# Patient Record
Sex: Female | Born: 1940 | Race: Black or African American | Hispanic: No
Health system: Midwestern US, Community
[De-identification: ages and names within clinical notes are randomized; demographics above are authoritative.]

## PROBLEM LIST (undated history)

## (undated) DIAGNOSIS — K429 Umbilical hernia without obstruction or gangrene: Secondary | ICD-10-CM

## (undated) DIAGNOSIS — I10 Essential (primary) hypertension: Secondary | ICD-10-CM

## (undated) DIAGNOSIS — E1165 Type 2 diabetes mellitus with hyperglycemia: Secondary | ICD-10-CM

## (undated) DIAGNOSIS — E119 Type 2 diabetes mellitus without complications: Secondary | ICD-10-CM

## (undated) DIAGNOSIS — G894 Chronic pain syndrome: Secondary | ICD-10-CM

## (undated) DIAGNOSIS — IMO0002 Reserved for concepts with insufficient information to code with codable children: Secondary | ICD-10-CM

## (undated) DIAGNOSIS — L98499 Non-pressure chronic ulcer of skin of other sites with unspecified severity: Secondary | ICD-10-CM

## (undated) DIAGNOSIS — R809 Proteinuria, unspecified: Secondary | ICD-10-CM

## (undated) DIAGNOSIS — E041 Nontoxic single thyroid nodule: Secondary | ICD-10-CM

## (undated) DIAGNOSIS — K219 Gastro-esophageal reflux disease without esophagitis: Secondary | ICD-10-CM

## (undated) DIAGNOSIS — I2699 Other pulmonary embolism without acute cor pulmonale: Secondary | ICD-10-CM

## (undated) DIAGNOSIS — R21 Rash and other nonspecific skin eruption: Secondary | ICD-10-CM

## (undated) DIAGNOSIS — Z1231 Encounter for screening mammogram for malignant neoplasm of breast: Secondary | ICD-10-CM

## (undated) DIAGNOSIS — I272 Pulmonary hypertension, unspecified: Secondary | ICD-10-CM

## (undated) DIAGNOSIS — Z1211 Encounter for screening for malignant neoplasm of colon: Secondary | ICD-10-CM

## (undated) DIAGNOSIS — M5430 Sciatica, unspecified side: Secondary | ICD-10-CM

## (undated) DIAGNOSIS — J449 Chronic obstructive pulmonary disease, unspecified: Secondary | ICD-10-CM

## (undated) DIAGNOSIS — R109 Unspecified abdominal pain: Secondary | ICD-10-CM

## (undated) DIAGNOSIS — M899 Disorder of bone, unspecified: Secondary | ICD-10-CM

## (undated) DIAGNOSIS — E042 Nontoxic multinodular goiter: Secondary | ICD-10-CM

## (undated) DIAGNOSIS — M810 Age-related osteoporosis without current pathological fracture: Secondary | ICD-10-CM

## (undated) DIAGNOSIS — M25551 Pain in right hip: Secondary | ICD-10-CM

## (undated) DIAGNOSIS — M949 Disorder of cartilage, unspecified: Secondary | ICD-10-CM

## (undated) DIAGNOSIS — R591 Generalized enlarged lymph nodes: Secondary | ICD-10-CM

## (undated) DIAGNOSIS — B372 Candidiasis of skin and nail: Secondary | ICD-10-CM

## (undated) DIAGNOSIS — R52 Pain, unspecified: Secondary | ICD-10-CM

## (undated) DIAGNOSIS — G8929 Other chronic pain: Secondary | ICD-10-CM

## (undated) DIAGNOSIS — R928 Other abnormal and inconclusive findings on diagnostic imaging of breast: Secondary | ICD-10-CM

## (undated) DIAGNOSIS — E11622 Type 2 diabetes mellitus with other skin ulcer: Secondary | ICD-10-CM

## (undated) DIAGNOSIS — R0600 Dyspnea, unspecified: Secondary | ICD-10-CM

## (undated) DIAGNOSIS — D649 Anemia, unspecified: Secondary | ICD-10-CM

## (undated) DIAGNOSIS — J189 Pneumonia, unspecified organism: Secondary | ICD-10-CM

## (undated) DIAGNOSIS — M1990 Unspecified osteoarthritis, unspecified site: Secondary | ICD-10-CM

## (undated) DIAGNOSIS — M419 Scoliosis, unspecified: Secondary | ICD-10-CM

## (undated) DIAGNOSIS — E785 Hyperlipidemia, unspecified: Secondary | ICD-10-CM

## (undated) DIAGNOSIS — Z8719 Personal history of other diseases of the digestive system: Secondary | ICD-10-CM

## (undated) DIAGNOSIS — Z8619 Personal history of other infectious and parasitic diseases: Secondary | ICD-10-CM

## (undated) DIAGNOSIS — J45909 Unspecified asthma, uncomplicated: Secondary | ICD-10-CM

## (undated) DIAGNOSIS — Z8669 Personal history of other diseases of the nervous system and sense organs: Secondary | ICD-10-CM

## (undated) DIAGNOSIS — G43909 Migraine, unspecified, not intractable, without status migrainosus: Secondary | ICD-10-CM

## (undated) DIAGNOSIS — M79671 Pain in right foot: Secondary | ICD-10-CM

## (undated) DIAGNOSIS — I890 Lymphedema, not elsewhere classified: Secondary | ICD-10-CM

## (undated) HISTORY — DX: Essential (primary) hypertension: I10

## (undated) HISTORY — DX: Migraine, unspecified, not intractable, without status migrainosus: G43.909

## (undated) HISTORY — DX: Personal history of other infectious and parasitic diseases: Z86.19

## (undated) HISTORY — PX: TONSILLECTOMY: SUR1361

## (undated) HISTORY — DX: Personal history of other diseases of the nervous system and sense organs: Z86.69

## (undated) HISTORY — DX: Hyperlipidemia, unspecified: E78.5

## (undated) HISTORY — PX: VESICOVAGINAL FISTULA CLOSURE W/ TAH: SUR271

## (undated) HISTORY — DX: Unspecified asthma, uncomplicated: J45.909

## (undated) HISTORY — DX: Scoliosis, unspecified: M41.9

## (undated) MED ORDER — DICLOFENAC 75 MG TAB, DELAYED RELEASE
75 mg | ORAL_TABLET | ORAL | Status: DC
Start: ? — End: 2012-07-25

## (undated) MED ORDER — LORATADINE 10 MG TAB
10 mg | ORAL_TABLET | Freq: Every day | ORAL | Status: DC
Start: ? — End: 2012-12-04

## (undated) MED ORDER — IPRATROPIUM-ALBUTEROL 20 MCG-100 MCG/ACTUATION AEROSOL INHALER
20-100 mcg/actuation | Freq: Four times a day (QID) | RESPIRATORY_TRACT | Status: DC
Start: ? — End: 2012-05-29

## (undated) MED ORDER — JANUMET 50 MG-1,000 MG TABLET
50-1000 mg | ORAL_TABLET | ORAL | Status: DC
Start: ? — End: 2013-04-02

## (undated) MED ORDER — DICLOFENAC 75 MG TAB, DELAYED RELEASE
75 mg | ORAL_TABLET | ORAL | Status: DC
Start: ? — End: 2012-10-11

## (undated) MED ORDER — JANUMET 50 MG-1,000 MG TABLET
50-1000 mg | ORAL_TABLET | ORAL | Status: DC
Start: ? — End: 2012-05-29

## (undated) MED ORDER — IPRATROPIUM-ALBUTEROL 20 MCG-100 MCG/ACTUATION AEROSOL INHALER
20-100 mcg/actuation | Freq: Four times a day (QID) | RESPIRATORY_TRACT | Status: DC
Start: ? — End: 2013-11-11

## (undated) MED ORDER — GABAPENTIN 100 MG CAP
100 mg | ORAL_CAPSULE | Freq: Three times a day (TID) | ORAL | Status: DC
Start: ? — End: 2012-10-28

## (undated) MED ORDER — DICLOFENAC 75 MG TAB, DELAYED RELEASE
75 mg | ORAL_TABLET | ORAL | Status: DC
Start: ? — End: 2013-04-29

---

## 1997-12-29 ENCOUNTER — Other Ambulatory Visit: Admission: RE | Admit: 1997-12-29 | Discharge: 1997-12-29 | Payer: Self-pay | Admitting: *Deleted

## 1998-09-09 ENCOUNTER — Encounter: Payer: Self-pay | Admitting: *Deleted

## 1998-09-10 ENCOUNTER — Inpatient Hospital Stay (HOSPITAL_COMMUNITY): Admission: EM | Admit: 1998-09-10 | Discharge: 1998-09-15 | Payer: Self-pay | Admitting: *Deleted

## 1998-09-14 ENCOUNTER — Encounter: Payer: Self-pay | Admitting: Cardiology

## 1998-12-13 ENCOUNTER — Other Ambulatory Visit: Admission: RE | Admit: 1998-12-13 | Discharge: 1998-12-13 | Payer: Self-pay | Admitting: *Deleted

## 2000-01-24 ENCOUNTER — Other Ambulatory Visit: Admission: RE | Admit: 2000-01-24 | Discharge: 2000-01-24 | Payer: Self-pay | Admitting: *Deleted

## 2000-05-01 ENCOUNTER — Ambulatory Visit (HOSPITAL_COMMUNITY): Admission: RE | Admit: 2000-05-01 | Discharge: 2000-05-01 | Payer: Self-pay | Admitting: *Deleted

## 2001-04-09 ENCOUNTER — Other Ambulatory Visit: Admission: RE | Admit: 2001-04-09 | Discharge: 2001-04-09 | Payer: Self-pay | Admitting: *Deleted

## 2002-04-15 ENCOUNTER — Other Ambulatory Visit: Admission: RE | Admit: 2002-04-15 | Discharge: 2002-04-15 | Payer: Self-pay | Admitting: *Deleted

## 2002-07-16 ENCOUNTER — Emergency Department (HOSPITAL_COMMUNITY): Admission: EM | Admit: 2002-07-16 | Discharge: 2002-07-16 | Payer: Self-pay | Admitting: Emergency Medicine

## 2002-07-16 ENCOUNTER — Encounter: Payer: Self-pay | Admitting: Emergency Medicine

## 2003-04-21 ENCOUNTER — Other Ambulatory Visit: Admission: RE | Admit: 2003-04-21 | Discharge: 2003-04-21 | Payer: Self-pay | Admitting: *Deleted

## 2004-06-11 ENCOUNTER — Emergency Department (HOSPITAL_COMMUNITY): Admission: EM | Admit: 2004-06-11 | Discharge: 2004-06-12 | Payer: Self-pay | Admitting: Emergency Medicine

## 2005-05-24 ENCOUNTER — Ambulatory Visit (HOSPITAL_COMMUNITY): Admission: RE | Admit: 2005-05-24 | Discharge: 2005-05-24 | Payer: Self-pay | Admitting: *Deleted

## 2006-01-04 ENCOUNTER — Other Ambulatory Visit: Admission: RE | Admit: 2006-01-04 | Discharge: 2006-01-04 | Payer: Self-pay | Admitting: *Deleted

## 2006-07-18 ENCOUNTER — Encounter: Admission: RE | Admit: 2006-07-18 | Discharge: 2006-07-18 | Payer: Self-pay | Admitting: Internal Medicine

## 2007-09-09 ENCOUNTER — Encounter: Admission: RE | Admit: 2007-09-09 | Discharge: 2007-09-09 | Payer: Self-pay | Admitting: Internal Medicine

## 2008-01-03 NOTE — Op Note (Signed)
Name: Allison Newman, ADAN  MR #: [id number] Surgeon: Cristine Polio,   MD  Account #: [id number] Surgery Date: 01/03/2008  DOB: [birth year]  Age: 67 Location:     OPERATIVE REPORT        PREOPERATIVE DIAGNOSIS: Polyp.    POSTOPERATIVE DIAGNOSES:  1. Transverse colon polyp.  2. Internal hemorrhoids.    PROCEDURES PERFORMED: Colonoscopy and excisional polypectomy.    ANESTHESIA RISK: 2.    AIRWAY CLASS: No airway problems anticipated. H and P completed prior to  procedure.    MEDICATIONS:  1. Fentanyl 100 mcg.  2. Versed 4 mg.    MONITORING: Conscious anesthesia was monitored including O2 saturation,  blood pressure, airway, and cardiac rhythm by the endoscopy nurse and  physician. There were no complications. Note was made of unifocal PVCs.    INDICATION: The patient desired to exclude significant colon pathology.    CONSENT: The patient was advised of the risks including perforation of the  colon requiring emergent surgery to correct, benefits, and alternatives.  The patient signed the informed consent and agreed to proceed.    DESCRIPTION OF PROCEDURE: The patient was placed in left lateral decubitus  position. Rectal examination was unremarkable. Under direct  visualization, the Olympus CFQ-160 video colonoscope was passed slowly,  advanced to the cecum, from the rectum to the cecum. In the cecum, a large  amount stool was present. The ileocecal valve was unremarkable.  Withdrawal of the scope revealed a tiny polyp in the transverse colon,  which was excisional biopsied. A large amount of stool throughout the  entire portion of the colon examined, making finding mucosal detail more  difficult. Retroflexion of the scope revealed internal hemorrhoids. No  other mucosal lesions, masses, or polyps were seen. The insufflated air  was removed. The scope was removed. Postprocedure abdominal examination  was benign. The patient tolerated the procedure well and was taken to  recovery area in satisfactory condition.     RECOMMENDATIONS: Repeat colonoscopy in 5 years unless clinically indicated  with a 2-day prep.              E-Signed By  Cristine Polio, MD 01/08/2008 09:06  Cristine Polio, MD    cc: Cristine Polio, MD        REH/FI4; D: 01/03/2008 11:35 A; T: 01/03/2008 7:50 P; Doc# [id number]; Job#  [id number]

## 2008-01-03 NOTE — Op Note (Signed)
Op Notes signed by  at 01/08/08 0906                  Author: Carol Ada, MD  Service: --  Author Type: Physician            Filed:   Date of Service: 01/03/08 1950  Status: Signed          <!--EPICS--> Name:      Allison Newman, Allison Newman<BR> MR #:      [id number]                    Surgeon:        Cristine Polio, <BR> MD<BR> Account #:  [id number]                 Surgery Date:   01/03/2008<BR> DOB:       1940/05/05<BR> Age:       67                           Location:<BR> <BR>                              OPERATIVE REPORT<BR> <BR> <BR> <BR> PREOPERATIVE DIAGNOSIS:  Polyp.<BR> <BR>  POSTOPERATIVE DIAGNOSES:<BR> 1.     Transverse colon polyp.<BR> 2.     Internal hemorrhoids.<BR> <BR> PROCEDURES PERFORMED:  Colonoscopy and excisional polypectomy.<BR> <BR> ANESTHESIA RISK:  2.<BR> <BR> AIRWAY CLASS:  No airway problems anticipated.   H and P completed prior to<BR> procedure.<BR> <BR> MEDICATIONS:<BR> 1.     Fentanyl 100 mcg.<BR> 2.     Versed 4 mg.<BR> <BR> MONITORING:  Conscious anesthesia was monitored including O2 saturation,<BR> blood pressure, airway, and cardiac rhythm by the  endoscopy nurse and<BR> physician.  There were no complications.  Note was made of unifocal PVCs.<BR> <BR> INDICATION:  The patient desired to exclude significant colon pathology.<BR> <BR> CONSENT:  The patient was advised of the risks including perforation  of the<BR> colon requiring emergent surgery to correct, benefits, and alternatives.<BR> The patient signed the informed consent and agreed to proceed.<BR> <BR> DESCRIPTION OF PROCEDURE:  The patient was placed in left lateral decubitus<BR> position.   Rectal examination was unremarkable.  Under direct<BR> visualization, the Olympus CFQ-160 video colonoscope was passed slowly,<BR> advanced to the cecum, from the rectum to the cecum.  In the cecum, a large<BR> amount stool was present.  The ileocecal  valve was unremarkable.<BR> Withdrawal of the scope revealed a tiny polyp  in the transverse colon,<BR> which was excisional biopsied.  A large amount of stool throughout the<BR> entire portion of the colon examined, making finding mucosal detail more<BR>  difficult.  Retroflexion of the scope revealed internal hemorrhoids.  No<BR> other mucosal lesions, masses, or polyps were seen.  The insufflated air<BR> was removed.  The scope was removed.  Postprocedure abdominal examination<BR> was benign.  The patient  tolerated the procedure well and was taken to<BR> recovery area in satisfactory condition.<BR> <BR> RECOMMENDATIONS:  Repeat colonoscopy in 5 years unless clinically indicated<BR> with a 2-day prep.<BR> <BR> <BR> <BR> <BR> <BR> <BR> E-Signed By<BR> Cristine Polio, MD 01/08/2008 09:06<BR> Patrecia Veiga E Mason Burleigh, MD<BR> <BR> cc:   Desmond Dike Pantera Winterrowd, MD<BR> <BR> <BR> <BR> REH/FI4; D: 01/03/2008 11:35 A; T: 01/03/2008  7:50 P; Doc# [id number]; Job#<BR> [id number]<NA> <!--EPICE-->

## 2008-01-07 LAB — GLUCOSE, POC: Glucose (POC): 143 mg/dL — ABNORMAL HIGH (ref 65–105)

## 2009-11-01 ENCOUNTER — Encounter: Admission: RE | Admit: 2009-11-01 | Discharge: 2009-11-01 | Payer: Self-pay | Admitting: Internal Medicine

## 2009-11-02 ENCOUNTER — Encounter: Admission: RE | Admit: 2009-11-02 | Discharge: 2009-11-02 | Payer: Self-pay | Admitting: Internal Medicine

## 2010-04-27 ENCOUNTER — Emergency Department (HOSPITAL_COMMUNITY): Payer: Medicare Other

## 2010-04-27 ENCOUNTER — Observation Stay (HOSPITAL_COMMUNITY)
Admission: EM | Admit: 2010-04-27 | Discharge: 2010-04-29 | Disposition: A | Discharge status: Home / Self Care | Payer: Medicare Other | Attending: Internal Medicine | Admitting: Internal Medicine

## 2010-04-27 DIAGNOSIS — Z7902 Long term (current) use of antithrombotics/antiplatelets: Secondary | ICD-10-CM | POA: Insufficient documentation

## 2010-04-27 DIAGNOSIS — Z7982 Long term (current) use of aspirin: Secondary | ICD-10-CM | POA: Insufficient documentation

## 2010-04-27 DIAGNOSIS — I10 Essential (primary) hypertension: Secondary | ICD-10-CM | POA: Insufficient documentation

## 2010-04-27 DIAGNOSIS — E119 Type 2 diabetes mellitus without complications: Secondary | ICD-10-CM | POA: Insufficient documentation

## 2010-04-27 DIAGNOSIS — G459 Transient cerebral ischemic attack, unspecified: Secondary | ICD-10-CM | POA: Insufficient documentation

## 2010-04-27 DIAGNOSIS — R209 Unspecified disturbances of skin sensation: Secondary | ICD-10-CM | POA: Insufficient documentation

## 2010-04-27 DIAGNOSIS — E7800 Pure hypercholesterolemia, unspecified: Secondary | ICD-10-CM | POA: Insufficient documentation

## 2010-04-27 DIAGNOSIS — R079 Chest pain, unspecified: Secondary | ICD-10-CM | POA: Insufficient documentation

## 2010-04-27 DIAGNOSIS — R42 Dizziness and giddiness: Principal | ICD-10-CM | POA: Insufficient documentation

## 2010-04-27 LAB — DIFFERENTIAL
Basophils Absolute: 0 10*3/uL (ref 0.0–0.1)
Basophils Relative: 0 % (ref 0–1)
Eosinophils Absolute: 0.1 10*3/uL (ref 0.0–0.7)
Eosinophils Relative: 1 % (ref 0–5)
Lymphocytes Relative: 23 % (ref 12–46)
Lymphs Abs: 2.1 10*3/uL (ref 0.7–4.0)
Monocytes Absolute: 0.5 10*3/uL (ref 0.1–1.0)
Monocytes Relative: 6 % (ref 3–12)
Neutro Abs: 6.5 10*3/uL (ref 1.7–7.7)
Neutrophils Relative %: 70 % (ref 43–77)

## 2010-04-27 LAB — COMPREHENSIVE METABOLIC PANEL
ALT: 12 U/L (ref 0–35)
AST: 22 U/L (ref 0–37)
Albumin: 3.6 g/dL (ref 3.5–5.2)
Alkaline Phosphatase: 64 U/L (ref 39–117)
BUN: 22 mg/dL (ref 6–23)
CO2: 34 mEq/L — ABNORMAL HIGH (ref 19–32)
Calcium: 10 mg/dL (ref 8.4–10.5)
Chloride: 99 mEq/L (ref 96–112)
Creatinine, Ser: 1.09 mg/dL (ref 0.4–1.2)
GFR calc Af Amer: >60 mL/min (ref >60)
GFR calc non Af Amer: 50 mL/min — ABNORMAL LOW (ref >60)
Glucose, Bld: 90 mg/dL (ref 70–99)
Potassium: 4.2 mEq/L (ref 3.5–5.1)
Sodium: 141 mEq/L (ref 135–145)
Total Bilirubin: 0.9 mg/dL (ref 0.3–1.2)
Total Protein: 7.6 g/dL (ref 6.0–8.3)

## 2010-04-27 LAB — BASIC METABOLIC PANEL
BUN: 21 mg/dL (ref 6–23)
CO2: 33 mEq/L — ABNORMAL HIGH (ref 19–32)
Calcium: 10.1 mg/dL (ref 8.4–10.5)
Chloride: 97 mEq/L (ref 96–112)
Creatinine, Ser: 1.12 mg/dL (ref 0.4–1.2)
GFR calc Af Amer: 59 mL/min — ABNORMAL LOW (ref >60)
GFR calc non Af Amer: 49 mL/min — ABNORMAL LOW (ref >60)
Glucose, Bld: 95 mg/dL (ref 70–99)
Potassium: 3.2 mEq/L — ABNORMAL LOW (ref 3.5–5.1)
Sodium: 141 mEq/L (ref 135–145)

## 2010-04-27 LAB — CBC
HCT: 39.9 % (ref 36.0–46.0)
Hemoglobin: 12.7 g/dL (ref 12.0–15.0)
MCH: 23 pg — ABNORMAL LOW (ref 26.0–34.0)
MCHC: 31.8 g/dL (ref 30.0–36.0)
MCV: 72.4 fL — ABNORMAL LOW (ref 78.0–100.0)
Platelets: 188 10*3/uL (ref 150–400)
RBC: 5.51 MIL/uL — ABNORMAL HIGH (ref 3.87–5.11)
RDW: 14 % (ref 11.5–15.5)
WBC: 9.2 10*3/uL (ref 4.0–10.5)

## 2010-04-27 LAB — PROTIME-INR
INR: 0.99 (ref 0.00–1.49)
Prothrombin Time: 13.3 seconds (ref 11.6–15.2)

## 2010-04-27 LAB — TROPONIN I: Troponin I: 0.02 ng/mL (ref 0.00–0.06)

## 2010-04-27 LAB — CK TOTAL AND CKMB (NOT AT ARMC)
CK, MB: 1.6 ng/mL (ref 0.3–4.0)
Relative Index: 1.5 (ref 0.0–2.5)
Total CK: 107 U/L (ref 7–177)

## 2010-04-27 LAB — APTT: aPTT: 26 seconds (ref 24–37)

## 2010-04-27 LAB — HEMOGLOBIN A1C
Hgb A1c MFr Bld: 6.3 % — ABNORMAL HIGH (ref <5.7)
Mean Plasma Glucose: 134 mg/dL — ABNORMAL HIGH (ref <117)

## 2010-04-28 ENCOUNTER — Observation Stay (HOSPITAL_COMMUNITY): Payer: Medicare Other

## 2010-04-28 DIAGNOSIS — G459 Transient cerebral ischemic attack, unspecified: Secondary | ICD-10-CM

## 2010-04-28 DIAGNOSIS — I369 Nonrheumatic tricuspid valve disorder, unspecified: Secondary | ICD-10-CM

## 2010-04-28 LAB — LIPID PANEL
Cholesterol: 124 mg/dL (ref 0–200)
HDL: 39 mg/dL — ABNORMAL LOW (ref >39)
LDL Cholesterol: 69 mg/dL (ref 0–99)
Total CHOL/HDL Ratio: 3.2 RATIO
Triglycerides: 81 mg/dL (ref <150)
VLDL: 16 mg/dL (ref 0–40)

## 2010-04-30 NOTE — H&P (Signed)
NAME:  Margaret Gordon, Margaret Gordon NO.:  [id number]  MEDICAL RECORD NO.:  [id number]           PATIENT TYPE:  E  LOCATION:  [facility]                         FACILITY:  [facility]  PHYSICIAN:  Vania Rea, M.D. DATE OF BIRTH:  21-May-1940  DATE OF ADMISSION:  04/27/2010 DATE OF DISCHARGE:                             HISTORY & PHYSICAL   PRIMARY CARE PHYSICIAN:  Deirdre Peer. Polite, MD  CHIEF COMPLAINT:  Numbness and tingling of the right arm and chest pain episodically for the past 2 weeks.  HISTORY OF PRESENT ILLNESS:  This is a 70 year old retired [place] American lady who is very active in various activities in her church and has been listening to television programs recently about [employer] and recognized some of the symptoms she is hearing about on television. Symptoms she has been having as possible heart disease.  The patient says she has been having episodic chest pains on and off, episodic shortness of breath and when she heard the mentioned yesterday that heart disease can sometimes be caused by tingling of the right arm, numbness and tingling of the right arm in women, she realized that she has been having tingling of the right arm on and off since yesterday and decided to come to the emergency room today.  In the emergency room today, the patient had an episode of again tingling of the right arm and also experienced something that she described as tingling of the right side of her face and felt as if her face was leaning toward the left. She also had an episode of tingling of the right shoulder.  She was seen and evaluated by the emergency room physician.  No acute abnormalities were found.  The hospitalist service was called to assist with management to rule out a possible TIA.  The patient's husband and other family members are at the bedside and they described her as being extremely stressed and overworking herself. When the patient was told that we will be  investigating the symptoms thoroughly, but it seems unlikely to be related to a heart attack or stroke, she became very nervous and very tearful and had to be reassured.  Family indicated they think the best thing for her would be to take away her cell phone and allow her to get some rest.  The patient denies any headache.  She denies any diaphoresis.  She denies any lower extremity edema.  She reports that she takes Lasix from her primary care physician because of an episode of an abnormal chest x- ray that was felt to be fluid on her chest and that recently her dose of Lasix has been adjusted downwards.  She denies any syncope.  PAST MEDICAL HISTORY:  Hypertension, hyperlipidemia.  MEDICATIONS:  At bedtime medications are for hypertension, she cannot remember the name. 1. Aspirin 81 mg daily. 2. Lasix 20 mg daily. 3. Lipitor unknown dose daily.  PAST SURGICAL HISTORY:  Hysterectomy and tonsillectomy.  ALLERGIES:  No known drug allergies.  SOCIAL HISTORY:  Denies tobacco, alcohol, or illicit drug use.  Lives with her husband, very active in her church and in the local [place]  woman's association.  She is a retired from [employer] as a Engineer, drilling.  FAMILY HISTORY:  Significant for a mother who had diabetes, congestive heart failure, breast cancer.  Brother with diabetes.  Sister was deceased had diabetes and stroke.  REVIEW OF SYSTEMS:  Other than noted above unremarkable.  PHYSICAL EXAMINATION:  GENERAL:  Anxious but very pleasant middle-aged [place] American lady, looks younger than her stated age. VITAL SIGNS:  Temperature is 97.9.  Her pulse is 73, respiration 20, blood pressure 102/63, saturating 100% on room air. HEENT:  Her pupils are round, equal, and reactive.  Mucous membranes pink.  Anicteric. NECK:  No cervical lymphadenopathy or thyromegaly.  No jugular venous distention.  No carotid bruit. CHEST:  Fine. ABDOMEN:  Obese, soft,  nontender. EXTREMITIES:  Arthritic deformities of the knees, but no edema. Dorsalis pedis pulses 2+ bilaterally. CENTRAL NERVOUS SYSTEM:  Cranial nerves II through XII are grossly intact.  She has no focal lateralizing signs.  LABORATORY DATA:  White count is 9.2, hemoglobin 12.7, MCV is 72.4, platelets 188.  She has a normal differential.  Her sodium is 141.  Her potassium is low at 3.2, chloride 97, CO2 of 33, glucose 95, BUN 21, creatinine 1.1, calcium 10.1.  CT scan of her head without contrast showed no acute abnormality within normal limits for age.  EKG shows normal sinus rhythm without probable left atrial abnormality.  ASSESSMENT: 1. Anxiety disorder. 2. Possible transient ischemic attack. 3. Possible coronary artery disease. 4. Hypertension. 5. Hyperlipidemia. 6. Questionable congestive heart failure.  PLAN: 1. We will bring this lady on observation on a TIA workup protocol.     We will get an MRA to be completely sure she is not having some     episodic neurological abnormality. 2. We will cycle her cardiac enzymes and have her on a cardiac     monitor. 3. She will get a 2-D echo as part of the TIA workup and this will     help to establish whether she does in fact have diastolic or     systolic heart failure. 4. We will continue her home medications, but will need the pharmacist     to help [place] to get her exact doses and her antihypertensive     medications. 5. I have made some recommendations to this lady as to how she can     deal with stress, particularly she has been recommended to     transcendental meditation which she sounds very interested in. 6. This is an observation admission.     Vania Rea, M.D.     LC/MEDQ  D:  04/27/2010  T:  04/27/2010  Job:  [id number]  cc:   Deirdre Peer. Polite, M.D.  Electronically Signed by Vania Rea M.D. on 04/30/2010 05:00:34 AM

## 2010-05-11 NOTE — Discharge Summary (Addendum)
NAME:  Margaret Gordon, Margaret Gordon NO.:  [id number]  MEDICAL RECORD NO.:  [id number]           PATIENT TYPE:  I  LOCATION:  3032                         FACILITY:  [facility]  PHYSICIAN:  Deirdre Peer. Polite, M.D. DATE OF BIRTH:  1940/05/21  DATE OF ADMISSION:  04/27/2010 DATE OF DISCHARGE:  04/29/2010                              DISCHARGE SUMMARY   DISCHARGE DIAGNOSES: 1. Dizziness rule out transient ischemic attack versus cerebrovascular     accident.  CT of the head within normal limits.  MRI, MRA of the     brain, no acute or reversible findings.  MRA, normal intracranial.     MR angiography of the large and medium size vessels.  Carotid     Doppler, no significant extracranial carotid artery stenosis     demonstrated.  Vertebrals are patent, antegrade flow.  A 2-D echo,     preserved left ventricular function.  The patient discharged in     stable condition. 2. Hypertension well controlled without meds, discharged home without     meds, will have further outpatient followup. 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 4. Hypercholesterolemia, well controlled. 5. Overweight. 6. Probable anxiety disorder.  DISCHARGE MEDICATIONS:  Aspirin 81 mg daily, Lipitor 10 mg daily.  DISPOSITION:  Discharged to home in stable condition, asked to follow up with primary MD in 1 week.  HISTORY OF PRESENT ILLNESS:  A 70 year old female presented to the hospital with numbness and tingling of the right arm and episodic chest pain 2 weeks.  In the ED, the patient was evaluated, felt to be having significant underlying stress, however, because of her age and risk factors admission was deemed necessary for further evaluation to rule out CVA versus acute coronary syndrome.  Please see dictated H&P for further details.  PAST MEDICAL HISTORY/MEDICATIONS/SOCIAL HISTORY/PAST  SURGICAL HISTORY/ALLERGIES/FAMILY HISTORY:  Per admission H&P.  HOSPITAL COURSE:  The patient was admitted to telemetry floor bed for evaluation and treatment.  The patient underwent neuro evaluation as well as a cardiac workup which included serial cardiac enzymes which were negative for ischemia.  A 2-D echo which showed preserved EF, no wall motion abnormality.  In reference to her CVA workup, studies as stated above.  The patient had a normal CT, normal MRI, MRA, normal carotid Doppler, and again a normal 2-D echo.  Exam was normal throughout her hospitalization.  It was felt that the patient was having some underlying anxiety leading to her symptoms.  The patient's labs did reveal an elevated glucose and a A1c of 6.3.  She will be asked to start aggressive lifestyle modifications, we will consider metformin on a outpatient basis. The patient has other medical problems of hyperlipidemia which is well controlled and hypertension which was well controlled in the hospital despite medication.  Therefore, she was discharged home without her meds.  The patient was discharged home in stable condition and asked to follow up with primary MD.     Windy Fast  D. Polite, M.D.     RDP/MEDQ  D:  05/02/2010  T:  05/03/2010  Job:  [id number]  Electronically Signed by Windy Fast POLITE M.D. on 05/10/2010 05:22:43 PM Electronically Signed by Windy Fast POLITE M.D. on 05/10/2010 05:24:33 PM Electronically Signed by Windy Fast POLITE M.D. on 05/10/2010 05:24:33 PM Electronically Signed by Windy Fast POLITE M.D. on 05/10/2010 05:27:52 PM Electronically Signed by Windy Fast POLITE M.D. on 05/10/2010 05:27:52 PM Electronically Signed by Windy Fast POLITE M.D. on 05/10/2010 06:09:52 PM Electronically Signed by Windy Fast POLITE M.D. on 05/10/2010 06:18:51 PM Electronically Signed by Windy Fast POLITE M.D. on 05/10/2010 07:36:58 PM

## 2010-07-22 NOTE — Procedures (Signed)
Margaret Gordon. [facility]  Patient:    Margaret Gordon, Margaret Gordon                     MRN: [id number] Adm. Date:  [id number] Attending:  Sabino Gasser                           Procedure Report  PROCEDURE:  Colonoscopy.  INDICATIONS:  Rectal bleeding.  ANESTHESIA:  Demerol 75 mg, Versed 6 mg.  DESCRIPTION OF PROCEDURE:  With the patient mildly sedated in the left lateral decubitus position, the Olympus videoscopic variable-stiffness pediatric colonoscope was inserted in the rectum and passed under direct vision to the cecum, identified by ileocecal valve and appendiceal orifice, both of which were photographed.  From this point, the colonoscope was slowly withdrawn, taking circumferential views of the entire colonic mucosa, stopping only in the rectum, which appeared normal on direct view and showed internal hemorrhoids on retroflex view and pull through the anal canal.  Photographs were taken.  The endoscope was withdrawn.  The patients vital signs and pulse oximetry remained stable.  The patient tolerated the procedure well without apparent complications.  FINDINGS:  Internal hemorrhoids, otherwise unremarkable colonoscopic examination to the cecum.  PLAN:  Repeat examination in possibly five to 10 years. DD:  05/01/00 TD:  05/01/00 Job: [id number]

## 2010-07-22 NOTE — Op Note (Signed)
NAME:  Margaret Gordon, HUBERS NO.:  [id number]   MEDICAL RECORD NO.:  [id number]          PATIENT TYPE:  AMB   LOCATION:  ENDO                         FACILITY:  [facility]   PHYSICIAN:  Georgiana Spinner, M.D.    DATE OF BIRTH:  [birth year]   DATE OF PROCEDURE:  05/24/2005  DATE OF DISCHARGE:                                 OPERATIVE REPORT   PROCEDURE:  Colonoscopy.   INDICATIONS:  Rectal bleeding.   ANESTHESIA:  Fentanyl 100 mcg, Versed 4 milligrams.   PROCEDURE:  With the patient mildly sedated in the left lateral decubitus  position, the Olympus videoscopic colonoscope PCF 160 was inserted into the  rectum and passed under direct vision with pressure applied.  We reached the  cecum as identified by ileocecal valve and the base of the cecum both which  were photographed.  From this point colonoscope was slowly withdrawn taking  circumferential views of colonic mucosa stopping in the rectum which  appeared normal on direct, showed hemorrhoids on retroflexed view.  The  endoscope was straightened and withdrawn. The patient's vital signs, pulse  oximeter remained stable. The patient tolerated procedure well without  apparent complications.   FINDINGS:  Internal hemorrhoids, otherwise unremarkable colonoscopic  examination of the tortuous colon.   PLAN:  Consider repeat examination possibly in 5 years           ______________________________  Georgiana Spinner, M.D.     GMO/MEDQ  D:  05/24/2005  T:  05/25/2005  Job:  [id number]

## 2010-11-15 ENCOUNTER — Other Ambulatory Visit: Payer: Self-pay | Admitting: Internal Medicine

## 2010-11-15 DIAGNOSIS — Z1231 Encounter for screening mammogram for malignant neoplasm of breast: Secondary | ICD-10-CM

## 2010-12-01 ENCOUNTER — Ambulatory Visit
Admission: RE | Admit: 2010-12-01 | Discharge: 2010-12-01 | Disposition: A | Discharge status: Home / Self Care | Payer: Medicare Other | Source: Ambulatory Visit | Attending: Internal Medicine | Admitting: Internal Medicine

## 2010-12-01 DIAGNOSIS — Z1231 Encounter for screening mammogram for malignant neoplasm of breast: Secondary | ICD-10-CM

## 2010-12-27 ENCOUNTER — Inpatient Hospital Stay
Admit: 2010-12-27 | Discharge: 2010-12-28 | Disposition: A | Discharge status: Home / Self Care | Payer: MEDICARE | Attending: Internal Medicine

## 2010-12-27 DIAGNOSIS — R1011 Right upper quadrant pain: Secondary | ICD-10-CM

## 2010-12-27 LAB — URINALYSIS W/ REFLEX CULTURE
Bacteria: NEGATIVE /HPF
Bilirubin: NEGATIVE
Blood: NEGATIVE
Glucose: NEGATIVE MG/DL
Ketone: NEGATIVE MG/DL
Leukocyte Esterase: NEGATIVE
Nitrites: NEGATIVE
Protein: NEGATIVE MG/DL
Specific gravity: 1.027 (ref 1.003–1.030)
Urobilinogen: 0.2 EU/DL (ref 0.2–1.0)
pH (UA): 6 (ref 5.0–8.0)

## 2010-12-27 LAB — CBC WITH AUTOMATED DIFF
ABS. BASOPHILS: 0 10*3/uL (ref 0.0–0.1)
ABS. EOSINOPHILS: 0.2 10*3/uL (ref 0.0–0.4)
ABS. LYMPHOCYTES: 2.1 10*3/uL (ref 0.8–3.5)
ABS. MONOCYTES: 0.7 10*3/uL (ref 0.0–1.0)
ABS. NEUTROPHILS: 3.9 10*3/uL (ref 1.8–8.0)
BASOPHILS: 0 % (ref 0–1)
EOSINOPHILS: 2 % (ref 0–7)
HCT: 40.1 % (ref 35.0–47.0)
HGB: 13.1 g/dL (ref 11.5–16.0)
LYMPHOCYTES: 30 % (ref 12–49)
MCH: 28.2 PG (ref 26.0–34.0)
MCHC: 32.7 g/dL (ref 30.0–36.5)
MCV: 86.4 FL (ref 80.0–99.0)
MONOCYTES: 11 % (ref 5–13)
NEUTROPHILS: 57 % (ref 32–75)
PLATELET: 157 10*3/uL (ref 150–400)
RBC: 4.64 M/uL (ref 3.80–5.20)
RDW: 14 % (ref 11.5–14.5)
WBC: 6.9 10*3/uL (ref 3.6–11.0)

## 2010-12-27 LAB — METABOLIC PANEL, COMPREHENSIVE
A-G Ratio: 0.9 — ABNORMAL LOW (ref 1.1–2.2)
ALT (SGPT): 35 U/L (ref 12–78)
AST (SGOT): 19 U/L (ref 15–37)
Albumin: 3.5 g/dL (ref 3.5–5.0)
Alk. phosphatase: 88 U/L (ref 50–136)
Anion gap: 10 mmol/L (ref 5–15)
BUN/Creatinine ratio: 26 — ABNORMAL HIGH (ref 12–20)
BUN: 26 MG/DL — ABNORMAL HIGH (ref 6–20)
Bilirubin, total: 0.5 MG/DL (ref 0.2–1.0)
CO2: 28 MMOL/L (ref 21–32)
Calcium: 8.8 MG/DL (ref 8.5–10.1)
Chloride: 107 MMOL/L (ref 97–108)
Creatinine: 1 MG/DL (ref 0.6–1.3)
GFR est AA: >60 mL/min/{1.73_m2} (ref >60)
GFR est non-AA: 59 mL/min/{1.73_m2} — ABNORMAL LOW (ref >60)
Globulin: 4 g/dL (ref 2.0–4.0)
Glucose: 154 MG/DL — ABNORMAL HIGH (ref 65–100)
Potassium: 4.2 MMOL/L (ref 3.5–5.1)
Protein, total: 7.5 g/dL (ref 6.4–8.2)
Sodium: 145 MMOL/L (ref 136–145)

## 2010-12-27 LAB — LIPASE: Lipase: 122 U/L (ref 73–393)

## 2010-12-27 NOTE — ED Notes (Signed)
Assumed patient care. Pt complaining of RUQ pain x 1 week. Pt said, "The pain in uncomfortable, but sometimes gets sharp." Pt denies nausea and vomiting and said her last bowel movement was normal today. Denies urinary pain pr frequency.

## 2010-12-27 NOTE — ED Notes (Signed)
Report given to Raiford Noble, Charity fundraiser. Pt taken to xray.

## 2010-12-27 NOTE — ED Notes (Signed)
Discussed with the patient and all questioned fully answered. She will call me if any problems arise.

## 2010-12-27 NOTE — ED Provider Notes (Signed)
Patient is a 70 y.o. female presenting with abdominal pain. [name].   Abdominal Pain   This is a recurrent problem. Episode onset: 1wk. Episode frequency: intermittently. The problem has been gradually worsening. The pain is associated with an unknown factor. The pain is located in the RUQ. The quality of the pain is sharp. The pain is at a severity of 5/10. Associated symptoms include belching and flatus. Pertinent negatives include no fever, no diarrhea, no nausea, no vomiting, no dysuria and no frequency. Nothing worsens the pain. The pain is relieved by nothing. Her past medical history is significant for GERD and DM. Past medical history comments: HTN. The patient's surgical history non-contributory.       Past Medical History   Diagnosis Date   ??? Hypertension    ??? Diabetes    ??? Arthritis    ??? Gastrointestinal disorder      GERD        Past Surgical History   Procedure Date   ??? Hx orthopaedic      knee replacement         No family history on file.     History     Social History   ??? Marital Status: Divorced     Spouse Name: N/A     Number of Children: N/A   ??? Years of Education: N/A     Occupational History   ??? Not on file.     Social History Main Topics   ??? Smoking status: Never Smoker    ??? Smokeless tobacco: Not on file   ??? Alcohol Use: No   ??? Drug Use: No   ??? Sexually Active:      Other Topics Concern   ??? Not on file     Social History Narrative   ??? No narrative on file                  ALLERGIES: Review of patient's allergies indicates no known allergies.      Review of Systems   Constitutional: Negative for fever.   Gastrointestinal: Positive for abdominal pain and flatus. Negative for nausea, vomiting and diarrhea.   Genitourinary: Negative for dysuria and frequency.   All other systems reviewed and are negative.        Filed Vitals:    12/27/10 1643   BP: 173/85   Pulse: 81   Temp: 98.2 ??F (36.8 ??C)   Resp: 18   Height: 5\' 3"  (1.6 m)   Weight: 149.687 kg (330 lb)    SpO2: 97%            Physical Exam   Vitals reviewed.  Constitutional: She is oriented to person, place, and time. She appears well-developed and well-nourished. No distress.   HENT:   Head: Normocephalic and atraumatic.   Eyes: Conjunctivae are normal.   Cardiovascular: Normal rate, regular rhythm and normal heart sounds.    Pulmonary/Chest: Effort normal and breath sounds normal. No respiratory distress. She has no wheezes. She has no rales.   Abdominal: Soft. Bowel sounds are normal. Distention: morbidly obese. There is tenderness in the right upper quadrant. There is no rigidity, no guarding, no tenderness at McBurney's point and negative Murphy's sign.   Neurological: She is alert and oriented to person, place, and time.   Skin: Skin is warm and dry.        MDM    Procedures

## 2010-12-28 NOTE — ED Provider Notes (Signed)
I have personally seen and evaluated patient. I find the patient's history and physical exam are consistent with the PA's NP documentation. I agree with the care provided, treatments rendered, disposition and follow up plan.

## 2011-01-11 NOTE — ED Notes (Signed)
Nurse discharging patient did not update pain level post discharge.

## 2011-02-09 ENCOUNTER — Encounter

## 2011-02-14 ENCOUNTER — Encounter

## 2011-08-28 LAB — AMB POC URINALYSIS DIP STICK AUTO W/O MICRO
Bilirubin (UA POC): NEGATIVE
Blood (UA POC): NEGATIVE
Ketones (UA POC): NEGATIVE
Nitrites (UA POC): NEGATIVE
Protein (UA POC): NEGATIVE mg/dL
Specific gravity (UA POC): 1.015 (ref 1.001–1.035)
pH (UA POC): 6 (ref 4.6–8.0)

## 2011-08-28 MED ORDER — GABAPENTIN 100 MG CAP
100 mg | ORAL_CAPSULE | Freq: Three times a day (TID) | ORAL | Status: DC
Start: 2011-08-28 — End: 2011-09-13

## 2011-08-28 MED ORDER — SITAGLIPTIN-METFORMIN 50 MG-1,000 MG TAB
50-1000 mg | ORAL_TABLET | Freq: Two times a day (BID) | ORAL | Status: DC
Start: 2011-08-28 — End: 2011-12-24

## 2011-08-28 NOTE — Progress Notes (Signed)
HISTORY OF PRESENT ILLNESS  Allison Newman is a 71 y.o. female.  HPI  DMtype II    Compliant w/ meds, +diabetic diet, and daily exercise, obtains home glucose monitoring averaging         200's  . No Rf needed for today. Denies any tingling sensation, polyurea and polydipsia,Feeling better since the last visit.     Current Outpatient Prescriptions   Medication Sig Dispense Refill   ??? aspirin 81 mg tablet Take 81 mg by mouth.       ??? sitaGLIPtin-metFORMIN (JANUMET) 50-1,000 mg per tablet Take 1 Tab by mouth two (2) times daily (with meals).  60 Tab  3   ??? gabapentin (NEURONTIN) 100 mg capsule Take 1 Cap by mouth three (3) times daily.  90 Cap  3   ??? amLODIPine (NORVASC) 10 mg tablet Take  by mouth daily.         ??? omeprazole (PRILOSEC) 20 mg capsule Take 20 mg by mouth daily.         ??? triamterene-hydrochlorothiazide (DYAZIDE) 37.5-25 mg per capsule Take  by mouth daily.         ??? simvastatin (ZOCOR) 40 mg tablet Take  by mouth nightly.         ??? diclofenac EC (VOLTAREN) 75 mg EC tablet Take  by mouth.         ??? metoprolol (LOPRESSOR) 25 mg tablet Take  by mouth two (2) times a day.           No Known Allergies  Past Medical History   Diagnosis Date   ??? Hypertension    ??? Diabetes    ??? Arthritis    ??? Gastrointestinal disorder      GERD   ??? 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, adult 08/28/2011   ??? 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     Past Surgical History   Procedure Date   ??? Hx orthopaedic      knee replacement     No family history on file.  History   Substance Use Topics   ??? Smoking status: Never Smoker    ??? Smokeless tobacco: Not on file   ??? Alcohol Use: No        Component Value Date/Time   Creatinine 1.0 12/27/2010  6:50 PM      No results found for this basename: CHOL, [id number], CHOLX, CHOLP, CHLST, CHOLV, HDL, [id number], HDLC, HDLP, LDL, DLDL, LDLC, DLDLP, TGL, TGLX, TRIGL, [id number], TRIGP, CHHD, CHHDX     Review of Systems   Constitutional: Negative for fever and chills.    HENT: Negative for ear pain and nosebleeds.    Eyes: Negative for blurred vision, pain and discharge.   Respiratory: Negative for shortness of breath.    Cardiovascular: Negative for chest pain and leg swelling.   Gastrointestinal: Negative for nausea, vomiting, diarrhea and constipation.   Genitourinary: Negative for frequency.   Musculoskeletal: Negative for joint pain.   Skin: Negative for itching and rash.   Neurological: Negative for headaches.   Psychiatric/Behavioral: Negative for depression. The patient is not nervous/anxious.        Physical Exam   Nursing note and vitals reviewed.  Constitutional: She is oriented to person, place, and time. She appears well-developed and well-nourished.   HENT:   Head: Normocephalic and atraumatic.   Eyes: Conjunctivae and EOM are normal.   Neck: Normal range of motion. Neck supple.   Cardiovascular: Normal rate, regular  rhythm and normal heart sounds.    No murmur heard.  Pulmonary/Chest: Effort normal and breath sounds normal.   Abdominal: Soft. Bowel sounds are normal. She exhibits no distension.   Musculoskeletal: Normal range of motion. She exhibits no edema.   Neurological: She is alert and oriented to person, place, and time.   Skin: No erythema.   Psychiatric: Her behavior is normal.       ASSESSMENT and PLAN  Inette was seen today for establish care and hypertension.    Diagnoses and associated orders for this visit:    Htn, goal below 130/80  - METABOLIC PANEL, COMPREHENSIVE  - CBC W/O DIFF  - LIPID PANEL  - TSH, 3RD GENERATION  - AMB POC URINALYSIS DIP STICK AUTO W/O MICRO  - HEMOGLOBIN A1C  - sitaGLIPtin-metFORMIN (JANUMET) 50-1,000 mg per tablet; Take 1 Tab by mouth two (2) times daily (with meals).    Diabetes mellitus type ii, uncontrolled  - METABOLIC PANEL, COMPREHENSIVE  - CBC W/O DIFF  - LIPID PANEL  - TSH, 3RD GENERATION  - AMB POC URINALYSIS DIP STICK AUTO W/O MICRO  - HEMOGLOBIN A1C  - sitaGLIPtin-metFORMIN (JANUMET) 50-1,000 mg per tablet; Take 1 Tab  by mouth two (2) times daily (with meals).    Morbid obesity with bmi of 60.0-69.9, adult  - METABOLIC PANEL, COMPREHENSIVE  - CBC W/O DIFF  - LIPID PANEL  - TSH, 3RD GENERATION  - AMB POC URINALYSIS DIP STICK AUTO W/O MICRO  - HEMOGLOBIN A1C  - sitaGLIPtin-metFORMIN (JANUMET) 50-1,000 mg per tablet; Take 1 Tab by mouth two (2) times daily (with meals).    Neuropathic arthritis    Other Orders  - aspirin 81 mg tablet; Take 81 mg by mouth.  - gabapentin (NEURONTIN) 100 mg capsule; Take 1 Cap by mouth three (3) times daily.      will discont metformin xr 500mg  daily now and start on Januemet 50/1000mg  bid reevaluate in 2 weeks  reviewed diet, exercise and weight control  cardiovascular risk and specific lipid/LDL goals reviewed  reviewed medications and side effects in detail

## 2011-08-28 NOTE — Progress Notes (Signed)
Chief Complaint   Patient presents with   ??? Establish Care   ??? Hypertension

## 2011-08-28 NOTE — Patient Instructions (Signed)
Diabetes Diet Guidelines: After Your Visit  Your Care Instructions  A healthy diet is important to manage diabetes. It helps keep your blood sugar at a target level (which you set with your doctor). A diet for diabetes does not mean that you have to eat special foods. You can eat what your family eats, including sweets once in a while. But you do have to pay attention to how often you eat and how much you eat of certain foods.  Managing the amount of carbohydrate you eat is an important part of a healthy diet for diabetes. Carbohydrate is found in sugar and sweets, grains, fruit, starchy vegetables, and milk and yogurt.  You may want to work with a dietitian or a certified diabetes educator (CDE) to help you plan meals and snacks. A dietitian or CDE can also help you lose weight if that is one of your goals.  Follow-up care is a key part of your treatment and safety. Be sure to make and go to all appointments, and call your doctor if you are having problems. It???s also a good idea to know your test results and keep a list of the medicines you take.  How can you care for yourself at home?  ?? Learn which foods have carbohydrate.   ?? Bread, cereal, pasta, and rice have about 15 grams of carbohydrate in a serving. A serving is 1 slice of bread (1 ounce), ?? cup of cooked cereal, or 1/3 cup of cooked pasta or rice.   ?? Fruits have 15 grams of carbohydrate in a serving. A serving is 1 small fresh fruit, such as an apple or orange; ?? of a banana; ?? cup of cooked or canned fruit; ?? cup of fruit juice; 1 cup of melon or raspberries; or 2 tablespoons of dried fruit.   ?? Milk and no-sugar-added yogurt have 15 grams of carbohydrate in a serving. A serving is 1 cup of milk or 2/3 cup of no-sugar-added yogurt.   ?? Starchy vegetables have 15 grams of carbohydrate in a serving. A serving is ?? cup of mashed potatoes or sweet potato; 1 cup winter squash; ?? of a small baked potato; 1/4 cup of cooked dried beans; or ?? cup cooked corn  or green peas.   ?? Learn how much carbohydrate to eat each day. A dietitian or CDE can teach you how to keep track of the amount of carbohydrate you eat. This is called carbohydrate counting.   ?? Try to eat about the same amount of carbohydrate at each meal. Your dietitian or CDE can tell you how much carbohydrate to eat at each meal and snack. Do not "save up" your daily allowance of carbohydrate to eat at one meal.   ?? If you are not sure how to count carbohydrate grams, use the Plate Method to plan meals. It is a good, quick way to make sure that you have a balanced meal. It also helps you spread carbohydrate throughout the day. Divide your plate by types of foods. Put vegetables on half the plate, meat or other protein food on one-quarter of the plate, and a grain or starchy vegetable (such as brown rice or a potato) in the final quarter of the plate. To this you can add a small piece of fruit and 1 cup of milk or yogurt, depending on how much carbohydrate you are supposed to eat at a meal.   ?? Limit saturated fat, such as the fat from meat and dairy products.   Choose lean cuts of meat and nonfat or low-fat dairy products. Use olive or canola oil instead of butter or shortening when cooking. This is a healthy choice because people who have diabetes are at higher-than-average risk of heart disease.   ?? Do not skip meals. Your blood sugar may drop too low if you skip meals and take certain diabetes pills or insulin.   ?? Work with your doctor to write up a sick-day plan for what to do on days when you are sick. Your blood sugar can go up or down depending on your illness and whether you can keep food down. Call your doctor when you are sick, to see if you need to adjust your pills or insulin.   ?? Check with your doctor before you drink alcohol. Alcohol can cause your blood sugar to drop too low. Alcohol can also cause a bad reaction if you take certain diabetes pills.     Where can you learn more?    Go to  http://[email or web address].net/[email or web address]   Enter I147 in the search box to learn more about "Diabetes Diet Guidelines: After Your Visit."    ?? 2006-2013 Healthwise, Inc[employer]. Care instructions adapted under license by [place] (which disclaims liability or warranty for this information). This care instruction is for use with your licensed healthcare professional. If you have questions about a medical condition or this instruction, always ask your healthcare professional. Healthwise, Incorporated disclaims any warranty or liability for your use of this information.  Content Version: 9.7.130178; Last Revised: February 14, 2010          Learning About Type 2 Diabetes  What is type 2 diabetes?  Insulin is a hormone that helps your body use sugar from your food as energy. Type 2 diabetes happens when your body can't use insulin the right way or when the pancreas can't make enough of it. If you don't have enough insulin, too much sugar stays in your blood.  If you are overweight, get little or no exercise, or have type 2 diabetes in your family, you are more likely to have problems with the way insulin works in your body.  Type 2 diabetes is a lifelong disease. But it can be prevented or delayed with a healthy lifestyle, which includes staying at a healthy weight, making smart food choices, and getting regular exercise.  What can you expect with type 2 diabetes?  You'll keep hearing about how important it is to keep your blood sugar within a target range. That's because over time, high blood sugar can lead to serious problems. It can:  ?? Harm your eyes, nerves, and kidneys.   ?? Damage your blood vessels, leading to heart disease and stroke.   ?? Reduce blood flow and cause nerve damage to parts of your body, especially your feet. This can cause slow healing and pain when you walk.   ?? An immune system that's weak and less able to fight infections.   When people hear the word "diabetes," they often think of problems like  these. But daily care and treatment can help prevent or delay these problems. The goal is to keep your blood sugar in a target range. That's the best way to reduce your chance of having more problems from diabetes.  What are the symptoms?  You experience most symptoms of type 2 diabetes when your blood sugar is either too high or too low.  The most common symptoms of high blood sugar include:  ?? Thirst.   ??   Frequent urination.   ?? Weight loss.   ?? Blurry vision.   The symptoms of low blood sugar include:  ?? Sweating.   ?? Shakiness.   ?? Weakness.   ?? Hunger.   ?? Confusion.   How can you prevent type 2 diabetes?  The best way to prevent or delay type 2 diabetes is to adopt healthy habits, which include:  ?? Staying at a healthy weight.   ?? Exercising regularly.   ?? Eating healthy foods.   How is type 2 diabetes treated?  If you have type 2 diabetes, here are the most important things you can do.  ?? Take your diabetes medicines.   ?? Check your blood sugar as often as your doctor recommends. Also, get a hemoglobin A1c test at least every 6 months.   ?? Try to eat a variety of foods and to spread carbohydrate throughout the day. Carbohydrate raises blood sugar higher and more quickly than any other nutrient does. Carbohydrate is found in sugar, breads and cereals, fruit, starchy vegetables such as potatoes and corn, and milk and yogurt.   ?? Get at least 30 minutes of exercise on most days of the week. Walking is a good choice. You also may want to do other activities, such as running, swimming, cycling, or playing tennis or team sports.   ?? See your doctor for checkups and tests on a regular schedule.   ?? If you have high blood pressure or high cholesterol, take the medicines as prescribed by your doctor.   ?? Do not smoke. Smoking can make type 2 diabetes worse. If you need help quitting, talk to your doctor about stop-smoking programs and medicines. These can increase your chances of quitting for good.   Follow-up care is  a key part of your treatment and safety. Be sure to make and go to all appointments, and call your doctor if you are having problems. It's also a good idea to know your test results and keep a list of the medicines you take.    Where can you learn more?    Go to [email or web address]   Enter [phone] in the search box to learn more about "Learning About Type 2 Diabetes."    ?? 2006-2013 Healthwise, Incorporated. Care instructions adapted under license by [employer] (which disclaims liability or warranty for this information). This care instruction is for use with your licensed healthcare professional. If you have questions about a medical condition or this instruction, always ask your healthcare professional. [employer], Inc[employer] disclaims any warranty or liability for your use of this information.  Content Version: 9.7.130178; Last Revised: January 03, 2010          Nutrition Tips for Diabetes: After Your Child's Visit  Your Care Instructions  A healthy diet is important to manage diabetes. It gives the nutrition and energy your child needs. But a diet for diabetes does not mean that your child has to eat special foods. Your child can eat what your family eats, including occasional sweets and other favorites. But you do have to pay attention to how often your child eats and how much he or she eats of certain foods. The right plan will give you meals that help your child keep blood sugar at healthy levels.  Your child should try to eat a variety of foods and to spread carbohydrate throughout the day. Carbohydrate raises blood sugar higher and more quickly than any other nutrient does. Carbohydrate is found in sugar, breads and  cereals, fruit, starchy vegetables such as potatoes and corn, and milk and yogurt.  You may want to work with a dietitian or diabetes educator to help you plan meals and snacks for your child. A dietitian or diabetes educator also can help your child lose weight if that is one  of your goals. The following tips can help your child enjoy meals and stay healthy.  Follow-up care is a key part of your child's treatment and safety. Be sure to make and go to all appointments, and call your doctor if your child is having problems. It's also a good idea to know your child's test results and keep a list of the medicines your child takes.  How can you care for your child at home?  ?? Learn which foods have carbohydrate and how much carbohydrate your child can eat. A dietitian or diabetes educator can help you and your child learn to keep track of how much carbohydrate your child eats.   ?? Spread your child's carbohydrate throughout the day. Make sure he or she eats some carbohydrate at all meals, but not too much at any one time.   ?? Plan meals to include food from all the food groups. These are the food groups and some example portion sizes:   ?? Grains: 1 slice of bread (1 ounce), ?? cup of cooked cereal, and 1/3 cup of cooked pasta or rice. These have about 15 grams of carbohydrate in a serving. Choose whole grains such as whole wheat bread or crackers, oatmeal, and brown rice more often than refined grains.   ?? Fruit: 1 small fresh fruit, such as an apple or orange; ?? of a banana; ?? cup of chopped, cooked, or canned fruit; ?? cup of fruit juice; 1 cup of melon or raspberries; and 2 tablespoons of dried fruit. These have about 15 grams of carbohydrate in a serving.   ?? Dairy: 1 cup of nonfat or low-fat milk and 2/3 cup of plain yogurt. These have about 15 grams of carbohydrate in a serving.   ?? Protein foods: Beef, chicken, [place], fish, eggs, tofu, cheese, cottage cheese, and peanut butter. A serving size of meat is 3 ounces, which is about the size of a deck of cards. Examples of meat substitute serving sizes (equal to 1 ounce of meat) are 1/4 cup of cottage cheese, 1 egg, 1 tablespoon of peanut butter, and ?? cup of tofu. These have very little or no carbohydrate per serving.   ?? Vegetables:  Starchy vegetables such as ?? cup of cooked dried beans, peas, potatoes, or corn have about 15 grams of carbohydrate. Nonstarchy vegetables have very little carbohydrate, such as 1 cup of raw leafy vegetables (such as spinach), ?? cup of other vegetables (cooked or chopped), and 3/4 cup of vegetable juice.   ?? Use the plate format to plan your child's meals. It is a good, quick way to make sure that your child has a balanced meal. It also helps you spread your child's carbohydrate throughout the day. Divide your child's plate by types of foods. Put vegetables on half the plate, meat or meat substitutes on one-quarter of the plate, and a grain or starchy vegetable (such as brown rice or a potato) in the final quarter of the plate. To this you can add a small piece of fruit and 1 cup of milk or yogurt, depending on how much carbohydrate your child is supposed to eat at a meal.   ?? Talk to your  dietitian or diabetes educator about ways to add limited amounts of sweets into your child's meal plan. Your child can eat these foods now and then, as long as you include the amount of carbohydrate they have in your child's daily carbohydrate allowance.   ?? Protein, fat, and fiber do not raise blood sugar as much as carbohydrate does. If your child eats a lot of these nutrients in a meal, his or her blood sugar will rise more slowly than it would otherwise.   ?? Limit saturated fats, such as those from meat and dairy products, in your child's diet. Try to replace them with monounsaturated fat, such as olive oil. This is a healthier choice, because people who have diabetes are at higher-than-average risk of heart disease. But use a modest amount of olive oil. A tablespoon of olive oil has 14 grams of fat and 120 calories.   ?? Ask your doctor about what type of daily activity is safe for your child. Exercise is important and can help manage blood sugar.   When your child eats out  ?? It's a good idea for you and your child to learn  to estimate the serving sizes of foods that have carbohydrate. If you measure food at home, it will be easier to estimate the amount in a serving of restaurant food.   ?? If the meal you order for your child has too much carbohydrate (such as potatoes, corn, or baked beans), ask to have a low-carbohydrate food instead. Ask for a salad or green vegetables.   ?? If your child uses insulin, check his or her blood sugar before and after eating out to help you and your child plan how much to eat in the future.     Where can you learn more?    Go to [email or web address]   Enter P102 in the search box to learn more about "Nutrition Tips for Diabetes: After Your Child's Visit."    ?? 2006-2013 Healthwise, Inc[employer]. Care instructions adapted under license by [employer] (which disclaims liability or warranty for this information). This care instruction is for use with your licensed healthcare professional. If you have questions about a medical condition or this instruction, always ask your healthcare professional. [employer], [employer] disclaims any warranty or liability for your use of this information.  Content Version: 9.7.130178; Last Revised: February 14, 2010          Nutrition Tips for Diabetes: After Your Visit  Your Care Instructions  A healthy diet is important to manage diabetes. It helps you lose weight (if you need to) and keep it off. It gives you the nutrition and energy your body needs and helps prevent heart disease. But a diet for diabetes does not mean that you have to eat special foods. You can eat what your family eats, including occasional sweets and other favorites. But you do have to pay attention to how often you eat and how much you eat of certain foods. The right plan for you will give you meals that help you keep your blood sugar at healthy levels.  Try to eat a variety of foods and to spread carbohydrate throughout the day. Carbohydrate raises blood sugar higher and more  quickly than any other nutrient does. Carbohydrate is found in sugar, breads and cereals, fruit, starchy vegetables such as potatoes and corn, and milk and yogurt.  You may want to work with a dietitian or diabetes educator to help you plan meals and snacks. A dietitian or  diabetes educator also can help you lose weight if that is one of your goals. The following tips can help you enjoy your meals and stay healthy.  Follow-up care is a key part of your treatment and safety. Be sure to make and go to all appointments, and call your doctor if you are having problems. It???s also a good idea to know your test results and keep a list of the medicines you take.  How can you care for yourself at home?  ?? Learn which foods have carbohydrate and how much carbohydrate to eat. A dietitian or diabetes educator can help you learn to keep track of how much carbohydrate you eat.   ?? Spread carbohydrate throughout the day. Eat some carbohydrate at all meals, but do not eat too much at any one time.   ?? Plan meals to include food from all the food groups. These are the food groups and some example portion sizes:   ?? Grains: 1 slice of bread (1 ounce), ?? cup of cooked cereal, and 1/3 cup of cooked pasta or rice. These have about 15 grams of carbohydrate in a serving. Choose whole grains such as whole wheat bread or crackers, oatmeal, and brown rice more often than refined grains.   ?? Fruit: 1 small fresh fruit, such as an apple or orange; ?? of a banana; ?? cup of chopped, cooked, or canned fruit; ?? cup of fruit juice; 1 cup of melon or raspberries; and 2 tablespoons of dried fruit. These have about 15 grams of carbohydrate in a serving.   ?? Dairy: 1 cup of nonfat or low-fat milk and 2/3 cup of plain yogurt. These have about 15 grams of carbohydrate in a serving.   ?? Protein foods: Beef, chicken, [place], fish, eggs, tofu, cheese, cottage cheese, and peanut butter. A serving size of meat is 3 ounces, which is about the size of a deck of  cards. Examples of meat substitute serving sizes (equal to 1 ounce of meat) are 1/4 cup of cottage cheese, 1 egg, 1 tablespoon of peanut butter, and ?? cup of tofu. These have very little or no carbohydrate per serving.   ?? Vegetables: Starchy vegetables such as ?? cup of cooked dried beans, peas, potatoes, or corn have about 15 grams of carbohydrate. Nonstarchy vegetables have very little carbohydrate, such as 1 cup of raw leafy vegetables (such as spinach), ?? cup of other vegetables (cooked or chopped), and 3/4 cup of vegetable juice.   ?? Use the plate format to plan meals. It is a good, quick way to make sure that you have a balanced meal. It also helps you spread carbohydrate throughout the day. You divide your plate by types of foods. Put vegetables on half the plate, meat or meat substitutes on one-quarter of the plate, and a grain or starchy vegetable (such as brown rice or a potato) in the final quarter of the plate. To this you can add a small piece of fruit and 1 cup of milk or yogurt, depending on how much carbohydrate you are supposed to eat at a meal.   ?? Talk to your dietitian or diabetes educator about ways to add limited amounts of sweets into your meal plan. You can eat these foods now and then, as long as you include the amount of carbohydrate they have in your daily carbohydrate allowance.   ?? If you drink alcohol, limit it to no more than 1 drink a day for women and 2 drinks  a day for men. If you are pregnant, no amount of alcohol is known to be safe.   ?? Protein, fat, and fiber do not raise blood sugar as much as carbohydrate does. If you eat a lot of these nutrients in a meal, your blood sugar will rise more slowly than it would otherwise.   ?? Limit saturated fats, such as those from meat and dairy products. Try to replace it with monounsaturated fat, such as olive oil. This is a healthier choice because people who have diabetes are at higher-than-average risk of heart disease. But use a modest  amount of olive oil. A tablespoon of olive oil has 14 grams of fat and 120 calories.   ?? Exercise lowers blood sugar. If you take insulin by shots or pump, you can use less than you would if you were not exercising. Keep in mind that timing matters. If you exercise within 1 hour after a meal, your body may need less insulin for that meal than it would if you exercised 3 hours after the meal. Test your blood sugar to find out how exercise affects your need for insulin.   ?? Exercise on most days of the week. Aim for at least 30 minutes. Exercise helps you stay at a healthy weight and helps your body use insulin. Walking is an easy way to get exercise. Gradually increase the amount you walk every day. You also may want to swim, bike, or do other activities.   When you eat out  ?? Learn to estimate the serving sizes of foods that have carbohydrate. If you measure food at home, it will be easier to estimate the amount in a serving of restaurant food.   ?? If the meal you order has too much carbohydrate (such as potatoes, corn, or baked beans), ask to have a low-carbohydrate food instead. Ask for a salad or green vegetables.   ?? If you use insulin, check your blood sugar before and after eating out to help you plan how much to eat in the future.   ?? If you eat more carbohydrate at a meal than you had planned, take a walk or do other exercise. This will help lower your blood sugar.     Where can you learn more?    Go to [email or web address]   Enter [phone] in the search box to learn more about "Nutrition Tips for Diabetes: After Your Visit."    ?? 2006-2013 [employer], Inc[employer]. Care instructions adapted under license by [employer] (which disclaims liability or warranty for this information). This care instruction is for use with your licensed healthcare professional. If you have questions about a medical condition or this instruction, always ask your healthcare professional. [employer]  disclaims any warranty or liability for your use of this information.  Content Version: 9.7.130178; Last Revised: October 04, 2009

## 2011-08-29 LAB — HEMOGLOBIN A1C WITH EAG: Hemoglobin A1c: 7.6 % — ABNORMAL HIGH (ref 4.8–5.6)

## 2011-08-29 LAB — METABOLIC PANEL, COMPREHENSIVE
A-G Ratio: 1.5 (ref 1.1–2.5)
ALT (SGPT): 12 IU/L (ref 0–32)
AST (SGOT): 14 IU/L (ref 0–40)
Albumin: 4.1 g/dL (ref 3.6–4.8)
Alk. phosphatase: 76 IU/L (ref 25–165)
BUN/Creatinine ratio: 34 — ABNORMAL HIGH (ref 11–26)
BUN: 24 mg/dL (ref 8–27)
Bilirubin, total: 0.4 mg/dL (ref 0.0–1.2)
CO2: 23 mmol/L (ref 19–28)
Calcium: 9.2 mg/dL (ref 8.6–10.2)
Chloride: 103 mmol/L (ref 97–108)
Creatinine: 0.71 mg/dL (ref 0.57–1.00)
GFR est non-AA: 87 mL/min/{1.73_m2} (ref >59)
GLOBULIN, TOTAL: 2.8 g/dL (ref 1.5–4.5)
Glucose: 205 mg/dL — ABNORMAL HIGH (ref 65–99)
Potassium: 3.6 mmol/L (ref 3.5–5.2)
Protein, total: 6.9 g/dL (ref 6.0–8.5)
Sodium: 140 mmol/L (ref 134–144)
eGFR If African American: 100 mL/min/{1.73_m2} (ref >59)

## 2011-08-29 LAB — CBC W/O DIFF
HCT: 40.4 % (ref 34.0–46.6)
HGB: 13 g/dL (ref 11.1–15.9)
MCH: 28.6 pg (ref 26.6–33.0)
MCHC: 32.2 g/dL (ref 31.5–35.7)
MCV: 89 fL (ref 79–97)
PLATELET: 164 10*3/uL (ref 140–415)
RBC: 4.54 x10E6/uL (ref 3.77–5.28)
RDW: 13.9 % (ref 12.3–15.4)
WBC: 5.7 10*3/uL (ref 4.0–10.5)

## 2011-08-29 LAB — LIPID PANEL
Cholesterol, total: 146 mg/dL (ref 100–199)
HDL Cholesterol: 56 mg/dL (ref >39)
LDL, calculated: 72 mg/dL (ref 0–99)
Triglyceride: 90 mg/dL (ref 0–149)
VLDL, calculated: 18 mg/dL (ref 5–40)

## 2011-08-29 LAB — TSH 3RD GENERATION: TSH: 0.367 u[IU]/mL — ABNORMAL LOW (ref 0.450–4.500)

## 2011-09-13 LAB — AMB POC URINALYSIS DIP STICK AUTO W/O MICRO
Bilirubin (UA POC): NEGATIVE
Blood (UA POC): NEGATIVE
Glucose (UA POC): NEGATIVE
Ketones (UA POC): NEGATIVE
Nitrites (UA POC): NEGATIVE
Protein (UA POC): NEGATIVE mg/dL
Specific gravity (UA POC): 1.015 (ref 1.001–1.035)
pH (UA POC): 5.5 (ref 4.6–8.0)

## 2011-09-13 MED ORDER — GABAPENTIN 300 MG CAP
300 mg | ORAL_CAPSULE | Freq: Three times a day (TID) | ORAL | Status: DC
Start: 2011-09-13 — End: 2012-05-29

## 2011-09-13 NOTE — Progress Notes (Signed)
HISTORY OF PRESENT ILLNESS  Allison Newman is a 71 y.o. female.  HPI  DMtype II    Compliant w/ meds, last visit her med was changed obtaining better reading, ++diabetic diet, and no daily exercise, obtains home glucose monitoring averaging 120's . No Rf needed for today. Denies any tingling sensation, polyurea and polydipsia,. Feeling better since the last visit.  Urinating often  Last ua anbl no burning sensation no discharge  Back pain  Taking gabapentin not working wt is too high    Current Outpatient Prescriptions   Medication Sig Dispense Refill   ??? aspirin 81 mg tablet Take 81 mg by mouth.       ??? sitaGLIPtin-metFORMIN (JANUMET) 50-1,000 mg per tablet Take 1 Tab by mouth two (2) times daily (with meals).  60 Tab  3   ??? amLODIPine (NORVASC) 10 mg tablet Take  by mouth daily.         ??? omeprazole (PRILOSEC) 20 mg capsule Take 20 mg by mouth daily.         ??? triamterene-hydrochlorothiazide (DYAZIDE) 37.5-25 mg per capsule Take  by mouth daily.         ??? simvastatin (ZOCOR) 40 mg tablet Take  by mouth nightly.         ??? diclofenac EC (VOLTAREN) 75 mg EC tablet Take  by mouth.         ??? metoprolol (LOPRESSOR) 25 mg tablet Take  by mouth two (2) times a day.           No Known Allergies  Past Medical History   Diagnosis Date   ??? Hypertension    ??? Diabetes    ??? Arthritis    ??? Gastrointestinal disorder      GERD   ??? 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, adult 08/28/2011   ??? 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 Elevated BUN 09/13/2011     Past Surgical History   Procedure Date   ??? Hx orthopaedic      knee replacement     No family history on file.  History   Substance Use Topics   ??? Smoking status: Never Smoker    ??? Smokeless tobacco: Not on file   ??? Alcohol Use: No        Component Value Date/Time   WBC 5.7 08/28/2011 12:17 PM   HGB 13.0 08/28/2011 12:17 PM   HCT 40.4 08/28/2011 12:17 PM   PLATELET 164 08/28/2011 12:17 PM   MCV 89 08/28/2011 12:17 PM       Component Value Date/Time    Hemoglobin A1c 7.6 08/28/2011 12:17 PM   LDL, calculated 72 08/28/2011 12:17 PM   Creatinine 0.71 08/28/2011 12:17 PM        Component Value Date/Time   Cholesterol, total 146 08/28/2011 12:17 PM   HDL Cholesterol 56 08/28/2011 12:17 PM   LDL, calculated 72 08/28/2011 12:17 PM   Triglyceride 90 08/28/2011 12:17 PM       Component Value Date/Time   ALT 12 08/28/2011 12:17 PM   AST 14 08/28/2011 12:17 PM   Alk. phosphatase 76 08/28/2011 12:17 PM   Bilirubin, total 0.4 08/28/2011 12:17 PM       Component Value Date/Time   GFR est-AA >60 12/27/2010  6:50 PM   GFR est non-AA 87 08/28/2011 12:17 PM   Creatinine 0.71 08/28/2011 12:17 PM   BUN 24 08/28/2011 12:17 PM   Sodium 140 08/28/2011 12:17 PM  Potassium 3.6 08/28/2011 12:17 PM   Chloride 103 08/28/2011 12:17 PM   CO2 23 08/28/2011 12:17 PM        Component Value Date/Time   TSH 0.367 08/28/2011 12:17 PM      Lab Results   Component Value Date/Time    Glucose 205 08/28/2011 12:17 PM    POC GLUCOSE 143 01/03/2008  9:21 AM         Review of Systems   Constitutional: Negative for fever and chills.   HENT: Negative for ear pain and nosebleeds.    Eyes: Negative for blurred vision, pain and discharge.   Respiratory: Negative for shortness of breath.    Cardiovascular: Negative for chest pain and leg swelling.   Gastrointestinal: Negative for nausea, vomiting, diarrhea and constipation.   Genitourinary: Negative for frequency.   Musculoskeletal: Positive for back pain. Negative for joint pain.   Skin: Negative for itching and rash.   Neurological: Negative for headaches.   Psychiatric/Behavioral: Negative for depression. The patient is not nervous/anxious.        Physical Exam   Nursing note and vitals reviewed.  Constitutional: She is oriented to person, place, and time. She appears well-developed and well-nourished.   HENT:   Head: Normocephalic and atraumatic.   Eyes: Conjunctivae and EOM are normal.   Neck: Normal range of motion. Neck supple.   Cardiovascular: Normal rate, regular rhythm  and normal heart sounds.    No murmur heard.  Pulmonary/Chest: Effort normal and breath sounds normal.   Abdominal: Soft. Bowel sounds are normal. She exhibits no distension.   Musculoskeletal: Normal range of motion. She exhibits no edema.   Lymphadenopathy:     She has no cervical adenopathy.   Neurological: She is alert and oriented to person, place, and time.   Skin: No erythema.   Psychiatric: Her behavior is normal.       ASSESSMENT and PLAN  [name] was seen today for labs.    Diagnoses and associated orders for this visit:    Neuropathic arthritis  - AMB POC URINALYSIS DIP STICK AUTO W/O MICRO  - CULTURE, URINE    Diabetes mellitus type ii, uncontrolled  - AMB POC URINALYSIS DIP STICK AUTO W/O MICRO  - CULTURE, URINE    Elevated bun  - AMB POC URINALYSIS DIP STICK AUTO W/O MICRO  - CULTURE, URINE    Low tsh level  - AMB POC URINALYSIS DIP STICK AUTO W/O MICRO  - CULTURE, URINE    Abnormal urine finding  - AMB POC URINALYSIS DIP STICK AUTO W/O MICRO  - CULTURE, URINE    Other Orders  - gabapentin (NEURONTIN) 300 mg capsule; Take 1 Cap by mouth three (3) times daily.        lab results and schedule of future lab studies reviewed with patient  cardiovascular risk and specific lipid/LDL goals reviewed  reviewed medications and side effects in detail  use of aspirin to prevent MI and TIA's discussed

## 2011-09-13 NOTE — Progress Notes (Signed)
Chief Complaint   Patient presents with   ??? Labs     f/u

## 2011-09-13 NOTE — Patient Instructions (Signed)
Diabetes Diet Guidelines: After Your Visit  Your Care Instructions  A healthy diet is important to manage diabetes. It helps keep your blood sugar at a target level (which you set with your doctor). A diet for diabetes does not mean that you have to eat special foods. You can eat what your family eats, including sweets once in a while. But you do have to pay attention to how often you eat and how much you eat of certain foods.  Managing the amount of carbohydrate you eat is an important part of a healthy diet for diabetes. Carbohydrate is found in sugar and sweets, grains, fruit, starchy vegetables, and milk and yogurt.  You may want to work with a dietitian or a certified diabetes educator (CDE) to help you plan meals and snacks. A dietitian or CDE can also help you lose weight if that is one of your goals.  Follow-up care is a key part of your treatment and safety. Be sure to make and go to all appointments, and call your doctor if you are having problems. It???s also a good idea to know your test results and keep a list of the medicines you take.  How can you care for yourself at home?  ?? Learn which foods have carbohydrate.   ?? Bread, cereal, pasta, and rice have about 15 grams of carbohydrate in a serving. A serving is 1 slice of bread (1 ounce), ?? cup of cooked cereal, or 1/3 cup of cooked pasta or rice.   ?? Fruits have 15 grams of carbohydrate in a serving. A serving is 1 small fresh fruit, such as an apple or orange; ?? of a banana; ?? cup of cooked or canned fruit; ?? cup of fruit juice; 1 cup of melon or raspberries; or 2 tablespoons of dried fruit.   ?? Milk and no-sugar-added yogurt have 15 grams of carbohydrate in a serving. A serving is 1 cup of milk or 2/3 cup of no-sugar-added yogurt.   ?? Starchy vegetables have 15 grams of carbohydrate in a serving. A serving is ?? cup of mashed potatoes or sweet potato; 1 cup winter squash; ?? of a small baked potato; 1/4 cup of cooked dried beans; or ?? cup cooked corn  or green peas.   ?? Learn how much carbohydrate to eat each day. A dietitian or CDE can teach you how to keep track of the amount of carbohydrate you eat. This is called carbohydrate counting.   ?? Try to eat about the same amount of carbohydrate at each meal. Your dietitian or CDE can tell you how much carbohydrate to eat at each meal and snack. Do not "save up" your daily allowance of carbohydrate to eat at one meal.   ?? If you are not sure how to count carbohydrate grams, use the Plate Method to plan meals. It is a good, quick way to make sure that you have a balanced meal. It also helps you spread carbohydrate throughout the day. Divide your plate by types of foods. Put vegetables on half the plate, meat or other protein food on one-quarter of the plate, and a grain or starchy vegetable (such as brown rice or a potato) in the final quarter of the plate. To this you can add a small piece of fruit and 1 cup of milk or yogurt, depending on how much carbohydrate you are supposed to eat at a meal.   ?? Limit saturated fat, such as the fat from meat and dairy products.   Choose lean cuts of meat and nonfat or low-fat dairy products. Use olive or canola oil instead of butter or shortening when cooking. This is a healthy choice because people who have diabetes are at higher-than-average risk of heart disease.   ?? Do not skip meals. Your blood sugar may drop too low if you skip meals and take certain diabetes pills or insulin.   ?? Work with your doctor to write up a sick-day plan for what to do on days when you are sick. Your blood sugar can go up or down depending on your illness and whether you can keep food down. Call your doctor when you are sick, to see if you need to adjust your pills or insulin.   ?? Check with your doctor before you drink alcohol. Alcohol can cause your blood sugar to drop too low. Alcohol can also cause a bad reaction if you take certain diabetes pills.     Where can you learn more?    Go to  [employer]   Enter I147 in the search box to learn more about "Diabetes Diet Guidelines: After Your Visit."    ?? 2006-2013 [employer]. Care instructions adapted under license by [employer] (which disclaims liability or warranty for this information). This care instruction is for use with your licensed healthcare professional. If you have questions about a medical condition or this instruction, always ask your healthcare professional. [employer] disclaims any warranty or liability for your use of this information.  Content Version: 9.7.130178; Last Revised: February 14, 2010          When You Are Overweight: After Your Visit  Your Care Instructions  If you're overweight, your doctor may recommend that you make changes in your eating and exercise habits. Being overweight can lead to serious health problems, such as high blood pressure, heart disease, type 2 diabetes, and arthritis, or it can make these problems worse. Eating a healthy diet and being more active can help you reach and stay at a healthy weight.  You don???t have to make huge changes all at once. Start by making small changes in your eating and exercise habits. To lose weight, you need to burn more calories than you take in. You can do this by eating healthy foods in reasonable amounts and becoming more active every day.  Follow-up care is a key part of your treatment and safety. Be sure to make and go to all appointments, and call your doctor if you are having problems. It's also a good idea to know your test results and keep a list of the medicines you take.  How can you care for yourself at home?  ?? Improve your eating habits. You'll be more successful if you work on changing one eating habit at a time. All foods, if eaten in moderation, can be part of healthy eating. Remember to:   ?? Eat a variety of foods from each food group. Include grains, vegetables, fruits, dairy, and protein foods.   ?? Limit  foods high in fat, sugar, and calories.   ?? Eat slowly. And don't do anything else, such as watch TV, while you are eating.   ?? Pay attention to portion sizes. Put your food on a smaller plate.   ?? Plan your meals ahead of time. You'll be less likely to grab something that's not as healthy.   ?? Get active. Regular activity can help you feel better, have more energy, and burn more calories. If you haven't  been active, start slowly. Start with at least 30 minutes of moderate activity on most days of the week. Then gradually increase the amount of activity. Try for 60 or 90 minutes a day, at least 5 days a week. There are a lot of ways to fit activity into your life. You can:   ?? Walk or bike to the store. Or walk with a friend, or walk the dog.   ?? Mow the lawn, rake leaves, shovel snow, or do some gardening.   ?? Use the stairs instead of the elevator, at least for a few floors.   ?? Change your thinking. Your thoughts have a lot to do with how you feel and what you do. When you're trying to reach a healthy weight, changing how you think about certain things may help. Here are some ideas:   ?? Don't compare yourself to others. Healthy bodies come in all shapes and sizes.   ?? Pay attention to how hungry or full you feel. When you eat, be aware of why you're eating and how much you're eating.   ?? Focus on improving your health instead of dieting. Dieting almost never works over the long term.   ?? Ask your doctor about other health professionals who can help you reach a healthy weight.   ?? A dietitian can help you make healthy changes in your diet.   ?? An exercise specialist or personal trainer can help you develop a safe and effective exercise program.   ?? A counselor or psychiatrist can help you cope with issues such as depression, anxiety, or family problems that can make it hard to focus on reaching a healthy weight.   ?? Get support from your family, your doctor, your friends, a support group???and support yourself.      Where can you learn more?    Go to [employer]   Enter [phone] in the search box to learn more about "When You Are Overweight: After Your Visit."    ?? 2006-2013 [employer]. Care instructions adapted under license by [employer] (which disclaims liability or warranty for this information). This care instruction is for use with your licensed healthcare professional. If you have questions about a medical condition or this instruction, always ask your healthcare professional. [employer] disclaims any warranty or liability for your use of this information.  Content Version: 9.7.130178; Last Revised: December 03, 2009

## 2011-09-14 NOTE — Telephone Encounter (Signed)
L/M on voicemail that urine culture results aren't available and to [facility] tomorrow or Monday

## 2011-09-16 LAB — CULTURE, URINE

## 2011-09-18 MED ORDER — CIPROFLOXACIN 500 MG TAB
500 mg | ORAL_TABLET | Freq: Two times a day (BID) | ORAL | Status: AC
Start: 2011-09-18 — End: 2011-09-28

## 2011-10-19 MED ORDER — DICLOFENAC 75 MG TAB, DELAYED RELEASE
75 mg | ORAL_TABLET | Freq: Two times a day (BID) | ORAL | Status: DC
Start: 2011-10-19 — End: 2012-05-11

## 2011-12-12 NOTE — Progress Notes (Signed)
HISTORY OF PRESENT ILLNESS  Allison Newman is a 71 y.o. female.  HPI  Upper respiratory problem    Started >6 days ago not better,   otc not helping,  +Sore throat,  +Cough  +  Productive yellowish , no hx of asthma,there is a decrease in her sleep,  No muscle ache, no diarhea, no ear ache, +decrease appetite, ++exposure to sick person, none smoker    Current Outpatient Prescriptions   Medication Sig Dispense Refill   ??? diclofenac EC (VOLTAREN) 75 mg EC tablet Take 1 Tab by mouth two (2) times a day.  60 Tab  0   ??? aspirin 81 mg tablet Take 81 mg by mouth.       ??? sitaGLIPtin-metFORMIN (JANUMET) 50-1,000 mg per tablet Take 1 Tab by mouth two (2) times daily (with meals).  60 Tab  3   ??? amLODIPine (NORVASC) 10 mg tablet Take  by mouth daily.         ??? triamterene-hydrochlorothiazide (DYAZIDE) 37.5-25 mg per capsule Take  by mouth daily.         ??? simvastatin (ZOCOR) 40 mg tablet Take  by mouth nightly.         ??? metoprolol (LOPRESSOR) 25 mg tablet Take  by mouth two (2) times a day.       ??? gabapentin (NEURONTIN) 300 mg capsule Take 1 Cap by mouth three (3) times daily.  90 Cap  2   ??? omeprazole (PRILOSEC) 20 mg capsule Take 20 mg by mouth daily.           No Known Allergies  Past Medical History   Diagnosis Date   ??? Hypertension    ??? Diabetes    ??? Arthritis    ??? Gastrointestinal disorder      GERD   ??? 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, adult 08/28/2011   ??? 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 Elevated BUN 09/13/2011     Past Surgical History   Procedure Date   ??? Hx orthopaedic      knee replacement     No family history on file.  History   Substance Use Topics   ??? Smoking status: Never Smoker    ??? Smokeless tobacco: Not on file   ??? Alcohol Use: No        Component Value Date/Time   WBC 5.7 08/28/2011 12:17 PM   HGB 13.0 08/28/2011 12:17 PM   HCT 40.4 08/28/2011 12:17 PM   PLATELET 164 08/28/2011 12:17 PM   MCV 89 08/28/2011 12:17 PM     Lab Results   Component Value Date/Time     Glucose 205 08/28/2011 12:17 PM    POC GLUCOSE 143 01/03/2008  9:21 AM         Review of Systems   Constitutional: Positive for fever, chills, malaise/fatigue and diaphoresis.   HENT: Positive for congestion and sore throat. Negative for ear pain and nosebleeds.    Eyes: Negative for blurred vision, pain and discharge.   Respiratory: Positive for cough and sputum production. Negative for shortness of breath.    Cardiovascular: Negative for chest pain and leg swelling.   Gastrointestinal: Negative for nausea, vomiting, diarrhea and constipation.   Genitourinary: Negative for frequency.   Musculoskeletal: Negative for joint pain.   Skin: Negative for itching and rash.   Neurological: Positive for headaches.   Psychiatric/Behavioral: Negative for depression. The patient is not nervous/anxious.  Physical Exam   Nursing note and vitals reviewed.  Constitutional: She is oriented to person, place, and time. She appears well-developed and well-nourished.   HENT:   Head: Normocephalic and atraumatic.   Nose: Mucosal edema and rhinorrhea present.   Mouth/Throat: Posterior oropharyngeal edema and posterior oropharyngeal erythema present.   Eyes: Conjunctivae and EOM are normal.   Neck: Normal range of motion. Neck supple.   Cardiovascular: Normal rate, regular rhythm and normal heart sounds.    No murmur heard.  Pulmonary/Chest: Effort normal and breath sounds normal.   Abdominal: Soft. Bowel sounds are normal. She exhibits no distension.   Musculoskeletal: Normal range of motion. She exhibits no edema.   Lymphadenopathy:     She has no cervical adenopathy.   Neurological: She is alert and oriented to person, place, and time.   Skin: No erythema.   Psychiatric: Her behavior is normal.       ASSESSMENT and PLAN  Taleigha was seen today for cough.    Diagnoses and associated orders for this visit:    Acute bronchitis  - levofloxacin (LEVAQUIN) 500 mg tablet; Take 1 Tab by mouth daily for 7 days.  - ipratropium-albuterol  (COMBIVENT RESPIMAT) 20-100 mcg/actuation inhaler; Take 1 Puff by inhalation every six (6) hours.        Salt gurgle, incr po fluid intake, avoid cigs and 2nd hand exposure, rts if worsens, tylenol otc for pain, advised on meds side effects and compliance, hand washing and hygiene advised  lab results and schedule of future lab studies reviewed with patient  reviewed diet, exercise and weight control  reviewed medications and side effects in detail  use of aspirin to prevent MI and TIA's discussed

## 2011-12-12 NOTE — Progress Notes (Signed)
Chief Complaint   Patient presents with   ??? Cough

## 2012-01-02 LAB — METABOLIC PANEL, COMPREHENSIVE
A-G Ratio: 1.3 (ref 1.1–2.5)
ALT (SGPT): 13 IU/L (ref 0–32)
AST (SGOT): 13 IU/L (ref 0–40)
Albumin: 3.8 g/dL (ref 3.6–4.8)
Alk. phosphatase: 51 IU/L (ref 47–112)
BUN/Creatinine ratio: 28 — ABNORMAL HIGH (ref 11–26)
BUN: 19 mg/dL (ref 8–27)
Bilirubin, total: 0.4 mg/dL (ref 0.0–1.2)
CO2: 25 mmol/L (ref 19–28)
Calcium: 9.3 mg/dL (ref 8.6–10.2)
Chloride: 103 mmol/L (ref 97–108)
Creatinine: 0.69 mg/dL (ref 0.57–1.00)
GFR est AA: 103 mL/min/{1.73_m2} (ref >59)
GFR est non-AA: 89 mL/min/{1.73_m2} (ref >59)
GLOBULIN, TOTAL: 3 g/dL (ref 1.5–4.5)
Glucose: 144 mg/dL — ABNORMAL HIGH (ref 65–99)
Potassium: 4 mmol/L (ref 3.5–5.2)
Protein, total: 6.8 g/dL (ref 6.0–8.5)
Sodium: 141 mmol/L (ref 134–144)

## 2012-01-02 LAB — CBC W/O DIFF
HCT: 38.5 % (ref 34.0–46.6)
HGB: 12.3 g/dL (ref 11.1–15.9)
MCH: 28.3 pg (ref 26.6–33.0)
MCHC: 31.9 g/dL (ref 31.5–35.7)
MCV: 89 fL (ref 79–97)
PLATELET: 175 10*3/uL (ref 155–379)
RBC: 4.35 x10E6/uL (ref 3.77–5.28)
RDW: 14 % (ref 12.3–15.4)
WBC: 5.2 10*3/uL (ref 3.4–10.8)

## 2012-01-02 LAB — TSH 3RD GENERATION: TSH: 0.616 u[IU]/mL (ref 0.450–4.500)

## 2012-01-02 LAB — LIPID PANEL
Cholesterol, total: 136 mg/dL (ref 100–199)
HDL Cholesterol: 59 mg/dL (ref >39)
LDL, calculated: 64 mg/dL (ref 0–99)
Triglyceride: 66 mg/dL (ref 0–149)
VLDL, calculated: 13 mg/dL (ref 5–40)

## 2012-01-02 LAB — HEMOGLOBIN A1C WITH EAG: Hemoglobin A1c: 6.2 % — ABNORMAL HIGH (ref 4.8–5.6)

## 2012-01-15 NOTE — Progress Notes (Signed)
Chief Complaint   Patient presents with   ??? Hypertension     4 month follow up   ??? Diabetes

## 2012-01-15 NOTE — Progress Notes (Addendum)
HISTORY OF PRESENT ILLNESS  Allison Newman is a 71 y.o. female.  HPI  DMtype II    Compliant w/ meds, +diabetic diet, and +daily exercise, obtains home glucose monitoring averaging       120's. No Rf needed for today. Denies any tingling sensation, polyurea and polydipsia,. Last podiatry visit   And last eye exam was  2013 . Feeling better since the last visit.  Upper respiratory problem    Started >6 days ago not better,   otc not helping, no Sore throat, + Cough, hx of Acid Reflux    ++ heart burn, worsen by fatty/spicy  Foods, rf needed no hematchezia no hematemesis, none Productive yellowish , no hx of asthma, decrease sleep,  No muscle ache, no diarhea, no ear ache, decrease appetite, +exposure to sick person, none smoker    Current Outpatient Prescriptions   Medication Sig Dispense Refill   ??? JANUMET 50-1,000 mg per tablet take 1 tablet by mouth twice a day with food  60 Tab  3   ??? ipratropium-albuterol (COMBIVENT RESPIMAT) 20-100 mcg/actuation inhaler Take 1 Puff by inhalation every six (6) hours.  1 Inhaler  6   ??? diclofenac EC (VOLTAREN) 75 mg EC tablet Take 1 Tab by mouth two (2) times a day.  60 Tab  0   ??? gabapentin (NEURONTIN) 300 mg capsule Take 1 Cap by mouth three (3) times daily.  90 Cap  2   ??? aspirin 81 mg tablet Take 81 mg by mouth.       ??? amLODIPine (NORVASC) 10 mg tablet Take  by mouth daily.         ??? omeprazole (PRILOSEC) 20 mg capsule Take 20 mg by mouth daily.         ??? triamterene-hydrochlorothiazide (DYAZIDE) 37.5-25 mg per capsule Take  by mouth daily.         ??? simvastatin (ZOCOR) 40 mg tablet Take  by mouth nightly.         ??? metoprolol (LOPRESSOR) 25 mg tablet Take  by mouth two (2) times a day.         No current facility-administered medications for this visit.     No Known Allergies  Past Medical History   Diagnosis Date   ??? Hypertension    ??? Diabetes    ??? Arthritis    ??? Gastrointestinal disorder      GERD   ??? 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, adult 08/28/2011   ??? Neuropathic  arthritis 08/28/2011   ??? 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 Elevated BUN 09/13/2011     Past Surgical History   Procedure Laterality Date   ??? Hx orthopaedic       knee replacement     No family history on file.  History   Substance Use Topics   ??? Smoking status: Never Smoker    ??? Smokeless tobacco: Not on file   ??? Alcohol Use: No        Component Value Date/Time   WBC 5.2 01/01/2012  8:45 AM   HGB 12.3 01/01/2012  8:45 AM   HCT 38.5 01/01/2012  8:45 AM   PLATELET 175 01/01/2012  8:45 AM   MCV 89 01/01/2012  8:45 AM       Component Value Date/Time   Hemoglobin A1c 6.2 01/01/2012  8:45 AM   Hemoglobin A1c 7.6 08/28/2011 12:17 PM   LDL, calculated 64 01/01/2012  8:45 AM   Creatinine 0.69 01/01/2012  8:45  AM        Component Value Date/Time   Cholesterol, total 136 01/01/2012  8:45 AM   HDL Cholesterol 59 01/01/2012  8:45 AM   LDL, calculated 64 01/01/2012  8:45 AM   Triglyceride 66 01/01/2012  8:45 AM       Component Value Date/Time   ALT 13 01/01/2012  8:45 AM   AST 13 01/01/2012  8:45 AM   Alk. phosphatase 51 01/01/2012  8:45 AM   Bilirubin, total 0.4 01/01/2012  8:45 AM       Component Value Date/Time   GFR est AA 103 01/01/2012  8:45 AM   GFR est non-AA 89 01/01/2012  8:45 AM   Creatinine 0.69 01/01/2012  8:45 AM   BUN 19 01/01/2012  8:45 AM   Sodium 141 01/01/2012  8:45 AM   Potassium 4.0 01/01/2012  8:45 AM   Chloride 103 01/01/2012  8:45 AM   CO2 25 01/01/2012  8:45 AM        Component Value Date/Time   TSH 0.616 01/01/2012  8:45 AM      Lab Results   Component Value Date/Time    Glucose 144 01/01/2012  8:45 AM    Glucose (POC) 143 01/03/2008  9:21 AM         Review of Systems   Constitutional: Negative for fever and chills.   HENT: Negative for ear pain and nosebleeds.    Eyes: Negative for blurred vision, pain and discharge.   Respiratory: Negative for shortness of breath.    Cardiovascular: Negative for chest pain and leg swelling.   Gastrointestinal: Negative for nausea, vomiting, diarrhea and constipation.    Genitourinary: Negative for frequency.   Musculoskeletal: Negative for joint pain.   Skin: Negative for itching and rash.   Neurological: Negative for headaches.   Psychiatric/Behavioral: Negative for depression. The patient is not nervous/anxious.        Physical Exam   Nursing note and vitals reviewed.  Constitutional: She is oriented to person, place, and time. She appears well-developed and well-nourished.   HENT:   Head: Normocephalic and atraumatic.   Eyes: Conjunctivae and EOM are normal.   Neck: Normal range of motion. Neck supple.   Cardiovascular: Normal rate, regular rhythm and normal heart sounds.    No murmur heard.  Pulmonary/Chest: Effort normal and breath sounds normal.   Abdominal: Soft. Bowel sounds are normal. She exhibits no distension.   Musculoskeletal: Normal range of motion. She exhibits no edema.   Lymphadenopathy:     She has no cervical adenopathy.   Neurological: She is alert and oriented to person, place, and time.   Skin: No erythema.   Psychiatric: Her behavior is normal.       ASSESSMENT and PLAN  \\  1. Diabetes mellitus type II, uncontrolled  sitaGLIPtin-metFORMIN (JANUMET) 50-1,000 mg per tablet   2. Acid reflux  Omeprazole delayed release (PRILOSEC D/R) 20 mg tablet, simethicone 125 mg chewable tablet     Will decrease Janumet one in am and half in pm repeat in 4 months, There are no diagnoses linked to this encounter.  lab results and schedule of future lab studies reviewed with patient  reviewed diet, exercise and weight control  cardiovascular risk and specific lipid/LDL goals reviewed  reviewed medications and side effects in detail  use of aspirin to prevent MI and TIA's discussed

## 2012-02-12 NOTE — Telephone Encounter (Signed)
Verbal order per MD

## 2012-05-10 NOTE — Telephone Encounter (Signed)
Pt needs Rx refilled, per Dr Ashrafi ok to do so.

## 2012-05-15 NOTE — Telephone Encounter (Signed)
Pt having congestion, possible bronchitus, what can she take over the counter? Pt p# [phone] or [phone] (pt here now for labs and would like to know)

## 2012-05-15 NOTE — Telephone Encounter (Signed)
Patient advised to take Mucinex and use her inhaler.  If symptoms intensify she will call if we have cancellations or got to [facility].

## 2012-05-16 LAB — METABOLIC PANEL, COMPREHENSIVE
A-G Ratio: 1.1 (ref 1.1–2.5)
ALT (SGPT): 11 IU/L (ref 0–32)
AST (SGOT): 13 IU/L (ref 0–40)
Albumin: 3.7 g/dL (ref 3.6–4.8)
Alk. phosphatase: 58 IU/L (ref 39–117)
BUN/Creatinine ratio: 37 — ABNORMAL HIGH (ref 11–26)
BUN: 26 mg/dL (ref 8–27)
Bilirubin, total: 0.3 mg/dL (ref 0.0–1.2)
CO2: 25 mmol/L (ref 19–28)
Calcium: 8.9 mg/dL (ref 8.6–10.2)
Chloride: 104 mmol/L (ref 97–108)
Creatinine: 0.71 mg/dL (ref 0.57–1.00)
GFR est AA: 100 mL/min/{1.73_m2} (ref >59)
GFR est non-AA: 87 mL/min/{1.73_m2} (ref >59)
GLOBULIN, TOTAL: 3.4 g/dL (ref 1.5–4.5)
Glucose: 140 mg/dL — ABNORMAL HIGH (ref 65–99)
Potassium: 3.9 mmol/L (ref 3.5–5.2)
Protein, total: 7.1 g/dL (ref 6.0–8.5)
Sodium: 143 mmol/L (ref 134–144)

## 2012-05-16 LAB — HEMOGLOBIN A1C WITH EAG: Hemoglobin A1c: 6.4 % — ABNORMAL HIGH (ref 4.8–5.6)

## 2012-05-16 LAB — TSH 3RD GENERATION: TSH: 0.738 u[IU]/mL (ref 0.450–4.500)

## 2012-05-16 LAB — CBC W/O DIFF
HCT: 40 % (ref 34.0–46.6)
HGB: 12.6 g/dL (ref 11.1–15.9)
MCH: 27.9 pg (ref 26.6–33.0)
MCHC: 31.5 g/dL (ref 31.5–35.7)
MCV: 89 fL (ref 79–97)
PLATELET: 186 10*3/uL (ref 155–379)
RBC: 4.51 x10E6/uL (ref 3.77–5.28)
RDW: 14 % (ref 12.3–15.4)
WBC: 5.2 10*3/uL (ref 3.4–10.8)

## 2012-05-16 LAB — LIPID PANEL
Cholesterol, total: 149 mg/dL (ref 100–199)
HDL Cholesterol: 57 mg/dL (ref >39)
LDL, calculated: 78 mg/dL (ref 0–99)
Triglyceride: 72 mg/dL (ref 0–149)
VLDL, calculated: 14 mg/dL (ref 5–40)

## 2012-05-29 NOTE — Progress Notes (Signed)
HISTORY OF PRESENT ILLNESS  Allison Newman is a 72 y.o. female.  HPI  DMtype II    Compliant w/ meds, diabetic diet, and daily exercise, obtains home glucose monitoring averaging  120's  . No Rf needed for today. Denies any tingling sensation, polyurea and polydipsia, last a1c was      Ago and was this     . Last podiatry visit   And last eye exam was  2013, Last urine microalbumin     Ago and was   . Feeling better since the last visit.  Hypercholestremia  Compliant w/ meds, low fat, low cholesterol diet not a fast fooder, patient states that there is no muscle nor abdominal pain, looking at patient's face there is no Xanthelasmas,   And patient fasting today, + daily aspirin  HTN  Compliant w/ meds,+ low salt diet, and daily walking, non smoker, no home bp monitoring .No swelling no lightheadedness as per patient, not stressed out,  no RF needed today, otherwise feeling better since the last visit    Current Outpatient Prescriptions   Medication Sig Dispense Refill   ??? sitaGLIPtin-metFORMIN (JANUMET) 50-1,000 mg per tablet take 1 tablet by mouth twice a day with food  60 Tab  3   ??? simvastatin (ZOCOR) 40 mg tablet Take 0.5 Tabs by mouth nightly.  30 Tab  6   ??? triamterene-hydrochlorothiazide (DYAZIDE) 37.5-25 mg per capsule Take 1 Cap by mouth daily.  30 Cap  3   ??? amLODIPine (NORVASC) 10 mg tablet Take 1 Tab by mouth daily.  30 Tab  7   ??? metoprolol (LOPRESSOR) 50 mg tablet Take 1 Tab by mouth two (2) times a day.  60 Tab  7   ??? diclofenac EC (VOLTAREN) 75 mg EC tablet take 1 tablet by mouth twice a day  60 Tab  0   ??? glucose blood VI test strips (FREESTYLE LITE STRIPS) strip Test twice daily.  1 Package  11   ??? Omeprazole delayed release (PRILOSEC D/R) 20 mg tablet Take 1 Tab by mouth two (2) times a day. 30 min before meal twice daily  60 Tab  6   ??? ipratropium-albuterol (COMBIVENT RESPIMAT) 20-100 mcg/actuation inhaler Take 1 Puff by inhalation every six (6) hours.  1 Inhaler  6   ??? aspirin 81 mg tablet Take  81 mg by mouth.       ??? simethicone 125 mg chewable tablet Take 125 mg by mouth every six (6) hours as needed for Flatulence.  60 Tab  5     No Known Allergies  Past Medical History   Diagnosis Date   ??? Hypertension    ??? Diabetes    ??? Arthritis    ??? Gastrointestinal disorder      GERD   ??? 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, adult 08/28/2011   ??? 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 Elevated BUN 09/13/2011     Past Surgical History   Procedure Laterality Date   ??? Hx orthopaedic       knee replacement     No family history on file.  History   Substance Use Topics   ??? Smoking status: Never Smoker    ??? Smokeless tobacco: Not on file   ??? Alcohol Use: No        Component Value Date/Time   WBC 5.2 05/15/2012  9:02 AM   HGB 12.6 05/15/2012  9:02 AM   HCT  40.0 05/15/2012  9:02 AM   PLATELET 186 05/15/2012  9:02 AM   MCV 89 05/15/2012  9:02 AM       Component Value Date/Time   Hemoglobin A1c 6.4 05/15/2012  9:02 AM   Hemoglobin A1c 6.2 01/01/2012  8:45 AM   Hemoglobin A1c 7.6 08/28/2011 12:17 PM   Glucose 140 05/15/2012  9:02 AM   Glucose (POC) 143 01/03/2008  9:21 AM   LDL, calculated 78 05/15/2012  9:02 AM   Creatinine 0.71 05/15/2012  9:02 AM        Component Value Date/Time   Cholesterol, total 149 05/15/2012  9:02 AM   HDL Cholesterol 57 05/15/2012  9:02 AM   LDL, calculated 78 05/15/2012  9:02 AM   Triglyceride 72 05/15/2012  9:02 AM       Component Value Date/Time   GFR est AA 100 05/15/2012  9:02 AM   GFR est non-AA 87 05/15/2012  9:02 AM   Creatinine 0.71 05/15/2012  9:02 AM   BUN 26 05/15/2012  9:02 AM   Sodium 143 05/15/2012  9:02 AM   Potassium 3.9 05/15/2012  9:02 AM   Chloride 104 05/15/2012  9:02 AM   CO2 25 05/15/2012  9:02 AM        Component Value Date/Time   TSH 0.738 05/15/2012  9:02 AM      Lab Results   Component Value Date/Time    Glucose 140 05/15/2012  9:02 AM    Glucose (POC) 143 01/03/2008  9:21 AM         Review of Systems   Constitutional: Positive for chills. Negative for fever.   HENT:  Positive for congestion. Negative for ear pain and nosebleeds.    Eyes: Negative for blurred vision, pain and discharge.   Respiratory: Positive for cough. Negative for shortness of breath.    Cardiovascular: Negative for chest pain and leg swelling.   Gastrointestinal: Negative for nausea, vomiting, diarrhea and constipation.   Genitourinary: Negative for frequency.   Musculoskeletal: Negative for joint pain.   Skin: Negative for itching and rash.   Neurological: Positive for headaches.   Psychiatric/Behavioral: Negative for depression. The patient is not nervous/anxious.        Physical Exam   Nursing note and vitals reviewed.  Constitutional: She is oriented to person, place, and time. She appears well-developed and well-nourished.   HENT:   Head: Normocephalic and atraumatic.   Nose: Mucosal edema and rhinorrhea present.   Mouth/Throat: Mucous membranes are dry. Posterior oropharyngeal edema and posterior oropharyngeal erythema present.   Eyes: Conjunctivae and EOM are normal.   Neck: Normal range of motion. Neck supple.   Cardiovascular: Normal rate, regular rhythm and normal heart sounds.    No murmur heard.  Pulmonary/Chest: Effort normal and breath sounds normal.   Abdominal: Soft. Bowel sounds are normal. She exhibits no distension.   Musculoskeletal: Normal range of motion. She exhibits no edema.   Lymphadenopathy:     She has cervical adenopathy.   Neurological: She is alert and oriented to person, place, and time.   Skin: No erythema.   Psychiatric: Her behavior is normal.       ASSESSMENT and PLAN  Allison Newman was seen today for hypertension, diabetes and results.    Diagnoses and associated orders for this visit:    Diabetes mellitus type II, uncontrolled  - REFERRAL TO OPHTHALMOLOGY    HTN, goal below 130/80    Hypercholesterolemia    Acute bronchitis  - ipratropium-albuterol (  COMBIVENT RESPIMAT) 20-100 mcg/actuation inhaler; Take 1 Puff by inhalation every six (6) hours.    Other Orders  -  sitaGLIPtin-metFORMIN (JANUMET) 50-1,000 mg per tablet; take 1 tablet by mouth twice a day with food  - simvastatin (ZOCOR) 40 mg tablet; Take 0.5 Tabs by mouth nightly.  - triamterene-hydrochlorothiazide (DYAZIDE) 37.5-25 mg per capsule; Take 1 Cap by mouth daily.  - amLODIPine (NORVASC) 10 mg tablet; Take 1 Tab by mouth daily.  - metoprolol (LOPRESSOR) 50 mg tablet; Take 1 Tab by mouth two (2) times a day.  - azithromycin (ZITHROMAX) 250 mg tablet; For 5 days as directed      cont severe life style mod, it was emphasized and the patient was explained to do daily brisk walking 30 minutes most days of the week,  Avoid fatty  fast foods, have and continue with low-fat low-cholesterol diet, fatty fish to the diet 3-4 times per week and finally have a low-salt and K rich food intake,  will repeat A1c and the lipid panel in 3-6 months.    lab results and schedule of future lab studies reviewed with patient  reviewed diet, exercise and weight control  cardiovascular risk and specific lipid/LDL goals reviewed  reviewed medications and side effects in detail  use of aspirin to prevent MI and TIA's discussed

## 2012-05-29 NOTE — Progress Notes (Signed)
Chief Complaint   Patient presents with   ??? Hypertension     4 month follow up   ??? Diabetes   ??? Results

## 2012-06-06 ENCOUNTER — Encounter

## 2012-06-18 LAB — AMB POC URINALYSIS DIP STICK AUTO W/O MICRO
Bilirubin (UA POC): NEGATIVE
Blood (UA POC): NEGATIVE
Glucose (UA POC): NEGATIVE
Nitrites (UA POC): NEGATIVE
Protein (UA POC): NEGATIVE mg/dL
Specific gravity (UA POC): 1.015 (ref 1.001–1.035)
pH (UA POC): 5.5 (ref 4.6–8.0)

## 2012-06-18 NOTE — Patient Instructions (Signed)
It was good to see you today, Allison Newman.    Please let me know if there is any way our team can help you take good care of you and your family.      Maudie Mercury, DNP    Did you know that a good patient-provider relationship is directly linked to improved health outcomes?    Several high quality medical studies have demonstrated improved health outcomes when the patient feels that they have a strong relationship with their primary care provider. In fact, the health benefits of a good relationship with your provider can be as effective as several common cardiovascular prevention medications! Unlike medications, there are no negative side effects to a good patient-provider relationship!    [facility], M. (2014, April 11). "A good patient-clinician relationship can improve health outcomes. "Medical News Today. Retrieved from http://www.medicalnewstoday.com/releases/275321    Do something healthy this spring...and harvest the benefits for life!  Plant a garden!  Even if your garden is a single tomato plant in a plastic pot, it will yield the best tasting tomatoes you've ever had!          TEST RESULTS:     If laboratory tests or diagnostic imaging are ordered your test results will be available through "My Chart" shortly after the tests are completed. Please access "My Chart" through the following website:    [email or web address]    If any tests come back with particularly urgent results, we will attempt to call you; please make sure the front office is provided with accurate phone numbers.     If you do not have a computer or use "My Chart" a letter will be sent to you with test results. The letter will usually contain recommendations to improve your health. Please take the time to read these recommendations and note that sometimes a new medication will be required.     Please note that we will sometimes require another office visit to review laboratory or diagnostic testing results. We understand  that your time and co-payments are valuable. However, your good health is priceless and sometimes a face-to-face meeting is the most appropriate venue to discuss your individual results and plan care especially for you.     If you are ever concerned about your test results you are encouraged to make an appointment to discuss your results in detail and have all of your questions answered.     COMMUNICATION WITH YOUR MEDICAL HOME:     You can communicate directly with your healthcare provider electronically through "My Chart" by going to the following secure website:    [email or web address]    If you prefer to use the telephone, you can call our office at [phone] and ask to leave a message for your nurse.  Your nurse will be able speak directly with your healthcare provider to help get you the care you need.       If you have complex health issues you may also contact one of our Nurse Navigators.  Nurse Navigators are specially trained registered nurses who help patients complex diseases live as healthy as possible.  A Nurse Navigator can be reached directly at [phone] or [phone].        MEDICATIONS AND REFILL REQUESTS:     PLEASE bring ALL of your medications to every visit (THIS INCLUDES PRESCRIPTIONS, NON-PRESCRIPTIONS, VITAMINS, NUTRITION SUPPLEMENTS, HERBAL & OTHER NATURAL REMEDIES).  This allows [place] to review each drug and discuss if it is helping you the way that it  should.     ?? Please let your nurse know at the beginning of your visit if any medication refills are needed.   ?? If you need a refill and you are not coming into the office for an appointment soon, please call your pharmacist and ask them for a refill (your pharmacy can communicate with [place] electronically to make the healthcare system safer and more efficient).    ?? Refills can only be authorized if you have been seen in the last year; if it has been over one year since your last office visit please schedule  an office visit.   ?? Refill requests may take up to 5 days to process; please do not jeopardize your health and safety by waiting until the last minute.  ?? If a controlled substance is being prescribed, you need to allow 5 days to process and controlled substances cannot be filled after 4:30pm, on holidays, or weekends. On call providers do not have the ability to prescribe these medications (examples include mediations for pain, anxiety, ADHD, weight loss).    If you have any questions, let's discuss them at your next visit!        CONFIDENTIAL MENTAL HEALTH OR SUBSTANCE ABUSE HELP:     Need confidential help with mental health or substance abuse issues?   Call 1-800-662-HELP (939)712-2283) for the Substance Abuse and Mental Health Services Administration 24-hours-a-day, 365-days-a-year; or, visit their website at http://findtreatment.http://gonzalez-rivas.net/.     IN CASE OF EMERGENCY:     In the case of a medical/psychological emergency including:  ?? Chest pain/pressure  ?? Shortness of breath  ?? Uncontrolled bleeding  ?? Change in level of consciousness  ?? Sudden difficulty speaking  ?? Unilateral numbness or weakness of the face, arm or leg  ?? Domestic violence  ?? Thoughts or plans to hurt yourself or someone else    CALL 911 and GO TO THE NEAREST EMERGENCY DEPARTMENT IMMEDIATELY.    Be well...body, mind and spirit!     Maudie Mercury, DNP  [facility]  Provider ID: [id number]  [phone]    WHAT DOES YOUR HEMOGLOBIN A1C RESULT MEAN?    Your HEMOGLOBIN A1C (A1C) tells [place] how well your blood sugars have been controlled over the last 2-3 months.  The test measures how much sugar has collected on the outside of your red blood cells.   A simple blood sugar result tells [place] what your blood sugar is at one moment in time, but the A1C provides [place] with an ESTIMATED AVERAGE GLUCOSE (eAG) over the last 60-90 days (please see the conversion chart on the clipboard below).      Your most recent A1C:   Lab Results   Component Value  Date/Time    Hemoglobin A1c 6.4 05/15/2012  9:02 AM        We use the following formula to calculate your estimated average glucose:  eAG = (28.7 x A1c) - 46.7    Your eAG: 130 mg/dL     If you do NOT have Diabetes:      If your A1C is between 4.8-5.6 your blood sugars are very well controlled.     If your A1C is between 5.7-6.4 you are at increased risk for having diabetes.  We'll need to work on [employer] and increasing the amount of exercise you get to prevent you from developing diabetes.    If your A1C is 6.4 or greater, unfortunately, you now officially can be diagnosed with  diabetes.  We'll need to work on [employer], increasing the amount of exercise you get, and likely start some new medications.  Please schedule an appointment in the next two weeks so that we talk about our next steps.    If you DO have Diabetes:      If your A1C is below 7, you are doing a good job controlling your diabetes. Keep up the good work!    If your A1C is over 7, we have some work to do! You'll need to focus some more time and energy on your carbohydrate intake, your exercise schedule, and making sure you are taking your medicine correctly.

## 2012-06-18 NOTE — Progress Notes (Signed)
PATIENT-CENTERED MEDICAL HOME VISIT   New Patient, Acute Care and Chronic Disease Management     Name: Allison Newman MRN: [id number] SSN: [id number]    DOB: [birth year]  Age: 72 y.o.  Gender: female      CHIEF CONCERN(S):  (please see nursing notes for additional details)  1. Cough  2. Chest pain    IMPRESSION(S) & PLAN(S):     1. Chest discomfort  Status/Response to Current POC: New finding needing further evaluation  CDSM: Not established  CDSM Feedback: Not established  Goals-of-Care (GOC) via Shared Decision Making (SDM): Not established  Management Options: Multiple  Plan-of-Care (POC): Diagnostic testing, symptom log, and/or specialty referral (see orders)  Disposition/Follow-Up: Home; RTC for planned follow-up care & determination of GOC; ED for acute deterioration  - XR CHEST PA LAT; Future  - AMB POC EKG ROUTINE W/ 12 LEADS, INTER & REP- NO ACUTE ISCHEMIC CHANGES  - REFERRAL TO CARDIOLOGY    2. Cough  Status/Response to Current POC: New finding needing further evaluation  CDSM: Not established  CDSM Feedback: Not established  Goals-of-Care (GOC) via Shared Decision Making (SDM): Not established  Management Options: Multiple  Plan-of-Care (POC): Diagnostic testing, symptom log, and/or specialty referral (see orders)  Disposition/Follow-Up: Home; RTC for planned follow-up care & determination of GOC; ED for acute deterioration  - XR CHEST PA LAT; Future  - fluticasone (FLONASE) 50 mcg/actuation nasal spray; 2 sprays each nostril BID x 5 days; then once daily PRN  Dispense: 1 Bottle; Refill: 4    3. Cardiomegaly  - AS ABOVE for Chest Discomfort  - AMB POC EKG ROUTINE W/ 12 LEADS, INTER & REP  - REFERRAL TO CARDIOLOGY    4. Diabetes mellitus type II, uncontrolled  Lab Results   Component Value Date/Time    Hemoglobin A1c 6.4 05/15/2012  9:02 AM    Hemoglobin A1c 6.2 01/01/2012  8:45 AM    Hemoglobin A1c 7.6 08/28/2011 12:17 PM    Glucose 140 05/15/2012  9:02 AM    Glucose (POC) 143 01/03/2008  9:21 AM    LDL,  calculated 78 05/15/2012  9:02 AM    Creatinine 0.71 05/15/2012  9:02 AM   - MICROALBUMIN:CREATININE RATIO, RANDOM URINE (never done)  - AMB POC URINALYSIS DIP STICK AUTO W/O MICRO  - CONSIDER ACE INHIBITOR WITH DM, HYPERLIPIDEMIA, & HTN, will await microalbumin results    5. 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, adult  Status/Response to Current POC: Stable controlled disease/condition  CDSM: Effective chronic disease self-management  CDSM Feedback: Commended on stability/improved health  GOC via SDM: Unchanged goals-of-care  Management Options: Limited/Multiple  Revisions to POC: Largely unchanged plan-of-care  Disposition/Follow-Up: Home; RTC for regular planned health promotion visits & PRN for exacerbation management; ED for acute deterioration  Anthropometric:   Weight Loss Metrics 05/29/2012 01/15/2012 12/12/2011 09/13/2011 08/28/2011   Today's Wt 353 lb 343 lb 343 lb 342 lb 341 lb   BMI 62.55 kg/m2 60.78 kg/m2 60.78 kg/m2 60.6 kg/m2 60.42 kg/m2     6. HTN, goal below 130/80  BP Readings from Last 3 Encounters:   06/18/12 137/70   05/29/12 150/85   01/15/12 130/92   Status/Response to Current POC: Stable controlled disease/condition  CDSM: Effective chronic disease self-management  CDSM Feedback: Commended on stability/improved health  GOC via SDM: Unchanged goals-of-care  Management Options: Limited/Multiple  Revisions to POC: Largely unchanged plan-of-care  Disposition/Follow-Up: Home; RTC for regular planned health promotion visits & PRN for exacerbation management; ED for acute  deterioration  - MICROALBUMIN:CREATININE RATIO, RANDOM URINE  - AMB POC EKG ROUTINE W/ 12 LEADS, INTER & REP  - AMB POC URINALYSIS DIP STICK AUTO W/O MICRO    7. Chronic allergic rhinitis  Status/Response to Current POC: New finding needing further evaluation  CDSM: Not established  CDSM Feedback: Not established  Goals-of-Care (GOC) via Shared Decision Making (SDM): Not established  Management Options: Multiple  Plan-of-Care (POC):  Diagnostic testing, symptom log, and/or specialty referral (see orders)  Disposition/Follow-Up: Home; RTC for planned follow-up care & determination of GOC; ED for acute deterioration  - fluticasone (FLONASE) 50 mcg/actuation nasal spray; 2 sprays each nostril BID x 5 days; then once daily PRN  Dispense: 1 Bottle; Refill: 4    8. PND (post-nasal drip)  - AS ABOVE  - fluticasone (FLONASE) 50 mcg/actuation nasal spray; 2 sprays each nostril BID x 5 days; then once daily PRN  Dispense: 1 Bottle; Refill: 4    9. Acid reflux  Status/Response to Current POC: Stable controlled disease/condition  CDSM: Effective chronic disease self-management  CDSM Feedback: Commended on stability/improved health  GOC via SDM: Unchanged goals-of-care  Management Options: Limited/Multiple  Revisions to POC: Largely unchanged plan-of-care  Disposition/Follow-Up: Home; RTC for regular planned health promotion visits & PRN for exacerbation management; ED for acute deterioration  - simethicone 125 mg chewable tablet; Take 125 mg by mouth every six (6) hours as needed (gas).  Dispense: 60 Tab; Refill: 5    10. Adenopathy, hilar  Status/Response to Current POC: New finding needing further evaluation  CDSM: Not established  CDSM Feedback: Not established  Goals-of-Care (GOC) via Shared Decision Making (SDM): Not established  Management Options: Multiple  Plan-of-Care (POC): Diagnostic testing, symptom log, and/or specialty referral (see orders)  Disposition/Follow-Up: Home; RTC for planned follow-up care & determination of GOC; ED for acute deterioration  - XR CHEST PA LAT   - NO FAMILY HX OF SARCOIDOSIS  - NONSMOKER  - STRONG FAMILY HX OF BREAST CA x 2 SISTERS  - FATHER LUNG CA S/T TOBACCO USE  - CT CHEST W CONT; Future    FOLLOW UP:  Acute Care: As per diagnosis specific disposition above & PRN for injury/illness  Chronic Disease Management: As per diagnosis specific disposition/follow-up above   Health Promotion: Annual wellness visit    BEST  WAY TO CONTACT PATIENT:   Allison Newman prefers the following communication as a secure means to communicate protected health information including diagnostic test results, medication management, recommended/prescribed treatment, self-care, referrals, and mental health information.    Patient prefers telephone call at  [phone] (home)    SUBJECTIVE DATA     HISTORY OF PRESENT ILLNESS:   Allison Newman is a 72 y.o., SINGLE, NON-HISPANIC female who presents today for the chief complaint(s) identified above. History is provided primarily by the patient.    Allison Newman has multiple chronic conditions that affect or potentially affect current health. She describes her historic adherence to the prescribed plan-of-care (POC) as compliant most of the time. The patient and/or caregiver perceive(s) current overall health status as good.     Allison Newman is not known to me from previous encounters. Allison Newman's primary care provider is Purnell Shoemaker, MD.     Pertinent, new, changing, ongoing, or recently resolved patient/family health concerns include:    Somatic Concerns  Chest Pain with cough: Patient complains of chest pain/soreness. Onset was 1 week ago, with improving course since that time.  The patient admits to  chest discomfort that is intermittent, with radiation to left shoulder, rated as a scale of 3/10 in intensity that is pressure, "gas-like" in nature.  Denies "heartburn." Associated symptoms are none. Aggravating factors are large meals or housework.  Alleviating factors are: nothing.   ?? Patient does not not have a cardiologist that follows.   ?? Patient's cardiac risk factors are dyslipidemia, diabetes mellitus, obesity, sedentary life style, hypertension, post-menopausal.    ?? Patient's risk factors for DVT/PE: None.   ?? Previous cardiac testing includes: Echocardiogram, Exercise Stress Test at [facility] > 50yrs ago; no records available.    Behavioral-Psychological-Social Concerns  No identified  concerns today.    Overt Safety Concerns  No acute safety concerns are identified today.     REVIEW OF SYSTEMS:   A comprehensive ROS was conducted; pertinent positives are identified below and was otherwise negative except as outlined in the HPI.   Constitutional: ??  Neurologic: ??  Psychiatric: denies anxiety and depression  Heme/Lymph: ??  Endocrine: ??  Eyes: ??  Ears, Nose, Oropharynx: hoarseness and "clear my throat all the time"   Breast: ??  Respiratory: cough largely dry, very little sputum  Cardiovascular: Patient denies any exertional chest pain, dyspnea, palpitations, syncope, orthopnea, edema or paroxysmal nocturnal dyspnea.  Gastrointestinal: dyspepsia and "Gas"   Urological: ??  Reproductive: ??  Musculoskeletal: ??   Dermatological: ??    IMMUNIZATIONS:  Up to date, no vaccines indicated today    There is no immunization history on file for this patient.    ALLERGIES:   No Known Allergies    CURRENT MEDICALLY RELEVANT CONDITIONS:  Patient Active Problem List   Diagnosis Code   ??? HTN, goal below 130/80 401.9   ??? Diabetes mellitus type II, uncontrolled 250.02   ??? 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, adult 278.01, V85.44   ??? Neuropathic arthritis 094.0, 713.5   ??? Elevated BUN 790.6   ??? Low TSH level 794.5   ??? Abnormal urine finding 791.9   ??? Acute bronchitis 466.0   ??? Acid reflux 530.81   ??? Hypercholesterolemia 272.0       CURRENT MEDICATIONS:   Current Outpatient Prescriptions   Medication Sig   ??? sitaGLIPtin-metFORMIN (JANUMET) 50-1,000 mg per tablet take 1 tablet by mouth twice a day with food   ??? simvastatin (ZOCOR) 40 mg tablet Take 0.5 Tabs by mouth nightly.   ??? triamterene-hydrochlorothiazide (DYAZIDE) 37.5-25 mg per capsule Take 1 Cap by mouth daily.   ??? amLODIPine (NORVASC) 10 mg tablet Take 1 Tab by mouth daily.   ??? metoprolol (LOPRESSOR) 50 mg tablet Take 1 Tab by mouth two (2) times a day.   ??? ipratropium-albuterol (COMBIVENT RESPIMAT) 20-100 mcg/actuation inhaler Take 1 Puff by inhalation every six  (6) hours.   ??? diclofenac EC (VOLTAREN) 75 mg EC tablet take 1 tablet by mouth twice a day   ??? glucose blood VI test strips (FREESTYLE LITE STRIPS) strip Test twice daily.   ??? Omeprazole delayed release (PRILOSEC D/R) 20 mg tablet Take 1 Tab by mouth two (2) times a day. 30 min before meal twice daily   ??? simethicone 125 mg chewable tablet Take 125 mg by mouth every six (6) hours as needed for Flatulence.   ??? aspirin 81 mg tablet Take 81 mg by mouth.     No current facility-administered medications for this visit.       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 She does not have any smokeless tobacco history on file.  Alcohol:  Allison Newman reports that she does not drink alcohol.  Other Substance Abuse:   Allison Newman reports that she does not use illicit drugs.    OBJECTIVE DATA   I have reviewed the patient's health history in detail and updated the electronic health record as indicated including the problem list, allergy profile, current medications, immunization record, social history, surgical/procedural history, medical history (including mental health), and pertinent family family history is not on file.Marland Kitchen    PHYSICAL EXAM:  Vital Signs: BP 137/70   Pulse 73   Temp(Src) 98.5 ??F (36.9 ??C) (Oral)   Resp 16   Ht 5\' 3"  (1.6 m)   SpO2 96%  Anthropometric:   Weight Loss Metrics 05/29/2012 01/15/2012 12/12/2011  09/13/2011 08/28/2011   Today's Wt 353 lb 343 lb 343 lb 342 lb 341 lb   BMI 62.55 kg/m2 60.78 kg/m2 60.78 kg/m2 60.6 kg/m2 60.42 kg/m2   10# weight gain since November  General: appropriately dressed; well groomed with evidence of good hygiene  Constitutional: non-toxic appearing; no acute distress; well developed/well nourished  Neurologic: developmentally appropriate; alert and oriented to person, place, time, and events; gait adequately balanced without aid/assistive device; alert, oriented, normal speech, no focal findings or movement disorder noted  Psychiatric: mood appropriate without overt depression and without overt anxiety; affect is mood-congruent; reasonable historian via provision of elicited data; reasonable judgement; reasonable insight  Head: normocephalic, atraumatic; allergic facies is not present  Eyes: anicteric sclerae; lids without erythema, inflammation or ptosis; palpebral conjunctiva with injection and edema; Dennie-Morgan lines are present; allergic shiners are apparent (consistent with nasal symptoms)    Ears: external ear (pinna, tragus, lobule) without anomaly bilaterally, mild retraction to bilateral TM with thin serous exudates bilaterally  Nose: external nose (bridge, tip, ala) without gross anomaly, there is no nasal crease; nares patent, bilateral turbinates are hypertrophied with some pallor and clear rhinorrhea; nasal septum intact with deviation   Oropharynx: pink, moist, no cheilitis; there is cobblestoning (lymphoid deposition) of the posterior pharynx; tonsils without hypertrophy or exudates; soft palate with petechiae consistent with self-induced negative oral pressure via caudal movement of posterior tongue in order to ease palatal pruritis associated with allergies   Neck: atraumatic; full ROM; no gross thyromegaly; no cervical lymphadenopathy   Chest: no supraclavicular lymphadenopathy  Breast: deferred exam  Respiratory: eupneic, non-labored respirations; no stridor; no  audible wheeze; bilateral anterior, lateral, and posterior fields auscultated, breath sounds are clear  Cardiovascular: evidence of adequate distal perfusion; eucardia with regular rhythm; distant S1, S2 auscultated, no murmur, rub, or gallop; no carotid bruits are heard  Abdomen:  soft, non-tender, non-distended  Genitourinary: no CVAT, no suprapubic tenderness; genitalia not examined   Musculoskeletal: musculature grossly symmetric without atrophy or skeletal anomaly   Dermatologic:   Skin- color, temperature, texture, & turgor are physiologic for age, gender, and ethnicity, no obvious suspicious lesions, no evidence of bleeding, bruising, or rash  Hair-  physiologic distribution, texture, & color are physiologic for age, gender, and ethnicity  Finger Nails- no clubbing, onycholysis, or trauma  Toe Nails- not examined  Feet- warm, good capillary refill, normal DP and PT pulses    LABORATORY DATA:   Historic laboratory data reviewed as available and medically indicated; please see orders/results for additional tests/results from today's visit.  - LABORATORY TESTING ORDERED, RESULTS PENDING    IMAGING DATA:  Historic imaging data reviewed as available and medically indicated; please see orders/results for additional tests/results from today's visit.  - I HAVE PERSONALLY REVIEWED IMAGES & DISCUSSED DIAGNOSTIC TESTING RESULTS WITH PATIENT/FAMILY IN DETAIL IN ORDER TO AID IN THEIR UNDERSTANDING & KNOWLEDGE OF HEALTH ASSETS & LIABILITIES. THIS SHOULD ENHANCE THE QUALITY OF SHARED DECISION MAKING & IMPROVE ADHERENCE TO MUTUALLY AGREED UPON PLAN-OF-CARE    PROCEDURES:    Counseling, Shared Decision Making, and Coordination of Care   41 minutes Total visit time spent in direct patient care, counseling, & coordination of care   x  >50% of visit time was spent counseling & coordinating care   New   Patient  ? 60 min  [id number]  45-59 min  [id number]  30-44 min  [id number]  20-30 min  [id number]     Transitional Care Management  -Patient  communication within 2  business days of discharge  -MDM >/= Moderate complexity  -Face-to-Face within 14 calendar days of discharge [phone]  -Communication with patient within 2 business  days of discharge  -MDM >/= Moderate complexity  -Face-to-Face within 7 calendar days of   discharge [phone]   Preventative Counseling  ? 60 min  57846  45-59 min  [id number]  30-44 min  28413  15-29 min  24401   Established   Patient x ? 40 min  02725  25-39 min  36644  15-24 min  03474  10-14 min  25956    Prolonged services, first 60 min beyond usual service in the outpatient clinic  [id number]    Prolonged services, each add. 30 min beyond usual service in the outpatient clinic [id number]   x Discussed diagnosis, prognosis, and available treatments/diagnostics including option to elect no treatment/no further work-up   x Conducted a disease/condition specific benefit-burden-analysis (BBA) including frank discussion of humanistic and economic costs   x Determined goals-of-care (GOC) through shared decision making (SDM) with respect to clinical evidence, individual patient/family needs, abilities, preferences, and values   x Determined plan-of-care (POC) based on previously determined GOC with continued respect to individual patient/family needs, abilities, preferences, and values   x Individualized patient and/or family instruction provided through discussion, demonstration, "After Visit Summary," supplemental literature, and/or electronic media to include:  ?? Signs/symptoms of disease progression/regression  ?? Self management of disease process and/or symptom palliation   ?? Need for ongoing supportive medical care  ?? Indicators that warrant a change to the POC  ?? Indicators that warrant emergency care   x Stressed the importance of making/maintaining therapeutic lifestyle changes (TLC) to reduce risk of disease progression or development of co-morbid disease processes   x Empowered patient/family through: (1) positively reinforced health  promoting choices including presenting for today's visit; (2) verbalized an optimistic belief in the patient's ability to make and sustain lifestyle changes to achieve improved health; and (3), reviewed and reinforced health wellness team concept with members (specifically, the patient and the provider) sharing accountability for health outcomes   x Coordination of care to include discussing, planning, or scheduling additional and/or supporting services with  another provider, a complex care nurse navigator, counseling, therapy, and/or other social agencies:  Referral to cardiology  Referral for CT chest with hilar lymphadenopathy     COMMUNICATION WITH MEDICAL HOME     For appointments, referrals, or billing issues please call us at [phone] to speak with administrative staff.    For questions or concerns about medications, the patient was encouraged to speak with their personal pharmacist or contact our office.     For clinical needs, Allison Newman was informed that they should contact us through "My Chart" at [email or web address] or call our office at [phone] and leave a message for a clinical team member.     For assistance with complex care, health education needs, shared decision making, or coordination of care assistance patients should leave a message for one of our Nurse Navigators directly at [phone] or [phone].    If the matter is of an urgent nature, the patient should call and ask to speak directly with their nurse (Ms. Loura Back. Joselyn Glassman is the nurse that regularly works with me).    During business hours, the patient has been empowered to ask to speak directly with their provider if they believe their health or safety is imminently jeopardized without such a conversation.  For urgent healthcare needs after hours there is a healthcare provider on call 24 hours a day, please call our office number at [phone] and follow instructions.           The  patient has been instructed that if she experiences chest pain/pressure, shortness of breath, uncontrolled bleeding, loss or change in level of consciousness, sudden difficulty speaking, unilateral numbness or weakness of the face, arm or leg, or is having thoughts of hurting themselves or others please call 911 immediately and go to the nearest emergency room.       Roxy Manns, DNP  [facility]  At [facility]  [id number], [place] [phone]  Provider ID: [id number]

## 2012-06-18 NOTE — Progress Notes (Signed)
Patient in today with complaints of a cough and chest pain x 1 month.

## 2012-06-19 LAB — MICROALBUMIN:CREATININE RATIO, RANDOM URINE
Creatinine, urine random: 172.3 mg/dL (ref 15.0–278.0)
Microalb/Creat ratio (ug/mg creat.): 6.7 mg/g creat (ref 0.0–30.0)
Microalbumin, urine: 11.6 ug/mL (ref 0.0–17.0)

## 2012-06-19 NOTE — Progress Notes (Signed)
Quick Note:    I have received and reviewed results and have analyzed them in a patient-specific historical context as possible and/or medically indicated. Patient notified of results via TC; CT ordered. cpb  ______

## 2012-06-25 ENCOUNTER — Telehealth

## 2012-06-25 NOTE — Telephone Encounter (Signed)
Telephone call to patient/caregiver to discuss diagnostic test results. Offered suggestions for prevention and/or treatment of chronic disease. Allowed time for, and answered, all questions.    Discussed:   Findings of CT scan chest reveal small nodule URL 3.40mm; no need for FU.  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 Hypercholesterolemia 272.0 LIPID PANEL     METABOLIC PANEL, COMPREHENSIVE   3. HTN, goal below 130/80 401.9 METABOLIC PANEL, COMPREHENSIVE   4. Acid reflux 530.81        Changes to Plan-of-Care via Shared Decision-Making:  Discussed results and options, agreed to move forward with thyroid testing.     Follow-Up:  Please return to center for next scheduled health maintenance visit or sooner as needed for injury or illness.    She will come by on Thursday morning around 9:00am for labwork.   Labs ordered, see note above.         RESULTS:    Examination: CT THORAX WO CON - [id number] - Jun 25 2012 11:05AM   Accession No: [id number]   Reason: suspected hilar lymphadenopathy   REPORT:   ADDITIONAL INDICATION: suspected hilar lymphadenopathy.   EXAM: CT THORAX WITHOUT CONTRAST.   COMPARISON: Chest radiograph 06/18/2012   TECHNIQUE: Intravenous access could not be obtained. Unenhanced multislice   helical CT was performed from the thoracic inlet to the adrenal glands   without intravenous contrast administration. Contiguous 5 mm axial images   were reconstructed and lung and soft tissue windows were generated. Coronal   reformations were generated.   FINDINGS: A 3.5 mm nodule is suggested in the right lung apex although there   is breathing motion artifact. There is minimal atelectasis or scar in the   lingula.   The lungs are otherwise clear of mass, nodule, airspace disease or edema.   The absence of intravenous contrast reduces the  sensitivity for evaluation   of the mediastinum and upper abdominal organs. No mediastinal or hilar   lymphadenopathy is grossly suspected. The thyroid gland is enlarged, right   greater than left, with extension into the mediastinum. There is no   compression of the airway.   The aorta has a normal contour with no evidence of aneurysm.   No mediastinal or hilar or axillary adenopathy is appreciated without   contrast.   The visualized portions of the upper abdominal organs are normal.   IMPRESSION:   1. No mediastinal, hilar or axillary lymphadenopathy without contrast.   2. 3.5 mm nodule in the right upper lobe. Guidelines by the Fleischner   society (Radiology 2005: [id number]) suggest that patients with a low risk   for lung cancer who have nodules less than or equal to 4 mm in diameter   require no follow-up. In patients with a higher risk, such as smokers,   follow-up is recommended in one year. Patients with a known malignancy are   at increased risk for metastasis and should receive three month follow-up.   3. Intrathoracic goiter.   Signing/Reading Doctor: Creed Copper [phone])

## 2012-06-26 NOTE — Telephone Encounter (Signed)
Patient Allison Newman left a message on the referral declining her cardiology appt scheduled with Dr. Marval Regal for Friday, June 28, 2012.  Patient stated she informed the doctor that she did not want to see a Cardiologist.   This appt was cancelled @ the patient's request.   Raynelle Chary

## 2012-06-27 NOTE — Addendum Note (Signed)
Addended by: Reather Laurence on: 06/27/2012 09:29 AM     Modules accepted: Orders

## 2012-06-27 NOTE — Addendum Note (Signed)
Addended by: Nehemiah Massed on: 06/27/2012 01:10 PM     Modules accepted: Level of Service

## 2012-06-27 NOTE — Progress Notes (Signed)
Patient in today for labs only.

## 2012-06-28 LAB — CBC WITH AUTOMATED DIFF
ABS. BASOPHILS: 0 10*3/uL (ref 0.0–0.2)
ABS. EOSINOPHILS: 0.3 10*3/uL (ref 0.0–0.4)
ABS. IMM. GRANS.: 0 10*3/uL (ref 0.0–0.1)
ABS. MONOCYTES: 0.6 10*3/uL (ref 0.1–0.9)
ABS. NEUTROPHILS: 4.1 10*3/uL (ref 1.4–7.0)
Abs Lymphocytes: 1.3 10*3/uL (ref 0.7–3.1)
BASOPHILS: 1 % (ref 0–3)
EOSINOPHILS: 4 % (ref 0–5)
HCT: 39.8 % (ref 34.0–46.6)
HGB: 12.7 g/dL (ref 11.1–15.9)
IMMATURE GRANULOCYTES: 0 % (ref 0–2)
Lymphocytes: 21 % (ref 14–46)
MCH: 28.3 pg (ref 26.6–33.0)
MCHC: 31.9 g/dL (ref 31.5–35.7)
MCV: 89 fL (ref 79–97)
MONOCYTES: 9 % (ref 4–12)
NEUTROPHILS: 65 % (ref 40–74)
PLATELET: 173 10*3/uL (ref 155–379)
RBC: 4.48 x10E6/uL (ref 3.77–5.28)
RDW: 14.4 % (ref 12.3–15.4)
WBC: 6.4 10*3/uL (ref 3.4–10.8)

## 2012-06-28 LAB — T4 AND TSH
T4, Total: 8.9 ug/dL (ref 4.5–12.0)
TSH: 0.592 u[IU]/mL (ref 0.450–4.500)

## 2012-06-28 LAB — METABOLIC PANEL, COMPREHENSIVE
A-G Ratio: 1.1 (ref 1.1–2.5)
ALT (SGPT): 11 IU/L (ref 0–32)
AST (SGOT): 14 IU/L (ref 0–40)
Albumin: 3.7 g/dL (ref 3.6–4.8)
Alk. phosphatase: 58 IU/L (ref 39–117)
BUN/Creatinine ratio: 35 — ABNORMAL HIGH (ref 11–26)
BUN: 25 mg/dL (ref 8–27)
Bilirubin, total: 0.4 mg/dL (ref 0.0–1.2)
CO2: 22 mmol/L (ref 19–28)
Calcium: 9.6 mg/dL (ref 8.6–10.2)
Chloride: 104 mmol/L (ref 97–108)
Creatinine: 0.72 mg/dL (ref 0.57–1.00)
GFR est AA: 99 mL/min/{1.73_m2} (ref >59)
GFR est non-AA: 86 mL/min/{1.73_m2} (ref >59)
GLOBULIN, TOTAL: 3.3 g/dL (ref 1.5–4.5)
Glucose: 140 mg/dL — ABNORMAL HIGH (ref 65–99)
Potassium: 4 mmol/L (ref 3.5–5.2)
Protein, total: 7 g/dL (ref 6.0–8.5)
Sodium: 141 mmol/L (ref 134–144)

## 2012-06-28 LAB — T3 TOTAL: T3, total: 100 ng/dL (ref 71–180)

## 2012-06-28 LAB — CALCITONIN: Calcitonin, Serum: <2 pg/mL (ref 0.0–5.0)

## 2012-06-28 LAB — LIPID PANEL
Cholesterol, total: 176 mg/dL (ref 100–199)
HDL Cholesterol: 58 mg/dL (ref >39)
LDL, calculated: 102 mg/dL — ABNORMAL HIGH (ref 0–99)
Triglyceride: 79 mg/dL (ref 0–149)
VLDL, calculated: 16 mg/dL (ref 5–40)

## 2012-06-28 NOTE — Progress Notes (Signed)
Allison Newman is a 72 y.o. female who presents today for diagnostic testing only. Will review results and adjust plan-of-care as clinically indicated.   cpb

## 2012-07-11 NOTE — Telephone Encounter (Signed)
Patient is requesting a refill on her diclofenac EC (VOLTAREN) 75 mg EC tablet.    [facility]   [phone]    Best # to contact patient: [phone]

## 2012-07-12 NOTE — Telephone Encounter (Signed)
Per Dr. Demetrios Isaacs,     He refilled this medication and not to exceed 20 per month.

## 2012-08-03 NOTE — Progress Notes (Signed)
Quick Note:    Conveyed to patient  ______

## 2012-08-09 NOTE — Telephone Encounter (Signed)
Patient wants a return call regarding lab results.   Please give her a call @ [phone]  Cell [phone]

## 2012-08-12 NOTE — Telephone Encounter (Signed)
Patient wants a return call regarding her medication diclofenac EC (VOLTAREN) 75 mg EC.  Please give her a call @ [phone]

## 2012-08-12 NOTE — Telephone Encounter (Signed)
Spoke with pt regarding refills - Per Dr. Demetrios Isaacs pt is to get no more than 20 pills per month.

## 2012-08-12 NOTE — Telephone Encounter (Signed)
Per Dr. Demetrios Isaacs - ok to do refill as long as number of pills doesn't exceed 20.

## 2012-08-28 LAB — AMB POC URINALYSIS DIP STICK AUTO W/O MICRO
Bilirubin (UA POC): NEGATIVE
Glucose (UA POC): NEGATIVE
Ketones (UA POC): NEGATIVE
Nitrites (UA POC): NEGATIVE
Protein (UA POC): NEGATIVE mg/dL
Specific gravity (UA POC): 1.03 (ref 1.001–1.035)
pH (UA POC): 5.5 (ref 4.6–8.0)

## 2012-08-28 NOTE — Progress Notes (Signed)
HISTORY OF PRESENT ILLNESS  Allison Newman is a 72 y.o. female.  HPI  DMtype II    Compliant w/ meds, last visit the diabetic meds were changed,having the diabetic diet, and daily exercise, obtains home glucose monitoring averaging 116's . No Rf needed for today. Denies any tingling sensation, polyurea and polydipsia, last a1c was 6.4%%. Last podiatry visit   And last eye exam was 2013  . Feeling better since the last visit.    HTN  Compliant w/ meds, having a low salt diet, and daily walking, non smoker, no home bp monitoring.No swelling no lightheadedness as per patient, not stressed out, no RF needed today, otherwise feeling better since the last visit  Back,  the left knee pain  and with hx of rt knee replacement  [name]. This is a chronic problem. Was taking voltaren 75 mg bid, recently changed from bid daily to 20-30 tabs prn  Episode onset: 3 yrs ago, +morbid obese, the pain is worsens by going up and down the steps . The problem occurs constantly. The problem has changed and worsening since onset. The pain is present in the lower back. The quality of the pain is described as dull. The pain is at a severity of 8/10 w/out the pain meds. Associated symptoms include limited range of motion. Pertinent negatives include no numbness, + stiffness, no  tingling b/l, no itching, + back pain and no neck pain.no incontinence of urine nor of stool    Current Outpatient Prescriptions   Medication Sig Dispense Refill   ??? diclofenac EC (VOLTAREN) 75 mg EC tablet take 1 tablet by mouth twice a day  20 Tab  3   ??? simethicone 125 mg chewable tablet Take 125 mg by mouth every six (6) hours as needed (gas).  60 Tab  5   ??? loratadine (CLARITIN) 10 mg tablet Take 1 Tab by mouth daily.  30 Tab  5   ??? sitaGLIPtin-metFORMIN (JANUMET) 50-1,000 mg per tablet take 1 tablet by mouth twice a day with food  60 Tab  3   ??? simvastatin (ZOCOR) 40 mg tablet Take 0.5 Tabs by mouth nightly.  30 Tab  6   ???  triamterene-hydrochlorothiazide (DYAZIDE) 37.5-25 mg per capsule Take 1 Cap by mouth daily.  30 Cap  3   ??? amLODIPine (NORVASC) 10 mg tablet Take 1 Tab by mouth daily.  30 Tab  7   ??? ipratropium-albuterol (COMBIVENT RESPIMAT) 20-100 mcg/actuation inhaler Take 1 Puff by inhalation every six (6) hours.  1 Inhaler  6   ??? glucose blood VI test strips (FREESTYLE LITE STRIPS) strip Test twice daily.  1 Package  11   ??? Omeprazole delayed release (PRILOSEC D/R) 20 mg tablet Take 1 Tab by mouth two (2) times a day. 30 min before meal twice daily  60 Tab  6   ??? aspirin 81 mg tablet Take 81 mg by mouth.       ??? fluticasone (FLONASE) 50 mcg/actuation nasal spray 2 sprays each nostril BID x 5 days; then once daily PRN  1 Bottle  4   ??? metoprolol (LOPRESSOR) 50 mg tablet Take 1 Tab by mouth two (2) times a day.  60 Tab  7     No Known Allergies  Past Medical History   Diagnosis Date   ??? Hypertension    ??? Diabetes    ??? Arthritis    ??? Gastrointestinal disorder  GERD   ??? 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, adult 08/28/2011   ??? 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 Elevated BUN 09/13/2011     Past Surgical History   Procedure Laterality Date   ??? Hx orthopaedic       knee replacement     No family history on file.  History   Substance Use Topics   ??? Smoking status: Never Smoker    ??? Smokeless tobacco: Not on file   ??? Alcohol Use: No        Component Value Date/Time   WBC 6.4 06/27/2012  9:33 AM   HGB 12.7 06/27/2012  9:33 AM   HCT 39.8 06/27/2012  9:33 AM   PLATELET 173 06/27/2012  9:33 AM   MCV 89 06/27/2012  9:33 AM       Component Value Date/Time   Hemoglobin A1c 6.4 05/15/2012  9:02 AM   Hemoglobin A1c 6.2 01/01/2012  8:45 AM   Hemoglobin A1c 7.6 08/28/2011 12:17 PM   Glucose 140 06/27/2012  9:33 AM   Glucose (POC) 143 01/03/2008  9:21 AM   Microalb/Creat ratio (ug/mg creat.) 6.7 06/18/2012  4:20 PM   LDL, calculated 102 06/27/2012  9:33 AM   Creatinine 0.72 06/27/2012  9:33 AM        Component Value  Date/Time   Cholesterol, total 176 06/27/2012  9:33 AM   HDL Cholesterol 58 06/27/2012  9:33 AM   LDL, calculated 102 06/27/2012  9:33 AM   Triglyceride 79 06/27/2012  9:33 AM       Component Value Date/Time   ALT 11 06/27/2012  9:33 AM   AST 14 06/27/2012  9:33 AM   Alk. phosphatase 58 06/27/2012  9:33 AM   Bilirubin, total 0.4 06/27/2012  9:33 AM       Component Value Date/Time   GFR est AA 99 06/27/2012  9:33 AM   GFR est non-AA 86 06/27/2012  9:33 AM   Creatinine 0.72 06/27/2012  9:33 AM   BUN 25 06/27/2012  9:33 AM   Sodium 141 06/27/2012  9:33 AM   Potassium 4.0 06/27/2012  9:33 AM   Chloride 104 06/27/2012  9:33 AM   CO2 22 06/27/2012  9:33 AM        Component Value Date/Time   TSH 0.592 06/27/2012  9:33 AM   T4 8.9 06/27/2012  9:33 AM         Review of Systems   Constitutional: Negative for fever and chills.   HENT: Negative for ear pain and nosebleeds.    Eyes: Negative for blurred vision, pain and discharge.   Respiratory: Negative for shortness of breath.    Cardiovascular: Negative for chest pain and leg swelling.   Gastrointestinal: Negative for nausea, vomiting, diarrhea and constipation.   Genitourinary: Negative for frequency.   Musculoskeletal: Positive for myalgias, back pain and joint pain.   Skin: Negative for itching and rash.   Neurological: Negative for headaches.   Psychiatric/Behavioral: Negative for depression. The patient has insomnia. The patient is not nervous/anxious.        Physical Exam   Nursing note and vitals reviewed.  Constitutional: She is oriented to person, place, and time. She appears well-developed and well-nourished.   HENT:   Head: Normocephalic and atraumatic.   Eyes: Conjunctivae and EOM are normal. Pupils are equal, round, and reactive to light.   Neck: No JVD present. No thyromegaly present.   Cardiovascular: Normal rate, regular  rhythm, normal heart sounds and intact distal pulses.  Exam reveals no gallop and no friction rub.    No murmur heard.  Pulmonary/Chest: Effort normal and  breath sounds normal. No stridor. No respiratory distress. She has no wheezes. She has no rales.   Abdominal: Soft. Bowel sounds are normal. She exhibits no distension and no mass. There is no tenderness.   Musculoskeletal: Normal range of motion. She exhibits tenderness. She exhibits no edema.   Lymphadenopathy:     She has no cervical adenopathy.   Neurological: She is alert and oriented to person, place, and time. She has normal reflexes. No cranial nerve deficit.   Skin: No rash noted. No erythema.   Psychiatric: She has a normal mood and affect. Her behavior is normal.       ASSESSMENT and PLAN  Allison Newman was seen today for follow-up.    Diagnoses and associated orders for this visit:    HTN, goal below 130/80, stable controlled on repeat  - CBC W/O DIFF  - METABOLIC PANEL, COMPREHENSIVE  - TSH, 3RD GENERATION  - LIPID PANEL  - HEMOGLOBIN A1C  - MICROALBUMIN:CREATININE RATIO, RANDOM URINE  - AMB POC URINALYSIS DIP STICK AUTO W/O MICRO  Cont with low salt k rich diet and increase PA  Diabetes mellitus type II, uncontrolled  - CBC W/O DIFF  - METABOLIC PANEL, COMPREHENSIVE  - TSH, 3RD GENERATION  - LIPID PANEL  - HEMOGLOBIN A1C  - MICROALBUMIN:CREATININE RATIO, RANDOM URINE  - AMB POC URINALYSIS DIP STICK AUTO W/O MICRO  - gabapentin (NEURONTIN) 100 mg capsule; Take 1 Cap by mouth three (3) times daily. Low cho diet await the A1c for further care    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, adult  - CBC W/O DIFF  - METABOLIC PANEL, COMPREHENSIVE  - TSH, 3RD GENERATION  - LIPID PANEL  - HEMOGLOBIN A1C  - MICROALBUMIN:CREATININE RATIO, RANDOM URINE  - AMB POC URINALYSIS DIP STICK AUTO W/O MICRO    Neuropathic arthritis  - CBC W/O DIFF  - METABOLIC PANEL, COMPREHENSIVE  - TSH, 3RD GENERATION  - LIPID PANEL  - HEMOGLOBIN A1C  - MICROALBUMIN:CREATININE RATIO, RANDOM URINE  - AMB POC URINALYSIS DIP STICK AUTO W/O MICRO  - gabapentin (NEURONTIN) 100 mg capsule; Take 1 Cap by mouth three (3) times daily.    Hypercholesterolemia   - CBC W/O DIFF  - METABOLIC PANEL, COMPREHENSIVE  - TSH, 3RD GENERATION  - LIPID PANEL  - HEMOGLOBIN A1C  - MICROALBUMIN:CREATININE RATIO, RANDOM URINE  - AMB POC URINALYSIS DIP STICK AUTO W/O MICRO    Pyuria  - CULTURE, URINE    lab results and schedule of future lab studies reviewed with patient  reviewed diet, exercise and weight control  very strongly urged to quit smoking to reduce cardiovascular risk  reviewed medications and side effects in detail  use of aspirin to prevent MI and TIA's discussed

## 2012-08-28 NOTE — Progress Notes (Signed)
Patient visit 3 month follow up

## 2012-08-29 LAB — CBC W/O DIFF
HCT: 39.6 % (ref 34.0–46.6)
HGB: 12.7 g/dL (ref 11.1–15.9)
MCH: 28.5 pg (ref 26.6–33.0)
MCHC: 32.1 g/dL (ref 31.5–35.7)
MCV: 89 fL (ref 79–97)
PLATELET: 182 10*3/uL (ref 150–379)
RBC: 4.45 x10E6/uL (ref 3.77–5.28)
RDW: 14.2 % (ref 12.3–15.4)
WBC: 5.4 10*3/uL (ref 3.4–10.8)

## 2012-08-29 LAB — LIPID PANEL
Cholesterol, total: 171 mg/dL (ref 100–199)
HDL Cholesterol: 62 mg/dL (ref >39)
LDL, calculated: 91 mg/dL (ref 0–99)
Triglyceride: 90 mg/dL (ref 0–149)
VLDL, calculated: 18 mg/dL (ref 5–40)

## 2012-08-29 LAB — METABOLIC PANEL, COMPREHENSIVE
A-G Ratio: 1.3 (ref 1.1–2.5)
ALT (SGPT): 16 IU/L (ref 0–32)
AST (SGOT): 15 IU/L (ref 0–40)
Albumin: 3.9 g/dL (ref 3.5–4.8)
Alk. phosphatase: 60 IU/L (ref 39–117)
BUN/Creatinine ratio: 25 (ref 11–26)
BUN: 22 mg/dL (ref 8–27)
Bilirubin, total: 0.3 mg/dL (ref 0.0–1.2)
CO2: 23 mmol/L (ref 18–29)
Calcium: 9.3 mg/dL (ref 8.6–10.2)
Chloride: 106 mmol/L (ref 97–108)
Creatinine: 0.88 mg/dL (ref 0.57–1.00)
GFR est AA: 77 mL/min/{1.73_m2} (ref >59)
GFR est non-AA: 67 mL/min/{1.73_m2} (ref >59)
GLOBULIN, TOTAL: 3 g/dL (ref 1.5–4.5)
Glucose: 135 mg/dL — ABNORMAL HIGH (ref 65–99)
Potassium: 4.4 mmol/L (ref 3.5–5.2)
Protein, total: 6.9 g/dL (ref 6.0–8.5)
Sodium: 143 mmol/L (ref 134–144)

## 2012-08-29 LAB — MICROALBUMIN:CREATININE RATIO, RANDOM URINE
Creatinine, urine random: 178.5 mg/dL (ref 15.0–328.0)
Microalb/Creat ratio (ug/mg creat.): 3.4 mg/g creat (ref 0.0–30.0)
Microalbumin, urine: 6 ug/mL (ref 0.0–17.0)

## 2012-08-29 LAB — TSH 3RD GENERATION: TSH: 0.397 u[IU]/mL — ABNORMAL LOW (ref 0.450–4.500)

## 2012-08-29 LAB — HEMOGLOBIN A1C WITH EAG: Hemoglobin A1c: 6.5 % — ABNORMAL HIGH (ref 4.8–5.6)

## 2012-08-30 LAB — CULTURE, URINE

## 2012-09-25 NOTE — Progress Notes (Signed)
Patient visit follow up DM.

## 2012-09-25 NOTE — Progress Notes (Signed)
HISTORY OF PRESENT ILLNESS  Allison Newman is a 72 y.o. female.  HPI    DMtype II    Compliant w/ meds, having the diabetic diet, and daily exercise, obtains home glucose monitoring averaging 110-120, checks it once daily . No Rf needed for today. Denies any tingling sensation, polyurea and polydipsia, last a1c was 6.5%, Last podiatry visit   And last eye exam was  2014    Last urine microalbumin 2014 . Feeling better since the last visit.    Current Outpatient Prescriptions   Medication Sig Dispense Refill   ??? triamterene-hydrochlorothiazide (DYAZIDE) 37.5-25 mg per capsule take 1 capsule by mouth once daily  90 Cap  3   ??? gabapentin (NEURONTIN) 100 mg capsule Take 1 Cap by mouth three (3) times daily.  90 Cap  1   ??? diclofenac EC (VOLTAREN) 75 mg EC tablet take 1 tablet by mouth twice a day  20 Tab  3   ??? fluticasone (FLONASE) 50 mcg/actuation nasal spray 2 sprays each nostril BID x 5 days; then once daily PRN  1 Bottle  4   ??? simethicone 125 mg chewable tablet Take 125 mg by mouth every six (6) hours as needed (gas).  60 Tab  5   ??? loratadine (CLARITIN) 10 mg tablet Take 1 Tab by mouth daily.  30 Tab  5   ??? sitaGLIPtin-metFORMIN (JANUMET) 50-1,000 mg per tablet take 1 tablet by mouth twice a day with food  60 Tab  3   ??? simvastatin (ZOCOR) 40 mg tablet Take 0.5 Tabs by mouth nightly.  30 Tab  6   ??? triamterene-hydrochlorothiazide (DYAZIDE) 37.5-25 mg per capsule Take 1 Cap by mouth daily.  30 Cap  3   ??? amLODIPine (NORVASC) 10 mg tablet Take 1 Tab by mouth daily.  30 Tab  7   ??? metoprolol (LOPRESSOR) 50 mg tablet Take 1 Tab by mouth two (2) times a day.  60 Tab  7   ??? ipratropium-albuterol (COMBIVENT RESPIMAT) 20-100 mcg/actuation inhaler Take 1 Puff by inhalation every six (6) hours.  1 Inhaler  6   ??? glucose blood VI test strips (FREESTYLE LITE STRIPS) strip Test twice daily.  1 Package  11   ??? Omeprazole delayed release (PRILOSEC D/R) 20 mg tablet Take 1 Tab by mouth two (2) times a day. 30 min before meal  twice daily  60 Tab  6   ??? aspirin 81 mg tablet Take 81 mg by mouth.         No Known Allergies  Past Medical History   Diagnosis Date   ??? Hypertension    ??? Diabetes    ??? Arthritis    ??? Gastrointestinal disorder      GERD   ??? 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, adult 08/28/2011   ??? 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 Elevated BUN 09/13/2011     Past Surgical History   Procedure Laterality Date   ??? Hx orthopaedic       knee replacement     No family history on file.  History   Substance Use Topics   ??? Smoking status: Never Smoker    ??? Smokeless tobacco: Not on file   ??? Alcohol Use: No        Component Value Date/Time   WBC 5.4 08/28/2012 10:56 AM   HGB 12.7 08/28/2012 10:56 AM   HCT 39.6 08/28/2012 10:56 AM   PLATELET 182 08/28/2012  10:56 AM   MCV 89 08/28/2012 10:56 AM       Component Value Date/Time   Hemoglobin A1c 6.5 08/28/2012 10:56 AM   Hemoglobin A1c 6.4 05/15/2012  9:02 AM   Hemoglobin A1c 6.2 01/01/2012  8:45 AM   Glucose 135 08/28/2012 10:56 AM   Glucose (POC) 143 01/03/2008  9:21 AM   Microalb/Creat ratio (ug/mg creat.) 3.4 08/28/2012 10:57 AM   LDL, calculated 91 08/28/2012 10:56 AM   Creatinine 0.88 08/28/2012 10:56 AM        Component Value Date/Time   Cholesterol, total 171 08/28/2012 10:56 AM   HDL Cholesterol 62 08/28/2012 10:56 AM   LDL, calculated 91 08/28/2012 10:56 AM   Triglyceride 90 08/28/2012 10:56 AM       Component Value Date/Time   ALT 16 08/28/2012 10:56 AM   AST 15 08/28/2012 10:56 AM   Alk. phosphatase 60 08/28/2012 10:56 AM   Bilirubin, total 0.3 08/28/2012 10:56 AM       Component Value Date/Time   GFR est AA 77 08/28/2012 10:56 AM   GFR est non-AA 67 08/28/2012 10:56 AM   Creatinine 0.88 08/28/2012 10:56 AM   BUN 22 08/28/2012 10:56 AM   Sodium 143 08/28/2012 10:56 AM   Potassium 4.4 08/28/2012 10:56 AM   Chloride 106 08/28/2012 10:56 AM   CO2 23 08/28/2012 10:56 AM        Component Value Date/Time   TSH 0.397 08/28/2012 10:56 AM   T4 8.9 06/27/2012  9:33 AM         Review of  Systems   Constitutional: Negative for fever and chills.   HENT: Negative for ear pain and nosebleeds.    Eyes: Negative for blurred vision, pain and discharge.   Respiratory: Negative for shortness of breath.    Cardiovascular: Negative for chest pain and leg swelling.   Gastrointestinal: Negative for nausea, vomiting, diarrhea and constipation.   Genitourinary: Negative for frequency.   Musculoskeletal: Negative for joint pain.   Skin: Negative for itching and rash.   Neurological: Negative for headaches.   Psychiatric/Behavioral: Negative for depression. The patient is not nervous/anxious.        Physical Exam   Nursing note and vitals reviewed.  Constitutional: She is oriented to person, place, and time. She appears well-developed and well-nourished.   HENT:   Head: Normocephalic and atraumatic.   Eyes: Conjunctivae and EOM are normal.   Neck: Normal range of motion. Neck supple.   Cardiovascular: Normal rate, regular rhythm and normal heart sounds.    No murmur heard.  Pulmonary/Chest: Effort normal and breath sounds normal.   Abdominal: Soft. Bowel sounds are normal. She exhibits no distension.   Musculoskeletal: Normal range of motion. She exhibits no edema.   Lymphadenopathy:     She has no cervical adenopathy.   Neurological: She is alert and oriented to person, place, and time.   Skin: No erythema.   Psychiatric: Her behavior is normal.       ASSESSMENT and PLAN  Allison Newman was seen today for follow-up.    Diagnoses and associated orders for this visit:    Diabetes mellitus type II, now is controlled  I have recommended the following steps for improving diabetic care and outcome to the patient: referral to Diabetic Education department, diabetic diet , low cholesterol diet, weight control and daily exercise discussed, home glucose monitoring emphasized, all medications, side effects and compliance discussed carefully, foot care discussed and  annual eye examinations at Ophthalmology discussed,  glycohemoglobin  and other lab monitoring discussed, long term diabetic complications discussed Cont w/ home monitoring before breakfast and sleep time,  will cont with a1c check q100m, flp in q24m, ua q visit, urine microablbumin q23m.  Rash, skin  - triamcinolone acetonide (KENALOG) 0.1 % topical cream; Apply  to affected area two (2) times a day. use thin layer      Lung nodule seen on imaging study  Need to repeat CT of the chest for 3.73mm of lung nodule  Intrathoracic goiter  - REFERRAL TO ENDOCRINOLOGY    Low TSH level  - REFERRAL TO ENDOCRINOLOGY  lab results and schedule of future lab studies reviewed with patient  reviewed diet, exercise and weight control  very strongly urged to quit smoking to reduce cardiovascular risk  cardiovascular risk and specific lipid/LDL goals reviewed  reviewed medications and side effects in detail

## 2012-09-25 NOTE — Patient Instructions (Signed)
Learning About Diabetes Food Guidelines  Your Care Instructions  Meal planning is important to manage diabetes. It helps keep your blood sugar at a target level (which you set with your doctor). You don't have to eat special foods. You can eat what your family eats, including sweets once in a while. But you do have to pay attention to how often you eat and how much you eat of certain foods.  You may want to work with a dietitian or a certified diabetes educator (CDE) to help you plan meals and snacks. A dietitian or CDE can also help you lose weight if that is one of your goals.  What should you know about eating carbs?  Managing the amount of carbohydrate (carbs) you eat is an important part of healthy meals when you have diabetes. Carbohydrate is found in many foods.  ?? Learn which foods have carbs. And learn the amounts of carbs in different foods.  ?? Bread, cereal, pasta, and rice have about 15 grams of carbs in a serving. A serving is 1 slice of bread (1 ounce), ?? cup of cooked cereal, or 1/3 cup of cooked pasta or rice.  ?? Fruits have 15 grams of carbs in a serving. A serving is 1 small fresh fruit, such as an apple or orange; ?? of a banana; ?? cup of cooked or canned fruit; ?? cup of fruit juice; 1 cup of melon or raspberries; or 2 tablespoons of dried fruit.  ?? Milk and no-sugar-added yogurt have 15 grams of carbs in a serving. A serving is 1 cup of milk or 2/3 cup of no-sugar-added yogurt.  ?? Starchy vegetables have 15 grams of carbs in a serving. A serving is ?? cup of mashed potatoes or sweet potato; 1 cup winter squash; ?? of a small baked potato; 1/4 cup of cooked dried beans; or ?? cup cooked corn or green peas.  ?? Learn how much carbs to eat each day and at each meal. A dietitian or CDE can teach you how to keep track of the amount of carbs you eat. This is called carbohydrate counting.  ?? If you are not sure how to count carbohydrate grams, use the Plate Method to plan meals. It is a good, quick way to  make sure that you have a balanced meal. It also helps you spread carbs throughout the day.  ?? Divide your plate by types of foods. Put non-starchy vegetables on half the plate, meat or other protein food on one-quarter of the plate, and a grain or starchy vegetable in the final quarter of the plate. To this you can add a small piece of fruit and 1 cup of milk or yogurt, depending on how many carbs you are supposed to eat at a meal.  ?? Try to eat about the same amount of carbs at each meal. Do not "save up" your daily allowance of carbs to eat at one meal.  ?? Proteins have very little or no carbs per serving. Examples of proteins are beef, chicken, turkey, fish, eggs, tofu, cheese, cottage cheese, and peanut butter. A serving size of meat is 3 ounces, which is about the size of a deck of cards. Examples of meat substitute serving sizes (equal to 1 ounce of meat) are 1/4 cup of cottage cheese, 1 egg, 1 tablespoon of peanut butter, and ?? cup of tofu.  How can you eat out and still eat healthy?  ?? Learn to estimate the serving sizes of foods that   have carbohydrate. If you measure food at home, it will be easier to estimate the amount in a serving of restaurant food.  ?? If the meal you order has too much carbohydrate (such as potatoes, corn, or baked beans), ask to have a low-carbohydrate food instead. Ask for a salad or green vegetables.  ?? If you use insulin, check your blood sugar before and after eating out to help you plan how much to eat in the future.  ?? If you eat more carbohydrate at a meal than you had planned, take a walk or do other exercise. This will help lower your blood sugar.  What else should you know?  ?? Limit saturated fat, such as the fat from meat and dairy products. This is a healthy choice because people who have diabetes are at higher risk of heart disease. So choose lean cuts of meat and nonfat or low-fat dairy products. Use olive or canola oil instead of butter or shortening when cooking.  ??  Don't skip meals. Your blood sugar may drop too low if you skip meals and take certain diabetes pills or insulin.  ?? Check with your doctor before you drink alcohol. Alcohol can cause your blood sugar to drop too low. Alcohol can also cause a bad reaction if you take certain diabetes pills.  Follow-up care is a key part of your treatment and safety. Be sure to make and go to all appointments, and call your doctor if you are having problems. It's also a good idea to know your test results and keep a list of the medicines you take.   Where can you learn more?   Go to http://www.healthwise.net/[facility]  Enter I147 in the search box to learn more about "Learning About Diabetes Food Guidelines."   ?? 2006-2014 Healthwise, Incorporated. Care instructions adapted under license by [place] (which disclaims liability or warranty for this information). This care instruction is for use with your licensed healthcare professional. If you have questions about a medical condition or this instruction, always ask your healthcare professional. Healthwise, Incorporated disclaims any warranty or liability for your use of this information.  Content Version: 10.1.311062; Current as of: March 08, 2012              Learning About Diabetes and Heart Disease  How are diabetes and heart disease connected?  Many people think diabetes and heart disease go hand in hand. But having diabetes doesn't have to mean that you are going to have a heart attack someday. Healthy living can help prevent many of the problems that come with both diabetes and heart disease.  For some people, diabetes can cause problems in your body that may lead to heart disease. Diabetes can make the problems of heart disease worse.  But here's the good news: The good things you're doing to stay healthy with diabetes???eating healthy foods, quitting smoking, getting exercise and more???are also helping your heart.  How can diabetes lead to heart disease?  The same things  that make diabetes a serious condition can also lead to heart disease or make it worse.  ?? High cholesterol causes the buildup of a kind of fat inside the blood vessel walls, making them too narrow. This reduces the flow of blood and can cause a heart attack.  ?? High blood pressure pushes blood through the arteries with too much force. Over time, this damages the walls of the arteries.  ?? High blood sugar can damage the lining of blood vessels. This   can lead to the hardening and narrowing of the arteries, resulting in less blood flow to the heart.  How can you manage heart disease with your diabetes?  Managing your diabetes and keeping your heart healthy are two sides of the same coin. Here are some things you can do.  ?? Test your blood sugar levels and get your diabetes tests on schedule. Try to keep your numbers within your target range.  ?? Keep track of your blood pressure. The target for most people with diabetes is below 140/80. Your doctor will give you a goal that's right for you. If your blood pressure is high, your treatment may also include medicine. Changes in your lifestyle, such as staying at a healthy weight, may also help you lower your blood pressure.  ?? If you have high cholesterol, try controlling it by eating more fruits and vegetables and less animal fat and trans fats, like those found in packaged snack foods. Getting regular exercise can really help. If changes in diet and exercise don't improve your cholesterol levels, talk to your doctor about using medicine.  ?? Ask your doctor if you should take low-dose aspirin. A low-dose aspirin every day can prevent blood clots from forming in your arteries. A blood clot in an artery in your heart can cause a heart attack.  ?? If your doctor recommends it, get more exercise. Walking is a good choice. Bit by bit, increase the amount you walk every day. Try for at least 30 minutes on most days of the week.  ?? Do not smoke. Smoking can make diabetes and  heart disease worse. If you need help quitting, talk to your doctor about stop-smoking programs and medicines. These can increase your chances of quitting for good.   Where can you learn more?   Go to http://www.healthwise.net/[facility]  Enter K649 in the search box to learn more about "Learning About Diabetes and Heart Disease."   ?? 2006-2014 Healthwise, Incorporated. Care instructions adapted under license by [place] (which disclaims liability or warranty for this information). This care instruction is for use with your licensed healthcare professional. If you have questions about a medical condition or this instruction, always ask your healthcare professional. Healthwise, Incorporated disclaims any warranty or liability for your use of this information.  Content Version: 10.1.311062; Current as of: October 02, 2011

## 2012-09-30 NOTE — Telephone Encounter (Signed)
Patient has not heard from       Best number to call

## 2012-10-22 NOTE — Progress Notes (Signed)
Quick Note:    I have received and reviewed results and have analyzed them in a patient-specific historical context as possible and/or medically indicated. Patient notified of results.  ______

## 2012-10-28 ENCOUNTER — Encounter

## 2012-10-31 ENCOUNTER — Other Ambulatory Visit: Payer: Self-pay | Admitting: Internal Medicine

## 2012-10-31 ENCOUNTER — Ambulatory Visit
Admission: RE | Admit: 2012-10-31 | Discharge: 2012-10-31 | Disposition: A | Discharge status: Home / Self Care | Payer: Medicare Other | Source: Ambulatory Visit | Attending: Internal Medicine | Admitting: Internal Medicine

## 2012-10-31 DIAGNOSIS — R05 Cough: Secondary | ICD-10-CM

## 2012-10-31 DIAGNOSIS — R059 Cough, unspecified: Secondary | ICD-10-CM

## 2012-11-05 ENCOUNTER — Other Ambulatory Visit: Payer: Self-pay | Admitting: Internal Medicine

## 2012-11-05 DIAGNOSIS — E049 Nontoxic goiter, unspecified: Secondary | ICD-10-CM

## 2012-11-08 ENCOUNTER — Ambulatory Visit
Admission: RE | Admit: 2012-11-08 | Discharge: 2012-11-08 | Disposition: A | Discharge status: Home / Self Care | Payer: 59 | Source: Ambulatory Visit | Attending: Internal Medicine | Admitting: Internal Medicine

## 2012-11-08 DIAGNOSIS — E049 Nontoxic goiter, unspecified: Secondary | ICD-10-CM

## 2012-11-12 ENCOUNTER — Other Ambulatory Visit: Payer: Self-pay | Admitting: Internal Medicine

## 2012-11-12 DIAGNOSIS — E041 Nontoxic single thyroid nodule: Secondary | ICD-10-CM

## 2012-11-19 ENCOUNTER — Other Ambulatory Visit (HOSPITAL_COMMUNITY)
Admission: RE | Admit: 2012-11-19 | Discharge: 2012-11-19 | Disposition: A | Discharge status: Home / Self Care | Payer: Medicare Other | Source: Ambulatory Visit | Attending: Interventional Radiology | Admitting: Interventional Radiology

## 2012-11-19 ENCOUNTER — Ambulatory Visit
Admission: RE | Admit: 2012-11-19 | Discharge: 2012-11-19 | Disposition: A | Discharge status: Home / Self Care | Payer: 59 | Source: Ambulatory Visit | Attending: Internal Medicine | Admitting: Internal Medicine

## 2012-11-19 ENCOUNTER — Ambulatory Visit
Admission: RE | Admit: 2012-11-19 | Discharge: 2012-11-19 | Disposition: A | Discharge status: Home / Self Care | Payer: Medicare Other | Source: Ambulatory Visit | Attending: Internal Medicine | Admitting: Internal Medicine

## 2012-11-19 DIAGNOSIS — E041 Nontoxic single thyroid nodule: Secondary | ICD-10-CM | POA: Insufficient documentation

## 2012-12-04 LAB — CBC WITH AUTOMATED DIFF
ABS. BASOPHILS: 0 10*3/uL (ref 0.0–0.1)
ABS. EOSINOPHILS: 0.2 10*3/uL (ref 0.0–0.4)
ABS. LYMPHOCYTES: 1.5 10*3/uL (ref 0.8–3.5)
ABS. MONOCYTES: 0.5 10*3/uL (ref 0.0–1.0)
ABS. NEUTROPHILS: 3.8 10*3/uL (ref 1.8–8.0)
BASOPHILS: 0 % (ref 0–1)
EOSINOPHILS: 4 % (ref 0–7)
HCT: 36.8 % (ref 35.0–47.0)
HGB: 12.1 g/dL (ref 11.5–16.0)
LYMPHOCYTES: 25 % (ref 12–49)
MCH: 28.9 PG (ref 26.0–34.0)
MCHC: 32.9 g/dL (ref 30.0–36.5)
MCV: 88 FL (ref 80.0–99.0)
MONOCYTES: 9 % (ref 5–13)
NEUTROPHILS: 62 % (ref 32–75)
PLATELET: 140 10*3/uL — ABNORMAL LOW (ref 150–400)
RBC: 4.18 M/uL (ref 3.80–5.20)
RDW: 14.7 % — ABNORMAL HIGH (ref 11.5–14.5)
WBC: 6.2 10*3/uL (ref 3.6–11.0)

## 2012-12-04 LAB — HEPATIC FUNCTION PANEL
A-G Ratio: 0.8 — ABNORMAL LOW (ref 1.1–2.2)
ALT (SGPT): 25 U/L (ref 12–78)
AST (SGOT): 16 U/L (ref 15–37)
Albumin: 3.1 g/dL — ABNORMAL LOW (ref 3.5–5.0)
Alk. phosphatase: 63 U/L (ref 45–117)
Bilirubin, direct: 0.1 MG/DL (ref 0.0–0.2)
Bilirubin, total: 0.3 MG/DL (ref 0.2–1.0)
Globulin: 3.9 g/dL (ref 2.0–4.0)
Protein, total: 7 g/dL (ref 6.4–8.2)

## 2012-12-04 LAB — URINALYSIS W/ REFLEX CULTURE
Bilirubin: NEGATIVE
Blood: NEGATIVE
Glucose: NEGATIVE mg/dL
Ketone: NEGATIVE mg/dL
Nitrites: NEGATIVE
Protein: NEGATIVE mg/dL
Specific gravity: 1.02 (ref 1.003–1.030)
Urobilinogen: 0.2 EU/dL (ref 0.2–1.0)
pH (UA): 5.5 (ref 5.0–8.0)

## 2012-12-04 LAB — METABOLIC PANEL, COMPREHENSIVE
A-G Ratio: 0.8 — ABNORMAL LOW (ref 1.1–2.2)
ALT (SGPT): 20 U/L (ref 12–78)
AST (SGOT): 18 U/L (ref 15–37)
Albumin: 3 g/dL — ABNORMAL LOW (ref 3.5–5.0)
Alk. phosphatase: 64 U/L (ref 45–117)
Anion gap: 12 mmol/L (ref 5–15)
BUN/Creatinine ratio: 26 — ABNORMAL HIGH (ref 12–20)
BUN: 26 MG/DL — ABNORMAL HIGH (ref 6–20)
Bilirubin, total: 0.2 MG/DL (ref 0.2–1.0)
CO2: 27 mmol/L (ref 21–32)
Calcium: 8.8 MG/DL (ref 8.5–10.1)
Chloride: 108 mmol/L (ref 97–108)
Creatinine: 1 MG/DL (ref 0.45–1.15)
GFR est AA: >60 mL/min/{1.73_m2} (ref >60)
GFR est non-AA: 55 mL/min/{1.73_m2} — ABNORMAL LOW (ref >60)
Globulin: 4 g/dL (ref 2.0–4.0)
Glucose: 166 mg/dL — ABNORMAL HIGH (ref 65–100)
Potassium: 4.2 mmol/L (ref 3.5–5.1)
Protein, total: 7 g/dL (ref 6.4–8.2)
Sodium: 147 mmol/L — ABNORMAL HIGH (ref 136–145)

## 2012-12-04 NOTE — ED Notes (Signed)
Patient (s) was given copy of dc instructions and two script(s).  Patient (s) has verbalized understanding of instructions and script (s).  Patient given a current medication reconciliation form and verbalized understanding of their medications.   Patient (s) has verbalized understanding of the importance of discussing medications with  his or her physician or clinic they will be following up with.  Patient alert and oriented and in no acute distress.  Patient discharged home ambulatory with self.

## 2012-12-04 NOTE — ED Notes (Signed)
22 G IV removed from pt's R AC.

## 2012-12-04 NOTE — ED Notes (Addendum)
Agree with nursing assessment completed by Kristy Henry, RN

## 2012-12-04 NOTE — ED Provider Notes (Signed)
HPI Comments: 72 yo female h/o DM, HTN c/o right flank pain x 1 week. Reports 8-10/10 pain getting worse this a.m. Noted she could barely get up to the bathroom this a.m d/t pain. No fever, congestion, SOB, chest pain, back pain, urinary sx.     States she has sciatica that acts up from time to time, but no leg pain. States she has done nothing for the pain - she doesn't take pain medicine. But asked what I would give her since "that hydrocodone doesn't do anything for me".    Patient is a 72 y.o. female presenting with abdominal pain.   Abdominal Pain   Pertinent negatives include no fever, no diarrhea, no nausea, no vomiting, no dysuria, no frequency, no hematuria, no headaches, no arthralgias, no myalgias and no chest pain.        Past Medical History   Diagnosis Date   ??? Hypertension    ??? Diabetes    ??? Arthritis    ??? Gastrointestinal disorder      GERD   ??? 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, adult 08/28/2011   ??? 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 Elevated BUN 09/13/2011   ??? Rash, skin 09/25/2012   ??? 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 Hx orthopaedic       knee replacement         History reviewed. No pertinent family history.     History     Social History   ??? Marital Status: SINGLE     Spouse Name: N/A     Number of Children: N/A   ??? Years of Education: N/A     Occupational History   ??? Not on file.     Social History Main Topics   ??? Smoking status: Never Smoker    ??? Smokeless tobacco: Not on file   ??? Alcohol Use: No   ??? Drug Use: No   ??? Sexually Active:      Other Topics Concern   ??? Not on file     Social History Narrative   ??? No narrative on file                  ALLERGIES: Review of patient's allergies indicates no known allergies.      Review of Systems   Constitutional: Negative for fever, chills, appetite change and fatigue.   HENT: Negative for congestion, sore throat and sinus pressure.    Eyes: Negative for  visual disturbance.   Respiratory: Negative for cough, shortness of breath and wheezing.    Cardiovascular: Negative for chest pain and palpitations.   Gastrointestinal: Positive for abdominal pain. Negative for nausea, vomiting and diarrhea.   Genitourinary: Negative for dysuria, frequency and hematuria.   Musculoskeletal: Negative for myalgias and arthralgias.   Skin: Negative for rash.   Neurological: Negative for dizziness, weakness, light-headedness and headaches.   All other systems reviewed and are negative.        Filed Vitals:    12/04/12 1615   BP: 190/87   Pulse: 77   Temp: 98.3 ??F (36.8 ??C)   Resp: 18   Height: 5\' 4"  (1.626 m)   Weight: 156.491 kg (345 lb)   SpO2: 99%            Physical Exam   Nursing note and vitals reviewed.  Constitutional: She is oriented to  person, place, and time. She appears well-developed and well-nourished. No distress.   Morbidly obese    HENT:   Head: Normocephalic and atraumatic.   Right Ear: External ear normal.   Left Ear: External ear normal.   Eyes: Conjunctivae and EOM are normal. Pupils are equal, round, and reactive to light. Right eye exhibits no discharge. Left eye exhibits no discharge.   Neck: Normal range of motion. Neck supple.   Cardiovascular: Normal rate, regular rhythm and normal heart sounds.  Exam reveals no gallop and no friction rub.    No murmur heard.  Pulmonary/Chest: Effort normal and breath sounds normal. She has no wheezes. She has no rales.   Abdominal: Soft. Bowel sounds are normal. She exhibits no distension. There is tenderness in the right upper quadrant and right lower quadrant. There is CVA tenderness. There is no rebound and no guarding.   Musculoskeletal: Normal range of motion. She exhibits no edema and no tenderness.   Lymphadenopathy:     She has no cervical adenopathy.   Neurological: She is alert and oriented to person, place, and time. No cranial nerve deficit.   Skin: Skin is warm and dry.   Psychiatric: She has a normal mood and  affect. Her behavior is normal. Judgment normal.        MDM     Differential Diagnosis; Clinical Impression; Plan:     DDX: UTI, kidney stone, sciatica  Amount and/or Complexity of Data Reviewed:   Clinical lab tests:  Ordered and reviewed   Review and summarize past medical records:  Yes   Discuss the patient with another provider:  Yes      Procedures    PLAN    -- HOME with keflex, tramadol    I have discussed with patient their results, diagnosis, treatment, and follow up plan. The patient agrees to follow up as outlined in discharge paperwork and also to return to the ED with any worsening. Ciro Backer, PA-C

## 2012-12-04 NOTE — ED Notes (Signed)
Patient updated on plan of care.  Patient is resting comfortably in bed.

## 2012-12-04 NOTE — ED Provider Notes (Signed)
I was personally available for consult in the emergency department. I have reviewed the chart and agree with the documentation recorded by the MLP, including the assessment, treatment plan, and disposition.

## 2012-12-04 NOTE — ED Notes (Signed)
Pt sitting on bed awaiting results, pt states that she is ready to go home.

## 2012-12-04 NOTE — ED Notes (Signed)
Bedside and Verbal shift change report given to NOEL M TOWNES, RN (oncoming nurse) by K Henry, RN (offgoing nurse). Report included the following information SBAR, Kardex and ED Summary.

## 2012-12-04 NOTE — ED Notes (Signed)
Assumed care of patient.  Patient ambulatory to room from triage.  Patient presents to the ED with c/o of abdominal pain on right side x 7 days.  Patient reports this morning it was "unbearable".  Patient states it comes and goes.  Patient denies N/V/D.  Denies SOB or fevers.  Patient denies chest pain. Patient skin warm, dry and intact.  Patient breathing regular and unlabored.  Patient pain level 6/10 but was 10/10 at most intense moment.  Patient call bell within reach.  Bed placed in comfortable position.  Patient in no apparent distress.  Will continue to monitor patient.

## 2012-12-05 NOTE — Progress Notes (Signed)
Patent was discharged on 12/04/12 from  [facility]. Attempted to contact patient. Will send [facility] letter.    Susa Simmonds RN

## 2012-12-06 LAB — CULTURE, URINE
Colonies Counted: 50000
Colony Count: 50000

## 2013-01-03 ENCOUNTER — Telehealth: Payer: Self-pay | Admitting: Critical Care Medicine

## 2013-01-03 ENCOUNTER — Encounter: Payer: Self-pay | Admitting: Internal Medicine

## 2013-01-03 ENCOUNTER — Other Ambulatory Visit: Payer: Self-pay | Admitting: *Deleted

## 2013-01-03 ENCOUNTER — Ambulatory Visit (INDEPENDENT_AMBULATORY_CARE_PROVIDER_SITE_OTHER): Payer: Medicare Other | Admitting: Internal Medicine

## 2013-01-03 VITALS — BP 122/84 | HR 67 | Temp 98.0°F | Ht 68.0 in | Wt 219.4 lb

## 2013-01-03 DIAGNOSIS — R05 Cough: Secondary | ICD-10-CM

## 2013-01-03 DIAGNOSIS — R053 Chronic cough: Secondary | ICD-10-CM

## 2013-01-03 DIAGNOSIS — R059 Cough, unspecified: Secondary | ICD-10-CM

## 2013-01-03 MED ORDER — PANTOPRAZOLE SODIUM 40 MG PO TBEC: 40.0000 mg | DELAYED_RELEASE_TABLET | Freq: Every day | ORAL | Status: DC

## 2013-01-03 NOTE — Patient Instructions (Signed)
Pantoprazole (protonix) 40 mg   Take 30-60 min before first meal of the day and Pepcid 20 mg one bedtime plus Chlortrimeton 4 mg until return to office - this is the best way to tell whether stomach acid is contributing to your problem.    GERD (REFLUX)  is an extremely common cause of respiratory symptoms, many times with no significant heartburn at all.    It can be treated with medication, but also with lifestyle changes including avoidance of late meals, excessive alcohol, smoking cessation, and avoid fatty foods, chocolate, peppermint, colas, red wine, and acidic juices such as orange juice.  NO MINT OR MENTHOL PRODUCTS SO NO COUGH DROPS  USE SUGARLESS CANDY INSTEAD (jolley ranchers or Stover's)  NO OIL BASED VITAMINS - use powdered substitutes.    Please schedule a follow up office visit in 6 weeks, call sooner if needed

## 2013-01-03 NOTE — Progress Notes (Signed)
Subjective:    Patient ID: Margaret Gordon, female    DOB: [birth year]    MRN: [id number]  HPI  58 yobf never regular smoker retired from [employer] with new onset cough waxes and wanes x 2010 referred by Dr Nehemiah Settle for eval of cough and abn cxr referred  01/03/2013   To pulmonary clinic.   01/03/2013 1st  Pulmonary office visit/ Wert cc chronic cough x 4 years can get better for up to 1 month at a time and zyrtec x one year used each am  With no impact on the cough.  Just finished round of tussionex which usually works and better and off  tussionex x one week s relapse.   Cough is dry and worse at hs.   No other obvious pattern day to day or daytime variabilty or assoc sob or cp or chest tightness, subjective wheeze overt sinus or hb symptoms. No unusual exp hx or h/o childhood pna/ asthma or knowledge of premature birth.  Sleeping ok without nocturnal  or early am exacerbation  of respiratory  c/o's or need for noct saba. Also denies any obvious fluctuation of symptoms with weather or environmental changes or other aggravating or alleviating factors except as outlined above   Current Medications, Allergies, Complete Past Medical History, Past Surgical History, Family History, and Social History were reviewed in [employer] record.  ROS  The following are not active complaints unless bolded sore throat, dysphagia, dental problems, itching, sneezing,  nasal congestion or excess/ purulent secretions, ear ache,   fever, chills, sweats, unintended wt loss, pleuritic or exertional cp, hemoptysis,  orthopnea pnd or leg swelling, presyncope, palpitations, heartburn, abdominal pain, anorexia, nausea, vomiting, diarrhea  or change in bowel or urinary habits, change in stools or urine, dysuria,hematuria,  rash, arthralgias, visual complaints, headache, numbness weakness or ataxia or problems with walking or coordination,  change in mood/affect or memory.         Review of  Systems  Constitutional: Negative for fever, chills and unexpected weight change.  HENT: Negative for congestion, dental problem, ear pain, nosebleeds, postnasal drip, rhinorrhea, sinus pressure, sneezing, sore throat, trouble swallowing and voice change.   Eyes: Negative for visual disturbance.  Respiratory: Positive for cough. Negative for choking and shortness of breath.   Cardiovascular: Negative for chest pain and leg swelling.  Gastrointestinal: Negative for vomiting, abdominal pain and diarrhea.  Genitourinary: Negative for difficulty urinating.  Musculoskeletal: Negative for arthralgias.  Skin: Negative for rash.  Neurological: Negative for tremors, syncope and headaches.  Hematological: Does not bruise/bleed easily.       Objective:   Physical Exam  amb bf nad  Wt Readings from Last 3 Encounters:  01/03/13 219 lb 6.4 oz (99.519 kg)     HEENT: nl dentition, turbinates, and orophanx. Nl external ear canals without cough reflex   NECK :  without JVD/Nodes/TM/ nl carotid upstrokes bilaterally   LUNGS: no acc muscle use, clear to A and P bilaterally without cough on insp or exp maneuvers   CV:  RRR  no s3 or murmur or increase in P2, no edema   ABD:  soft and nontender with nl excursion in the supine position. No bruits or organomegaly, bowel sounds nl  MS:  warm without deformities, calf tenderness, cyanosis or clubbing  SKIN: warm and dry without lesions    NEURO:  alert, approp, no deficits     11/01/09 Swallowing study  ssmall HH/ schatzki's and reflux  CT chest 10/31/12  1. Lingular and left lower lobe scarring / fibrosis. Likely  related to prior infection or inflammation. No explanation for  acute cough.  2. 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 Consider thyroid ultrasound.  3. Pulmonary artery enlargement suggests pulmonary arterial  hypertension.  4. Small hiatal hernia.     Assessment & Plan:

## 2013-01-03 NOTE — Telephone Encounter (Signed)
LMOM x 1 

## 2013-01-05 DIAGNOSIS — R05 Cough: Secondary | ICD-10-CM | POA: Insufficient documentation

## 2013-01-05 DIAGNOSIS — R058 Other specified cough: Secondary | ICD-10-CM | POA: Insufficient documentation

## 2013-01-05 NOTE — Assessment & Plan Note (Addendum)
The most common causes of chronic cough in immunocompetent adults include the following: upper airway cough syndrome (UACS), previously referred to as postnasal drip syndrome (PNDS), which is caused by variety of rhinosinus conditions; (2) asthma; (3) GERD; (4) chronic bronchitis from cigarette smoking or other inhaled environmental irritants; (5) nonasthmatic eosinophilic bronchitis; and (6) bronchiectasis.   These conditions, singly or in combination, have accounted for up to 94% of the causes of chronic cough in prospective studies.   Other conditions have constituted no >6% of the causes in prospective studies These have included bronchogenic carcinoma, chronic interstitial pneumonia, sarcoidosis, left ventricular failure, ACEI-induced cough, and aspiration from a condition associated with pharyngeal dysfunction.    Chronic cough is often simultaneously caused by more than one condition. A single cause has been found from 38 to 82% of the time, multiple causes from 18 to 62%. Multiply caused cough has been the result of three diseases up to 42% of the time.      Most likely this is  Classic Upper airway cough syndrome, so named because it's frequently impossible to sort out how much is  CR/sinusitis with freq throat clearing (which can be related to primary GERD)   vs  causing  secondary (" extra esophageal")  GERD from wide swings in gastric pressure that occur with throat clearing, often  promoting self use of mint and menthol lozenges that reduce the lower esophageal sphincter tone and exacerbate the problem further in a cyclical fashion.   These are the same pts (now being labeled as having "irritable larynx syndrome" by some cough centers) who not infrequently have a history of having failed to tolerate ace inhibitors,  dry powder inhalers or biphosphonates or report having atypical reflux symptoms that don't respond to standard doses of PPI , and are easily confused as having aecopd or asthma  flares by even experienced allergists/ pulmonologists.   For now rec max gerd rx plus hs 1st gen H1 per guidelines    Discussed with pt in detail:  The standardized cough guidelines published in Chest by [place] in 2006 are still the best available and consist of a multiple step process (up to 12!) , not a single office visit,  and are intended  to address this problem logically,  with an alogrithm dependent on response to empiric treatment at  each progressive step  to determine a specific diagnosis with  minimal addtional testing needed. Therefore if adherence is an issue or can't be accurately verified,  it's very unlikely the standard evaluation and treatment will be successful here.    Furthermore, response to therapy (other than acute cough suppression, which should only be used short term with avoidance of narcotic containing cough syrups if possible), can be a gradual process for which the patient may perceive immediate benefit.  Unlike going to an eye doctor where the best perscription is almost always the first one and is immediately effective, this is almost never the case in the management of chronic cough syndromes. Therefore the patient needs to commit up front to consistently adhere to recommendations  for up to 6 weeks of therapy directed at the likely underlying problem(s) before the response can be reasonably evaluated.

## 2013-01-06 NOTE — Telephone Encounter (Signed)
I spoke with this pt and she states she did not call about this, she does not use a DME company. I atc the number given in the message and it rings busy. I contacted the listed pt via her home number. I will leave this message open and try the number again to see if we can figure out who the correct pt is. Carron Curie, CMA

## 2013-01-07 NOTE — Telephone Encounter (Signed)
Error wrong patient

## 2013-01-08 NOTE — Progress Notes (Signed)
Contacted patient for [facility] Diabetes monitoring.Two patient identifiers used were DOB and address. BP needed to be monitored, patient reports having endocrinology appointment on 01/07/13 where she had a good BP. Diabetes education done with patient. Dr. Mariel Craft office called and results had been faxed. Results entered and office notes to be scanned. All other measures met. This encounter closed and episode resolved.

## 2013-01-13 ENCOUNTER — Encounter

## 2013-01-22 ENCOUNTER — Encounter

## 2013-02-03 ENCOUNTER — Encounter

## 2013-02-12 ENCOUNTER — Encounter

## 2013-02-14 ENCOUNTER — Ambulatory Visit (INDEPENDENT_AMBULATORY_CARE_PROVIDER_SITE_OTHER): Payer: Medicare Other | Admitting: Internal Medicine

## 2013-02-14 ENCOUNTER — Encounter: Payer: Self-pay | Admitting: Internal Medicine

## 2013-02-14 VITALS — BP 134/84 | HR 72 | Temp 98.7°F | Ht 67.5 in | Wt 219.0 lb

## 2013-02-14 DIAGNOSIS — R05 Cough: Secondary | ICD-10-CM

## 2013-02-14 DIAGNOSIS — R059 Cough, unspecified: Secondary | ICD-10-CM

## 2013-02-14 MED ORDER — PANTOPRAZOLE SODIUM 40 MG PO TBEC: 40.0000 mg | DELAYED_RELEASE_TABLET | Freq: Every day | ORAL | Status: DC

## 2013-02-14 NOTE — Patient Instructions (Addendum)
Pantoprazole (protonix) 40 mg   Take 30-60 min before first meal of the day and Pepcid 20 mg one bedtime plus Chlortrimeton 4 mg 1-2  at bedtime  until return to office - this is the best way to tell whether stomach acid is contributing to your problem.    GERD (REFLUX)  is an extremely common cause of respiratory symptoms, many times with no significant heartburn at all.    It can be treated with medication, but also with lifestyle changes including avoidance of late meals, excessive alcohol, smoking cessation, and avoid fatty foods, chocolate, peppermint, colas, red wine, and acidic juices such as orange juice.  NO MINT OR MENTHOL PRODUCTS SO NO COUGH DROPS  USE SUGARLESS CANDY INSTEAD (jolley ranchers or Stover's)  NO OIL BASED VITAMINS - use powdered substitutes.    Please schedule a follow up office visit in 6 weeks, call sooner if needed with cxr on return

## 2013-02-14 NOTE — Progress Notes (Signed)
Subjective:    Patient ID: Margaret Gordon, female    DOB: [birth year]    MRN: [id number]   Brief patient profile:  33 yobf never regular smoker retired from [employer] with new onset cough waxes and wanes x 2010 referred by Dr Nehemiah Settle for eval of cough and abn cxr referred  01/03/2013   To pulmonary clinic.   History of Present Illness  01/03/2013 1st [facility] office visit/ Wert cc chronic cough x 4 years can get better for up to 1 month at a time and zyrtec x one year used each am  With no impact on the cough. Just finished round of tussionex which usually works and better and off  tussionex x one week s relapse.  Cough is dry and worse at hs.  rec Pantoprazole (protonix) 40 mg   Take 30-60 min before first meal of the day and Pepcid 20 mg one bedtime plus Chlortrimeton 4 mg until return to office - this is the best way to tell whether stomach acid is contributing to your problem.   GERD diet     02/14/2013 f/u ov/Wert re: chronic cough 50% vx baseline x 4 y/ did not take chlortrimeton Chief Complaint  Patient presents with  . Follow-up    Pt states her cough has improved since the last visit.    continues dry and most notable at hs     Kouffman Reflux v Neurogenic Cough Differentiator Reflux Comments  Do you awaken from a sound sleep coughing violently?                            With trouble breathing? Less    Do you have choking episodes when you cannot  Get enough air, gasping for air ?              Yes, in past   Do you usually cough when you lie down into  The bed, or when you just lie down to rest ?                          Yes   Do you usually cough after meals or eating?         no   Do you cough when (or after) you bend over?    no   GERD SCORE     Kouffman Reflux v Neurogenic Cough Differentiator Neurogenic   Do you more-or-less cough all day long? no   Does change of temperature make you cough? no   Does laughing or chuckling cause you to cough? no   Do fumes  (perfume, automobile fumes, burned  Toast, etc.,) cause you to cough ?      no   Does speaking, singing, or talking on the phone cause you to cough   ?               no   Neurogenic/Airway score        No other obvious pattern day to day or daytime variabilty or assoc sob or cp or chest tightness, subjective wheeze overt sinus or hb symptoms. No unusual exp hx or h/o childhood pna/ asthma or knowledge of premature birth.  Sleeping ok without nocturnal  or early am exacerbation  of respiratory  c/o's or need for noct saba. Also denies any obvious fluctuation of symptoms with weather or environmental changes or other aggravating or alleviating factors except as  outlined above   Current Medications, Allergies, Complete Past Medical History, Past Surgical History, Family History, and Social History were reviewed in [employer] [facility] record.  ROS  The following are not active complaints unless bolded sore throat, dysphagia, dental problems, itching, sneezing,  nasal congestion or excess/ purulent secretions, ear ache,   fever, chills, sweats, unintended wt loss, pleuritic or exertional cp, hemoptysis,  orthopnea pnd or leg swelling, presyncope, palpitations, heartburn, abdominal pain, anorexia, nausea, vomiting, diarrhea  or change in bowel or urinary habits, change in stools or urine, dysuria,hematuria,  rash, arthralgias, visual complaints, headache, numbness weakness or ataxia or problems with walking or coordination,  change in mood/affect or memory.               Objective:   Physical Exam  amb bf nad  02/14/2013     219  Wt Readings from Last 3 Encounters:  01/03/13 219 lb 6.4 oz (99.519 kg)     HEENT: nl dentition, turbinates, and orophanx. Nl external ear canals without cough reflex   NECK :  without JVD/Nodes/TM/ nl carotid upstrokes bilaterally   LUNGS: no acc muscle use, clear to A and P bilaterally without cough on insp or exp maneuvers   CV:  RRR  no  s3 or murmur or increase in P2, no edema   ABD:  soft and nontender with nl excursion in the supine position. No bruits or organomegaly, bowel sounds nl  MS:  warm without deformities, calf tenderness, cyanosis or clubbing  SKIN: warm and dry without lesions    NEURO:  alert, approp, no deficits     11/01/09 Swallowing study  small HH/ schatzki's and reflux  CT chest 10/31/12 1. Lingular and left lower lobe scarring / fibrosis. Likely  related to prior infection or inflammation. No explanation for  acute cough.  2. 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 Consider thyroid ultrasound.  3. Pulmonary artery enlargement suggests pulmonary arterial  hypertension.  4. Small hiatal hernia.     Assessment & Plan:

## 2013-02-15 NOTE — Assessment & Plan Note (Addendum)
Lack of cough resolution could mean an alternative diagnosis, persistence of the disease state(bronchiectasis or sinusitis), or inadequacy of currently available therapy (eg no rx available for non-acid gerd, the most likely dx here)  I had an extended discussion with the patient today lasting 15 to 20 minutes of a 25 minute visit on the following issues: The standardized cough guidelines published in Chest by [place] in 2006 are still the best available and consist of a multiple step process (up to 12!) , not a single office visit,  and are intended  to address this problem logically,  with an alogrithm dependent on response to empiric treatment at  each progressive step  to determine a specific diagnosis with  minimal addtional testing needed. Therefore if adherence is an issue or can't be accurately verified,  it's very unlikely the standard evaluation and treatment will be successful here.    Furthermore, response to therapy (other than acute cough suppression, which should only be used short term with avoidance of narcotic containing cough syrups if possible), can be a gradual process for which the patient may perceive immediate benefit.  Unlike going to an eye doctor where the best perscription is almost always the first one and is immediately effective, this is almost never the case in the management of chronic cough syndromes. Therefore the patient needs to commit up front to consistently adhere to recommendations  for up to 6 weeks of therapy directed at the likely underlying problem(s) before the response can be reasonably evaluated.   For now plan to max rx for gerd and "the reflex to reflux" with 1st gen H1 as orginally rec  See instructions for specific recommendations which were reviewed directly with the patient who was given a copy with highlighter outlining the key components.

## 2013-02-18 ENCOUNTER — Encounter

## 2013-02-21 ENCOUNTER — Ambulatory Visit (INDEPENDENT_AMBULATORY_CARE_PROVIDER_SITE_OTHER)
Admission: RE | Admit: 2013-02-21 | Discharge: 2013-02-21 | Disposition: A | Discharge status: Home / Self Care | Payer: Medicare Other | Source: Ambulatory Visit | Attending: Internal Medicine | Admitting: Internal Medicine

## 2013-02-21 ENCOUNTER — Encounter: Payer: Self-pay | Admitting: Internal Medicine

## 2013-02-21 DIAGNOSIS — R05 Cough: Secondary | ICD-10-CM

## 2013-02-21 DIAGNOSIS — R059 Cough, unspecified: Secondary | ICD-10-CM

## 2013-02-21 NOTE — Progress Notes (Signed)
Quick Note:  Spoke with pt and notified of results per Dr. Wert. Pt verbalized understanding and denied any questions.  ______ 

## 2013-03-17 NOTE — Progress Notes (Signed)
HISTORY OF PRESENT ILLNESS  Allison Newman is a 73 y.o. female. States that her diabetic and lipid and labs are done by her Endo few weeks ago and results were promissing and improved  HPI  Back, rt hip pain  [name]. This is a new problem. Episode onset: 3 yrs ago, morbid obese, she is not working unable to wash dishes and hang clothes, worsens going up and down the steps . The problem occurs constantly. The problem has changed and worsening since onset. The pain is present in the lower back. The quality of the pain is described as dull. The pain is at a severity of 8/10. Associated symptoms include limited range of motion. Pertinent negatives include no numbness, ++ stiffness, ++ tingling b/l, no itching, + back pain and no neck pain. The symptoms are aggravated by movement and palpation. There has been no history of extremity trauma. no incontinence of urine nor of stool    Current Outpatient Prescriptions   Medication Sig Dispense Refill   ??? Omeprazole delayed release (PRILOSEC D/R) 20 mg tablet Take 1 tablet by mouth two (2) times a day. 30 min before meal twice daily  60 tablet  6   ??? diclofenac EC (VOLTAREN) 75 mg EC tablet take 1 tablet by mouth twice a day with food  30 tablet  3   ??? Lancets (FREESTYLE LANCETS) misc Test blood sugar 4 times a day  1 Package  11   ??? metoprolol (LOPRESSOR) 50 mg tablet take 1 tablet by mouth twice a day  60 tablet  7   ??? COMBIVENT RESPIMAT 20-100 mcg/actuation inhaler inhale 1 puff by mouth every 6 hours  1 Inhaler  11   ??? amlodipine (NORVASC) 10 mg tablet Take 1 tablet by mouth daily.  30 tablet  7   ??? gabapentin (NEURONTIN) 100 mg capsule Take 1 capsule by mouth three (3) times daily.  90 capsule  3   ??? JANUMET 50-1,000 mg per tablet take 1 tablet by mouth twice a day with food  60 tablet  3   ??? triamcinolone acetonide (KENALOG) 0.1 % topical cream Apply  to affected area two (2) times a day. use thin layer  45 g  1   ??? simethicone 125 mg  chewable tablet Take 125 mg by mouth every six (6) hours as needed (gas).  60 Tab  5   ??? sitaGLIPtin-metFORMIN (JANUMET) 50-1,000 mg per tablet take 1 tablet by mouth twice a day with food  60 Tab  3   ??? simvastatin (ZOCOR) 40 mg tablet Take 0.5 Tabs by mouth nightly.  30 Tab  6   ??? triamterene-hydrochlorothiazide (DYAZIDE) 37.5-25 mg per capsule Take 1 Cap by mouth daily.  30 Cap  3   ??? ipratropium-albuterol (COMBIVENT RESPIMAT) 20-100 mcg/actuation inhaler Take 1 Puff by inhalation every six (6) hours.  1 Inhaler  6   ??? glucose blood VI test strips (FREESTYLE LITE STRIPS) strip Test twice daily.  1 Package  11   ??? aspirin 81 mg tablet Take 81 mg by mouth.         No Known Allergies  Past Medical History   Diagnosis Date   ??? Hypertension    ??? Diabetes    ??? Arthritis    ??? Gastrointestinal disorder      GERD   ??? 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, adult 08/28/2011   ??? 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 Neuropathic arthritis  08/28/2011   ??? Elevated BUN 09/13/2011   ??? Rash, skin 09/25/2012   ??? 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 Hx orthopaedic       knee replacement     No family history on file.  History   Substance Use Topics   ??? Smoking status: Never Smoker    ??? Smokeless tobacco: Not on file   ??? Alcohol Use: No        Component Value Date/Time   WBC 6.2 12/04/2012  5:35 PM   HGB 12.1 12/04/2012  5:35 PM   HCT 36.8 12/04/2012  5:35 PM   PLATELET 140 12/04/2012  5:35 PM   MCV 88.0 12/04/2012  5:35 PM       Component Value Date/Time   Hemoglobin A1c 6.5 08/28/2012 10:56 AM   Hemoglobin A1c 6.4 05/15/2012  9:02 AM   Hemoglobin A1c 6.2 01/01/2012  8:45 AM   Glucose 166 12/04/2012  5:35 PM   Glucose (POC) 143 01/03/2008  9:21 AM   Microalb/Creat ratio (ug/mg creat.) 3.4 08/28/2012 10:57 AM   LDL, calculated 91 08/28/2012 10:56 AM   Creatinine 1.00 12/04/2012  5:35 PM        Component Value Date/Time   Cholesterol, total 171 08/28/2012 10:56 AM   HDL Cholesterol 62 08/28/2012 10:56  AM   LDL, calculated 91 08/28/2012 10:56 AM   Triglyceride 90 08/28/2012 10:56 AM       Component Value Date/Time   ALT 25 12/04/2012  5:35 PM   ALT 20 12/04/2012  5:35 PM   AST 16 12/04/2012  5:35 PM   AST 18 12/04/2012  5:35 PM   Alk. phosphatase 63 12/04/2012  5:35 PM   Alk. phosphatase 64 12/04/2012  5:35 PM   Bilirubin, direct 0.1 12/04/2012  5:35 PM   Bilirubin, total 0.3 12/04/2012  5:35 PM   Bilirubin, total 0.2 12/04/2012  5:35 PM       Component Value Date/Time   GFR est AA >60 12/04/2012  5:35 PM   GFR est non-AA 55 12/04/2012  5:35 PM   Creatinine 1.00 12/04/2012  5:35 PM   BUN 26 12/04/2012  5:35 PM   Sodium 147 12/04/2012  5:35 PM   Potassium 4.2 12/04/2012  5:35 PM   Chloride 108 12/04/2012  5:35 PM   CO2 27 12/04/2012  5:35 PM        Component Value Date/Time   TSH 0.397 08/28/2012 10:56 AM   T4 8.9 06/27/2012  9:33 AM         Review of Systems   Constitutional: Negative for fever and chills.   HENT: Negative for ear pain and nosebleeds.    Eyes: Negative for blurred vision, pain and discharge.   Respiratory: Negative for shortness of breath.    Cardiovascular: Negative for chest pain and leg swelling.   Gastrointestinal: Negative for nausea, vomiting, diarrhea and constipation.   Genitourinary: Negative for frequency.   Musculoskeletal: Negative for joint pain.   Skin: Negative for itching and rash.   Neurological: Negative for headaches.   Psychiatric/Behavioral: Negative for depression. The patient is not nervous/anxious.        Physical Exam   Nursing note and vitals reviewed.  Constitutional: She is oriented to person, place, and time. She appears well-developed and well-nourished.   HENT:   Head: Normocephalic and atraumatic.   Eyes: Conjunctivae and EOM are normal.   Neck: Normal range of  motion. Neck supple.   Cardiovascular: Normal rate, regular rhythm and normal heart sounds.    No murmur heard.  Pulmonary/Chest: Effort normal and breath sounds normal. No stridor.   Abdominal: Soft. Bowel sounds are normal. She  exhibits no distension and no mass. There is no tenderness.   Musculoskeletal: Normal range of motion. She exhibits no edema.   Nl pulses, nl visual inspection nl monoF, +dystrophy and elongated Nails   Lymphadenopathy:     She has no cervical adenopathy.   Neurological: She is alert and oriented to person, place, and time.   Skin: No erythema.   Psychiatric: Her behavior is normal.       ASSESSMENT and PLAN  Migdalia was seen today for follow-up and follow-up.    Diagnoses and associated orders for this visit:    Diabetes mellitus type II, uncontrolled  - Cancel: AMB POC HEMOGLOBIN A1C    HTN, goal below 130/80  - Cancel: AMB POC HEMOGLOBIN A1C    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, adult    Neuropathic arthritis    Chronic right hip pain    Other Orders  - gabapentin (NEURONTIN) 300 mg capsule; Take 1 capsule by mouth four (4) times daily.  - HYDROcodone-acetaminophen (NORCO) 7.5-325 mg per tablet; Take 1 tablet by mouth every eight (8) hours as needed for Pain.  - docusate sodium (COLACE) 100 mg capsule; Take 1 capsule by mouth three (3) times daily as needed for Constipation for up to 90 days.      stretching x2 daily for 5-10 min, rom strengthening with resistance banding 3-4 times per week, avoid heavy lifting and pushing, avoid machinary operation and driving while on any medication that could potentially cause dizziness, help with weight reduction, take meds w/ food and water, stool softner if opioid base meds given, dependency and tolerancy were also addressed,  meds side effects and compliance advised, rtc if worsens.    lab results and schedule of future lab studies reviewed with patient  reviewed diet, exercise and weight control  cardiovascular risk and specific lipid/LDL goals reviewed  reviewed medications and side effects in detail  use of aspirin to prevent MI and TIA's discussed

## 2013-03-17 NOTE — Patient Instructions (Signed)
Back Pain, Emergency or Urgent Symptoms: After Your Visit  Your Care Instructions  Many people have back pain at one time or another. In most cases, pain gets better with self-care that includes over-the-counter pain medicine, ice, heat, and exercises.  Unless you have symptoms of a severe injury or heart attack, you may be able to give yourself a few days before you call a doctor. But some back problems are very serious. Do not ignore symptoms that need to be checked right away. Call your doctor if:  ?? You have pain with numbness or bladder or bowel problems.  ?? You have pain, weakness, or numbness in one or both legs or you have trouble walking.  ?? You have a fever with back pain that has just started.  Follow-up care is a key part of your treatment and safety. Be sure to make and go to all appointments, and call your doctor if you are having problems. It???s also a good idea to know your test results and keep a list of the medicines you take.  How can you care for yourself at home?  ?? Sit or lie in positions that are most comfortable and that reduce your pain. Try one of these positions when you lie down:  ?? Lie on your back with your knees bent and supported by large pillows.  ?? Lie on the floor with your legs on the seat of a sofa or chair.  ?? Lie on your side with your knees and hips bent and a pillow between your legs.  ?? Lie on your stomach if it does not make pain worse.  ?? Do not sit up in bed, and avoid soft couches and twisted positions. Bed rest can help relieve pain at first, but it delays healing. Avoid bed rest after the first day.  ?? Change positions every 30 minutes. If you must sit for long periods of time, take breaks from sitting. Get up and walk around, or lie flat.  ?? Try using a heating pad on a low or medium setting, for 15 to 20 minutes every 2 or 3 hours. Try a warm shower in place of one session with the heating pad. You can also buy single-use heat wraps that last up to 8 hours. You can  also try ice or cold packs on your back for 10 to 20 minutes at a time, several times a day. (Put a thin cloth between the ice pack and your skin.) This reduces pain and makes it easier to be active and exercise.  ?? Take pain medicines exactly as directed.  ?? If the doctor gave you a prescription medicine for pain, take it as prescribed.  ?? If you are not taking a prescription pain medicine, ask your doctor if you can take an over-the-counter medicine.  When should you call for help?  Call 911 anytime you think you may need emergency care. For example, call if:  ?? You have symptoms of a heart attack. These may include:  ?? Chest pain or pressure, or a strange feeling in the chest.  ?? Sweating.  ?? Shortness of breath.  ?? Nausea or vomiting.  ?? Pain, pressure, or a strange feeling in the back, neck, jaw, or upper belly or in one or both shoulders or arms.  ?? Lightheadedness or sudden weakness.  ?? A fast or irregular heartbeat.  After you call 911, the operator may tell you to chew 1 adult-strength or 2 to 4 low-dose aspirin. Wait   for an ambulance. Do not try to drive yourself.  ?? You have signs of a severe spinal injury. These may include:  ?? Loss of bowel or bladder control (not being able to hold your urine or bowel movements).  ?? Weakness in the legs.  ?? Numbness or tingling in the rectal area, genital area, or legs.  Call your doctor now or seek immediate medical care if:  ?? You have severe back pain that is not from any known injury.  ?? You have severe back pain at night that will not go away.  ?? You are not able to urinate.  ?? You have back pain and:  ?? You have injured your back while lifting or doing some other activity. If the pain is severe, has not gone away after 1 or 2 days, and you cannot do your normal daily activities, call your doctor.  ?? You have had a back injury before that needed treatment.  ?? Your pain has lasted longer than 4 weeks.  ?? You have had weight loss you cannot explain.  ?? You are age  50 or older.  ?? You have cancer now or have had it before.  Watch closely for changes in your health, and be sure to contact your doctor if:  ?? You do not get better as expected.   Where can you learn more?   Go to http://www.healthwise.net/[facility]  Enter S904 in the search box to learn more about "Back Pain, Emergency or Urgent Symptoms: After Your Visit."   ?? 2006-2014 Healthwise, Inc[employer]. Care instructions adapted under license by [place] (which disclaims liability or warranty for this information). This care instruction is for use with your licensed healthcare professional. If you have questions about a medical condition or this instruction, always ask your healthcare professional. Healthwise, Incorporated disclaims any warranty or liability for your use of this information.  Content Version: 10.2.346038; Current as of: August 07, 2012              Back Stretches: Exercises  Your Care Instructions  Here are some examples of exercises for stretching your back. Start each exercise slowly. Ease off the exercise if you start to have pain.  Your doctor or physical therapist will tell you when you can start these exercises and which ones will work best for you.  How to do the exercises  Overhead stretch    1. Stand comfortably with your feet shoulder-width apart.  2. Looking straight ahead, raise both arms over your head and reach toward the ceiling. Do not allow your head to tilt back.  3. Hold for 15 to 30 seconds, then lower your arms to your sides.  4. Repeat 2 to 4 times.  Side stretch    1. Stand comfortably with your feet shoulder-width apart.  2. Raise one arm over your head, and then lean to the other side.  3. Slide your hand down your leg as you let the weight of your arm gently stretch your side muscles. Hold for 15 to 30 seconds.  4. Repeat 2 to 4 times on each side.  Press-up    1. Lie on your stomach, supporting your body with your forearms.  2. Press your elbows down into the floor to raise  your upper back. As you do this, relax your stomach muscles and allow your back to arch without using your back muscles. As your press up, do not let your hips or pelvis come off the floor.  3.   Hold for 15 to 30 seconds, then relax.  4. Repeat 2 to 4 times.  Relax and rest    1. Lie on your back with a rolled towel under your neck and a pillow under your knees. Extend your arms comfortably to your sides.  2. Relax and breathe normally.  3. Remain in this position for about 10 minutes.  4. If you can, do this 2 or 3 times each day.  Follow-up care is a key part of your treatment and safety. Be sure to make and go to all appointments, and call your doctor if you are having problems. It's also a good idea to know your test results and keep a list of the medicines you take.   Where can you learn more?   Go to http://www.healthwise.net/[facility]  Enter Y090 in the search box to learn more about "Back Stretches: Exercises."   ?? 2006-2014 Healthwise, Inc[employer]. Care instructions adapted under license by [place] (which disclaims liability or warranty for this information). This care instruction is for use with your licensed healthcare professional. If you have questions about a medical condition or this instruction, always ask your healthcare professional. Healthwise, Incorporated disclaims any warranty or liability for your use of this information.  Content Version: 10.2.346038; Current as of: August 07, 2012

## 2013-03-17 NOTE — Progress Notes (Signed)
Chief Complaint   Patient presents with   ??? Follow-up     DM   ??? Follow-up     HTN

## 2013-03-20 NOTE — Telephone Encounter (Signed)
Nurses Name: Wonda CeriseDiane Trujillo, RN  Telephone: [phone]  e-mail: [email or web address]   1. Concerns - Care Needs ??? Goals:     Member agreed to enroll in good help [employer]    Member dealing with left knee pain, needs TKR.  Has low back/leg pain from sciatica, pain level 7-8/10    Does not have advance directives    Member wanted resources for transportation     2. What was done by the Nurse:    Enrolled member in [employer]    Gave contact information for [employer] for transportation resources    Will mail out forms/information for advance directives     3. Physician Next Steps:    N/A

## 2013-04-01 ENCOUNTER — Ambulatory Visit (INDEPENDENT_AMBULATORY_CARE_PROVIDER_SITE_OTHER): Payer: 59 | Admitting: Internal Medicine

## 2013-04-01 ENCOUNTER — Encounter: Payer: Self-pay | Admitting: Internal Medicine

## 2013-04-01 ENCOUNTER — Telehealth: Payer: Self-pay | Admitting: Internal Medicine

## 2013-04-01 ENCOUNTER — Ambulatory Visit (INDEPENDENT_AMBULATORY_CARE_PROVIDER_SITE_OTHER)
Admission: RE | Admit: 2013-04-01 | Discharge: 2013-04-01 | Disposition: A | Discharge status: Home / Self Care | Payer: 59 | Source: Ambulatory Visit | Attending: Internal Medicine | Admitting: Internal Medicine

## 2013-04-01 VITALS — BP 130/84 | HR 70 | Temp 98.3°F | Ht 67.5 in | Wt 228.4 lb

## 2013-04-01 DIAGNOSIS — R058 Other specified cough: Secondary | ICD-10-CM

## 2013-04-01 DIAGNOSIS — R9389 Abnormal findings on diagnostic imaging of other specified body structures: Secondary | ICD-10-CM

## 2013-04-01 DIAGNOSIS — R05 Cough: Secondary | ICD-10-CM

## 2013-04-01 DIAGNOSIS — R059 Cough, unspecified: Secondary | ICD-10-CM

## 2013-04-01 NOTE — Progress Notes (Signed)
Subjective:    Patient ID: Margaret RenoJoann W Weekes, female    DOB: 18-Apr-1940    MRN: [id number]   Brief patient profile:  [age] yobf never regular smoker retired from [employer] with new onset cough waxes and wanes x 2010 referred by Dr Nehemiah SettlePolite for eval of cough and abn cxr referred  01/03/2013   To pulmonary clinic.   History of Present Illness  01/03/2013 1st [facility] office visit/ Wert cc chronic cough x 4 years can get better for up to 1 month at a time and zyrtec x one year used each am  With no impact on the cough. Just finished round of tussionex which usually works and better and off  tussionex x one week s relapse.  Cough is dry and worse at hs.  rec Pantoprazole (protonix) 40 mg   Take 30-60 min before first meal of the day and Pepcid 20 mg one bedtime plus Chlortrimeton 4 mg until return to office - this is the best way to tell whether stomach acid is contributing to your problem.   GERD diet     02/14/2013 f/u ov/Wert re: chronic cough 50% vs baseline x 4 y/ did not take chlortrimeton Chief Complaint  Patient presents with  . Follow-up    Pt states her cough has improved since the last visit.   continues dry and most notable at hs rec Pantoprazole (protonix) 40 mg   Take 30-60 min before first meal of the day and Pepcid 20 mg one bedtime plus Chlortrimeton 4 mg 1-2  at bedtime  until return to office - this is the best way to tell whether stomach acid is contributing to your problem.   GERD diet   04/01/2013 f/u ov/Wert re: cough x 4 years much better on rx for GERD with documented HH on CT Chief Complaint  Patient presents with  . Followup with CXR    Cough is much improved, but not completely resolved. Bothers her most at night.   only using one chlortrimeton at hs, better than she's been in 4 y  No obvious day to day or daytime variabilty or assoc  sob  cp or chest tightness, subjective wheeze overt sinus or hb symptoms. No unusual exp hx or h/o childhood pna/ asthma or  knowledge of premature birth.  Sleeping ok without nocturnal  or early am exacerbation  of respiratory  c/o's or need for noct saba. Also denies any obvious fluctuation of symptoms with weather or environmental changes or other aggravating or alleviating factors except as outlined above   Current Medications, Allergies, Complete Past Medical History, Past Surgical History, Family History, and Social History were reviewed in [facility] electronic medical record.  ROS  The following are not active complaints unless bolded sore throat, dysphagia, dental problems, itching, sneezing,  nasal congestion or excess/ purulent secretions, ear ache,   fever, chills, sweats, unintended wt loss, pleuritic or exertional cp, hemoptysis,  orthopnea pnd or leg swelling, presyncope, palpitations, heartburn, abdominal pain, anorexia, nausea, vomiting, diarrhea  or change in bowel or urinary habits, change in stools or urine, dysuria,hematuria,  rash, arthralgias, visual complaints, headache, numbness weakness or ataxia or problems with walking or coordination,  change in mood/affect or memory.              Objective:   Physical Exam  amb bf nad  02/14/2013     219  > 04/01/2013 228 Wt Readings from Last 3 Encounters:  01/03/13 219 lb 6.4 oz (99.519 kg)  HEENT: nl dentition, turbinates, and orophanx. Nl external ear canals without cough reflex   NECK :  without JVD/Nodes/TM/ nl carotid upstrokes bilaterally   LUNGS: no acc muscle use, clear to A and P bilaterally without cough on insp or exp maneuvers   CV:  RRR  no s3 or murmur or increase in P2, no edema   ABD:  soft and nontender with nl excursion in the supine position. No bruits or organomegaly, bowel sounds nl  MS:  warm without deformities, calf tenderness, cyanosis or clubbing  SKIN: warm and dry without lesions    NEURO:  alert, approp, no deficits     11/01/09 Swallowing study  small HH/ schatzki's and reflux  CT chest  10/31/12 1. Lingular and left lower lobe scarring / fibrosis. Likely  related to prior infection or inflammation. No explanation for  acute cough.  2. 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 Consider thyroid ultrasound.  3. Pulmonary artery enlargement suggests pulmonary arterial  hypertension.  4. Small hiatal hernia.  04/01/2013  1. Apparent enlargement of the cardiac silhouette without definite evidence of edema. Further evaluation with cardiac echo could be performed as clinically indicated. 2. Grossly unchanged bibasilar opacities, left greater than right, likely atelectasis.      Assessment & Plan:   Outpatient Encounter Prescriptions as of 04/01/2013  Medication Sig  . aspirin 81 MG tablet Take 81 mg by mouth daily.  Marland Kitchen atorvastatin (LIPITOR) 10 MG tablet Take 10 mg by mouth daily.  . chlorpheniramine (CHLOR-TRIMETON) 4 MG tablet Take 4 mg by mouth at bedtime.  . cholecalciferol (VITAMIN D) 1000 UNITS tablet Take 1,000 Units by mouth daily.  . famotidine (PEPCID) 20 MG tablet Take 20 mg by mouth at bedtime.  Marland Kitchen HYDROcodone-acetaminophen (NORCO/VICODIN) 5-325 MG per tablet Take 1 tablet by mouth every 12 (twelve) hours as needed for pain.  . pantoprazole (PROTONIX) 40 MG tablet Take 1 tablet (40 mg total) by mouth daily.  . valsartan (DIOVAN) 160 MG tablet Take 160 mg by mouth daily.  . [DISCONTINUED] chlorpheniramine-HYDROcodone (TUSSIONEX) 10-8 MG/5ML LQCR Take 5 mLs by mouth every 12 (twelve) hours as needed.

## 2013-04-01 NOTE — Assessment & Plan Note (Signed)
-   Sinus CT 02/21/2013 > Paranasal sinuses are clear. No mucosal edema or air-fluid level. Nasal septum midline. No acute bony change. 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

## 2013-04-01 NOTE — Patient Instructions (Addendum)
Continue the same meds for 3 months and then review long term treatment options  with Dr Nehemiah SettlePolite.   Same meds = Pantoprazole (protonix) 40 mg  (or 2 prilosec 20 over the counter) Take 30-60 min before first meal of the day and Pepcid 20 mg one bedtime plus Chlortrimeton 4 mg 1-2  at bedtime  until return to office - this is the best way to tell whether stomach acid is contributing to your problem.    GERD (REFLUX)  is an extremely common cause of respiratory symptoms, many times with no significant heartburn at all.    It can be treated with medication, but also with lifestyle changes including avoidance of late meals, excessive alcohol, smoking cessation, and avoid fatty foods, chocolate, peppermint, colas, red wine, and acidic juices such as orange juice.  NO MINT OR MENTHOL PRODUCTS SO NO COUGH DROPS  USE SUGARLESS CANDY INSTEAD (jolley ranchers or Stover's)  NO OIL BASED VITAMINS - use powdered substitutes.     If you are satisfied with your treatment plan let your doctor know and he/she can either refill your medications or you can return here when your prescription runs out.     If in any way you are not 100% satisfied,  please tell us.  If 100% better, tell your friends!

## 2013-04-01 NOTE — Assessment & Plan Note (Signed)
cxr today shows no acute changes so likely she has had previous ali ( ? CAP/ ? Asp related to GERD)  She does not cough or insp nor have any limiting sob so don't feel further pulmonary eval needed at this point    Each maintenance medication was reviewed in detail including most importantly the difference between maintenance and as needed and under what circumstances the prns are to be used.  Please see instructions for details which were reviewed in writing and the patient given a copy.

## 2013-04-01 NOTE — Telephone Encounter (Signed)
error 

## 2013-04-01 NOTE — Telephone Encounter (Signed)
Pt called regarding the pain she's having on her rt thigh and groin area. Pt wants an ultrasound. Pt can be reached @ [phone] or [phone]

## 2013-04-01 NOTE — Telephone Encounter (Signed)
Spoke with patient requesting follow up appointment right leg and groin pain.

## 2013-04-08 NOTE — Progress Notes (Signed)
HISTORY OF PRESENT ILLNESS  Allison Newman is a 73 y.o. female.  HPI  Groin rt pain  [name]. This is a new problem. Episode onset:6 months ago, ++morbid obese, she is not working , does not heavy pushing and heavy lifting, pt is able to wash dishes and hang clothes, not worsens going up and down the steps . The problem occurs constantly. The problem has changed and worsening since onset. The pain is present in the rt groin area. The quality of the pain is described as dull. The pain is at a severity of 8/10. Associated symptoms include limited range of motion. Pertinent negatives include no numbness, no stiffness, no tingling b/l, no itching, + back pain and no neck pain. The symptoms are aggravated by movement and palpation. There has been no history of extremity trauma. no incontinence of urine nor of stool      Current Outpatient Prescriptions   Medication Sig Dispense Refill   ??? cyanocobalamin 1,000 mcg tablet Take 1,000 mcg by mouth daily.       ??? Cholecalciferol, Vitamin D3, 50,000 unit cap Take  by mouth.       ??? JANUMET 50-1,000 mg per tablet take 1 tablet by mouth twice a day with food  60 tablet  3   ??? gabapentin (NEURONTIN) 300 mg capsule Take 1 capsule by mouth four (4) times daily.  120 capsule  2   ??? HYDROcodone-acetaminophen (NORCO) 7.5-325 mg per tablet Take 1 tablet by mouth every eight (8) hours as needed for Pain.  90 tablet  0   ??? docusate sodium (COLACE) 100 mg capsule Take 1 capsule by mouth three (3) times daily as needed for Constipation for up to 90 days.  90 capsule  3   ??? Omeprazole delayed release (PRILOSEC D/R) 20 mg tablet Take 1 tablet by mouth two (2) times a day. 30 min before meal twice daily  60 tablet  6   ??? diclofenac EC (VOLTAREN) 75 mg EC tablet take 1 tablet by mouth twice a day with food  30 tablet  3   ??? Lancets (FREESTYLE LANCETS) misc Test blood sugar 4 times a day  1 Package  11   ??? metoprolol (LOPRESSOR) 50 mg tablet take 1 tablet by  mouth twice a day  60 tablet  7   ??? COMBIVENT RESPIMAT 20-100 mcg/actuation inhaler inhale 1 puff by mouth every 6 hours  1 Inhaler  11   ??? amlodipine (NORVASC) 10 mg tablet Take 1 tablet by mouth daily.  30 tablet  7   ??? gabapentin (NEURONTIN) 100 mg capsule Take 1 capsule by mouth three (3) times daily.  90 capsule  3   ??? triamcinolone acetonide (KENALOG) 0.1 % topical cream Apply  to affected area two (2) times a day. use thin layer  45 g  1   ??? simethicone 125 mg chewable tablet Take 125 mg by mouth every six (6) hours as needed (gas).  60 Tab  5   ??? sitaGLIPtin-metFORMIN (JANUMET) 50-1,000 mg per tablet take 1 tablet by mouth twice a day with food  60 Tab  3   ??? simvastatin (ZOCOR) 40 mg tablet Take 0.5 Tabs by mouth nightly.  30 Tab  6   ??? triamterene-hydrochlorothiazide (DYAZIDE) 37.5-25 mg per capsule Take 1 Cap by mouth daily.  30 Cap  3   ??? ipratropium-albuterol (COMBIVENT RESPIMAT) 20-100 mcg/actuation inhaler Take 1 Puff by inhalation every six (6)  hours.  1 Inhaler  6   ??? glucose blood VI test strips (FREESTYLE LITE STRIPS) strip Test twice daily.  1 Package  11   ??? aspirin 81 mg tablet Take 81 mg by mouth.         No Known Allergies  Past Medical History   Diagnosis Date   ??? Hypertension    ??? Diabetes    ??? Arthritis    ??? Gastrointestinal disorder      GERD   ??? 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, adult 08/28/2011   ??? 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 Elevated BUN 09/13/2011   ??? Rash, skin 09/25/2012   ??? 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 Hx orthopaedic       knee replacement     No family history on file.  History   Substance Use Topics   ??? Smoking status: Never Smoker    ??? Smokeless tobacco: Not on file   ??? Alcohol Use: No        Component Value Date/Time   WBC 6.2 12/04/2012  5:35 PM   HGB 12.1 12/04/2012  5:35 PM   HCT 36.8 12/04/2012  5:35 PM   PLATELET 140 12/04/2012  5:35 PM    MCV 88.0 12/04/2012  5:35 PM       Component Value Date/Time   TSH 0.397 08/28/2012 10:56 AM   T4 8.9 06/27/2012  9:33 AM         Review of Systems   Constitutional: Negative for fever and chills.   HENT: Negative for ear pain and nosebleeds.    Eyes: Negative for blurred vision, pain and discharge.   Respiratory: Negative for shortness of breath.    Cardiovascular: Negative for chest pain and leg swelling.   Gastrointestinal: Positive for abdominal pain. Negative for nausea, vomiting, diarrhea and constipation.   Genitourinary: Negative for frequency.   Musculoskeletal: Positive for joint pain.   Skin: Negative for itching and rash.   Neurological: Negative for headaches.   Psychiatric/Behavioral: Negative for depression. The patient is not nervous/anxious.        Physical Exam   Nursing note and vitals reviewed.  Constitutional: She is oriented to person, place, and time. She appears well-developed and well-nourished.   HENT:   Head: Normocephalic and atraumatic.   Eyes: Conjunctivae and EOM are normal. Pupils are equal, round, and reactive to light.   Neck: No JVD present. No thyromegaly present.   Cardiovascular: Normal rate, regular rhythm, normal heart sounds and intact distal pulses.  Exam reveals no gallop and no friction rub.    No murmur heard.  Pulmonary/Chest: Effort normal and breath sounds normal. No stridor. No respiratory distress. She has no wheezes. She has no rales.   Abdominal: Soft. Bowel sounds are normal. She exhibits no distension and no mass. There is no tenderness.   Musculoskeletal: Normal range of motion. She exhibits no edema and no tenderness.   Lymphadenopathy:     She has no cervical adenopathy.   Neurological: She is alert and oriented to person, place, and time. She has normal reflexes. No cranial nerve deficit.   Skin: No rash noted. No erythema.   Psychiatric: She has a normal mood and affect. Her behavior is normal.       ASSESSMENT and PLAN  Allison Newman was seen today for groin pain.  Diagnoses and associated orders for this visit:    Abdominal pain, chronic, right lower quadrant  - CT ABD W WO CONT; Future    Gallbladder polyp  - CT ABD W WO CONT; Future    Other Orders  - cyanocobalamin 1,000 mcg tablet; Take 1,000 mcg by mouth daily.  - Cholecalciferol, Vitamin D3, 50,000 unit cap; Take  by mouth.  - HYDROcodone-acetaminophen (NORCO) 7.5-325 mg per tablet; Take 1 tablet by mouth every eight (8) hours as needed for Pain.  - docusate sodium (COLACE) 100 mg capsule; Take 1 capsule by mouth three (3) times daily as needed for Constipation for up to 90 days.      avoid heavy pushing lifting if the problem persist of worsens needs to go to ER pt agreed  lab results and schedule of future lab studies reviewed with patient  reviewed diet, exercise and weight control  cardiovascular risk and specific lipid/LDL goals reviewed  reviewed medications and side effects in detail  use of aspirin to prevent MI and TIA's discussed

## 2013-04-08 NOTE — Progress Notes (Signed)
Chief Complaint   Patient presents with   ??? Groin Pain     Right

## 2013-04-09 NOTE — Telephone Encounter (Signed)
Spoke with patient informed to cal CT patient [facility] she acknowledged understanding.

## 2013-04-09 NOTE — Telephone Encounter (Signed)
Patient having CT Scan on 04/11/13 - wants to know which medications she should not take that day        Best number to reach her is  [phone]

## 2013-04-11 LAB — CREATININE, POC
Creatinine (POC): 0.8 MG/DL (ref 0.6–1.3)
GFRAA, POC: >60 mL/min/{1.73_m2} (ref >60)
GFRNA, POC: >60 mL/min/{1.73_m2} (ref >60)

## 2013-04-12 MED ORDER — IOVERSOL 350 MG/ML IV SOLN
350 mg iodine/mL | Freq: Once | INTRAVENOUS | Status: AC
Start: 2013-04-12 — End: 2013-04-11
  Administered 2013-04-12: 01:00:00 via INTRAVENOUS

## 2013-04-23 ENCOUNTER — Encounter

## 2013-04-23 NOTE — Telephone Encounter (Signed)
Pt called requesting recent test results. Pt is also requesting refills for glucose blood VI test strips (FREESTYLE LITE STRIPS) strip & Lancets (FREESTYLE LANCETS) misc. Pt is also requesting a call back @ [phone] stating she is sick.

## 2013-04-23 NOTE — Telephone Encounter (Signed)
Refill request pended and message sent to Dr Demetrios IsaacsAshrafi

## 2013-04-24 MED ORDER — BLOOD SUGAR DIAGNOSTIC TEST STRIPS
PACK | Status: DC
Start: 2013-04-24 — End: 2016-11-06

## 2013-04-24 MED ORDER — LANCETS
PACK | Status: DC
Start: 2013-04-24 — End: 2017-04-24

## 2013-04-24 NOTE — Telephone Encounter (Signed)
Patient is requesting a return call back from the nurse with her test results from her OV on 04-08-13.    Best # to contact patient: [phone] (M)

## 2013-04-24 NOTE — Telephone Encounter (Signed)
Message sent to Dr Ashrafi

## 2013-04-30 NOTE — Telephone Encounter (Signed)
-----   Message from [place] sent at 04/30/2013  1:19 PM EST -----  Regarding: Dr. Michelene HeadyAshrafi/Telephone  Pt reports [address] [facility] [address]  [phone] needs forms for Medicare and refill responses on Diclofenac and her lancets and test strips for diabetes. First requests were made  last week before snow. Please f/up as soon as possible.  Also, pt needs a call back on CT  test results from 3 weeks ago. Pt can be reached on [phone].

## 2013-04-30 NOTE — Telephone Encounter (Signed)
Returned call to pt.  Informed her that Dr Demetrios IsaacsAshrafi had not completed her Medicare form.  Also informed her that her diclofenic was pending but her lancets and strips was approved.  Message sent to Dr Demetrios IsaacsAshrafi in reference to her CT results

## 2013-05-02 MED ORDER — DICLOFENAC 75 MG TAB, DELAYED RELEASE
75 mg | ORAL_TABLET | ORAL | Status: DC
Start: 2013-05-02 — End: 2013-08-07

## 2013-05-05 ENCOUNTER — Other Ambulatory Visit: Payer: Self-pay

## 2013-05-05 DIAGNOSIS — Z803 Family history of malignant neoplasm of breast: Secondary | ICD-10-CM

## 2013-05-05 DIAGNOSIS — Z1231 Encounter for screening mammogram for malignant neoplasm of breast: Secondary | ICD-10-CM

## 2013-05-05 NOTE — Telephone Encounter (Signed)
Done so

## 2013-05-07 ENCOUNTER — Ambulatory Visit
Admission: RE | Admit: 2013-05-07 | Discharge: 2013-05-07 | Disposition: A | Discharge status: Home / Self Care | Payer: Medicare PPO | Source: Ambulatory Visit | Attending: Nurse Practitioner | Admitting: Nurse Practitioner

## 2013-05-07 ENCOUNTER — Other Ambulatory Visit: Payer: Self-pay | Admitting: Nurse Practitioner

## 2013-05-07 ENCOUNTER — Ambulatory Visit
Admission: RE | Admit: 2013-05-07 | Discharge: 2013-05-07 | Disposition: A | Discharge status: Home / Self Care | Payer: Medicare PPO | Source: Ambulatory Visit

## 2013-05-07 DIAGNOSIS — M545 Low back pain, unspecified: Secondary | ICD-10-CM

## 2013-05-07 DIAGNOSIS — M25559 Pain in unspecified hip: Secondary | ICD-10-CM

## 2013-05-07 DIAGNOSIS — Z803 Family history of malignant neoplasm of breast: Secondary | ICD-10-CM

## 2013-05-07 DIAGNOSIS — Z1231 Encounter for screening mammogram for malignant neoplasm of breast: Secondary | ICD-10-CM

## 2013-05-08 ENCOUNTER — Encounter

## 2013-05-08 NOTE — Telephone Encounter (Signed)
R/w'd referral given

## 2013-06-02 NOTE — Progress Notes (Signed)
Encounter Date: 06/02/2013  History and Physical    Assessment:   Small fat containing umbilical hernia.  Likely radiculopathy.  ???L2??? symptoms ?? symptoms over the sacrum, iliac crest, lateral groin, proximal anterior thigh & paravertebral pain .  Deep anterior abdominal pain or symptoms.  ?? occasional L3 symptoms.    Comorbid severe morbid obesity, secondary DJD.  Body mass index is 58.33 kg/(m^2).    Plan:   Expectant management.  Too high an operative risk given BMI 58.    Encouraged weight loss.      HPI:   Allison Newman is a 73 y.o. female who is seen in consultation at the request of Purnell ShoemakerAbbas Ashrafi, MD for ventral hernia.  She had CT for RLQ pain.  Fat umbilical hernia.       Patient cannot feel it.      Patient does not have urinary symptoms.  Patient does not have difficulty with bowel movements.        Past Medical History   Diagnosis Date   ??? Hypertension    ??? Diabetes (HCC)    ??? Arthritis    ??? Gastrointestinal disorder      GERD   ??? 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 Elevated BUN 09/13/2011   ??? Rash, skin 09/25/2012   ??? 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 Chronic right hip pain 03/17/2013   ??? Gallbladder polyp 04/08/2013   ??? Umbilical hernia 05/08/2013   ??? Abdominal pain 05/08/2013   ??? Hypercholesterolemia    ??? GERD (gastroesophageal reflux disease)    ??? 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     Past Surgical History   Procedure Laterality Date   ??? 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 Hx orthopaedic  2000     screw in foot      Family History   Problem Relation Age of Onset   ??? Diabetes Mother    ??? Heart Disease Mother    ??? Cancer Father      lung   ??? Cancer Sister      colon & breast   ??? Cancer Brother      throat      History   Substance Use Topics   ??? Smoking status: Former Smoker -- 0.00 packs/day     Quit date: 06/03/1991   ??? Smokeless tobacco: Not on file   ??? Alcohol Use: No      Current Outpatient  Prescriptions   Medication Sig   ??? methimazole (TAPAZOLE) 5 mg tablet Take  by mouth daily.   ??? diclofenac EC (VOLTAREN) 75 mg EC tablet take 1 tablet by mouth twice a day with food   ??? glucose blood VI test strips (FREESTYLE LITE STRIPS) strip Test twice daily.   ??? Lancets (FREESTYLE LANCETS) misc Test blood sugar 4 times a day   ??? cyanocobalamin 1,000 mcg tablet Take 1,000 mcg by mouth daily.   ??? Cholecalciferol, Vitamin D3, 50,000 unit cap Take  by mouth.   ??? JANUMET 50-1,000 mg per tablet take 1 tablet by mouth twice a day with food   ??? gabapentin (NEURONTIN) 300 mg capsule Take 1 capsule by mouth four (4) times daily.   ??? Omeprazole delayed release (PRILOSEC D/R) 20 mg tablet Take 1 tablet by mouth two (2) times a day. 30 min before meal twice daily   ???  metoprolol (LOPRESSOR) 50 mg tablet take 1 tablet by mouth twice a day   ??? COMBIVENT RESPIMAT 20-100 mcg/actuation inhaler inhale 1 puff by mouth every 6 hours   ??? triamterene-hydrochlorothiazide (DYAZIDE) 37.5-25 mg per capsule Take 1 Cap by mouth daily.   ??? aspirin 81 mg tablet Take 81 mg by mouth.   ??? amLODIPine (NORVASC) 10 mg tablet take 1 tablet by mouth once daily   ??? sitaGLIPtin-metFORMIN (JANUMET) 50-1,000 mg per tablet Take 1 Tab by mouth two (2) times daily (with meals). Take one tablet in am and 0.5 tablet in evening.   ??? ergocalciferol (ERGOCALCIFEROL) 50,000 unit capsule    ??? FREESTYLE LANCETS 28 gauge misc    ??? omeprazole (PRILOSEC) 20 mg capsule    ??? HYDROcodone-acetaminophen (NORCO) 7.5-325 mg per tablet Take 1 Tab by mouth every eight (8) hours as needed for Pain.   ??? gabapentin (NEURONTIN) 100 mg capsule Take 1 capsule by mouth three (3) times daily.   ??? triamcinolone acetonide (KENALOG) 0.1 % topical cream Apply  to affected area two (2) times a day. use thin layer   ??? simethicone 125 mg chewable tablet Take 125 mg by mouth every six (6) hours as needed (gas).   ??? sitaGLIPtin-metFORMIN (JANUMET) 50-1,000 mg per tablet take 1 tablet by mouth  twice a day with food   ??? ipratropium-albuterol (COMBIVENT RESPIMAT) 20-100 mcg/actuation inhaler Take 1 Puff by inhalation every six (6) hours.     No current facility-administered medications for this visit.       Allergies:  No Known Allergies     Review of Systems:  10 systems reviewed.  See scanned sheet in "Media" section.  See HPI for pertinent positives and negatives.      Objective:     Visit Vitals   Item Reading   ??? BP 164/84   ??? Pulse 80   ??? Ht 5\' 4"  (1.626 m)   ??? Wt 154.223 kg (340 lb)   ??? BMI 58.33 kg/m2       Physical Exam:  General appearance  Alert, cooperative, no distress, appears stated age   HEENT Anicteric           Lungs   Clear to auscultation bilaterally   Heart  Regular rate and rhythm.  No murmur, rub or gallop   Abdomen   Massively obese.  Soft.  Presently NT.                    Neurologic Without overt sensory or motor deficit     Signed By: Ernest Mallick, MD     August 06, 2013

## 2013-06-04 MED ORDER — HYDROCODONE-ACETAMINOPHEN 7.5 MG-325 MG TAB
ORAL_TABLET | Freq: Three times a day (TID) | ORAL | Status: DC | PRN
Start: 2013-06-04 — End: 2013-06-04

## 2013-06-04 MED ORDER — HYDROCODONE-ACETAMINOPHEN 7.5 MG-325 MG TAB
ORAL_TABLET | Freq: Three times a day (TID) | ORAL | Status: DC | PRN
Start: 2013-06-04 — End: 2013-08-20

## 2013-06-04 NOTE — Progress Notes (Signed)
HISTORY OF PRESENT ILLNESS  Allison Newman is a 73 y.o. female.  HPI     Chronic back, knees pain syndrome  Started few yrs  Ago, states that the meds given  help and make the pt capable and  able to do the activity of her daily living, not worsening, tried otc meds not helping, no swelling,  8  /10 w/out med, + abdominal upset from NSAIDS, no recent xray nor physical tx, pain persist without medication, it is a chronic condition was offered surgical correction pt denied further surgery, compliant with medication takes it as prescribed, keeps meds at a safe place, states that she is compliant with stool softner to prevent constipation    Current Outpatient Prescriptions   Medication Sig Dispense Refill   ??? sitaGLIPtin-metFORMIN (JANUMET) 50-1,000 mg per tablet Take 1 Tab by mouth two (2) times daily (with meals). Take one tablet in am and 0.5 tablet in evening.       ??? methimazole (TAPAZOLE) 5 mg tablet Take  by mouth daily.       ??? diclofenac EC (VOLTAREN) 75 mg EC tablet take 1 tablet by mouth twice a day with food  30 Tab  3   ??? glucose blood VI test strips (FREESTYLE LITE STRIPS) strip Test twice daily.  1 Package  11   ??? Lancets (FREESTYLE LANCETS) misc Test blood sugar 4 times a day  1 Package  11   ??? cyanocobalamin 1,000 mcg tablet Take 1,000 mcg by mouth daily.       ??? Cholecalciferol, Vitamin D3, 50,000 unit cap Take  by mouth.       ??? HYDROcodone-acetaminophen (NORCO) 7.5-325 mg per tablet Take 1 tablet by mouth every eight (8) hours as needed for Pain.  90 tablet  0   ??? docusate sodium (COLACE) 100 mg capsule Take 1 capsule by mouth three (3) times daily as needed for Constipation for up to 90 days.  90 capsule  3   ??? gabapentin (NEURONTIN) 300 mg capsule Take 1 capsule by mouth four (4) times daily.  120 capsule  2   ??? docusate sodium (COLACE) 100 mg capsule Take 1 capsule by mouth three (3) times daily as needed for Constipation for up to 90 days.  90 capsule  3   ??? Omeprazole delayed release (PRILOSEC  D/R) 20 mg tablet Take 1 tablet by mouth two (2) times a day. 30 min before meal twice daily  60 tablet  6   ??? metoprolol (LOPRESSOR) 50 mg tablet take 1 tablet by mouth twice a day  60 tablet  7   ??? COMBIVENT RESPIMAT 20-100 mcg/actuation inhaler inhale 1 puff by mouth every 6 hours  1 Inhaler  11   ??? amlodipine (NORVASC) 10 mg tablet Take 1 tablet by mouth daily.  30 tablet  7   ??? simvastatin (ZOCOR) 40 mg tablet Take 0.5 Tabs by mouth nightly.  30 Tab  6   ??? triamterene-hydrochlorothiazide (DYAZIDE) 37.5-25 mg per capsule Take 1 Cap by mouth daily.  30 Cap  3   ??? ipratropium-albuterol (COMBIVENT RESPIMAT) 20-100 mcg/actuation inhaler Take 1 Puff by inhalation every six (6) hours.  1 Inhaler  6   ??? aspirin 81 mg tablet Take 81 mg by mouth.       ??? JANUMET 50-1,000 mg per tablet take 1 tablet by mouth twice a day with food  60 tablet  3   ??? gabapentin (NEURONTIN) 100 mg capsule Take 1 capsule  by mouth three (3) times daily.  90 capsule  3   ??? triamcinolone acetonide (KENALOG) 0.1 % topical cream Apply  to affected area two (2) times a day. use thin layer  45 g  1   ??? simethicone 125 mg chewable tablet Take 125 mg by mouth every six (6) hours as needed (gas).  60 Tab  5   ??? sitaGLIPtin-metFORMIN (JANUMET) 50-1,000 mg per tablet take 1 tablet by mouth twice a day with food  60 Tab  3     No Known Allergies  Past Medical History   Diagnosis Date   ??? Hypertension    ??? Diabetes    ??? Arthritis    ??? Gastrointestinal disorder      GERD   ??? 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, adult 08/28/2011   ??? 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 Elevated BUN 09/13/2011   ??? Rash, skin 09/25/2012   ??? 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 Chronic right hip pain 03/17/2013   ??? Gallbladder polyp 04/08/2013   ??? Umbilical hernia 05/08/2013   ??? Abdominal pain 05/08/2013   ??? Hypercholesterolemia    ??? GERD (gastroesophageal reflux disease)      Past Surgical History   Procedure Laterality Date   ??? Hx orthopaedic  1998      knee replacement   ??? Hx orthopaedic  2000     screw in foot     Family History   Problem Relation Age of Onset   ??? Diabetes Mother    ??? Heart Disease Mother    ??? Cancer Father      lung   ??? Cancer Sister      colon & breast   ??? Cancer Brother      throat     History   Substance Use Topics   ??? Smoking status: Former Smoker -- 0.00 packs/day     Quit date: 06/03/1991   ??? Smokeless tobacco: Not on file   ??? Alcohol Use: No        Component Value Date/Time   WBC 6.2 12/04/2012  5:35 PM   HGB 12.1 12/04/2012  5:35 PM   HCT 36.8 12/04/2012  5:35 PM   PLATELET 140 12/04/2012  5:35 PM   MCV 88.0 12/04/2012  5:35 PM       Component Value Date/Time   TSH 0.397 08/28/2012 10:56 AM   T4 8.9 06/27/2012  9:33 AM         Review of Systems   Constitutional: Negative for fever and chills.   HENT: Negative for ear pain and nosebleeds.    Eyes: Negative for blurred vision, pain and discharge.   Respiratory: Negative for shortness of breath.    Cardiovascular: Negative for chest pain and leg swelling.   Gastrointestinal: Negative for nausea, vomiting, diarrhea and constipation.   Genitourinary: Negative for frequency.   Musculoskeletal: Positive for myalgias, back pain and joint pain.   Skin: Negative for itching and rash.   Neurological: Negative for headaches.   Psychiatric/Behavioral: Negative for depression. The patient has insomnia. The patient is not nervous/anxious.        Physical Exam   Constitutional: She is oriented to person, place, and time. She appears well-developed and well-nourished.   HENT:   Head: Normocephalic and atraumatic.   Eyes: Conjunctivae and EOM are normal.   Neck: Normal range of motion. Neck supple.   Cardiovascular: Normal rate, regular rhythm and normal heart  sounds.    No murmur heard.  Pulmonary/Chest: Effort normal and breath sounds normal.   Abdominal: Soft. Bowel sounds are normal. She exhibits no distension.   Musculoskeletal: She exhibits tenderness. She exhibits no edema.        Right knee: She exhibits  decreased range of motion. Tenderness found.        Left knee: She exhibits decreased range of motion. Tenderness found.        Lumbar back: She exhibits decreased range of motion, tenderness, pain and spasm.   Lymphadenopathy:     She has no cervical adenopathy.   Neurological: She is alert and oriented to person, place, and time.   Skin: No erythema.   Psychiatric: Her behavior is normal.   Nursing note and vitals reviewed.      ASSESSMENT and PLAN  Allison Newman was seen today for medication refill.    Diagnoses and associated orders for this visit:    Chronic pain disorder    Chronic right hip pain  - Discontinue: HYDROcodone-acetaminophen (NORCO) 7.5-325 mg per tablet; Take 1 Tab by mouth every eight (8) hours as needed for Pain.  - HYDROcodone-acetaminophen (NORCO) 7.5-325 mg per tablet; Take 1 Tab by mouth every eight (8) hours as needed for Pain.    Neuropathic arthritis    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, adult (HCC)      stretching x2 daily for 5-10 min, rom strengthening with resistance banding 3-4 times per week, avoid heavy lifting and pushing, avoid machinary operation and driving while on any medication that could potentially cause dizziness, help with weight reduction, take meds w/ food and water, stool softner if opioid base meds given, dependency and tolerancy were also addressed,  meds side effects and compliance advised, rtc if worsens.    lab results and schedule of future lab studies reviewed with patient  reviewed diet, exercise and weight control  reviewed medications and side effects in detail  radiology results and schedule of future radiology studies reviewed with patient

## 2013-06-04 NOTE — Progress Notes (Signed)
Chief Complaint   Patient presents with   ??? Medication Refill     [facility]

## 2013-07-10 MED ORDER — AMLODIPINE 10 MG TAB
10 mg | ORAL_TABLET | ORAL | Status: DC
Start: 2013-07-10 — End: 2013-08-20

## 2013-07-23 ENCOUNTER — Ambulatory Visit
Admission: RE | Admit: 2013-07-23 | Discharge: 2013-07-23 | Disposition: A | Discharge status: Home / Self Care | Payer: Medicare PPO | Source: Ambulatory Visit | Attending: Internal Medicine | Admitting: Internal Medicine

## 2013-07-23 ENCOUNTER — Other Ambulatory Visit: Payer: Self-pay | Admitting: Internal Medicine

## 2013-07-23 DIAGNOSIS — R609 Edema, unspecified: Secondary | ICD-10-CM

## 2013-07-24 ENCOUNTER — Encounter

## 2013-07-24 ENCOUNTER — Other Ambulatory Visit: Payer: Medicare PPO

## 2013-07-24 NOTE — Telephone Encounter (Signed)
Med pended

## 2013-07-24 NOTE — Telephone Encounter (Signed)
Patient is requesting a refill on her HYDROcodone-acetaminophen (NORCO) 7.5-325 mg per tablet.    Best # to contact patient: [phone] (H)  [phone] (M)

## 2013-07-31 NOTE — Telephone Encounter (Signed)
Call placed to pt. Informed pt that she needs an appt for the norco refill.  Pt stated understanding. Has upcoming appt on 08/20/13

## 2013-08-08 MED ORDER — DICLOFENAC 75 MG TAB, DELAYED RELEASE
75 mg | ORAL_TABLET | ORAL | Status: DC
Start: 2013-08-08 — End: 2013-11-08

## 2013-08-08 MED ORDER — GABAPENTIN 300 MG CAP
300 mg | ORAL_CAPSULE | ORAL | Status: DC
Start: 2013-08-08 — End: 2013-11-11

## 2013-08-11 ENCOUNTER — Encounter

## 2013-08-20 LAB — AMB POC HEMOGLOBIN A1C: Hemoglobin A1c (POC): 6.4 %

## 2013-08-20 MED ORDER — AMLODIPINE 10 MG TAB
10 mg | ORAL_TABLET | ORAL | Status: DC
Start: 2013-08-20 — End: 2013-08-29

## 2013-08-20 MED ORDER — HYDROCODONE-ACETAMINOPHEN 7.5 MG-325 MG TAB
ORAL_TABLET | Freq: Three times a day (TID) | ORAL | Status: DC | PRN
Start: 2013-08-20 — End: 2013-10-27

## 2013-08-20 MED ORDER — HYDROCODONE-ACETAMINOPHEN 7.5 MG-325 MG TAB
ORAL_TABLET | Freq: Three times a day (TID) | ORAL | Status: DC | PRN
Start: 2013-08-20 — End: 2013-08-20

## 2013-08-20 MED ORDER — SITAGLIPTIN-METFORMIN 50 MG-1,000 MG TAB
50-1000 mg | ORAL_TABLET | Freq: Two times a day (BID) | ORAL | Status: DC
Start: 2013-08-20 — End: 2013-11-11

## 2013-08-20 MED ORDER — TRIAMTERENE-HYDROCHLOROTHIAZIDE 37.5 MG-25 MG CAP
ORAL_CAPSULE | Freq: Every day | ORAL | Status: DC
Start: 2013-08-20 — End: 2013-12-12

## 2013-08-20 NOTE — Patient Instructions (Signed)
Nutrition Tips for Diabetes: After Your Visit  Your Care Instructions  A healthy diet is important to manage diabetes. It helps you lose weight (if you need to) and keep it off. It gives you the nutrition and energy your body needs and helps prevent heart disease. But a diet for diabetes does not mean that you have to eat special foods. You can eat what your family eats, including occasional sweets and other favorites. But you do have to pay attention to how often you eat and how much you eat of certain foods. The right plan for you will give you meals that help you keep your blood sugar at healthy levels.  Try to eat a variety of foods and to spread carbohydrate throughout the day. Carbohydrate raises blood sugar higher and more quickly than any other nutrient does. Carbohydrate is found in sugar, breads and cereals, fruit, starchy vegetables such as potatoes and corn, and milk and yogurt.  You may want to work with a dietitian or diabetes educator to help you plan meals and snacks. A dietitian or diabetes educator also can help you lose weight if that is one of your goals. The following tips can help you enjoy your meals and stay healthy.  Follow-up care is a key part of your treatment and safety. Be sure to make and go to all appointments, and call your doctor if you are having problems. It???s also a good idea to know your test results and keep a list of the medicines you take.  How can you care for yourself at home?  ?? Learn which foods have carbohydrate and how much carbohydrate to eat. A dietitian or diabetes educator can help you learn to keep track of how much carbohydrate you eat.  ?? Spread carbohydrate throughout the day. Eat some carbohydrate at all meals, but do not eat too much at any one time.  ?? Plan meals to include food from all the food groups. These are the food groups and some example portion sizes:  ?? Grains: 1 slice of bread (1 ounce), ?? cup of cooked cereal, and 1/3 cup of cooked pasta or  rice. These have about 15 grams of carbohydrate in a serving. Choose whole grains such as whole wheat bread or crackers, oatmeal, and brown rice more often than refined grains.  ?? Fruit: 1 small fresh fruit, such as an apple or orange; ?? of a banana; ?? cup of chopped, cooked, or canned fruit; ?? cup of fruit juice; 1 cup of melon or raspberries; and 2 tablespoons of dried fruit. These have about 15 grams of carbohydrate in a serving.  ?? Dairy: 1 cup of nonfat or low-fat milk and 2/3 cup of plain yogurt. These have about 15 grams of carbohydrate in a serving.  ?? Protein foods: Beef, chicken, turkey, fish, eggs, tofu, cheese, cottage cheese, and peanut butter. A serving size of meat is 3 ounces, which is about the size of a deck of cards. Examples of meat substitute serving sizes (equal to 1 ounce of meat) are 1/4 cup of cottage cheese, 1 egg, 1 tablespoon of peanut butter, and ?? cup of tofu. These have very little or no carbohydrate per serving.  ?? Vegetables: Starchy vegetables such as ?? cup of cooked dried beans, peas, potatoes, or corn have about 15 grams of carbohydrate. Nonstarchy vegetables have very little carbohydrate, such as 1 cup of raw leafy vegetables (such as spinach), ?? cup of other vegetables (cooked or chopped), and 3/4 cup   of vegetable juice.  ?? Use the plate format to plan meals. It is a good, quick way to make sure that you have a balanced meal. It also helps you spread carbohydrate throughout the day. You divide your plate by types of foods. Put vegetables on half the plate, meat or meat substitutes on one-quarter of the plate, and a grain or starchy vegetable (such as brown rice or a potato) in the final quarter of the plate. To this you can add a small piece of fruit and 1 cup of milk or yogurt, depending on how much carbohydrate you are supposed to eat at a meal.  ?? Talk to your dietitian or diabetes educator about ways to add limited amounts of sweets into your meal plan. You can eat these  foods now and then, as long as you include the amount of carbohydrate they have in your daily carbohydrate allowance.  ?? If you drink alcohol, limit it to no more than 1 drink a day for women and 2 drinks a day for men. If you are pregnant, no amount of alcohol is known to be safe.  ?? Protein, fat, and fiber do not raise blood sugar as much as carbohydrate does. If you eat a lot of these nutrients in a meal, your blood sugar will rise more slowly than it would otherwise.  ?? Limit saturated fats, such as those from meat and dairy products. Try to replace it with monounsaturated fat, such as olive oil. This is a healthier choice because people who have diabetes are at higher-than-average risk of heart disease. But use a modest amount of olive oil. A tablespoon of olive oil has 14 grams of fat and 120 calories.  ?? Exercise lowers blood sugar. If you take insulin by shots or pump, you can use less than you would if you were not exercising. Keep in mind that timing matters. If you exercise within 1 hour after a meal, your body may need less insulin for that meal than it would if you exercised 3 hours after the meal. Test your blood sugar to find out how exercise affects your need for insulin.  ?? Exercise on most days of the week. Aim for at least 30 minutes. Exercise helps you stay at a healthy weight and helps your body use insulin. Walking is an easy way to get exercise. Gradually increase the amount you walk every day. You also may want to swim, bike, or do other activities.  When you eat out  ?? Learn to estimate the serving sizes of foods that have carbohydrate. If you measure food at home, it will be easier to estimate the amount in a serving of restaurant food.  ?? If the meal you order has too much carbohydrate (such as potatoes, corn, or baked beans), ask to have a low-carbohydrate food instead. Ask for a salad or green vegetables.  ?? If you use insulin, check your blood sugar before and after eating out to help  you plan how much to eat in the future.  ?? If you eat more carbohydrate at a meal than you had planned, take a walk or do other exercise. This will help lower your blood sugar.   Where can you learn more?   Go to http://www.healthwise.net/[facility]  Enter I183 in the search box to learn more about "Nutrition Tips for Diabetes: After Your Visit."   ?? 2006-2014 Health[employer]. Care instructions adapted under license by [place] (which disclaims liability or warranty for this   information). This care instruction is for use with your licensed healthcare professional. If you have questions about a medical condition or this instruction, always ask your healthcare professional. Healthwise, Incorporated disclaims any warranty or liability for your use of this information.  Content Version: 10.2.346038; Current as of: August 07, 2012              Learning About Diabetes Food Guidelines  Your Care Instructions  Meal planning is important to manage diabetes. It helps keep your blood sugar at a target level (which you set with your doctor). You don't have to eat special foods. You can eat what your family eats, including sweets once in a while. But you do have to pay attention to how often you eat and how much you eat of certain foods.  You may want to work with a dietitian or a certified diabetes educator (CDE) to help you plan meals and snacks. A dietitian or CDE can also help you lose weight if that is one of your goals.  What should you know about eating carbs?  Managing the amount of carbohydrate (carbs) you eat is an important part of healthy meals when you have diabetes. Carbohydrate is found in many foods.  ?? Learn which foods have carbs. And learn the amounts of carbs in different foods.  ?? Bread, cereal, pasta, and rice have about 15 grams of carbs in a serving. A serving is 1 slice of bread (1 ounce), ?? cup of cooked cereal, or 1/3 cup of cooked pasta or rice.  ?? Fruits have 15 grams of carbs in a serving.  A serving is 1 small fresh fruit, such as an apple or orange; ?? of a banana; ?? cup of cooked or canned fruit; ?? cup of fruit juice; 1 cup of melon or raspberries; or 2 tablespoons of dried fruit.  ?? Milk and no-sugar-added yogurt have 15 grams of carbs in a serving. A serving is 1 cup of milk or 2/3 cup of no-sugar-added yogurt.  ?? Starchy vegetables have 15 grams of carbs in a serving. A serving is ?? cup of mashed potatoes or sweet potato; 1 cup winter squash; ?? of a small baked potato; ?? cup of cooked dried beans; or ?? cup cooked corn or green peas.  ?? Learn how much carbs to eat each day and at each meal. A dietitian or CDE can teach you how to keep track of the amount of carbs you eat. This is called carbohydrate counting.  ?? If you are not sure how to count carbohydrate grams, use the Plate Method to plan meals. It is a good, quick way to make sure that you have a balanced meal. It also helps you spread carbs throughout the day.  ?? Divide your plate by types of foods. Put non-starchy vegetables on half the plate, meat or other protein food on one-quarter of the plate, and a grain or starchy vegetable in the final quarter of the plate. To this you can add a small piece of fruit and 1 cup of milk or yogurt, depending on how many carbs you are supposed to eat at a meal.  ?? Try to eat about the same amount of carbs at each meal. Do not "save up" your daily allowance of carbs to eat at one meal.  ?? Proteins have very little or no carbs per serving. Examples of proteins are beef, chicken, turkey, fish, eggs, tofu, cheese, cottage cheese, and peanut butter. A serving size of meat   is 3 ounces, which is about the size of a deck of cards. Examples of meat substitute serving sizes (equal to 1 ounce of meat) are 1/4 cup of cottage cheese, 1 egg, 1 tablespoon of peanut butter, and ?? cup of tofu.  How can you eat out and still eat healthy?  ?? Learn to estimate the serving sizes of foods that have carbohydrate. If you measure  food at home, it will be easier to estimate the amount in a serving of restaurant food.  ?? If the meal you order has too much carbohydrate (such as potatoes, corn, or baked beans), ask to have a low-carbohydrate food instead. Ask for a salad or green vegetables.  ?? If you use insulin, check your blood sugar before and after eating out to help you plan how much to eat in the future.  ?? If you eat more carbohydrate at a meal than you had planned, take a walk or do other exercise. This will help lower your blood sugar.  What else should you know?  ?? Limit saturated fat, such as the fat from meat and dairy products. This is a healthy choice because people who have diabetes are at higher risk of heart disease. So choose lean cuts of meat and nonfat or low-fat dairy products. Use olive or canola oil instead of butter or shortening when cooking.  ?? Don't skip meals. Your blood sugar may drop too low if you skip meals and take certain diabetes pills or insulin.  ?? Check with your doctor before you drink alcohol. Alcohol can cause your blood sugar to drop too low. Alcohol can also cause a bad reaction if you take certain diabetes pills.  Follow-up care is a key part of your treatment and safety. Be sure to make and go to all appointments, and call your doctor if you are having problems. It's also a good idea to know your test results and keep a list of the medicines you take.   Where can you learn more?   Go to http://www.healthwise.net/[facility]  Enter I147 in the search box to learn more about "Learning About Diabetes Food Guidelines."   ?? 2006-2015 Healthwise, Incorporated. Care instructions adapted under license by [place] (which disclaims liability or warranty for this information). This care instruction is for use with your licensed healthcare professional. If you have questions about a medical condition or this instruction, always ask your healthcare professional. Healthwise, Incorporated disclaims any warranty  or liability for your use of this information.  Content Version: 10.4.390249; Current as of: August 07, 2012

## 2013-08-20 NOTE — Progress Notes (Addendum)
Chief Complaint   Patient presents with   ??? Medication Evaluation     f/u on meds     Patient complaint sharp pain in abdomen after urination goes away and comes back at times.    Order placed for HBG A1C per Verbal Order from Dr. Demetrios IsaacsAshrafi on 08/20/2013 due to DM.

## 2013-08-20 NOTE — Progress Notes (Signed)
HISTORY OF PRESENT ILLNESS  Allison Newman is a 73 y.o. female.  HPI   DMtype II    Compliant w/ meds, diabetic diet, and daily exercise, obtains home glucose monitoring averaging 100  . No Rf needed for today. Denies any tingling sensation, polyurea and polydipsia, last a1c was 6.5%   . Last podiatry visit   And last eye exam was 2014      Last urine microalbumin normal   . Feeling better since the last visit.    Chronic pain syndrome  Started few yrs  Ago, states that the meds given  help and make the pt capable and  able to do the activity of her daily living, not worsening, tried otc meds not helping, no swelling,  8  /10 w/out med, + abdominal upset from NSAIDS, no recent xray nor physical tx, pain persist without medication, it is a chronic condition, keeps meds at a safe place, states that she is compliant with stool softner to prevent constipation  HTN  Compliant w/ meds,haviong the low salt diet, and no daily walking, non smoker, no legs swelling no lightheadedness as per patient, not stressed out    Current Outpatient Prescriptions   Medication Sig Dispense Refill   ??? gabapentin (NEURONTIN) 300 mg capsule take 1 capsule by mouth four times a day 120 Cap 2   ??? diclofenac EC (VOLTAREN) 75 mg EC tablet take 1 tablet by mouth twice a day with food 30 Tab 3   ??? amLODIPine (NORVASC) 10 mg tablet take 1 tablet by mouth once daily 30 Tab 7   ??? sitaGLIPtin-metFORMIN (JANUMET) 50-1,000 mg per tablet Take 1 Tab by mouth two (2) times daily (with meals). Take one tablet in am and 0.5 tablet in evening.     ??? ergocalciferol (ERGOCALCIFEROL) 50,000 unit capsule   0   ??? FREESTYLE LANCETS 28 gauge misc   1   ??? omeprazole (PRILOSEC) 20 mg capsule   0   ??? methimazole (TAPAZOLE) 5 mg tablet Take  by mouth daily.     ??? glucose blood VI test strips (FREESTYLE LITE STRIPS) strip Test twice daily. 1 Package 11   ??? Lancets (FREESTYLE LANCETS) misc Test blood sugar 4 times a day 1 Package 11   ??? cyanocobalamin 1,000 mcg tablet  Take 1,000 mcg by mouth daily.     ??? JANUMET 50-1,000 mg per tablet take 1 tablet by mouth twice a day with food 60 tablet 3   ??? Omeprazole delayed release (PRILOSEC D/R) 20 mg tablet Take 1 tablet by mouth two (2) times a day. 30 min before meal twice daily 60 tablet 6   ??? metoprolol (LOPRESSOR) 50 mg tablet take 1 tablet by mouth twice a day 60 tablet 7   ??? COMBIVENT RESPIMAT 20-100 mcg/actuation inhaler inhale 1 puff by mouth every 6 hours 1 Inhaler 11   ??? triamcinolone acetonide (KENALOG) 0.1 % topical cream Apply  to affected area two (2) times a day. use thin layer 45 g 1   ??? simethicone 125 mg chewable tablet Take 125 mg by mouth every six (6) hours as needed (gas). 60 Tab 5   ??? sitaGLIPtin-metFORMIN (JANUMET) 50-1,000 mg per tablet take 1 tablet by mouth twice a day with food 60 Tab 3   ??? triamterene-hydrochlorothiazide (DYAZIDE) 37.5-25 mg per capsule Take 1 Cap by mouth daily. 30 Cap 3   ??? ipratropium-albuterol (COMBIVENT RESPIMAT) 20-100 mcg/actuation inhaler Take 1 Puff by inhalation every six (6) hours.  1 Inhaler 6   ??? aspirin 81 mg tablet Take 81 mg by mouth.     ??? HYDROcodone-acetaminophen (NORCO) 7.5-325 mg per tablet Take 1 Tab by mouth every eight (8) hours as needed for Pain. 90 Tab 0   ??? Cholecalciferol, Vitamin D3, 50,000 unit cap Take  by mouth.     ??? gabapentin (NEURONTIN) 100 mg capsule Take 1 capsule by mouth three (3) times daily. 90 capsule 3     No Known Allergies  Past Medical History   Diagnosis Date   ??? Hypertension    ??? Diabetes (HCC)    ??? Arthritis    ??? Gastrointestinal disorder      GERD   ??? 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 Elevated BUN 09/13/2011   ??? Rash, skin 09/25/2012   ??? 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 Chronic right hip pain 03/17/2013   ??? Gallbladder polyp 04/08/2013   ??? Umbilical hernia 05/08/2013   ??? Abdominal pain 05/08/2013   ??? Hypercholesterolemia    ??? GERD (gastroesophageal  reflux disease)    ??? 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     Past Surgical History   Procedure Laterality Date   ??? 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 Hx orthopaedic  2000     screw in foot     Family History   Problem Relation Age of Onset   ??? Diabetes Mother    ??? Heart Disease Mother    ??? Cancer Father      lung   ??? Cancer Sister      colon & breast   ??? Cancer Brother      throat     History   Substance Use Topics   ??? Smoking status: Former Smoker -- 0.00 packs/day     Quit date: 06/03/1991   ??? Smokeless tobacco: Not on file   ??? Alcohol Use: No      Lab Results  Component Value Date/Time   WBC 6.2 12/04/2012 05:35 PM   HGB 12.1 12/04/2012 05:35 PM   HCT 36.8 12/04/2012 05:35 PM   PLATELET 140 12/04/2012 05:35 PM   MCV 88.0 12/04/2012 05:35 PM     Lab Results  Component Value Date/Time   HEMOGLOBIN A1C 6.5 08/28/2012 10:56 AM   HEMOGLOBIN A1C 6.4 05/15/2012 09:02 AM   HEMOGLOBIN A1C 6.2 01/01/2012 08:45 AM   GLUCOSE 166 12/04/2012 05:35 PM   GLUCOSE (POC) 143 01/03/2008 09:21 AM   MICROALB/CREAT RATIO (UG/MG CREAT.) 3.4 08/28/2012 10:57 AM   LDL, CALCULATED 91 08/28/2012 10:56 AM   CREATININE (POC) 0.8 04/11/2013 11:28 AM   CREATININE 1.00 12/04/2012 05:35 PM      Lab Results  Component Value Date/Time   CHOLESTEROL, TOTAL 171 08/28/2012 10:56 AM   HDL CHOLESTEROL 62 08/28/2012 10:56 AM   LDL, CALCULATED 91 08/28/2012 10:56 AM   TRIGLYCERIDE 90 08/28/2012 10:56 AM     Lab Results  Component Value Date/Time   GFR EST AA >60 12/04/2012 05:35 PM   GFR EST NON-AA 55 12/04/2012 05:35 PM   CREATININE (POC) 0.8 04/11/2013 11:28 AM   CREATININE 1.00 12/04/2012 05:35 PM   BUN 26 12/04/2012 05:35 PM   SODIUM 147 12/04/2012 05:35 PM   POTASSIUM 4.2 12/04/2012 05:35 PM   CHLORIDE 108 12/04/2012 05:35 PM   CO2 27 12/04/2012 05:35 PM  Lab Results  Component Value Date/Time   TSH 0.397 08/28/2012 10:56 AM   T4 8.9 06/27/2012 09:33 AM         Review of Systems   Constitutional: Negative for fever and chills.   HENT:  Negative for ear pain and nosebleeds.    Eyes: Negative for blurred vision, pain and discharge.   Respiratory: Negative for shortness of breath.    Cardiovascular: Negative for chest pain and leg swelling.   Gastrointestinal: Negative for nausea, vomiting, diarrhea and constipation.   Genitourinary: Negative for frequency.   Musculoskeletal: Positive for back pain and joint pain.   Skin: Negative for itching and rash.   Neurological: Negative for headaches.   Psychiatric/Behavioral: Negative for depression. The patient is not nervous/anxious.        Physical Exam   Constitutional: She is oriented to person, place, and time. She appears well-developed and well-nourished.   HENT:   Head: Normocephalic and atraumatic.   Eyes: Conjunctivae and EOM are normal.   Neck: Normal range of motion. Neck supple.   Cardiovascular: Normal rate, regular rhythm and normal heart sounds.    No murmur heard.  Pulmonary/Chest: Effort normal and breath sounds normal.   Abdominal: Soft. Bowel sounds are normal. She exhibits no distension.   Musculoskeletal: She exhibits tenderness.        Right knee: She exhibits decreased range of motion. Tenderness found.        Left knee: She exhibits decreased range of motion. Tenderness found.        Lumbar back: She exhibits decreased range of motion, tenderness, pain and spasm.   Lymphadenopathy:     She has no cervical adenopathy.   Neurological: She is alert and oriented to person, place, and time.   Skin: No erythema.   Psychiatric: Her behavior is normal.   Nursing note and vitals reviewed.      ASSESSMENT and PLAN  Allison Newman was seen today for medication evaluation.    Diagnoses and associated orders for this visit:    Diabetes mellitus type II, uncontrolled (HCC)  - MICROALBUMIN:CREATININE RATIO, RANDOM URINE  - CBC W/O DIFF  - TSH, 3RD GENERATION  - LIPID PANEL  - METABOLIC PANEL, COMPREHENSIVE  - VITAMIN D, 25 HYDROXY  - AMB POC HEMOGLOBIN A1C  - REFERRAL TO OPHTHALMOLOGY  - REFERRAL TO PODIATRY   - sitaGLIPtin-metFORMIN (JANUMET) 50-1,000 mg per tablet; Take 1 Tab by mouth two (2) times daily (with meals). Take half tablet in am and half tablet in evening.    HTN, goal below 130/80  - MICROALBUMIN:CREATININE RATIO, RANDOM URINE  - CBC W/O DIFF  - TSH, 3RD GENERATION  - LIPID PANEL  - METABOLIC PANEL, COMPREHENSIVE  - VITAMIN D, 25 HYDROXY  - AMB POC HEMOGLOBIN A1C  - triamterene-hydrochlorothiazide (DYAZIDE) 37.5-25 mg per capsule; Take 1 Cap by mouth daily.  - amLODIPine (NORVASC) 10 mg tablet; take 1/2 tablet by mouth once daily    Hypercholesterolemia    Chronic right hip pain  - Discontinue: HYDROcodone-acetaminophen (NORCO) 7.5-325 mg per tablet; Take 1 Tab by mouth every eight (8) hours as needed for Pain.  - HYDROcodone-acetaminophen (NORCO) 7.5-325 mg per tablet; Take 1 Tab by mouth every eight (8) hours as needed for Pain.        lab results and schedule of future lab studies reviewed with patient  reviewed diet, exercise and weight control  cardiovascular risk and specific lipid/LDL goals reviewed  reviewed medications and side effects in detail  use of aspirin to prevent MI and TIA's discussed

## 2013-08-21 LAB — METABOLIC PANEL, COMPREHENSIVE
A-G Ratio: 1.3 (ref 1.1–2.5)
ALT (SGPT): 9 IU/L (ref 0–32)
AST (SGOT): 12 IU/L (ref 0–40)
Albumin: 4 g/dL (ref 3.5–4.8)
Alk. phosphatase: 66 IU/L (ref 39–117)
BUN/Creatinine ratio: 33 — ABNORMAL HIGH (ref 11–26)
BUN: 29 mg/dL — ABNORMAL HIGH (ref 8–27)
Bilirubin, total: 0.4 mg/dL (ref 0.0–1.2)
CO2: 22 mmol/L (ref 18–29)
Calcium: 9.3 mg/dL (ref 8.7–10.3)
Chloride: 102 mmol/L (ref 97–108)
Creatinine: 0.87 mg/dL (ref 0.57–1.00)
GFR est AA: 78 mL/min/{1.73_m2} (ref >59)
GFR est non-AA: 67 mL/min/{1.73_m2} (ref >59)
GLOBULIN, TOTAL: 3 g/dL (ref 1.5–4.5)
Glucose: 142 mg/dL — ABNORMAL HIGH (ref 65–99)
Potassium: 4.1 mmol/L (ref 3.5–5.2)
Protein, total: 7 g/dL (ref 6.0–8.5)
Sodium: 144 mmol/L (ref 134–144)

## 2013-08-21 LAB — CBC W/O DIFF
HCT: 40.8 % (ref 34.0–46.6)
HGB: 12.8 g/dL (ref 11.1–15.9)
MCH: 28.6 pg (ref 26.6–33.0)
MCHC: 31.4 g/dL — ABNORMAL LOW (ref 31.5–35.7)
MCV: 91 fL (ref 79–97)
PLATELET: 182 10*3/uL (ref 150–379)
RBC: 4.48 x10E6/uL (ref 3.77–5.28)
RDW: 14.1 % (ref 12.3–15.4)
WBC: 5.6 10*3/uL (ref 3.4–10.8)

## 2013-08-21 LAB — LIPID PANEL
Cholesterol, total: 180 mg/dL (ref 100–199)
HDL Cholesterol: 57 mg/dL (ref >39)
LDL, calculated: 107 mg/dL — ABNORMAL HIGH (ref 0–99)
Triglyceride: 82 mg/dL (ref 0–149)
VLDL, calculated: 16 mg/dL (ref 5–40)

## 2013-08-21 LAB — MICROALBUMIN:CREATININE RATIO, RANDOM URINE
Creatinine, urine random: 385.2 mg/dL — ABNORMAL HIGH (ref 15.0–278.0)
Microalb/Creat ratio (ug/mg creat.): 8 mg/g creat (ref 0.0–30.0)
Microalbumin, urine: 30.7 ug/mL — ABNORMAL HIGH (ref 0.0–17.0)

## 2013-08-21 LAB — TSH 3RD GENERATION: TSH: 1.66 u[IU]/mL (ref 0.450–4.500)

## 2013-08-21 LAB — CVD REPORT

## 2013-08-21 LAB — VITAMIN D, 25 HYDROXY: VITAMIN D, 25-HYDROXY: 30.1 ng/mL (ref 30.0–100.0)

## 2013-08-28 NOTE — Telephone Encounter (Signed)
Patient would like latest lab results     Best number to reach her is [phone] or [phone]

## 2013-08-28 NOTE — Telephone Encounter (Signed)
Message sent to Dr Demetrios IsaacsaShrafi

## 2013-08-29 ENCOUNTER — Encounter

## 2013-08-29 MED ORDER — LISINOPRIL 10 MG TAB
10 mg | ORAL_TABLET | Freq: Every day | ORAL | Status: DC
Start: 2013-08-29 — End: 2014-03-17

## 2013-08-29 NOTE — Telephone Encounter (Signed)
Phoned patient message given she acknowledged understanding.

## 2013-08-29 NOTE — Telephone Encounter (Signed)
Please let him know that his kiney loosing protein secondary to the diabetic state, his amlodipine will be discont and will be started on lisinopril the change is at his pharmacy f/u on his next visit, in addition he needs to cont taking his vitamin-D,  His level is very low otherwise nl results

## 2013-09-01 MED ORDER — METOPROLOL TARTRATE 50 MG TAB
50 mg | ORAL_TABLET | ORAL | Status: DC
Start: 2013-09-01 — End: 2014-04-16

## 2013-09-24 ENCOUNTER — Encounter

## 2013-10-23 NOTE — Telephone Encounter (Signed)
Returned call to pt.  Requesting med refill appt for norco.  appt scheduled for 10/27/13@730 

## 2013-10-23 NOTE — Telephone Encounter (Signed)
Patient is requesting to be seen ASAP for med refills. Offered patient next available in September but patient stated she couldn't wait that long. Please call back to schedule.    Best # to contact patient: [phone] (H)  [phone] (M)

## 2013-10-27 MED ORDER — SIMVASTATIN 40 MG TAB
40 mg | ORAL_TABLET | Freq: Every evening | ORAL | Status: DC
Start: 2013-10-27 — End: 2013-11-11

## 2013-10-27 MED ORDER — HYDROCODONE-ACETAMINOPHEN 7.5 MG-325 MG TAB
ORAL_TABLET | Freq: Three times a day (TID) | ORAL | Status: DC | PRN
Start: 2013-10-27 — End: 2014-01-22

## 2013-10-27 MED ORDER — HYDROCODONE-ACETAMINOPHEN 7.5 MG-325 MG TAB
ORAL_TABLET | Freq: Three times a day (TID) | ORAL | Status: DC | PRN
Start: 2013-10-27 — End: 2013-11-11

## 2013-10-27 NOTE — Progress Notes (Signed)
HISTORY OF PRESENT ILLNESS  Allison Newman is a 73 y.o. female.  HPI     Chronic pain syndrome  It has been an ongoing problem for the patient which Started few yrs ago, th patient states that the current meds given, helping the pt greatly regardless of the side effects which the pt has not experienced yet. In addition the current meds is making the pt capable of doing things specially the activity of the pt daily living, in addition to improve the quality of the pt's life, pt states that with the current meds the pain is not worsening. The pt also tried otc meds but they did not help and created the + abdominal upset from NSAIDS, pt currently pain level is at 8 /10 w/out med, and states that the pain persist without medication. 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 metoprolol (LOPRESSOR) 50 mg tablet take 1 tablet by mouth twice a day 60 Tab 7   ??? lisinopril (PRINIVIL, ZESTRIL) 10 mg tablet Take 1 Tab by mouth daily. 30 Tab 6   ??? triamterene-hydrochlorothiazide (DYAZIDE) 37.5-25 mg per capsule Take 1 Cap by mouth daily. 30 Cap 3   ??? sitaGLIPtin-metFORMIN (JANUMET) 50-1,000 mg per tablet Take 1 Tab by mouth two (2) times daily (with meals). Take half tablet in am and half tablet in evening. 60 Tab 6   ??? HYDROcodone-acetaminophen (NORCO) 7.5-325 mg per tablet Take 1 Tab by mouth every eight (8) hours as needed for Pain. 90 Tab 0    ??? gabapentin (NEURONTIN) 300 mg capsule take 1 capsule by mouth four times a day 120 Cap 2   ??? diclofenac EC (VOLTAREN) 75 mg EC tablet take 1 tablet by mouth twice a day with food 30 Tab 3   ??? ergocalciferol (ERGOCALCIFEROL) 50,000 unit capsule   0   ??? FREESTYLE LANCETS 28 gauge misc   1   ??? omeprazole (PRILOSEC) 20 mg capsule   0   ??? methimazole (TAPAZOLE) 5 mg tablet Take  by mouth daily.     ??? glucose blood VI test strips (FREESTYLE LITE STRIPS) strip Test twice daily. 1 Package 11   ??? Lancets (FREESTYLE LANCETS) misc Test blood sugar 4 times a day 1 Package 11   ??? cyanocobalamin 1,000 mcg tablet Take 1,000 mcg by mouth daily.     ??? Cholecalciferol, Vitamin D3, 50,000 unit cap Take  by mouth.     ??? JANUMET 50-1,000 mg per tablet take 1 tablet by mouth twice a day with food 60 tablet 3   ??? Omeprazole delayed release (PRILOSEC D/R) 20 mg tablet Take 1 tablet by mouth two (2) times a day. 30 min before meal twice daily 60 tablet 6   ??? COMBIVENT RESPIMAT 20-100 mcg/actuation inhaler inhale 1 puff by mouth every 6 hours 1 Inhaler 11   ??? gabapentin (NEURONTIN) 100 mg capsule  Take 1 capsule by mouth three (3) times daily. 90 capsule 3   ??? triamcinolone acetonide (KENALOG) 0.1 % topical cream Apply  to affected area two (2) times a day. use thin layer 45 g 1   ??? simethicone 125 mg chewable tablet Take 125 mg by mouth every six (6) hours as needed (gas). 60 Tab 5   ??? ipratropium-albuterol (COMBIVENT RESPIMAT) 20-100 mcg/actuation inhaler Take 1 Puff by inhalation every six (6) hours. 1 Inhaler 6   ??? aspirin 81 mg tablet Take 81 mg by mouth.       No Known Allergies  Past Medical History   Diagnosis Date   ??? Hypertension    ??? Diabetes ([place])    ??? Arthritis    ??? Gastrointestinal disorder      GERD   ??? 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, adult ([place]) 08/28/2011   ??? 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 Elevated BUN 09/13/2011   ??? Rash, skin 09/25/2012   ??? 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 Chronic right hip pain 03/17/2013   ??? Gallbladder polyp 04/08/2013   ??? Umbilical hernia 05/05/9516   ??? Abdominal pain 05/08/2013   ??? Hypercholesterolemia    ??? GERD (gastroesophageal reflux disease)    ??? 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 Microalbuminuria 08/29/2013     Past Surgical History   Procedure Laterality Date   ??? 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 Hx orthopaedic  2000     screw in foot     Family History   Problem Relation Age of Onset   ??? Diabetes Mother    ??? Heart Disease Mother    ??? Cancer Father      lung   ??? Cancer Sister      colon & breast   ??? Cancer Brother      throat     History   Substance Use Topics   ??? Smoking status: Former Smoker -- 0.00 packs/day     Quit date: 06/03/1991   ??? Smokeless tobacco: Not on file   ??? Alcohol Use: No      Lab Results  Component Value Date/Time   WBC 5.6 08/20/2013 08:45 AM   HGB 12.8 08/20/2013 08:45 AM   HCT 40.8 08/20/2013 08:45 AM   PLATELET 182 08/20/2013 08:45 AM   MCV 91 08/20/2013 08:45 AM     Lab Results  Component Value Date/Time   HEMOGLOBIN A1C 6.5 08/28/2012 10:56 AM   HEMOGLOBIN A1C 6.4 05/15/2012 09:02 AM   HEMOGLOBIN A1C 6.2 01/01/2012 08:45 AM   GLUCOSE 142 08/20/2013 08:45 AM   GLUCOSE (POC) 143 01/03/2008 09:21 AM   MICROALB/CREAT RATIO (UG/MG CREAT.) 8.0 08/20/2013 08:45 AM   LDL, CALCULATED 107 08/20/2013 08:45 AM   CREATININE (POC) 0.8 04/11/2013 11:28 AM   CREATININE 0.87 08/20/2013 08:45 AM      Lab Results  Component Value Date/Time   CHOLESTEROL, TOTAL 180 08/20/2013 08:45 AM   HDL CHOLESTEROL 57 08/20/2013 08:45 AM   LDL, CALCULATED 107 08/20/2013 08:45 AM   TRIGLYCERIDE 82 08/20/2013 08:45 AM     Lab Results  Component Value Date/Time   ALT 9 08/20/2013 08:45 AM   AST 12 08/20/2013 08:45 AM   ALK. PHOSPHATASE 66 08/20/2013 08:45 AM   BILIRUBIN, DIRECT 0.1 12/04/2012 05:35 PM   BILIRUBIN, TOTAL 0.4 08/20/2013 08:45 AM     Lab Results  Component Value  Date/Time   GFR EST AA 78 08/20/2013 08:45 AM    GFR EST NON-AA 67 08/20/2013 08:45 AM   CREATININE (POC) 0.8 04/11/2013 11:28 AM   CREATININE 0.87 08/20/2013 08:45 AM   BUN 29 08/20/2013 08:45 AM   SODIUM 144 08/20/2013 08:45 AM   POTASSIUM 4.1 08/20/2013 08:45 AM   CHLORIDE 102 08/20/2013 08:45 AM   CO2 22 08/20/2013 08:45 AM      Lab Results  Component Value Date/Time   TSH 1.660 08/20/2013 08:45 AM   T4 8.9 06/27/2012 09:33 AM         Review of Systems   Constitutional: Negative for fever and chills.   HENT: Negative for ear pain and nosebleeds.    Eyes: Negative for blurred vision, pain and discharge.   Respiratory: Negative for shortness of breath.    Cardiovascular: Negative for chest pain and leg swelling.   Gastrointestinal: Negative for nausea, vomiting, diarrhea and constipation.   Genitourinary: Negative for frequency.   Musculoskeletal: Positive for back pain, joint pain and neck pain.   Skin: Negative for itching and rash.   Neurological: Negative for headaches.   Psychiatric/Behavioral: Negative for depression. The patient is not nervous/anxious.        Physical Exam   Constitutional: She is oriented to person, place, and time. She appears well-developed and well-nourished.   HENT:   Head: Normocephalic and atraumatic.   Eyes: Conjunctivae and EOM are normal.   Neck: Normal range of motion. Neck supple.   Cardiovascular: Normal rate, regular rhythm and normal heart sounds.    No murmur heard.  Pulmonary/Chest: Effort normal and breath sounds normal.   Abdominal: Soft. Bowel sounds are normal. She exhibits no distension.   Musculoskeletal: Normal range of motion. She exhibits tenderness. She exhibits no edema.   Lymphadenopathy:     She has no cervical adenopathy.   Neurological: She is alert and oriented to person, place, and time.   Skin: No erythema.   Psychiatric: Her behavior is normal.   Nursing note and vitals reviewed.      ASSESSMENT and PLAN  Allison Newman was seen today for no specified reason.    Diagnoses and associated orders for this visit:     Chronic right hip pain  - HYDROcodone-acetaminophen (NORCO) 7.5-325 mg per tablet; Take 1 Tab by mouth every eight (8) hours as needed for Pain.    Other Orders  - HYDROcodone-acetaminophen (NORCO) 7.5-325 mg per tablet; Take 1 Tab by mouth every eight (8) hours as needed for Pain. Max Daily Amount: 3 Tabs.  - simvastatin (ZOCOR) 40 mg tablet; Take 0.5 Tabs by mouth nightly.      stretching x2 daily for 5-10 min, rom strengthening with resistance banding 3-4 times per week, avoid heavy lifting and pushing, avoid machinary operation and driving while on any medication that could potentially cause dizziness, help with weight reduction, take meds w/ food and water, stool softner if opioid base meds given, dependency and tolerancy were also addressed,  meds side effects and compliance advised, rtc if worsens.    lab results and schedule of future lab studies reviewed with patient  reviewed diet, exercise and weight control  cardiovascular risk and specific lipid/LDL goals reviewed  reviewed medications and side effects in detail  use of aspirin to prevent MI and TIA's discussed  radiology results and schedule of future radiology studies reviewed with patient

## 2013-11-09 MED ORDER — DICLOFENAC 75 MG TAB, DELAYED RELEASE
75 mg | ORAL_TABLET | ORAL | Status: DC
Start: 2013-11-09 — End: 2014-02-05

## 2013-11-11 LAB — CBC W/O DIFF
HCT: 38.4 % (ref 35.0–47.0)
HGB: 11.8 g/dL (ref 11.5–16.0)
MCH: 27.6 PG (ref 26.0–34.0)
MCHC: 30.7 g/dL (ref 30.0–36.5)
MCV: 89.7 FL (ref 80.0–99.0)
PLATELET: 139 10*3/uL — ABNORMAL LOW (ref 150–400)
RBC: 4.28 M/uL (ref 3.80–5.20)
RDW: 13.8 % (ref 11.5–14.5)
WBC: 5 10*3/uL (ref 3.6–11.0)

## 2013-11-11 LAB — EKG, 12 LEAD, INITIAL
Atrial Rate: 67 {beats}/min
Calculated P Axis: 28 degrees
Calculated R Axis: -12 degrees
Calculated T Axis: -16 degrees
Diagnosis: NORMAL
P-R Interval: 182 ms
Q-T Interval: 412 ms
QRS Duration: 84 ms
QTC Calculation (Bezet): 435 ms
Ventricular Rate: 67 {beats}/min

## 2013-11-11 LAB — METABOLIC PANEL, BASIC
Anion gap: 8 mmol/L (ref 5–15)
BUN/Creatinine ratio: 33 — ABNORMAL HIGH (ref 12–20)
BUN: 24 MG/DL — ABNORMAL HIGH (ref 6–20)
CO2: 28 mmol/L (ref 21–32)
Calcium: 8.5 MG/DL (ref 8.5–10.1)
Chloride: 106 mmol/L (ref 97–108)
Creatinine: 0.73 MG/DL (ref 0.45–1.15)
GFR est AA: >60 mL/min/{1.73_m2} (ref >60)
GFR est non-AA: >60 mL/min/{1.73_m2} (ref >60)
Glucose: 120 mg/dL — ABNORMAL HIGH (ref 65–100)
Potassium: 4.1 mmol/L (ref 3.5–5.1)
Sodium: 142 mmol/L (ref 136–145)

## 2013-11-11 NOTE — Other (Signed)
Faxed platelet level to Dr.Manetas office,fax confirmed.

## 2013-11-11 NOTE — Other (Signed)
Endoscopy Pre-Operative Instructions      Procedure Date: ____09/15/15______________ Time of Arrival:_______1100_____________        1. On the day of your procedure, please report to the OP Registration Desk, located in [facility] and sign in at your designated time.    2. You must have someone with you to drive you home. The responsible party    must stay at the facility during your procedure. Your test will be rescheduled     if there is no one to stay the entire time. You should not drive a car for 24 hours     following your procedure.    3. Do not have anything to eat or drink after 12 midnight, the night before surgery.    4 We recommend you do not drink any alcoholic beverages for 24 hours before     and after your procedure.    5. Wear comfortable clothes. Wear you glasses instead of contacts. Do not bring     money or jewelry.    6. You should understand that if you do not follow the instructions, your    procedure may be cancelled. If your physical condition changes, (i.e. fever, cold,     flu) please contact your physician as soon as possible.    7. It is important that you are on time. If a situation occurs where you may be late,     please call the Endoscopy department at [phone].    8. If you have any questions and/or problems, please call:   ____________________________.    SPECIAL INSTRUCTIONS:    ________________________________________________________________________________________________________________________________________________________________________________________________________________________    _adl____ Patient verbalized understanding of all indicated instructions  _____ Needs reinforcement    I have read the above statements or these statements have been verbally communicated to me. I understand the above instructions and my questions have been answered.      ___angela long rn________________________   ___________________________   Pre-Op Instructions Provided By   Patient Signature           Date: _____09/08/15__________________

## 2013-11-12 NOTE — Other (Signed)
Dr. Alvina Filbert rviewed ekg and cleared for surgery.

## 2013-11-13 NOTE — Telephone Encounter (Signed)
Received call from [facility] with Dr Jeneen Montgomery office.  Pt seen in their office today for pre-op clearance for 11/18/13  EGD/colonoscopy.  ekg was abnormal.  Needs follow up with pcp prior to scheduled procedure.  appt scheduled for 8:15am 11/17/13. Burnett Harry will notify patient of appt

## 2013-11-17 NOTE — Progress Notes (Addendum)
Chief Complaint   Patient presents with   ??? Abnormal EKG     Needs clearance for Colonoscopy     Patient decline Flu vaccine. Patient stated Cardiologist recommended follow up with PCP.

## 2013-11-17 NOTE — Progress Notes (Signed)
HISTORY OF PRESENT ILLNESS  Allison Newman is a 73 y.o. female. Pt states that she was supposed to get upper and lower Endoscopy, pre-procedural EKG was done on her was found to be abnl was sent to cardiologist and her abnl was supported stating that because all , her endoscopies could be canceled, pt has pmhx but all stable at this time including her HTN, DMII, none smoker but morbid obese at this time,   HPI   DMtype II    Compliant w/ meds, diabetic diet, and daily exercise, obtains home glucose monitoring averaging 120's  . No Rf needed for today. Denies any tingling sensation, polyurea and polydipsia, last a1c was controlled in 6month ago at 6.5%%  . Last podiatry visit   And last eye exam was 2015    Last urine microalbumin elevated and currently on ACEI . Feeling better since the last visit.        Current Outpatient Prescriptions   Medication Sig Dispense Refill   ??? sitaGLIPtin-metFORMIN (JANUMET) 50-1,000 mg per tablet Take 0.5 Tabs by mouth two (2) times daily (with meals).     ??? simvastatin (ZOCOR) 20 mg tablet Take 20 mg by mouth every other day. Take 0.5 tab every other day.     ??? diclofenac EC (VOLTAREN) 75 mg EC tablet take 1 tablet by mouth twice a day with food 30 Tab 3   ??? [START ON 11/27/2013] HYDROcodone-acetaminophen (NORCO) 7.5-325 mg per tablet Take 1 Tab by mouth every eight (8) hours as needed for Pain. Max Daily Amount: 3 Tabs. 90 Tab 0   ??? metoprolol (LOPRESSOR) 50 mg tablet take 1 tablet by mouth twice a day 60 Tab 7   ??? lisinopril (PRINIVIL, ZESTRIL) 10 mg tablet Take 1 Tab by mouth daily. 30 Tab 6   ??? triamterene-hydrochlorothiazide (DYAZIDE) 37.5-25 mg per capsule Take 1 Cap by mouth daily. 30 Cap 3   ??? FREESTYLE LANCETS 28 gauge misc   1   ??? omeprazole (PRILOSEC) 20 mg capsule Take 20 mg by mouth daily.  0   ??? methimazole (TAPAZOLE) 5 mg tablet Take 5 mg by mouth daily.     ??? glucose blood VI test strips (FREESTYLE LITE STRIPS) strip Test twice daily. 1 Package 11    ??? Lancets (FREESTYLE LANCETS) misc Test blood sugar 4 times a day 1 Package 11   ??? cyanocobalamin 1,000 mcg tablet Take 1,000 mcg by mouth daily.     ??? Cholecalciferol, Vitamin D3, 50,000 unit cap Take 1 Tab by mouth every Sunday.     ??? COMBIVENT RESPIMAT 20-100 mcg/actuation inhaler inhale 1 puff by mouth every 6 hours 1 Inhaler 11   ??? aspirin 81 mg tablet Take 81 mg by mouth.       No Known Allergies  Past Medical History   Diagnosis Date   ??? Hypertension    ??? Diabetes [place]    ??? Arthritis    ??? Gastrointestinal disorder      GERD   ??? 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, adult ([place] 08/28/2011   ??? 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 Elevated BUN 09/13/2011   ??? Rash, skin 09/25/2012   ??? 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 Chronic right hip pain 03/17/2013   ??? Gallbladder polyp 04/08/2013   ??? Umbilical hernia 33/10/1827  ??? Abdominal pain 05/08/2013   ??? Hypercholesterolemia    ??? GERD (gastroesophageal reflux disease)    ??? Chronic pain  disorder 06/04/2013   ??? Microalbuminuria 08/29/2013   ??? Other unknown and unspecified cause of morbidity or mortality      hx of bronchitis   ??? Abnormal finding on EKG 11/17/2013     Past Surgical History   Procedure Laterality Date   ??? Hx orthopaedic  1998     knee replacement right   ??? Hx orthopaedic  2000     screw in foot left   ??? Hx other surgical       colonoscopy     Family History   Problem Relation Age of Onset   ??? Diabetes Mother    ??? Heart Disease Mother    ??? Cancer Father      lung   ??? Cancer Sister      colon & breast   ??? Cancer Brother      throat     History   Substance Use Topics   ??? Smoking status: Former Smoker -- 0.25 packs/day for 1 years     Quit date: 06/03/1991   ??? Smokeless tobacco: Never Used   ??? Alcohol Use: No      Lab Results  Component Value Date/Time   WBC 5.0 11/11/2013 01:51 PM   HGB 11.8 11/11/2013 01:51 PM   HCT 38.4 11/11/2013 01:51 PM   PLATELET 139 11/11/2013 01:51 PM   MCV 89.7 11/11/2013 01:51 PM     Lab Results   Component Value Date/Time   HEMOGLOBIN A1C 6.5 08/28/2012 10:56 AM   HEMOGLOBIN A1C 6.4 05/15/2012 09:02 AM   HEMOGLOBIN A1C 6.2 01/01/2012 08:45 AM   GLUCOSE 120 11/11/2013 01:51 PM   GLUCOSE (POC) 143 01/03/2008 09:21 AM   MICROALB/CREAT RATIO (UG/MG CREAT.) 8.0 08/20/2013 08:45 AM   LDL, CALCULATED 107 08/20/2013 08:45 AM   CREATININE (POC) 0.8 04/11/2013 11:28 AM   CREATININE 0.73 11/11/2013 01:51 PM      Lab Results  Component Value Date/Time   CHOLESTEROL, TOTAL 180 08/20/2013 08:45 AM   HDL CHOLESTEROL 57 08/20/2013 08:45 AM   LDL, CALCULATED 107 08/20/2013 08:45 AM   TRIGLYCERIDE 82 08/20/2013 08:45 AM     Lab Results  Component Value Date/Time   ALT 9 08/20/2013 08:45 AM   AST 12 08/20/2013 08:45 AM   ALK. PHOSPHATASE 66 08/20/2013 08:45 AM   BILIRUBIN, DIRECT 0.1 12/04/2012 05:35 PM   BILIRUBIN, TOTAL 0.4 08/20/2013 08:45 AM        Review of Systems   Constitutional: Negative for fever and chills.   HENT: Negative for ear pain and nosebleeds.    Eyes: Negative for blurred vision, pain and discharge.   Respiratory: Negative for shortness of breath.    Cardiovascular: Negative for chest pain and leg swelling.   Gastrointestinal: Negative for nausea, vomiting, diarrhea and constipation.   Genitourinary: Negative for frequency.   Musculoskeletal: Negative for joint pain.   Skin: Negative for itching and rash.   Neurological: Negative for headaches.   Psychiatric/Behavioral: Negative for depression. The patient is not nervous/anxious.        Physical Exam   Constitutional: She is oriented to person, place, and time. She appears well-developed and well-nourished.   HENT:   Head: Normocephalic and atraumatic.   Eyes: Conjunctivae and EOM are normal.   Neck: Normal range of motion. Neck supple.   Cardiovascular: Normal rate, regular rhythm and normal heart sounds.    No murmur heard.  Pulmonary/Chest: Effort normal and breath sounds normal.   Abdominal: Soft. Bowel sounds are  normal. She exhibits no distension.    Musculoskeletal: Normal range of motion. She exhibits no edema.   Lymphadenopathy:     She has no cervical adenopathy.   Neurological: She is alert and oriented to person, place, and time.   Skin: No erythema.   Psychiatric: Her behavior is normal.   Nursing note and vitals reviewed.      ASSESSMENT and PLAN  [place] was seen today for abnormal ekg.    Diagnoses and associated orders for this visit:    Abnormal finding on EKG, denies CP , Dyspnea at rest  - REFERRAL TO CARDIOLOGY    Diabetes mellitus type II, uncontrolled ([place])  - REFERRAL TO CARDIOLOGY  - HEMOGLOBIN A1C  lab results and schedule of future lab studies reviewed with patient  reviewed diet, exercise and weight control  very strongly urged to quit smoking to reduce cardiovascular risk  cardiovascular risk and specific lipid/LDL goals reviewed  reviewed medications and side effects in detail  use of aspirin to prevent MI and TIA's discussed

## 2013-11-18 LAB — HEMOGLOBIN A1C WITH EAG: Hemoglobin A1c: 6.6 % — ABNORMAL HIGH (ref 4.8–5.6)

## 2013-11-23 NOTE — Progress Notes (Signed)
Late Entry  Contacted patient for Diabetes disease process monitoring. DOB and address were the two patient identifiers used. Patient educated about complications of diabetes and importance of keeping medical appointments, getting eye and foot exams and diet control. Offered services of free dietician consults that are available at [facility] at no cost. Requested information regarding last eye exam, patient unsure of date but provided name of physician that perform eye exam. MD office called for report of last exam.  Received eye exam report from Dr. Claretha Cooper office. Patient's last exam was 08/27/13.

## 2013-11-25 MED ORDER — OMEPRAZOLE 20 MG CAP, DELAYED RELEASE
20 mg | ORAL_CAPSULE | ORAL | Status: DC
Start: 2013-11-25 — End: 2016-04-19

## 2013-12-04 ENCOUNTER — Ambulatory Visit
Admit: 2013-12-04 | Discharge: 2013-12-04 | Payer: MEDICARE | Attending: Cardiovascular Disease | Primary: Geriatric Medicine

## 2013-12-04 DIAGNOSIS — I10 Essential (primary) hypertension: Secondary | ICD-10-CM

## 2013-12-04 NOTE — Progress Notes (Signed)
Subjective/HPI:     Allison Newman is a 73 y.o. female is here for new patient consultation.  The patient has medical hx significant for :HTN, hyperlipidemia, DM, and chronic pain disorder. 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 Her procedure was canceled until cardiac evaluation. She reports that she has been having some fluid coming up and that is why they are doing her ECG. It was time for a routine colonoscopy. She denies CP/SOB, fatigue, orthopnea, PND, edema, dizziness, or lightheadedness, however she is not very active d/t chronic pain.Previous cardiac evaluation includes stress test 10+ years ago with cardiac cath that was neg for CAD. Family med hx significant for mother with rheumatic fever and subsequent heart complications.    Patient Active Problem List    Diagnosis Date Noted   ??? Abnormal finding on EKG 11/17/2013   ??? Microalbuminuria 08/29/2013   ??? 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 Umbilical hernia 51/88/4166   ??? Abdominal pain 05/08/2013   ??? Abdominal pain, chronic, right lower quadrant 04/08/2013   ??? Gallbladder polyp 04/08/2013   ??? Chronic right hip pain 03/17/2013   ??? Rash, skin 09/25/2012   ??? Lung nodule seen on imaging study 09/25/2012   ??? 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 Hypercholesterolemia 05/29/2012   ??? Acid reflux 01/15/2012   ??? Acute bronchitis 12/12/2011   ??? Elevated BUN 09/13/2011   ??? Low TSH level 09/13/2011   ??? Abnormal urine finding 09/13/2011   ??? HTN, goal below 130/80 08/28/2011   ??? Diabetes mellitus type II, uncontrolled ([place]) 08/28/2011   ??? 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, adult ([place]) 08/28/2011   ??? 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 Hypertension    ??? Diabetes ([place])    ??? Arthritis    ??? Gastrointestinal disorder      GERD    ??? 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, adult ([place]) 08/28/2011   ??? 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 Elevated BUN 09/13/2011   ??? Rash, skin 09/25/2012   ??? 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 Chronic right hip pain 03/17/2013   ??? Gallbladder polyp 04/08/2013   ??? Umbilical hernia 0/08/3014   ??? Abdominal pain 05/08/2013   ??? Hypercholesterolemia    ??? GERD (gastroesophageal reflux disease)    ??? 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 Microalbuminuria 08/29/2013   ??? Other unknown and unspecified cause of morbidity or mortality      hx of bronchitis   ??? Abnormal finding on EKG 11/17/2013      Past Surgical History   Procedure Laterality Date   ??? Hx orthopaedic  1998     knee replacement right   ??? Hx orthopaedic  2000     screw in foot left   ??? Hx other surgical       colonoscopy     No Known Allergies   Family History   Problem Relation Age of Onset   ??? Diabetes Mother    ??? Heart Disease Mother      rheumatic fever   ??? Cancer Father      lung   ??? Cancer Sister      colon & breast   ??? Cancer Brother      throat  Current Outpatient Prescriptions   Medication Sig   ??? omeprazole (PRILOSEC) 20 mg capsule take 1 capsule by mouth twice a day before meals   ??? sitaGLIPtin-metFORMIN (JANUMET) 50-1,000 mg per tablet Take 0.5 Tabs by mouth two (2) times daily (with meals).   ??? simvastatin (ZOCOR) 20 mg tablet Take 20 mg by mouth every other day. Take 0.5 tab every other day.   ??? diclofenac EC (VOLTAREN) 75 mg EC tablet take 1 tablet by mouth twice a day with food   ??? HYDROcodone-acetaminophen (NORCO) 7.5-325 mg per tablet Take 1 Tab by mouth every eight (8) hours as needed for Pain. Max Daily Amount: 3 Tabs.   ??? metoprolol (LOPRESSOR) 50 mg tablet take 1 tablet by mouth twice a day   ??? lisinopril (PRINIVIL, ZESTRIL) 10 mg tablet Take 1 Tab by mouth daily.   ??? triamterene-hydrochlorothiazide (DYAZIDE) 37.5-25 mg per capsule Take 1 Cap by mouth daily.   ??? FREESTYLE LANCETS 28 gauge misc     ??? omeprazole (PRILOSEC) 20 mg capsule Take 20 mg by mouth daily.   ??? methimazole (TAPAZOLE) 5 mg tablet Take 5 mg by mouth daily.   ??? glucose blood VI test strips (FREESTYLE LITE STRIPS) strip Test twice daily.   ??? Lancets (FREESTYLE LANCETS) misc Test blood sugar 4 times a day   ??? cyanocobalamin 1,000 mcg tablet Take 1,000 mcg by mouth daily.   ??? Cholecalciferol, Vitamin D3, 50,000 unit cap Take 1 Tab by mouth every Sunday.   ??? COMBIVENT RESPIMAT 20-100 mcg/actuation inhaler inhale 1 puff by mouth every 6 hours   ??? aspirin 81 mg tablet Take 81 mg by mouth.   ??? simvastatin (ZOCOR) 40 mg tablet      No current facility-administered medications for this visit.      Filed Vitals:    12/04/13 1600 12/04/13 1609   BP: 148/70 146/70   Pulse: 76    Height: 5' 4"  (1.626 m)    Weight: 347 lb 6.4 oz (157.58 kg)        I have reviewed the nurses notes, vitals, problem list, allergy list, medical history, family, social history and medications.    Review of Symptoms:    General: Pt denies excessive weight gain or loss. Pt is able to conduct ADL's  HEENT: Denies blurred vision, headaches, epistaxis and difficulty swallowing.  Respiratory: Denies shortness of breath, DOE, wheezing or stridor.  Cardiovascular: Denies precordial pain, palpitations, edema or PND  Gastrointestinal: Denies poor appetite,+ indigestion, denies abdominal pain or blood in stool  Musculoskeletal: Denies pain or swelling from muscles or joints  Neurologic: Denies tremor, paresthesias, or sensory motor disturbance  Skin: Denies rash, itching or texture change.      Physical Exam: ??    General: obese, cooperative, alert in no acute distress, appears states age.  HEENT: Supple, No carotid bruits, no JVD, trach is midline. PERRL, EOM intact  Heart: ??Normal S1/S2 negative S3 or S4. Regular, no murmur, gallop or rub.??  Respiratory: Clear bilaterally x 4, no wheezing or rales  Abdomen:?? ??Soft, non-tender, no masses, bowel sounds are active.??   Extremities:  No edema, normal cap refill, no cyanosis, atraumatic.  Neuro: A&Ox3, speech clear, gait stable.   Skin: Skin color is normal. No rashes or lesions. Non diaphoretic  Vascular: 2+ pulses symmetric in all extremities    Cardiographics    ECG: SR, inferior infarct    Results for orders placed or performed during the hospital encounter of 11/11/13  EKG, 12 LEAD, INITIAL   Result Value Ref Range    Ventricular Rate 67 BPM    Atrial Rate 67 BPM    P-R Interval 182 ms    QRS Duration 84 ms    Q-T Interval 412 ms    QTC Calculation (Bezet) 435 ms    Calculated P Axis 28 degrees    Calculated R Axis -12 degrees    Calculated T Axis -16 degrees    Diagnosis       Normal sinus rhythm  Cannot rule out Anterior infarct , age undetermined  No previous ECGs available  Confirmed by Cresencia Asmus, P.V. [phone]) on 11/11/2013 6:44:29 PM           Cardiology Labs:  Lab Results   Component Value Date/Time    CHOLESTEROL, TOTAL 180 08/20/2013 08:45 AM    HDL CHOLESTEROL 57 08/20/2013 08:45 AM    LDL, CALCULATED 107 08/20/2013 08:45 AM    TRIGLYCERIDE 82 08/20/2013 08:45 AM       Lab Results   Component Value Date/Time    SODIUM 142 11/11/2013 01:51 PM    POTASSIUM 4.1 11/11/2013 01:51 PM    CHLORIDE 106 11/11/2013 01:51 PM    CO2 28 11/11/2013 01:51 PM    ANION GAP 8 11/11/2013 01:51 PM    GLUCOSE 120 11/11/2013 01:51 PM    BUN 24 11/11/2013 01:51 PM    CREATININE 0.73 11/11/2013 01:51 PM    BUN/CREATININE RATIO 33 11/11/2013 01:51 PM    GFR EST AA >60 11/11/2013 01:51 PM    GFR EST NON-AA >60 11/11/2013 01:51 PM    CALCIUM 8.5 11/11/2013 01:51 PM    ALT 9 08/20/2013 08:45 AM    AST 12 08/20/2013 08:45 AM    ALK. PHOSPHATASE 66 08/20/2013 08:45 AM    PROTEIN, TOTAL 7.0 08/20/2013 08:45 AM    ALBUMIN 4.0 08/20/2013 08:45 AM    GLOBULIN 3.9 12/04/2012 05:35 PM    GLOBULIN 4.0 12/04/2012 05:35 PM    A-G RATIO 1.3 08/20/2013 08:45 AM           Assessment:     Assessment:      Allison Newman was seen today for new patient.     Diagnoses and associated orders for this visit:    HTN, goal below 130/80  - AMB POC EKG ROUTINE W/ 12 LEADS, INTER & REP    Abnormal ECG  - AMB POC EKG ROUTINE W/ 12 LEADS, INTER & REP    Preoperative clearance  - AMB POC EKG ROUTINE W/ 12 LEADS, INTER & REP    Hypercholesterolemia  - AMB POC EKG ROUTINE W/ 12 LEADS, INTER & REP    BMI 50.0-59.9, adult ([facility])  - AMB POC EKG ROUTINE W/ 12 LEADS, INTER & REP    Chronic pain disorder  - AMB POC EKG ROUTINE W/ 12 LEADS, INTER & REP    Gastroesophageal reflux disease without esophagitis  - AMB POC EKG ROUTINE W/ 12 LEADS, INTER & REP    Type 2 diabetes mellitus without complication ([facility])  - AMB POC EKG ROUTINE W/ 12 LEADS, INTER & REP    Former tobacco use  - AMB POC EKG ROUTINE W/ 12 LEADS, INTER & REP        Specialty Problems        Cardiology Problems    HTN, goal below 130/80        Hypercholesterolemia        Abnormal finding on EKG  ICD-10-CM ICD-9-CM    1. HTN, goal below 130/80 I10 401.9 AMB POC EKG ROUTINE W/ 12 LEADS, INTER & REP   2. Abnormal ECG R94.31 794.31 AMB POC EKG ROUTINE W/ 12 LEADS, INTER & REP   3. Preoperative clearance Z01.818 V72.84 AMB POC EKG ROUTINE W/ 12 LEADS, INTER & REP   4. Hypercholesterolemia E78.0 272.0 AMB POC EKG ROUTINE W/ 12 LEADS, INTER & REP   5. BMI 50.0-59.9, adult ([facility]CC) Z68.43 V85.43 AMB POC EKG ROUTINE W/ 12 LEADS, INTER & REP   6. Chronic pain disorder G89.4 338.4 AMB POC EKG ROUTINE W/ 12 LEADS, INTER & REP   7. Gastroesophageal reflux disease without esophagitis K21.9 530.81 AMB POC EKG ROUTINE W/ 12 LEADS, INTER & REP   8. Type 2 diabetes mellitus without complication (HCC) R67.8 250.00 AMB POC EKG ROUTINE W/ 12 LEADS, INTER & REP   9. Former tobacco use Z87.891 V15.82 AMB POC EKG ROUTINE W/ 12 LEADS, INTER & REP     Orders Placed This Encounter   ??? AMB POC EKG ROUTINE W/ 12 LEADS, INTER & REP     Order Specific Question:  Reason for Exam:     Answer:  routine       PLAN:     Patient presents today requesting cardiac clearance for EGD/colonoscopy with multiple risk factors and ECG showing inferior infarct.  I will evaluate with a Lexiscan stress test and cardiac clearance will be determined based on the results.       Wallace Cullens, NP          [facility]    12/04/2013         Agree with note as outlined by  NP. I confirm findings in history and physical exam. No additional findings noted. Agree with plan as outlined above.     [id number] Ramond Dial, MD

## 2013-12-04 NOTE — Progress Notes (Signed)
Chief Complaint   Patient presents with   ??? New Patient     NP, ref by Dr. Demetrios IsaacsAshrafi, abn EKG.  Denied cardiac symptoms.

## 2013-12-12 MED ORDER — TRIAMTERENE-HYDROCHLOROTHIAZIDE 37.5 MG-25 MG CAP
ORAL_CAPSULE | ORAL | Status: DC
Start: 2013-12-12 — End: 2014-04-10

## 2013-12-18 ENCOUNTER — Institutional Professional Consult (permissible substitution): Admit: 2013-12-18 | Discharge: 2013-12-18 | Payer: MEDICARE | Primary: Geriatric Medicine

## 2013-12-18 DIAGNOSIS — E7800 Pure hypercholesterolemia, unspecified: Secondary | ICD-10-CM

## 2013-12-19 ENCOUNTER — Encounter: Primary: Geriatric Medicine

## 2013-12-23 ENCOUNTER — Encounter: Attending: Family Medicine | Primary: Geriatric Medicine

## 2013-12-24 ENCOUNTER — Encounter: Admit: 2013-12-24 | Discharge: 2013-12-24 | Payer: MEDICARE | Primary: Geriatric Medicine

## 2013-12-25 ENCOUNTER — Institutional Professional Consult (permissible substitution): Admit: 2013-12-25 | Discharge: 2013-12-25 | Payer: MEDICARE | Primary: Geriatric Medicine

## 2013-12-25 DIAGNOSIS — R931 Abnormal findings on diagnostic imaging of heart and coronary circulation: Secondary | ICD-10-CM

## 2013-12-25 NOTE — Progress Notes (Signed)
COMPLETED NUCLEAR LEXISCAN STRESS TEST

## 2013-12-25 NOTE — Progress Notes (Signed)
Quick Note:        Stress test abnormal, not cleared for surgery. F/u to discuss. thx.    ______

## 2013-12-26 MED ORDER — REGADENOSON 0.4 MG/5 ML IV SYRINGE
0.4 mg/5 mL | Freq: Once | INTRAVENOUS | Status: AC
Start: 2013-12-26 — End: 2013-12-26

## 2013-12-26 MED ORDER — KIT FOR THE PREPARATION OF TC-99M-TETROFOSMIN 0.23 MG IV SOLUTION
0.23 mg | Freq: Once | INTRAVENOUS | Status: AC
Start: 2013-12-26 — End: 2013-12-26

## 2013-12-29 MED ORDER — COMBIVENT RESPIMAT 20 MCG-100 MCG/ACTUATION SOLUTION FOR INHALATION
20-100 mcg/actuation | RESPIRATORY_TRACT | Status: DC
Start: 2013-12-29 — End: 2014-06-12

## 2014-01-07 LAB — AMB EXT CREATININE: Creatinine, External: 0.81

## 2014-01-14 ENCOUNTER — Ambulatory Visit
Admit: 2014-01-14 | Discharge: 2014-01-14 | Payer: MEDICARE | Attending: Cardiovascular Disease | Primary: Geriatric Medicine

## 2014-01-14 DIAGNOSIS — E7800 Pure hypercholesterolemia, unspecified: Secondary | ICD-10-CM

## 2014-01-14 NOTE — Progress Notes (Signed)
Allison Reddish NP  Subjective:    Allison Newman is a 73 y.o. female is here for test results.  Allison Newman denies chest pain, arm pain, DOE, fatigue, diaphoresis or dizziness.  She is able to ambulate without issue other then lower back discomfort.     Findings:   The overall quality of the study is excellent. Attenuation  artifact was Present . Left ventricular cavity is noted to be  normal on the rest and stress studies. Additionally, the right  ventricle is normal.     SPECT images demonstrate a medium, fixed abnormality of moderate  degree in the inferior region on the stress and rest images.  Gated SPECT images reveals normal myocardial thickening and wall  motion. The left ventricular ejection fraction was calculated to  be 66 %.    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 Overall left ventricular  systolic function was normal.     These test results indicate moderate to high likelihood for the  presence of angiographically significant coronary artery disease.          Interpreting Physician:   Jodi Mourning, MD     Patient Active Problem List    Diagnosis Date Noted   ??? Abnormal finding on EKG 11/17/2013   ??? Microalbuminuria 08/29/2013   ??? 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 Umbilical hernia 96/06/5407   ??? Abdominal pain 05/08/2013   ??? Abdominal pain, chronic, right lower quadrant 04/08/2013   ??? Gallbladder polyp 04/08/2013   ??? Chronic right hip pain 03/17/2013   ??? Rash, skin 09/25/2012   ??? Lung nodule seen on imaging study 09/25/2012   ??? 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 Hypercholesterolemia 05/29/2012   ??? Acid reflux 01/15/2012   ??? Acute bronchitis 12/12/2011   ??? Elevated BUN 09/13/2011   ??? Low TSH level 09/13/2011   ??? Abnormal urine finding 09/13/2011   ??? HTN, goal below 130/80 08/28/2011   ??? Diabetes mellitus type II, uncontrolled ([place]) 08/28/2011   ??? 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, adult (Allison Newman) 08/28/2011    ??? 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 Hypertension    ??? Diabetes (Allison Newman)    ??? Arthritis    ??? Gastrointestinal disorder      GERD   ??? 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, adult (Allison Newman) 08/28/2011   ??? 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 Elevated BUN 09/13/2011   ??? Rash, skin 09/25/2012   ??? 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 Chronic right hip pain 03/17/2013   ??? Gallbladder polyp 04/08/2013   ??? Umbilical hernia 10/04/1912   ??? Abdominal pain 05/08/2013   ??? Hypercholesterolemia    ??? GERD (gastroesophageal reflux disease)    ??? 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 Microalbuminuria 08/29/2013   ??? Other unknown and unspecified cause of morbidity or mortality      hx of bronchitis   ??? Abnormal finding on EKG 11/17/2013      Past Surgical History   Procedure Laterality Date   ??? Hx orthopaedic  1998     knee replacement right   ??? Hx orthopaedic  2000     screw in foot left   ??? Hx other surgical       colonoscopy     No Known  Allergies   Family History   Problem Relation Age of Onset   ??? Diabetes Mother    ??? Heart Disease Mother      rheumatic fever   ??? Cancer Father      lung   ??? Cancer Sister      colon & breast   ??? Cancer Brother      throat      Current Outpatient Prescriptions   Medication Sig   ??? COMBIVENT RESPIMAT 20-100 mcg/actuation inhaler inhale 1 puff by mouth every 6 hours   ??? triamterene-hydrochlorothiazide (DYAZIDE) 37.5-25 mg per capsule take 1 capsule by mouth once daily   ??? omeprazole (PRILOSEC) 20 mg capsule take 1 capsule by mouth twice a day before meals   ??? sitaGLIPtin-metFORMIN (JANUMET) 50-1,000 mg per tablet Take 0.5 Tabs by mouth two (2) times daily (with meals).   ??? simvastatin (ZOCOR) 20 mg tablet Take 20 mg by mouth every other day. Take 0.5 tab every other day.   ??? diclofenac EC (VOLTAREN) 75 mg EC tablet take 1 tablet by mouth twice a day with food    ??? HYDROcodone-acetaminophen (NORCO) 7.5-325 mg per tablet Take 1 Tab by mouth every eight (8) hours as needed for Pain. Max Daily Amount: 3 Tabs.   ??? metoprolol (LOPRESSOR) 50 mg tablet take 1 tablet by mouth twice a day   ??? lisinopril (PRINIVIL, ZESTRIL) 10 mg tablet Take 1 Tab by mouth daily.   ??? FREESTYLE LANCETS 28 gauge misc    ??? omeprazole (PRILOSEC) 20 mg capsule Take 20 mg by mouth daily.   ??? methimazole (TAPAZOLE) 5 mg tablet Take 5 mg by mouth daily.   ??? glucose blood VI test strips (FREESTYLE LITE STRIPS) strip Test twice daily.   ??? Lancets (FREESTYLE LANCETS) misc Test blood sugar 4 times a day   ??? cyanocobalamin 1,000 mcg tablet Take 1,000 mcg by mouth daily.   ??? Cholecalciferol, Vitamin D3, 50,000 unit cap Take 1 Tab by mouth every Sunday.   ??? aspirin 81 mg tablet Take 81 mg by mouth.   ??? simvastatin (ZOCOR) 40 mg tablet      No current facility-administered medications for this visit.      Filed Vitals:    01/14/14 0858 01/14/14 0912   BP: 130/70 122/82   Pulse:  70   Resp:  13   Height: 5' 4"  (1.626 m)    Weight: 343 lb 4.8 oz (155.72 kg)      History     Social History   ??? Marital Status: DIVORCED     Spouse Name: N/A     Number of Children: N/A   ??? Years of Education: N/A     Occupational History   ??? Not on file.     Social History Main Topics   ??? Smoking status: Former Smoker -- 0.25 packs/day for 1 years     Types: Cigarettes     Quit date: 06/03/1991   ??? Smokeless tobacco: Never Used   ??? Alcohol Use: No   ??? Drug Use: No   ??? Sexual Activity: Not on file     Other Topics Concern   ??? Not on file     Social History Narrative       I have reviewed the nurses notes, vitals, problem list, allergy list, medical history, family medical, social history and medications.    Review of Symptoms:    General: Pt denies excessive weight gain or loss. Pt is able  to conduct ADL's  HEENT: Denies blurred vision, headaches, epistaxis and difficulty swallowing.   Respiratory: Denies shortness of breath, DOE, wheezing or stridor.  Cardiovascular: Denies precordial pain, +palpitations,no  edema or PND  Gastrointestinal: Denies poor appetite, indigestion, abdominal pain or blood in stool      Physical Exam: ??    General: Well developed, in no acute distress.  HEENT: No carotid bruits, no JVD, trach is midline.   Heart: ??Normal S1/S2 negative S3 or S4. Regular, no murmur, gallop or rub.??  Respiratory: Clear bilaterally x 4, no wheezing or rales  Abdomen:?? ??Soft, non-tender, bowel sounds are active.??  Extremities:  No edema, normal cap refill, no cyanosis.  Neuro: A&Ox3, speech clear, gait stable.   Skin: Skin color is normal. No rashes or lesions. Non diaphoretic  Vascular: 2+ pulses symmetric in all extremities      Cardiographics    ECG:   Results for orders placed or performed during the hospital encounter of 11/11/13   EKG, 12 LEAD, INITIAL   Result Value Ref Range    Ventricular Rate 67 BPM    Atrial Rate 67 BPM    P-R Interval 182 ms    QRS Duration 84 ms    Q-T Interval 412 ms    QTC Calculation (Bezet) 435 ms    Calculated P Axis 28 degrees    Calculated R Axis -12 degrees    Calculated T Axis -16 degrees    Diagnosis       Normal sinus rhythm  Cannot rule out Anterior infarct , age undetermined  No previous ECGs available  Confirmed by Cristella Stiver, P.V. [phone]) on 11/11/2013 6:44:29 PM          Labs:  Lab Results   Component Value Date/Time    SODIUM 142 11/11/2013 01:51 PM    POTASSIUM 4.1 11/11/2013 01:51 PM    CHLORIDE 106 11/11/2013 01:51 PM    CO2 28 11/11/2013 01:51 PM    ANION GAP 8 11/11/2013 01:51 PM    GLUCOSE 120 11/11/2013 01:51 PM    BUN 24 11/11/2013 01:51 PM    CREATININE 0.73 11/11/2013 01:51 PM    BUN/CREATININE RATIO 33 11/11/2013 01:51 PM    GFR EST AA >60 11/11/2013 01:51 PM    GFR EST NON-AA >60 11/11/2013 01:51 PM    CALCIUM 8.5 11/11/2013 01:51 PM    ALT 9 08/20/2013 08:45 AM    AST 12 08/20/2013 08:45 AM    ALK. PHOSPHATASE 66 08/20/2013 08:45 AM     PROTEIN, TOTAL 7.0 08/20/2013 08:45 AM    ALBUMIN 4.0 08/20/2013 08:45 AM    GLOBULIN 3.9 12/04/2012 05:35 PM    GLOBULIN 4.0 12/04/2012 05:35 PM    A-G RATIO 1.3 08/20/2013 08:45 AM      Lab Results   Component Value Date/Time    WBC 5.0 11/11/2013 01:51 PM    HGB 11.8 11/11/2013 01:51 PM    HCT 38.4 11/11/2013 01:51 PM    PLATELET 139 11/11/2013 01:51 PM    MCV 89.7 11/11/2013 01:51 PM    All Cardiac Markers in the last 24 hours:  No results found for: CPK, CKMMB, CKMB, RCK3, CKMBT, CKNDX, CKND1, MYO, TROPT, TROIQ, TROI, TROPT, TNIPOC, BNP, BNPP              Assessment:     Assessment:         ICD-10-CM ICD-9-CM    1. Hypercholesterolemia E78.0 272.0    2. Diabetes mellitus type II,  uncontrolled ([place]) E11.65 250.02    3. 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 Abnormal nuclear stress test R93.1 794.39        No orders of the defined types were placed in this encounter.        Plan:     Patient presents with a fixed inferior wall defect on NST, she is asymptomatic of CAD, differential is attenuation artifact secondary to body habitus.  She is on a betablocker and statin. Allison Ghazarian is cleared for endoscopy.  Discussed s/s of CAD to look for and follow up 6 months; sooner is she develops symptoms   Berline Chough, NP          [facility]    01/14/2014         Agree with note as outlined by  NP. I confirm findings in history and physical exam. No additional findings noted. Agree with plan as outlined above.     [id number] Ramond Dial, MD

## 2014-01-14 NOTE — Progress Notes (Signed)
Chief Complaint   Patient presents with   ??? Other     results, thinks she feels flutter in middle of chest sometimes

## 2014-01-22 ENCOUNTER — Ambulatory Visit: Admit: 2014-01-22 | Discharge: 2014-01-22 | Payer: MEDICARE | Attending: Family Medicine | Primary: Geriatric Medicine

## 2014-01-22 ENCOUNTER — Inpatient Hospital Stay: Admit: 2014-05-21 | Payer: MEDICARE | Primary: Geriatric Medicine

## 2014-01-22 DIAGNOSIS — Z23 Encounter for immunization: Secondary | ICD-10-CM

## 2014-01-22 DIAGNOSIS — E1165 Type 2 diabetes mellitus with hyperglycemia: Secondary | ICD-10-CM

## 2014-01-22 LAB — AMB POC URINALYSIS DIP STICK AUTO W/O MICRO
Bilirubin (UA POC): NEGATIVE
Glucose (UA POC): NEGATIVE
Ketones (UA POC): NEGATIVE
Nitrites (UA POC): NEGATIVE
Protein (UA POC): NEGATIVE mg/dL
Specific gravity (UA POC): 1.015 (ref 1.001–1.035)
pH (UA POC): 7.5 (ref 4.6–8.0)

## 2014-01-22 MED ORDER — ZOSTER VACCINE LIVE (PF) 19,400 UNIT SUB-Q SOLN
19400 unit/0.65 mL | Freq: Once | SUBCUTANEOUS | Status: AC
Start: 2014-01-22 — End: 2014-01-22

## 2014-01-22 MED ORDER — SITAGLIPTIN-METFORMIN 50 MG-500 MG TAB
50-500 mg | ORAL_TABLET | Freq: Two times a day (BID) | ORAL | Status: DC
Start: 2014-01-22 — End: 2014-08-18

## 2014-01-22 MED ORDER — SIMVASTATIN 20 MG TAB
20 mg | ORAL_TABLET | ORAL | Status: DC
Start: 2014-01-22 — End: 2015-02-17

## 2014-01-22 MED ORDER — HYDROCODONE-ACETAMINOPHEN 7.5 MG-325 MG TAB
ORAL_TABLET | Freq: Three times a day (TID) | ORAL | Status: DC | PRN
Start: 2014-01-22 — End: 2014-05-07

## 2014-01-22 NOTE — Progress Notes (Signed)
This is an Initial Medicare Annual Wellness Exam (AWV) (Performed 12 months after IPPE or effective date of Medicare Part B enrollment, Once in a lifetime)    I have reviewed the patient's medical history in detail and updated the computerized patient record.     History     Present for CPE, last Complete Physical exam was coupleyrs ago ,  Up todate w/ all vaccination, last tetanus vaccine was in >5 yrs ago  .     last mammog was nl and in 2015,    last pap smear normal and was in  Few yrs ago    .       last colonoscopy was normal and was less than 10 yrs  Ago,   No past surgical hx,  last bone dexa scan was normal and was few yrs  Ago,      No family hx of breast cancer     no family hx of colon cancer, parent deceased of old age. not sexaully active and uses [employer], not physically active,  compliant w/ meds,++ Rf needed for today for her meds.       Past Medical History   Diagnosis Date   ??? Hypertension    ??? Diabetes (HCC)    ??? Arthritis    ??? Gastrointestinal disorder      GERD   ??? 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 Elevated BUN 09/13/2011   ??? Rash, skin 09/25/2012   ??? 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 Chronic right hip pain 03/17/2013   ??? Gallbladder polyp 04/08/2013   ??? Umbilical hernia 05/08/2013   ??? Abdominal pain 05/08/2013   ??? Hypercholesterolemia    ??? GERD (gastroesophageal reflux disease)    ??? 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 Microalbuminuria 08/29/2013   ??? Other unknown and unspecified cause of morbidity or mortality      hx of bronchitis   ??? Abnormal finding on EKG 11/17/2013      Past Surgical History   Procedure Laterality Date   ??? Hx orthopaedic  1998     knee replacement right   ??? Hx orthopaedic  2000     screw in foot left   ??? Hx other surgical       colonoscopy     Current Outpatient Prescriptions   Medication Sig Dispense Refill    ??? COMBIVENT RESPIMAT 20-100 mcg/actuation inhaler inhale 1 puff by mouth every 6 hours 4 Inhaler 4   ??? triamterene-hydrochlorothiazide (DYAZIDE) 37.5-25 mg per capsule take 1 capsule by mouth once daily 30 Cap 3   ??? omeprazole (PRILOSEC) 20 mg capsule take 1 capsule by mouth twice a day before meals 60 Cap 6   ??? simvastatin (ZOCOR) 20 mg tablet Take 20 mg by mouth every other day. Take 0.5 tab every other day.     ??? diclofenac EC (VOLTAREN) 75 mg EC tablet take 1 tablet by mouth twice a day with food 30 Tab 3   ??? HYDROcodone-acetaminophen (NORCO) 7.5-325 mg per tablet Take 1 Tab by mouth every eight (8) hours as needed for Pain. Max Daily Amount: 3 Tabs. 90 Tab 0   ??? metoprolol (LOPRESSOR) 50 mg tablet take 1 tablet by mouth twice a day 60 Tab 7   ??? lisinopril (PRINIVIL, ZESTRIL) 10 mg tablet Take 1 Tab by mouth daily. 30 Tab 6   ??? FREESTYLE  LANCETS 28 gauge misc   1   ??? omeprazole (PRILOSEC) 20 mg capsule Take 20 mg by mouth daily.  0   ??? methimazole (TAPAZOLE) 5 mg tablet Take 5 mg by mouth daily.     ??? glucose blood VI test strips (FREESTYLE LITE STRIPS) strip Test twice daily. 1 Package 11   ??? Lancets (FREESTYLE LANCETS) misc Test blood sugar 4 times a day 1 Package 11   ??? cyanocobalamin 1,000 mcg tablet Take 1,000 mcg by mouth daily.     ??? Cholecalciferol, Vitamin D3, 50,000 unit cap Take 1 Tab by mouth every Sunday.     ??? aspirin 81 mg tablet Take 81 mg by mouth.     ??? FLUZONE HIGH-DOSE 2015-16, PF, syrg injection   0   ??? ergocalciferol (ERGOCALCIFEROL) 50,000 unit capsule   0     No Known Allergies  Family History   Problem Relation Age of Onset   ??? Diabetes Mother    ??? Heart Disease Mother      rheumatic fever   ??? Cancer Father      lung   ??? Cancer Sister      colon & breast   ??? Cancer Brother      throat     History   Substance Use Topics   ??? Smoking status: Former Smoker -- 0.25 packs/day for 1 years     Types: Cigarettes     Quit date: 06/03/1991   ??? Smokeless tobacco: Never Used   ??? Alcohol Use: No      Patient Active Problem List   Diagnosis Code   ??? HTN, goal below 130/80 I10   ??? Diabetes mellitus type II, uncontrolled (HCC) E11.65   ??? 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, adult (HCC) E66.01, Z68.44   ??? Neuropathic arthritis M14.60   ??? Elevated BUN R79.9   ??? Low TSH level R94.6   ??? Abnormal urine finding R82.90   ??? Acute bronchitis J20.9   ??? Acid reflux K21.9   ??? Hypercholesterolemia E78.0   ??? Rash, skin R21   ??? Lung nodule seen on imaging study R91.1   ??? Intrathoracic goiter E01.0   ??? Chronic right hip pain M25.551, G89.29   ??? Abdominal pain, chronic, right lower quadrant R10.31, G89.29   ??? Gallbladder polyp K82.4   ??? Umbilical hernia K42.9   ??? Abdominal pain R10.9   ??? Chronic pain disorder G89.4   ??? Microalbuminuria R80.9   ??? Abnormal finding on EKG R94.31         Depression Risk Factor Screening:     PHQ 2 / 9, over the last two weeks 08/20/2013   Little interest or pleasure in doing things Not at all   Feeling down, depressed or hopeless Not at all   Total Score PHQ 2 0     Alcohol Risk Factor Screening:   On any occasion during the past 3 months, have you had more than 3 drinks containing alcohol?  No    Do you average more than 7 drinks per week?  No    Functional Ability and Level of Safety:     Hearing Loss   normal-to-mild    Activities of Daily Living   Self-care.   Requires assistance with: no ADLs    Fall Risk     Fall Risk Assessment, last 12 mths 08/20/2013   Able to walk? Yes   Fall in past 12 months? No     Abuse Screen  Patient is not abused    Review of Systems   Constitutional: negative  Eyes: negative  Ears, nose, mouth, throat, and face: negative  Respiratory: negative  Cardiovascular: negative  Gastrointestinal: negative  Genitourinary:negative  Integument/breast: negative  Hematologic/lymphatic: negative  Musculoskeletal:negative  Neurological: negative  Behavioral/Psych: negative  Endocrine: negative  Allergic/Immunologic: negative    Physical Examination     No exam data present     Evaluation of Cognitive Function:  Mood/affect:  neutral, happy  Appearance: age appropriate and well dressed  Family member/caregiver input: none present    BP 128/64 mmHg   Pulse 76   Temp(Src) 98.2 ??F (36.8 ??C) (Oral)   Resp 12   Ht 5\' 4"  (1.626 m)   Wt 343 lb 9.6 oz (155.856 kg)   BMI 58.95 kg/m2   SpO2 97%  General:  Alert, cooperative, no distress, appears stated age.   Head:  Normocephalic, without obvious abnormality, atraumatic.   Eyes:  Conjunctivae/corneas clear. PERRL, EOMs intact. Fundi benign.   Ears:  Normal TMs and external ear canals both ears.   Nose: Nares normal. Septum midline. Mucosa normal. No drainage or sinus tenderness.   Throat: Lips, mucosa, and tongue normal. Teeth and gums normal.   Neck: Supple, symmetrical, trachea midline, no adenopathy, thyroid: no enlargement/tenderness/nodules, no carotid bruit and no JVD.   Back:   Symmetric, no curvature. ROM normal. No CVA tenderness.   Lungs:   Clear to auscultation bilaterally.   Chest wall:  No tenderness or deformity.   Heart:  Regular rate and rhythm, S1, S2 normal, no murmur, click, rub or gallop.   Breast Exam:  No tenderness, masses, or nipple abnormality.   Abdomen:   Soft, non-tender. Bowel sounds normal. No masses,  No organomegaly.   Genitalia:  Normal female without lesion, discharge or tenderness.   Rectal:  Normal tone,  no masses or tenderness  Guaiac negative stool.   Extremities: Extremities normal, atraumatic, no cyanosis or edema.   Pulses: 2+ and symmetric all extremities.   Skin: Skin color, texture, turgor normal. No rashes or lesions.   Lymph nodes: Cervical, supraclavicular, and axillary nodes normal.   Neurologic: CNII-XII intact. Normal strength, sensation and reflexes throughout.       Patient Care Team:  Purnell ShoemakerAbbas Gunnard Dorrance, MD as PCP - General (Family Practice)  Melina ModenaBrenda Armenti-Kapros, MD (Endocrinology)  Diane Karen Kitchensrujillo  Joann Conner  [place] Beckie Saltsavindra V, MD as Physician (Cardiology)     Advice/Referrals/Counseling   Education and counseling provided:  Are appropriate based on today's review and evaluation  End-of-Life planning (with patient's consent)  Pneumococcal Vaccine  Influenza Vaccine  Bone mass measurement (DEXA)  Screening for glaucoma  Diabetes screening test  Diabetes outpatient self-management training services    Assessment/Plan   Allison BernJean was seen today for annual wellness visit, hypertension and diabetes.    Diagnoses and all orders for this visit:    Need for shingles vaccine  Orders:  -     varicella zoster vacine live (ZOSTAVAX) 19,400 unit/0.65 mL susr injection; 1 Vial by SubCUTAneous route once for 1 dose.    Bone disorder  Orders:  -     DEXA BONE DENSITY STUDY AXIAL; Future    Routine general medical examination at a health care facility    Screening for depression    Screening for alcoholism    Screening for diabetes mellitus    Need for diphtheria-tetanus-pertussis (Tdap) vaccine, adult/adolescent  Orders:  -     TETANUS, DIPHTHERIA TOXOIDS AND  ACELLULAR PERTUSSIS VACCINE (TDAP), IN INDIVIDS. >=7, IM    Diabetes mellitus type II, uncontrolled (HCC)  Orders:  -     CBC W/O DIFF  -     METABOLIC PANEL, COMPREHENSIVE  -     TSH, 3RD GENERATION  -     LIPID PANEL  -     HEMOGLOBIN A1C    HTN, goal below 130/80  Orders:  -     CBC W/O DIFF  -     METABOLIC PANEL, COMPREHENSIVE  -     TSH, 3RD GENERATION  -     LIPID PANEL  -     HEMOGLOBIN A1C    Hypercholesterolemia  Orders:  -     CBC W/O DIFF  -     METABOLIC PANEL, COMPREHENSIVE  -     TSH, 3RD GENERATION  -     LIPID PANEL  -     HEMOGLOBIN A1C    Chronic pain disorder  Orders:  -     CBC W/O DIFF  -     METABOLIC PANEL, COMPREHENSIVE  -     TSH, 3RD GENERATION  -     LIPID PANEL  -     HEMOGLOBIN A1C    Burning with urination  Orders:  -     AMB POC URINALYSIS DIP STICK AUTO W/O MICRO  -     CULTURE, URINE    Pyuria  Orders:  -     CULTURE, URINE    Other orders   -     sitaGLIPtin-metFORMIN (JANUMET) 50-500 mg per tablet; Take 1 Tab by mouth two (2) times daily (with meals).  -     simvastatin (ZOCOR) 20 mg tablet; Take 1 Tab by mouth every other day. Take 0.5 tab every other day.  -     HYDROcodone-acetaminophen (NORCO) 7.5-325 mg per tablet; Take 1 Tab by mouth every eight (8) hours as needed for Pain. Max Daily Amount: 3 Tabs.  -     CVD REPORT  -     DIABETES PATIENT EDUCATION      lab results and schedule of future lab studies reviewed with patient  reviewed diet, exercise and weight control  cardiovascular risk and specific lipid/LDL goals reviewed  reviewed medications and side effects in detail  use of aspirin to prevent MI and TIA's discussed  radiology results and schedule of future radiology studies reviewed with patient.

## 2014-01-22 NOTE — Progress Notes (Signed)
Chief Complaint   Patient presents with   ??? Annual Wellness Visit   ??? Hypertension     4 month f/u   ??? Diabetes     4 month f/u

## 2014-01-23 LAB — METABOLIC PANEL, COMPREHENSIVE
A-G Ratio: 1.3 (ref 1.1–2.5)
ALT (SGPT): 10 IU/L (ref 0–32)
AST (SGOT): 11 IU/L (ref 0–40)
Albumin: 3.9 g/dL (ref 3.5–4.8)
Alk. phosphatase: 61 IU/L (ref 39–117)
BUN/Creatinine ratio: 35 — ABNORMAL HIGH (ref 11–26)
BUN: 28 mg/dL — ABNORMAL HIGH (ref 8–27)
Bilirubin, total: 0.4 mg/dL (ref 0.0–1.2)
CO2: 25 mmol/L (ref 18–29)
Calcium: 9.4 mg/dL (ref 8.7–10.3)
Chloride: 102 mmol/L (ref 97–108)
Creatinine: 0.8 mg/dL (ref 0.57–1.00)
GFR est AA: 86 mL/min/{1.73_m2} (ref >59)
GFR est non-AA: 74 mL/min/{1.73_m2} (ref >59)
GLOBULIN, TOTAL: 3.1 g/dL (ref 1.5–4.5)
Glucose: 159 mg/dL — ABNORMAL HIGH (ref 65–99)
Potassium: 4.4 mmol/L (ref 3.5–5.2)
Protein, total: 7 g/dL (ref 6.0–8.5)
Sodium: 142 mmol/L (ref 134–144)

## 2014-01-23 LAB — CBC W/O DIFF
HCT: 39.2 % (ref 34.0–46.6)
HGB: 12.7 g/dL (ref 11.1–15.9)
MCH: 29 pg (ref 26.6–33.0)
MCHC: 32.4 g/dL (ref 31.5–35.7)
MCV: 90 fL (ref 79–97)
PLATELET: 194 10*3/uL (ref 150–379)
RBC: 4.38 x10E6/uL (ref 3.77–5.28)
RDW: 14.5 % (ref 12.3–15.4)
WBC: 5.6 10*3/uL (ref 3.4–10.8)

## 2014-01-23 LAB — HEMOGLOBIN A1C WITH EAG: Hemoglobin A1c: 6.5 % — ABNORMAL HIGH (ref 4.8–5.6)

## 2014-01-23 LAB — LIPID PANEL
Cholesterol, total: 170 mg/dL (ref 100–199)
HDL Cholesterol: 53 mg/dL (ref >39)
LDL, calculated: 98 mg/dL (ref 0–99)
Triglyceride: 96 mg/dL (ref 0–149)
VLDL, calculated: 19 mg/dL (ref 5–40)

## 2014-01-23 LAB — CVD REPORT

## 2014-01-23 LAB — TSH 3RD GENERATION: TSH: 1.46 u[IU]/mL (ref 0.450–4.500)

## 2014-01-24 LAB — CULTURE, URINE

## 2014-02-10 MED ORDER — DICLOFENAC 75 MG TAB, DELAYED RELEASE
75 mg | ORAL_TABLET | ORAL | Status: DC
Start: 2014-02-10 — End: 2014-05-07

## 2014-02-10 NOTE — Telephone Encounter (Signed)
Pt called requesting refill for diclofenac EC (VOLTAREN) 75 mg EC tablet, pt phone # [phone]

## 2014-02-10 NOTE — Telephone Encounter (Signed)
Med is pending

## 2014-03-23 MED ORDER — LISINOPRIL 10 MG TAB
10 mg | ORAL_TABLET | ORAL | Status: DC
Start: 2014-03-23 — End: 2014-10-03

## 2014-04-13 MED ORDER — TRIAMTERENE-HYDROCHLOROTHIAZIDE 37.5 MG-25 MG CAP
ORAL_CAPSULE | ORAL | Status: DC
Start: 2014-04-13 — End: 2014-08-10

## 2014-04-16 MED ORDER — METOPROLOL TARTRATE 50 MG TAB
50 mg | ORAL_TABLET | ORAL | Status: DC
Start: 2014-04-16 — End: 2014-12-01

## 2014-05-07 ENCOUNTER — Ambulatory Visit: Admit: 2014-05-07 | Discharge: 2014-05-07 | Payer: MEDICARE | Attending: Family Medicine | Primary: Geriatric Medicine

## 2014-05-07 DIAGNOSIS — IMO0002 Reserved for concepts with insufficient information to code with codable children: Secondary | ICD-10-CM

## 2014-05-07 LAB — AMB POC HEMOGLOBIN A1C: Hemoglobin A1c (POC): 6.5 %

## 2014-05-07 MED ORDER — HYDROCODONE-ACETAMINOPHEN 7.5 MG-325 MG TAB
ORAL_TABLET | Freq: Three times a day (TID) | ORAL | Status: DC | PRN
Start: 2014-05-07 — End: 2016-04-19

## 2014-05-07 MED ORDER — HYDROCODONE-ACETAMINOPHEN 7.5 MG-325 MG TAB
ORAL_TABLET | Freq: Three times a day (TID) | ORAL | Status: DC | PRN
Start: 2014-05-07 — End: 2014-10-27

## 2014-05-07 MED ORDER — ZOSTER VACCINE LIVE (PF) 19,400 UNIT SUB-Q SOLN
19400 unit/0.65 mL | Freq: Once | SUBCUTANEOUS | Status: AC
Start: 2014-05-07 — End: 2014-05-07

## 2014-05-07 MED ORDER — DICLOFENAC 75 MG TAB, DELAYED RELEASE
75 mg | ORAL_TABLET | ORAL | Status: DC
Start: 2014-05-07 — End: 2014-08-13

## 2014-05-07 MED ORDER — DOCUSATE SODIUM 100 MG CAP
100 mg | ORAL_CAPSULE | Freq: Three times a day (TID) | ORAL | Status: AC | PRN
Start: 2014-05-07 — End: 2014-08-05

## 2014-05-07 NOTE — Progress Notes (Signed)
Chief Complaint   Patient presents with   ??? Diabetes     f/u   ??? Hypertension     f/u

## 2014-05-07 NOTE — Progress Notes (Signed)
HISTORY OF PRESENT ILLNESS  Allison Newman is a 74 y.o. female.  HPI     Chronic pain syndrome  His pain has been an ongoing problem for the patient for which all Started few yrs ago,  patient states that the current dosage of the meds given, helping, regardless of the side effects,  In addition with the current dosage, the pt capable of doing things specially the activity of the pt daily living, also improving the quality of the pt's life, pt states that she had one of her knee replaced in the past, did not help her with the amount of the pain,The pt has tried otc meds but they did not help and created the + abdominal upset from NSAIDS, pt currently pain level is at 8 /10 w/out med, and states that the pain persist without medication. the pt knows that it is a chronic condition, and the offered surgical corrections, not doing [facility] shopping for meds, has tried all other available non narcotic pain meds, not responded to any of them, this is the only one which helped her in the past  DMtype II    Compliant w/ meds, having nodiabetic diet, and not doing much of daily exercise, obtains home glucose monitoring averaging  130's  . No Rf needed for today. Denies any tingling sensation, polyurea and polydipsia, last a1c was not at target 7.5%%    . Last podiatry visit 2015   And last eye exam was 2015  Last urine microalbumin 2015 and was normal  . Feeling better since the last visit.    Current Outpatient Prescriptions   Medication Sig Dispense Refill   ??? ergocalciferol (ERGOCALCIFEROL) 50,000 unit capsule   0   ??? metoprolol (LOPRESSOR) 50 mg tablet take 1 tablet by mouth twice a day 60 Tab 7   ??? triamterene-hydrochlorothiazide (DYAZIDE) 37.5-25 mg per capsule take 1 tablet once daily 30 Cap 3   ??? lisinopril (PRINIVIL, ZESTRIL) 10 mg tablet take 1 tablet by mouth once daily 30 Tab 6   ??? diclofenac EC (VOLTAREN) 75 mg EC tablet take 1 tablet by mouth twice a day with food 30 Tab 3    ??? FLUZONE HIGH-DOSE 2015-16, PF, syrg injection   0   ??? sitaGLIPtin-metFORMIN (JANUMET) 50-500 mg per tablet Take 1 Tab by mouth two (2) times daily (with meals). 60 Tab 6   ??? simvastatin (ZOCOR) 20 mg tablet Take 1 Tab by mouth every other day. Take 0.5 tab every other day. 30 Tab 6   ??? HYDROcodone-acetaminophen (NORCO) 7.5-325 mg per tablet Take 1 Tab by mouth every eight (8) hours as needed for Pain. Max Daily Amount: 3 Tabs. 90 Tab 0   ??? COMBIVENT RESPIMAT 20-100 mcg/actuation inhaler inhale 1 puff by mouth every 6 hours 4 Inhaler 4   ??? omeprazole (PRILOSEC) 20 mg capsule take 1 capsule by mouth twice a day before meals 60 Cap 6   ??? FREESTYLE LANCETS 28 gauge misc   1   ??? methimazole (TAPAZOLE) 5 mg tablet Take 5 mg by mouth daily.     ??? glucose blood VI test strips (FREESTYLE LITE STRIPS) strip Test twice daily. 1 Package 11   ??? Lancets (FREESTYLE LANCETS) misc Test blood sugar 4 times a day 1 Package 11   ??? cyanocobalamin 1,000 mcg tablet Take 1,000 mcg by mouth daily.     ??? Cholecalciferol, Vitamin D3, 50,000 unit cap Take 1 Tab by mouth every Sunday.     ???  aspirin 81 mg tablet Take 81 mg by mouth.     ??? simvastatin (ZOCOR) 40 mg tablet   0     No Known Allergies  Past Medical History   Diagnosis Date   ??? Hypertension    ??? Diabetes ([place])    ??? Arthritis    ??? Gastrointestinal disorder      GERD   ??? 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, adult ([place]) 08/28/2011   ??? 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 Elevated BUN 09/13/2011   ??? Rash, skin 09/25/2012   ??? 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 Chronic right hip pain 03/17/2013   ??? Gallbladder polyp 04/08/2013   ??? Umbilical hernia 08/06/9526   ??? Abdominal pain 05/08/2013   ??? Hypercholesterolemia    ??? GERD (gastroesophageal reflux disease)    ??? 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 Microalbuminuria 08/29/2013   ??? Other unknown and unspecified cause of morbidity or mortality      hx of bronchitis   ??? Abnormal finding on EKG 11/17/2013      Past Surgical History   Procedure Laterality Date   ??? Hx orthopaedic  1998     knee replacement right   ??? Hx orthopaedic  2000     screw in foot left   ??? Hx other surgical       colonoscopy     Family History   Problem Relation Age of Onset   ??? Diabetes Mother    ??? Heart Disease Mother      rheumatic fever   ??? Cancer Father      lung   ??? Cancer Sister      colon & breast   ??? Cancer Brother      throat     History   Substance Use Topics   ??? Smoking status: Former Smoker -- 0.25 packs/day for 1 years     Types: Cigarettes     Quit date: 06/03/1991   ??? Smokeless tobacco: Never Used   ??? Alcohol Use: No      Lab Results  Component Value Date/Time   WBC 5.6 01/22/2014 08:40 AM   HGB 12.7 01/22/2014 08:40 AM   HCT 39.2 01/22/2014 08:40 AM   PLATELET 194 01/22/2014 08:40 AM   MCV 90 01/22/2014 08:40 AM       Lab Results  Component Value Date/Time   HEMOGLOBIN A1C 6.5 01/22/2014 08:40 AM   HEMOGLOBIN A1C 6.6 11/17/2013 09:01 AM   HEMOGLOBIN A1C 6.5 08/28/2012 10:56 AM   GLUCOSE 159 01/22/2014 08:40 AM   GLUCOSE (POC) 143 01/03/2008 09:21 AM   MICROALB/CREAT RATIO (UG/MG CREAT.) 8.0 08/20/2013 08:45 AM   LDL, CALCULATED 98 01/22/2014 08:40 AM   CREATININE (POC) 0.8 04/11/2013 11:28 AM   CREATININE 0.80 01/22/2014 08:40 AM      Lab Results  Component Value Date/Time   CHOLESTEROL, TOTAL 170 01/22/2014 08:40 AM   HDL CHOLESTEROL 53 01/22/2014 08:40 AM   LDL, CALCULATED 98 01/22/2014 08:40 AM   TRIGLYCERIDE 96 01/22/2014 08:40 AM       Lab Results  Component Value Date/Time   ALT 10 01/22/2014 08:40 AM   AST 11 01/22/2014 08:40 AM   ALK. PHOSPHATASE 61 01/22/2014 08:40 AM   BILIRUBIN, DIRECT 0.1 12/04/2012 05:35 PM   BILIRUBIN, TOTAL 0.4 01/22/2014 08:40 AM       Lab Results  Component Value Date/Time   GFR EST AA 86 01/22/2014 08:40 AM  GFR EST NON-AA 74 01/22/2014 08:40 AM   CREATININE (POC) 0.8 04/11/2013 11:28 AM   CREATININE 0.80 01/22/2014 08:40 AM   BUN 28 01/22/2014 08:40 AM   SODIUM 142 01/22/2014 08:40 AM    POTASSIUM 4.4 01/22/2014 08:40 AM   CHLORIDE 102 01/22/2014 08:40 AM   CO2 25 01/22/2014 08:40 AM         Review of Systems   Constitutional: Negative for fever and chills.   HENT: Negative for ear pain and nosebleeds.    Eyes: Negative for blurred vision, pain and discharge.   Respiratory: Negative for shortness of breath.    Cardiovascular: Negative for chest pain and leg swelling.   Gastrointestinal: Negative for nausea, vomiting, diarrhea and constipation.   Genitourinary: Negative for frequency.   Musculoskeletal: Negative for joint pain.   Skin: Negative for itching and rash.   Neurological: Negative for headaches.   Psychiatric/Behavioral: Negative for depression. The patient is not nervous/anxious.        Physical Exam   Constitutional: She is oriented to person, place, and time. She appears well-developed and well-nourished.   HENT:   Head: Normocephalic and atraumatic.   Eyes: Conjunctivae and EOM are normal.   Neck: Normal range of motion. Neck supple.   Cardiovascular: Normal rate, regular rhythm and normal heart sounds.    No murmur heard.  Pulmonary/Chest: Effort normal and breath sounds normal.   Abdominal: Soft. Bowel sounds are normal. She exhibits no distension.   Musculoskeletal: Normal range of motion. She exhibits no edema.   Lymphadenopathy:     She has no cervical adenopathy.   Neurological: She is alert and oriented to person, place, and time.   Skin: No erythema.   Psychiatric: Her behavior is normal.   Nursing note and vitals reviewed.      ASSESSMENT and PLAN  Aniesa was seen today for diabetes and hypertension.    Diagnoses and all orders for this visit:    Diabetes mellitus type II, uncontrolled ([place])  Orders:  -     AMB POC HEMOGLOBIN A1C    HTN, goal below 130/80    Screening for osteoporosis  Orders:  -     DEXA BONE DENSITY STUDY AXIAL; Future    Encounter for immunization  Orders:  -     varicella zoster vacine live (ZOSTAVAX) 19,400 unit/0.65 mL susr  injection; 1 Vial by SubCUTAneous route once for 1 dose.    Chronic right hip pain    Chronic pain disorder    Other orders  -     HYDROcodone-acetaminophen (NORCO) 7.5-325 mg per tablet; Take 1 Tab by mouth every eight (8) hours as needed for Pain. Max Daily Amount: 3 Tabs.  -     HYDROcodone-acetaminophen (NORCO) 7.5-325 mg per tablet; Take 1 Tab by mouth every eight (8) hours as needed for Pain. Max Daily Amount: 3 Tabs.  -     docusate sodium (COLACE) 100 mg capsule; Take 1 Cap by mouth three (3) times daily as needed for Constipation for up to 90 days.  -     diclofenac EC (VOLTAREN) 75 mg EC tablet; take 1 tablet by mouth twice a day with food      Ice therapy 2-83mn tid daily, advise to do stretching x2 daily for 5-10 min, rom strengthening with resistance banding 3-4 times per week informations and how to do the strengthening printed and handed out to the patient, was also told to do some resting and avoid heavy lifting  and pushing, help with weight reduction, take meds w/ food and water, if develop abdominal upsets, heart burn and sour stomach. Please take some OTC antacids or Nexium 8mn before the first meal,   stool softner if opioid base meds given, in addition to avoid machinary operation and driving while on any medication that could potentially cause dizziness, drowsiness, and sleepiness such as any of the Narcotics,    Dependency and tolerancy were also addressed,  meds side effects and compliancy advised, rtc if worsens  Pt agreed with the recommendations  lab results and schedule of future lab studies reviewed with patient  reviewed diet, exercise and weight control  cardiovascular risk and specific lipid/LDL goals reviewed  reviewed medications and side effects in detail  use of aspirin to prevent MI and TIA's discussed

## 2014-05-07 NOTE — Patient Instructions (Signed)
Learning About Diabetes Food Guidelines  Your Care Instructions  Meal planning is important to manage diabetes. It helps keep your blood sugar at a target level (which you set with your doctor). You don't have to eat special foods. You can eat what your family eats, including sweets once in a while. But you do have to pay attention to how often you eat and how much you eat of certain foods.  You may want to work with a dietitian or a certified diabetes educator (CDE) to help you plan meals and snacks. A dietitian or CDE can also help you lose weight if that is one of your goals.  What should you know about eating carbs?  Managing the amount of carbohydrate (carbs) you eat is an important part of healthy meals when you have diabetes. Carbohydrate is found in many foods.  ?? Learn which foods have carbs. And learn the amounts of carbs in different foods.  ?? Bread, cereal, pasta, and rice have about 15 grams of carbs in a serving. A serving is 1 slice of bread (1 ounce), ?? cup of cooked cereal, or 1/3 cup of cooked pasta or rice.  ?? Fruits have 15 grams of carbs in a serving. A serving is 1 small fresh fruit, such as an apple or orange; ?? of a banana; ?? cup of cooked or canned fruit; ?? cup of fruit juice; 1 cup of melon or raspberries; or 2 tablespoons of dried fruit.  ?? Milk and no-sugar-added yogurt have 15 grams of carbs in a serving. A serving is 1 cup of milk or 2/3 cup of no-sugar-added yogurt.  ?? Starchy vegetables have 15 grams of carbs in a serving. A serving is ?? cup of mashed potatoes or sweet potato; 1 cup winter squash; ?? of a small baked potato; ?? cup of cooked beans; or ?? cup cooked corn or green peas.  ?? Learn how much carbs to eat each day and at each meal. A dietitian or CDE can teach you how to keep track of the amount of carbs you eat. This is called carbohydrate counting.  ?? If you are not sure how to count carbohydrate grams, use the Plate  Method to plan meals. It is a good, quick way to make sure that you have a balanced meal. It also helps you spread carbs throughout the day.  ?? Divide your plate by types of foods. Put non-starchy vegetables on half the plate, meat or other protein food on one-quarter of the plate, and a grain or starchy vegetable in the final quarter of the plate. To this you can add a small piece of fruit and 1 cup of milk or yogurt, depending on how many carbs you are supposed to eat at a meal.  ?? Try to eat about the same amount of carbs at each meal. Do not "save up" your daily allowance of carbs to eat at one meal.  ?? Proteins have very little or no carbs per serving. Examples of proteins are beef, chicken, turkey, fish, eggs, tofu, cheese, cottage cheese, and peanut butter. A serving size of meat is 3 ounces, which is about the size of a deck of cards. Examples of meat substitute serving sizes (equal to 1 ounce of meat) are 1/4 cup of cottage cheese, 1 egg, 1 tablespoon of peanut butter, and ?? cup of tofu.  How can you eat out and still eat healthy?  ?? Learn to estimate the serving sizes of foods that have   carbohydrate. If you measure food at home, it will be easier to estimate the amount in a serving of restaurant food.  ?? If the meal you order has too much carbohydrate (such as potatoes, corn, or baked beans), ask to have a low-carbohydrate food instead. Ask for a salad or green vegetables.  ?? If you use insulin, check your blood sugar before and after eating out to help you plan how much to eat in the future.  ?? If you eat more carbohydrate at a meal than you had planned, take a walk or do other exercise. This will help lower your blood sugar.  What else should you know?  ?? Limit saturated fat, such as the fat from meat and dairy products. This is a healthy choice because people who have diabetes are at higher risk of heart disease. So choose lean cuts of meat and nonfat or low-fat dairy  products. Use olive or canola oil instead of butter or shortening when cooking.  ?? Don't skip meals. Your blood sugar may drop too low if you skip meals and take insulin or certain medicines for diabetes.  ?? Check with your doctor before you drink alcohol. Alcohol can cause your blood sugar to drop too low. Alcohol can also cause a bad reaction if you take certain diabetes medicines.  Follow-up care is a key part of your treatment and safety. Be sure to make and go to all appointments, and call your doctor if you are having problems. It's also a good idea to know your test results and keep a list of the medicines you take.   Where can you learn more?   Go to http://www.healthwise.net/[facility]  Enter I147 in the search box to learn more about "Learning About Diabetes Food Guidelines."   ?? 2006-2015 [employer]. Care instructions adapted under license by [place] (which disclaims liability or warranty for this information). This care instruction is for use with your licensed healthcare professional. If you have questions about a medical condition or this instruction, always ask your healthcare professional. [employer], [employer] disclaims any warranty or liability for your use of this information.  Content Version: 10.7.482551; Current as of: Jul 25, 2013              Learning About High Blood Pressure  What is high blood pressure?     Blood pressure is a measure of how hard the blood pushes against the walls of your arteries. It's normal for blood pressure to go up and down throughout the day, but if it stays up, you have high blood pressure. Another name for high blood pressure is hypertension.  Two numbers tell you your blood pressure. The first number is the systolic pressure. It shows how hard the blood pushes when your heart is pumping. The second number is the diastolic pressure. It shows how hard the blood pushes between heartbeats, when your heart is relaxed and filling with blood.   A blood pressure of less than 120/80 (say "120 over 80") is ideal for an adult. High blood pressure is 140/90 or higher. Many people fall into the category in between, called prehypertension. People with prehypertension need to make lifestyle changes to bring their blood pressure down and help prevent or delay high blood pressure.  What happens when you have high blood pressure?  ?? Blood flows through your arteries with too much force. Over time, this damages the walls of your arteries. But you can't feel it. High blood pressure usually doesn't cause   symptoms.  ?? Fat and calcium start to build up in your arteries. This buildup is called plaque. Plaque makes your arteries narrower and stiffer. Blood can't flow through them as easily.  ?? This lack of good blood flow starts to damage some of the organs in your body. This can lead to problems such as coronary artery disease and heart attack, heart failure, stroke, kidney failure, and eye damage.  How can you prevent high blood pressure?  ?? Stay at a healthy weight.  ?? Try to limit how much sodium you eat to less than 2,300 milligrams (mg) a day. And try to limit the sodium you eat to less than 1,500 mg a day if you are 51 or older, are black, or have high blood pressure, diabetes, or chronic kidney disease.  ?? Buy foods that are labeled "unsalted," "sodium-free," or "low-sodium." Foods labeled "reduced-sodium" and "light sodium" may still have too much sodium.  ?? Flavor your food with garlic, lemon juice, onion, vinegar, herbs, and spices instead of salt. Do not use soy sauce, steak sauce, onion salt, garlic salt, mustard, or ketchup on your food.  ?? Use less salt (or none) when recipes call for it. You can often use half the salt a recipe calls for without losing flavor.  ?? Be physically active. Get at least 30 minutes of exercise on most days of the week. Walking is a good choice. You also may want to do other  activities, such as running, swimming, cycling, or playing tennis or team sports.  ?? Limit alcohol to 2 drinks a day for men and 1 drink a day for women.  ?? Eat plenty of fruits, vegetables, and low-fat dairy products. Eat less saturated and total fats.  How is high blood pressure treated?  ?? Your doctor will suggest making lifestyle changes. For example, your doctor may ask you to eat healthy foods, quit smoking, lose extra weight, and be more active.  ?? If lifestyle changes don't help enough or your blood pressure is very high, you will have to take medicine every day.  Follow-up care is a key part of your treatment and safety. Be sure to make and go to all appointments, and call your doctor if you are having problems. It's also a good idea to know your test results and keep a list of the medicines you take.   Where can you learn more?   Go to http://www.healthwise.net/[facility]  Enter P501 in the search box to learn more about "Learning About High Blood Pressure."   ?? 2006-2015 [employer]. Care instructions adapted under license by [employer] (which disclaims liability or warranty for this information). This care instruction is for use with your licensed healthcare professional. If you have questions about a medical condition or this instruction, always ask your healthcare professional. [employer], [employer] disclaims any warranty or liability for your use of this information.  Content Version: 10.7.482551; Current as of: April 25, 2013

## 2014-05-21 ENCOUNTER — Encounter: Attending: Family Medicine | Primary: Geriatric Medicine

## 2014-06-02 NOTE — Telephone Encounter (Signed)
Medical records faxed to Dr Ancil Boozereekah

## 2014-06-02 NOTE — Telephone Encounter (Signed)
Patient's Pulmonologist Dr. Ardean Larseneca's office needs to have the patient's most recent office notes & labs faxed to their office ASAP. Patient is being seen in the office today.    Dr. Ardean Larseneca's fax# [phone]  Office [phone]

## 2014-06-08 ENCOUNTER — Encounter

## 2014-06-08 ENCOUNTER — Inpatient Hospital Stay: Admit: 2014-06-08 | Payer: MEDICARE | Primary: Geriatric Medicine

## 2014-06-08 DIAGNOSIS — J449 Chronic obstructive pulmonary disease, unspecified: Secondary | ICD-10-CM

## 2014-06-10 ENCOUNTER — Inpatient Hospital Stay: Payer: MEDICARE | Attending: Family Medicine | Primary: Geriatric Medicine

## 2014-06-10 DIAGNOSIS — M899 Disorder of bone, unspecified: Secondary | ICD-10-CM

## 2014-06-16 MED ORDER — COMBIVENT RESPIMAT 20 MCG-100 MCG/ACTUATION SOLUTION FOR INHALATION
20-100 mcg/actuation | RESPIRATORY_TRACT | Status: DC
Start: 2014-06-16 — End: 2014-10-27

## 2014-06-18 ENCOUNTER — Encounter

## 2014-07-27 ENCOUNTER — Encounter

## 2014-07-29 ENCOUNTER — Encounter: Attending: Cardiovascular Disease | Primary: Geriatric Medicine

## 2014-07-29 LAB — AMB EXT HGBA1C: Hemoglobin A1c, External: 6.6

## 2014-07-29 LAB — AMB EXT LDL-C: LDL-C, External: 110

## 2014-07-29 LAB — AMB EXT CREATININE: Creatinine, External: 0.81

## 2014-08-05 ENCOUNTER — Encounter

## 2014-08-05 NOTE — Progress Notes (Signed)
Order placed for dexa scan per Verbal Order from Dr. Demetrios IsaacsAshrafi on 08/05/2014 due to need for new diagnosis code.

## 2014-08-06 ENCOUNTER — Ambulatory Visit: Payer: MEDICARE | Primary: Geriatric Medicine

## 2014-08-06 ENCOUNTER — Inpatient Hospital Stay: Admit: 2014-08-06 | Payer: MEDICARE | Attending: Family Medicine | Primary: Geriatric Medicine

## 2014-08-06 DIAGNOSIS — Z13820 Encounter for screening for osteoporosis: Secondary | ICD-10-CM

## 2014-08-12 MED ORDER — TRIAMTERENE-HYDROCHLOROTHIAZIDE 37.5 MG-25 MG CAP
ORAL_CAPSULE | ORAL | Status: DC
Start: 2014-08-12 — End: 2014-12-01

## 2014-08-13 ENCOUNTER — Encounter: Attending: Family Medicine | Primary: Geriatric Medicine

## 2014-08-13 ENCOUNTER — Inpatient Hospital Stay: Admit: 2014-08-27 | Payer: MEDICARE | Primary: Geriatric Medicine

## 2014-08-13 ENCOUNTER — Ambulatory Visit: Admit: 2014-08-13 | Discharge: 2014-08-13 | Payer: MEDICARE | Attending: Family Medicine | Primary: Geriatric Medicine

## 2014-08-13 DIAGNOSIS — E1165 Type 2 diabetes mellitus with hyperglycemia: Secondary | ICD-10-CM

## 2014-08-13 DIAGNOSIS — IMO0002 Reserved for concepts with insufficient information to code with codable children: Secondary | ICD-10-CM

## 2014-08-13 LAB — AMB POC URINALYSIS DIP STICK AUTO W/O MICRO
Blood (UA POC): NEGATIVE
Glucose (UA POC): NEGATIVE
Nitrites (UA POC): NEGATIVE
Specific gravity (UA POC): 1.02 (ref 1.001–1.035)
pH (UA POC): 5 (ref 4.6–8.0)

## 2014-08-13 LAB — AMB POC HEMOGLOBIN A1C: Hemoglobin A1c (POC): 6.3 %

## 2014-08-13 MED ORDER — CALCIUM CARBONATE-VITAMIN D3 600 MG-400 UNIT TAB
600 mg-10 mcg (400 unit) | ORAL_TABLET | Freq: Two times a day (BID) | ORAL | Status: DC
Start: 2014-08-13 — End: 2015-09-12

## 2014-08-13 MED ORDER — DICLOFENAC 1 % TOPICAL GEL
1 % | Freq: Four times a day (QID) | CUTANEOUS | Status: DC
Start: 2014-08-13 — End: 2016-04-19

## 2014-08-13 MED ORDER — POLYETHYLENE GLYCOL 3350 17 GRAM (100 %) ORAL POWDER PACKET
17 gram | PACK | Freq: Every day | ORAL | Status: AC
Start: 2014-08-13 — End: 2014-08-16

## 2014-08-13 MED ORDER — ZOSTER VACCINE LIVE (PF) 19,400 UNIT SUB-Q SOLN
19400 unit/0.65 mL | Freq: Once | SUBCUTANEOUS | Status: AC
Start: 2014-08-13 — End: 2014-08-13

## 2014-08-13 MED ORDER — PNEUMOCOCCAL 13-VAL CONJ VACCINE-DIP CRM (PF) 0.5 ML IM SYRINGE
0.5 mL | Freq: Once | INTRAMUSCULAR | Status: AC
Start: 2014-08-13 — End: 2014-08-13

## 2014-08-13 NOTE — Patient Instructions (Addendum)
Vaccine Information Statement     Tdap (Tetanus, Diphtheria, Pertussis) Vaccine: What You Need to Know    Many Vaccine Information Statements are available in Spanish and other languages. See www.immunize.org/vis.  Hojas de Informaci??n Sobre Vacunas est??n disponibles en espa??ol y en muchos otros idiomas. Visite http://www.immunize.org/vis    1. Why get vaccinated?    Tetanus, diphtheria, and pertussis are very serious diseases. Tdap vaccine can protect us from these diseases.  And, Tdap vaccine given to pregnant women can protect newborn babies against pertussis.    TETANUS (Lockjaw) is rare in the [place] today. It causes painful muscle tightening and stiffness, usually all over the body.  ? It can lead to tightening of muscles in the head and neck so you can???t open your mouth, swallow, or sometimes even breathe. Tetanus kills about 1 out of 10 people who are infected even after receiving the best medical care.      DIPHTHERIA is also rare in the [place] today.  It can cause a thick coating to form in the back of the throat.  ? It can lead to breathing problems, heart failure, paralysis, and death.    PERTUSSIS (Whooping Cough) causes severe coughing spells, which can cause difficulty breathing, vomiting, and disturbed sleep.  ? It can also lead to weight loss, incontinence, and rib fractures. Up to 2 in 100 adolescents and 5 in 100 adults with pertussis are hospitalized or have complications, which could include pneumonia or death.     These diseases are caused by bacteria. Diphtheria and pertussis are spread from person to person through secretions from coughing or sneezing. Tetanus enters the body through cuts, scratches, or wounds.    Before vaccines, as many as 200,000 cases of diphtheria, 200,000 cases of pertussis, and hundreds of cases of tetanus, were reported in the [place] each year. Since vaccination began, reports of cases for tetanus  and diphtheria have dropped by about 99% and for pertussis by about 80%.    2. Tdap vaccine    Tdap vaccine can protect adolescents and adults from tetanus, diphtheria, and pertussis. One dose of Tdap is routinely given at age 11 or 12.  People who did not get Tdap at that age should get it as soon as possible.    Tdap is especially important for health care professionals and anyone having close contact with a baby younger than 12 months.      Pregnant women should get a dose of Tdap during every pregnancy, to protect the newborn from pertussis.  Infants are most at risk for severe, life-threatening complications from pertussis.    Another vaccine, called Td, protects against tetanus and diphtheria, but not pertussis. A Td booster should be given every 10 years. Tdap may be given as one of these boosters if you have never gotten Tdap before.  Tdap may also be given after a severe cut or burn to prevent tetanus infection.    Your doctor or the person giving you the vaccine can give you more information.    Tdap may safely be given at the same time as other vaccines.    3. Some people should not get this vaccine    ??? A person who has ever had a life-threatening allergic reaction after a previous dose of any diphtheria, tetanus or pertussis containing vaccine, OR has a severe allergy to any part of this vaccine, should not get Tdap vaccine.  Tell the person giving the vaccine about any severe allergies.    ???   Anyone who had coma or long repeated seizures within 7 days after a childhood dose of DTP or DTaP, or a previous dose of Tdap, should not get Tdap, unless a cause other than the vaccine was found.  They can still get Td.    ??? Talk to your doctor if you:  - have seizures or another nervous system problem,  - had severe pain or swelling after any vaccine containing diphtheria, tetanus or pertussis,   - ever had a condition called Guillain Barr?? Syndrome (GBS),  - aren???t feeling well on the day the shot is scheduled.     4. Risks    With any medicine, including vaccines, there is a chance of side effects. These are usually mild and go away on their own. Serious reactions are also possible but are rare.     Most people who get Tdap vaccine do not have any problems with it.    Mild Problems following Tdap  (Did not interfere with activities)  ??? Pain where the shot was given (about 3 in 4 adolescents or 2 in 3 adults)  ??? Redness or swelling where the shot was given (about 1 person in 5)  ??? Mild fever of at least 100.4??F (up to about 1 in 25 adolescents or 1 in 100 adults)  ??? Headache (about 3 or 4 people in 10)  ??? Tiredness (about 1 person in 3 or 4)  ??? Nausea, vomiting, diarrhea, stomach ache (up to 1 in 4 adolescents or 1 in 10 adults)  ??? Chills,  sore joints (about 1 person in 10)  ??? Body aches (about 1 person in 3 or 4)   ??? Rash, swollen glands (uncommon)    Moderate Problems following Tdap  (Interfered with activities, but did not require medical attention)  ??? Pain where the shot was given (up to 1 in 5 or 6)   ??? Redness or swelling where the shot was given (up to about 1 in 16 adolescents or 1 in 12 adults)  ??? Fever over 102??F (about 1 in 100 adolescents or 1 in 250 adults)  ??? Headache (about 1 in 7 adolescents or 1 in 10 adults)  ??? Nausea, vomiting, diarrhea, stomach ache (up to 1 or 3 people in 100)  ??? Swelling of the entire arm where the shot was given (up to about 1 in 500).     Severe Problems following Tdap  (Unable to perform usual activities; required medical attention)  ??? Swelling, severe pain, bleeding, and redness in the arm where the shot was given (rare).    Problems that could happen after any vaccine:    ??? People sometimes faint after a medical procedure, including vaccination. Sitting or lying down for about 15 minutes can help prevent fainting, and injuries caused by a fall. Tell your doctor if you feel dizzy, or have vision changes or ringing in the ears.     ??? Some people get severe pain in the shoulder and have difficulty moving the arm where a shot was given. This happens very rarely.    ??? Any medication can cause a severe allergic reaction. Such reactions from a vaccine are very rare, estimated at fewer than 1 in a million doses, and would happen within a few minutes to a few hours after the vaccination.     As with any medicine, there is a very remote chance of a vaccine causing a serious injury or death.    The safety of vaccines is   always being monitored. For more information, visit: http://floyd.org/    5. What if there is a serious problem?    What should I look for?  ??? Look for anything that concerns you, such as signs of a severe allergic reaction, very high fever, or unusual behavior.    ??? Signs of a severe allergic reaction can include hives, swelling of the face and throat, difficulty breathing, a fast heartbeat, dizziness, and weakness. These would usually start a few minutes to a few hours after the vaccination.    What should I do?  ??? If you think it is a severe allergic reaction or other emergency that can???t wait, call 9-1-1 or get the person to the nearest hospital. Otherwise, call your doctor.    ??? Afterward, the reaction should be reported to the Vaccine Adverse Event Reporting System (VAERS). Your doctor might file this report, or you can do it yourself through the VAERS web site at www.vaers.LAgents.no, or by calling 1-386-370-6981.    VAERS does not give medical advice.    6. The National Vaccine Injury Compensation Program    The [employer] Vaccine Injury Compensation Program (VICP) is a federal program that was created to compensate people who may have been injured by certain vaccines.    Persons who believe they may have been injured by a vaccine can learn about the program and about filing a claim by calling 1-574 037 5629 or visiting the VICP website at SpiritualWord.at. There is a  time limit to file a claim for compensation.     7. How can I learn more?    ??? Ask your doctor. He or she can give you the vaccine package insert or suggest other sources of information.  ??? Call your local or state health department.  ??? Contact the Centers for Disease Control and Prevention (CDC):  - Call 878 514 0935 (1-800-CDC-INFO) or  - Visit CDC???s website at PicCapture.uy      Vaccine Information Statement   Tdap Vaccine  (04/29/2013)  [place] ?? [id number]    Department of Health and Insurance risk surveyor for Disease Control and Prevention    Office Use Only    Learning About Diabetes Food Guidelines  Your Care Instructions  Meal planning is important to manage diabetes. It helps keep your blood sugar at a target level (which you set with your doctor). You don't have to eat special foods. You can eat what your family eats, including sweets once in a while. But you do have to pay attention to how often you eat and how much you eat of certain foods.  You may want to work with a dietitian or a certified diabetes educator (CDE) to help you plan meals and snacks. A dietitian or CDE can also help you lose weight if that is one of your goals.  What should you know about eating carbs?  Managing the amount of carbohydrate (carbs) you eat is an important part of healthy meals when you have diabetes. Carbohydrate is found in many foods.  1. Learn which foods have carbs. And learn the amounts of carbs in different foods.  1. Bread, cereal, pasta, and rice have about 15 grams of carbs in a serving. A serving is 1 slice of bread (1 ounce), ?? cup of cooked cereal, or 1/3 cup of cooked pasta or rice.  2. Fruits have 15 grams of carbs in a serving. A serving is 1 small fresh fruit, such as an apple or orange; ?? of a banana; ??  cup of cooked or canned fruit; ?? cup of fruit juice; 1 cup of melon or raspberries; or 2 tablespoons of dried fruit.  3. Milk and no-sugar-added yogurt have 15 grams of carbs in a serving. A  serving is 1 cup of milk or 2/3 cup of no-sugar-added yogurt.  4. Starchy vegetables have 15 grams of carbs in a serving. A serving is ?? cup of mashed potatoes or sweet potato; 1 cup winter squash; ?? of a small baked potato; ?? cup of cooked beans; or ?? cup cooked corn or green peas.  2. Learn how much carbs to eat each day and at each meal. A dietitian or CDE can teach you how to keep track of the amount of carbs you eat. This is called carbohydrate counting.  3. If you are not sure how to count carbohydrate grams, use the Plate Method to plan meals. It is a good, quick way to make sure that you have a balanced meal. It also helps you spread carbs throughout the day.  1. Divide your plate by types of foods. Put non-starchy vegetables on half the plate, meat or other protein food on one-quarter of the plate, and a grain or starchy vegetable in the final quarter of the plate. To this you can add a small piece of fruit and 1 cup of milk or yogurt, depending on how many carbs you are supposed to eat at a meal.  4. Try to eat about the same amount of carbs at each meal. Do not "save up" your daily allowance of carbs to eat at one meal.  5. Proteins have very little or no carbs per serving. Examples of proteins are beef, chicken, [place], fish, eggs, tofu, cheese, cottage cheese, and peanut butter. A serving size of meat is 3 ounces, which is about the size of a deck of cards. Examples of meat substitute serving sizes (equal to 1 ounce of meat) are 1/4 cup of cottage cheese, 1 egg, 1 tablespoon of peanut butter, and ?? cup of tofu.  How can you eat out and still eat healthy?  1. Learn to estimate the serving sizes of foods that have carbohydrate. If you measure food at home, it will be easier to estimate the amount in a serving of restaurant food.  2. If the meal you order has too much carbohydrate (such as potatoes, corn, or baked beans), ask to have a low-carbohydrate food instead. Ask for a salad or green vegetables.   3. If you use insulin, check your blood sugar before and after eating out to help you plan how much to eat in the future.  4. If you eat more carbohydrate at a meal than you had planned, take a walk or do other exercise. This will help lower your blood sugar.  What else should you know?  1. Limit saturated fat, such as the fat from meat and dairy products. This is a healthy choice because people who have diabetes are at higher risk of heart disease. So choose lean cuts of meat and nonfat or low-fat dairy products. Use olive or canola oil instead of butter or shortening when cooking.  2. Don't skip meals. Your blood sugar may drop too low if you skip meals and take insulin or certain medicines for diabetes.  3. Check with your doctor before you drink alcohol. Alcohol can cause your blood sugar to drop too low. Alcohol can also cause a bad reaction if you take certain diabetes medicines.  Follow-up care  is a key part of your treatment and safety. Be sure to make and go to all appointments, and call your doctor if you are having problems. It's also a good idea to know your test results and keep a list of the medicines you take.  Where can you learn more?  Go to InsuranceStats.ca  Enter I147 in the search box to learn more about "Learning About Diabetes Food Guidelines."  ?? 2006-2016 [employer]. Care instructions adapted under license by [employer] (which disclaims liability or warranty for this information). This care instruction is for use with your licensed healthcare professional. If you have questions about a medical condition or this instruction, always ask your healthcare professional. [employer] Inc[employer] disclaims any warranty or liability for your use of this information.  Content Version: 10.9.538570; Current as of: Jul 25, 2013        Learning About High Blood Pressure  What is high blood pressure?      Blood pressure is a measure of how hard the blood pushes against the walls of your arteries. It's normal for blood pressure to go up and down throughout the day, but if it stays up, you have high blood pressure. Another name for high blood pressure is hypertension.  Two numbers tell you your blood pressure. The first number is the systolic pressure. It shows how hard the blood pushes when your heart is pumping. The second number is the diastolic pressure. It shows how hard the blood pushes between heartbeats, when your heart is relaxed and filling with blood.  A blood pressure of less than 120/80 (say "120 over 80") is ideal for an adult. High blood pressure is 140/90 or higher. You have high blood pressure if your top number is 140 or higher or your bottom number is 90 or higher, or both. Many people fall into the category in between, called prehypertension. People with prehypertension need to make lifestyle changes to bring their blood pressure down and help prevent or delay high blood pressure.  What happens when you have high blood pressure?  6. Blood flows through your arteries with too much force. Over time, this damages the walls of your arteries. But you can't feel it. High blood pressure usually doesn't cause symptoms.  7. Fat and calcium start to build up in your arteries. This buildup is called plaque. Plaque makes your arteries narrower and stiffer. Blood can't flow through them as easily.  8. This lack of good blood flow starts to damage some of the organs in your body. This can lead to problems such as coronary artery disease and heart attack, heart failure, stroke, kidney failure, and eye damage.  How can you prevent high blood pressure?  5. Stay at a healthy weight.  6. Try to limit how much sodium you eat to less than 2,300 milligrams (mg) a day. And try to limit the sodium you eat to less than 1,500 mg a day if you are 51 or older, are black, or have high blood pressure, diabetes, or  chronic kidney disease.  1. Buy foods that are labeled "unsalted," "sodium-free," or "low-sodium." Foods labeled "reduced-sodium" and "light sodium" may still have too much sodium.  2. Flavor your food with garlic, lemon juice, onion, vinegar, herbs, and spices instead of salt. Do not use soy sauce, steak sauce, onion salt, garlic salt, mustard, or ketchup on your food.  3. Use less salt (or none) when recipes call for it. You can often use half the salt  a recipe calls for without losing flavor.  7. Be physically active. Get at least 30 minutes of exercise on most days of the week. Walking is a good choice. You also may want to do other activities, such as running, swimming, cycling, or playing tennis or team sports.  8. Limit alcohol to 2 drinks a day for men and 1 drink a day for women.  9. Eat plenty of fruits, vegetables, and low-fat dairy products. Eat less saturated and total fats.  How is high blood pressure treated?  4. Your doctor will suggest making lifestyle changes. For example, your doctor may ask you to eat healthy foods, quit smoking, lose extra weight, and be more active.  5. If lifestyle changes don't help enough or your blood pressure is very high, you will have to take medicine every day.  Follow-up care is a key part of your treatment and safety. Be sure to make and go to all appointments, and call your doctor if you are having problems. It's also a good idea to know your test results and keep a list of the medicines you take.  Where can you learn more?  Go to InsuranceStats.ca  Enter P501 in the search box to learn more about "Learning About High Blood Pressure."  ?? 2006-2016 [employer]. Care instructions adapted under license by [employer] (which disclaims liability or warranty for this information). This care instruction is for use with your licensed healthcare professional. If you have questions about a medical condition  or this instruction, always ask your healthcare professional. [employer] disclaims any warranty or liability for your use of this information.  Content Version: 10.9.538570; Current as of: April 01, 2014        Body Mass Index: Care Instructions  Your Care Instructions     Body mass index (BMI) can help you see if your weight is raising your risk for health problems. It uses a formula to compare how much you weigh with how tall you are. A BMI between 18.5 and 24.9 is considered healthy. A BMI between 25 and 29.9 is considered overweight. A BMI of 30 or higher is considered obese.  If your BMI is in the normal range, it means that you have a lower risk for weight-related health problems. If your BMI is in the overweight or obese range, you may be at increased risk for weight-related health problems, such as high blood pressure, heart disease, stroke, arthritis or joint pain, and diabetes.  BMI is just one measure of your risk for weight-related health problems. You may be at higher risk for health problems if you are not active, you eat an unhealthy diet, or you drink too much alcohol or use tobacco products.  Follow-up care is a key part of your treatment and safety. Be sure to make and go to all appointments, and call your doctor if you are having problems. It's also a good idea to know your test results and keep a list of the medicines you take.  How can you care for yourself at home?  9. Practice healthy eating habits. This includes eating plenty of fruits, vegetables, whole grains, lean protein, and low-fat dairy.  10. Get at least 30 minutes of exercise 5 days a week or more. Brisk walking is a good choice. You also may want to do other activities, such as running, swimming, cycling, or playing tennis or team sports.  11. Do not smoke. Smoking can increase your risk for health problems. If  you need help quitting, talk to your doctor about stop-smoking programs  and medicines. These can increase your chances of quitting for good.  12. Limit alcohol to 2 drinks a day for men and 1 drink a day for women. Too much alcohol can cause health problems.  If you have a BMI higher than 25  10. Your doctor may do other tests to check your risk for weight-related health problems. This may include measuring the distance around your waist. A waist measurement of more than 40 inches in men or 35 inches in women can increase the risk of weight-related health problems.  11. Talk with your doctor about steps you can take to stay healthy or improve your health. You may need to make lifestyle changes to lose weight and stay healthy, such as changing your diet and getting regular exercise.  Where can you learn more?  Go to InsuranceStats.ca  Enter S176 in the search box to learn more about "Body Mass Index: Care Instructions."  ?? 2006-2016 [employer]. Care instructions adapted under license by [employer] (which disclaims liability or warranty for this information). This care instruction is for use with your licensed healthcare professional. If you have questions about a medical condition or this instruction, always ask your healthcare professional. [employer] disclaims any warranty or liability for your use of this information.  Content Version: 10.9.538570; Current as of: April 21, 2014        Diabetic Nephropathy: Care Instructions  Your Care Instructions  Finding out that your kidneys have been damaged can be very distressing. It may have taken you by surprise, since damage to kidneys usually does not cause symptoms early on. It is normal to feel upset and afraid.  Having diabetic nephropathy means that for some time you have had high blood sugar, which damages the kidneys. Healthy kidneys keep protein in your blood, where it belongs. Damaged kidneys do not work the way they  should. Your kidneys are letting protein pass into your urine. Sometimes diabetic kidney disease can lead to kidney failure.  Your doctor will tell you how you might be able to slow damage to your kidneys. In many cases, prompt and regular treatment can prevent kidney failure. You will need to take medicine and may need to make a number of changes in your normal routines. If you can keep your blood sugar and blood pressure under control and take certain medicines, you may reduce your chance of kidney failure.  Follow-up care is a key part of your treatment and safety. Be sure to make and go to all appointments, and call your doctor if you are having problems. It's also a good idea to know your test results and keep a list of the medicines you take.  How can you care for yourself at home?  13. Take your medicines exactly as prescribed. It is very important that you take your insulin or other diabetes medicine as your doctor tells you. Call your doctor if you think you are having a problem with your medicine.  14. Try to keep blood sugar in your target range.  1. Eat a variety of foods, with carbohydrate spread out in your meals. Your doctor may restrict your protein. A dietitian can help you plan meals.  2. If your doctor recommends it, get more exercise. Walking is a good choice. Bit by bit, increase the amount you walk every day. Try for at least 30 minutes on most days of the week.  3.  Check your blood sugar as often as your doctor recommends.  15. Take and record your blood pressure at home if your doctor tells you to. Learn the importance of the two measures of blood pressure (such as 130 over 80, or 130/80). To take your blood pressure at home:  1. Ask your doctor to check your blood pressure monitor. He or she can make sure that it is accurate and that the cuff fits you. Also ask your doctor to watch you to make sure that you are using it right.   2. Do not eat, use tobacco products, or use medicine known to raise blood pressure (such as some nasal decongestant sprays) before taking your blood pressure.  3. Avoid taking your blood pressure if you have just exercised or are nervous or upset. Rest at least 15 minutes before taking a reading.  16. Eat a low-salt diet to help keep your blood pressure in your target range.  17. Do not smoke. Smoking raises your risk of many health problems, including diabetic nephropathy. If you need help quitting, talk to your doctor about stop-smoking programs and medicines. These can increase your chances of quitting for good.  18. Do not take ibuprofen, naproxen, or similar medicines, unless your doctor tells you to. These medicines may make kidney problems worse.  When should you call for help?  Call 911 anytime you think you may need emergency care. For example, call if:  12. You passed out (lost consciousness).  Call your doctor now or seek immediate medical care if:  6. You have not urinated at all in the last 24 hours.  7. You have trouble urinating or can urinate only very small amounts.  8. You are confused or have trouble thinking clearly.  9. You are very thirsty, lightheaded, or dizzy, or you feel like you may pass out (lose consciousness).  10. You have a weak, fast heartbeat.  11. You have swelling in your hands or feet.  12. You have blood in your urine.  13. You are extremely tired or weak.  14. You have shortness of breath.  15. You have chest pain.  Watch closely for changes in your health, and be sure to contact your doctor if you have any problems.  Where can you learn more?  Go to [employer]  Enter P361 in the search box to learn more about "Diabetic Nephropathy: Care Instructions."  ?? 2006-2016 [employer]. Care instructions adapted under license by [employer] (which disclaims liability or warranty  for this information). This care instruction is for use with your licensed healthcare professional. If you have questions about a medical condition or this instruction, always ask your healthcare professional. [employer] disclaims any warranty or liability for your use of this information.  Content Version: 10.9.538570; Current as of: January 23, 2014        Learning About Chronic Kidney Disease and Potassium  What is potassium?  Potassium is a mineral. It helps keep the right mix of fluids in your body. It also helps your nerves, muscles, and heart work properly. Most people get enough potassium from the foods they eat.  How does chronic kidney disease affect potassium levels?  Healthy kidneys keep the right balance of minerals in your blood. This includes potassium.  If you have long-term (chronic) kidney disease, it is hard for your kidneys to control the amount of potassium in your blood. You may get too much potassium. This can be harmful.  In some cases,  other medicines may make your body get rid of too much potassium. If this happens, you may need to take a potassium supplement.  How can you manage your potassium level?  You can learn how much potassium is in certain foods. Then you can control how much potassium you get in your diet.  Your doctor or dietitian can help you plan a diet that gives you the right amount of potassium. There is no diet that is right for everyone. Your diet will be based on how well your kidneys are working and whether you are on dialysis.  Your diet may change as your disease changes. See your doctor for regular testing. Testing helps you know when to change your diet. Your doctor or dietitian can help you do this.  Changing your diet can be hard. You may have to give up many foods you like. But it is very important to make the recommended changes. They will help you stay healthy for as long as possible.  What foods and products have potassium?   You can control the amount of potassium in your diet if you know which foods are low or high in potassium.  Foods low in potassium  19. Blueberries and raspberries  20. White or brown rice, spaghetti, and macaroni  21. Cucumbers, radishes, and hummus  Foods high in potassium  13. Apricots, oranges, prunes, and bananas  14. Broccoli, spinach, and potatoes  15. Milk and yogurt  Other products that may have potassium  16. Diet or protein drinks and diet bars often have potassium. It is also in sports drinks, such as Gatorade. These are meant to replace potassium you lose during exercise.  17. Do not use a salt substitute or "lite" salt without talking to your doctor first. These often are very high in potassium.  18. Be sure to tell your doctor about any prescription or over-the-counter medicines you take. Some medicines can raise your level of potassium.  Where can you learn more?  Go to [employer]  Enter [phone] in the search box to learn more about "Learning About Chronic Kidney Disease and Potassium."  ?? 2006-2016 [employer]. Care instructions adapted under license by [employer] (which disclaims liability or warranty for this information). This care instruction is for use with your licensed healthcare professional. If you have questions about a medical condition or this instruction, always ask your healthcare professional. [employer] disclaims any warranty or liability for your use of this information.  Content Version: 10.9.538570; Current as of: January 23, 2014        Diet for Chronic Kidney Disease (Before Dialysis): Care Instructions  Your Care Instructions  When you have chronic kidney disease, you need to change your diet to avoid foods that make your kidneys worse. You may need to limit salt, fluids, and protein. You also may need to limit minerals such as potassium and phosphorus. A diet for chronic kidney disease takes planning. A  dietitian who specializes in kidney disease can help you plan meals that meet your needs.  These guidelines are for people who are not on dialysis. Talk with your doctor or dietitian to make sure your diet is right for your condition. Do not change your diet without talking to your doctor or dietitian.  Follow-up care is a key part of your treatment and safety. Be sure to make and go to all appointments, and call your doctor if you are having problems. It's also a good idea to know your test  results and keep a list of the medicines you take.  How can you care for yourself at home?  22. Work with your doctor or dietitian to create a food plan.  23. Do not skip meals or go for many hours without eating. If you do not feel very hungry, try to eat 4 or 5 small meals instead of 1 or 2 big meals.  24. If you have a hard time eating enough, talk to your doctor or dietitian about ways you can add calories to your diet.  25. Do not take any vitamins or minerals, supplements, or herbal products without talking to your doctor first.  26. Check with your doctor about whether it is safe for you to drink alcohol.  To get the right amount of protein  16. Ask your doctor or dietitian how much protein you can have each day. Most people with chronic kidney disease need to limit the amount of protein they eat. But you still need some protein to stay healthy.  17. Include all sources of protein in your daily protein count. Besides meat, poultry, fish, and eggs, protein is found in milk and milk products, beans and nuts, breads, cereals, and vegetables.  To limit salt  19. Read food labels on cans and food packages. The labels tell you how much sodium is in each serving. Make sure that you look at the serving size. If you eat more than the serving size, you will get more sodium than what is listed on the label.  20. Do not add salt to your food. Avoid foods that list salt, sodium, or monosodium glutamate (MSG) as an ingredient.   21. Buy foods that are labeled "no salt added," "sodium-free," or "low-sodium." Foods labeled "reduced-sodium" and "light sodium" may still have too much sodium.  22. Limit processed foods, fast food, and restaurant foods. These types of food are very high in sodium.  23. Avoid salted pretzels, chips, popcorn, and other salted snacks.  24. Avoid smoked, cured, salted, and canned meat, fish, and poultry. This includes ham, bacon, hot dogs, and luncheon meats.  25. You may use lemon, herbs, and spices to flavor your meals.  To limit potassium  1. Choose low-potassium fruits such as blueberries and raspberries.  2. Choose low-potassium vegetables such as cucumbers and radishes.  3. Do not use a salt substitute or lite salt unless your doctor says it is okay. Most salt substitutes and lite salts are high in potassium.  To limit phosphorus  1. Follow your food plan to know how much milk and milk products you can have.  2. Limit nuts, peanut butter, seeds, lentils, beans, organ meats, and sardines.  3. Avoid cola drinks.  4. Avoid bran breads or bran cereals.  If you need to limit fluids  1. Know how much fluid you can drink. Every day fill a pitcher with that amount of water. If you drink another fluid (such as coffee) that day, pour an equal amount of water out of the pitcher.  2. Count foods that are liquid at room temperature, such as gelatin dessert and ice cream, as fluids.  Where can you learn more?  Go to [employer]  Enter [phone] in the search box to learn more about "Diet for Chronic Kidney Disease (Before Dialysis): Care Instructions."  ?? 2006-2016 [employer]. Care instructions adapted under license by [employer] (which disclaims liability or warranty for this information). This care instruction is for use with your licensed healthcare professional.  If you have questions about a medical condition  or this instruction, always ask your healthcare professional. [employer] disclaims any warranty or liability for your use of this information.  Content Version: 10.9.538570; Current as of: January 23, 2014        Patellofemoral Pain Syndrome (Runner's Knee): Exercises  Your Care Instructions  Here are some examples of typical rehabilitation exercises for your condition. Start each exercise slowly. Ease off the exercise if you start to have pain.  Your doctor or physical therapist will tell you when you can start these exercises and which ones will work best for you.  How to do the exercises  Calf wall stretch    27. Stand facing a wall with your hands on the wall at about eye level. Put your affected leg about a step behind your other leg.  28. Keeping your back leg straight and your back heel on the floor, bend your front knee and gently bring your hip and chest toward the wall until you feel a stretch in the calf of your back leg.  29. Hold the stretch for at least 15 to 30 seconds.  30. Repeat 2 to 4 times.  31. Repeat steps 1 through 4, but this time keep your back knee bent.  Quadriceps stretch    18. If you are not steady on your feet, hold on to a chair, counter, or wall.  19. Bend your affected leg, and reach behind you to grab the front of your foot or ankle with the hand on the same side. For example, if you are stretching your right leg, use your right hand.  20. Keeping your knees next to each other, pull your foot toward your buttock until you feel a gentle stretch across the front of your hip and down the front of your thigh. Your knee should be pointed directly to the ground, and not out to the side.  21. Hold the stretch for at least 15 to 30 seconds.  22. Repeat 2 to 4 times.  Hamstring wall stretch    26. Lie on your back in a doorway, with your good leg through the open door.  27. Slide your affected leg up the wall to straighten your knee. You  should feel a gentle stretch down the back of your leg.  ?? Do not arch your back.  ?? Do not bend either knee.  ?? Keep one heel touching the floor and the other heel touching the wall. Do not point your toes.  28. Hold the stretch for at least 1 minute. Then over time, try to lengthen the time you hold the stretch to as long as 6 minutes.  29. Repeat 2 to 4 times.  If you do not have a place to do this exercise in a doorway, there is another way to do it:  4. Lie on your back, and bend your affected leg.  5. Loop a towel under the ball and toes of that foot, and hold the ends of the towel in your hands.  6. Straighten your knee, and slowly pull back on the towel. You should feel a gentle stretch down the back of your leg.  7. Hold the stretch for at least 15 to 30 seconds. Or even better, hold the stretch for 1 minute if you can.  8. Repeat 2 to 4 times.  Quad sets    5. Sit with your affected leg straight and supported on the floor or a firm bed.  Place a small, rolled-up towel under your affected knee. Your other leg should be bent, with that foot flat on the floor.  6. Tighten the thigh muscles of your affected leg by pressing the back of your knee down into the towel.  7. Hold for about 6 seconds, then rest for up to 10 seconds.  8. Repeat 8 to 12 times.  Straight-leg raises to the front    3. Lie on your back with your good knee bent so that your foot rests flat on the floor. Your affected leg should be straight. Make sure that your low back has a normal curve. You should be able to slip your hand in between the floor and the small of your back, with your palm touching the floor and your back touching the back of your hand.  4. Tighten the thigh muscles in your affected leg by pressing the back of your knee flat down to the floor. Hold your knee straight.  5. Keeping the thigh muscles tight and your leg straight, lift your affected leg up so that your heel is about 12 inches off the floor.   6. Hold for about 6 seconds, then lower your leg slowly. Rest for up to 10 seconds between repetitions.  7. Repeat 8 to 12 times.  Straight-leg raises to the back    1. Lie on your stomach, and lift your leg straight up behind you (toward the ceiling).  2. Lift your toes about 6 inches off the floor, hold for about 6 seconds, then lower slowly.  3. Do 8 to 12 repetitions.  Wall slide with ball squeeze    1. Stand with your back against a wall and with your feet about shoulder-width apart. Your feet should be about 12 inches away from the wall.  2. Put a ball about the size of a soccer ball between your knees. Then slowly slide down the wall until your knees are bent about 20 to 30 degrees.  3. Tighten your thigh muscles by squeezing the ball between your knees. Hold that position for about 10 seconds, then stop squeezing. Rest for up to 10 seconds between repetitions.  4. Repeat 8 to 12 times.  Follow-up care is a key part of your treatment and safety. Be sure to make and go to all appointments, and call your doctor if you are having problems. It's also a good idea to know your test results and keep a list of the medicines you take.  Where can you learn more?  Go to [employer]  Enter A404 in the search box to learn more about "Patellofemoral Pain Syndrome (Runner's Knee): Exercises."  ?? 2006-2016 [employer]. Care instructions adapted under license by [employer] (which disclaims liability or warranty for this information). This care instruction is for use with your licensed healthcare professional. If you have questions about a medical condition or this instruction, always ask your healthcare professional. [employer] disclaims any warranty or liability for your use of this information.  Content Version: 10.9.538570; Current as of: Jul 25, 2013

## 2014-08-13 NOTE — Addendum Note (Signed)
Addended by: Keyvon Herter, [place] L on: 08/13/2014 11:28 AM      Modules accepted: Orders

## 2014-08-13 NOTE — Progress Notes (Signed)
HISTORY OF PRESENT ILLNESS  Allison Newman is a 74 y.o. female.  HPI     DMtype II    Compliant w/ meds, on ACEI and statin daily aspirin, having no diabetic diet, and not doing much of daily exercise, obtains home glucose monitoring averaging  115-120's  . No Rf needed for today. Denies any tingling sensation, polyurea and polydipsia, last a1c was not at target 6.5%%    . Last podiatry visit 2015   And last eye exam was 2015  Last urine microalbumin 2015 and was abnormal  . Feeling better since the last visit.    Knee pain  [name]. This is a chronic problem. 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 The problem occurs constantly. The problem has changed and worsening since onset.  The quality of the pain is described as dull. The pain is at a severity of 8/10, she does not want to take any more of the opioid based meds but has no other choice    Abnl bone DEXA scan     Has had it recently done, currently on 50K of vitamin-d 2 once weekly and on no calcium      HTN  Today pt present for Bp check and the patient stating that so far there has been a Compliancy w/ the bp meds, having had the low salt diet and the patient hasnot  been active life style and doesnot do the daily walking because of her pain in the back and knees, last visit was 113/70 nicely controlled    Current Outpatient Prescriptions   Medication Sig Dispense Refill   ??? varicella zoster vacine live (ZOSTAVAX) 19,400 unit/0.65 mL susr injection 1 Vial by SubCUTAneous route once for 1 dose. 0.65 mL 0   ??? pneumococcal 13 val conj dip (PREVNAR-13) 0.5 mL syrg injection 0.5 mL by IntraMUSCular route once for 1 dose. 0.5 mL 0   ??? triamterene-hydrochlorothiazide (DYAZIDE) 37.5-25 mg per capsule take 1 capsule by mouth once daily 30 Cap 3    ??? COMBIVENT RESPIMAT 20-100 mcg/actuation inhaler inhale 1 puff by mouth every 6 hours 1 Inhaler 6   ??? simvastatin (ZOCOR) 40 mg tablet   0   ??? ergocalciferol (ERGOCALCIFEROL) 50,000 unit capsule   0   ??? HYDROcodone-acetaminophen (NORCO) 7.5-325 mg per tablet Take 1 Tab by mouth every eight (8) hours as needed for Pain. Max Daily Amount: 3 Tabs. 90 Tab 0   ??? HYDROcodone-acetaminophen (NORCO) 7.5-325 mg per tablet Take 1 Tab by mouth every eight (8) hours as needed for Pain. Max Daily Amount: 3 Tabs. 90 Tab 0   ??? diclofenac EC (VOLTAREN) 75 mg EC tablet take 1 tablet by mouth twice a day with food 60 Tab 3   ??? metoprolol (LOPRESSOR) 50 mg tablet take 1 tablet by mouth twice a day 60 Tab 7   ??? lisinopril (PRINIVIL, ZESTRIL) 10 mg tablet take 1 tablet by mouth once daily 30 Tab 6   ??? FLUZONE HIGH-DOSE 2015-16, PF, syrg injection   0   ??? sitaGLIPtin-metFORMIN (JANUMET) 50-500 mg per tablet Take 1 Tab by mouth two (2) times daily (with meals). 60 Tab 6   ??? simvastatin (ZOCOR) 20 mg tablet Take 1 Tab by mouth every other day. Take 0.5 tab every other day. 30 Tab 6   ???  omeprazole (PRILOSEC) 20 mg capsule take 1 capsule by mouth twice a day before meals 60 Cap 6   ??? FREESTYLE LANCETS 28 gauge misc   1   ??? methimazole (TAPAZOLE) 5 mg tablet Take 5 mg by mouth daily.     ??? glucose blood VI test strips (FREESTYLE LITE STRIPS) strip Test twice daily. 1 Package 11   ??? Lancets (FREESTYLE LANCETS) misc Test blood sugar 4 times a day 1 Package 11   ??? cyanocobalamin 1,000 mcg tablet Take 1,000 mcg by mouth daily.     ??? Cholecalciferol, Vitamin D3, 50,000 unit cap Take 1 Tab by mouth every Sunday.     ??? aspirin 81 mg tablet Take 81 mg by mouth.       No Known Allergies  Past Medical History   Diagnosis Date   ??? Hypertension    ??? Diabetes (HCC)    ??? Arthritis    ??? Gastrointestinal disorder      GERD   ??? 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    ??? Elevated BUN 09/13/2011   ??? Rash, skin 09/25/2012   ??? 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 Chronic right hip pain 03/17/2013   ??? Gallbladder polyp 04/08/2013   ??? Umbilical hernia 05/08/2013   ??? Abdominal pain 05/08/2013   ??? Hypercholesterolemia    ??? GERD (gastroesophageal reflux disease)    ??? 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 Microalbuminuria 08/29/2013   ??? Other unknown and unspecified cause of morbidity or mortality      hx of bronchitis   ??? Abnormal finding on EKG 11/17/2013     Past Surgical History   Procedure Laterality Date   ??? Hx orthopaedic  1998     knee replacement right   ??? Hx orthopaedic  2000     screw in foot left   ??? Hx other surgical       colonoscopy     Family History   Problem Relation Age of Onset   ??? Diabetes Mother    ??? Heart Disease Mother      rheumatic fever   ??? Cancer Father      lung   ??? Cancer Sister      colon & breast   ??? Cancer Brother      throat     History   Substance Use Topics   ??? Smoking status: Former Smoker -- 0.25 packs/day for 1 years     Types: Cigarettes     Quit date: 06/03/1991   ??? Smokeless tobacco: Never Used   ??? Alcohol Use: No      Lab Results  Component Value Date/Time   HEMOGLOBIN A1C 6.5 01/22/2014 08:40 AM   HEMOGLOBIN A1C 6.6 11/17/2013 09:01 AM   HEMOGLOBIN A1C 6.5 08/28/2012 10:56 AM   GLUCOSE 159 01/22/2014 08:40 AM   GLUCOSE (POC) 143 01/03/2008 09:21 AM   MICROALB/CREAT RATIO (UG/MG CREAT.) 8.0 08/20/2013 08:45 AM   LDL, CALCULATED 98 01/22/2014 08:40 AM   CREATININE (POC) 0.8 04/11/2013 11:28 AM   CREATININE 0.80 01/22/2014 08:40 AM      Lab Results  Component Value Date/Time   CHOLESTEROL, TOTAL 170 01/22/2014 08:40 AM   HDL CHOLESTEROL 53 01/22/2014 08:40 AM   LDL, CALCULATED 98 01/22/2014 08:40 AM   TRIGLYCERIDE 96 01/22/2014 08:40 AM       Lab Results  Component Value Date/Time  TSH 1.460 01/22/2014 08:40 AM   T4, TOTAL 8.9 06/27/2012 09:33 AM         Review of Systems   Constitutional: Negative for fever and chills.    HENT: Negative for ear pain and nosebleeds.    Eyes: Negative for blurred vision, pain and discharge.   Respiratory: Negative for shortness of breath.    Cardiovascular: Negative for chest pain and leg swelling.   Gastrointestinal: Negative for nausea, vomiting, diarrhea and constipation.   Genitourinary: Negative for frequency.   Musculoskeletal: Positive for myalgias and joint pain.   Skin: Negative for itching and rash.   Neurological: Negative for headaches.   Psychiatric/Behavioral: Negative for depression. The patient is not nervous/anxious.        Physical Exam   Constitutional: She is oriented to person, place, and time. She appears well-developed and well-nourished.   HENT:   Head: Normocephalic and atraumatic.   Eyes: Conjunctivae and EOM are normal.   Neck: Normal range of motion. Neck supple.   Cardiovascular: Normal rate, regular rhythm and normal heart sounds.    No murmur heard.  Pulmonary/Chest: Effort normal and breath sounds normal.   Abdominal: Soft. Bowel sounds are normal. She exhibits no distension.   Musculoskeletal: She exhibits tenderness. She exhibits no edema.        Right knee: She exhibits decreased range of motion. Tenderness found.        Left knee: She exhibits decreased range of motion. Tenderness found.   Neurological: She is alert and oriented to person, place, and time.   Skin: No erythema.   Psychiatric: Her behavior is normal.   Nursing note and vitals reviewed.      ASSESSMENT and PLAN  Denine was seen today for diabetes and hypertension.    Diagnoses and all orders for this visit:    Diabetes mellitus type II, uncontrolled (HCC)  Orders:  -     AMB POC HEMOGLOBIN A1C  -     CBC W/O DIFF  -     AMB POC URINALYSIS DIP STICK AUTO W/O MICRO  -     TSH 3RD GENERATION  -     LIPID PANEL  -     METABOLIC PANEL, COMPREHENSIVE  -     REFERRAL TO OPHTHALMOLOGY    Essential hypertension with goal blood pressure less than 130/80  Orders:  -     AMB POC HEMOGLOBIN A1C  -     CBC W/O DIFF   -     AMB POC URINALYSIS DIP STICK AUTO W/O MICRO  -     TSH 3RD GENERATION  -     LIPID PANEL  -     METABOLIC PANEL, COMPREHENSIVE    Encounter for immunization  Orders:  -     Tetanus, diphtheria toxoids and acellular pertussis (TDAP) vaccine, in individuals >=7 years, IM  -     varicella zoster vacine live (ZOSTAVAX) 19,400 unit/0.65 mL susr injection; 1 Vial by SubCUTAneous route once for 1 dose.  -     pneumococcal 13 val conj dip (PREVNAR-13) 0.5 mL syrg injection; 0.5 mL by IntraMUSCular route once for 1 dose.    Slow transit constipation  Orders:  -     polyethylene glycol (MIRALAX) 17 gram packet; Take 1 Packet by mouth daily for 3 days.    Chronic pain of left knee  Orders:  -     diclofenac (VOLTAREN) 1 % gel; Apply 2 g to affected area four (  4) times daily.  -     REFERRAL TO PHYSICAL THERAPY    Other orders  -     calcium-cholecalciferol, D3, (CALTRATE 600+D) tablet; Take 1 Tab by mouth two (2) times a day.      compliancy re-advsised, will repeat and reevaluate in 3 months for th pt better outcome, pt agreed with today's recommendations was told if not better or if trouble obtaining the medications because of the cost, not to hesitates to call for alternatives, or cll  Please continue with:   Potassium rich diet,  lab results and schedule of future lab studies reviewed with patient    Daily walking   Increase oral fluid intake 8-10 glasses of water  Low fat low cholestrole diet, reviewed diet, exercise and weight control  Low salt diet, cardiovascular risk and specific lipid/LDL goals reviewed  Continue with current meds, reviewed medications and side effects in detail  use of aspirin to prevent MI and TIA's discussed    radiology results and schedule of future radiology studies reviewed with patient

## 2014-08-13 NOTE — Progress Notes (Addendum)
Chief Complaint   Patient presents with   ??? Diabetes     3 month f/u   ??? Hypertension     3 month f/u       Patient stated blood sugar ranges 118-125.

## 2014-08-14 LAB — METABOLIC PANEL, COMPREHENSIVE
A-G Ratio: 1.4 (ref 1.1–2.5)
ALT (SGPT): 10 IU/L (ref 0–32)
AST (SGOT): 9 IU/L (ref 0–40)
Albumin: 3.8 g/dL (ref 3.5–4.8)
Alk. phosphatase: 57 IU/L (ref 39–117)
BUN/Creatinine ratio: 30 — ABNORMAL HIGH (ref 11–26)
BUN: 36 mg/dL — ABNORMAL HIGH (ref 8–27)
Bilirubin, total: 0.2 mg/dL (ref 0.0–1.2)
CO2: 24 mmol/L (ref 18–29)
Calcium: 9.3 mg/dL (ref 8.7–10.3)
Chloride: 106 mmol/L (ref 97–108)
Creatinine: 1.2 mg/dL — ABNORMAL HIGH (ref 0.57–1.00)
GFR est AA: 52 mL/min/{1.73_m2} — ABNORMAL LOW (ref >59)
GFR est non-AA: 45 mL/min/{1.73_m2} — ABNORMAL LOW (ref >59)
GLOBULIN, TOTAL: 2.8 g/dL (ref 1.5–4.5)
Glucose: 164 mg/dL — ABNORMAL HIGH (ref 65–99)
Potassium: 4.5 mmol/L (ref 3.5–5.2)
Protein, total: 6.6 g/dL (ref 6.0–8.5)
Sodium: 146 mmol/L — ABNORMAL HIGH (ref 134–144)

## 2014-08-14 LAB — CBC W/O DIFF
HCT: 37.3 % (ref 34.0–46.6)
HGB: 12 g/dL (ref 11.1–15.9)
MCH: 28.7 pg (ref 26.6–33.0)
MCHC: 32.2 g/dL (ref 31.5–35.7)
MCV: 89 fL (ref 79–97)
PLATELET: 166 10*3/uL (ref 150–379)
RBC: 4.18 x10E6/uL (ref 3.77–5.28)
RDW: 14.5 % (ref 12.3–15.4)
WBC: 6 10*3/uL (ref 3.4–10.8)

## 2014-08-14 LAB — LIPID PANEL
Cholesterol, total: 170 mg/dL (ref 100–199)
HDL Cholesterol: 51 mg/dL (ref >39)
LDL, calculated: 94 mg/dL (ref 0–99)
Triglyceride: 124 mg/dL (ref 0–149)
VLDL, calculated: 25 mg/dL (ref 5–40)

## 2014-08-14 LAB — TSH 3RD GENERATION: TSH: 1.88 u[IU]/mL (ref 0.450–4.500)

## 2014-08-15 LAB — CULTURE, URINE

## 2014-08-18 MED ORDER — JANUMET 50 MG-500 MG TABLET
50-500 mg | ORAL_TABLET | ORAL | Status: DC
Start: 2014-08-18 — End: 2015-03-30

## 2014-09-01 ENCOUNTER — Inpatient Hospital Stay: Admit: 2014-09-01 | Payer: MEDICARE | Attending: Pulmonary Disease | Primary: Geriatric Medicine

## 2014-09-01 ENCOUNTER — Encounter: Attending: Cardiovascular Disease | Primary: Geriatric Medicine

## 2014-09-01 DIAGNOSIS — R0600 Dyspnea, unspecified: Secondary | ICD-10-CM

## 2014-09-04 NOTE — Procedures (Signed)
Name:       Allison Newman, Allison Newman                 Study Date:  09/01/2014                                               Ordered By:  Account #:  [id number]                     Sex:         F  DOB:        [birth year]     Age:   [age]        Location:                                  PULMONARY TEST       CLINICAL DIAGNOSIS/INDICATIONS:  Dyspnea.    INTERPRETATION:  Spirometry and lung volumes were performed and they  revealed    1. Severe air flow obstruction.  2. Moderate restrictive lung disease.  3. Moderate reduction in DLCO.  4. Significant improvement in FEV1 after bronchodilators.  5. 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  6. 6-minute walk study reveled no evidence of oxygen desaturation, O2 sat  at peak 6 minutes was 97% on room air.            Vickki MuffErnesto R Keithon Mccoin, MD    cc:   Vickki MuffErnesto R Devean Skoczylas, MD        Dalbert GarnetGeorge A Teekah, MD      ERG/wmx; D: 09/04/2014 11:22 A; T: 09/04/2014 11:31 A; DOC# [id number]; Job#  [id number]

## 2014-09-04 NOTE — Procedures (Signed)
Name:       Scaff, Dmiya C                 Study Date:  09/01/2014                                               Ordered By:  Account #:  [id number]                     Sex:         F  DOB:        07/15/1940     Age:   74        Location:                                  PULMONARY TEST       CLINICAL DIAGNOSIS/INDICATIONS:  Dyspnea.    INTERPRETATION:  Spirometry and lung volumes were performed and they  revealed    1. Severe air flow obstruction.  2. Moderate restrictive lung disease.  3. Moderate reduction in DLCO.  4. Significant improvement in FEV1 after bronchodilators.  5. 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  6. 6-minute walk study reveled no evidence of oxygen desaturation, O2 sat  at peak 6 minutes was 97% on room air.            Edwinna Rochette R Raegyn Renda, MD    cc:   Graci Hulce R Tashi Andujo, MD        George A Teekah, MD      ERG/wmx; D: 09/04/2014 11:22 A; T: 09/04/2014 11:31 A; DOC# [id number]; Job#  [id number]

## 2014-09-22 ENCOUNTER — Other Ambulatory Visit: Payer: Self-pay

## 2014-09-22 DIAGNOSIS — Z1231 Encounter for screening mammogram for malignant neoplasm of breast: Secondary | ICD-10-CM

## 2014-09-23 ENCOUNTER — Ambulatory Visit
Admission: RE | Admit: 2014-09-23 | Discharge: 2014-09-23 | Disposition: A | Discharge status: Home / Self Care | Payer: Medicare PPO | Source: Ambulatory Visit

## 2014-09-23 DIAGNOSIS — Z1231 Encounter for screening mammogram for malignant neoplasm of breast: Secondary | ICD-10-CM

## 2014-10-03 MED ORDER — LISINOPRIL 10 MG TAB
10 mg | ORAL_TABLET | ORAL | Status: DC
Start: 2014-10-03 — End: 2016-08-10

## 2014-10-07 MED ORDER — LISINOPRIL 10 MG TAB
10 mg | ORAL_TABLET | ORAL | Status: DC
Start: 2014-10-07 — End: 2014-10-27

## 2014-10-27 ENCOUNTER — Ambulatory Visit: Admit: 2014-10-27 | Discharge: 2014-10-27 | Payer: MEDICARE | Attending: Family Medicine | Primary: Geriatric Medicine

## 2014-10-27 ENCOUNTER — Encounter: Admit: 2014-10-27 | Primary: Geriatric Medicine

## 2014-10-27 DIAGNOSIS — J208 Acute bronchitis due to other specified organisms: Secondary | ICD-10-CM

## 2014-10-27 MED ORDER — PROMETHAZINE-CODEINE 6.25 MG-10 MG/5 ML SYRUP
Freq: Four times a day (QID) | ORAL | 0 refills | Status: DC | PRN
Start: 2014-10-27 — End: 2016-04-19

## 2014-10-27 MED ORDER — CEFTRIAXONE 500 MG SOLUTION FOR INJECTION
500 mg | Freq: Once | INTRAMUSCULAR | 0 refills | Status: AC
Start: 2014-10-27 — End: 2014-10-27

## 2014-10-27 MED ORDER — IPRATROPIUM 20 MCG-ALBUTEROL 100 MCG/ACTUATION MIST FOR INHALATION
20-100 mcg/actuation | RESPIRATORY_TRACT | 6 refills | Status: DC
Start: 2014-10-27 — End: 2015-05-05

## 2014-10-27 MED ORDER — LEVOFLOXACIN 500 MG TAB
500 mg | ORAL_TABLET | Freq: Every day | ORAL | 0 refills | Status: AC
Start: 2014-10-27 — End: 2014-11-06

## 2014-10-27 NOTE — Telephone Encounter (Signed)
Pain management list mailed to patient.

## 2014-10-27 NOTE — Progress Notes (Signed)
HISTORY OF PRESENT ILLNESS  Allison Newman is a 74 y.o. female.  HPI   Upper respiratory problem    Started >9 days ago not better,   otc not helping, have a very bad Sore throat, with a lot of painful Cough which are Productive yellowish , no hx of asthma , there has been a lot of decrease in the patient's sleep pattern, in addition there has been some muscle ache responding to OTC , no diarhea, no ear ache,also there has been a decrease in the appetite, has been had an exposure to sick person, fortunately an XXX smoker      Current Outpatient Prescriptions   Medication Sig Dispense Refill   ??? diclofenac EC (VOLTAREN) 75 mg EC tablet   0   ??? COL-RITE 100 mg capsule   0   ??? lisinopril (PRINIVIL, ZESTRIL) 10 mg tablet take 1 tablet by mouth once daily 30 Tab 6   ??? JANUMET 50-500 mg per tablet take 1 tablet by mouth twice a day with meals 60 Tab 6   ??? diclofenac (VOLTAREN) 1 % gel Apply 2 g to affected area four (4) times daily. 100 g 6   ??? calcium-cholecalciferol, D3, (CALTRATE 600+D) tablet Take 1 Tab by mouth two (2) times a day. 60 Tab 11   ??? triamterene-hydrochlorothiazide (DYAZIDE) 37.5-25 mg per capsule take 1 capsule by mouth once daily 30 Cap 3   ??? COMBIVENT RESPIMAT 20-100 mcg/actuation inhaler inhale 1 puff by mouth every 6 hours 1 Inhaler 6   ??? HYDROcodone-acetaminophen (NORCO) 7.5-325 mg per tablet Take 1 Tab by mouth every eight (8) hours as needed for Pain. Max Daily Amount: 3 Tabs. 90 Tab 0   ??? metoprolol (LOPRESSOR) 50 mg tablet take 1 tablet by mouth twice a day 60 Tab 7   ??? FLUZONE HIGH-DOSE 2015-16, PF, syrg injection   0   ??? simvastatin (ZOCOR) 20 mg tablet Take 1 Tab by mouth every other day. Take 0.5 tab every other day. 30 Tab 6   ??? omeprazole (PRILOSEC) 20 mg capsule take 1 capsule by mouth twice a day before meals 60 Cap 6   ??? FREESTYLE LANCETS 28 gauge misc   1   ??? methimazole (TAPAZOLE) 5 mg tablet Take 5 mg by mouth daily.      ??? glucose blood VI test strips (FREESTYLE LITE STRIPS) strip Test twice daily. 1 Package 11   ??? Lancets (FREESTYLE LANCETS) misc Test blood sugar 4 times a day 1 Package 11   ??? cyanocobalamin 1,000 mcg tablet Take 1,000 mcg by mouth daily.     ??? aspirin 81 mg tablet Take 81 mg by mouth.       No Known Allergies  Past Medical History   Diagnosis Date   ??? Abdominal pain 05/08/2013   ??? Abnormal finding on EKG 11/17/2013   ??? Arthritis    ??? 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 Chronic pain of left knee 08/13/2014   ??? Chronic right hip pain 03/17/2013   ??? Diabetes (HCC)    ??? Elevated BUN 09/13/2011   ??? Gallbladder polyp 04/08/2013   ??? Gastrointestinal disorder      GERD   ??? GERD (gastroesophageal reflux disease)    ??? Hypercholesterolemia    ??? Hypertension    ??? 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 Microalbuminuria 08/29/2013   ??? 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 Neuropathic arthritis  08/28/2011   ??? Other unknown and unspecified cause of morbidity or mortality      hx of bronchitis   ??? Rash, skin 09/25/2012   ??? Slow transit constipation 08/13/2014   ??? Umbilical hernia 05/08/2013     Past Surgical History   Procedure Laterality Date   ??? Hx orthopaedic  1998     knee replacement right   ??? Hx orthopaedic  2000     screw in foot left   ??? Hx other surgical       colonoscopy     Family History   Problem Relation Age of Onset   ??? Diabetes Mother    ??? Heart Disease Mother      rheumatic fever   ??? Cancer Father      lung   ??? Cancer Sister      colon & breast   ??? Cancer Brother      throat     Social History   Substance Use Topics   ??? Smoking status: Former Smoker     Packs/day: 0.25     Years: 1.00     Types: Cigarettes     Quit date: 06/03/1991   ??? Smokeless tobacco: Never Used   ??? Alcohol use No      Lab Results  Component Value Date/Time   HEMOGLOBIN A1C 6.5 01/22/2014 08:40 AM   HEMOGLOBIN A1C 6.6 11/17/2013 09:01 AM   HEMOGLOBIN A1C 6.5 08/28/2012 10:56 AM    HEMOGLOBIN A1C, EXTERNAL 6.6 07/29/2014   GLUCOSE 164 08/13/2014 08:15 AM   GLUCOSE (POC) 143 01/03/2008 09:21 AM   MICROALB/CREAT RATIO (UG/MG CREAT.) 8.0 08/20/2013 08:45 AM   LDL, CALCULATED 94 08/13/2014 08:15 AM   CREATININE (POC) 0.8 04/11/2013 11:28 AM   CREATININE 1.20 08/13/2014 08:15 AM         Review of Systems   Constitutional: Positive for chills, fever and malaise/fatigue.   HENT: Positive for congestion and sore throat. Negative for ear pain and nosebleeds.    Eyes: Negative for blurred vision, pain and discharge.   Respiratory: Positive for cough and sputum production. Negative for shortness of breath.    Cardiovascular: Negative for chest pain and leg swelling.   Gastrointestinal: Negative for constipation, diarrhea, nausea and vomiting.   Genitourinary: Negative for frequency.   Musculoskeletal: Negative for joint pain.   Skin: Negative for itching and rash.   Neurological: Positive for headaches.   Psychiatric/Behavioral: Negative for depression. The patient is not nervous/anxious.        Physical Exam   Constitutional: She is oriented to person, place, and time. She appears well-developed and well-nourished.   HENT:   Head: Normocephalic and atraumatic.   Nose: Mucosal edema and rhinorrhea present.   Mouth/Throat: Mucous membranes are dry. Posterior oropharyngeal edema and posterior oropharyngeal erythema present.   Eyes: Conjunctivae and EOM are normal.   Neck: Normal range of motion. Neck supple. No JVD present.   Cardiovascular: Normal rate, regular rhythm and normal heart sounds.    No murmur heard.  Pulmonary/Chest: Effort normal and breath sounds normal. No stridor.   Abdominal: Soft. Bowel sounds are normal. She exhibits no distension and no mass. There is no tenderness.   Musculoskeletal: Normal range of motion. She exhibits no edema.   Nl pulses, nl visual inspection nl monoF, +dystrophy and elongated Nails   Lymphadenopathy:     She has cervical adenopathy.    Neurological: She is alert and oriented to person, place, and time.  Skin: No erythema.   Psychiatric: Her behavior is normal.   Nursing note and vitals reviewed.      ASSESSMENT and PLAN  [place] was seen today for cold symptoms.    Diagnoses and all orders for this visit:    Acute bronchitis due to other specified organisms  -     REFERRAL TO OPHTHALMOLOGY  -     XR CHEST PA LAT; Future  -     CEFTRIAXONE SODIUM INJECTION PER 250 MG (Qty 2 for 500 mg)  -     THER/PROPH/DIAG INJECTION, SUBCUT/IM    Diabetes mellitus type II, uncontrolled (HCC)  -     HM DIABETES FOOT EXAM    Other orders  -     levofloxacin (LEVAQUIN) 500 mg tablet; Take 1 Tab by mouth daily for 10 days.  -     cefTRIAXone (ROCEPHIN) 500 mg injection; 500 mg by IntraMUSCular route once for 1 dose.  -     ipratropium-albuterol (COMBIVENT RESPIMAT) 20-100 mcg/actuation inhaler; inhale 1 puff by mouth every 6 hours  -     promethazine-codeine (PHENERGAN WITH CODEINE) 6.25-10 mg/5 mL syrup; Take 5 mL by mouth four (4) times daily as needed for Cough. Max Daily Amount: 20 mL. Indications: COUGH      Salt gurgle, incr po fluid intake, avoid cigs and 2nd hand exposure, rts if worsens, tylenol otc for pain, advised on meds side effects and compliance, hand washing and hygiene advised  Pt agreed  radiology results and schedule of future radiology studies reviewed with patient

## 2014-10-27 NOTE — Progress Notes (Signed)
Chief Complaint   Patient presents with   ??? Cold Symptoms     x1 week     Pt c/o chest congestion, productive cough (dark green), headache, sweating.

## 2014-10-28 NOTE — Addendum Note (Signed)
Addended by: Nehemiah Massed on: 10/28/2014 08:53 AM      Modules accepted: Level of Service

## 2014-11-03 MED ORDER — DICLOFENAC 75 MG TAB, DELAYED RELEASE
75 mg | ORAL_TABLET | ORAL | 3 refills | Status: DC
Start: 2014-11-03 — End: 2015-04-23

## 2014-11-16 ENCOUNTER — Encounter

## 2014-11-24 NOTE — Telephone Encounter (Signed)
Pt requests  xrays & any MRI results for pain management MD     Appt is 11-30-14       Best number to reach her is [phone]

## 2014-11-24 NOTE — Telephone Encounter (Signed)
Xray results sent to Dr Rebecka Apley office as requested.

## 2014-12-02 MED ORDER — TRIAMTERENE-HYDROCHLOROTHIAZIDE 37.5 MG-25 MG CAP
ORAL_CAPSULE | ORAL | 3 refills | Status: DC
Start: 2014-12-02 — End: 2015-04-03

## 2014-12-02 MED ORDER — METOPROLOL TARTRATE 50 MG TAB
50 mg | ORAL_TABLET | ORAL | 7 refills | Status: DC
Start: 2014-12-02 — End: 2015-08-24

## 2014-12-22 ENCOUNTER — Inpatient Hospital Stay: Admit: 2014-12-22 | Payer: MEDICARE | Attending: Family Medicine | Primary: Geriatric Medicine

## 2014-12-22 DIAGNOSIS — Z1231 Encounter for screening mammogram for malignant neoplasm of breast: Secondary | ICD-10-CM

## 2015-02-17 MED ORDER — SIMVASTATIN 20 MG TAB
20 mg | ORAL_TABLET | ORAL | 6 refills | Status: DC
Start: 2015-02-17 — End: 2016-01-14

## 2015-02-22 ENCOUNTER — Encounter: Attending: Family Medicine | Primary: Geriatric Medicine

## 2015-03-03 MED ORDER — COMBIVENT RESPIMAT 20 MCG-100 MCG/ACTUATION SOLUTION FOR INHALATION
20-100 mcg/actuation | RESPIRATORY_TRACT | 6 refills | Status: DC
Start: 2015-03-03 — End: 2015-05-05

## 2015-03-24 ENCOUNTER — Ambulatory Visit: Admit: 2015-03-24 | Discharge: 2015-03-24 | Payer: MEDICARE | Attending: Family Medicine | Primary: Geriatric Medicine

## 2015-03-24 ENCOUNTER — Inpatient Hospital Stay: Admit: 2015-05-11 | Payer: MEDICARE | Primary: Geriatric Medicine

## 2015-03-24 DIAGNOSIS — E119 Type 2 diabetes mellitus without complications: Secondary | ICD-10-CM

## 2015-03-24 NOTE — Addendum Note (Signed)
Addended by: Yevonne Pax on: 03/24/2015 04:37 PM      Modules accepted: Orders

## 2015-03-24 NOTE — Progress Notes (Signed)
Langston Masker      Name and DOB verified      Chief Complaint   Patient presents with   ??? Hypertension   ??? Diabetes         Health Maintenance reviewed-discussed with patient.

## 2015-03-24 NOTE — Progress Notes (Signed)
HISTORY OF PRESENT ILLNESS  Allison Newman is a 75 y.o. female.  HPI   DMtype II    Compliant w/ meds, currently on statin and ACei, having no diabetic diet, and not doing much of daily exercise, obtains home glucose monitoring averaging  131's,   At this visit no Rf needed, today pt  Denies any tingling sensation, polyurea and polydipsia,   And her last a1c was at target 6.3%%    . Last podiatry visit 2015   And last eye exam was 2015  Last urine microalbumin 2015 and was abnormal  .   Overall pt stating that she is Feeling better since the last visit.    Current Outpatient Prescriptions   Medication Sig Dispense Refill   ??? oxyCODONE-acetaminophen (PERCOCET 10) 10-325 mg per tablet take 1/2 TO 1 tablet once daily if needed for pain  0   ??? COMBIVENT RESPIMAT 20-100 mcg/actuation inhaler inhale 1 puff by mouth every 6 hours 4 Inhaler 6   ??? simvastatin (ZOCOR) 20 mg tablet take 1 tablet by mouth every other day then take 1/2 ALL OTHER DAYS 30 Tab 6   ??? metoprolol tartrate (LOPRESSOR) 50 mg tablet take 1 tablet by mouth twice a day 60 Tab 7   ??? triamterene-hydrochlorothiazide (DYAZIDE) 37.5-25 mg per capsule take 1 capsule by mouth once daily 30 Cap 3   ??? diclofenac EC (VOLTAREN) 75 mg EC tablet take 1 tablet by mouth twice a day with food 60 Tab 3   ??? diclofenac EC (VOLTAREN) 75 mg EC tablet   0   ??? COL-RITE 100 mg capsule   0   ??? ipratropium-albuterol (COMBIVENT RESPIMAT) 20-100 mcg/actuation inhaler inhale 1 puff by mouth every 6 hours 1 Inhaler 6   ??? promethazine-codeine (PHENERGAN WITH CODEINE) 6.25-10 mg/5 mL syrup Take 5 mL by mouth four (4) times daily as needed for Cough. Max Daily Amount: 20 mL. Indications: COUGH 120 mL 0   ??? lisinopril (PRINIVIL, ZESTRIL) 10 mg tablet take 1 tablet by mouth once daily 30 Tab 6   ??? JANUMET 50-500 mg per tablet take 1 tablet by mouth twice a day with meals 60 Tab 6   ??? diclofenac (VOLTAREN) 1 % gel Apply 2 g to affected area four (4) times daily. 100 g 6    ??? calcium-cholecalciferol, D3, (CALTRATE 600+D) tablet Take 1 Tab by mouth two (2) times a day. 60 Tab 11   ??? HYDROcodone-acetaminophen (NORCO) 7.5-325 mg per tablet Take 1 Tab by mouth every eight (8) hours as needed for Pain. Max Daily Amount: 3 Tabs. 90 Tab 0   ??? FLUZONE HIGH-DOSE 2015-16, PF, syrg injection   0   ??? omeprazole (PRILOSEC) 20 mg capsule take 1 capsule by mouth twice a day before meals 60 Cap 6   ??? FREESTYLE LANCETS 28 gauge misc   1   ??? methimazole (TAPAZOLE) 5 mg tablet Take 5 mg by mouth daily.     ??? glucose blood VI test strips (FREESTYLE LITE STRIPS) strip Test twice daily. 1 Package 11   ??? Lancets (FREESTYLE LANCETS) misc Test blood sugar 4 times a day 1 Package 11   ??? cyanocobalamin 1,000 mcg tablet Take 1,000 mcg by mouth daily.     ??? aspirin 81 mg tablet Take 81 mg by mouth.       No Known Allergies  Past Medical History   Diagnosis Date   ??? Abdominal pain 05/08/2013   ??? Abnormal finding on EKG 11/17/2013   ???  Arthritis    ??? 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 Chronic pain of left knee 08/13/2014   ??? Chronic right hip pain 03/17/2013   ??? Diabetes (HCC)    ??? Elevated BUN 09/13/2011   ??? Gallbladder polyp 04/08/2013   ??? Gastrointestinal disorder      GERD   ??? GERD (gastroesophageal reflux disease)    ??? Hypercholesterolemia    ??? Hypertension    ??? 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 Microalbuminuria 08/29/2013   ??? 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 Other unknown and unspecified cause of morbidity or mortality      hx of bronchitis   ??? Rash, skin 09/25/2012   ??? Slow transit constipation 08/13/2014   ??? Umbilical hernia 05/08/2013     Past Surgical History   Procedure Laterality Date   ??? Hx orthopaedic  1998     knee replacement right   ??? Hx orthopaedic  2000     screw in foot left   ??? Hx other surgical       colonoscopy     Family History   Problem Relation Age of Onset   ??? Diabetes Mother     ??? Heart Disease Mother      rheumatic fever   ??? Breast Cancer Mother    ??? Cancer Father      lung   ??? Cancer Sister      colon & breast   ??? Breast Cancer Sister    ??? Cancer Brother      throat     Social History   Substance Use Topics   ??? Smoking status: Former Smoker     Packs/day: 0.25     Years: 1.00     Types: Cigarettes     Quit date: 06/03/1991   ??? Smokeless tobacco: Never Used   ??? Alcohol use No      Lab Results  Component Value Date/Time   WBC 6.0 08/13/2014 08:15 AM   HGB 12.0 08/13/2014 08:15 AM   HCT 37.3 08/13/2014 08:15 AM   PLATELET 166 08/13/2014 08:15 AM   MCV 89 08/13/2014 08:15 AM       Lab Results  Component Value Date/Time   Hemoglobin A1c 6.5 01/22/2014 08:40 AM   Hemoglobin A1c 6.6 11/17/2013 09:01 AM   Hemoglobin A1c 6.5 08/28/2012 10:56 AM   Hemoglobin A1c, External 6.6 07/29/2014   Glucose 164 08/13/2014 08:15 AM   Glucose (POC) 143 01/03/2008 09:21 AM   Microalb/Creat ratio (ug/mg creat.) 8.0 08/20/2013 08:45 AM   LDL, calculated 94 08/13/2014 08:15 AM   Creatinine (POC) 0.8 04/11/2013 11:28 AM   Creatinine 1.20 08/13/2014 08:15 AM      Lab Results  Component Value Date/Time   Cholesterol, total 170 08/13/2014 08:15 AM   HDL Cholesterol 51 08/13/2014 08:15 AM   LDL, calculated 94 08/13/2014 08:15 AM   Triglyceride 124 08/13/2014 08:15 AM       Lab Results  Component Value Date/Time   GFR est AA 52 08/13/2014 08:15 AM   GFR est non-AA 45 08/13/2014 08:15 AM   Creatinine (POC) 0.8 04/11/2013 11:28 AM   Creatinine 1.20 08/13/2014 08:15 AM   BUN 36 08/13/2014 08:15 AM   Sodium 146 08/13/2014 08:15 AM   Potassium 4.5 08/13/2014 08:15 AM   Chloride 106 08/13/2014 08:15 AM   CO2 24 08/13/2014 08:15 AM  Lab Results  Component Value Date/Time   TSH 1.880 08/13/2014 08:15 AM   T4, Total 8.9 06/27/2012 09:33 AM         Review of Systems   Constitutional: Negative for chills and fever.   HENT: Negative for ear pain and nosebleeds.    Eyes: Negative for blurred vision, pain and discharge.    Respiratory: Negative for shortness of breath.    Cardiovascular: Negative for chest pain and leg swelling.   Gastrointestinal: Negative for constipation, diarrhea, nausea and vomiting.   Genitourinary: Negative for frequency.   Musculoskeletal: Negative for joint pain.   Skin: Negative for itching and rash.   Neurological: Negative for headaches.   Psychiatric/Behavioral: Negative for depression. The patient is not nervous/anxious.        Physical Exam   Constitutional: She is oriented to person, place, and time. She appears well-developed and well-nourished.   Morbid obese state with BMI of >58   HENT:   Head: Normocephalic and atraumatic.   Eyes: Conjunctivae and EOM are normal.   Neck: Normal range of motion. Neck supple. No JVD present.   Cardiovascular: Normal rate, regular rhythm and normal heart sounds.    No murmur heard.  Pulmonary/Chest: Effort normal and breath sounds normal. No stridor.   Abdominal: Soft. Bowel sounds are normal. She exhibits no distension and no mass. There is no tenderness.   Musculoskeletal: Normal range of motion. She exhibits no edema.   Nl pulses, nl visual inspection nl monoF, +dystrophy and elongated Nails   Lymphadenopathy:     She has no cervical adenopathy.   Neurological: She is alert and oriented to person, place, and time.   Skin: No erythema.   Psychiatric: Her behavior is normal.   Nursing note and vitals reviewed.      ASSESSMENT and PLAN  Allison Newman was seen today for hypertension and diabetes.    Diagnoses and all orders for this visit:    Diabetes mellitus without complication (HCC)  -     HEMOGLOBIN A1C W/O EAG  -     CBC W/O DIFF  -     TSH 3RD GENERATION  -     REFERRAL TO OPTOMETRY  -     Cancel: MICROALBUMIN, UR, RAND W/ MICROALBUMIN/CREA RATIO  -     MICROALBUMIN, UR, RAND W/ MICROALBUMIN/CREA RATIO; Future    Hypercholesterolemia  -     HEMOGLOBIN A1C W/O EAG  -     CBC W/O DIFF  -     TSH 3RD GENERATION   -     MICROALBUMIN, UR, RAND W/ MICROALBUMIN/CREA RATIO; Future    HTN, goal below 130/80  -     HEMOGLOBIN A1C W/O EAG  -     CBC W/O DIFF  -     TSH 3RD GENERATION  -     MICROALBUMIN, UR, RAND W/ MICROALBUMIN/CREA RATIO; Future    Other orders  -     Cancel: AMB POC URINALYSIS DIP STICK AUTO W/O MICRO    Normal test results except for sugar level into the <7% the target rangeand, low level of good cholesterol, was told to increase oral hydration with 8-10 glasses of daily water, also was told to be please be compliant with the other prescribed meds, and have the lean, low fat low cholesterol,  diabetic diet repeat test in your next visit,  Pt agreed with todays recommendations.

## 2015-03-24 NOTE — Patient Instructions (Signed)
Learning About Diabetes Food Guidelines  Your Care Instructions  Meal planning is important to manage diabetes. It helps keep your blood sugar at a target level (which you set with your doctor). You don't have to eat special foods. You can eat what your family eats, including sweets once in a while. But you do have to pay attention to how often you eat and how much you eat of certain foods.  You may want to work with a dietitian or a certified diabetes educator (CDE) to help you plan meals and snacks. A dietitian or CDE can also help you lose weight if that is one of your goals.  What should you know about eating carbs?  Managing the amount of carbohydrate (carbs) you eat is an important part of healthy meals when you have diabetes. Carbohydrate is found in many foods.  ?? Learn which foods have carbs. And learn the amounts of carbs in different foods.  ?? Bread, cereal, pasta, and rice have about 15 grams of carbs in a serving. A serving is 1 slice of bread (1 ounce), ?? cup of cooked cereal, or 1/3 cup of cooked pasta or rice.  ?? Fruits have 15 grams of carbs in a serving. A serving is 1 small fresh fruit, such as an apple or orange; ?? of a banana; ?? cup of cooked or canned fruit; ?? cup of fruit juice; 1 cup of melon or raspberries; or 2 tablespoons of dried fruit.  ?? Milk and no-sugar-added yogurt have 15 grams of carbs in a serving. A serving is 1 cup of milk or 2/3 cup of no-sugar-added yogurt.  ?? Starchy vegetables have 15 grams of carbs in a serving. A serving is ?? cup of mashed potatoes or sweet potato; 1 cup winter squash; ?? of a small baked potato; ?? cup of cooked beans; or ?? cup cooked corn or green peas.  ?? Learn how much carbs to eat each day and at each meal. A dietitian or CDE can teach you how to keep track of the amount of carbs you eat. This is called carbohydrate counting.  ?? If you are not sure how to count carbohydrate grams, use the Plate  Method to plan meals. It is a good, quick way to make sure that you have a balanced meal. It also helps you spread carbs throughout the day.  ?? Divide your plate by types of foods. Put non-starchy vegetables on half the plate, meat or other protein food on one-quarter of the plate, and a grain or starchy vegetable in the final quarter of the plate. To this you can add a small piece of fruit and 1 cup of milk or yogurt, depending on how many carbs you are supposed to eat at a meal.  ?? Try to eat about the same amount of carbs at each meal. Do not "save up" your daily allowance of carbs to eat at one meal.  ?? Proteins have very little or no carbs per serving. Examples of proteins are beef, chicken, turkey, fish, eggs, tofu, cheese, cottage cheese, and peanut butter. A serving size of meat is 3 ounces, which is about the size of a deck of cards. Examples of meat substitute serving sizes (equal to 1 ounce of meat) are 1/4 cup of cottage cheese, 1 egg, 1 tablespoon of peanut butter, and ?? cup of tofu.  How can you eat out and still eat healthy?  ?? Learn to estimate the serving sizes of foods that have   carbohydrate. If you measure food at home, it will be easier to estimate the amount in a serving of restaurant food.  ?? If the meal you order has too much carbohydrate (such as potatoes, corn, or baked beans), ask to have a low-carbohydrate food instead. Ask for a salad or green vegetables.  ?? If you use insulin, check your blood sugar before and after eating out to help you plan how much to eat in the future.  ?? If you eat more carbohydrate at a meal than you had planned, take a walk or do other exercise. This will help lower your blood sugar.  What else should you know?  ?? Limit saturated fat, such as the fat from meat and dairy products. This is a healthy choice because people who have diabetes are at higher risk of heart disease. So choose lean cuts of meat and nonfat or low-fat dairy  products. Use olive or canola oil instead of butter or shortening when cooking.  ?? Don't skip meals. Your blood sugar may drop too low if you skip meals and take insulin or certain medicines for diabetes.  ?? Check with your doctor before you drink alcohol. Alcohol can cause your blood sugar to drop too low. Alcohol can also cause a bad reaction if you take certain diabetes medicines.  Follow-up care is a key part of your treatment and safety. Be sure to make and go to all appointments, and call your doctor if you are having problems. It's also a good idea to know your test results and keep a list of the medicines you take.  Where can you learn more?  Go to InsuranceStats.cahttp://www.healthwise.net/GoodHelpConnections.  Enter [id number] in the search box to learn more about "Learning About Diabetes Food Guidelines."  Current as of: Jul 27, 2014  Content Version: 11.1  ?? 2006-2016 [employer]. Care instructions adapted under license by [employer] (which disclaims liability or warranty for this information). If you have questions about a medical condition or this instruction, always ask your healthcare professional. [employer] disclaims any warranty or liability for your use of this information.       Learning About ACE Inhibitors and ARBs for Diabetes  Introduction  ACE inhibitors and ARBs are medicines used to control blood pressure. They allow blood vessels to relax and open up. This lowers your blood pressure.  When you have diabetes, taking an ACE inhibitor or ARB can help to:  ?? Treat high blood pressure. Your risk of problems from diabetes goes up when you have high blood pressure.  ?? Prevent or slow kidney damage. Diabetes can damage the blood vessels in the kidneys. High blood pressure can damage the kidneys, too.  ?? Lower the risks of stroke and heart attack. Your risks go up when you have high blood pressure, heart disease, or both.   An ACE inhibitor or ARB is a good choice for people with diabetes. Unlike some medicines, these don't affect blood sugar levels.  Examples  ACE inhibitors include:  ?? Benazepril.  ?? Lisinopril.  ?? Ramipril.  ARBs include:  ?? Irbesartan.  ?? Losartan.  ?? Telmisartan.  Possible side effects  All medicines can cause side effects.  Some side effects of ACE inhibitors include:  ?? Low blood pressure. You may feel dizzy and weak.  ?? A cough.  ?? High potassium levels.  ?? An allergic reaction of the skin. Symptoms may range from mild swelling to painful welts.  Some side effects of ARBs  include:  ?? Diarrhea.  ?? High potassium levels.  ?? Sinus problems.  ?? Stomach problems.  You may have other side effects or reactions not listed here. Check the information that comes with your medicine.  What to know about taking this medicine  ?? Be safe with medicines. Take your medicines exactly as prescribed. Call your doctor if you think you are having a problem with your medicine.  ?? Before starting an ACE inhibitor or ARB, tell your doctor if you:  ?? Use a salt substitute.  ?? Take diuretics or potassium tablets.  ?? These medicines are not safe for pregnancy. If you are pregnant or planning to be, talk to your doctor about a safe blood pressure medicine.  ?? ACE inhibitors can cause a dry cough. If the cough is bad, talk to your doctor. Switching to an ARB is likely to help.  ?? Taking some medicines together can cause problems. Tell your doctor or pharmacist all the medicines you take. This includes over-the-counter medicines, vitamins, herbal products, and supplements.  ?? You may need regular blood and urine tests.  Where can you learn more?  Go to InsuranceStats.ca.  Enter M316 in the search box to learn more about "Learning About ACE Inhibitors and ARBs for Diabetes."  Current as of: August 17, 2014  Content Version: 11.1  ?? 2006-2016 [employer]. Care instructions adapted under  license by [employer] (which disclaims liability or warranty for this information). If you have questions about a medical condition or this instruction, always ask your healthcare professional. [employer] disclaims any warranty or liability for your use of this information.       Diabetes Foot Health: Care Instructions  Your Care Instructions    When you have diabetes, your feet need extra care and attention. Diabetes can damage the nerve endings and blood vessels in your feet, making you less likely to notice when your feet are injured. Diabetes also limits your body's ability to fight infection and get blood to areas that need it. If you get a minor foot injury, it could become an ulcer or a serious infection. With good foot care, you can prevent most of these problems.  Caring for your feet can be quick and easy. Most of the care can be done when you are bathing or getting ready for bed.  Follow-up care is a key part of your treatment and safety. Be sure to make and go to all appointments, and call your doctor if you are having problems. It???s also a good idea to know your test results and keep a list of the medicines you take.  How can you care for yourself at home?  ?? Keep your blood sugar close to normal by watching what and how much you eat, monitoring blood sugar, taking medicines if prescribed, and getting regular exercise.  ?? Do not smoke. Smoking affects blood flow and can make foot problems worse. If you need help quitting, talk to your doctor about stop-smoking programs and medicines. These can increase your chances of quitting for good.  ?? Eat a diet that is low in fats. High fat intake can cause fat to build up in your blood vessels and decrease blood flow.  ?? Inspect your feet daily for blisters, cuts, cracks, or sores. If you cannot see well, use a mirror or have someone help you.  ?? Take care of your feet:   ?? Wash your feet every day. Use warm (not hot) water. Check  the water temperature with your wrists or other part of your body, not your feet.  ?? Dry your feet well. Pat them dry. Do not rub the skin on your feet too hard. Dry well between your toes. If the skin on your feet stays moist, bacteria or a fungus can grow, which can lead to infection.  ?? Keep your skin soft. Use moisturizing skin cream to keep the skin on your feet soft and prevent calluses and cracks. But do not put the cream between your toes, and stop using any cream that causes a rash.  ?? Clean underneath your toenails carefully. Do not use a sharp object to clean underneath your toenails. Use the blunt end of a nail file or other rounded tool.  ?? Trim and file your toenails straight across to prevent ingrown toenails. Use a nail clipper, not scissors. Use an emery board to smooth the edges.  ?? Change socks daily. Socks without seams are best, because seams often rub the feet. You can find socks for people with diabetes from specialty catalogs.  ?? Look inside your shoes every day for things like gravel or torn linings, which could cause blisters or sores.  ?? Buy shoes that fit well:  ?? Look for shoes that have plenty of space around the toes. This helps prevent bunions and blisters.  ?? Try on shoes while wearing the kind of socks you will usually wear with the shoes.  ?? Avoid plastic shoes. They may rub your feet and cause blisters. Good shoes should be made of materials that are flexible and breathable, such as leather or cloth.  ?? Break in new shoes slowly by wearing them for no more than an hour a day for several days. Take extra time to check your feet for red areas, blisters, or other problems after you wear new shoes.  ?? Do not go barefoot. Do not wear sandals, and do not wear shoes with very thin soles. Thin soles are easy to puncture. They also do not protect your feet from hot pavement or cold weather.   ?? Have your doctor check your feet during each visit. If you have a foot problem, see your doctor. Do not try to treat an early foot problem at home. Home remedies or treatments that you can buy without a prescription (such as corn removers) can be harmful.  ?? Always get early treatment for foot problems. A minor irritation can lead to a major problem if not properly cared for early.  When should you call for help?  Call your doctor now or seek immediate medical care if:  ?? You have a foot sore, an ulcer or break in the skin that is not healing after 4 days, bleeding corns or calluses, or an ingrown toenail.  ?? You have blue or black areas, which can mean bruising or blood flow problems.  ?? You have peeling skin or tiny blisters between your toes or cracking or oozing of the skin.  ?? You have a fever for more than 24 hours and a foot sore.  ?? You have new numbness or tingling in your feet that does not go away after you move your feet or change positions.  ?? You have unexplained or unusual swelling of the foot or ankle.  Watch closely for changes in your health, and be sure to contact your doctor if:  ?? You cannot do proper foot care.  Where can you learn more?  Go to StreetWrestling.at.  Enter  A739 in the search box to learn more about "Diabetes Foot Health: Care Instructions."  Current as of: Jul 27, 2014  Content Version: 11.1  ?? 2006-2016 [employer]. Care instructions adapted under license by [employer] (which disclaims liability or warranty for this information). If you have questions about a medical condition or this instruction, always ask your healthcare professional. [place] any warranty or liability for your use of this information.

## 2015-03-25 ENCOUNTER — Other Ambulatory Visit: Payer: Self-pay | Admitting: Internal Medicine

## 2015-03-25 DIAGNOSIS — E041 Nontoxic single thyroid nodule: Secondary | ICD-10-CM

## 2015-03-25 LAB — MICROALBUMIN, UR, RAND W/ MICROALB/CREAT RATIO
Creatinine, urine random: 148.4 mg/dL
Microalb/Creat ratio (ug/mg creat.): 6.5 mg/g creat (ref 0.0–30.0)
Microalbumin, urine: 9.6 ug/mL

## 2015-03-25 LAB — TSH 3RD GENERATION: TSH: 1.39 u[IU]/mL (ref 0.450–4.500)

## 2015-03-25 LAB — CBC W/O DIFF
HCT: 36.6 % (ref 34.0–46.6)
HGB: 11.6 g/dL (ref 11.1–15.9)
MCH: 28.4 pg (ref 26.6–33.0)
MCHC: 31.7 g/dL (ref 31.5–35.7)
MCV: 90 fL (ref 79–97)
PLATELET: 172 10*3/uL (ref 150–379)
RBC: 4.08 x10E6/uL (ref 3.77–5.28)
RDW: 14.8 % (ref 12.3–15.4)
WBC: 5.2 10*3/uL (ref 3.4–10.8)

## 2015-03-25 LAB — HEMOGLOBIN A1C W/O EAG: Hemoglobin A1c: 6.8 % — ABNORMAL HIGH (ref 4.8–5.6)

## 2015-03-26 ENCOUNTER — Ambulatory Visit
Admission: RE | Admit: 2015-03-26 | Discharge: 2015-03-26 | Disposition: A | Discharge status: Home / Self Care | Payer: Medicare PPO | Source: Ambulatory Visit | Attending: Internal Medicine | Admitting: Internal Medicine

## 2015-03-26 DIAGNOSIS — E041 Nontoxic single thyroid nodule: Secondary | ICD-10-CM

## 2015-03-31 MED ORDER — JANUMET 50 MG-500 MG TABLET
50-500 mg | ORAL_TABLET | ORAL | 6 refills | Status: DC
Start: 2015-03-31 — End: 2015-11-02

## 2015-04-02 NOTE — Telephone Encounter (Signed)
Returned call to pt.  Requesting to reschedule appt to earlier time.  appt rescheduled for 05/03/15@730 

## 2015-04-02 NOTE — Telephone Encounter (Signed)
-----   Message from Diona Browner sent at 04/02/2015 10:48 AM EST -----  Regarding: Dr. Michelene Heady   Pt would like am earlier time for his appt. She would like to come in at 8:30 a. The best number for the pt is [phone] or [phone].

## 2015-04-05 MED ORDER — TRIAMTERENE-HYDROCHLOROTHIAZIDE 37.5 MG-25 MG CAP
ORAL_CAPSULE | ORAL | 3 refills | Status: DC
Start: 2015-04-05 — End: 2015-08-01

## 2015-04-26 MED ORDER — DICLOFENAC 75 MG TAB, DELAYED RELEASE
75 mg | ORAL_TABLET | ORAL | 3 refills | Status: DC
Start: 2015-04-26 — End: 2015-06-03

## 2015-04-29 ENCOUNTER — Encounter: Attending: Family Medicine | Primary: Geriatric Medicine

## 2015-05-03 ENCOUNTER — Ambulatory Visit: Admit: 2015-05-03 | Discharge: 2015-05-03 | Payer: MEDICARE | Attending: Family Medicine | Primary: Geriatric Medicine

## 2015-05-03 ENCOUNTER — Inpatient Hospital Stay: Admit: 2015-06-16 | Payer: MEDICARE | Primary: Geriatric Medicine

## 2015-05-03 DIAGNOSIS — Z0000 Encounter for general adult medical examination without abnormal findings: Secondary | ICD-10-CM

## 2015-05-03 DIAGNOSIS — Z1211 Encounter for screening for malignant neoplasm of colon: Secondary | ICD-10-CM

## 2015-05-03 MED ORDER — ASPIRIN 81 MG TAB, DELAYED RELEASE
81 mg | ORAL_TABLET | Freq: Every day | ORAL | 11 refills | Status: AC
Start: 2015-05-03 — End: ?

## 2015-05-03 NOTE — Progress Notes (Signed)
Order placed for bevespi 2 puffs twice daily  per Verbal Order from Dr. Demetrios Isaacs on 05/05/2015 due to insurance will not cover combivent.  Pt notified

## 2015-05-03 NOTE — Patient Instructions (Addendum)
Learning About Diabetes Food Guidelines  Your Care Instructions  Meal planning is important to manage diabetes. It helps keep your blood sugar at a target level (which you set with your doctor). You don't have to eat special foods. You can eat what your family eats, including sweets once in a while. But you do have to pay attention to how often you eat and how much you eat of certain foods.  You may want to work with a dietitian or a certified diabetes educator (CDE) to help you plan meals and snacks. A dietitian or CDE can also help you lose weight if that is one of your goals.  What should you know about eating carbs?  Managing the amount of carbohydrate (carbs) you eat is an important part of healthy meals when you have diabetes. Carbohydrate is found in many foods.  ?? Learn which foods have carbs. And learn the amounts of carbs in different foods.  ?? Bread, cereal, pasta, and rice have about 15 grams of carbs in a serving. A serving is 1 slice of bread (1 ounce), ?? cup of cooked cereal, or 1/3 cup of cooked pasta or rice.  ?? Fruits have 15 grams of carbs in a serving. A serving is 1 small fresh fruit, such as an apple or orange; ?? of a banana; ?? cup of cooked or canned fruit; ?? cup of fruit juice; 1 cup of melon or raspberries; or 2 tablespoons of dried fruit.  ?? Milk and no-sugar-added yogurt have 15 grams of carbs in a serving. A serving is 1 cup of milk or 2/3 cup of no-sugar-added yogurt.  ?? Starchy vegetables have 15 grams of carbs in a serving. A serving is ?? cup of mashed potatoes or sweet potato; 1 cup winter squash; ?? of a small baked potato; ?? cup of cooked beans; or ?? cup cooked corn or green peas.  ?? Learn how much carbs to eat each day and at each meal. A dietitian or CDE can teach you how to keep track of the amount of carbs you eat. This is called carbohydrate counting.  ?? If you are not sure how to count carbohydrate grams, use the Plate  Method to plan meals. It is a good, quick way to make sure that you have a balanced meal. It also helps you spread carbs throughout the day.  ?? Divide your plate by types of foods. Put non-starchy vegetables on half the plate, meat or other protein food on one-quarter of the plate, and a grain or starchy vegetable in the final quarter of the plate. To this you can add a small piece of fruit and 1 cup of milk or yogurt, depending on how many carbs you are supposed to eat at a meal.  ?? Try to eat about the same amount of carbs at each meal. Do not "save up" your daily allowance of carbs to eat at one meal.  ?? Proteins have very little or no carbs per serving. Examples of proteins are beef, chicken, turkey, fish, eggs, tofu, cheese, cottage cheese, and peanut butter. A serving size of meat is 3 ounces, which is about the size of a deck of cards. Examples of meat substitute serving sizes (equal to 1 ounce of meat) are 1/4 cup of cottage cheese, 1 egg, 1 tablespoon of peanut butter, and ?? cup of tofu.  How can you eat out and still eat healthy?  ?? Learn to estimate the serving sizes of foods that have   carbohydrate. If you measure food at home, it will be easier to estimate the amount in a serving of restaurant food.  ?? If the meal you order has too much carbohydrate (such as potatoes, corn, or baked beans), ask to have a low-carbohydrate food instead. Ask for a salad or green vegetables.  ?? If you use insulin, check your blood sugar before and after eating out to help you plan how much to eat in the future.  ?? If you eat more carbohydrate at a meal than you had planned, take a walk or do other exercise. This will help lower your blood sugar.  What else should you know?  ?? Limit saturated fat, such as the fat from meat and dairy products. This is a healthy choice because people who have diabetes are at higher risk of heart disease. So choose lean cuts of meat and nonfat or low-fat dairy  products. Use olive or canola oil instead of butter or shortening when cooking.  ?? Don't skip meals. Your blood sugar may drop too low if you skip meals and take insulin or certain medicines for diabetes.  ?? Check with your doctor before you drink alcohol. Alcohol can cause your blood sugar to drop too low. Alcohol can also cause a bad reaction if you take certain diabetes medicines.  Follow-up care is a key part of your treatment and safety. Be sure to make and go to all appointments, and call your doctor if you are having problems. It's also a good idea to know your test results and keep a list of the medicines you take.  Where can you learn more?  Go to [email or web address]://[email or web address].  Enter [phone] in the search box to learn more about "Learning About Diabetes Food Guidelines."  Current as of: Jul 27, 2014  Content Version: 11.1  ?? 2006-2016 [employer]. Care instructions adapted under license by [employer] (which disclaims liability or warranty for this information). If you have questions about a medical condition or this instruction, always ask your healthcare professional. [employer] disclaims any warranty or liability for your use of this information.         High Cholesterol: Care Instructions  Your Care Instructions  Cholesterol is a type of fat in your blood. It is needed for many body functions, such as making new cells. Cholesterol is made by your body. It also comes from food you eat. High cholesterol means that you have too much of the fat in your blood. This raises your risk of a heart attack and stroke.  LDL and HDL are part of your total cholesterol. LDL is the "bad" cholesterol. High LDL can raise your risk for heart disease, heart attack, and stroke. HDL is the "good" cholesterol. It helps clear bad cholesterol from the body. High HDL is linked with a lower risk of heart disease, heart attack, and stroke.   Your cholesterol levels help your doctor find out your risk for having a heart attack or stroke. You and your doctor can talk about whether you need to lower your risk and what treatment is best for you.  A heart-healthy lifestyle along with medicines can help lower your cholesterol and your risk. The way you choose to lower your risk will depend on how high your risk is for heart attack and stroke. It will also depend on how you feel about taking medicines.  Follow-up care is a key part of your treatment and safety. Be sure to make and go to  all appointments, and call your doctor if you are having problems. It's also a good idea to know your test results and keep a list of the medicines you take.  How can you care for yourself at home?  ?? Eat a variety of foods every day. Good choices include fruits, vegetables, whole grains (like oatmeal), dried beans and peas, nuts and seeds, soy products (like tofu), and fat-free or low-fat dairy products.  ?? Replace butter, margarine, and hydrogenated or partially hydrogenated oils with olive and canola oils. (Canola oil margarine without trans fat is fine.)  ?? Replace red meat with fish, poultry, and soy protein (like tofu).  ?? Limit processed and packaged foods like chips, crackers, and cookies.  ?? Bake, broil, or steam foods. Don't fry them.  ?? Be physically active. Get at least 30 minutes of exercise on most days of the week. Walking is a good choice. You also may want to do other activities, such as running, swimming, cycling, or playing tennis or team sports.  ?? Stay at a healthy weight or lose weight by making the changes in eating and physical activity listed above. Losing just a small amount of weight, even 5 to 10 pounds, can reduce your risk for having a heart attack or stroke.  ?? Do not smoke.  When should you call for help?  Watch closely for changes in your health, and be sure to contact your doctor if:  ?? You need help making lifestyle changes.   ?? You have questions about your medicine.  Where can you learn more?  Go to [employer].  Enter [phone] in the search box to learn more about "High Cholesterol: Care Instructions."  Current as of: April 01, 2014  Content Version: 11.1  ?? 2006-2016 [employer]. Care instructions adapted under license by [employer] (which disclaims liability or warranty for this information). If you have questions about a medical condition or this instruction, always ask your healthcare professional. [employer] disclaims any warranty or liability for your use of this information.      Hyperlipidemia: After Your Visit  Your Care Instructions  Hyperlipidemia is too much fat in your blood. The body has several kinds of fat, including cholesterol and triglycerides. Your body needs fat for many things, such as making new cells. But too much fat in your blood increases your chances of having a heart attack or stroke.  You may be able to lower your cholesterol and triglycerides with a heart-healthy diet, exercise, and if needed, medicine. Your doctor may want you to try lifestyle changes first to see whether they lower the fat in your blood. You may need to take medicine if lifestyle changes do not lower the fat in your blood enough.  Follow-up care is a key part of your treatment and safety. Be sure to make and go to all appointments, and call your doctor if you are having problems. It???s also a good idea to know your test results and keep a list of the medicines you take.  How can you care for yourself at home?  Take your medicines  ?? Take your medicines exactly as prescribed. Call your doctor if you think you are having a problem with your medicine.  ?? If you take medicine to lower your cholesterol, go to follow-up visits. You will need to have blood tests.  ?? Do not take large doses of niacin, which is a B vitamin, while taking  medicine called statins. It may increase the  chance of muscle pain and liver problems.  ?? Talk to your doctor about avoiding grapefruit juice if you are taking statins. Grapefruit juice can raise the level of this medicine in your blood. This could increase side effects.  Eat more fruits, vegetables, and fiber  ?? Fruits and vegetables have lots of nutrients that help protect against heart disease, and they have little???if any???fat. Try to eat at least five servings a day. Dark green, deep orange, or yellow fruits and vegetables are healthy choices.  ?? Keep carrots, celery, and other veggies handy for snacks. Buy fruit that is in season and store it where you can see it so that you will be tempted to eat it. Cook dishes that have a lot of veggies in them, such as stir-fries and soups.  ?? Foods high in fiber may reduce your cholesterol and provide important vitamins and minerals. High-fiber foods include whole-grain cereals and breads, oatmeal, beans, brown rice, citrus fruits, and apples.  ?? Buy whole-grain breads and cereals instead of white bread and pastries.  Limit saturated fat  ?? Read food labels and try to avoid saturated fat and trans fat. They increase your risk of heart disease.  ?? Use olive or canola oil when you cook. Try cholesterol-lowering spreads, such as Benecol or Take Control.  ?? Bake, broil, grill, or steam foods instead of frying them.  ?? Limit the amount of high-fat meats you eat, including hot dogs and sausages. Cut out all visible fat when you prepare meat.  ?? Eat fish, skinless poultry, and soy products such as tofu instead of high-fat meats. Soybeans may be especially good for your heart. Eat at least two servings of fish a week. Certain fish, such as salmon, contain omega-3 fatty acids, which may help reduce your risk of heart attack.  ?? Choose low-fat or fat-free milk and dairy products.  Get exercise, limit alcohol, and quit smoking   ?? Get more exercise. Work with your doctor to set up an exercise program. Even if you can do only a small amount, exercise will help you get stronger, have more energy, and manage your weight and your stress. Walking is an easy way to get exercise. Gradually increase the amount you walk every day. Aim for at least 30 minutes on most days of the week. You also may want to swim, bike, or do other activities.  ?? Limit alcohol to no more than 2 drinks a day for men and 1 drink a day for women.  ?? Do not smoke. If you need help quitting, talk to your doctor about stop-smoking programs and medicines. These can increase your chances of quitting for good.  When should you call for help?  Call 911 anytime you think you may need emergency care. For example, call if:  ?? You have symptoms of a heart attack. These may include:  ?? Chest pain or pressure, or a strange feeling in the chest.  ?? Sweating.  ?? Shortness of breath.  ?? Nausea or vomiting.  ?? Pain, pressure, or a strange feeling in the back, neck, jaw, or upper belly or in one or both shoulders or arms.  ?? Lightheadedness or sudden weakness.  ?? A fast or irregular heartbeat.  After you call 911, the operator may tell you to chew 1 adult-strength or 2 to 4 low-dose aspirin. Wait for an ambulance. Do not try to drive yourself.  ?? You have signs of a stroke. These may include:  ?? Sudden numbness,  paralysis, or weakness in your face, arm, or leg, especially on only one side of your body.  ?? New problems with walking or balance.  ?? Sudden vision changes.  ?? Drooling or slurred speech.  ?? New problems speaking or understanding simple statements, or feeling confused.  ?? A sudden, severe headache that is different from past headaches.  ?? You passed out (lost consciousness).  Call your doctor now or seek immediate medical care if:  ?? You have muscle pain or weakness.  Watch closely for changes in your health, and be sure to contact your doctor if:  ?? You are very tired.   ?? You have an upset stomach, gas, constipation, or belly pain or cramps.   Where can you learn more?   Go to [email or web address]  Enter C406 in the search box to learn more about "Hyperlipidemia: After Your Visit."   ?? 2006-2013 [employer], [employer]. Care instructions adapted under license by [employer] (which disclaims liability or warranty for this information). This care instruction is for use with your licensed healthcare professional. If you have questions about a medical condition or this instruction, always ask your healthcare professional. [employer], [employer] disclaims any warranty or liability for your use of this information.  Content Version: 9.9.209917; Last Revised: December 16, 2009                   Statins: Care Instructions  Your Care Instructions  Statins are medicines that lower your cholesterol and your risk for a heart attack and stroke.  Cholesterol is a type of fat in your blood. If you have too much cholesterol, it can build up in blood vessels. This raises your risk of heart disease, heart attack, and stroke.  Statins lower cholesterol by blocking how much cholesterol your body makes. This prevents cholesterol from building up in your blood vessels. This is called hardening of the arteries. It is the starting point for some heart and blood flow problems, such as heart disease. Statins may also reduce inflammation around the buildup (called plaque). This can lower the risk that the plaque will break apart and lead to a heart attack or stroke.  A heart-healthy lifestyle is important for lowering your risk whether you take statins or not. This includes eating healthy foods, being active, staying at a healthy weight, and not smoking.  You must take statins regularly for them to work well. If you stop, your cholesterol and your risk will go back up.  Examples of statins include:  ?? Atorvastatin (Lipitor).  ?? Lovastatin (Mevacor).  ?? Pravastatin (Pravachol).   ?? Simvastatin (Zocor).  Statins interact with many medicines. So tell your doctor all of the other medicines that you take. These include prescription medicines, over-the-counter medicines, dietary supplements, and herbal products.  Follow-up care is a key part of your treatment and safety. Be sure to make and go to all appointments, and call your doctor if you are having problems. It's also a good idea to know your test results and keep a list of the medicines you take.  How can you care for yourself at home?  ?? Take statins exactly as your doctor tells you. High cholesterol has no symptoms. So it is easy to forget to take the pills. Try to make a system that reminds you to take them.  ?? Do not take two or more medicines at the same time unless the doctor told you to. Statins can interact with other medicines.  ?? Always tell your  doctor if you think you are having a side effect. If side effects are a problem with one medicine, a different one may be used.  ?? Keep making the lifestyle changes your doctor suggests. Eat heart-healthy foods, be active, don't smoke, and stay at a healthy weight.  ?? Talk to your doctor about avoiding grapefruit juice if you take statins. Grapefruit juice can raise the level of this medicine in your blood. This could increase side effects.  When should you call for help?  Watch closely for changes in your health, and be sure to contact your doctor if:  ?? You have side effects of statins. These include:  ?? Fatigue.  ?? Upset stomach.  ?? Gas.  ?? Constipation.  ?? Pain or cramps in the belly.  ?? Muscle aches.  ?? You have any new symptoms or side effects.  Where can you learn more?  Go to [employer].  Enter R358 in the search box to learn more about "Statins: Care Instructions."  Current as of: September 03, 2014  Content Version: 11.1  ?? 2006-2016 [employer]. Care instructions adapted under  license by [employer] (which disclaims liability or warranty for this information). If you have questions about a medical condition or this instruction, always ask your healthcare professional. [employer] disclaims any warranty or liability for your use of this information.         Heart-Healthy Diet: Care Instructions  Your Care Instructions    A heart-healthy diet has lots of vegetables, fruits, nuts, beans, and whole grains, and is low in salt. It limits foods that are high in saturated fat, such as meats, cheeses, and fried foods. It may be hard to change your diet, but even small changes can lower your risk of heart attack and heart disease.  Follow-up care is a key part of your treatment and safety. Be sure to make and go to all appointments, and call your doctor if you are having problems. It's also a good idea to know your test results and keep a list of the medicines you take.  How can you care for yourself at home?  Watch your portions  ?? Learn what a serving is. A "serving" and a "portion" are not always the same thing. Make sure that you are not eating larger portions than are recommended. For example, a serving of pasta is ?? cup. A serving size of meat is 2 to 3 ounces. A 3-ounce serving is about the size of a deck of cards. Measure serving sizes until you are good at "eyeballing" them. Keep in mind that restaurants often serve portions that are 2 or 3 times the size of one serving.  ?? To keep your energy level up and keep you from feeling hungry, eat often but in smaller portions.  ?? Eat only the number of calories you need to stay at a healthy weight. If you need to lose weight, eat fewer calories than your body burns (through exercise and other physical activity).  Eat more fruits and vegetables  ?? Eat a variety of fruit and vegetables every day. Dark green, deep orange, red, or yellow fruits and vegetables are especially good for you.  Examples include spinach, carrots, peaches, and berries.  ?? Keep carrots, celery, and other veggies handy for snacks. Buy fruit that is in season and store it where you can see it so that you will be tempted to eat it.  ?? Cook dishes that have a lot of veggies  in them, such as stir-fries and soups.  Limit saturated and trans fat  ?? Read food labels, and try to avoid saturated and trans fats. They increase your risk of heart disease. Trans fat is found in many processed foods such as cookies and crackers.  ?? Use olive or canola oil when you cook. Try cholesterol-lowering spreads, such as Benecol or Take Control.  ?? Bake, broil, grill, or steam foods instead of frying them.  ?? Choose lean meats instead of high-fat meats such as hot dogs and sausages. Cut off all visible fat when you prepare meat.  ?? Eat fish, skinless poultry, and meat alternatives such as soy products instead of high-fat meats. Soy products, such as tofu, may be especially good for your heart.  ?? Choose low-fat or fat-free milk and dairy products.  Eat fish  ?? Eat at least two servings of fish a week. Certain fish, such as salmon and tuna, contain omega-3 fatty acids, which may help reduce your risk of heart attack.  Eat foods high in fiber  ?? Eat a variety of grain products every day. Include whole-grain foods that have lots of fiber and nutrients. Examples of whole-grain foods include oats, whole wheat bread, and brown rice.  ?? Buy whole-grain breads and cereals, instead of white bread or pastries.  Limit salt and sodium  ?? Limit how much salt and sodium you eat to help lower your blood pressure.  ?? Taste food before you salt it. Add only a little salt when you think you need it. With time, your taste buds will adjust to less salt.  ?? Eat fewer snack items, fast foods, and other high-salt, processed foods. Check food labels for the amount of sodium in packaged foods.  ?? Choose low-sodium versions of canned goods (such as soups, vegetables,  and beans).  Limit sugar  ?? Limit drinks and foods with added sugar. These include candy, desserts, and soda pop.  Limit alcohol  ?? Limit alcohol to no more than 2 drinks a day for men and 1 drink a day for women. Too much alcohol can cause health problems.  When should you call for help?  Watch closely for changes in your health, and be sure to contact your doctor if:  ?? You would like help planning heart-healthy meals.  Where can you learn more?  Go to [employer].  Enter V137 in the search box to learn more about "Heart-Healthy Diet: Care Instructions."  Current as of: April 01, 2014  Content Version: 11.1  ?? 2006-2016 [employer]. Care instructions adapted under license by [employer] (which disclaims liability or warranty for this information). If you have questions about a medical condition or this instruction, always ask your healthcare professional. [employer], [employer] disclaims any warranty or liability for your use of this information.         Advance Directives: Care Instructions  Your Care Instructions  An advance directive is a legal way to state your wishes at the end of your life. It tells your family and your doctor what to do if you can no longer say what you want.  There are two main types of advance directives. You can change them any time that your wishes change.  ?? A living will tells your family and your doctor your wishes about life support and other treatment.  ?? A medical power of attorney lets you name a person to make treatment decisions for you when you can't speak for yourself. This person  is called a health care agent.  If you do not have an advance directive, decisions about your medical care may be made by a doctor or a judge who doesn't know you.  It may help to think of an advance directive as a gift to the people who care for you. If you have one, they won't have to make tough decisions by themselves.   Follow-up care is a key part of your treatment and safety. Be sure to make and go to all appointments, and call your doctor if you are having problems. It's also a good idea to know your test results and keep a list of the medicines you take.  How can you care for yourself at home?  ?? Discuss your wishes with your loved ones and your doctor. This way, there are no surprises.  ?? Many states have a unique form. Or you might use a universal form that has been approved by many states. This kind of form can sometimes be completed and stored online. Your electronic copy will then be available wherever you have a connection to the Internet. In most cases, doctors will respect your wishes even if you have a form from a different state.  ?? You don't need a lawyer to do an advance directive. But you may want to get legal advice.  ?? Think about these questions when you prepare an advance directive:  ?? Who do you want to make decisions about your medical care if you are not able to? Many people choose a family member, close friend, or doctor.  ?? Do you know enough about life support methods that might be used? If not, talk to your doctor so you understand.  ?? What are you most afraid of that might happen? You might be afraid of having pain, losing your independence, or being kept alive by machines.  ?? Where would you prefer to die? Choices include your home, a hospital, or a nursing home.  ?? Would you like to have information about hospice care to support you and your family?  ?? Do you want to donate organs when you die?  ?? Do you want certain religious practices performed before you die? If so, put your wishes in the advance directive.  ?? Read your advance directive every year, and make changes as needed.  When should you call for help?  Be sure to contact your doctor if you have any questions.  Where can you learn more?  Go to [employer].   Enter R264 in the search box to learn more about "Advance Directives: Care Instructions."  Current as of: April 29, 2014  Content Version: 11.1  ?? 2006-2016 [employer]. Care instructions adapted under license by [employer] (which disclaims liability or warranty for this information). If you have questions about a medical condition or this instruction, always ask your healthcare professional. [employer] disclaims any warranty or liability for your use of this information.       Learning About Diabetes Food Guidelines  Your Care Instructions  Meal planning is important to manage diabetes. It helps keep your blood sugar at a target level (which you set with your doctor). You don't have to eat special foods. You can eat what your family eats, including sweets once in a while. But you do have to pay attention to how often you eat and how much you eat of certain foods.  You may want to work with a dietitian or a certified diabetes educator (  CDE) to help you plan meals and snacks. A dietitian or CDE can also help you lose weight if that is one of your goals.  What should you know about eating carbs?  Managing the amount of carbohydrate (carbs) you eat is an important part of healthy meals when you have diabetes. Carbohydrate is found in many foods.  ?? Learn which foods have carbs. And learn the amounts of carbs in different foods.  ?? Bread, cereal, pasta, and rice have about 15 grams of carbs in a serving. A serving is 1 slice of bread (1 ounce), ?? cup of cooked cereal, or 1/3 cup of cooked pasta or rice.  ?? Fruits have 15 grams of carbs in a serving. A serving is 1 small fresh fruit, such as an apple or orange; ?? of a banana; ?? cup of cooked or canned fruit; ?? cup of fruit juice; 1 cup of melon or raspberries; or 2 tablespoons of dried fruit.  ?? Milk and no-sugar-added yogurt have 15 grams of carbs in a serving. A serving is 1 cup of milk or 2/3 cup of no-sugar-added yogurt.   ?? Starchy vegetables have 15 grams of carbs in a serving. A serving is ?? cup of mashed potatoes or sweet potato; 1 cup winter squash; ?? of a small baked potato; ?? cup of cooked beans; or ?? cup cooked corn or green peas.  ?? Learn how much carbs to eat each day and at each meal. A dietitian or CDE can teach you how to keep track of the amount of carbs you eat. This is called carbohydrate counting.  ?? If you are not sure how to count carbohydrate grams, use the Plate Method to plan meals. It is a good, quick way to make sure that you have a balanced meal. It also helps you spread carbs throughout the day.  ?? Divide your plate by types of foods. Put non-starchy vegetables on half the plate, meat or other protein food on one-quarter of the plate, and a grain or starchy vegetable in the final quarter of the plate. To this you can add a small piece of fruit and 1 cup of milk or yogurt, depending on how many carbs you are supposed to eat at a meal.  ?? Try to eat about the same amount of carbs at each meal. Do not "save up" your daily allowance of carbs to eat at one meal.  ?? Proteins have very little or no carbs per serving. Examples of proteins are beef, chicken, [place], fish, eggs, tofu, cheese, cottage cheese, and peanut butter. A serving size of meat is 3 ounces, which is about the size of a deck of cards. Examples of meat substitute serving sizes (equal to 1 ounce of meat) are 1/4 cup of cottage cheese, 1 egg, 1 tablespoon of peanut butter, and ?? cup of tofu.  How can you eat out and still eat healthy?  ?? Learn to estimate the serving sizes of foods that have carbohydrate. If you measure food at home, it will be easier to estimate the amount in a serving of restaurant food.  ?? If the meal you order has too much carbohydrate (such as potatoes, corn, or baked beans), ask to have a low-carbohydrate food instead. Ask for a salad or green vegetables.  ?? If you use insulin, check your blood sugar before and after eating out  to help you plan how much to eat in the future.  ?? If you eat more carbohydrate  at a meal than you had planned, take a walk or do other exercise. This will help lower your blood sugar.  What else should you know?  ?? Limit saturated fat, such as the fat from meat and dairy products. This is a healthy choice because people who have diabetes are at higher risk of heart disease. So choose lean cuts of meat and nonfat or low-fat dairy products. Use olive or canola oil instead of butter or shortening when cooking.  ?? Don't skip meals. Your blood sugar may drop too low if you skip meals and take insulin or certain medicines for diabetes.  ?? Check with your doctor before you drink alcohol. Alcohol can cause your blood sugar to drop too low. Alcohol can also cause a bad reaction if you take certain diabetes medicines.  Follow-up care is a key part of your treatment and safety. Be sure to make and go to all appointments, and call your doctor if you are having problems. It's also a good idea to know your test results and keep a list of the medicines you take.  Where can you learn more?  Go to [employer].  Enter [phone] in the search box to learn more about "Learning About Diabetes Food Guidelines."  Current as of: Jul 27, 2014  Content Version: 11.1  ?? 2006-2016 [employer]. Care instructions adapted under license by [employer] (which disclaims liability or warranty for this information). If you have questions about a medical condition or this instruction, always ask your healthcare professional. [employer] disclaims any warranty or liability for your use of this information.

## 2015-05-03 NOTE — Progress Notes (Signed)
This is an Initial Medicare Annual Wellness Exam (AWV) (Performed 12 months after IPPE or effective date of Medicare Part B enrollment, Once in a lifetime)    I have reviewed the patient's medical history in detail and updated the computerized patient record.     History   Present for CPE, last Complete Physical exam was couple yrs ago,  Not Up todate w/ all vaccination such as her Tdap, pneumonia and also influenza vaccines today offered patient repeatedly refused to get them, last tetanus vaccine was unknown,last mammog was nl and In 2016,    last pap smear normal and was in few yrs ago .       last colonoscopy was normal and was >97yrs Ago,   ++ TKR on the rt side as her past surgical hx,  last bone dexa scan was abnormal and was with osteopenia 26yr Ago,   No family hx of breast cancer with sisters   ++family hx of colon cancer with sister, father deceased from lung can ++cigs, mother deceased at age of 13yo from old age, not sexaully active , not physically active,  compliant w/ meds,++ Rf needed for today for her meds.       Past Medical History:   Diagnosis Date   ??? Abdominal pain 05/08/2013   ??? Abnormal finding on EKG 11/17/2013   ??? Arthritis    ??? 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 Chronic pain of left knee 08/13/2014   ??? Chronic right hip pain 03/17/2013   ??? Diabetes (HCC)    ??? Elevated BUN 09/13/2011   ??? Gallbladder polyp 04/08/2013   ??? Gastrointestinal disorder     GERD   ??? GERD (gastroesophageal reflux disease)    ??? Hypercholesterolemia    ??? Hypertension    ??? 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 Microalbuminuria 08/29/2013   ??? 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 Other unknown and unspecified cause of morbidity or mortality     hx of bronchitis   ??? Rash, skin 09/25/2012   ??? Slow transit constipation 08/13/2014   ??? Umbilical hernia 05/08/2013      Past Surgical History:   Procedure Laterality Date   ??? HX ORTHOPAEDIC  1998     knee replacement right   ??? HX ORTHOPAEDIC  2000    screw in foot left   ??? HX OTHER SURGICAL      colonoscopy     Current Outpatient Prescriptions   Medication Sig Dispense Refill   ??? diclofenac EC (VOLTAREN) 75 mg EC tablet take 1 tablet by mouth twice a day with food 60 Tab 3   ??? triamterene-hydroCHLOROthiazide (DYAZIDE) 37.5-25 mg per capsule take 1 capsule by mouth once daily 30 Cap 3   ??? JANUMET 50-500 mg per tablet take 1 tablet by mouth twice a day with meals 60 Tab 6   ??? oxyCODONE-acetaminophen (PERCOCET 10) 10-325 mg per tablet take 1/2 TO 1 tablet once daily if needed for pain  0   ??? COMBIVENT RESPIMAT 20-100 mcg/actuation inhaler inhale 1 puff by mouth every 6 hours 4 Inhaler 6   ??? simvastatin (ZOCOR) 20 mg tablet take 1 tablet by mouth every other day then take 1/2 ALL OTHER DAYS 30 Tab 6   ??? metoprolol tartrate (LOPRESSOR) 50 mg tablet take 1 tablet by mouth twice a day 60 Tab 7   ??? diclofenac EC (VOLTAREN)  75 mg EC tablet   0   ??? COL-RITE 100 mg capsule   0   ??? ipratropium-albuterol (COMBIVENT RESPIMAT) 20-100 mcg/actuation inhaler inhale 1 puff by mouth every 6 hours 1 Inhaler 6   ??? promethazine-codeine (PHENERGAN WITH CODEINE) 6.25-10 mg/5 mL syrup Take 5 mL by mouth four (4) times daily as needed for Cough. Max Daily Amount: 20 mL. Indications: COUGH 120 mL 0   ??? lisinopril (PRINIVIL, ZESTRIL) 10 mg tablet take 1 tablet by mouth once daily 30 Tab 6   ??? diclofenac (VOLTAREN) 1 % gel Apply 2 g to affected area four (4) times daily. 100 g 6   ??? calcium-cholecalciferol, D3, (CALTRATE 600+D) tablet Take 1 Tab by mouth two (2) times a day. 60 Tab 11   ??? HYDROcodone-acetaminophen (NORCO) 7.5-325 mg per tablet Take 1 Tab by mouth every eight (8) hours as needed for Pain. Max Daily Amount: 3 Tabs. 90 Tab 0   ??? FLUZONE HIGH-DOSE 2015-16, PF, syrg injection   0   ??? omeprazole (PRILOSEC) 20 mg capsule take 1 capsule by mouth twice a day before meals 60 Cap 6   ??? FREESTYLE LANCETS 28 gauge misc   1    ??? methimazole (TAPAZOLE) 5 mg tablet Take 5 mg by mouth daily.     ??? glucose blood VI test strips (FREESTYLE LITE STRIPS) strip Test twice daily. 1 Package 11   ??? Lancets (FREESTYLE LANCETS) misc Test blood sugar 4 times a day 1 Package 11   ??? cyanocobalamin 1,000 mcg tablet Take 1,000 mcg by mouth daily.     ??? aspirin 81 mg tablet Take 81 mg by mouth.       No Known Allergies  Family History   Problem Relation Age of Onset   ??? Diabetes Mother    ??? Heart Disease Mother      rheumatic fever   ??? Breast Cancer Mother    ??? Cancer Father      lung   ??? Cancer Sister      colon & breast   ??? Breast Cancer Sister    ??? Cancer Brother      throat     Social History   Substance Use Topics   ??? Smoking status: Former Smoker     Packs/day: 0.25     Years: 1.00     Types: Cigarettes     Quit date: 06/03/1991   ??? Smokeless tobacco: Never Used   ??? Alcohol use No     Patient Active Problem List   Diagnosis Code   ??? HTN, goal below 130/80 I10   ??? Diabetes mellitus type II, uncontrolled (HCC) E11.65   ??? 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, adult (HCC) E66.01, Z68.44   ??? Neuropathic arthritis M14.60   ??? Elevated BUN R79.9   ??? Low TSH level R94.6   ??? Abnormal urine finding R82.90   ??? Acute bronchitis J20.9   ??? Acid reflux K21.9   ??? Hypercholesterolemia E78.00   ??? Rash, skin R21   ??? Lung nodule seen on imaging study R91.1   ??? Intrathoracic goiter E04.9   ??? Chronic right hip pain M25.551, G89.29   ??? Abdominal pain, chronic, right lower quadrant R10.31, G89.29   ??? Gallbladder polyp K82.4   ??? Umbilical hernia K42.9   ??? Abdominal pain R10.9   ??? Chronic pain disorder G89.4   ??? Microalbuminuria R80.9   ??? Abnormal finding on EKG R94.31   ??? Need for diphtheria-tetanus-pertussis (  Tdap) vaccine, adult/adolescent Z23   ??? Slow transit constipation K59.01   ??? Chronic pain of left knee M25.562, G89.29   ??? Osteopenia M85.80         Depression Risk Factor Screening:     PHQ 2 / 9, over the last two weeks 05/03/2015    Little interest or pleasure in doing things Not at all   Feeling down, depressed or hopeless Not at all   Total Score PHQ 2 0     Alcohol Risk Factor Screening:   On any occasion during the past 3 months, have you had more than 3 drinks containing alcohol?  No    Do you average more than 7 drinks per week?  No    Functional Ability and Level of Safety:     Hearing Loss   normal-to-mild    Activities of Daily Living   Self-care.   Requires assistance with: no ADLs    Fall Risk     Fall Risk Assessment, last 12 mths 05/03/2015   Able to walk? Yes   Fall in past 12 months? No     Abuse Screen   Patient is not abused    Review of Systems   Constitutional: negative  Eyes: negative  Ears, nose, mouth, throat, and face: negative  Respiratory: negative  Cardiovascular: negative  Gastrointestinal: negative  Genitourinary:negative  Integument/breast: negative  Hematologic/lymphatic: negative  Musculoskeletal:negative  Neurological: negative  Behavioral/Psych: negative  Endocrine: negative  Allergic/Immunologic: negative    Physical Examination     No exam data present    Evaluation of Cognitive Function:  Mood/affect:  happy  Appearance: age appropriate and within normal Limits  Family member/caregiver input: none    Visit Vitals   ??? BP 146/69 (BP 1 Location: Left arm, BP Patient Position: At rest)   ??? Pulse 71   ??? Temp 98 ??F (36.7 ??C) (Oral)   ??? Resp 12   ??? Ht 5\' 4"  (1.626 m)   ??? Wt 335 lb (152 kg)   ??? LMP  (LMP Unknown)   ??? SpO2 94%   ??? BMI 57.5 kg/m2     General:  Alert, cooperative, no distress, appears stated age.   Head:  Normocephalic, without obvious abnormality, atraumatic.   Eyes:  Conjunctivae/corneas clear. PERRL, EOMs intact. Fundi benign.   Ears:  Normal TMs and external ear canals both ears.   Nose: Nares normal. Septum midline. Mucosa normal. No drainage or sinus tenderness.   Throat: Lips, mucosa, and tongue normal. Teeth and gums normal.   Neck: Supple, symmetrical, trachea midline, no adenopathy, thyroid: no  enlargement/tenderness/nodules, no carotid bruit and no JVD.   Back:   Symmetric, no curvature. ROM normal. No CVA tenderness.   Lungs:   Clear to auscultation bilaterally.   Chest wall:  No tenderness or deformity.   Heart:  Regular rate and rhythm, S1, S2 normal, no murmur, click, rub or gallop.   Breast Exam:  No tenderness, masses, or nipple abnormality.   Abdomen:   Soft, non-tender. Bowel sounds normal. No masses,  No organomegaly.   Genitalia:  Normal female without lesion, discharge or tenderness.   Rectal:  Normal tone,  no masses or tenderness  Guaiac negative stool.   Extremities: Extremities normal, atraumatic, no cyanosis or edema.   Pulses: 2+ and symmetric all extremities.   Skin: Skin color, texture, turgor normal. No rashes or lesions.   Lymph nodes: Cervical, supraclavicular, and axillary nodes normal.   Neurologic: CNII-XII intact. Normal  strength, sensation and reflexes throughout.       Patient Care Team:  Purnell Shoemaker, MD as PCP - General (Family Practice)  Melina Modena, MD (Endocrinology)  Diane Karen Kitchens  Festus Holts  [place] Beckie Salts, MD as Physician (Cardiology)    Advice/Referrals/Counseling   Education and counseling provided:    Are appropriate based on today's review and evaluation  End-of-Life planning (with patient's consent): Initial ACP discussion, pt wants the Son Merri Brunette [phone] as SDM,  Pneumococcal Vaccuptodateine, uptodate  Influenza Vaccine, uptodate  Hepatitis B Vaccine, declined    Colorectal cancer screening tests, ordered today  Cardiovascular screening blood test, addressed  Bone mass measurement (DEXA), not ordered await for approval  Screening for glaucoma, done  Diabetes screening test, done today, +++ controlled hx of it  Medical nutrition therapy for individuals with diabetes or renal disease, addressed info given  Diabetes outpatient self-management training services, informed        Assessment/Plan    Kattia was seen today for welcome to medicare.    Diagnoses and all orders for this visit:    Routine general medical examination at a health care facility  -     Depression Screen Annual    Diabetes mellitus without complication (HCC)  -     REFERRAL TO OPTOMETRY    HTN, goal below 130/80    Hypercholesterolemia    Osteopenia    Screening for alcoholism    Encounter for immunization  -     Pneumococcal Admin (G0009)  -     Pneumococcal Polysaccharide Vaccine    Screen for colon cancer  -     REFERRAL FOR COLONOSCOPY    Screening for depression    Encounter for screening fecal occult blood testing  -     OCCULT BLOOD, IMMUNOASSAY (FIT)    On aspirin at home  -     aspirin delayed-release 81 mg tablet; Take 1 Tab by mouth daily.    ACP (advance care planning)    Need for pneumococcal vaccination  -     Pneumococcal Polysaccharide Vaccine    Need for diphtheria-tetanus-pertussis (Tdap) vaccine, adult/adolescent    Other orders  -     Cancel: pneumococcal 13 val conj dip (PREVNAR-13) 0.5 mL syrg injection; 0.5 mL by IntraMUSCular route once for 1 dose.        Was advised to stay at a healthy weight, which will lower the risk for many problems, such as the current obesity, less chance of developing diabetes, heart disease, and high blood pressure.     Was also told to get at least 30 minutes of physical activity on most days of the week, was told to start with daily brisk walking as a good choice, and then later on can add and do other activities, such as running, swimming, cycling, or playing tennis or team sports for a better outcome.    No alcohol, Not tobacco smoke, and not allowing others to smoke around you,      lab results and schedule of future lab studies reviewed with patient  A lean diet also advised in addition of avoiding fast foods, in additions his current medications and their side effects reviewed with the patient  Pt agreed w/ recommendations

## 2015-05-03 NOTE — ACP (Advance Care Planning) (Addendum)
Length of ACP Conversation in minutes:  16 minutes    Authorized Management consultant (if patient is incapable of making informed decisions):   This person is:   Other Engineering geologist which would be her  Son Merri Brunette [phone]        For Patients with Decision Making Capacity:   Values/Goals: Exploration of values, goals, and preferences if recovery is not expected, even with continued medical treatment in the event of:  Imminent death  Severe, permanent brain injury  "In these circumstances, what matters most to you?"  Care focused more on comfort or quality of life and   Care focused more on quantity (length) of life as well.      Conversation Outcomes / Follow-Up Plan:   Recommended completion of Advance Directive form after review of ACP materials and conversation with prospective healthcare agent   Recommended communicating the plan and making copies for the healthcare agent, personal physician, and others as appropriate (e.g., health system)  Recommended review of completed ACP document annually or upon change in health status

## 2015-05-03 NOTE — Progress Notes (Signed)
Allison Newman        Name and DOB verified        Chief Complaint   Patient presents with   ??? Welcome To Medicare         Advance Directives Care Planning Information given, patient acknowledged understanding.

## 2015-05-05 MED ORDER — GLYCOPYRROLATE 9 MCG-FORMOTEROL 4.8 MCG HFA AEROSOL INHALER
Freq: Two times a day (BID) | RESPIRATORY_TRACT | 6 refills | Status: DC
Start: 2015-05-05 — End: 2016-04-19

## 2015-05-05 NOTE — Telephone Encounter (Signed)
Allison Newman calling from [facility] Coverage [phone]- need clinical informations fax to [phone] for refills on Pt Janumet tablet and Combivent inhaler.

## 2015-05-05 NOTE — Addendum Note (Signed)
Addended by: Yevonne Pax on: 05/05/2015 01:33 PM      Modules accepted: Orders

## 2015-05-05 NOTE — Telephone Encounter (Signed)
Will fax paper work as requested

## 2015-05-06 NOTE — Telephone Encounter (Signed)
Issac from [facility] would like a return call to ask a few clinical questions  Ph number is [phone] option 3  Reference number is : [id number] & [id number]

## 2015-05-06 NOTE — Telephone Encounter (Signed)
Returned call to  [facility], spoke with [place].  Stated that pts janumet was approved, pt notified

## 2015-05-07 NOTE — Telephone Encounter (Signed)
[facility] phoned spoke with Synetta Failnita she stated will send response via fax.

## 2015-05-07 NOTE — Telephone Encounter (Signed)
Caller is Wynona CanesChristine, from [facility], requesting clinical information for medication Diclofenac, please call p#[phone]

## 2015-05-07 NOTE — Telephone Encounter (Signed)
Patient phoned informed via voicemail due to no answer Diclofenac has been approved if any question call office.

## 2015-05-12 ENCOUNTER — Inpatient Hospital Stay: Admit: 2015-05-12 | Payer: MEDICARE | Primary: Geriatric Medicine

## 2015-05-12 ENCOUNTER — Encounter

## 2015-05-12 DIAGNOSIS — M5136 Other intervertebral disc degeneration, lumbar region: Secondary | ICD-10-CM

## 2015-05-13 LAB — OCCULT BLOOD IMMUNOASSAY,DIAGNOSTIC: Occult blood fecal, by IA: NEGATIVE

## 2015-05-25 MED ORDER — LISINOPRIL 10 MG TAB
10 mg | ORAL_TABLET | ORAL | 6 refills | Status: DC
Start: 2015-05-25 — End: 2015-08-25

## 2015-06-03 ENCOUNTER — Encounter

## 2015-06-03 MED ORDER — NAPROXEN 500 MG TAB
500 mg | ORAL_TABLET | Freq: Two times a day (BID) | ORAL | 2 refills | Status: DC | PRN
Start: 2015-06-03 — End: 2016-02-02

## 2015-06-03 NOTE — Progress Notes (Signed)
Order placed for naproxen 50mg  1 tab twice daily as needed , per Verbal Order from Dr. Demetrios IsaacsAshrafi on 06/03/2015 due to insurance formulary change  Pt notified, instructions given

## 2015-07-21 ENCOUNTER — Encounter: Attending: Family Medicine | Primary: Geriatric Medicine

## 2015-08-03 MED ORDER — TRIAMTERENE-HYDROCHLOROTHIAZIDE 37.5 MG-25 MG CAP
ORAL_CAPSULE | ORAL | 3 refills | Status: DC
Start: 2015-08-03 — End: 2015-11-30

## 2015-08-24 MED ORDER — METOPROLOL TARTRATE 50 MG TAB
50 mg | ORAL_TABLET | ORAL | 7 refills | Status: DC
Start: 2015-08-24 — End: 2016-05-21

## 2015-08-25 ENCOUNTER — Ambulatory Visit: Admit: 2015-08-25 | Discharge: 2015-08-25 | Payer: MEDICARE | Attending: Family Medicine | Primary: Geriatric Medicine

## 2015-08-25 ENCOUNTER — Inpatient Hospital Stay: Admit: 2015-09-17 | Payer: MEDICARE | Primary: Geriatric Medicine

## 2015-08-25 DIAGNOSIS — E119 Type 2 diabetes mellitus without complications: Secondary | ICD-10-CM

## 2015-08-25 MED ORDER — PNEUMOCOCCAL 13-VAL CONJ VACCINE-DIP CRM (PF) 0.5 ML IM SYRINGE
0.5 mL | Freq: Once | INTRAMUSCULAR | 0 refills | Status: AC
Start: 2015-08-25 — End: 2015-08-25

## 2015-08-25 MED ORDER — LUBIPROSTONE 24 MCG CAP
24 mcg | ORAL_CAPSULE | Freq: Two times a day (BID) | ORAL | 3 refills | Status: AC
Start: 2015-08-25 — End: 2015-12-23

## 2015-08-25 NOTE — Progress Notes (Signed)
Langston MaskerJean C Budnick        Name and DOB verified           Chief Complaint   Patient presents with   ??? Diabetes     f/u         Health Maintenance reviewed-discussed with patient.

## 2015-08-25 NOTE — Progress Notes (Signed)
HISTORY OF PRESENT ILLNESS  Allison Newman is a 75 y.o. female.  HPI   DMtype II    Patient with morbid obesity state with past medical history of hypercholesterolemia hypertension, controlled diabetic state and chronic constipation MiraLAX not helping currently she is compliant w/ meds, having no diabetic diet, and not doing much of daily exercise, obtains home glucose monitoring averaging  120's  . No Rf needed for today. Denies any tingling sensation, polyurea and polydipsia, last a1c was at target 6.8% and today is at 6.3%    . Last podiatry visit 2017  And last eye exam was 2017  Last urine microalbumin 2017 and was normal  . Feeling better since the last visit.      Current Outpatient Prescriptions   Medication Sig Dispense Refill   ??? diclofenac EC (VOLTAREN) 75 mg EC tablet   0   ??? metoprolol tartrate (LOPRESSOR) 50 mg tablet take 1 tablet by mouth twice a day 60 Tab 7   ??? triamterene-hydroCHLOROthiazide (DYAZIDE) 37.5-25 mg per capsule take 1 capsule by mouth once daily 30 Cap 3   ??? naproxen (NAPROSYN) 500 mg tablet Take 1 Tab by mouth two (2) times daily as needed. Indications: Pain 60 Tab 2   ??? glycopyrrolate-formoterol (BEVESPI AEROSPHERE) 9-4.8 mcg HFAA Take 2 Puffs by inhalation two (2) times a day. 1 Inhaler 6   ??? aspirin delayed-release 81 mg tablet Take 1 Tab by mouth daily. 30 Tab 11   ??? JANUMET 50-500 mg per tablet take 1 tablet by mouth twice a day with meals 60 Tab 6   ??? oxyCODONE-acetaminophen (PERCOCET 10) 10-325 mg per tablet take 1/2 TO 1 tablet once daily if needed for pain  0   ??? simvastatin (ZOCOR) 20 mg tablet take 1 tablet by mouth every other day then take 1/2 ALL OTHER DAYS 30 Tab 6   ??? COL-RITE 100 mg capsule   0   ??? lisinopril (PRINIVIL, ZESTRIL) 10 mg tablet take 1 tablet by mouth once daily 30 Tab 6   ??? diclofenac (VOLTAREN) 1 % gel Apply 2 g to affected area four (4) times daily. 100 g 6   ??? calcium-cholecalciferol, D3, (CALTRATE 600+D) tablet Take 1 Tab by mouth  two (2) times a day. 60 Tab 11   ??? HYDROcodone-acetaminophen (NORCO) 7.5-325 mg per tablet Take 1 Tab by mouth every eight (8) hours as needed for Pain. Max Daily Amount: 3 Tabs. 90 Tab 0   ??? FLUZONE HIGH-DOSE 2015-16, PF, syrg injection   0   ??? FREESTYLE LANCETS 28 gauge misc   1   ??? methimazole (TAPAZOLE) 5 mg tablet Take 5 mg by mouth daily.     ??? glucose blood VI test strips (FREESTYLE LITE STRIPS) strip Test twice daily. 1 Package 11   ??? Lancets (FREESTYLE LANCETS) misc Test blood sugar 4 times a day 1 Package 11   ??? cyanocobalamin 1,000 mcg tablet Take 1,000 mcg by mouth daily.     ??? aspirin 81 mg tablet Take 81 mg by mouth.     ??? promethazine-codeine (PHENERGAN WITH CODEINE) 6.25-10 mg/5 mL syrup Take 5 mL by mouth four (4) times daily as needed for Cough. Max Daily Amount: 20 mL. Indications: COUGH 120 mL 0   ??? omeprazole (PRILOSEC) 20 mg capsule take 1 capsule by mouth twice a day before meals 60 Cap 6     No Known Allergies  Past Medical History:   Diagnosis Date   ??? Abdominal  pain 05/08/2013   ??? Abnormal finding on EKG 11/17/2013   ??? ACP (advance care planning) 05/03/2015   ??? Arthritis    ??? 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 Chronic pain of left knee 08/13/2014   ??? Chronic right hip pain 03/17/2013   ??? Diabetes (HCC)    ??? Elevated BUN 09/13/2011   ??? Gallbladder polyp 04/08/2013   ??? Gastrointestinal disorder     GERD   ??? GERD (gastroesophageal reflux disease)    ??? Hypercholesterolemia    ??? Hypertension    ??? 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 Microalbuminuria 08/29/2013   ??? 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 On aspirin at home 05/03/2015   ??? Other unknown and unspecified cause of morbidity or mortality     hx of bronchitis   ??? Rash, skin 09/25/2012   ??? Slow transit constipation 08/13/2014   ??? Umbilical hernia 05/08/2013     Past Surgical History:   Procedure Laterality Date   ??? HX ORTHOPAEDIC  1998     knee replacement right   ??? HX ORTHOPAEDIC  2000    screw in foot left   ??? HX OTHER SURGICAL      colonoscopy     Family History   Problem Relation Age of Onset   ??? Diabetes Mother    ??? Heart Disease Mother      rheumatic fever   ??? Breast Cancer Mother    ??? Cancer Father      lung   ??? Cancer Sister      colon & breast   ??? Breast Cancer Sister    ??? Cancer Brother      throat     Social History   Substance Use Topics   ??? Smoking status: Former Smoker     Packs/day: 0.25     Years: 1.00     Types: Cigarettes     Quit date: 06/03/1991   ??? Smokeless tobacco: Never Used   ??? Alcohol use No      Lab Results  Component Value Date/Time   WBC 5.2 03/24/2015 09:28 AM   HGB 11.6 03/24/2015 09:28 AM   HCT 36.6 03/24/2015 09:28 AM   PLATELET 172 03/24/2015 09:28 AM   MCV 90 03/24/2015 09:28 AM     Lab Results  Component Value Date/Time   Hemoglobin A1c 6.8 03/24/2015 09:28 AM   Hemoglobin A1c 6.5 01/22/2014 08:40 AM   Hemoglobin A1c 6.6 11/17/2013 09:01 AM   Hemoglobin A1c, External 6.6 07/29/2014   Glucose 164 08/13/2014 08:15 AM   Glucose (POC) 143 01/03/2008 09:21 AM   Microalb/Creat ratio (ug/mg creat.) 6.5 03/24/2015 11:00 AM   LDL, calculated 94 08/13/2014 08:15 AM   Creatinine (POC) 0.8 04/11/2013 11:28 AM   Creatinine 1.20 08/13/2014 08:15 AM      Lab Results  Component Value Date/Time   Cholesterol, total 170 08/13/2014 08:15 AM   HDL Cholesterol 51 08/13/2014 08:15 AM   LDL, calculated 94 08/13/2014 08:15 AM   LDL-C, External 110 07/29/2014   Triglyceride 124 08/13/2014 08:15 AM   Lab Results  Component Value Date/Time   GFR est non-AA 45 08/13/2014 08:15 AM   GFR, non-AA (POC) >60 04/11/2013 11:28 AM   GFR est AA 52 08/13/2014 08:15 AM   GFR-AA (POC) >60 04/11/2013 11:28 AM   Creatinine 1.20 08/13/2014 08:15 AM   Creatinine (POC)  0.8 04/11/2013 11:28 AM   BUN 36 08/13/2014 08:15 AM   Sodium 146 08/13/2014 08:15 AM   Potassium 4.5 08/13/2014 08:15 AM   Chloride 106 08/13/2014 08:15 AM   CO2 24 08/13/2014 08:15 AM               Review of Systems   Constitutional: Negative for chills and fever.   HENT: Negative for ear pain and nosebleeds.    Eyes: Negative for blurred vision, pain and discharge.   Respiratory: Negative for shortness of breath.    Cardiovascular: Negative for chest pain and leg swelling.   Gastrointestinal: Negative for constipation, diarrhea, nausea and vomiting.   Genitourinary: Negative for frequency.   Musculoskeletal: Negative for joint pain.   Skin: Negative for itching and rash.   Neurological: Negative for headaches.   Psychiatric/Behavioral: Negative for depression. The patient is not nervous/anxious.        Physical Exam   Constitutional: She is oriented to person, place, and time. She appears well-developed and well-nourished.   HENT:   Head: Normocephalic and atraumatic.   Eyes: Conjunctivae and EOM are normal.   Neck: Normal range of motion. Neck supple.   Cardiovascular: Normal rate, regular rhythm and normal heart sounds.    No murmur heard.  Pulmonary/Chest: Effort normal and breath sounds normal.   Abdominal: Soft. Bowel sounds are normal. She exhibits no distension.   Musculoskeletal: Normal range of motion. She exhibits no edema.   Neurological: She is alert and oriented to person, place, and time.   Skin: No erythema.   Psychiatric: Her behavior is normal.   Nursing note and vitals reviewed.      ASSESSMENT and PLAN  Carney BernJean was seen today for diabetes.    Diagnoses and all orders for this visit:    Diabetes mellitus without complication (HCC)  -     HEMOGLOBIN A1C WITH EAG  -     pneumococcal 13 val conj dip (PREVNAR-13) 0.5 mL syrg injection; 0.5 mL by IntraMUSCular route once for 1 dose.  -     lubiPROStone (AMITIZA) 24 mcg capsule; Take 1 Cap by mouth two (2) times daily (with meals) for 120 days. 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  -     lubiPROStone (AMITIZA) 24 mcg capsule; Take 1 Cap by mouth two (2) times daily (with meals) for 120 days. Indications: Constipation Predominant Irritable Bowel Syndrome    Slow transit constipation     increase physical acitivity, stool softner such as colace 100 mg one tab tid w/ meals, metamucil powder one cup twice daily, avoid fatty, fast and fried foods, donot miss meals, small meals and more frequent avoid late dinner avoid overeating, increase po fluid intake daily, compliance advised RTC if worsen  Pt agreed

## 2015-08-26 LAB — HEMOGLOBIN A1C WITH EAG
Estimated average glucose: 134 mg/dL
Hemoglobin A1c: 6.3 % — ABNORMAL HIGH (ref 4.8–5.6)

## 2015-09-04 MED ORDER — COL-RITE 100 MG CAPSULE
100 mg | ORAL_CAPSULE | ORAL | 3 refills | Status: DC
Start: 2015-09-04 — End: 2016-12-21

## 2015-09-13 MED ORDER — CALCIUM 600 + D(3) 600 MG-10 MCG (400 UNIT) TABLET
600 mg-10 mcg (400 unit) | ORAL_TABLET | ORAL | 11 refills | Status: DC
Start: 2015-09-13 — End: 2016-09-17

## 2015-10-25 MED ORDER — JANUMET 50 MG-1,000 MG TABLET
50-1000 mg | ORAL_TABLET | ORAL | 6 refills | Status: DC
Start: 2015-10-25 — End: 2015-11-02

## 2015-10-27 NOTE — Telephone Encounter (Signed)
Pt.would like a call back regarding a medication she is currently taking she can be reached @ [phone]

## 2015-10-28 NOTE — Telephone Encounter (Signed)
Returned call to pt.  Pt stated she is currently in the "donut Hole"  And can not afford her janumet,  Requesting to be changed to metformin.  Message sent to Dr Demetrios Isaacs

## 2015-11-02 MED ORDER — METFORMIN 1,000 MG TAB
1000 mg | ORAL_TABLET | Freq: Two times a day (BID) | ORAL | 4 refills | Status: DC
Start: 2015-11-02 — End: 2016-03-02

## 2015-11-02 NOTE — Telephone Encounter (Signed)
Please infomr the pt the new med called in to her pharm

## 2015-11-04 NOTE — Telephone Encounter (Signed)
Patient phoned and informed she acknowledged understanding.

## 2015-12-01 MED ORDER — TRIAMTERENE-HYDROCHLOROTHIAZIDE 37.5 MG-25 MG CAP
ORAL_CAPSULE | ORAL | 3 refills | Status: DC
Start: 2015-12-01 — End: 2016-03-02

## 2015-12-21 MED ORDER — LISINOPRIL 10 MG TAB
10 mg | ORAL_TABLET | ORAL | 6 refills | Status: DC
Start: 2015-12-21 — End: 2016-03-02

## 2016-01-19 MED ORDER — SIMVASTATIN 20 MG TAB
20 mg | ORAL_TABLET | ORAL | 6 refills | Status: DC
Start: 2016-01-19 — End: 2016-03-02

## 2016-02-02 MED ORDER — DICLOFENAC 75 MG TAB, DELAYED RELEASE
75 mg | ORAL_TABLET | ORAL | 3 refills | Status: DC
Start: 2016-02-02 — End: 2016-04-19

## 2016-02-04 NOTE — Telephone Encounter (Signed)
Patient phoned she is requesting medical records for upcoming appointment with Pulmonary.  Informed her a medical release form must be completed and signed she acknowledged understanding.

## 2016-02-04 NOTE — Telephone Encounter (Signed)
Pt.would like for some of her records to be faxed to the Pulmonary Dr.'s office(Dr.Mistretta) [facility].His phone number is [phone] and ([phone]

## 2016-02-04 NOTE — Telephone Encounter (Signed)
-----   Message from Cynthia T Jackson sent at 11/30/20Nicola Police17  7:16 PM EST -----  Regarding: Dr. Michelene HeadyAshrafi/Telephone  Hello,    Pt states she needs to see a pulmonary doctor and is asking for a  recommendation of a pulmonary specialist as soon as possible.  Best contact number is [phone].    Thanks,  Evie Lacksynthia Jackson

## 2016-02-04 NOTE — Telephone Encounter (Signed)
Patient phoned informed medical records release form must be completed and signed prior to sending she acknowledged understanding.

## 2016-03-01 MED ORDER — UMECLIDINIUM 62.5 MCG-VILANTEROL 25 MCG/ACTUATION POWDR FOR INHALATION
Freq: Every day | RESPIRATORY_TRACT | 11 refills | Status: DC
Start: 2016-03-01 — End: 2016-04-19

## 2016-03-02 ENCOUNTER — Ambulatory Visit: Admit: 2016-03-02 | Discharge: 2016-03-02 | Payer: MEDICARE | Attending: Family Medicine | Primary: Geriatric Medicine

## 2016-03-02 ENCOUNTER — Inpatient Hospital Stay: Admit: 2016-04-18 | Payer: MEDICARE | Primary: Geriatric Medicine

## 2016-03-02 DIAGNOSIS — E119 Type 2 diabetes mellitus without complications: Secondary | ICD-10-CM

## 2016-03-02 DIAGNOSIS — L989 Disorder of the skin and subcutaneous tissue, unspecified: Secondary | ICD-10-CM

## 2016-03-02 LAB — AMB POC HEMOGLOBIN A1C: Hemoglobin A1c (POC): 7.7 %

## 2016-03-02 MED ORDER — TRIAMTERENE-HYDROCHLOROTHIAZIDE 37.5 MG-25 MG CAP
ORAL_CAPSULE | ORAL | 3 refills | Status: DC
Start: 2016-03-02 — End: 2016-06-17

## 2016-03-02 MED ORDER — SITAGLIPTIN-METFORMIN 50 MG-500 MG TAB
50-500 mg | ORAL_TABLET | Freq: Every day | ORAL | 1 refills | Status: DC
Start: 2016-03-02 — End: 2016-06-22

## 2016-03-02 MED ORDER — ROSUVASTATIN 20 MG TAB
20 mg | ORAL_TABLET | Freq: Every evening | ORAL | 6 refills | Status: DC
Start: 2016-03-02 — End: 2017-03-23

## 2016-03-02 MED ORDER — CEPHALEXIN 500 MG CAP
500 mg | ORAL_CAPSULE | Freq: Four times a day (QID) | ORAL | 0 refills | Status: AC
Start: 2016-03-02 — End: 2016-03-12

## 2016-03-02 NOTE — Progress Notes (Signed)
HISTORY OF PRESENT ILLNESS  Allison Newman is a 75 y.o. female.  HPI   Copd  Was told to by the lung DOC, was exposed to heavy cigs abuse for many many yrs, pt herself smoked only 2 yrs one pack would last her for one month, quit >61yrs,  DMtype II    Not Compliant w/ meds, bothering her stomach, constipation from the current pain meds, was told take stool softener but is not helping at this time,   She is having + diabetic diet, lost 12 pounds since the last visit,  no home glucose monitoring, ++ Rf needed for today, does not want to be on Metformin was taken off secondary to the cost. Denies any tingling sensation, polyurea and polydipsia, last a1c was not at target 6.2% and today is at 7.7%%,   Last podiatry visit 2017 And last eye exam was 2017  Last urine microalbumin 2017 and was normal,   finger lesion  Started as a blister few weeks ago,not going away now with some granulation, not the first time, the first was noticed was > 6 months,  no beauty shop visit, not painful, no bleeding no pus but was blistery opened it up picked on it, now is red dry granulation tissue   Has had some coverages  Feeling better since the last visit.    Current Outpatient Prescriptions   Medication Sig Dispense Refill   ??? cyclobenzaprine (FLEXERIL) 10 mg tablet Take  by mouth two (2) times daily as needed.  0   ??? FLUAD 2017-2018, 65 YR UP,,PF, syrg injection inject 0.5 milliliter intramuscularly  0   ??? COMBIVENT RESPIMAT 20-100 mcg/actuation inhaler 1 Puff every six (6) hours as needed.  0   ??? oxyCODONE IR (ROXICODONE) 10 mg tab immediate release tablet take 1 tablet by mouth UP TO three times a day  0   ??? diclofenac EC (VOLTAREN) 75 mg EC tablet take 1 tablet by mouth twice a day with food 60 Tab 3   ??? triamterene-hydroCHLOROthiazide (DYAZIDE) 37.5-25 mg per capsule take 1 tablet once daily 30 Cap 3   ??? metFORMIN (GLUCOPHAGE) 1,000 mg tablet Take 1 Tab by mouth two (2) times daily (with meals). 60 Tab 4    ??? CALCIUM 600 + D tablet take 1 tablet by mouth twice a day 60 Tab 11   ??? COL-RITE 100 mg capsule take 1 capsule by mouth three times a day if needed for constipation 90 Cap 3   ??? metoprolol tartrate (LOPRESSOR) 50 mg tablet take 1 tablet by mouth twice a day 60 Tab 7   ??? aspirin delayed-release 81 mg tablet Take 1 Tab by mouth daily. 30 Tab 11   ??? lisinopril (PRINIVIL, ZESTRIL) 10 mg tablet take 1 tablet by mouth once daily 30 Tab 6   ??? FLUZONE HIGH-DOSE 2015-16, PF, syrg injection   0   ??? FREESTYLE LANCETS 28 gauge misc   1   ??? methimazole (TAPAZOLE) 5 mg tablet Take 5 mg by mouth daily.     ??? glucose blood VI test strips (FREESTYLE LITE STRIPS) strip Test twice daily. 1 Package 11   ??? Lancets (FREESTYLE LANCETS) misc Test blood sugar 4 times a day 1 Package 11   ??? oxyCODONE IR (OXY-IR) 15 mg immediate release tablet TAKE 1 TABLET TWICE A DAY FOR CHRONIC PAIN  0   ??? umeclidinium-vilanterol (ANORO ELLIPTA) 62.5-25 mcg/actuation inhaler Take 1 Puff by inhalation daily. (Patient not taking: Reported on 03/02/2016) 1  Inhaler 11   ??? glycopyrrolate-formoterol (BEVESPI AEROSPHERE) 9-4.8 mcg HFAA Take 2 Puffs by inhalation two (2) times a day. (Patient not taking: Reported on 03/02/2016) 1 Inhaler 6   ??? oxyCODONE-acetaminophen (PERCOCET 10) 10-325 mg per tablet take 1/2 TO 1 tablet once daily if needed for pain  0   ??? promethazine-codeine (PHENERGAN WITH CODEINE) 6.25-10 mg/5 mL syrup Take 5 mL by mouth four (4) times daily as needed for Cough. Max Daily Amount: 20 mL. Indications: COUGH (Patient not taking: Reported on 03/02/2016) 120 mL 0   ??? diclofenac (VOLTAREN) 1 % gel Apply 2 g to affected area four (4) times daily. (Patient not taking: Reported on 03/02/2016) 100 g 6   ??? HYDROcodone-acetaminophen (NORCO) 7.5-325 mg per tablet Take 1 Tab by mouth every eight (8) hours as needed for Pain. Max Daily Amount: 3 Tabs. (Patient not taking: Reported on 03/02/2016) 90 Tab 0    ??? omeprazole (PRILOSEC) 20 mg capsule take 1 capsule by mouth twice a day before meals (Patient not taking: Reported on 03/02/2016) 60 Cap 6   ??? cyanocobalamin 1,000 mcg tablet Take 1,000 mcg by mouth daily.       No Known Allergies  Past Medical History:   Diagnosis Date   ??? Abdominal pain 05/08/2013   ??? Abnormal finding on EKG 11/17/2013   ??? ACP (advance care planning) 05/03/2015   ??? Arthritis    ??? 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 Chronic pain of left knee 08/13/2014   ??? Chronic right hip pain 03/17/2013   ??? Diabetes (HCC)    ??? Elevated BUN 09/13/2011   ??? Gallbladder polyp 04/08/2013   ??? Gastrointestinal disorder     GERD   ??? GERD (gastroesophageal reflux disease)    ??? Hypercholesterolemia    ??? Hypertension    ??? 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 Microalbuminuria 08/29/2013   ??? 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 On aspirin at home 05/03/2015   ??? Other unknown and unspecified cause of morbidity or mortality     hx of bronchitis   ??? Rash, skin 09/25/2012   ??? Slow transit constipation 08/13/2014   ??? Umbilical hernia 05/08/2013     Past Surgical History:   Procedure Laterality Date   ??? 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 HX ORTHOPAEDIC  2000    screw in foot left   ??? HX OTHER SURGICAL      colonoscopy     Family History   Problem Relation Age of Onset   ??? Diabetes Mother    ??? Heart Disease Mother      rheumatic fever   ??? Breast Cancer Mother    ??? Cancer Father      lung   ??? Cancer Sister      colon & breast   ??? Breast Cancer Sister    ??? Cancer Brother      throat     Social History   Substance Use Topics   ??? Smoking status: Former Smoker     Packs/day: 0.25     Years: 1.00     Types: Cigarettes     Quit date: 06/03/1991   ??? Smokeless tobacco: Never Used   ??? Alcohol use No      Lab Results  Component  Value Date/Time   WBC 5.2 03/24/2015 09:28 AM    HGB 11.6 03/24/2015 09:28 AM   HCT 36.6 03/24/2015 09:28 AM   PLATELET 172 03/24/2015 09:28 AM   MCV 90 03/24/2015 09:28 AM     Lab Results  Component Value Date/Time   Hemoglobin A1c 6.3 08/25/2015 03:55 PM   Hemoglobin A1c 6.8 03/24/2015 09:28 AM   Hemoglobin A1c 6.5 01/22/2014 08:40 AM   Hemoglobin A1c, External 6.6 07/29/2014   Glucose 164 08/13/2014 08:15 AM   Glucose (POC) 143 01/03/2008 09:21 AM   Microalb/Creat ratio (ug/mg creat.) 6.5 03/24/2015 11:00 AM   LDL, calculated 94 08/13/2014 08:15 AM   Creatinine (POC) 0.8 04/11/2013 11:28 AM   Creatinine 1.20 08/13/2014 08:15 AM      Lab Results  Component Value Date/Time   Cholesterol, total 170 08/13/2014 08:15 AM   HDL Cholesterol 51 08/13/2014 08:15 AM   LDL, calculated 94 08/13/2014 08:15 AM   LDL-C, External 110 07/29/2014   Triglyceride 124 08/13/2014 08:15 AM     Lab Results  Component Value Date/Time   GFR est non-AA 45 08/13/2014 08:15 AM   GFRNA, POC >60 04/11/2013 11:28 AM   GFR est AA 52 08/13/2014 08:15 AM   GFRAA, POC >60 04/11/2013 11:28 AM   Creatinine 1.20 08/13/2014 08:15 AM   Creatinine (POC) 0.8 04/11/2013 11:28 AM   BUN 36 08/13/2014 08:15 AM   Sodium 146 08/13/2014 08:15 AM   Potassium 4.5 08/13/2014 08:15 AM   Chloride 106 08/13/2014 08:15 AM   CO2 24 08/13/2014 08:15 AM     Lab Results  Component Value Date/Time   TSH 1.390 03/24/2015 09:28 AM   T4, Total 8.9 06/27/2012 09:33 AM         Review of Systems   Constitutional: Negative for chills and fever.   HENT: Negative for ear pain and nosebleeds.    Eyes: Negative for blurred vision, pain and discharge.   Respiratory: Negative for shortness of breath.    Cardiovascular: Negative for chest pain and leg swelling.   Gastrointestinal: Negative for constipation, diarrhea, nausea and vomiting.   Genitourinary: Negative for frequency.   Musculoskeletal: Negative for joint pain.   Skin: Negative for itching and rash.   Neurological: Negative for headaches.    Psychiatric/Behavioral: Negative for depression. The patient is not nervous/anxious.        Physical Exam   Constitutional: She is oriented to person, place, and time. She appears well-developed and well-nourished.   HENT:   Head: Normocephalic and atraumatic.   Eyes: Conjunctivae and EOM are normal.   Neck: Normal range of motion. Neck supple.   Cardiovascular: Normal rate, regular rhythm and normal heart sounds.    No murmur heard.  Pulmonary/Chest: Effort normal and breath sounds normal.   Abdominal: Soft. Bowel sounds are normal. She exhibits no distension.   Musculoskeletal: Normal range of motion. She exhibits no edema.   Neurological: She is alert and oriented to person, place, and time.   Skin: No erythema.   Psychiatric: Her behavior is normal.   Nursing note and vitals reviewed.      ASSESSMENT and PLAN  Diagnoses and all orders for this visit:    1. Diabetes mellitus without complication (HCC)  -     MICROALBUMIN, UR, RAND W/ MICROALBUMIN/CREA RATIO  -     AMB POC HEMOGLOBIN A1C  -     CBC W/O DIFF  -     METABOLIC PANEL, COMPREHENSIVE  -  LIPID PANEL  -     rosuvastatin (CRESTOR) 20 mg tablet; Take 1 Tab by mouth nightly.  -     REFERRAL TO NEPHROLOGY  -     SITagliptin-metFORMIN (JANUMET) 50-500 mg per tablet; Take 1 Tab by mouth daily.  -     triamterene-hydroCHLOROthiazide (DYAZIDE) 37.5-25 mg per capsule; take 1 tablet once daily    2. Hypercholesterolemia  -     MICROALBUMIN, UR, RAND W/ MICROALBUMIN/CREA RATIO  -     AMB POC HEMOGLOBIN A1C  -     CBC W/O DIFF  -     METABOLIC PANEL, COMPREHENSIVE  -     LIPID PANEL  -     rosuvastatin (CRESTOR) 20 mg tablet; Take 1 Tab by mouth nightly.  -     REFERRAL TO NEPHROLOGY  -     SITagliptin-metFORMIN (JANUMET) 50-500 mg per tablet; Take 1 Tab by mouth daily.    3. Microalbuminuria  -     MICROALBUMIN, UR, RAND W/ MICROALBUMIN/CREA RATIO  -     AMB POC HEMOGLOBIN A1C  -     CBC W/O DIFF  -     METABOLIC PANEL, COMPREHENSIVE  -     LIPID PANEL   -     rosuvastatin (CRESTOR) 20 mg tablet; Take 1 Tab by mouth nightly.  -     REFERRAL TO NEPHROLOGY  -     SITagliptin-metFORMIN (JANUMET) 50-500 mg per tablet; Take 1 Tab by mouth daily.    4. Elevated BUN  -     MICROALBUMIN, UR, RAND W/ MICROALBUMIN/CREA RATIO  -     AMB POC HEMOGLOBIN A1C  -     CBC W/O DIFF  -     METABOLIC PANEL, COMPREHENSIVE  -     LIPID PANEL  -     rosuvastatin (CRESTOR) 20 mg tablet; Take 1 Tab by mouth nightly.  -     REFERRAL TO NEPHROLOGY  -     SITagliptin-metFORMIN (JANUMET) 50-500 mg per tablet; Take 1 Tab by mouth daily.    5. Finger lesion  -     AEROBIC BACTERIAL CULTURE  -     REFERRAL TO WOUND CARE  -     cephALEXin (KEFLEX) 500 mg capsule; Take 1 Cap by mouth four (4) times daily for 10 days.    6. 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)  -     REFERRAL TO WOUND CARE      At this time patient was told to lose weight, so that her body mass index would get into a normal level between 20-25,  increase physical activity, limit alcohol consumption, stop secondhand tobacco exposure    In addition the patient was told to start an active life style modifications, for which includes creating a an interesting delightful to do list,  such as start of a light physical activity with a brisk daily walking 30 minutes most days of the week, most likely to total of 150 minutes per week, then the patient was told to try to avoid fatty fast foods, have a low-fat low-cholesterol diet, include seafood such as adding fatty fish such as [place] Gum, Mackerel, Salmon to the diet, increase vegetables and fruits, nuts 3-4 times per week and finally have a low-salt and K rich food intake for a good 4-6 months possibly for ever for the best outcome,   All mentioned recommendations, have to be done at least most days of the weeks  for the best result,  Routine labs ordered, and the needed abnormal labs will be discussed soon and they can be repeated in 3-6 months.     In addition relevant handouts were given to the patient for a better understanding,    patient was told to call if any problems.  Patient acknowledged understanding and     Patient agreed with today's recommendation

## 2016-03-02 NOTE — Addendum Note (Signed)
Addended by: Yevonne PaxPERKINS, Shatarra Wehling M on: 03/02/2016 02:08 PM      Modules accepted: Orders

## 2016-03-02 NOTE — Progress Notes (Signed)
Name and DOB verified      Chief Complaint   Patient presents with   ??? Hypertension   ??? Diabetes       Health Maintenance reviewed-discussed with patient.        1. Have you been to the ER, urgent care clinic since your last visit?  Hospitalized since your last visit?No    2. Have you seen or consulted any other health care providers outside of the [facility] since your last visit?  Include any pap smears or colon screening. No      Patient educated on the parts of a diabetes care plan  1. Meal plan  2. Plan for staying active  3. Diabetes medicine plan  4. Plan for checking blood sugar as ordered by Dr. Purnell ShoemakerAbbas Ashrafi  5. Schedule for diabetes follow up appt as recommended by provider    Patient received diabetics education handout.

## 2016-03-02 NOTE — Patient Instructions (Signed)
Diabetes Blood Sugar Emergencies: Your Action Plan  How can you prevent a blood sugar emergency?  An important part of living with diabetes is keeping your blood sugar in your target range. You'll need to know what to do if it's too high or too low. Managing your blood sugar levels helps you avoid emergencies. This care sheet will teach you about the signs of high and low blood sugar. It will help you make an action plan with your doctor for when these signs occur.  Low blood sugar is more likely to happen if you take certain medicines for diabetes. It can also happen if you skip a meal, drink alcohol, or exercise more than usual.  You may get high blood sugar if you eat differently than you normally do. One example is eating more carbohydrate than usual. Having a cold, the flu, or other sudden illness can also cause high blood sugar levels. Levels can also rise if you miss a dose of medicine.  Any change in how you take your medicine may affect your blood sugar level. So it's important to work with your doctor before you make any changes.  Check your blood sugar  Work with your doctor to fill in the blank spaces below that apply to you.  Track your levels, know your target range, and write down ways you can get your blood sugar back in your target range. A log book can help you track your levels. Take the book to all of your medical appointments.  ?? Check your blood sugar _____ times a day, at these times:________________________________________________.  (For example: Before meals, at bedtime, before exercise, during exercise, other.)  ?? Your blood sugar target range before a meal is ___________________. Your blood sugar target range after a meal is _______________________.  ?? Do this-___________________________________________________-to get your blood sugar back within your safe range if your blood sugar results are _________________________________________. (For example: Less than 70 or above 250 mg/dL.)   Call your doctor when your blood sugar results are ___________________________________.  (For example: Less than 70 or above 250 mg/dL.)  What are the symptoms of low and high blood sugar?  Common symptoms of low blood sugar are sweating and feeling shaky, weak, hungry, or confused. Symptoms can start quickly.  Common symptoms of high blood sugar are feeling very thirsty or very hungry. You may also pass urine more often than usual. You may have blurry vision and may lose weight without trying.  But some people may have high or low blood sugar without having any symptoms. That's a good reason to check your blood sugar on a regular schedule.  What should you do if you have symptoms?  Work with your doctor to fill in the blank spaces below that apply to you.  Low blood sugar  If you have symptoms of low blood sugar, check your blood sugar. If it's below _____ ( for example, below 70), eat or drink a quick-sugar food that has about 15 grams of carbohydrate. Your goal is to get your level back to your safe range. Check your blood sugar again 15 minutes later. If it's still not in your target range, take another 15 grams of carbohydrate and check your blood sugar again in 15 minutes. Repeat this until you reach your target. Then go back to your regular testing schedule.  When you have low blood sugar, it's best to stop or reduce any physical activity until your blood sugar is back in your target range and is   stable. If you must stay active, eat or drink 30 grams of carbohydrate. Then check your blood sugar again in 15 minutes. If it's not in your target range, take another 30 grams of carbohydrates. Check your blood sugar again in 15 minutes. Keep doing this until you reach your target. You can then go back to your regular testing schedule.  If your symptoms or blood sugar levels are getting worse or have not improved after 15 minutes, seek medical care right away.   Here are some examples of quick-sugar foods with 15 grams of carbohydrate:  ?? 3 or 4 glucose tablets  ?? 1 tube of glucose gel  ?? Hard candy (such as 3 Jolly Ranchers or 5 to [employer])  ?? ?? cup to ?? cup (4 to 6 ounces) of fruit juice or regular (not diet) soda  High blood sugar  If you have symptoms of high blood sugar, check your blood sugar. Your goal is to get your level back to your target range. If it's above ______ ( for example, above 250), follow these steps:  ?? If you missed a dose of your diabetes medicine, take it now. Take only the amount of medicine that you have been prescribed. Do not take more or less medicine.  ?? Give yourself insulin if your doctor has prescribed it for high blood sugar.  ?? Test for ketones, if the doctor told you to do so. If the results of the ketone test show a moderate-to-large amount of ketones, call the doctor for advice.  ?? Wait 30 minutes after you take the extra insulin or the missed medicine.Check your blood sugar again.  If your symptoms or blood sugar levels are getting worse or have not improved after taking these steps, seek medical care right away.  Follow-up care is a key part of your treatment and safety. Be sure to make and go to all appointments, and call your doctor if you are having problems. It's also a good idea to know your test results and keep a list of the medicines you take.  Where can you learn more?  Go to [email or web address]://www.healthwise.net/GoodHelpConnections.  Enter [phone] [phone] in the search box to learn more about "Diabetes Blood Sugar Emergencies: Your Action Plan."  Current as of: May 17, 2015  Content Version: 11.4  ?? 2006-2017 [employer]. Care instructions adapted under license by [employer] (which disclaims liability or warranty for this information). If you have questions about a medical condition or this instruction, always ask your healthcare professional. [employer]wise,  Incorporated disclaims any warranty or liability for your use of this information.       Diabetes and Preventing Falls: Care Instructions  Your Care Instructions    If you are an older adult who has diabetes, you may have a higher risk of falling. Complications of diabetes-such as nerve damage, foot problems, and reduced vision-may increase your risk of a fall. Some of your medicines also may add to your risk.  By making your home safer, you can lower your risk of falling. Doing things to prevent diabetes complications may also help to lower your risk.  You can make your home safer with a few simple measures.  Follow-up care is a key part of your treatment and safety. Be sure to make and go to all appointments, and call your doctor if you are having problems. It's also a good idea to know your test results and keep a list of the medicines you take.  How  can you care for yourself at home?  Taking care of yourself  ?? Keep your blood sugar at a target level (which you set with your doctor).  ?? Exercise regularly to improve your strength, muscle tone, and balance. Walk if you can. Swimming may be a good choice if you cannot walk easily.  ?? Have your vision checked as often as your doctor recommends. It is usually once a year or more often if you have eye problems.  ?? Know the side effects of the medicines you take. Ask your doctor or pharmacist whether the medicines you take can affect your balance. Sleeping pills or sedatives can affect your balance.  ?? Limit the amount of alcohol you drink. Alcohol can impair your balance and other senses.  ?? Have your doctor check your feet during each visit. If you have a foot problem, see your doctor.  Preventing falls at home  ?? Remove raised doorway thresholds, throw rugs, and clutter. Repair loose carpet or raised areas in the floor.  ?? Move furniture and electrical cords to keep them out of walking paths.  ?? Use nonskid floor wax, and wipe up spills right away, especially on  ceramic tile floors.  ?? If you use a walker or cane, put rubber tips on it. If you use crutches, clean the bottoms of them regularly with an abrasive pad, such as steel wool.  ?? Keep your house well lit, especially stairways, porches, and outside walkways. Use night-lights in areas such as hallways and bathrooms. Add extra light switches or use remote switches (such as switches that go on or off when you clap your hands) to make it easier to turn lights on if you have to get up during the night.  ?? Install sturdy handrails on stairways. Put grab bars near your shower, bathtub, and toilet.  ?? Store household items on low shelves so that you do not have to climb or reach high. Or use a reaching device that you can get at a medical supply store. If you have to climb for something, use a step stool with handrails, or ask someone to get it for you.  ?? Keep a cordless phone and a flashlight with new batteries by your bed. If possible, put a phone in each of the main rooms of your house, or carry a cell phone in case you fall and cannot reach a phone. Or you can wear a device around your neck or wrist. You push a button that sends a signal for help.  ?? Wear low-heeled shoes that fit well and give your feet good support. Use footwear with nonskid soles. Check the heels and soles of your shoes for wear. Repair or replace worn heels or soles.  ?? Do not wear socks without shoes on wood floors.  ?? Walk on the grass when the sidewalks are slippery. If you live in an area that gets snow and ice in the winter, sprinkle salt on slippery steps and sidewalks.  Where can you learn more?  Go to [employer]Stats.ca.  Enter [phone] in the search box to learn more about "Diabetes and Preventing Falls: Care Instructions."  Current as of: Jul 16, 2015  Content Version: 11.4  ?? 2006-2017 [employer]. Care instructions adapted under license by [employer] (which disclaims liability or warranty  for this information). If you have questions about a medical condition or this instruction, always ask your healthcare professional. Healthwise, Incorporated disclaims any warranty or liability for your use of  this information.       Diabetes Foot Health: Care Instructions  Your Care Instructions    When you have diabetes, your feet need extra care and attention. Diabetes can damage the nerve endings and blood vessels in your feet, making you less likely to notice when your feet are injured. Diabetes also limits your body's ability to fight infection and get blood to areas that need it. If you get a minor foot injury, it could become an ulcer or a serious infection. With good foot care, you can prevent most of these problems.  Caring for your feet can be quick and easy. Most of the care can be done when you are bathing or getting ready for bed.  Follow-up care is a key part of your treatment and safety. Be sure to make and go to all appointments, and call your doctor if you are having problems. It's also a good idea to know your test results and keep a list of the medicines you take.  How can you care for yourself at home?  ?? Keep your blood sugar close to normal by watching what and how much you eat, monitoring blood sugar, taking medicines if prescribed, and getting regular exercise.  ?? Do not smoke. Smoking affects blood flow and can make foot problems worse. If you need help quitting, talk to your doctor about stop-smoking programs and medicines. These can increase your chances of quitting for good.  ?? Eat a diet that is low in fats. High fat intake can cause fat to build up in your blood vessels and decrease blood flow.  ?? Inspect your feet daily for blisters, cuts, cracks, or sores. If you cannot see well, use a mirror or have someone help you.  ?? Take care of your feet:  ?? Wash your feet every day. Use warm (not hot) water. Check the water  temperature with your wrists or other part of your body, not your feet.  ?? Dry your feet well. Pat them dry. Do not rub the skin on your feet too hard. Dry well between your toes. If the skin on your feet stays moist, bacteria or a fungus can grow, which can lead to infection.  ?? Keep your skin soft. Use moisturizing skin cream to keep the skin on your feet soft and prevent calluses and cracks. But do not put the cream between your toes, and stop using any cream that causes a rash.  ?? Clean underneath your toenails carefully. Do not use a sharp object to clean underneath your toenails. Use the blunt end of a nail file or other rounded tool.  ?? Trim and file your toenails straight across to prevent ingrown toenails. Use a nail clipper, not scissors. Use an emery board to smooth the edges.  ?? Change socks daily. Socks without seams are best, because seams often rub the feet. You can find socks for people with diabetes from specialty catalogs.  ?? Look inside your shoes every day for things like gravel or torn linings, which could cause blisters or sores.  ?? Buy shoes that fit well:  ?? Look for shoes that have plenty of space around the toes. This helps prevent bunions and blisters.  ?? Try on shoes while wearing the kind of socks you will usually wear with the shoes.  ?? Avoid plastic shoes. They may rub your feet and cause blisters. Good shoes should be made of materials that are flexible and breathable, such as leather or cloth.  ??  Break in new shoes slowly by wearing them for no more than an hour a day for several days. Take extra time to check your feet for red areas, blisters, or other problems after you wear new shoes.  ?? Do not go barefoot. Do not wear sandals, and do not wear shoes with very thin soles. Thin soles are easy to puncture. They also do not protect your feet from hot pavement or cold weather.  ?? Have your doctor check your feet during each visit. If you have a foot  problem, see your doctor. Do not try to treat an early foot problem at home. Home remedies or treatments that you can buy without a prescription (such as corn removers) can be harmful.  ?? Always get early treatment for foot problems. A minor irritation can lead to a major problem if not properly cared for early.  When should you call for help?  Call your doctor now or seek immediate medical care if:  ? ?? You have a foot sore, an ulcer or break in the skin that is not healing after 4 days, bleeding corns or calluses, or an ingrown toenail.   ? ?? You have blue or black areas, which can mean bruising or blood flow problems.   ? ?? You have peeling skin or tiny blisters between your toes or cracking or oozing of the skin.   ? ?? You have a fever for more than 24 hours and a foot sore.   ? ?? You have new numbness or tingling in your feet that does not go away after you move your feet or change positions.   ? ?? You have unexplained or unusual swelling of the foot or ankle.   ?Watch closely for changes in your health, and be sure to contact your doctor if:  ? ?? You cannot do proper foot care.   Where can you learn more?  Go to [email or web address]://www.healthwise.net/GoodHelpConnections.  Enter A739 in the search box to learn more about "Diabetes Foot Health: Care Instructions."  Current as of: May 17, 2015  Content Version: 11.4  ?? 2006-2017 [employer], [employer]. Care instructions adapted under license by [employer] (which disclaims liability or warranty for this information). If you have questions about a medical condition or this instruction, always ask your healthcare professional. Healthwise, Incorporated disclaims any warranty or liability for your use of this information.    Nutrition Tips for Diabetes: After Your Visit  Your Care Instructions  A healthy diet is important to manage diabetes. It helps you lose weight (if you need to) and keep it off. It gives you the nutrition and energy  your body needs and helps prevent heart disease. But a diet for diabetes does not mean that you have to eat special foods. You can eat what your family eats, including occasional sweets and other favorites. But you do have to pay attention to how often you eat and how much you eat of certain foods. The right plan for you will give you meals that help you keep your blood sugar at healthy levels.  Try to eat a variety of foods and to spread carbohydrate throughout the day. Carbohydrate raises blood sugar higher and more quickly than any other nutrient does. Carbohydrate is found in sugar, breads and cereals, fruit, starchy vegetables such as potatoes and corn, and milk and yogurt.  You may want to work with a dietitian or diabetes educator to help you plan meals and snacks. A dietitian or diabetes  educator also can help you lose weight if that is one of your goals. The following tips can help you enjoy your meals and stay healthy.  Follow-up care is a key part of your treatment and safety. Be sure to make and go to all appointments, and call your doctor if you are having problems. It???s also a good idea to know your test results and keep a list of the medicines you take.  How can you care for yourself at home?  ?? Learn which foods have carbohydrate and how much carbohydrate to eat. A dietitian or diabetes educator can help you learn to keep track of how much carbohydrate you eat.  ?? Spread carbohydrate throughout the day. Eat some carbohydrate at all meals, but do not eat too much at any one time.  ?? Plan meals to include food from all the food groups. These are the food groups and some example portion sizes:  ?? Grains: 1 slice of bread (1 ounce), ?? cup of cooked cereal, and 1/3 cup of cooked pasta or rice. These have about 15 grams of carbohydrate in a serving. Choose whole grains such as whole wheat bread or crackers, oatmeal, and brown rice more often than refined grains.   ?? Fruit: 1 small fresh fruit, such as an apple or orange; ?? of a banana; ?? cup of chopped, cooked, or canned fruit; ?? cup of fruit juice; 1 cup of melon or raspberries; and 2 tablespoons of dried fruit. These have about 15 grams of carbohydrate in a serving.  ?? Dairy: 1 cup of nonfat or low-fat milk and 2/3 cup of plain yogurt. These have about 15 grams of carbohydrate in a serving.  ?? Protein foods: Beef, chicken, [place]key, fish, eggs, tofu, cheese, cottage cheese, and peanut butter. A serving size of meat is 3 ounces, which is about the size of a deck of cards. Examples of meat substitute serving sizes (equal to 1 ounce of meat) are 1/4 cup of cottage cheese, 1 egg, 1 tablespoon of peanut butter, and ?? cup of tofu. These have very little or no carbohydrate per serving.  ?? Vegetables: Starchy vegetables such as ?? cup of cooked dried beans, peas, potatoes, or corn have about 15 grams of carbohydrate. Nonstarchy vegetables have very little carbohydrate, such as 1 cup of raw leafy vegetables (such as spinach), ?? cup of other vegetables (cooked or chopped), and 3/4 cup of vegetable juice.  ?? Use the plate format to plan meals. It is a good, quick way to make sure that you have a balanced meal. It also helps you spread carbohydrate throughout the day. You divide your plate by types of foods. Put vegetables on half the plate, meat or meat substitutes on one-quarter of the plate, and a grain or starchy vegetable (such as brown rice or a potato) in the final quarter of the plate. To this you can add a small piece of fruit and 1 cup of milk or yogurt, depending on how much carbohydrate you are supposed to eat at a meal.  ?? Talk to your dietitian or diabetes educator about ways to add limited amounts of sweets into your meal plan. You can eat these foods now and then, as long as you include the amount of carbohydrate they have in your daily carbohydrate allowance.   ?? If you drink alcohol, limit it to no more than 1 drink a day for women and 2 drinks a day for men. If you are pregnant, no amount  of alcohol is known to be safe.  ?? Protein, fat, and fiber do not raise blood sugar as much as carbohydrate does. If you eat a lot of these nutrients in a meal, your blood sugar will rise more slowly than it would otherwise.  ?? Limit saturated fats, such as those from meat and dairy products. Try to replace it with monounsaturated fat, such as olive oil. This is a healthier choice because people who have diabetes are at higher-than-average risk of heart disease. But use a modest amount of olive oil. A tablespoon of olive oil has 14 grams of fat and 120 calories.  ?? Exercise lowers blood sugar. If you take insulin by shots or pump, you can use less than you would if you were not exercising. Keep in mind that timing matters. If you exercise within 1 hour after a meal, your body may need less insulin for that meal than it would if you exercised 3 hours after the meal. Test your blood sugar to find out how exercise affects your need for insulin.  ?? Exercise on most days of the week. Aim for at least 30 minutes. Exercise helps you stay at a healthy weight and helps your body use insulin. Walking is an easy way to get exercise. Gradually increase the amount you walk every day. You also may want to swim, bike, or do other activities.  When you eat out  ?? Learn to estimate the serving sizes of foods that have carbohydrate. If you measure food at home, it will be easier to estimate the amount in a serving of restaurant food.  ?? If the meal you order has too much carbohydrate (such as potatoes, corn, or baked beans), ask to have a low-carbohydrate food instead. Ask for a salad or green vegetables.  ?? If you use insulin, check your blood sugar before and after eating out to help you plan how much to eat in the future.  ?? If you eat more carbohydrate at a meal than you had planned, take a walk  or do other exercise. This will help lower your blood sugar.   Where can you learn more?   Go to [employer]  Enter [phone] in the search box to learn more about "Nutrition Tips for Diabetes: After Your Visit."   ?? 2006-2014 [employer]wise, Inc[employer]. Care instructions adapted under license by [employer] (which disclaims liability or warranty for this information). This care instruction is for use with your licensed healthcare professional. If you have questions about a medical condition or this instruction, always ask your healthcare professional. Healthwise, Incorporated disclaims any warranty or liability for your use of this information.  Content Version: 10.2.346038; Current as of: August 07, 2012                 Proteinuria: Care Instructions  Your Care Instructions    Proteinuria means that you have too much protein in your urine. This is usually caused by a kidney problem.  Your body's blood passes through your kidneys. Normally, the kidneys remove waste from the blood. The waste then leaves the body in the urine. But they don't let protein leave the body. If your kidneys are not working well, they let too much protein get in your urine. A high level of protein in your urine is a sign that something is harming your kidneys.  It may be puzzling to find out that you have a problem with your kidneys, because you probably don't feel different.  Your  doctor may do more tests to find out what is causing the protein to get into your urine. Possible causes include an infection or a medical condition, such as diabetes or heart disease. You may need regular urine tests in the future. You may be able to reduce the protein in your urine by getting exercise, eating a healthy diet, and taking medicine.  Follow-up care is a key part of your treatment and safety. Be sure to make and go to all appointments, and call your doctor if you are having  problems. It's also a good idea to know your test results and keep a list of the medicines you take.  How can you care for yourself at home?  ?? Take your medicines exactly as prescribed. Call your doctor if you think you are having a problem with your medicine. You will get more details on the specific medicines your doctor prescribes.  ?? Work with your doctor and dietitian to set up a diet that will be healthy for you. You may need to:  ?? Eat a heart-healthy diet to keep the fat (cholesterol) in your blood under control.  ?? Eat a low-salt diet to keep your blood pressure at normal levels.  ?? Limit protein in your foods.  ?? Limit the amount of fluids you drink.  ?? If you have diabetes, try to keep your blood sugar at normal or near-normal levels.  ?? Follow your diet. Eat a variety of foods. Spread carbohydrate throughout your meals.  ?? If your doctor recommends it, get more exercise. Walking is a good choice. Bit by bit, increase the amount you walk every day. Try for at least 30 minutes on most days of the week.  ?? Check your blood sugar as often as your doctor recommends.  ?? Do not smoke. Smoking raises your risk of many health problems, including kidney damage. If you need help quitting, talk to your doctor about stop-smoking programs and medicines. These can increase your chances of quitting for good.  ?? Do not take ibuprofen, naproxen, acetaminophen (Tylenol), or similar medicines, unless your doctor tells you to. These medicines may make kidney problems worse.  ?? Check with your doctor before you take any herbal supplements or over-the-counter medicines.  When should you call for help?  Watch closely for changes in your health, and be sure to contact your doctor if:  ? ?? You do not get better as expected.   Where can you learn more?  Go to [employer].  Enter [phone] in the search box to learn more about "Proteinuria: Care Instructions."  Current as of: Jul 16, 2015   Content Version: 11.4  ?? 2006-2017 [employer]. Care instructions adapted under license by [employer] (which disclaims liability or warranty for this information). If you have questions about a medical condition or this instruction, always ask your healthcare professional. [employer] disclaims any warranty or liability for your use of this information.       Kidney Disease and High Blood Pressure: Care Instructions  Your Care Instructions    Long-term (chronic) kidney disease happens when the kidneys cannot remove waste and keep your body's fluids and chemicals in balance. Usually, the kidneys remove waste from the blood through the urine. When the kidneys are not working well, waste can build up so much that it poisons the body. Kidney disease can make you very tired. It also can cause swelling, or edema, in your legs or other areas of your body.  High blood pressure is one of the major causes of chronic kidney disease. And kidney disease can also cause high blood pressure. No matter which came first, having high blood pressure damages the tiny blood vessels in the kidneys.  If you have high blood pressure, it is important to lower it. There are many things you can do to lower your blood pressure, which may help slow or stop the damage to your kidneys.  Follow-up care is a key part of your treatment and safety. Be sure to make and go to all appointments, and call your doctor if you are having problems. It's also a good idea to know your test results and keep a list of the medicines you take.  How can you care for yourself at home?  ?? Be safe with medicines. Take your medicines exactly as prescribed. Call your doctor if you have any problems with your medicine. You will probably need more than one medicine to lower your blood pressure. You will get more details on the specific medicines your doctor prescribes.   ?? Work with your doctor and a dietitian to plan meals that have the right amount of nutrients for you. You will probably have to limit salt, fluids, and protein.  ?? Stay at a healthy weight. This is very important if you put on weight around the waist. Losing even 10 pounds can help you lower your blood pressure.  ?? Manage other health problems such as diabetes and high cholesterol. You can help lower your risk for heart disease and blood vessel problems with a healthy lifestyle along with medicines.  ?? Do not take ibuprofen (Advil, Motrin) or naproxen (Aleve), or similar medicines, unless your doctor tells you to. They may make chronic kidney disease worse. It is okay to take acetaminophen (Tylenol).  ?? If your doctor recommends it, get more exercise. Walking is a good choice. Bit by bit, increase the amount you walk every day. Try for at least 30 minutes on most days of the week. You also may want to swim, bike, or do other activities.  ?? Limit or avoid alcohol. Talk to your doctor about whether you can drink any alcohol.  ?? Do not smoke or allow others to smoke around you. If you need help quitting, talk to your doctor about stop-smoking programs and medicines. These can increase your chances of quitting for good.  When should you call for help?  Call 911 anytime you think you may need emergency care. For example, call if:  ? ?? You passed out (lost consciousness).   ?Call your doctor now or seek immediate medical care if:  ? ?? You have new or worse nausea and vomiting.   ? ?? You have much less urine than normal, or you have no urine.   ? ?? You are feeling confused or cannot think clearly.   ? ?? You have new or more blood in your urine.   ? ?? You have new swelling.   ? ?? You are dizzy or lightheaded, or you feel like you may faint.   ?Watch closely for changes in your health, and be sure to contact your doctor if:  ? ?? You do not get better as expected.   Where can you learn more?   Go to [employer].  Enter [id number] in the search box to learn more about "Kidney Disease and High Blood Pressure: Care Instructions."  Current as of: Jul 16, 2015  Content Version: 11.4  ?? 2006-2017 [employer]. Care instructions adapted under license by [employer] (which disclaims liability or warranty for this information). If you have questions about a medical condition or this instruction, always ask your healthcare professional. [employer] disclaims any warranty or liability for your use of this information.       Low Sodium Diet (2,000 Milligram): Care Instructions  Your Care Instructions    Too much sodium causes your body to hold on to extra water. This can raise your blood pressure and force your heart and kidneys to work harder. In very serious cases, this could cause you to be put in the hospital. It might even be life-threatening. By limiting sodium, you will feel better and lower your risk of serious problems.  The most common source of sodium is salt. People get most of the salt in their diet from canned, prepared, and packaged foods. Fast food and restaurant meals also are very high in sodium. Your doctor will probably limit your sodium to less than 2,000 milligrams (mg) a day. This limit counts all the sodium in prepared and packaged foods and any salt you add to your food.  Follow-up care is a key part of your treatment and safety. Be sure to make and go to all appointments, and call your doctor if you are having problems. It's also a good idea to know your test results and keep a list of the medicines you take.  How can you care for yourself at home?  Read food labels  ?? Read labels on cans and food packages. The labels tell you how much sodium is in each serving. Make sure that you look at the serving size. If you eat more than the serving size, you have eaten more sodium.   ?? Food labels also tell you the Percent Daily Value for sodium. Choose products with low Percent Daily Values for sodium.  ?? Be aware that sodium can come in forms other than salt, including monosodium glutamate (MSG), sodium citrate, and sodium bicarbonate (baking soda). MSG is often added to Asian food. When you eat out, you can sometimes ask for food without MSG or added salt.  Buy low-sodium foods  ?? Buy foods that are labeled "unsalted" (no salt added), "sodium-free" (less than 5 mg of sodium per serving), or "low-sodium" (less than 140 mg of sodium per serving). Foods labeled "reduced-sodium" and "light sodium" may still have too much sodium. Be sure to read the label to see how much sodium you are getting.  ?? Buy fresh vegetables, or frozen vegetables without added sauces. Buy low-sodium versions of canned vegetables, soups, and other canned goods.  Prepare low-sodium meals  ?? Cut back on the amount of salt you use in cooking. This will help you adjust to the taste. Do not add salt after cooking. One teaspoon of salt has about 2,300 mg of sodium.  ?? Take the salt shaker off the table.  ?? Flavor your food with garlic, lemon juice, onion, vinegar, herbs, and spices. Do not use soy sauce, lite soy sauce, steak sauce, onion salt, garlic salt, celery salt, mustard, or ketchup on your food.  ?? Use low-sodium salad dressings, sauces, and ketchup. Or make your own salad dressings and sauces without adding salt.  ?? Use less salt (or none) when recipes call for it. You can often use half the salt a recipe calls for without losing flavor. Other foods such as rice, pasta, and grains do not need added salt.  ?? Rinse canned  vegetables, and cook them in fresh water. This removes some-but not all-of the salt.  ?? Avoid water that is naturally high in sodium or that has been treated with water softeners, which add sodium. Call your local water company to find out the sodium content of your water supply. If you buy bottled  water, read the label and choose a sodium-free brand.  Avoid high-sodium foods  ?? Avoid eating:  ?? Smoked, cured, salted, and canned meat, fish, and poultry.  ?? Ham, bacon, hot dogs, and luncheon meats.  ?? Regular, hard, and processed cheese and regular peanut butter.  ?? Crackers with salted tops, and other salted snack foods such as pretzels, chips, and salted popcorn.  ?? Frozen prepared meals, unless labeled low-sodium.  ?? Canned and dried soups, broths, and bouillon, unless labeled sodium-free or low-sodium.  ?? Canned vegetables, unless labeled sodium-free or low-sodium.  ?? [place] fries, pizza, tacos, and other fast foods.  ?? Pickles, olives, ketchup, and other condiments, especially soy sauce, unless labeled sodium-free or low-sodium.  Where can you learn more?  Go to [employer].  Enter [id number] in the search box to learn more about "Low Sodium Diet (2,000 Milligram): Care Instructions."  Current as of: Jul 16, 2015  Content Version: 11.4  ?? 2006-2017 [employer]. Care instructions adapted under license by [employer] (which disclaims liability or warranty for this information). If you have questions about a medical condition or this instruction, always ask your healthcare professional. [employer] disclaims any warranty or liability for your use of this information.       Diet for Chronic Kidney Disease (Before Dialysis): Care Instructions  Your Care Instructions  When you have chronic kidney disease, you need to change your diet to avoid foods that make your kidneys worse. You may need to limit salt, fluids, and protein. You also may need to limit minerals such as potassium and phosphorus. A diet for chronic kidney disease takes planning. A dietitian who specializes in kidney disease can help you plan meals that meet your needs.  These guidelines are for people who are not on dialysis. Talk with your  doctor or dietitian to make sure your diet is right for your condition. Do not change your diet without talking to your doctor or dietitian.  Follow-up care is a key part of your treatment and safety. Be sure to make and go to all appointments, and call your doctor if you are having problems. It's also a good idea to know your test results and keep a list of the medicines you take.  How can you care for yourself at home?  ?? Work with your doctor or dietitian to create a food plan.  ?? Do not skip meals or go for many hours without eating. If you do not feel very hungry, try to eat 4 or 5 small meals instead of 1 or 2 big meals.  ?? If you have a hard time eating enough, talk to your doctor or dietitian about ways you can add calories to your diet.  ?? Do not take any vitamins or minerals, supplements, or herbal products without talking to your doctor first.  ?? Check with your doctor about whether it is safe for you to drink alcohol.  To get the right amount of protein  ?? Ask your doctor or dietitian how much protein you can have each day. Most people with chronic kidney disease need to limit the amount of protein they eat. But you still  need some protein to stay healthy.  ?? Include all sources of protein in your daily protein count. Besides meat, poultry, fish, and eggs, protein is found in milk and milk products, beans and nuts, breads, cereals, and vegetables.  To limit salt  ?? Read food labels on cans and food packages. The labels tell you how much sodium is in each serving. Make sure that you look at the serving size. If you eat more than the serving size, you will get more sodium than what is listed on the label.  ?? Do not add salt to your food. Avoid foods that list salt, sodium, or monosodium glutamate (MSG) as an ingredient.  ?? Buy foods that are labeled "no salt added," "sodium-free," or "low-sodium." Foods labeled "reduced-sodium" and "light sodium" may still have too much sodium.   ?? Limit processed foods, fast food, and restaurant foods. These types of food are very high in sodium.  ?? Avoid salted pretzels, chips, popcorn, and other salted snacks.  ?? Avoid smoked, cured, salted, and canned meat, fish, and poultry. This includes ham, bacon, hot dogs, and luncheon meats.  ?? You may use lemon, herbs, and spices to flavor your meals.  To limit potassium  ?? Choose low-potassium fruits such as applesauce, pineapple, grapes, blueberries, and raspberries.  ?? Choose low-potassium vegetables such as lettuce, green beans, cucumber, and radishes.  ?? Choose low-potassium foods such as hummus, spaghetti, macaroni, rice, tortillas, and bagels.  ?? Limit or avoid high-potassium foods such as milk, bananas, oranges, cantaloupe, potatoes, spinach, tomatoes, broccoli, and sweet potatoes.  ?? Do not use a salt substitute or lite salt unless your doctor says it is okay. Most salt substitutes and lite salts are high in potassium.  To limit phosphorus  ?? Follow your food plan to know how much milk and milk products you can have.  ?? Limit nuts, peanut butter, seeds, lentils, beans, organ meats, and sardines.  ?? Avoid cola drinks.  ?? Avoid bran breads or bran cereals.  If you need to limit fluids  ?? Know how much fluid you can drink. Every day fill a pitcher with that amount of water. If you drink another fluid (such as coffee) that day, pour an equal amount of water out of the pitcher.  ?? Count foods that are liquid at room temperature, such as gelatin dessert and ice cream, as fluids.  Where can you learn more?  Go to [employer].  Enter [phone] in the search box to learn more about "Diet for Chronic Kidney Disease (Before Dialysis): Care Instructions."  Current as of: Jul 16, 2015  Content Version: 11.4  ?? 2006-2017 [employer]. Care instructions adapted under license by [employer] (which disclaims liability or warranty  for this information). If you have questions about a medical condition or this instruction, always ask your healthcare professional. [employer] disclaims any warranty or liability for your use of this information.

## 2016-03-03 LAB — LIPID PANEL
Cholesterol, total: 132 mg/dL (ref 100–199)
HDL Cholesterol: 42 mg/dL (ref >39)
LDL, calculated: 71 mg/dL (ref 0–99)
Triglyceride: 93 mg/dL (ref 0–149)
VLDL, calculated: 19 mg/dL (ref 5–40)

## 2016-03-03 LAB — CBC W/O DIFF
HCT: 39.8 % (ref 34.0–46.6)
HGB: 12.7 g/dL (ref 11.1–15.9)
MCH: 28.9 pg (ref 26.6–33.0)
MCHC: 31.9 g/dL (ref 31.5–35.7)
MCV: 91 fL (ref 79–97)
PLATELET: 173 10*3/uL (ref 150–379)
RBC: 4.39 x10E6/uL (ref 3.77–5.28)
RDW: 14.2 % (ref 12.3–15.4)
WBC: 6.5 10*3/uL (ref 3.4–10.8)

## 2016-03-03 LAB — METABOLIC PANEL, COMPREHENSIVE
A-G Ratio: 1 — ABNORMAL LOW (ref 1.2–2.2)
ALT (SGPT): 9 IU/L (ref 0–32)
AST (SGOT): 13 IU/L (ref 0–40)
Albumin: 3.4 g/dL — ABNORMAL LOW (ref 3.5–4.8)
Alk. phosphatase: 58 IU/L (ref 39–117)
BUN/Creatinine ratio: 30 — ABNORMAL HIGH (ref 12–28)
BUN: 26 mg/dL (ref 8–27)
Bilirubin, total: 0.5 mg/dL (ref 0.0–1.2)
CO2: 20 mmol/L (ref 18–29)
Calcium: 9.2 mg/dL (ref 8.7–10.3)
Chloride: 100 mmol/L (ref 96–106)
Creatinine: 0.87 mg/dL (ref 0.57–1.00)
GFR est AA: 76 mL/min/{1.73_m2} (ref >59)
GFR est non-AA: 66 mL/min/{1.73_m2} (ref >59)
GLOBULIN, TOTAL: 3.3 g/dL (ref 1.5–4.5)
Glucose: 235 mg/dL — ABNORMAL HIGH (ref 65–99)
Potassium: 4.7 mmol/L (ref 3.5–5.2)
Protein, total: 6.7 g/dL (ref 6.0–8.5)
Sodium: 140 mmol/L (ref 134–144)

## 2016-03-05 LAB — CULTURE, MISC. AEROBIC

## 2016-03-16 ENCOUNTER — Encounter: Attending: Family Medicine | Primary: Geriatric Medicine

## 2016-03-31 ENCOUNTER — Other Ambulatory Visit: Payer: Self-pay | Admitting: Internal Medicine

## 2016-03-31 DIAGNOSIS — E041 Nontoxic single thyroid nodule: Secondary | ICD-10-CM

## 2016-04-04 ENCOUNTER — Ambulatory Visit
Admission: RE | Admit: 2016-04-04 | Discharge: 2016-04-04 | Disposition: A | Discharge status: Home / Self Care | Payer: Medicare PPO | Source: Ambulatory Visit | Attending: Internal Medicine | Admitting: Internal Medicine

## 2016-04-04 DIAGNOSIS — E041 Nontoxic single thyroid nodule: Secondary | ICD-10-CM

## 2016-04-19 ENCOUNTER — Inpatient Hospital Stay: Admit: 2016-06-13 | Payer: MEDICARE | Primary: Geriatric Medicine

## 2016-04-19 ENCOUNTER — Ambulatory Visit: Admit: 2016-04-19 | Discharge: 2016-04-19 | Payer: MEDICARE | Attending: Family Medicine | Primary: Geriatric Medicine

## 2016-04-19 DIAGNOSIS — E119 Type 2 diabetes mellitus without complications: Secondary | ICD-10-CM

## 2016-04-19 MED ORDER — GUAIFENESIN 600 MG TABLET,EXTENDED RELEASE BIPHASIC 12 HR
600 mg | ORAL_TABLET | Freq: Two times a day (BID) | ORAL | 1 refills | Status: DC | PRN
Start: 2016-04-19 — End: 2016-08-10

## 2016-04-19 MED ORDER — FLUTICASONE 100 MCG-UMECLID 62.5 MCG-VILANT 25 MCG POWD FOR INHALATION
Freq: Every day | RESPIRATORY_TRACT | 6 refills | Status: DC
Start: 2016-04-19 — End: 2016-08-14

## 2016-04-19 MED ORDER — PNEUMOCOCCAL 13-VAL CONJ VACCINE-DIP CRM (PF) 0.5 ML IM SYRINGE
0.5 mL | Freq: Once | INTRAMUSCULAR | 0 refills | Status: AC
Start: 2016-04-19 — End: 2016-04-19

## 2016-04-19 NOTE — Progress Notes (Signed)
HISTORY OF PRESENT ILLNESS  Allison Newman is a 76 y.o. female.  HPI   DMtype II    Compliant w/ meds, having no diabetic diet, and not doing much of daily exercise, obtains home glucose monitoring averaging  140's  . No Rf needed for today. Denies any tingling sensation, polyurea and polydipsia, last a1c was not at target 7.7% but mostly wt dependent , Last podiatry visit 2017  And last eye exam was 2017  Last urine microalbumin 2017 and was normal  . Feeling better since the last visit.  Pt CKF was corrected with change of meds before her last visit GFR back into nl level pt has oV with nephrologist was told that she may not need to see the specialist at this time, \\other wise, was recommended to avoid nephrotoxic pain meds and 4-6 glasses per day    HTN  Today pt present for Bp check and the patient stating that so far there has been a Compliancy w/ the bp meds, she is having had the low salt diet   the patient has been active life style and does do the daily walking,   today the pt denies Chest Pain, has no legs swelling no lightheadedness,the pat has not been feeling anxious, and  Has not been feeling stressed out, otherwise feeling better since the last visit      Current Outpatient Prescriptions   Medication Sig Dispense Refill   ??? rosuvastatin (CRESTOR) 20 mg tablet Take 1 Tab by mouth nightly. 30 Tab 6   ??? SITagliptin-metFORMIN (JANUMET) 50-500 mg per tablet Take 1 Tab by mouth daily. 30 Tab 1   ??? triamterene-hydroCHLOROthiazide (DYAZIDE) 37.5-25 mg per capsule take 1 tablet once daily 30 Cap 3   ??? CALCIUM 600 + D tablet take 1 tablet by mouth twice a day 60 Tab 11   ??? COL-RITE 100 mg capsule take 1 capsule by mouth three times a day if needed for constipation 90 Cap 3   ??? metoprolol tartrate (LOPRESSOR) 50 mg tablet take 1 tablet by mouth twice a day 60 Tab 7   ??? aspirin delayed-release 81 mg tablet Take 1 Tab by mouth daily. 30 Tab 11    ??? oxyCODONE-acetaminophen (PERCOCET 10) 10-325 mg per tablet take 1/2 TO 1 tablet once daily if needed for pain  0   ??? lisinopril (PRINIVIL, ZESTRIL) 10 mg tablet take 1 tablet by mouth once daily 30 Tab 6   ??? FREESTYLE LANCETS 28 gauge misc   1   ??? glucose blood VI test strips (FREESTYLE LITE STRIPS) strip Test twice daily. 1 Package 11   ??? Lancets (FREESTYLE LANCETS) misc Test blood sugar 4 times a day 1 Package 11   ??? cyanocobalamin 1,000 mcg tablet Take 1,000 mcg by mouth daily.     ??? cyclobenzaprine (FLEXERIL) 10 mg tablet daily as needed.  0   ??? COMBIVENT RESPIMAT 20-100 mcg/actuation inhaler 1 Puff every six (6) hours as needed.  0   ??? oxyCODONE IR (OXY-IR) 15 mg immediate release tablet TAKE 1 TABLET TWICE A DAY FOR CHRONIC PAIN  0   ??? oxyCODONE IR (ROXICODONE) 10 mg tab immediate release tablet take 1 tablet by mouth UP TO three times a day  0   ??? methimazole (TAPAZOLE) 5 mg tablet Take 5 mg by mouth daily.       No Known Allergies  Past Medical History:   Diagnosis Date   ??? Abdominal pain 05/08/2013   ??? Abnormal  finding on EKG 11/17/2013   ??? ACP (advance care planning) 05/03/2015   ??? Arthritis    ??? 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 Chronic pain of left knee 08/13/2014   ??? Chronic right hip pain 03/17/2013   ??? 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 Diabetes (HCC)    ??? Elevated BUN 09/13/2011   ??? Finger lesion 03/02/2016   ??? Gallbladder polyp 04/08/2013   ??? Gastrointestinal disorder     GERD   ??? GERD (gastroesophageal reflux disease)    ??? Hypercholesterolemia    ??? Hypertension    ??? 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 Microalbuminuria 08/29/2013   ??? 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 On aspirin at home 05/03/2015   ??? Other unknown and unspecified cause of morbidity or mortality     hx of bronchitis    ??? Rash, skin 09/25/2012   ??? 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 Slow transit constipation 08/13/2014   ??? Umbilical hernia 05/08/2013     Past Surgical History:   Procedure Laterality Date   ??? 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 HX ORTHOPAEDIC  2000    screw in foot left   ??? HX OTHER SURGICAL      colonoscopy     Family History   Problem Relation Age of Onset   ??? Diabetes Mother    ??? Heart Disease Mother      rheumatic fever   ??? Breast Cancer Mother    ??? Cancer Father      lung   ??? Cancer Sister      colon & breast   ??? Breast Cancer Sister    ??? Cancer Brother      throat     Social History   Substance Use Topics   ??? Smoking status: Former Smoker     Packs/day: 0.25     Years: 1.00     Types: Cigarettes     Quit date: 06/03/1991   ??? Smokeless tobacco: Never Used   ??? Alcohol use No      Lab Results  Component Value Date/Time   WBC 6.5 03/02/2016 01:12 PM   HGB 12.7 03/02/2016 01:12 PM   HCT 39.8 03/02/2016 01:12 PM   PLATELET 173 03/02/2016 01:12 PM   MCV 91 03/02/2016 01:12 PM     Lab Results  Component Value Date/Time   Hemoglobin A1c 6.3 (H) 08/25/2015 03:55 PM   Hemoglobin A1c 6.8 (H) 03/24/2015 09:28 AM   Hemoglobin A1c 6.5 (H) 01/22/2014 08:40 AM   Hemoglobin A1c, External 6.6 07/29/2014   Glucose 235 (H) 03/02/2016 01:12 PM   Glucose (POC) 143 (H) 01/03/2008 09:21 AM   Microalb/Creat ratio (ug/mg creat.) 6.5 03/24/2015 11:00 AM   LDL, calculated 71 03/02/2016 01:12 PM   Creatinine (POC) 0.8 04/11/2013 11:28 AM   Creatinine 0.87 03/02/2016 01:12 PM      Lab Results  Component Value Date/Time   TSH 1.390 03/24/2015 09:28 AM   T4, Total 8.9 06/27/2012 09:33 AM         Review of Systems   Constitutional: Negative for chills, fever and malaise/fatigue.   HENT: Negative for congestion and nosebleeds.    Eyes: Negative for blurred vision  and pain.   Respiratory: Negative for shortness of breath and wheezing.    Cardiovascular: Negative for chest pain, palpitations and leg swelling.    Gastrointestinal: Negative for constipation, diarrhea, nausea and vomiting.   Genitourinary: Negative for dysuria and frequency.   Musculoskeletal: Negative for joint pain and myalgias.   Skin: Negative for itching and rash.   Neurological: Negative for dizziness, loss of consciousness and headaches.   Endo/Heme/Allergies: Does not bruise/bleed easily.   Psychiatric/Behavioral: Negative for depression. The patient is not nervous/anxious and does not have insomnia.    All other systems reviewed and are negative.      Physical Exam   Constitutional: She is oriented to person, place, and time. She appears well-developed and well-nourished.   HENT:   Head: Normocephalic and atraumatic.   Eyes: Conjunctivae and EOM are normal.   Neck: Normal range of motion. Neck supple. No JVD present.   Cardiovascular: Normal rate, regular rhythm and normal heart sounds.    No murmur heard.  Pulmonary/Chest: Effort normal and breath sounds normal. No stridor.   Abdominal: Soft. Bowel sounds are normal. She exhibits no distension and no mass. There is no tenderness.   Musculoskeletal: Normal range of motion. She exhibits no edema.   Nl pulses, nl visual inspection nl monoF, +dystrophy and elongated Nails   Lymphadenopathy:     She has no cervical adenopathy.   Neurological: She is alert and oriented to person, place, and time.   Skin: No erythema.   Psychiatric: Her behavior is normal.   Nursing note and vitals reviewed.      ASSESSMENT and PLAN  Diagnoses and all orders for this visit:    1. Diabetes mellitus without complication (HCC)  -     MICROALBUMIN, UR, RAND W/ MICROALBUMIN/CREA RATIO  -     TETANUS, DIPHTHERIA TOXOIDS AND ACELLULAR PERTUSSIS VACCINE (TDAP), IN INDIVIDS. >=7, IM    2. COPD (chronic obstructive pulmonary disease) with chronic bronchitis (HCC)  -     TETANUS, DIPHTHERIA TOXOIDS AND ACELLULAR PERTUSSIS VACCINE (TDAP), IN INDIVIDS. >=7, IM    Other orders   -     fluticasone-umeclidin-vilanter (TRELEGY ELLIPTA) 100-62.5-25 mcg dsdv; Take 1 Puff by inhalation daily.  -     guaiFENesin ER (MUCINEX) 600 mg ER tablet; Take 1 Tab by mouth two (2) times daily as needed for Congestion. Indications: Cough  -     pneumococcal 13 val conj dip (PREVNAR-13) 0.5 mL syrg injection; 0.5 mL by IntraMUSCular route once for 1 dose. Indications: PREVENTION OF STREPTOCOCCUS PNEUMONIAE INFECTION      At this time patient was told to lose weight, so that the current body mass index would get into a close to 25 or between 20-25, patient was told that the patient can achieve this by starting an active life style modifications, working on the diet, increase physical activity, limit alcohol consumption, stop secondhand tobacco exposure,    for which includes creating a an interesting delightful to do list,  such as start of a light physical activity with a brisk daily walking 30 minutes most days of the week, most likely to total of 150 minutes per week, then the patient was told to try to avoid fatty fast foods, have a low-fat low-cholesterol diet, include seafood such as adding fatty fish such as Allison Newman, Mackerel, Salmon to the diet, increase vegetables and fruits, nuts 3-4 times per week and finally have a low-salt and K rich food intake for a good 4-6 months  possibly for ever for the best outcome, patient was told that either a DASH diet or Mediterranean diet but satisfies the need     Routine labs ordered, and the needed abnormal labs will be discussed soon and they can be repeated in 3-6 months.    In addition relevant handouts were given to the patient for a better understanding,    patient was told to call if any problems.  Patient acknowledged understanding and  agreed with today's recommendations.

## 2016-04-19 NOTE — Patient Instructions (Signed)
Lifestyle Changes for Chronic Health Conditions: Care Instructions  Your Care Instructions    If you have diabetes, heart disease, or blood pressure or cholesterol problems, making healthy lifestyle changes can help. Changing your diet, getting more exercise, and getting rid of harmful habits can reduce your risk of heart attack, stroke, and other serious health problems. Even small changes can help. Start with steps that you can take right away. Think about things such as time limits, stress, and temptations that might get in the way, and figure out how you can avoid or overcome them.  Work with your doctor to plan lifestyle changes to deal with your health problem.  Follow-up care is a key part of your treatment and safety. Be sure to make and go to all appointments, and call your doctor if you are having problems. It's also a good idea to know your test results and keep a list of the medicines you take.  How can you care for yourself at home?  ?? If your doctor recommends it, get more exercise. Walking is a good choice. Bit by bit, increase the amount you walk every day. Try for at least 30 minutes on most days of the week. You also may want to swim, bike, or do other activities.  ?? Eat a healthy diet.  ?? Choose fruits, vegetables, whole grains, protein, and low-fat dairy foods.  ?? Limit saturated fat and avoid trans fat.  ?? Try to limit how much sodium you eat to less than 2,300 milligrams (mg) a day.  ?? Lose weight if you are overweight. A loss of just 10 pounds can help. The best way to lose weight and keep it off is to exercise on most days, choose healthy foods, and keep portion sizes under control. Aim to lose no more than 1 pound a week.  ?? Do not smoke. Smoking can make most chronic health problems worse. If you need help quitting, talk to your doctor about stop-smoking programs and medicines. These can increase your chances of quitting for good.   ?? Limit alcohol to 2 drinks a day for men and 1 drink a day for women. Too much alcohol can cause health problems.  ?? Take your medicines on time and in the right amounts. Use a pillbox to organize them, and use schedules, alarms, or other tools to help you stay on track. For medicines to work properly, you must take them as directed. Call your doctor if you think you are having a problem with your medicine.  ?? Get your blood pressure checked often. Get a cholesterol test when your doctor tells you to. And keep track of your blood sugar if you have diabetes.  ?? If you have talked about it with your doctor, take a low-dose aspirin every day. Aspirin can help certain people lower their risk of a heart attack or stroke. But taking aspirin isn't right for everyone, because it can cause serious bleeding. Do not start taking daily aspirin unless your doctor knows about it.  Where can you learn more?  Go to [employer].  Enter [phone] in the search box to learn more about "Lifestyle Changes for Chronic Health Conditions: Care Instructions."  Current as of: Jul 16, 2015  Content Version: 11.4  ?? 2006-2017 [employer]. Care instructions adapted under license by [employer] (which disclaims liability or warranty for this information). If you have questions about a medical condition or this instruction, always ask your healthcare professional. [place] any  warranty or liability for your use of this information.       Learning About Changing a Habit by Setting Goals  How can you change a habit?    If you've decided to change a habit-whether it's quitting smoking, lowering your blood pressure, becoming more active, or doing something else to improve your health-congratulations! Making that decision is the first step toward making a change.  What happens next? Have a reason. Set goals you can reach. Prepare for slip-ups. And get support.   What's your reason?  Your reason for wanting to change a habit is really important. Maybe you want to quit smoking so that you can avoid future health problems. Or maybe you want to eat a healthier diet so you can lose weight. If you have high blood pressure, your reason may be clear: to lower your blood pressure. Maybe you smoke and want to save money on cigarettes.  You need to feel ready to make a change. If you don't feel ready now, that's okay. You can still be thinking and planning. When you truly want to make changes, you're ready for the next step.  It's not easy to change habits-but you can do it. Taking the time to really think about what will motivate or inspire you will help you reach your goals.  How do you set goals?  ?? Focus on small goals. This will help you reach larger goals over time. With smaller goals, you'll have success more often, which will help you stay with it. For example, your large goal may be to lose 50 pounds. Your small goal could be to lose 5.  ?? Write down your goals. This will help you remember, and you'll have a clearer idea of what you want to achieve. Use a journal or notebook to record your goals. Hang up your plan where you will see it often as a reminder of what you're trying to do.  ?? Make your goals specific. Specific goals help you measure your progress. For example, setting a goal to eat 5 helpings of fruits and vegetables 5 days a week is better than a general goal to "eat more vegetables."  ?? Focus on one goal at a time. By doing this, you're less likely to feel overwhelmed and then give up.  ?? When you reach a goal, reward yourself.  Celebrate your new behavior and success for several days, and then think about setting your next goal.  How can you prepare for slip-ups?  It's perfectly normal to try to change a habit, go along fine for a while, and then have a setback. Lots of people try and try again before they reach their goals.   What are the things that might cause a setback for you? If you have tried to change a habit before, think about what helped you and what got in your way.  By thinking about these barriers now, you can plan ahead for how to deal with them if they happen.  There will be times when you slip up and don't make your goal for the week. When that happens, don't get mad at yourself. Learn from the experience. Ask yourself what got in the way of reaching your goal. Positive thinking goes a long way when you're making lifestyle changes.  How can you get support?  ?? Get a partner. It's motivating to know that someone is trying to make the same change that you're making, like being more active or changing your eating  habits. You have someone who is counting on you to help him or her succeed. That person can also remind you how far you've come.  ?? Get friends and family involved. They can exercise with you or encourage you by saying how they admire what you are doing. Family members can join you in your healthy eating efforts. Don't be afraid to tell family and friends that their encouragement makes a big difference to you.  ?? Join a class or support group. People in these groups often have some of the same barriers you have. They can give you support when you don't feel like staying with your plan. They can boost your morale when you need a lift. You'll also find a number of online support groups.  ?? Encourage yourself. When you feel like giving up, don't waste energy feeling bad about yourself. Remember your reason for wanting to change, think about the progress you've made, and give yourself a pep talk and a pat on the back.  ?? Get professional help. A dietitian can help you make your diet healthier while still allowing you to eat foods that you enjoy. A trainer or physical therapist can help design an exercise program that is fun and easy to stay on. A counselor, a Child psychotherapist, or your doctor can help you  overcome hurdles, reduce stress, or quit smoking.  Where can you learn more?  Go to [employer].ca.  Enter [phone] in the search box to learn more about "Learning About Changing a Habit by Setting Goals."  Current as of: Jul 16, 2015  Content Version: 11.4  ?? 2006-2017 [employer], [employer]. Care instructions adapted under license by [employer] (which disclaims liability or warranty for this information). If you have questions about a medical condition or this instruction, always ask your healthcare professional. [employer]wise, Inc[employer] disclaims any warranty or liability for your use of this information.       DASH Diet: Care Instructions  Your Care Instructions    The DASH diet is an eating plan that can help lower your blood pressure. DASH stands for Dietary Approaches to Stop Hypertension. Hypertension is high blood pressure.  The DASH diet focuses on eating foods that are high in calcium, potassium, and magnesium. These nutrients can lower blood pressure. The foods that are highest in these nutrients are fruits, vegetables, low-fat dairy products, nuts, seeds, and legumes. But taking calcium, potassium, and magnesium supplements instead of eating foods that are high in those nutrients does not have the same effect. The DASH diet also includes whole grains, fish, and poultry.  The DASH diet is one of several lifestyle changes your doctor may recommend to lower your high blood pressure. Your doctor may also want you to decrease the amount of sodium in your diet. Lowering sodium while following the DASH diet can lower blood pressure even further than just the DASH diet alone.  Follow-up care is a key part of your treatment and safety. Be sure to make and go to all appointments, and call your doctor if you are having problems. It's also a good idea to know your test results and keep a list of the medicines you take.  How can you care for yourself at home?   Following the DASH diet  ?? Eat 4 to 5 servings of fruit each day. A serving is 1 medium-sized piece of fruit, ?? cup chopped or canned fruit, 1/4 cup dried fruit, or 4 ounces (?? cup) of fruit juice. Choose fruit more  often than fruit juice.  ?? Eat 4 to 5 servings of vegetables each day. A serving is 1 cup of lettuce or raw leafy vegetables, ?? cup of chopped or cooked vegetables, or 4 ounces (?? cup) of vegetable juice. Choose vegetables more often than vegetable juice.  ?? Get 2 to 3 servings of low-fat and fat-free dairy each day. A serving is 8 ounces of milk, 1 cup of yogurt, or 1 ?? ounces of cheese.  ?? Eat 6 to 8 servings of grains each day. A serving is 1 slice of bread, 1 ounce of dry cereal, or ?? cup of cooked rice, pasta, or cooked cereal. Try to choose whole-grain products as much as possible.  ?? Limit lean meat, poultry, and fish to 2 servings each day. A serving is 3 ounces, about the size of a deck of cards.  ?? Eat 4 to 5 servings of nuts, seeds, and legumes (cooked dried beans, lentils, and split peas) each week. A serving is 1/3 cup of nuts, 2 tablespoons of seeds, or ?? cup of cooked beans or peas.  ?? Limit fats and oils to 2 to 3 servings each day. A serving is 1 teaspoon of vegetable oil or 2 tablespoons of salad dressing.  ?? Limit sweets and added sugars to 5 servings or less a week. A serving is 1 tablespoon jelly or jam, ?? cup sorbet, or 1 cup of lemonade.  ?? Eat less than 2,300 milligrams (mg) of sodium a day. If you limit your sodium to 1,500 mg a day, you can lower your blood pressure even more.  Tips for success  ?? Start small. Do not try to make dramatic changes to your diet all at once. You might feel that you are missing out on your favorite foods and then be more likely to not follow the plan. Make small changes, and stick with them. Once those changes become habit, add a few more changes.  ?? Try some of the following:  ?? Make it a goal to eat a fruit or vegetable at every meal and at snacks.  This will make it easy to get the recommended amount of fruits and vegetables each day.  ?? Try yogurt topped with fruit and nuts for a snack or healthy dessert.  ?? Add lettuce, tomato, cucumber, and onion to sandwiches.  ?? Combine a ready-made pizza crust with low-fat mozzarella cheese and lots of vegetable toppings. Try using tomatoes, squash, spinach, broccoli, carrots, cauliflower, and onions.  ?? Have a variety of cut-up vegetables with a low-fat dip as an appetizer instead of chips and dip.  ?? Sprinkle sunflower seeds or chopped almonds over salads. Or try adding chopped walnuts or almonds to cooked vegetables.  ?? Try some vegetarian meals using beans and peas. Add garbanzo or kidney beans to salads. Make burritos and tacos with mashed pinto beans or black beans.  Where can you learn more?  Go to [employer]Stats.cahttp://www.healthwise.net/[employer].  Enter 778-140-7106967 in the search box to learn more about "DASH Diet: Care Instructions."  Current as of: November 25, 2014  Content Version: 11.4  ?? 2006-2017 Health[employer], [employer]. Care instructions adapted under license by [employer] (which disclaims liability or warranty for this information). If you have questions about a medical condition or this instruction, always ask your healthcare professional. [employer] disclaims any warranty or liability for your use of this information.

## 2016-04-19 NOTE — Progress Notes (Signed)
Name and DOB verified        Chief Complaint   Patient presents with   ??? Diabetes     follow up   ??? Results         Health Maintenance reviewed-discussed with patient.      1. Have you been to the ER, urgent care clinic since your last visit?  Hospitalized since your last visit?No    2. Have you seen or consulted any other health care providers outside of the [facility] since your last visit?  Include any pap smears or colon screening. No

## 2016-04-19 NOTE — Progress Notes (Signed)
Patient No Retinal Scan in office declined on 04/19/2016.

## 2016-04-20 LAB — MICROALBUMIN, UR, RAND W/ MICROALB/CREAT RATIO
Creatinine, urine random: 62.5 mg/dL
Microalb/Creat ratio (ug/mg creat.): 4.8 mg/g creat (ref 0.0–30.0)
Microalbumin, urine: 3 ug/mL

## 2016-04-21 NOTE — Telephone Encounter (Signed)
-----   Message from Delfin GantMickenzie R Manning sent at 04/21/2016 12:13 PM EST -----  Regarding: Dr. Michelene HeadyAshrafi/Telephone  Pt requesting the results of urine test. Best contact number [phone] alternated number [phone].

## 2016-04-21 NOTE — Telephone Encounter (Signed)
Message sent to Dr ashrafi

## 2016-05-22 MED ORDER — METOPROLOL TARTRATE 50 MG TAB
50 mg | ORAL_TABLET | ORAL | 7 refills | Status: DC
Start: 2016-05-22 — End: 2016-08-10

## 2016-06-17 ENCOUNTER — Encounter

## 2016-06-19 MED ORDER — TRIAMTERENE-HYDROCHLOROTHIAZIDE 37.5 MG-25 MG CAP
ORAL_CAPSULE | ORAL | 3 refills | Status: DC
Start: 2016-06-19 — End: 2016-08-10

## 2016-06-22 ENCOUNTER — Encounter

## 2016-06-27 MED ORDER — JANUMET 50 MG-500 MG TABLET
50-500 mg | ORAL_TABLET | ORAL | 1 refills | Status: DC
Start: 2016-06-27 — End: 2017-10-15

## 2016-07-12 MED ORDER — LISINOPRIL 10 MG TAB
10 mg | ORAL_TABLET | ORAL | 6 refills | Status: DC
Start: 2016-07-12 — End: 2016-08-10

## 2016-08-10 ENCOUNTER — Ambulatory Visit: Admit: 2016-08-10 | Discharge: 2016-08-10 | Payer: MEDICARE | Attending: Family Medicine | Primary: Geriatric Medicine

## 2016-08-10 DIAGNOSIS — I10 Essential (primary) hypertension: Secondary | ICD-10-CM

## 2016-08-10 MED ORDER — TRIAMTERENE-HYDROCHLOROTHIAZIDE 37.5 MG-25 MG CAP
ORAL_CAPSULE | ORAL | 3 refills | Status: DC
Start: 2016-08-10 — End: 2016-10-23

## 2016-08-10 MED ORDER — METOPROLOL TARTRATE 50 MG TAB
50 mg | ORAL_TABLET | ORAL | 3 refills | Status: DC
Start: 2016-08-10 — End: 2017-04-09

## 2016-08-10 MED ORDER — NYSTATIN 100,000 UNIT/G TOPICAL CREAM
100000 unit/gram | Freq: Two times a day (BID) | CUTANEOUS | 0 refills | Status: DC
Start: 2016-08-10 — End: 2016-09-01

## 2016-08-10 MED ORDER — LISINOPRIL 10 MG TAB
10 mg | ORAL_TABLET | ORAL | 3 refills | Status: DC
Start: 2016-08-10 — End: 2017-01-29

## 2016-08-10 NOTE — Progress Notes (Signed)
Name and DOB verified      Chief Complaint   Patient presents with   ??? Rash     Under Breast Bilateral   ??? Hypertension     f/u         Health Maintenance reviewed-discussed with patient.      1. Have you been to the ER, urgent care clinic since your last visit?  Hospitalized since your last visit?No    2. Have you seen or consulted any other health care providers outside of the [facility] since your last visit?  Include any pap smears or colon screening. No      Order placed for HGB A1C, per Verbal Order from Dr. Micheline Chapmanariush Ashrafi on 08/10/2016 due to DM./cancelled per provider.      Advance Directives Care Planning Information given, patient acknowledged understanding.

## 2016-08-10 NOTE — ACP (Advance Care Planning) (Signed)
Advance Care Planning      Other Legally Authorized Decision Maker would be son Greggory StallionGeorge [phone] as SDM     For My patients who has currently great Decision Making Capacity:     Patient is on no presence of family member stated that she wants to be not DNR at this time,  she likes to be a full code individual, Pt was given the form, he will sign and bring us a copy on the later date.

## 2016-08-10 NOTE — Progress Notes (Signed)
Pt presented for f/u stating that she does not need wellness visit, she does not want to, patient also states that she does not want to get charged extra for just questioning her regarding her alcohol intake her depressive state or any advanced care planning regarding her wellness visit she does not want to get charged extra stating that the primary care doctor charging her extra she already has an endocrinologist and his thyroid doctor providing care for her and she does not want to be discharged extra money and getting her insurance company charged for extra she is only here for follow-up and also complaining of a rash underneath her breast those are all what she needs to be addressed,

## 2016-08-10 NOTE — Patient Instructions (Addendum)
Well Visit, Over 65: Care Instructions  Your Care Instructions    Physical exams can help you stay healthy. Your doctor has checked your overall health and may have suggested ways to take good care of yourself. He or she also may have recommended tests. At home, you can help prevent illness with healthy eating, regular exercise, and other steps.  Follow-up care is a key part of your treatment and safety. Be sure to make and go to all appointments, and call your doctor if you are having problems. It's also a good idea to know your test results and keep a list of the medicines you take.  How can you care for yourself at home?  ?? Reach and stay at a healthy weight. This will lower your risk for many problems, such as obesity, diabetes, heart disease, and high blood pressure.  ?? Get at least 30 minutes of exercise on most days of the week. Walking is a good choice. You also may want to do other activities, such as running, swimming, cycling, or playing tennis or team sports.  ?? Do not smoke. Smoking can make health problems worse. If you need help quitting, talk to your doctor about stop-smoking programs and medicines. These can increase your chances of quitting for good.  ?? Protect your skin from too much sun. When you're outdoors from 10 a.m. to 4 p.m., stay in the shade or cover up with clothing and a hat with a wide brim. Wear sunglasses that block UV rays. Even when it's cloudy, put broad-spectrum sunscreen (SPF 30 or higher) on any exposed skin.  ?? See a dentist one or two times a year for checkups and to have your teeth cleaned.  ?? Wear a seat belt in the car.  ?? Limit alcohol to 2 drinks a day for men and 1 drink a day for women. Too much alcohol can cause health problems.  Follow your doctor's advice about when to have certain tests. These tests can spot problems early.  For men and women  ?? Cholesterol. Your doctor will tell you how often to have this done based  on your overall health and other things that can increase your risk for heart attack and stroke.  ?? Blood pressure. Have your blood pressure checked during a routine doctor visit. Your doctor will tell you how often to check your blood pressure based on your age, your blood pressure results, and other factors.  ?? Diabetes. Ask your doctor whether you should have tests for diabetes.  ?? Vision. Experts recommend that you have yearly exams for glaucoma and other age-related eye problems.  ?? Hearing. Tell your doctor if you notice any change in your hearing. You can have tests to find out how well you hear.  ?? Colon cancer tests. Keep having colon cancer tests as your doctor recommends. You can have one of several types of tests.  ?? Heart attack and stroke risk. At least every 4 to 6 years, you should have your risk for heart attack and stroke assessed. Your doctor uses factors such as your age, blood pressure, cholesterol, and whether you smoke or have diabetes to show what your risk for a heart attack or stroke is over the next 10 years.  ?? Osteoporosis. Talk to your doctor about whether you should have a bone density test to find out whether you have thinning bones. Also ask your doctor about whether you should take calcium and vitamin D supplements.  For women  ??   Pap test and pelvic exam. You may no longer need a Pap test. Talk with your doctor about whether to stop or continue to have Pap tests.  ?? Breast exam and mammogram. Ask how often you should have a mammogram, which is an X-ray of your breasts. A mammogram can spot breast cancer before it can be felt and when it is easiest to treat.  ?? Thyroid disease. Talk to your doctor about whether to have your thyroid checked as part of a regular physical exam. Women have an increased chance of a thyroid problem.  For men  ?? Prostate exam. Talk to your doctor about whether you should have a blood test (called a PSA test) for prostate cancer. Experts disagree on whether  men should have this test. Some experts recommend that you discuss the benefits and risks of the test with your doctor.  ?? Abdominal aortic aneurysm. Ask your doctor whether you should have a test to check for an aneurysm. You may need a test if you ever smoked or if your parent, brother, sister, or child has had an aneurysm.  When should you call for help?  Watch closely for changes in your health, and be sure to contact your doctor if you have any problems or symptoms that concern you.  Where can you learn more?  Go to [email or web address].  Enter [phone] in the search box to learn more about "Well Visit, Over 65: Care Instructions."  Current as of: Jul 16, 2015  Content Version: 11.4  ?? 2006-2017 Healthwise, Incorporated. Care instructions adapted under license by [employer] (which disclaims liability or warranty for this information). If you have questions about a medical condition or this instruction, always ask your healthcare professional. [place] any warranty or liability for your use of this information.       Learning About Meal Planning for Diabetes  Why plan your meals?  Meal planning can be a key part of managing diabetes. Planning meals and snacks with the right balance of carbohydrate, protein, and fat can help you keep your blood sugar at the target level you set with your doctor.  You don't have to eat special foods. You can eat what your family eats, including sweets once in a while. But you do have to pay attention to how often you eat and how much you eat of certain foods.  You may want to work with a dietitian or a certified diabetes educator. He or she can give you tips and meal ideas and can answer your questions about meal planning. This health professional can also help you reach a healthy weight if that is one of your goals.  What plan is right for you?  Your dietitian or diabetes educator may suggest that you start with the  plate format or carbohydrate counting.  The plate format  The plate format is a simple way to help you manage how you eat. You plan meals by learning how much space each food should take on a plate. Using the plate format helps you spread carbohydrate throughout the day. It can make it easier to keep your blood sugar level within your target range. It also helps you see if you're eating healthy portion sizes.  To use the plate format, you put non-starchy vegetables on half your plate. Add meat or meat substitutes on one-quarter of the plate. Put a grain or starchy vegetable (such as brown rice or a potato) on the final quarter of the plate. You  can add a small piece of fruit and some low-fat or fat-free milk or yogurt, depending on your carbohydrate goal for each meal.  Here are some tips for using the plate format:  ?? Make sure that you are not using an oversized plate. A 9-inch plate is best. Many restaurants use larger plates.  ?? Get used to using the plate format at home. Then you can use it when you eat out.  ?? Write down your questions about using the plate format. Talk to your doctor, a dietitian, or a diabetes educator about your concerns.  Carbohydrate counting  With carbohydrate counting, you plan meals based on the amount of carbohydrate in each food. Carbohydrate raises blood sugar higher and more quickly than any other nutrient. It is found in desserts, breads and cereals, and fruit. It's also found in starchy vegetables such as potatoes and corn, grains such as rice and pasta, and milk and yogurt. Spreading carbohydrate throughout the day helps keep your blood sugar levels within your target range.  Your daily amount depends on several things, including your weight, how active you are, which diabetes medicines you take, and what your goals are for your blood sugar levels. A registered dietitian or diabetes educator can help you plan how much carbohydrate to include in each meal and snack.   A guideline for your daily amount of carbohydrate is:  ?? 45 to 60 grams at each meal. That's about the same as 3 to 4 carbohydrate servings.  ?? 15 to 20 grams at each snack. That's about the same as 1 carbohydrate serving.  The Nutrition Facts label on packaged foods tells you how much carbohydrate is in a serving of the food. First, look at the serving size on the food label. Is that the amount you eat in a serving? All of the nutrition information on a food label is based on that serving size. So if you eat more or less than that, you'll need to adjust the other numbers. Total carbohydrate is the next thing you need to look for on the label. If you count carbohydrate servings, one serving of carbohydrate is 15 grams.  For foods that don't come with labels, such as fresh fruits and vegetables, you'll need a guide that lists carbohydrate in these foods. Ask your doctor, dietitian, or diabetes educator about books or other nutrition guides you can use.  If you take insulin, you need to know how many grams of carbohydrate are in a meal. This lets you know how much rapid-acting insulin to take before you eat. If you use an insulin pump, you get a constant rate of insulin during the day. So the pump must be programmed at meals to give you extra insulin to cover the rise in blood sugar after meals.  When you know how much carbohydrate you will eat, you can take the right amount of insulin. Or, if you always use the same amount of insulin, you need to make sure that you eat the same amount of carbohydrate at meals.  If you need more help to understand carbohydrate counting and food labels, ask your doctor, dietitian, or diabetes educator.  How do you get started with meal planning?  Here are some tips to get started:  ?? Plan your meals a week at a time. Don't forget to include snacks too.  ?? Use cookbooks or online recipes to plan several main meals. Plan some  quick meals for busy nights. You also can double some recipes  that freeze well. Then you can save half for other busy nights when you don't have time to cook.  ?? Make sure you have the ingredients you need for your recipes. If you're running low on basic items, put these items on your shopping list too.  ?? List foods that you use to make breakfasts, lunches, and snacks. List plenty of fruits and vegetables.  ?? Post this list on the refrigerator. Add to it as you think of more things you need.  ?? Take the list to the store to do your weekly shopping.  Follow-up care is a key part of your treatment and safety. Be sure to make and go to all appointments, and call your doctor if you are having problems. It's also a good idea to know your test results and keep a list of the medicines you take.  Where can you learn more?  Go to [email or web address]://www.healthwise.net/[employer]Help[employer]ctions.  Enter [phone]) [phone] in the search box to learn more about "Learning About Meal Planning for Diabetes."  Current as of: May 17, 2015  Content Version: 11.4  ?? 2006-2017 Healthwise, Inc[employer]. Care instructions adapted under license by [employer] (which disclaims liability or warranty for this information). If you have questions about a medical condition or this instruction, always ask your healthcare professional. Healthwise, Incorporated disclaims any warranty or liability for your use of this information.      Medicare Wellness Visit, Female    The best way to live healthy is to have a lifestyle where you eat a well-balanced diet, exercise regularly, limit alcohol use, and quit all forms of tobacco/nicotine, if applicable.     Regular preventive services are another way to keep healthy. Preventive services (vaccines, screening tests, monitoring & exams) can help personalize your care plan, which helps you manage your own care. Screening tests can find health problems at the earliest stages, when they are easiest to treat.      [facility] follows the current, evidence-based guidelines published by the [place] [employer] Insurance[employer]ventive Services Task Force (USPSTF) when recommending preventive services for our patients. Because we follow these guidelines, sometimes recommendations change over time as research supports it. (For example, mammograms used to be recommended annually. Even though Medicare will still pay for an annual mammogram, the newer guidelines recommend a mammogram every two years for women of average risk.)    Of course, you and your provider may decide to screen more often for some diseases, based on your risk and co-morbidities (chronic disease you are already diagnosed with).     Preventive services for you include:    - Medicare offers their members a free annual wellness visit, which is time for you and your primary care provider to discuss and plan for your preventive service needs. Take advantage of this benefit every year!    -All people over age 74 should receive the recommended pneumonia vaccines. Current USPSTF guidelines recommend a series of two vaccines for the best pneumonia protection.     -All adults should have a yearly flu vaccine and a tetanus vaccine every 10 years. All adults age 74 years should receive a shingles vaccine once in their lifetime.      -A bone mass density test is recommended when a woman turns 65 to screen for osteoporosis. This test is only recommended once as a screening. Some providers will use this same test as a disease monitoring tool if you already have osteoporosis.    -All adults age 74-70 years  who are overweight should have a diabetes screening test once every three years.     -Other screening tests & preventive services for persons with diabetes include: an eye exam to screen for diabetic retinopathy, a kidney function test, a foot exam, and stricter control over your cholesterol.     -Cardiovascular screening for adults with routine risk involves an  electrocardiogram (ECG) at intervals determined by the provider.     -Colorectal cancer screenings should be done for adults age 62-75 years with normal risk. There are a number of acceptable methods of screening for this type of cancer. Each test has its own benefits and drawbacks. Discuss with your provider what is most appropriate for you during your annual wellness visit. The different tests include: colonoscopy (considered the best screening method), a fecal occult blood test, a fecal DNA test, and sigmoidoscopy.    -Breast cancer screenings are recommended every other year for women of normal risk age 32-74 years.     -Cervical cancer screenings for women over age 27 are only recommended with certain risk factors.     -All adults born between [birth year] and 1965 should be screened once for Hepatitis C.     Here is a list of your current Health Maintenance items (your personalized list of preventive services) with a due date:  Health Maintenance Due   Topic Date Due   ??? Eye Exam  04/27/2016   ??? Annual Well Visit  05/17/2016   ??? Diabetic Foot Care  08/23/2016        Rash: Care Instructions  Your Care Instructions  A rash is any irritation or inflammation of the skin. Rashes have many possible causes, including allergy, infection, illness, heat, and emotional stress.  Follow-up care is a key part of your treatment and safety. Be sure to make and go to all appointments, and call your doctor if you are having problems. It's also a good idea to know your test results and keep a list of the medicines you take.  How can you care for yourself at home?  ?? Wash the area with water only. Soap can make dryness and itching worse. Pat dry.  ?? Put cold, wet cloths on the rash to reduce itching.  ?? Keep cool, and stay out of the sun.  ?? Leave the rash open to the air as much of the time as possible.  ?? Sometimes petroleum jelly (Vaseline) can help relieve the discomfort  caused by a rash. A moisturizing lotion, such as Cetaphil, also may help. Calamine lotion may help for rashes caused by contact with something (such as a plant or soap) that irritated the skin. Use it 3 or 4 times a day.  ?? If your doctor prescribed a cream, use it as directed. If your doctor prescribed medicine, take it exactly as directed.  ?? If your rash itches so badly that it interferes with your normal activities, take an over-the-counter antihistamine, such as diphenhydramine (Benadryl) or loratadine (Claritin). Read and follow all instructions on the label.  When should you call for help?  Call your doctor now or seek immediate medical care if:  ? ?? You have signs of infection, such as:  ?? Increased pain, swelling, warmth, or redness.  ?? Red streaks leading from the area.  ?? Pus draining from the area.  ?? A fever.   ? ?? You have joint pain along with the rash.   ?Watch closely for changes in your health, and be sure to  contact your doctor if:  ? ?? Your rash is changing or getting worse. For example, call if you have pain along with the rash, the rash is spreading, or you have new blisters.   ? ?? You do not get better after 1 week.   Where can you learn more?  Go to [employer]Stats.ca.  Enter [phone] in the search box to learn more about "Rash: Care Instructions."  Current as of: December 17, 2014  Content Version: 11.4  ?? 2006-2017 Healthwise, Inc[employer]. Care instructions adapted under license by [employer] (which disclaims liability or warranty for this information). If you have questions about a medical condition or this instruction, always ask your healthcare professional. Healthwise, Incorporated disclaims any warranty or liability for your use of this information.       Learning About High Blood Pressure  What is high blood pressure?    Blood pressure is a measure of how hard the blood pushes against the walls  of your arteries. It's normal for blood pressure to go up and down throughout the day, but if it stays up, you have high blood pressure. Another name for high blood pressure is hypertension.  Two numbers tell you your blood pressure. The first number is the systolic pressure. It shows how hard the blood pushes when your heart is pumping. The second number is the diastolic pressure. It shows how hard the blood pushes between heartbeats, when your heart is relaxed and filling with blood.  A blood pressure of less than 120/80 (say "120 over 80") is ideal for an adult. High blood pressure is 140/90 or higher. You have high blood pressure if your top number is 140 or higher or your bottom number is 90 or higher, or both. Many people fall into the category in between, called prehypertension. People with prehypertension need to make lifestyle changes to bring their blood pressure down and help prevent or delay high blood pressure.  What happens when you have high blood pressure?  ?? Blood flows through your arteries with too much force. Over time, this damages the walls of your arteries. But you can't feel it. High blood pressure usually doesn't cause symptoms.  ?? Fat and calcium start to build up in your arteries. This buildup is called plaque. Plaque makes your arteries narrower and stiffer. Blood can't flow through them as easily.  ?? This lack of good blood flow starts to damage some of the organs in your body. This can lead to problems such as coronary artery disease and heart attack, heart failure, stroke, kidney failure, and eye damage.  How can you prevent high blood pressure?  ?? Stay at a healthy weight.  ?? Try to limit how much sodium you eat to less than 2,300 milligrams (mg) a day. If you limit your sodium to 1,500 mg a day, you can lower your blood pressure even more.  ?? Buy foods that are labeled "unsalted," "sodium-free," or "low-sodium."  Foods labeled "reduced-sodium" and "light sodium" may still have too much sodium.  ?? Flavor your food with garlic, lemon juice, onion, vinegar, herbs, and spices instead of salt. Do not use soy sauce, steak sauce, onion salt, garlic salt, mustard, or ketchup on your food.  ?? Use less salt (or none) when recipes call for it. You can often use half the salt a recipe calls for without losing flavor.  ?? Be physically active. Get at least 30 minutes of exercise on most days of the week. Walking is a  good choice. You also may want to do other activities, such as running, swimming, cycling, or playing tennis or team sports.  ?? Limit alcohol to 2 drinks a day for men and 1 drink a day for women.  ?? Eat plenty of fruits, vegetables, and low-fat dairy products. Eat less saturated and total fats.  How is high blood pressure treated?  ?? Your doctor will suggest making lifestyle changes. For example, your doctor may ask you to eat healthy foods, quit smoking, lose extra weight, and be more active.  ?? If lifestyle changes don't help enough or your blood pressure is very high, you will have to take medicine every day.  Follow-up care is a key part of your treatment and safety. Be sure to make and go to all appointments, and call your doctor if you are having problems. It's also a good idea to know your test results and keep a list of the medicines you take.  Where can you learn more?  Go to [employer]Stats.ca.  Enter P501 in the search box to learn more about "Learning About High Blood Pressure."  Current as of: November 25, 2014  Content Version: 11.4  ?? 2006-2017 Healthwise, [employer]. Care instructions adapted under license by [employer] (which disclaims liability or warranty for this information). If you have questions about a medical condition or this instruction, always ask your healthcare professional. [employer]wise, Inc[employer] disclaims any warranty or liability for your use of this  information.

## 2016-08-10 NOTE — Progress Notes (Signed)
This is the Subsequent Medicare Annual Wellness Exam, performed 12 months or more after the Initial AWV or the last Subsequent AWV    I have reviewed the patient's medical history in detail and updated the computerized patient record.     History   Present for CPE, last Complete Physical exam was 2017,  Not Up todate w/ all vaccinations such as her Tdap, pneumonia and  influenza vaccines today offered patient repeatedly refused to get them, last tetanus vaccine was unknown, last mammog was nl and In 2016,   last pap smear normal and was in few yrs ago,   ??  last colonoscopy was normal and was >7165yrs Ago, refusing to get yr  ++ TKR on the rt side as her past surgical hx,  last bone dexa scan was abnormal and was with osteopenia 2 yr Ago,   No family hx of breast cancer with sisters   ++family hx of colon cancer with sister, father deceased from lung can ++cigs, mother deceased at age of 988yo from old age, not sexaully active , not physically active,  compliant w/ meds,++ Rf needed for today for her meds.     Pt presented for f/u stating that she does not need wellness visit, she does not want to be questioned and therefore gets charged, patient also states that she does not want to get charged extra for just questioning regarding her alcohol intake her depressive state or any advanced care planning, related to her wellness visit she is stating that the primary care doctor charging her coverage provider extra money,  she already has an endocrinologist and his thyroid doctor providing care for her and she does not want to be charged extra money and stating she is only here for follow-up, repeat labs, and also complaining of a rash underneath her breast  B/l those are all what she needs to be addressed,       Seen the endo 2 months ago with full loads of labs and states that she see endo Q 6 months  For the diabetic and her thyroid care, pt states that  they only checked her thyroid and wants to repeat checking every things again for her, but refusing to get AWV done today because she does not want her insurance company gets charged for no reason and does not believe in AWV and stating it is another way for the PCP to make more money out of her insurance company !!!!!!!!!!!!!!!! pt recently had done a lots of lab done almost in mid April of 2018, not Compliant w/ meds, having no diabetic diet, and not doing much of daily exercise,   Denies any tingling sensation, polyurea and polydipsia, last a1c was unknown at ENDO,      But she has a rash skin beneath her breast b/l which was examined with the presence of a female nurse, ++itchiness, no bleeding not painful, no self tx, not getting better,     Past Medical History:   Diagnosis Date   ??? Abdominal pain 05/08/2013   ??? Abnormal finding on EKG 11/17/2013   ??? ACP (advance care planning) 05/03/2015   ??? Arthritis    ??? 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 Chronic pain of left knee 08/13/2014   ??? Chronic right hip pain 03/17/2013   ??? 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 Diabetes (HCC)    ??? Elevated  BUN 09/13/2011   ??? Finger lesion 03/02/2016   ??? Gallbladder polyp 04/08/2013   ??? Gastrointestinal disorder     GERD   ??? GERD (gastroesophageal reflux disease)    ??? Hypercholesterolemia    ??? Hypertension    ??? 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 Microalbuminuria 08/29/2013   ??? 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 On aspirin at home 05/03/2015   ??? Other unknown and unspecified cause of morbidity or mortality     hx of bronchitis   ??? Rash, skin 09/25/2012   ??? 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 Slow transit constipation 08/13/2014   ??? Umbilical hernia 05/08/2013      Past Surgical History:   Procedure Laterality Date    ??? 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 HX ORTHOPAEDIC  2000    screw in foot left   ??? HX OTHER SURGICAL      colonoscopy     Current Outpatient Prescriptions   Medication Sig Dispense Refill   ??? peripheral nerve diagnos-devic (LANCETS MISC.) Test blood sugar 4 times a day     ??? JANUMET 50-500 mg per tablet take 1 tablet by mouth once daily 30 Tab 1   ??? triamterene-hydroCHLOROthiazide (DYAZIDE) 37.5-25 mg per capsule take 1 capsule by mouth once daily 30 Cap 3   ??? metoprolol tartrate (LOPRESSOR) 50 mg tablet take 1 tablet by mouth twice a day 60 Tab 7   ??? fluticasone-umeclidin-vilanter (TRELEGY ELLIPTA) 100-62.5-25 mcg dsdv Take 1 Puff by inhalation daily. 1 Inhaler 6   ??? oxyCODONE IR (ROXICODONE) 10 mg tab immediate release tablet take 1 tablet by mouth UP TO three times a day  0   ??? rosuvastatin (CRESTOR) 20 mg tablet Take 1 Tab by mouth nightly. 30 Tab 6   ??? CALCIUM 600 + D tablet take 1 tablet by mouth twice a day 60 Tab 11   ??? COL-RITE 100 mg capsule take 1 capsule by mouth three times a day if needed for constipation 90 Cap 3   ??? aspirin delayed-release 81 mg tablet Take 1 Tab by mouth daily. 30 Tab 11   ??? lisinopril (PRINIVIL, ZESTRIL) 10 mg tablet take 1 tablet by mouth once daily 30 Tab 6   ??? FREESTYLE LANCETS 28 gauge misc   1   ??? methimazole (TAPAZOLE) 5 mg tablet Take 5 mg by mouth daily.     ??? glucose blood VI test strips (FREESTYLE LITE STRIPS) strip Test twice daily. 1 Package 11   ??? Lancets (FREESTYLE LANCETS) misc Test blood sugar 4 times a day 1 Package 11   ??? cyanocobalamin 1,000 mcg tablet Take 1,000 mcg by mouth daily.     ??? aspirin-calcium carbonate 81 mg-300 mg calcium(777 mg) tab Take 81 mg by mouth.     ??? oxyCODONE IR (OXY-IR) 15 mg immediate release tablet TAKE 1 TABLET TWICE A DAY FOR CHRONIC PAIN  0   ??? oxyCODONE-acetaminophen (PERCOCET 10) 10-325 mg per tablet take 1/2 TO 1 tablet once daily if needed for pain  0     No Known Allergies  Family History    Problem Relation Age of Onset   ??? Diabetes Mother    ??? Heart Disease Mother      rheumatic fever   ??? Breast Cancer Mother    ??? Cancer  Father      lung   ??? Cancer Sister      colon & breast   ??? Breast Cancer Sister    ??? Cancer Brother      throat     Social History   Substance Use Topics   ??? Smoking status: Former Smoker     Packs/day: 0.25     Years: 1.00     Types: Cigarettes     Quit date: 06/03/1991   ??? Smokeless tobacco: Never Used   ??? Alcohol use No     Patient Active Problem List   Diagnosis Code   ??? HTN, goal below 130/80 I10   ??? Diabetes mellitus type II, uncontrolled (HCC) E11.65   ??? 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, adult (HCC) E66.01, Z68.44   ??? Neuropathic arthritis M14.60   ??? Elevated BUN R79.9   ??? Low TSH level R94.6   ??? Abnormal urine finding R82.90   ??? Acute bronchitis J20.9   ??? Acid reflux K21.9   ??? Hypercholesterolemia E78.00   ??? Rash, skin R21   ??? Lung nodule seen on imaging study R91.1   ??? Intrathoracic goiter E04.9   ??? Chronic right hip pain M25.551, G89.29   ??? Abdominal pain, chronic, right lower quadrant R10.31, G89.29   ??? Gallbladder polyp K82.4   ??? Umbilical hernia K42.9   ??? Abdominal pain R10.9   ??? Chronic pain disorder G89.4   ??? Microalbuminuria R80.9   ??? Abnormal finding on EKG R94.31   ??? Need for diphtheria-tetanus-pertussis (Tdap) vaccine, adult/adolescent Z23   ??? Slow transit constipation K59.01   ??? Chronic pain of left knee M25.562, G89.29   ??? Osteopenia M85.80   ??? On aspirin at home Z79.82   ??? ACP (advance care planning) Z71.89   ??? Encounter for immunization Z23   ??? Need for pneumococcal vaccination Z23   ??? 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 Finger lesion L98.9   ??? 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 COPD (chronic obstructive pulmonary disease) with chronic bronchitis (HCC) J44.9       Depression Risk Factor Screening:     PHQ over the last two weeks 08/10/2016   Little interest or pleasure in doing things Not at all    Feeling down, depressed or hopeless Not at all   Total Score PHQ 2 0     Alcohol Risk Factor Screening:   You do not drink alcohol or very rarely.    Functional Ability and Level of Safety:   Hearing Loss  Hearing is good.    Activities of Daily Living  The home contains: handrails, grab bars and rugs  Patient does total self care    Fall Risk  Fall Risk Assessment, last 12 mths 08/10/2016   Able to walk? Yes   Fall in past 12 months? No       Abuse Screen  Patient is not abused    Cognitive Screening   Evaluation of Cognitive Function:  Has your family/caregiver stated any concerns about your memory: no  Normal    Patient Care Team   Patient Care Team:  Micheline Chapman, MD as PCP - General (Family Practice)  Melina Modena, MD (Endocrinology)  Diane Karen Kitchens  Joann Connor  [place] Beckie Salts, MD as Physician (Cardiology)    Assessment/Plan   Education and counseling provided:  Are appropriate based on today's review and evaluation  End-of-Life planning (with  patient's consent)    Diagnoses and all orders for this visit:    1. HTN, goal below 140/90  -     triamterene-hydroCHLOROthiazide (DYAZIDE) 37.5-25 mg per capsule; take 1 capsule by mouth once daily  -     metoprolol tartrate (LOPRESSOR) 50 mg tablet; take 1 tablet by mouth twice a day  -     lisinopril (PRINIVIL, ZESTRIL) 10 mg tablet; take 1 tablet by mouth once daily    2. Advanced directives, counseling/discussion    3. Screening for alcoholism    4. Screening for depression    5. Screen for colon cancer    6. Rash in adult  -     nystatin (MYCOSTATIN) topical cream; Apply  to affected area two (2) times a day. Apply to the affected area        Health Maintenance Due   Topic Date Due   ??? EYE EXAM RETINAL OR DILATED Q1  04/27/2016   ??? MEDICARE YEARLY EXAM  05/17/2016   ??? FOOT EXAM Q1  08/23/2016     Today visit, she refused to get her annual wellness visit done secondary to stamping it not done for pt's care but done for financial benefits for  PCP!!!!!!!!, she stating that the pcp questioning patients to get more informations are all done with any medical reasons, the primary care doctors charging her insurance company for no good reasons!!!!!! Regardless, she refused to get her wellness visit.  instead, she wanted to be checked for diabetic thyroid other redone done labs even though she has a diabetic doctor and her A1c was recently checked almost 2 months ago, she insisting to get full labs and a full work ups done without getting AWV because does not want to answer questions, in addition, she is insisting that the pcp calls the pharmaceutical companies so that she can get samples of the Janumet for her diabetic care,  for which she was told that we do not carry samples and if she needs any diabetic or any thyroid care she needs to get those refilled and readdressed from her diabetic doctor regardless today's annual wellness visit was canceled after it was done secondary to patient disaccordance,   her blood pressure was checked and the rash was as well check examined and  Addressed, in addition,  she was also told to find a new primary care doctor so they can get her annual wellness visit without getting her insurance company be charged, patient was told that no provider to her annual wellness visit without questioning her regarding the home visits free of charge, today's office visit was very awkward with abnormal behavior and all the other relative orders secondary to annual wellness visit was also will be canceled and discontinued the only problems that was addressed as per patient request was for blood pressure check, refill for blood pressure medications and the rash beneath the breasts bilaterally and she was told if not better she need to follow-up with the dermatologist,

## 2016-08-10 NOTE — ACP (Advance Care Planning) (Signed)
Advance Care Planning      Other Legally Authorized Decision Maker would be son George [phone] as SDM     For My patients who has currently great Decision Making Capacity:     Patient is on no presence of family member stated that she wants to be not DNR at this time,  she likes to be a full code individual, Pt was given the form, he will sign and bring us a copy on the later date.

## 2016-08-14 MED ORDER — FLUTICASONE 100 MCG-UMECLID 62.5 MCG-VILANT 25 MCG POWD FOR INHALATION
Freq: Every day | RESPIRATORY_TRACT | 3 refills | Status: DC
Start: 2016-08-14 — End: 2016-09-26

## 2016-08-14 NOTE — Telephone Encounter (Signed)
[facility] Requests A 90 day supply on the patients behalf.     Last Visit: 08/10/16-Ashrafi  Next Appt: None  Previous Refill Encounter: 04/19/16-1 inhaler+6 refills    Requested Prescriptions     Pending Prescriptions Disp Refills   ??? fluticasone-umeclidin-vilanter (TRELEGY ELLIPTA) 100-62.5-25 mcg dsdv 180 Each 3     Sig: Take 1 Puff by inhalation daily.

## 2016-09-01 ENCOUNTER — Ambulatory Visit: Admit: 2016-09-01 | Discharge: 2016-09-01 | Payer: MEDICARE | Attending: Internal Medicine | Primary: Geriatric Medicine

## 2016-09-01 DIAGNOSIS — Z1239 Encounter for other screening for malignant neoplasm of breast: Secondary | ICD-10-CM

## 2016-09-01 NOTE — Progress Notes (Signed)
Allison Newman is a 76 y.o. female and presents with New Patient and Establish Care  .  Subjective:  Pt is here to establish care  Pt has changed primary care provider.  Pt has no complaints.  Pt is very upset and uintermit tearful.  She relays she was dismissed from her previous practice due to a series of misunderstandings.      PMH-  Morbid obesity-  Wt Readings from Last 3 Encounters:   09/01/16 326 lb 6.4 oz (148.1 kg)   08/10/16 324 lb 3.2 oz (147.1 kg)   04/19/16 327 lb (148.3 kg)     CKD 2-  Lab Results   Component Value Date/Time    GFR est AA 76 03/02/2016 01:12 PM    GFR est non-AA 66 03/02/2016 01:12 PM    Creatinine (POC) 0.8 04/11/2013 11:28 AM    Creatinine 0.87 03/02/2016 01:12 PM    BUN 26 03/02/2016 01:12 PM    Sodium 140 03/02/2016 01:12 PM    Potassium 4.7 03/02/2016 01:12 PM    Chloride 100 03/02/2016 01:12 PM    CO2 20 03/02/2016 01:12 PM     Type 2 DM-controlled  Lab Results   Component Value Date/Time    Hemoglobin A1c 6.3 (H) 08/25/2015 03:55 PM    Hemoglobin A1c (POC) 7.7 03/02/2016 01:12 PM    Hemoglobin A1c, External 6.6 07/29/2014     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 Abdominal pain 05/08/2013   ??? Abnormal finding on EKG 11/17/2013   ??? ACP (advance care planning) 05/03/2015   ??? Arthritis    ??? 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 Chronic pain of left knee 08/13/2014   ??? Chronic right hip pain 03/17/2013   ??? 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 Diabetes (HCC)    ??? Elevated BUN 09/13/2011   ??? Finger lesion 03/02/2016   ??? Gallbladder polyp 04/08/2013   ??? Gastrointestinal disorder     GERD   ??? GERD (gastroesophageal reflux disease)    ??? Hypercholesterolemia    ??? Hypertension    ??? 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 Microalbuminuria 08/29/2013    ??? 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 On aspirin at home 05/03/2015   ??? Other unknown and unspecified cause of morbidity or mortality     hx of bronchitis   ??? Rash, skin 09/25/2012   ??? 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 Slow transit constipation 08/13/2014   ??? Umbilical hernia 05/08/2013     Past Surgical History:   Procedure Laterality Date   ??? 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 HX ORTHOPAEDIC  2000    screw in foot left   ??? HX OTHER SURGICAL      colonoscopy     Social History     Social History   ??? Marital status: DIVORCED     Spouse name: N/A   ??? Number of children: N/A   ??? Years of education: N/A     Social History Main Topics   ??? Smoking status: Former Smoker     Packs/day:  0.25     Years: 1.00     Types: Cigarettes     Quit date: 06/03/1991   ??? Smokeless tobacco: Never Used   ??? Alcohol use No   ??? Drug use: No   ??? Sexual activity: No     Other Topics Concern   ??? None     Social History Narrative     Family History   Problem Relation Age of Onset   ??? Diabetes Mother    ??? Heart Disease Mother      rheumatic fever   ??? Breast Cancer Mother    ??? Cancer Father      lung   ??? Cancer Sister      colon & breast   ??? Breast Cancer Sister    ??? Cancer Brother      throat     Current Outpatient Prescriptions   Medication Sig Dispense Refill   ??? fluticasone-umeclidin-vilanter (TRELEGY ELLIPTA) 100-62.5-25 mcg dsdv Take 1 Puff by inhalation daily. 180 Each 3   ??? aspirin-calcium carbonate 81 mg-300 mg calcium(777 mg) tab Take 81 mg by mouth.     ??? peripheral nerve diagnos-devic (LANCETS MISC.) Test blood sugar 4 times a day     ??? triamterene-hydroCHLOROthiazide (DYAZIDE) 37.5-25 mg per capsule take 1 capsule by mouth once daily 30 Cap 3   ??? metoprolol tartrate (LOPRESSOR) 50 mg tablet take 1 tablet by mouth twice a day 60 Tab 3    ??? lisinopril (PRINIVIL, ZESTRIL) 10 mg tablet take 1 tablet by mouth once daily 30 Tab 3   ??? JANUMET 50-500 mg per tablet take 1 tablet by mouth once daily 30 Tab 1   ??? oxyCODONE IR (OXY-IR) 15 mg immediate release tablet TAKE 1 TABLET TWICE A DAY FOR CHRONIC PAIN  0   ??? CALCIUM 600 + D tablet take 1 tablet by mouth twice a day 60 Tab 11   ??? COL-RITE 100 mg capsule take 1 capsule by mouth three times a day if needed for constipation 90 Cap 3   ??? aspirin delayed-release 81 mg tablet Take 1 Tab by mouth daily. 30 Tab 11   ??? oxyCODONE-acetaminophen (PERCOCET 10) 10-325 mg per tablet take 1/2 TO 1 tablet once daily if needed for pain  0   ??? FREESTYLE LANCETS 28 gauge misc   1   ??? methimazole (TAPAZOLE) 5 mg tablet Take 5 mg by mouth daily.     ??? glucose blood VI test strips (FREESTYLE LITE STRIPS) strip Test twice daily. 1 Package 11   ??? Lancets (FREESTYLE LANCETS) misc Test blood sugar 4 times a day 1 Package 11   ??? cyanocobalamin 1,000 mcg tablet Take 1,000 mcg by mouth daily.     ??? rosuvastatin (CRESTOR) 20 mg tablet Take 1 Tab by mouth nightly. 30 Tab 6     No Known Allergies    Objective:  Visit Vitals   ??? BP 143/56 (BP 1 Location: Left arm, BP Patient Position: Prone)   ??? Pulse 66   ??? Temp 97.8 ??F (36.6 ??C) (Oral)   ??? Resp 20   ??? Ht 5\' 4"  (1.626 m)   ??? Wt 326 lb 6.4 oz (148.1 kg)   ??? LMP  (LMP Unknown)   ??? SpO2 96%   ??? BMI 56.03 kg/m2     Physical Exam:   General appearance - alert, obese, and tearful   Mental status - alert, oriented to person, place, and time  EYE-EOMI  Neck -  supple, no significant adenopathy   Chest - clear to auscultation, distant BS  Heart - normal rate, regular rhythm, normal S1, S2, 2/6 systolic murmur  Abdomen - soft, nontender, nondistended, no masses or organomegaly  Ext-peripheral pulses normal, no pedal edema, no clubbing or cyanosis  Skin-Warm and dry. no hyperpigmentation, vitiligo, or suspicious lesions  Neuro -alert, oriented, normal speech, walks w walker     Results for orders placed or performed in visit on 04/19/16   MICROALBUMIN, UR, RAND W/ MICROALBUMIN/CREA RATIO   Result Value Ref Range    Creatinine, urine 62.5 Not Estab. mg/dL    Microalbumin, urine 3.0 Not Estab. ug/mL    Microalb/Creat ratio (ug/mg creat.) 4.8 0.0 - 30.0 mg/g creat       Assessment/Plan:    ICD-10-CM ICD-9-CM    1. Breast cancer screening Z12.31 V76.10 MAM MAMMO BI SCREENING INCL CAD   2. Establishing care with new doctor, encounter for Z76.89 V65.8    3. Diabetes mellitus with microalbuminuria (HCC) E11.29 250.40     R80.9 791.0    4. 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 COPD (chronic obstructive pulmonary disease) with chronic bronchitis (HCC) J44.9 491.20    6. Morbid obesity (HCC) E66.01 278.01    7. Chronic pain of left knee M25.562 719.46     G89.29 338.29    8. Hypercholesterolemia E78.00 272.0      Orders Placed This Encounter   ??? MAM MAMMO BI SCREENING INCL CAD     Standing Status:   Future     Standing Expiration Date:   10/01/2017     Order Specific Question:   Reason for Exam     Answer:   screening     Cont current mngmnt  F/u 3 mths/prn  There are no Patient Instructions on file for this visit.   Follow-up Disposition:  Return in about 3 months (around 12/02/2016) for BP and DM f/u.      I have reviewed with the patient details of the assessment and plan and all questions were answered. Relevent patient education was performed.The most recent lab findings were reviewed with the patient.    An After Visit Summary was printed and given to the patient.

## 2016-09-01 NOTE — Progress Notes (Signed)
1. Have you been to the ER, urgent care clinic since your last visit?  Hospitalized since your last visit?No.    2. Have you seen or consulted any other health care providers outside of the [facility] since your last visit?  Include any pap smears or colon screening. No.  PHQ over the last two weeks 09/01/2016   Little interest or pleasure in doing things Not at all   Feeling down, depressed or hopeless Not at all   Total Score PHQ 2 0

## 2016-09-18 MED ORDER — CALCIUM 600 + D(3) 600 MG-10 MCG (400 UNIT) TABLET
600 mg-10 mcg (400 unit) | ORAL_TABLET | ORAL | 5 refills | Status: DC
Start: 2016-09-18 — End: 2017-04-24

## 2016-09-18 MED ORDER — CALCIUM 600 + D(3) 600 MG-10 MCG (400 UNIT) TABLET
600 mg-10 mcg (400 unit) | ORAL_TABLET | ORAL | 11 refills | Status: AC
Start: 2016-09-18 — End: ?

## 2016-09-26 ENCOUNTER — Other Ambulatory Visit: Payer: Self-pay | Admitting: Internal Medicine

## 2016-09-26 DIAGNOSIS — Z1231 Encounter for screening mammogram for malignant neoplasm of breast: Secondary | ICD-10-CM

## 2016-09-26 MED ORDER — FLUTICASONE 100 MCG-UMECLID 62.5 MCG-VILANT 25 MCG POWD FOR INHALATION
Freq: Every day | RESPIRATORY_TRACT | 0 refills | Status: DC
Start: 2016-09-26 — End: 2016-09-28

## 2016-09-26 NOTE — Telephone Encounter (Signed)
I left voicemail for patient to call the office. To let patient know we have her message and we are working on it. Also to inform patient that Dr. Mechele CollinElliott was on vacation.

## 2016-09-26 NOTE — Telephone Encounter (Signed)
Last Visit: 08/10/16-Ashrafi  Next Appt: None  Previous Refill Encounter: N/A    Requested Prescriptions     Pending Prescriptions Disp Refills   ??? metFORMIN (GLUCOPHAGE) 1,000 mg tablet 180 Tab 3     Sig: Take 1 Tab by mouth two (2) times daily (with meals).

## 2016-09-26 NOTE — Telephone Encounter (Signed)
No, deferring to Dr. Mechele CollinElliott as this med is STRONGER with 3 types of inhaler in one versus BREO.

## 2016-09-26 NOTE — Telephone Encounter (Signed)
-----   Message from Hyman HopesLatresha D Simmons sent at 09/26/2016 10:56 AM EDT -----  Regarding: Dr. Bertis RuddyElliott/telephone  The patient is requesting that Rx Breo Inhaler is called into the pharmacy. This medication is more cost effective than the previous medication that was requested. [place]([facility])[phone]

## 2016-09-27 MED ORDER — METFORMIN 1,000 MG TAB
1000 mg | ORAL_TABLET | Freq: Two times a day (BID) | ORAL | 3 refills | Status: DC
Start: 2016-09-27 — End: 2017-04-19

## 2016-09-27 NOTE — Telephone Encounter (Signed)
Pt wants a cheaper inhaler Breo  [facility]  [phone] or [phone]

## 2016-09-27 NOTE — Telephone Encounter (Signed)
Left message for call back.

## 2016-09-27 NOTE — Telephone Encounter (Signed)
Patient would like BREO.  Patient states she has been without inhaler for a couple of days.

## 2016-09-28 ENCOUNTER — Encounter

## 2016-09-28 MED ORDER — FLUTICASONE 200 MCG-VILANTEROL 25 MCG/DOSE BREATH ACTIVATED INHALER
200-25 mcg/dose | Freq: Every day | RESPIRATORY_TRACT | 5 refills | Status: DC
Start: 2016-09-28 — End: 2017-04-09

## 2016-09-28 NOTE — Telephone Encounter (Signed)
Done, pt is aware.

## 2016-10-06 ENCOUNTER — Ambulatory Visit
Admission: RE | Admit: 2016-10-06 | Discharge: 2016-10-06 | Disposition: A | Discharge status: Home / Self Care | Payer: Medicare PPO | Source: Ambulatory Visit | Attending: Internal Medicine | Admitting: Internal Medicine

## 2016-10-06 ENCOUNTER — Other Ambulatory Visit: Payer: Self-pay | Admitting: Neurological Surgery

## 2016-10-06 DIAGNOSIS — Z1231 Encounter for screening mammogram for malignant neoplasm of breast: Secondary | ICD-10-CM

## 2016-10-18 ENCOUNTER — Encounter

## 2016-10-23 ENCOUNTER — Encounter

## 2016-10-24 MED ORDER — TRIAMTERENE-HYDROCHLOROTHIAZIDE 37.5 MG-25 MG CAP
ORAL_CAPSULE | ORAL | 1 refills | Status: DC
Start: 2016-10-24 — End: 2017-04-17

## 2016-10-27 NOTE — Pre-Procedure Instructions (Signed)
Margaret Gordon  10/27/2016      [id number] - [place] Otto, [place] - [address] AT [facility] [address] [place] [address] Phone: [phone] Fax: [phone]    Your procedure is scheduled on Aug 30  Report to [facility] Admitting at 530 A.M.  Call this number if you have problems the morning of surgery:  [phone]  For other questions call [phone] [facility].- Fri. 8am- 4pm   Remember:  Do not eat food or drink liquids after midnight.  Take these medicines the morning of surgery with A SIP OF WATER    Stop taking BC's, Goody's, vitamins, Herbal medications, Ibuprofen, Advil, Motrin, Aleve  Stop taking aspirin as directed by your Dr.    Drucilla Schmidt not wear jewelry, make-up or nail polish.  Do not wear lotions, powders, or perfumes, or deoderant.  Do not shave 48 hours prior to surgery.  Men may shave face and neck.  Do not bring valuables to the hospital.  [facility] is not responsible for any belongings or valuables.  Contacts, dentures or bridgework may not be worn into surgery.  Leave your suitcase in the car.  After surgery it may be brought to your room.  For patients admitted to the hospital, discharge time will be determined by your treatment team.  Patients discharged the day of surgery will not be allowed to drive home.    Special instructions:  [facility] - Preparing for Surgery  Before surgery, you can play an important role.  Because skin is not sterile, your skin needs to be as free of germs as possible.  You can reduce the number of germs on you skin by washing with CHG (chlorahexidine gluconate) soap before surgery.  CHG is an antiseptic cleaner which kills germs and bonds with the skin to continue killing germs even after washing.  Please DO NOT use if you have an allergy to CHG or antibacterial soaps.  If your skin becomes reddened/irritated stop using the CHG and inform your nurse when you  arrive at Short Stay.  Do not shave (including legs and underarms) for at least 48 hours prior to the first CHG shower.  You may shave your face.  Please follow these instructions carefully:   1.  Shower with CHG Soap the night before surgery and the  morning of Surgery.  2.  If you choose to wash your hair, wash your hair first as usual with your  normal shampoo.  3.  After you shampoo, rinse your hair and body thoroughly to remove the Shampoo.  4.  Use CHG as you would any other liquid soap.  You can apply chg directly to the skin and wash gently with scrungie or a clean washcloth.  5.  Apply the CHG Soap to your body ONLY FROM THE NECK DOWN.   Do not use on open wounds or open sores.  Avoid contact with your eyes,       ears, mouth and genitals (private parts).  Wash genitals (private parts) with your normal soap.  6.  Wash thoroughly, paying special attention to the area where your surgery will be performed.  7.  Thoroughly rinse your body with warm water from the neck down.  8.  DO NOT shower/wash with your normal soap after using and rinsing off the CHG Soap.  9.  Pat yourself dry with a clean towel.  10.  Wear clean pajamas.            11.  Place clean sheets on your bed the night of your first shower and do not sleep with pets.  Day of Surgery  Do not apply any lotions/deoderants the morning of surgery.  Please wear clean clothes to the hospital/surgery center.     Please read over the following fact sheets that you were given. Pain Booklet, Coughing and Deep Breathing, MRSA Information and Surgical Site Infection Prevention

## 2016-10-30 ENCOUNTER — Ambulatory Visit (HOSPITAL_COMMUNITY)
Admission: RE | Admit: 2016-10-30 | Discharge: 2016-10-30 | Disposition: A | Discharge status: Home / Self Care | Payer: Medicare PPO | Source: Ambulatory Visit | Attending: Neurological Surgery | Admitting: Neurological Surgery

## 2016-10-30 ENCOUNTER — Encounter (HOSPITAL_COMMUNITY)
Admission: RE | Admit: 2016-10-30 | Discharge: 2016-10-30 | Disposition: A | Discharge status: Home / Self Care | Payer: Medicare PPO | Source: Ambulatory Visit | Attending: Neurological Surgery | Admitting: Neurological Surgery

## 2016-10-30 ENCOUNTER — Encounter (HOSPITAL_COMMUNITY): Payer: Self-pay

## 2016-10-30 DIAGNOSIS — Z01810 Encounter for preprocedural cardiovascular examination: Secondary | ICD-10-CM | POA: Insufficient documentation

## 2016-10-30 DIAGNOSIS — Z01812 Encounter for preprocedural laboratory examination: Secondary | ICD-10-CM | POA: Diagnosis not present

## 2016-10-30 DIAGNOSIS — M48061 Spinal stenosis, lumbar region without neurogenic claudication: Secondary | ICD-10-CM | POA: Diagnosis not present

## 2016-10-30 HISTORY — DX: Pneumonia, unspecified organism: J18.9

## 2016-10-30 HISTORY — DX: Personal history of other diseases of the digestive system: Z87.19

## 2016-10-30 HISTORY — DX: Anemia, unspecified: D64.9

## 2016-10-30 HISTORY — DX: Unspecified osteoarthritis, unspecified site: M19.90

## 2016-10-30 HISTORY — DX: Gastro-esophageal reflux disease without esophagitis: K21.9

## 2016-10-30 LAB — CBC WITH DIFFERENTIAL/PLATELET
Basophils Absolute: 0 10*3/uL (ref 0.0–0.1)
Basophils Relative: 0 %
Eosinophils Absolute: 0.2 10*3/uL (ref 0.0–0.7)
Eosinophils Relative: 3 %
HCT: 38.9 % (ref 36.0–46.0)
Hemoglobin: 11.7 g/dL — ABNORMAL LOW (ref 12.0–15.0)
Lymphocytes Relative: 23 %
Lymphs Abs: 1.5 10*3/uL (ref 0.7–4.0)
MCH: 22.8 pg — ABNORMAL LOW (ref 26.0–34.0)
MCHC: 30.1 g/dL (ref 30.0–36.0)
MCV: 75.8 fL — ABNORMAL LOW (ref 78.0–100.0)
Monocytes Absolute: 0.4 10*3/uL (ref 0.1–1.0)
Monocytes Relative: 6 %
Neutro Abs: 4.7 10*3/uL (ref 1.7–7.7)
Neutrophils Relative %: 68 %
Platelets: 172 10*3/uL (ref 150–400)
RBC: 5.13 MIL/uL — ABNORMAL HIGH (ref 3.87–5.11)
RDW: 14.8 % (ref 11.5–15.5)
WBC: 6.7 10*3/uL (ref 4.0–10.5)

## 2016-10-30 LAB — BASIC METABOLIC PANEL
Anion gap: 6 (ref 5–15)
BUN: 21 mg/dL — ABNORMAL HIGH (ref 6–20)
CO2: 30 mmol/L (ref 22–32)
Calcium: 9.6 mg/dL (ref 8.9–10.3)
Chloride: 106 mmol/L (ref 101–111)
Creatinine, Ser: 0.96 mg/dL (ref 0.44–1.00)
GFR calc Af Amer: >60 mL/min (ref >60)
GFR calc non Af Amer: 57 mL/min — ABNORMAL LOW (ref >60)
Glucose, Bld: 101 mg/dL — ABNORMAL HIGH (ref 65–99)
Potassium: 4 mmol/L (ref 3.5–5.1)
Sodium: 142 mmol/L (ref 135–145)

## 2016-10-30 LAB — HEMOGLOBIN A1C
Hgb A1c MFr Bld: 5.6 % (ref 4.8–5.6)
Mean Plasma Glucose: 114.02 mg/dL

## 2016-10-30 LAB — PROTIME-INR
INR: 1.06
Prothrombin Time: 13.9 seconds (ref 11.4–15.2)

## 2016-10-30 LAB — SURGICAL PCR SCREEN
MRSA, PCR: NEGATIVE
Staphylococcus aureus: NEGATIVE

## 2016-10-30 NOTE — Pre-Procedure Instructions (Addendum)
Margaret Gordon  10/30/2016      [facility] - Margaret Gordon, [place] - [address] AT [facility] [address] [place] [address] Phone: [phone] Fax: [phone]    Your procedure is scheduled on Aug 30  Report to [facility] Admitting at 530 A.M.  Call this number if you have problems the morning of surgery:  [phone]  For other questions call [phone] [facility].- Fri. 8am- 4pm   Remember:  Do not eat food or drink liquids after midnight.  Take these medicines the morning of surgery with A SIP OF WATER      Pantoprazole(protonix),hydrocodone if needed  Stop taking BC's, Goody's, all vitamins, Herbal medications, Ibuprofen, Advil, Motrin, Aleve,aspirin,meloxicam              Do not wear jewelry, make-up or nail polish.  Do not wear lotions, powders, or perfumes, or deoderant.  Do not shave 48 hours prior to surgery.  Men may shave face and neck.  Do not bring valuables to the hospital.  [facility] is not responsible for any belongings or valuables.  Contacts, dentures or bridgework may not be worn into surgery.  Leave your suitcase in the car.  After surgery it may be brought to your room.  For patients admitted to the hospital, discharge time will be determined by your treatment team.  Patients discharged the day of surgery will not be allowed to drive home.    Special instructions:  Margaret Gordon - Preparing for Surgery  Before surgery, you can play an important role.  Because skin is not sterile, your skin needs to be as free of germs as possible.  You can reduce the number of germs on you skin by washing with CHG (chlorahexidine gluconate) soap before surgery.  CHG is an antiseptic cleaner which kills germs and bonds with the skin to continue killing germs even after washing.  Please DO NOT use if you have an allergy to CHG or antibacterial soaps.  If your skin becomes reddened/irritated stop using the CHG  and inform your nurse when you arrive at Short Stay.  Do not shave (including legs and underarms) for at least 48 hours prior to the first CHG shower.  You may shave your face.  Please follow these instructions carefully:   1.  Shower with CHG Soap the night before surgery and the  morning of Surgery.  2.  If you choose to wash your hair, wash your hair first as usual with your  normal shampoo.  3.  After you shampoo, rinse your hair and body thoroughly to remove the Shampoo.  4.  Use CHG as you would any other liquid soap.  You can apply chg directly to the skin and wash gently with scrungie or a clean washcloth.  5.  Apply the CHG Soap to your body ONLY FROM THE NECK DOWN.   Do not use on open wounds or open sores.  Avoid contact with your eyes,       ears, mouth and genitals (private parts).  Wash genitals (private parts) with your normal soap.  6.  Wash thoroughly, paying special attention to the area where your surgery will be performed.  7.  Thoroughly rinse your body with warm water from the neck down.  8.  DO NOT shower/wash with your normal soap after using and rinsing off the CHG Soap.  9.  Pat yourself dry with a  clean towel.            10.  Wear clean pajamas.            11.  Place clean sheets on your bed the night of your first shower and do not sleep with pets.  Day of Surgery  Do not apply any lotions/deoderants the morning of surgery.  Please wear clean clothes to the hospital/surgery center.     Please read over the following fact sheets that you were given. Pain Booklet, Coughing and Deep Breathing, MRSA Information and Surgical Site Infection Prevention

## 2016-11-02 ENCOUNTER — Encounter (HOSPITAL_COMMUNITY)
Admission: RE | Disposition: A | Discharge status: Home / Self Care | Payer: Self-pay | Source: Ambulatory Visit | Attending: Neurological Surgery

## 2016-11-02 ENCOUNTER — Inpatient Hospital Stay (HOSPITAL_COMMUNITY): Payer: Medicare PPO | Admitting: Emergency Medicine

## 2016-11-02 ENCOUNTER — Inpatient Hospital Stay (HOSPITAL_COMMUNITY): Payer: Medicare PPO

## 2016-11-02 ENCOUNTER — Encounter (HOSPITAL_COMMUNITY): Payer: Self-pay | Admitting: *Deleted

## 2016-11-02 ENCOUNTER — Observation Stay (HOSPITAL_COMMUNITY)
Admission: RE | Admit: 2016-11-02 | Discharge: 2016-11-03 | Disposition: A | Discharge status: Home / Self Care | Payer: Medicare PPO | Source: Ambulatory Visit | Attending: Neurological Surgery | Admitting: Neurological Surgery

## 2016-11-02 ENCOUNTER — Inpatient Hospital Stay (HOSPITAL_COMMUNITY): Payer: Medicare PPO | Admitting: Certified Registered Nurse Anesthetist

## 2016-11-02 DIAGNOSIS — M1990 Unspecified osteoarthritis, unspecified site: Secondary | ICD-10-CM | POA: Insufficient documentation

## 2016-11-02 DIAGNOSIS — I119 Hypertensive heart disease without heart failure: Secondary | ICD-10-CM | POA: Insufficient documentation

## 2016-11-02 DIAGNOSIS — K219 Gastro-esophageal reflux disease without esophagitis: Secondary | ICD-10-CM | POA: Diagnosis not present

## 2016-11-02 DIAGNOSIS — Z79899 Other long term (current) drug therapy: Secondary | ICD-10-CM | POA: Insufficient documentation

## 2016-11-02 DIAGNOSIS — M48061 Spinal stenosis, lumbar region without neurogenic claudication: Principal | ICD-10-CM | POA: Insufficient documentation

## 2016-11-02 DIAGNOSIS — K449 Diaphragmatic hernia without obstruction or gangrene: Secondary | ICD-10-CM | POA: Diagnosis not present

## 2016-11-02 DIAGNOSIS — E785 Hyperlipidemia, unspecified: Secondary | ICD-10-CM | POA: Insufficient documentation

## 2016-11-02 DIAGNOSIS — Z7982 Long term (current) use of aspirin: Secondary | ICD-10-CM | POA: Diagnosis not present

## 2016-11-02 DIAGNOSIS — M4316 Spondylolisthesis, lumbar region: Secondary | ICD-10-CM | POA: Insufficient documentation

## 2016-11-02 DIAGNOSIS — Z9889 Other specified postprocedural states: Secondary | ICD-10-CM

## 2016-11-02 DIAGNOSIS — Z419 Encounter for procedure for purposes other than remedying health state, unspecified: Secondary | ICD-10-CM

## 2016-11-02 HISTORY — PX: LUMBAR LAMINECTOMY/DECOMPRESSION MICRODISCECTOMY: SHX5026

## 2016-11-02 SURGERY — LUMBAR LAMINECTOMY/DECOMPRESSION MICRODISCECTOMY 2 LEVELS
Anesthesia: General | Site: Spine Lumbar | Laterality: Left

## 2016-11-02 MED ORDER — CEFAZOLIN SODIUM-DEXTROSE 2-4 GM/100ML-% IV SOLN
2.0000 g | INTRAVENOUS | Status: AC
  Administered 2016-11-02: 2 g via INTRAVENOUS

## 2016-11-02 MED ORDER — ONDANSETRON HCL 4 MG/2ML IJ SOLN
INTRAMUSCULAR | Status: AC
  Filled 2016-11-02: qty 2

## 2016-11-02 MED ORDER — DEXAMETHASONE SODIUM PHOSPHATE 10 MG/ML IJ SOLN: 10.0000 mg | INTRAMUSCULAR | Status: DC

## 2016-11-02 MED ORDER — POTASSIUM CHLORIDE IN NACL 20-0.9 MEQ/L-% IV SOLN: INTRAVENOUS | Status: DC

## 2016-11-02 MED ORDER — BUPIVACAINE HCL 0.5 % IJ SOLN
INTRAMUSCULAR | Status: DC | PRN
  Administered 2016-11-02: 5 mL

## 2016-11-02 MED ORDER — PROMETHAZINE HCL 25 MG/ML IJ SOLN: 6.2500 mg | INTRAMUSCULAR | Status: DC | PRN

## 2016-11-02 MED ORDER — MORPHINE SULFATE (PF) 4 MG/ML IV SOLN: 2.0000 mg | INTRAVENOUS | Status: DC | PRN

## 2016-11-02 MED ORDER — CHLORHEXIDINE GLUCONATE CLOTH 2 % EX PADS: 6.0000 | MEDICATED_PAD | Freq: Once | CUTANEOUS | Status: DC

## 2016-11-02 MED ORDER — ROCURONIUM BROMIDE 10 MG/ML (PF) SYRINGE
PREFILLED_SYRINGE | INTRAVENOUS | Status: AC
  Filled 2016-11-02: qty 5

## 2016-11-02 MED ORDER — HYDROMORPHONE HCL 1 MG/ML IJ SOLN
0.2500 mg | INTRAMUSCULAR | Status: DC | PRN
  Administered 2016-11-02: 0.5 mg via INTRAVENOUS

## 2016-11-02 MED ORDER — SUGAMMADEX SODIUM 200 MG/2ML IV SOLN
INTRAVENOUS | Status: DC | PRN
  Administered 2016-11-02: 200 mg via INTRAVENOUS

## 2016-11-02 MED ORDER — 0.9 % SODIUM CHLORIDE (POUR BTL) OPTIME
TOPICAL | Status: DC | PRN
Start: 2016-11-02 — End: 2016-11-02
  Administered 2016-11-02: 1000 mL

## 2016-11-02 MED ORDER — ONDANSETRON HCL 4 MG/2ML IJ SOLN: 4.0000 mg | Freq: Four times a day (QID) | INTRAMUSCULAR | Status: DC | PRN

## 2016-11-02 MED ORDER — CEFAZOLIN SODIUM-DEXTROSE 2-4 GM/100ML-% IV SOLN
2.0000 g | Freq: Three times a day (TID) | INTRAVENOUS | Status: AC
  Administered 2016-11-02 (×2): 2 g via INTRAVENOUS
  Filled 2016-11-02 (×2): qty 100

## 2016-11-02 MED ORDER — DEXAMETHASONE SODIUM PHOSPHATE 10 MG/ML IJ SOLN
INTRAMUSCULAR | Status: DC | PRN
  Administered 2016-11-02: 10 mg via INTRAVENOUS

## 2016-11-02 MED ORDER — ACETAMINOPHEN 650 MG RE SUPP: 650.0000 mg | RECTAL | Status: DC | PRN

## 2016-11-02 MED ORDER — METHOCARBAMOL 1000 MG/10ML IJ SOLN: 500.0000 mg | Freq: Four times a day (QID) | INTRAVENOUS | Status: DC | PRN

## 2016-11-02 MED ORDER — FENTANYL CITRATE (PF) 250 MCG/5ML IJ SOLN
INTRAMUSCULAR | Status: AC
  Filled 2016-11-02: qty 5

## 2016-11-02 MED ORDER — SODIUM CHLORIDE 0.9% FLUSH
3.0000 mL | Freq: Two times a day (BID) | INTRAVENOUS | Status: DC
  Administered 2016-11-02: 3 mL via INTRAVENOUS

## 2016-11-02 MED ORDER — SODIUM CHLORIDE 0.9% FLUSH: 3.0000 mL | INTRAVENOUS | Status: DC | PRN

## 2016-11-02 MED ORDER — BUPIVACAINE HCL (PF) 0.5 % IJ SOLN
INTRAMUSCULAR | Status: AC
  Filled 2016-11-02: qty 30

## 2016-11-02 MED ORDER — MIDAZOLAM HCL 2 MG/2ML IJ SOLN
INTRAMUSCULAR | Status: AC
  Filled 2016-11-02: qty 2

## 2016-11-02 MED ORDER — HEMOSTATIC AGENTS (NO CHARGE) OPTIME
TOPICAL | Status: DC | PRN
  Administered 2016-11-02: 1 via TOPICAL

## 2016-11-02 MED ORDER — LIDOCAINE 2% (20 MG/ML) 5 ML SYRINGE
INTRAMUSCULAR | Status: DC | PRN
  Administered 2016-11-02: 60 mg via INTRAVENOUS

## 2016-11-02 MED ORDER — THROMBIN 5000 UNITS EX SOLR
CUTANEOUS | Status: AC
  Filled 2016-11-02: qty 15000

## 2016-11-02 MED ORDER — PHENYLEPHRINE 40 MCG/ML (10ML) SYRINGE FOR IV PUSH (FOR BLOOD PRESSURE SUPPORT)
PREFILLED_SYRINGE | INTRAVENOUS | Status: AC
Start: 2016-11-02 — End: 2016-11-02
  Filled 2016-11-02: qty 10

## 2016-11-02 MED ORDER — SENNA 8.6 MG PO TABS
1.0000 | ORAL_TABLET | Freq: Two times a day (BID) | ORAL | Status: DC
  Administered 2016-11-02 – 2016-11-03 (×3): 8.6 mg via ORAL
  Filled 2016-11-02 (×3): qty 1

## 2016-11-02 MED ORDER — ASPIRIN EC 81 MG PO TBEC
81.0000 mg | DELAYED_RELEASE_TABLET | Freq: Every day | ORAL | Status: DC
  Administered 2016-11-03: 81 mg via ORAL
  Filled 2016-11-02: qty 1

## 2016-11-02 MED ORDER — DEXAMETHASONE SODIUM PHOSPHATE 10 MG/ML IJ SOLN
INTRAMUSCULAR | Status: AC
  Filled 2016-11-02: qty 1

## 2016-11-02 MED ORDER — ONDANSETRON HCL 4 MG PO TABS: 4.0000 mg | ORAL_TABLET | Freq: Four times a day (QID) | ORAL | Status: DC | PRN

## 2016-11-02 MED ORDER — ONDANSETRON HCL 4 MG/2ML IJ SOLN
INTRAMUSCULAR | Status: DC | PRN
  Administered 2016-11-02: 4 mg via INTRAVENOUS

## 2016-11-02 MED ORDER — PROPOFOL 10 MG/ML IV BOLUS
INTRAVENOUS | Status: AC
  Filled 2016-11-02: qty 20

## 2016-11-02 MED ORDER — SODIUM CHLORIDE 0.9 % IR SOLN
Status: DC | PRN
  Administered 2016-11-02: 08:00:00

## 2016-11-02 MED ORDER — MENTHOL 3 MG MT LOZG: 1.0000 | LOZENGE | OROMUCOSAL | Status: DC | PRN

## 2016-11-02 MED ORDER — MIDAZOLAM HCL 2 MG/2ML IJ SOLN
INTRAMUSCULAR | Status: DC | PRN
  Administered 2016-11-02: 1 mg via INTRAVENOUS

## 2016-11-02 MED ORDER — LACTATED RINGERS IV SOLN
INTRAVENOUS | Status: DC | PRN
  Administered 2016-11-02 (×2): via INTRAVENOUS

## 2016-11-02 MED ORDER — LOSARTAN POTASSIUM 50 MG PO TABS
50.0000 mg | ORAL_TABLET | Freq: Every day | ORAL | Status: DC
  Administered 2016-11-03: 50 mg via ORAL
  Filled 2016-11-02: qty 1

## 2016-11-02 MED ORDER — SUGAMMADEX SODIUM 200 MG/2ML IV SOLN
INTRAVENOUS | Status: AC
  Filled 2016-11-02: qty 2

## 2016-11-02 MED ORDER — HYDROCODONE-ACETAMINOPHEN 7.5-325 MG PO TABS
1.0000 | ORAL_TABLET | ORAL | Status: DC | PRN
  Administered 2016-11-02: 1 via ORAL
  Administered 2016-11-03: 2 via ORAL
  Administered 2016-11-03: 1 via ORAL
  Filled 2016-11-02: qty 1
  Filled 2016-11-02: qty 2
  Filled 2016-11-02: qty 1

## 2016-11-02 MED ORDER — METHOCARBAMOL 500 MG PO TABS
ORAL_TABLET | ORAL | Status: AC
  Filled 2016-11-02: qty 1

## 2016-11-02 MED ORDER — FENTANYL CITRATE (PF) 250 MCG/5ML IJ SOLN
INTRAMUSCULAR | Status: DC | PRN
  Administered 2016-11-02: 100 ug via INTRAVENOUS

## 2016-11-02 MED ORDER — LIDOCAINE 2% (20 MG/ML) 5 ML SYRINGE
INTRAMUSCULAR | Status: AC
  Filled 2016-11-02: qty 5

## 2016-11-02 MED ORDER — MELOXICAM 7.5 MG PO TABS
15.0000 mg | ORAL_TABLET | Freq: Every day | ORAL | Status: DC
Start: 2016-11-02 — End: 2016-11-03
  Administered 2016-11-02 – 2016-11-03 (×2): 15 mg via ORAL
  Filled 2016-11-02 (×2): qty 2

## 2016-11-02 MED ORDER — PROPOFOL 10 MG/ML IV BOLUS
INTRAVENOUS | Status: DC | PRN
  Administered 2016-11-02: 150 mg via INTRAVENOUS

## 2016-11-02 MED ORDER — EPHEDRINE 5 MG/ML INJ
INTRAVENOUS | Status: AC
Start: 2016-11-02 — End: 2016-11-02
  Filled 2016-11-02: qty 10

## 2016-11-02 MED ORDER — ROCURONIUM BROMIDE 10 MG/ML (PF) SYRINGE
PREFILLED_SYRINGE | INTRAVENOUS | Status: DC | PRN
  Administered 2016-11-02: 60 mg via INTRAVENOUS

## 2016-11-02 MED ORDER — HYDROCODONE-ACETAMINOPHEN 7.5-325 MG PO TABS
1.0000 | ORAL_TABLET | Freq: Four times a day (QID) | ORAL | Status: DC
  Administered 2016-11-02 (×2): 1 via ORAL
  Filled 2016-11-02 (×2): qty 1

## 2016-11-02 MED ORDER — PHENOL 1.4 % MT LIQD: 1.0000 | OROMUCOSAL | Status: DC | PRN

## 2016-11-02 MED ORDER — CEFAZOLIN SODIUM-DEXTROSE 2-4 GM/100ML-% IV SOLN
INTRAVENOUS | Status: AC
  Filled 2016-11-02: qty 100

## 2016-11-02 MED ORDER — DEXAMETHASONE SODIUM PHOSPHATE 10 MG/ML IJ SOLN
INTRAMUSCULAR | Status: AC
Start: 2016-11-02 — End: 2016-11-02
  Filled 2016-11-02: qty 1

## 2016-11-02 MED ORDER — THROMBIN 5000 UNITS EX SOLR
CUTANEOUS | Status: DC | PRN
  Administered 2016-11-02 (×2): 5000 [IU] via TOPICAL

## 2016-11-02 MED ORDER — THROMBIN 5000 UNITS EX SOLR
OROMUCOSAL | Status: DC | PRN
  Administered 2016-11-02: 08:00:00 via TOPICAL

## 2016-11-02 MED ORDER — METHOCARBAMOL 500 MG PO TABS
500.0000 mg | ORAL_TABLET | Freq: Four times a day (QID) | ORAL | Status: DC | PRN
Start: 2016-11-02 — End: 2016-11-03
  Administered 2016-11-02 – 2016-11-03 (×4): 500 mg via ORAL
  Filled 2016-11-02 (×3): qty 1

## 2016-11-02 MED ORDER — PANTOPRAZOLE SODIUM 40 MG PO TBEC
40.0000 mg | DELAYED_RELEASE_TABLET | Freq: Every day | ORAL | Status: DC
  Administered 2016-11-02 – 2016-11-03 (×2): 40 mg via ORAL
  Filled 2016-11-02 (×2): qty 1

## 2016-11-02 MED ORDER — SUCCINYLCHOLINE CHLORIDE 200 MG/10ML IV SOSY
PREFILLED_SYRINGE | INTRAVENOUS | Status: AC
  Filled 2016-11-02: qty 10

## 2016-11-02 MED ORDER — ACETAMINOPHEN 325 MG PO TABS: 650.0000 mg | ORAL_TABLET | ORAL | Status: DC | PRN

## 2016-11-02 MED ORDER — HYDROMORPHONE HCL 1 MG/ML IJ SOLN
INTRAMUSCULAR | Status: AC
  Filled 2016-11-02: qty 1

## 2016-11-02 SURGICAL SUPPLY — 49 items
APL SKNCLS STERI-STRIP NONHPOA (GAUZE/BANDAGES/DRESSINGS) ×1
BAG DECANTER FOR FLEXI CONT (MISCELLANEOUS) ×2 IMPLANT
BENZOIN TINCTURE PRP APPL 2/3 (GAUZE/BANDAGES/DRESSINGS) ×2 IMPLANT
BUR MATCHSTICK NEURO 3.0 LAGG (BURR) ×2 IMPLANT
CANISTER SUCT 3000ML PPV (MISCELLANEOUS) ×2 IMPLANT
CARTRIDGE OIL MAESTRO DRILL (MISCELLANEOUS) ×1 IMPLANT
DIFFUSER DRILL AIR PNEUMATIC (MISCELLANEOUS) ×2 IMPLANT
DRAPE LAPAROTOMY 100X72X124 (DRAPES) ×2 IMPLANT
DRAPE MICROSCOPE LEICA (MISCELLANEOUS) ×2 IMPLANT
DRAPE POUCH INSTRU U-SHP 10X18 (DRAPES) ×2 IMPLANT
DRAPE SURG 17X23 STRL (DRAPES) ×2 IMPLANT
DRSG OPSITE POSTOP 4X6 (GAUZE/BANDAGES/DRESSINGS) ×1 IMPLANT
DURAPREP 26ML APPLICATOR (WOUND CARE) ×2 IMPLANT
ELECT REM PT RETURN 9FT ADLT (ELECTROSURGICAL) ×2
ELECTRODE REM PT RTRN 9FT ADLT (ELECTROSURGICAL) ×1 IMPLANT
GAUZE SPONGE 4X4 16PLY XRAY LF (GAUZE/BANDAGES/DRESSINGS) IMPLANT
GLOVE BIO SURGEON STRL SZ7 (GLOVE) ×1 IMPLANT
GLOVE BIO SURGEON STRL SZ8 (GLOVE) ×2 IMPLANT
GLOVE BIOGEL PI IND STRL 7.0 (GLOVE) IMPLANT
GLOVE BIOGEL PI IND STRL 7.5 (GLOVE) IMPLANT
GLOVE BIOGEL PI INDICATOR 7.0 (GLOVE) ×1
GLOVE BIOGEL PI INDICATOR 7.5 (GLOVE) ×3
GLOVE SS N UNI LF 7.0 STRL (GLOVE) ×2 IMPLANT
GOWN STRL REUS W/ TWL LRG LVL3 (GOWN DISPOSABLE) IMPLANT
GOWN STRL REUS W/ TWL XL LVL3 (GOWN DISPOSABLE) ×1 IMPLANT
GOWN STRL REUS W/TWL 2XL LVL3 (GOWN DISPOSABLE) IMPLANT
GOWN STRL REUS W/TWL LRG LVL3 (GOWN DISPOSABLE) ×4
GOWN STRL REUS W/TWL XL LVL3 (GOWN DISPOSABLE) ×4
HEMOSTAT POWDER KIT SURGIFOAM (HEMOSTASIS) IMPLANT
KIT BASIN OR (CUSTOM PROCEDURE TRAY) ×2 IMPLANT
KIT ROOM TURNOVER OR (KITS) ×2 IMPLANT
NDL HYPO 25X1 1.5 SAFETY (NEEDLE) ×1 IMPLANT
NDL SPNL 20GX3.5 QUINCKE YW (NEEDLE) IMPLANT
NEEDLE HYPO 25X1 1.5 SAFETY (NEEDLE) ×2 IMPLANT
NEEDLE SPNL 20GX3.5 QUINCKE YW (NEEDLE) IMPLANT
NS IRRIG 1000ML POUR BTL (IV SOLUTION) ×2 IMPLANT
OIL CARTRIDGE MAESTRO DRILL (MISCELLANEOUS) ×2
PACK LAMINECTOMY NEURO (CUSTOM PROCEDURE TRAY) ×2 IMPLANT
PAD ARMBOARD 7.5X6 YLW CONV (MISCELLANEOUS) ×6 IMPLANT
RUBBERBAND STERILE (MISCELLANEOUS) ×4 IMPLANT
SPONGE SURGIFOAM ABS GEL SZ50 (HEMOSTASIS) ×2 IMPLANT
STRIP CLOSURE SKIN 1/2X4 (GAUZE/BANDAGES/DRESSINGS) ×2 IMPLANT
SUT VIC AB 0 CT1 18XCR BRD8 (SUTURE) ×1 IMPLANT
SUT VIC AB 0 CT1 8-18 (SUTURE) ×2
SUT VIC AB 2-0 CP2 18 (SUTURE) ×2 IMPLANT
SUT VIC AB 3-0 SH 8-18 (SUTURE) ×3 IMPLANT
TOWEL GREEN STERILE (TOWEL DISPOSABLE) ×2 IMPLANT
TOWEL GREEN STERILE FF (TOWEL DISPOSABLE) ×2 IMPLANT
WATER STERILE IRR 1000ML POUR (IV SOLUTION) ×2 IMPLANT

## 2016-11-02 NOTE — Transfer of Care (Signed)
Immediate Anesthesia Transfer of Care Note  Patient: Margaret Gordon  Procedure(s) Performed: Procedure(s): Left Lumbar Three-Four, Lumbar Four-Five Laminectomy and Foraminotomy (Left)  Patient Location: PACU  Anesthesia Type:General  Level of Consciousness: drowsy and patient cooperative  Airway & Oxygen Therapy: Patient Spontanous Breathing  Post-op Assessment: Report given to RN, Post -op Vital signs reviewed and stable and Patient moving all extremities X 4  Post vital signs: Reviewed and stable  Last Vitals:  Vitals:   11/02/16 0636 11/02/16 0936  BP: (!) 170/87 (!) 148/85  Pulse: 65 76  Resp: 18 20  Temp: 36.6 C (!) 36.1 C  SpO2: 100% 97%    Last Pain:  Vitals:   11/02/16 0936  TempSrc:   PainSc: 0-No pain      Patients Stated Pain Goal: 8 (11/02/16 0549)  Complications: No apparent anesthesia complications

## 2016-11-02 NOTE — Anesthesia Procedure Notes (Signed)
Procedure Name: Intubation Date/Time: 11/02/2016 7:46 AM Performed by: Julieta Bellini Pre-anesthesia Checklist: Patient identified, Emergency Drugs available, Suction available and Patient being monitored Patient Re-evaluated:Patient Re-evaluated prior to induction Oxygen Delivery Method: Circle system utilized Preoxygenation: Pre-oxygenation with 100% oxygen Induction Type: IV induction Ventilation: Oral airway inserted - appropriate to patient size and Mask ventilation without difficulty Laryngoscope Size: Mac and 3 Grade View: Grade I Tube type: Oral Tube size: 7.0 mm Number of attempts: 1 Airway Equipment and Method: Stylet Placement Confirmation: ETT inserted through vocal cords under direct vision,  positive ETCO2 and breath sounds checked- equal and bilateral Secured at: 22 cm Tube secured with: Tape Dental Injury: Teeth and Oropharynx as per pre-operative assessment

## 2016-11-02 NOTE — H&P (Signed)
Subjective: Patient is a 76 y.o. female admitted for back and leg pain. Onset of symptoms was several months ago, gradually worsening since that time.  The pain is rated severe, and is located at the across the lower back and radiates to LLE. The pain is described as aching and occurs all day. The symptoms have been progressive. Symptoms are exacerbated by exercise. MRI or CT showed stenosis   Past Medical History:  Diagnosis Date  . Anemia    hx  . Arthritis   . GERD (gastroesophageal reflux disease)   . History of hiatal hernia   . Hyperlipidemia   . Hypertension   . Pneumonia    hx    Past Surgical History:  Procedure Laterality Date  . TONSILLECTOMY    . VESICOVAGINAL FISTULA CLOSURE W/ TAH      Prior to Admission medications   Medication Sig Start Date End Date Taking? Authorizing Provider  aspirin 81 MG tablet Take 81 mg by mouth daily.   Yes [provider]  atorvastatin (LIPITOR) 10 MG tablet Take 10 mg by mouth daily.   Yes [provider]  cholecalciferol (VITAMIN D) 1000 UNITS tablet Take 1,000 Units by mouth daily.   Yes [provider]  famotidine (PEPCID) 20 MG tablet Take 20 mg by mouth at bedtime.   Yes [provider]  HYDROcodone-acetaminophen (NORCO/VICODIN) 5-325 MG per tablet Take 1 tablet by mouth 2 (two) times daily as needed for moderate pain.    Yes [provider]  ibuprofen (ADVIL,MOTRIN) 200 MG tablet Take 400-800 mg by mouth 2 (two) times daily as needed.   Yes [provider]  losartan (COZAAR) 50 MG tablet Take 50 mg by mouth daily.   Yes [provider]  meloxicam (MOBIC) 15 MG tablet Take 15 mg by mouth daily. 09/26/16  Yes [provider]  Multiple Vitamin (MULTIVITAMIN WITH MINERALS) TABS tablet Take 1 tablet by mouth at bedtime.   Yes [provider]  OVER THE COUNTER MEDICATION Take 1,300 mg by mouth every 6 (six) hours as needed. Tylenol arthritis   Yes [provider]  pantoprazole (PROTONIX) 40 MG tablet Take 1 tablet (40 mg total) by mouth daily. 02/14/13   Nyoka CowdenWert, Michael B, MD  Polyvinyl Alcohol-Povidone (REFRESH OP) Place 1 drop into both eyes 2 (two) times daily as needed (dry eyes).    [provider]   No Known Allergies  Social History  Substance Use Topics  . Smoking status: Never Smoker  . Smokeless tobacco: Former NeurosurgeonUser  . Alcohol use No    Family History  Problem Relation Age of Onset  . Breast cancer Mother      Review of Systems  Positive ROS: neg  All other systems have been reviewed and were otherwise negative with the exception of those mentioned in the HPI and as above.  Objective: Vital signs in last 24 hours: Temp:  [97.9 F (36.6 C)] 97.9 F (36.6 C) (08/30 0636) Pulse Rate:  [65] 65 (08/30 0636) Resp:  [18] 18 (08/30 0636) BP: (170)/(87) 170/87 (08/30 0636) SpO2:  [100 %] 100 % (08/30 0636)  General Appearance: Alert, cooperative, no distress, appears stated age Head: Normocephalic, without obvious abnormality, atraumatic Eyes: PERRL, conjunctiva/corneas clear, EOM's intact    Neck: Supple, symmetrical, trachea midline Back: Symmetric, no curvature, ROM normal, no CVA tenderness Lungs:  respirations unlabored Heart: Regular rate and rhythm Abdomen: Soft, non-tender Extremities: Extremities normal, atraumatic, no cyanosis or edema Pulses: 2+ and  symmetric all extremities Skin: Skin color, texture, turgor normal, no rashes or lesions  NEUROLOGIC:   Mental status: Alert and oriented x4,  no aphasia, good attention span, fund of knowledge, and memory Motor Exam - grossly normal Sensory Exam - grossly normal Reflexes: 1+ Coordination - grossly normal Gait - grossly normal Balance - grossly normal Cranial Nerves: I: smell Not tested  II: visual acuity  OS: nl    OD: nl  II: visual fields Full to confrontation  II: pupils Equal, round, reactive to light  III,VII: ptosis None   III,IV,VI: extraocular muscles  Full ROM  V: mastication Normal  V: facial light touch sensation  Normal  V,VII: corneal reflex  Present  VII: facial muscle function - upper  Normal  VII: facial muscle function - lower Normal  VIII: hearing Not tested  IX: soft palate elevation  Normal  IX,X: gag reflex Present  XI: trapezius strength  5/5  XI: sternocleidomastoid strength 5/5  XI: neck flexion strength  5/5  XII: tongue strength  Normal    Data Review Lab Results  Component Value Date   WBC 6.7 10/30/2016   HGB 11.7 (L) 10/30/2016   HCT 38.9 10/30/2016   MCV 75.8 (L) 10/30/2016   PLT 172 10/30/2016   Lab Results  Component Value Date   NA 142 10/30/2016   K 4.0 10/30/2016   CL 106 10/30/2016   CO2 30 10/30/2016   BUN 21 (H) 10/30/2016   CREATININE 0.96 10/30/2016   GLUCOSE 101 (H) 10/30/2016   Lab Results  Component Value Date   INR 1.06 10/30/2016    Assessment/Plan: Patient admitted for LL L3-4 L4-5. Patient has failed a reasonable attempt at conservative therapy.  I explained the condition and procedure to the patient and answered any questions.  Patient wishes to proceed with procedure as planned. Understands risks/ benefits and typical outcomes of procedure.   JONES,DAVID S 11/02/2016 7:31 AM

## 2016-11-02 NOTE — Op Note (Signed)
11/02/2016  9:39 AM  PATIENT:  Margaret Gordon  76 y.o. 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 Imaging showed stenosis with stable spondylolisthesis. The patient tried conservative measures without relief. Pain was debilitating. Recommended LL L3-4 L4-5 L. Patient understood the risks, benefits, and alternatives and potential outcomes and wished to proceed.  PROCEDURE DETAILS: The patient was taken to the operating room and after induction of adequate generalized endotracheal anesthesia, the patient was rolled into the prone position on the Wilson frame and all pressure points were padded. The lumbar region was cleaned and then prepped with DuraPrep and draped in the usual sterile fashion. 5 cc of local anesthesia was injected and then a dorsal midline incision was made and carried down to the lumbo sacral fascia. The fascia was opened and the paraspinous musculature was taken down in a subperiosteal fashion to expose L3-4, L4-5 on the left. Intraoperative x-ray confirmed my level, and then I used a combination of the high-speed drill and the Kerrison punches to perform a hemilaminectomy, medial facetectomy, and foraminotomy at L3-4 L4-5  on the left. The underlying yellow ligament was opened and removed in a piecemeal fashion to expose the underlying dura and exiting nerve root. I undercut the lateral recess and dissected down until I was medial to and distal to the pedicle. The nerve root was well decompressed. We then gently retracted the  nerve root medially with a retractor, coagulated the epidural venous vasculature, and inspected the disc space. I then palpated with a coronary dilator along the nerve root and into the foramen to assure adequate decompression. I felt no more compression of the nerve root. I irrigated with saline solution containing bacitracin. Achieved hemostasis with bipolar cautery, lined the dura with Gelfoam, and then closed the fascia with 0 Vicryl. I closed the subcutaneous tissues with 2-0 Vicryl and the subcuticular tissues with 3-0 Vicryl. The skin was then closed with benzoin and Steri-Strips. The drapes were removed, a sterile dressing was applied. The patient was awakened from general anesthesia and transferred to the recovery room in stable condition. At the end of the procedure all sponge, needle and instrument counts were correct.    PLAN OF CARE: Admit for overnight observation  PATIENT DISPOSITION:  PACU - hemodynamically stable.   Delay start of Pharmacological VTE agent (>24hrs) due to surgical blood loss or risk of bleeding:  yes

## 2016-11-02 NOTE — Anesthesia Preprocedure Evaluation (Signed)
Anesthesia Evaluation  Patient identified by MRN, date of birth, ID band Patient awake    Reviewed: Allergy & Precautions, NPO status , Patient's Chart, lab work & pertinent test results  Airway Mallampati: II  TM Distance: >3 FB Neck ROM: Full    Dental no notable dental hx.    Pulmonary neg pulmonary ROS,    Pulmonary exam normal breath sounds clear to auscultation       Cardiovascular hypertension, Normal cardiovascular exam Rhythm:Regular Rate:Normal     Neuro/Psych negative neurological ROS  negative psych ROS   GI/Hepatic Neg liver ROS, GERD  ,  Endo/Other  negative endocrine ROS  Renal/GU negative Renal ROS  negative genitourinary   Musculoskeletal negative musculoskeletal ROS (+)   Abdominal   Peds negative pediatric ROS (+)  Hematology negative hematology ROS (+)   Anesthesia Other Findings   Reproductive/Obstetrics negative OB ROS                             Anesthesia Physical Anesthesia Plan  ASA: II  Anesthesia Plan: General   Post-op Pain Management:    Induction: Intravenous  PONV Risk Score and Plan: 2 and Ondansetron and Dexamethasone  Airway Management Planned: Oral ETT  Additional Equipment:   Intra-op Plan:   Post-operative Plan: Extubation in OR  Informed Consent: I have reviewed the patients History and Physical, chart, labs and discussed the procedure including the risks, benefits and alternatives for the proposed anesthesia with the patient or authorized representative who has indicated his/her understanding and acceptance.   Dental advisory given  Plan Discussed with: CRNA and Surgeon  Anesthesia Plan Comments:         Anesthesia Quick Evaluation  

## 2016-11-02 NOTE — Anesthesia Postprocedure Evaluation (Signed)
Anesthesia Post Note  Patient: Roseanne RenoJoann W Pardi  Procedure(s) Performed: Procedure(s) (LRB): Left Lumbar Three-Four, Lumbar Four-Five Laminectomy and Foraminotomy (Left)     Patient location during evaluation: PACU Anesthesia Type: General Level of consciousness: awake and alert Pain management: pain level controlled Vital Signs Assessment: post-procedure vital signs reviewed and stable Respiratory status: spontaneous breathing, nonlabored ventilation, respiratory function stable and patient connected to nasal cannula oxygen Cardiovascular status: blood pressure returned to baseline and stable Postop Assessment: no signs of nausea or vomiting Anesthetic complications: no    Last Vitals:  Vitals:   11/02/16 1015 11/02/16 1030  BP: 132/80   Pulse: 67 63  Resp: 15 14  Temp:    SpO2: 100% 100%    Last Pain:  Vitals:   11/02/16 1030  TempSrc:   PainSc: 3                  ROSE,GEORGE S

## 2016-11-03 ENCOUNTER — Encounter (HOSPITAL_COMMUNITY): Payer: Self-pay | Admitting: Neurological Surgery

## 2016-11-03 DIAGNOSIS — M48061 Spinal stenosis, lumbar region without neurogenic claudication: Secondary | ICD-10-CM | POA: Diagnosis not present

## 2016-11-03 MED ORDER — HYDROCODONE-ACETAMINOPHEN 5-325 MG PO TABS: 1.0000 | ORAL_TABLET | ORAL | 0 refills | Status: AC | PRN

## 2016-11-03 MED ORDER — METHOCARBAMOL 500 MG PO TABS: 500.0000 mg | ORAL_TABLET | Freq: Four times a day (QID) | ORAL | 0 refills | Status: DC | PRN

## 2016-11-03 NOTE — Care Management Note (Signed)
Case Management Note  Patient Details  Name: Margaret Gordon MRN: [id number] Date of Birth: 01/14/1941  Subjective/Objective:                 Patient with order to DC to home today. Chart reviewed. No Home Health or Equipment needs, no unacknowledged Case Management consults or medication needs identified at the time of this note. Plan for DC to home. If needs arise today prior to discharge, please call Lawerance SabalDebbie Swist RN CM at [phone].    Action/Plan:   Expected Discharge Date:  11/03/16               Expected Discharge Plan:  Home/Self Care  In-House Referral:     Discharge planning Services  CM Consult  Post Acute Care Choice:    Choice offered to:     DME Arranged:    DME Agency:     HH Arranged:    HH Agency:     Status of Service:  Completed, signed off  If discussed at [employer] Length of Stay Meetings, dates discussed:    Additional Comments:  Lawerance SabalDebbie Swist, RN 11/03/2016, 8:20 AM

## 2016-11-03 NOTE — Discharge Summary (Signed)
Physician Discharge Summary  Patient ID: Margaret Gordon MRN: [id number] DOB/AGE: [birth year] 76 y.o.  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 The patient tolerated the procedure well and was taken to the recovery room and then to the floor in stable condition. The hospital course was routine. There were no complications. The wound remained clean dry and intact. Pt had appropriate back soreness. No complaints of leg pain or new N/T/W. The patient remained afebrile with stable vital signs, and tolerated a regular diet. The patient continued to increase activities, and pain was well controlled with oral pain medications.   Consults: None  Significant Diagnostic Studies:  Results for orders placed or performed during the hospital encounter of 10/30/16  Surgical pcr screen  Result Value Ref Range   MRSA, PCR NEGATIVE NEGATIVE   Staphylococcus aureus NEGATIVE NEGATIVE  Basic metabolic panel  Result Value Ref Range   Sodium 142 135 - 145 mmol/L   Potassium 4.0 3.5 - 5.1 mmol/L   Chloride 106 101 - 111 mmol/L   CO2 30 22 - 32 mmol/L   Glucose, Bld 101 (H) 65 - 99 mg/dL   BUN 21 (H) 6 - 20 mg/dL   Creatinine, Ser 6.960.96 0.44 - 1.00 mg/dL   Calcium 9.6 8.9 - 29.510.3 mg/dL   GFR calc non Af Amer 57 (L) >60 mL/min   GFR calc Af Amer >60 >60 mL/min   Anion gap 6 5 - 15  CBC WITH DIFFERENTIAL  Result Value Ref Range   WBC 6.7 4.0 - 10.5 K/uL   RBC 5.13 (H) 3.87 - 5.11 MIL/uL   Hemoglobin 11.7 (L) 12.0 - 15.0 g/dL   HCT 28.438.9 13.236.0 - 44.046.0 %   MCV 75.8 (L) 78.0 - 100.0 fL   MCH 22.8 (L) 26.0 - 34.0 pg   MCHC 30.1 30.0 - 36.0 g/dL   RDW 10.214.8  72.511.5 - 36.615.5 %   Platelets 172 150 - 400 K/uL   Neutrophils Relative % 68 %   Neutro Abs 4.7 1.7 - 7.7 K/uL   Lymphocytes Relative 23 %   Lymphs Abs 1.5 0.7 - 4.0 K/uL   Monocytes Relative 6 %   Monocytes Absolute 0.4 0.1 - 1.0 K/uL   Eosinophils Relative 3 %   Eosinophils Absolute 0.2 0.0 - 0.7 K/uL   Basophils Relative 0 %   Basophils Absolute 0.0 0.0 - 0.1 K/uL  Protime-INR  Result Value Ref Range   Prothrombin Time 13.9 11.4 - 15.2 seconds   INR 1.06   Hemoglobin A1c  Result Value Ref Range   Hgb A1c MFr Bld 5.6 4.8 - 5.6 %   Mean Plasma Glucose 114.02 mg/dL    Chest 2 View  Result Date: 10/30/2016 CLINICAL DATA:  Preoperative chest exam for lower back surgery. History of hypertension, hiatal hernia, GERD. EXAM: CHEST  2 VIEW COMPARISON:  Chest x-ray dated 04/01/2013. FINDINGS: Mild cardiomegaly is stable. Overall cardiomediastinal silhouette appears stable in size and configuration. Lungs are clear. No pleural effusion or pneumothorax seen. Mild degenerative change within the scoliotic thoracolumbar spine. Stable chronic appearing compression deformities within the lower thoracic spine. No acute or suspicious osseous finding. IMPRESSION: No active cardiopulmonary disease. No evidence  of pneumonia or pulmonary edema. Electronically Signed   By: Bary Richard M.D.   On: 10/30/2016 10:04   Dg Lumbar Spine 1 View  Result Date: 11/02/2016 CLINICAL DATA:  L3-4 and L4-5 laminectomy and foraminotomy. EXAM: LUMBAR SPINE - 1 VIEW COMPARISON:  10/06/2016. FINDINGS: The patient has 5 non-rib-bearing lumbar vertebrae with the last open disc space at the L5-S1 level. Again demonstrated is disc space narrowing, vacuum phenomena and anterior spur formation at multiple levels. There is also grade 1 anterolisthesis at the L4-5 level without significant change. Metallic localizer with its tip overlying the facets at the upper L4 level. IMPRESSION: 1. Localizer at the upper L4 level. 2. Multilevel  degenerative changes. Electronically Signed   By: Beckie Salts M.D.   On: 11/02/2016 08:33   Mm Screening Breast Tomo Bilateral  Result Date: 10/09/2016 CLINICAL DATA:  Screening. EXAM: 2D DIGITAL SCREENING BILATERAL MAMMOGRAM WITH CAD AND ADJUNCT TOMO COMPARISON:  Previous exam(s). ACR Breast Density Category b: There are scattered areas of fibroglandular density. FINDINGS: There are no findings suspicious for malignancy. Images were processed with CAD. IMPRESSION: No mammographic evidence of malignancy. A result letter of this screening mammogram will be mailed directly to the patient. RECOMMENDATION: Screening mammogram in one year. (Code:[id number]) BI-RADS CATEGORY  1: Negative. Electronically Signed   By: Annia Belt M.D.   On: 10/09/2016 12:13    Antibiotics:  Anti-infectives    Start     Dose/Rate Route Frequency Ordered Stop   11/02/16 1600  ceFAZolin (ANCEF) IVPB 2g/100 mL premix     2 g 200 mL/hr over 30 Minutes Intravenous Every 8 hours 11/02/16 1109 11/03/16 0018   11/02/16 0829  bacitracin 50,000 Units in sodium chloride irrigation 0.9 % 500 mL irrigation  Status:  Discontinued       As needed 11/02/16 0829 11/02/16 0934   11/02/16 0538  ceFAZolin (ANCEF) IVPB 2g/100 mL premix     2 g 200 mL/hr over 30 Minutes Intravenous On call to O.R. 11/02/16 0538 11/02/16 0758   11/02/16 0537  ceFAZolin (ANCEF) 2-4 GM/100ML-% IVPB    Comments:  Domenica Fail   : cabinet override      11/02/16 0537 11/02/16 0748      Discharge Exam: Blood pressure 133/82, pulse 72, temperature 98.5 F (36.9 C), resp. rate 18, SpO2 97 %. Neurologic: Grossly normal Ambulating and voiding well. Doing very well.   Discharge Medications:   Allergies as of 11/03/2016   No Known Allergies     Medication List    TAKE these medications   aspirin 81 MG tablet Take 81 mg by mouth daily.   atorvastatin 10 MG tablet Commonly known as:  LIPITOR Take 10 mg by mouth daily.   cholecalciferol 1000 units  tablet Commonly known as:  VITAMIN D Take 1,000 Units by mouth daily.   famotidine 20 MG tablet Commonly known as:  PEPCID Take 20 mg by mouth at bedtime.   HYDROcodone-acetaminophen 5-325 MG tablet Commonly known as:  NORCO/VICODIN Take 1 tablet by mouth every 4 (four) hours as needed for moderate pain. What changed:  You were already taking a medication with the same name, and this prescription was added. Make sure you understand how and when to take each.   HYDROcodone-acetaminophen 5-325 MG tablet Commonly known as:  NORCO/VICODIN Take 1 tablet by mouth 2 (two) times daily as needed for moderate pain. What changed:  Another medication with the same name was added. Make sure you understand how and  when to take each.   ibuprofen 200 MG tablet Commonly known as:  ADVIL,MOTRIN Take 400-800 mg by mouth 2 (two) times daily as needed.   losartan 50 MG tablet Commonly known as:  COZAAR Take 50 mg by mouth daily.   meloxicam 15 MG tablet Commonly known as:  MOBIC Take 15 mg by mouth daily.   methocarbamol 500 MG tablet Commonly known as:  ROBAXIN Take 1 tablet (500 mg total) by mouth every 6 (six) hours as needed for muscle spasms.   multivitamin with minerals Tabs tablet Take 1 tablet by mouth at bedtime.   OVER THE COUNTER MEDICATION Take 1,300 mg by mouth every 6 (six) hours as needed. Tylenol arthritis   pantoprazole 40 MG tablet Commonly known as:  PROTONIX Take 1 tablet (40 mg total) by mouth daily.   REFRESH OP Place 1 drop into both eyes 2 (two) times daily as needed (dry eyes).            Discharge Care Instructions        Start     Ordered   11/03/16 0000  methocarbamol (ROBAXIN) 500 MG tablet  Every 6 hours PRN     11/03/16 0718   11/03/16 0000  Increase activity slowly     11/03/16 0718   11/03/16 0000  Diet - low sodium heart healthy     11/03/16 0718   11/03/16 0000  Lifting restrictions    Comments:  No lifting more than 8 lbs   11/03/16 0718    11/03/16 0000  Driving Restrictions    Comments:  No driving   19/14/78 2956   11/03/16 0000   Remove dressing in 72 hours     11/03/16 0718   11/03/16 0000  Call MD for:  extreme fatigue     11/03/16 0718   11/03/16 0000  Call MD for:  persistant dizziness or light-headedness     11/03/16 0718   11/03/16 0000  Call MD for:  hives     11/03/16 0718   11/03/16 0000  Call MD for:  difficulty breathing, headache or visual disturbances     11/03/16 0718   11/03/16 0000  Call MD for:  redness, tenderness, or signs of infection (pain, swelling, redness, odor or green/yellow discharge around incision site)     11/03/16 0718   11/03/16 0000  Call MD for:  severe uncontrolled pain     11/03/16 0718   11/03/16 0000  Call MD for:  persistant nausea and vomiting     11/03/16 0718   11/03/16 0000  Call MD for:  temperature >100.4     11/03/16 0718   11/03/16 0000  HYDROcodone-acetaminophen (NORCO/VICODIN) 5-325 MG tablet  Every 4 hours PRN     11/03/16 2130      Disposition: home   Final Dx: same as admitting  Discharge Instructions     Remove dressing in 72 hours    Complete by:  As directed    Call MD for:  difficulty breathing, headache or visual disturbances    Complete by:  As directed    Call MD for:  extreme fatigue    Complete by:  As directed    Call MD for:  hives    Complete by:  As directed    Call MD for:  persistant dizziness or light-headedness    Complete by:  As directed    Call MD for:  persistant nausea and vomiting    Complete by:  As  directed    Call MD for:  redness, tenderness, or signs of infection (pain, swelling, redness, odor or green/yellow discharge around incision site)    Complete by:  As directed    Call MD for:  severe uncontrolled pain    Complete by:  As directed    Call MD for:  temperature >100.4    Complete by:  As directed    Diet - low sodium heart healthy    Complete by:  As directed    Driving Restrictions    Complete by:  As  directed    No driving   Increase activity slowly    Complete by:  As directed    Lifting restrictions    Complete by:  As directed    No lifting more than 8 lbs         Signed: Tiana Loft Meyran 11/03/2016, 7:18 AM

## 2016-11-03 NOTE — Progress Notes (Signed)
Patient is discharged from room 3C06 at this time. Alert and in stable condition. IV site d/c'd and instructions read to patient with understanding verbalized. Left unit via wheelchair with all belongings at side.  

## 2016-11-07 MED ORDER — BLOOD SUGAR DIAGNOSTIC TEST STRIPS
ORAL_STRIP | Freq: Two times a day (BID) | 11 refills | Status: DC
Start: 2016-11-07 — End: 2017-04-24

## 2016-11-09 ENCOUNTER — Ambulatory Visit: Payer: PRIVATE HEALTH INSURANCE | Primary: Geriatric Medicine

## 2016-12-04 ENCOUNTER — Encounter: Attending: Internal Medicine | Primary: Geriatric Medicine

## 2016-12-05 ENCOUNTER — Encounter: Attending: Internal Medicine | Primary: Geriatric Medicine

## 2016-12-14 ENCOUNTER — Encounter: Attending: Internal Medicine | Primary: Geriatric Medicine

## 2016-12-21 ENCOUNTER — Ambulatory Visit: Admit: 2016-12-21 | Discharge: 2016-12-21 | Payer: MEDICARE | Attending: Internal Medicine | Primary: Geriatric Medicine

## 2016-12-21 DIAGNOSIS — R809 Proteinuria, unspecified: Secondary | ICD-10-CM

## 2016-12-21 LAB — AMB POC LIPID PROFILE
Cholesterol (POC): 131
HDL Cholesterol (POC): 48
LDL Cholesterol (POC): 66 MG/DL
Non-HDL Goal (POC): 83
TChol/HDL Ratio (POC): 1.4
Triglycerides (POC): 85

## 2016-12-21 LAB — AMB POC HEMOGLOBIN A1C: Hemoglobin A1c (POC): 7.4 %

## 2016-12-21 LAB — AMB POC GLUCOSE BLOOD, BY GLUCOSE MONITORING DEVICE: Glucose POC: 192 mg/dL

## 2016-12-21 MED ORDER — METHIMAZOLE 5 MG TAB
5 mg | ORAL_TABLET | Freq: Every day | ORAL | 1 refills | Status: DC
Start: 2016-12-21 — End: 2017-04-19

## 2016-12-21 MED ORDER — NYSTATIN 100,000 UNIT/G TOPICAL POWDER
100000 unit/gram | Freq: Four times a day (QID) | CUTANEOUS | 3 refills | Status: DC
Start: 2016-12-21 — End: 2018-03-08

## 2016-12-21 NOTE — Progress Notes (Signed)
Chief Complaint   Patient presents with   ??? Diabetes   ??? Hypertension   ??? Rash     under left breast     1. Have you been to the ER, urgent care clinic since your last visit?  Hospitalized since your last visit?No    2. Have you seen or consulted any other health care providers outside of the [facility] since your last visit?  Include any pap smears or colon screening. No

## 2016-12-21 NOTE — Progress Notes (Signed)
Allison Newman is a 76 y.o. female and presents with Diabetes; Hypertension; and Rash (under left breast)  .  Subjective:    Pt w c/o itchy, burning rash under left breast. She has been using nystatin cream leftover, but only had a little.      Morbid obesity-  Wt Readings from Last 3 Encounters:   12/21/16 324 lb (147 kg)   09/01/16 326 lb 6.4 oz (148.1 kg)   08/10/16 324 lb 3.2 oz (147.1 kg)     CKD 2-  Lab Results   Component Value Date/Time    GFR est AA 76 03/02/2016 01:12 PM    GFR est non-AA 66 03/02/2016 01:12 PM    Creatinine (POC) 0.8 04/11/2013 11:28 AM    Creatinine 0.87 03/02/2016 01:12 PM    BUN 26 03/02/2016 01:12 PM    Sodium 140 03/02/2016 01:12 PM    Potassium 4.7 03/02/2016 01:12 PM    Chloride 100 03/02/2016 01:12 PM    CO2 20 03/02/2016 01:12 PM     Type 2 DM-controlled  Lab Results   Component Value Date/Time    Hemoglobin A1c 6.3 (H) 08/25/2015 03:55 PM    Hemoglobin A1c (POC) 7.4 12/21/2016 11:50 AM    Hemoglobin A1c, External 6.6 07/29/2014     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      Past Medical History:   Diagnosis Date   ??? Abdominal pain 05/08/2013   ??? Abnormal finding on EKG 11/17/2013   ??? ACP (advance care planning) 05/03/2015   ??? Arthritis    ??? 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 Chronic pain of left knee 08/13/2014   ??? Chronic right hip pain 03/17/2013    ??? 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 Diabetes (HCC)    ??? Elevated BUN 09/13/2011   ??? Finger lesion 03/02/2016   ??? Gallbladder polyp 04/08/2013   ??? Gastrointestinal disorder     GERD   ??? GERD (gastroesophageal reflux disease)    ??? Hypercholesterolemia    ??? Hypertension    ??? 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 Microalbuminuria 08/29/2013   ??? 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 On aspirin at home 05/03/2015   ??? Other unknown and unspecified cause of morbidity or mortality     hx of bronchitis   ??? Rash, skin 09/25/2012   ??? 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 Slow transit constipation 08/13/2014   ??? Umbilical hernia 05/08/2013     Past Surgical History:   Procedure Laterality Date   ??? 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  HX ORTHOPAEDIC  2000    screw in foot left   ??? HX OTHER SURGICAL      colonoscopy     Social History     Socioeconomic History   ??? Marital status: DIVORCED     Spouse name: Not on file   ??? Number of children: Not on file   ??? Years of education: Not on file   ??? Highest education level: Not on file   Social Needs   ??? Financial resource strain: Not on file   ??? Food insecurity - worry: Not on file   ??? Food insecurity - inability: Not on file   ??? Transportation needs - medical: Not on file   ??? Transportation needs - non-medical: Not on file   Occupational History   ??? Not on file   Tobacco Use   ??? Smoking status: Former Smoker     Packs/day: 0.25     Years: 1.00     Pack years: 0.25     Types: Cigarettes     Last attempt to quit: 06/03/1991     Years since quitting: 25.5   ??? Smokeless tobacco: Never Used   Substance and Sexual Activity   ??? Alcohol use: No     Alcohol/week: 0.0 oz   ??? Drug use: No   ??? Sexual activity: No   Other Topics Concern   ??? Not on file   Social History Narrative   ??? Not on file     Family History    Problem Relation Age of Onset   ??? Diabetes Mother    ??? Heart Disease Mother         rheumatic fever   ??? Breast Cancer Mother    ??? Cancer Father         lung   ??? Cancer Sister         colon & breast   ??? Breast Cancer Sister    ??? Cancer Brother         throat     Current Outpatient Medications   Medication Sig Dispense Refill   ??? nystatin (MYCOSTATIN) powder Apply  to affected area four (4) times daily. 120 g 3   ??? methIMAzole (TAPAZOLE) 5 mg tablet Take 1 Tab by mouth daily. 90 Tab 1   ??? glucose blood VI test strips (FREESTYLE LITE STRIPS) strip 1 Each by Does Not Apply route two (2) times a day. 100 Strip 11   ??? triamterene-hydroCHLOROthiazide (DYAZIDE) 37.5-25 mg per capsule take 1 capsule by mouth once daily 90 Cap 1   ??? fluticasone-vilanterol (BREO ELLIPTA) 200-25 mcg/dose inhaler Take 1 Puff by inhalation daily. 1 Inhaler 5   ??? metFORMIN (GLUCOPHAGE) 1,000 mg tablet Take 1 Tab by mouth two (2) times daily (with meals). 180 Tab 3   ??? CALCIUM 600 + D tablet take 1 tablet by mouth twice a day 60 Tab 11   ??? aspirin-calcium carbonate 81 mg-300 mg calcium(777 mg) tab Take 81 mg by mouth.     ??? peripheral nerve diagnos-devic (LANCETS MISC.) Test blood sugar 4 times a day     ??? metoprolol tartrate (LOPRESSOR) 50 mg tablet take 1 tablet by mouth twice a day 60 Tab 3   ??? lisinopril (PRINIVIL, ZESTRIL) 10 mg tablet take 1 tablet by mouth once daily 30 Tab 3   ??? rosuvastatin (CRESTOR) 20 mg tablet Take 1 Tab by mouth nightly. 30 Tab 6   ??? aspirin delayed-release 81 mg tablet Take  1 Tab by mouth daily. 30 Tab 11   ??? oxyCODONE-acetaminophen (PERCOCET 10) 10-325 mg per tablet take 1/2 TO 1 tablet once daily if needed for pain  0   ??? FREESTYLE LANCETS 28 gauge misc   1   ??? Lancets (FREESTYLE LANCETS) misc Test blood sugar 4 times a day 1 Package 11   ??? cyanocobalamin 1,000 mcg tablet Take 1,000 mcg by mouth daily.     ??? CALCIUM 600 + D tablet take 1 tablet by mouth twice a day 60 Tab 5    ??? JANUMET 50-500 mg per tablet take 1 tablet by mouth once daily 30 Tab 1   ??? oxyCODONE IR (OXY-IR) 15 mg immediate release tablet TAKE 1 TABLET TWICE A DAY FOR CHRONIC PAIN  0   ??? COL-RITE 100 mg capsule take 1 capsule by mouth three times a day if needed for constipation 90 Cap 3     No Known Allergies    Objective:  Visit Vitals  BP 149/65 (BP 1 Location: Left arm, BP Patient Position: Sitting)   Pulse 82   Temp 97.6 ??F (36.4 ??C) (Oral)   Resp 18   Ht 5\' 4"  (1.626 m)   Wt 324 lb (147 kg)   LMP  (LMP Unknown)   SpO2 93%   BMI 55.61 kg/m??     Physical Exam:   General appearance - alert, obese  Mental status - alert, oriented to person, place, and time  EYE-EOMI  Neck - supple, no significant adenopathy   Chest - clear to auscultation, distant BS  Heart - normal rate, regular rhythm, normal S1, S2, 2/6 systolic murmur  Abdomen - soft, nontender, nondistended, no masses or organomegaly  Ext-peripheral pulses normal, no pedal edema, no clubbing or cyanosis  Skin-erythematous, well demarcated, moist rash under left breast  Neuro -alert, oriented, normal speech, walks w walker    Results for orders placed or performed in visit on 12/21/16   AMB POC GLUCOSE BLOOD, BY GLUCOSE MONITORING DEVICE   Result Value Ref Range    Glucose POC 192 mg/dL   AMB POC LIPID PROFILE   Result Value Ref Range    Cholesterol (POC) 131     Triglycerides (POC) 85     HDL Cholesterol (POC) 48     LDL Cholesterol (POC) 66 MG/DL    Non-HDL Goal (POC) 83     TChol/HDL Ratio (POC) 1.4    AMB POC HEMOGLOBIN A1C   Result Value Ref Range    Hemoglobin A1c (POC) 7.4 %       Assessment/Plan:    ICD-10-CM ICD-9-CM    1. 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 Skin candidiasis B37.2 112.3 nystatin (MYCOSTATIN) powder   3. HTN, goal below 140/90 I10 401.9    4. Morbid obesity (HCC) E66.01 278.01       Orders Placed This Encounter   ??? AMB POC GLUCOSE BLOOD, BY GLUCOSE MONITORING DEVICE   ??? AMB POC LIPID PROFILE   ??? AMB POC HEMOGLOBIN A1C   ??? nystatin (MYCOSTATIN) powder     Sig: Apply  to affected area four (4) times daily.     Dispense:  120 g     Refill:  3   ??? methIMAzole (TAPAZOLE) 5 mg tablet     Sig: Take 1 Tab by  mouth daily.     Dispense:  90 Tab     Refill:  1     1. Diabetes mellitus with microalbuminuria (HCC)  D/w pt compliance w diet  - AMB POC GLUCOSE BLOOD, BY GLUCOSE MONITORING DEVICE  - AMB POC LIPID PROFILE  - AMB POC HEMOGLOBIN A1C    2. Skin candidiasis  D/w pt better brassier that lifts and separates breasts  - nystatin (MYCOSTATIN) powder; Apply  to affected area four (4) times daily.  Dispense: 120 g; Refill: 3    3. HTN, goal below 140/90      4. Morbid obesity (HCC)  D/w pt wt loss    There are no Patient Instructions on file for this visit.   Follow-up Disposition:  Return in about 4 months (around 04/23/2017) for DM.      I have reviewed with the patient details of the assessment and plan and all questions were answered. Relevent patient education was performed.The most recent lab findings were reviewed with the patient.    An After Visit Summary was printed and given to the patient.

## 2016-12-28 ENCOUNTER — Encounter: Attending: Internal Medicine | Primary: Geriatric Medicine

## 2017-01-16 DIAGNOSIS — M479 Spondylosis, unspecified: Secondary | ICD-10-CM | POA: Insufficient documentation

## 2017-01-29 ENCOUNTER — Encounter

## 2017-01-29 MED ORDER — LISINOPRIL 10 MG TAB
10 mg | ORAL_TABLET | ORAL | 0 refills | Status: DC
Start: 2017-01-29 — End: 2017-02-28

## 2017-02-09 ENCOUNTER — Inpatient Hospital Stay: Admit: 2017-02-09 | Payer: MEDICARE | Primary: Geriatric Medicine

## 2017-02-09 DIAGNOSIS — Z1231 Encounter for screening mammogram for malignant neoplasm of breast: Secondary | ICD-10-CM

## 2017-02-28 ENCOUNTER — Encounter

## 2017-02-28 MED ORDER — LISINOPRIL 10 MG TAB
10 mg | ORAL_TABLET | ORAL | 0 refills | Status: DC
Start: 2017-02-28 — End: 2017-04-05

## 2017-03-23 ENCOUNTER — Encounter

## 2017-03-23 NOTE — Telephone Encounter (Signed)
Patient stated that she needs a refill on her medication. She needs a new prescription for her test strips. Her insurance no longer covers the free style brand. They will cover Accu- Check True Metrix. She needs a whole new kit and strips

## 2017-03-27 ENCOUNTER — Encounter

## 2017-03-27 MED ORDER — BLOOD SUGAR DIAGNOSTIC TEST STRIPS
ORAL_STRIP | 11 refills | Status: DC
Start: 2017-03-27 — End: 2017-07-11

## 2017-03-27 MED ORDER — LANCETS
11 refills | Status: AC
Start: 2017-03-27 — End: ?

## 2017-03-27 MED ORDER — ROSUVASTATIN 20 MG TAB
20 mg | ORAL_TABLET | Freq: Every evening | ORAL | 1 refills | Status: DC
Start: 2017-03-27 — End: 2017-04-19

## 2017-03-27 MED ORDER — BLOOD GLUCOSE METER KIT
PACK | 0 refills | Status: DC
Start: 2017-03-27 — End: 2017-04-24

## 2017-03-30 DIAGNOSIS — M4800 Spinal stenosis, site unspecified: Secondary | ICD-10-CM | POA: Diagnosis not present

## 2017-03-30 DIAGNOSIS — E7800 Pure hypercholesterolemia, unspecified: Secondary | ICD-10-CM | POA: Diagnosis not present

## 2017-03-30 DIAGNOSIS — I10 Essential (primary) hypertension: Secondary | ICD-10-CM | POA: Diagnosis not present

## 2017-03-30 DIAGNOSIS — E663 Overweight: Secondary | ICD-10-CM | POA: Diagnosis not present

## 2017-04-05 ENCOUNTER — Encounter

## 2017-04-05 MED ORDER — LISINOPRIL 10 MG TAB
10 mg | ORAL_TABLET | ORAL | 2 refills | Status: DC
Start: 2017-04-05 — End: 2017-04-19

## 2017-04-09 ENCOUNTER — Encounter

## 2017-04-09 MED ORDER — BREO ELLIPTA 200 MCG-25 MCG/DOSE POWDER FOR INHALATION
200-25 mcg/dose | RESPIRATORY_TRACT | 5 refills | Status: DC
Start: 2017-04-09 — End: 2017-12-01

## 2017-04-10 MED ORDER — METOPROLOL TARTRATE 50 MG TAB
50 mg | ORAL_TABLET | ORAL | 0 refills | Status: DC
Start: 2017-04-10 — End: 2017-04-19

## 2017-04-16 DIAGNOSIS — Z6833 Body mass index (BMI) 33.0-33.9, adult: Secondary | ICD-10-CM | POA: Diagnosis not present

## 2017-04-16 DIAGNOSIS — I10 Essential (primary) hypertension: Secondary | ICD-10-CM | POA: Diagnosis not present

## 2017-04-16 DIAGNOSIS — M4316 Spondylolisthesis, lumbar region: Secondary | ICD-10-CM | POA: Diagnosis not present

## 2017-04-17 ENCOUNTER — Encounter

## 2017-04-17 MED ORDER — TRIAMTERENE-HYDROCHLOROTHIAZIDE 37.5 MG-25 MG CAP
ORAL_CAPSULE | ORAL | 1 refills | Status: DC
Start: 2017-04-17 — End: 2017-04-19

## 2017-04-19 ENCOUNTER — Encounter

## 2017-04-19 MED ORDER — METFORMIN 1,000 MG TAB
1000 mg | ORAL_TABLET | Freq: Two times a day (BID) | ORAL | 1 refills | Status: DC
Start: 2017-04-19 — End: 2017-10-15

## 2017-04-19 MED ORDER — METOPROLOL TARTRATE 50 MG TAB
50 mg | ORAL_TABLET | Freq: Two times a day (BID) | ORAL | 1 refills | Status: DC
Start: 2017-04-19 — End: 2017-10-29

## 2017-04-19 MED ORDER — ROSUVASTATIN 20 MG TAB
20 mg | ORAL_TABLET | Freq: Every evening | ORAL | 1 refills | Status: DC
Start: 2017-04-19 — End: 2017-10-15

## 2017-04-19 MED ORDER — TRIAMTERENE-HYDROCHLOROTHIAZIDE 37.5 MG-25 MG CAP
ORAL_CAPSULE | ORAL | 1 refills | Status: DC
Start: 2017-04-19 — End: 2017-10-29

## 2017-04-19 MED ORDER — LISINOPRIL 10 MG TAB
10 mg | ORAL_TABLET | Freq: Every day | ORAL | 1 refills | Status: DC
Start: 2017-04-19 — End: 2017-10-29

## 2017-04-19 MED ORDER — METHIMAZOLE 5 MG TAB
5 mg | ORAL_TABLET | Freq: Every day | ORAL | 1 refills | Status: DC
Start: 2017-04-19 — End: 2017-10-01

## 2017-04-24 ENCOUNTER — Ambulatory Visit: Admit: 2017-04-24 | Discharge: 2017-04-24 | Payer: MEDICARE | Attending: Internal Medicine | Primary: Geriatric Medicine

## 2017-04-24 DIAGNOSIS — E119 Type 2 diabetes mellitus without complications: Secondary | ICD-10-CM

## 2017-04-24 LAB — AMB POC HEMOGLOBIN A1C: Hemoglobin A1c (POC): 7.2 %

## 2017-04-24 LAB — AMB POC GLUCOSE BLOOD, BY GLUCOSE MONITORING DEVICE: Glucose POC: 139 mg/dL

## 2017-04-24 NOTE — Patient Instructions (Signed)
Learning About Diabetes Food Guidelines  Your Care Instructions    Meal planning is important to manage diabetes. It helps keep your blood sugar at a target level (which you set with your doctor). You don't have to eat special foods. You can eat what your family eats, including sweets once in a while. But you do have to pay attention to how often you eat and how much you eat of certain foods.  You may want to work with a dietitian or a certified diabetes educator (CDE) to help you plan meals and snacks. A dietitian or CDE can also help you lose weight if that is one of your goals.  What should you know about eating carbs?  Managing the amount of carbohydrate (carbs) you eat is an important part of healthy meals when you have diabetes. Carbohydrate is found in many foods.  ?? Learn which foods have carbs. And learn the amounts of carbs in different foods.  ? Bread, cereal, pasta, and rice have about 15 grams of carbs in a serving. A serving is 1 slice of bread (1 ounce), ?? cup of cooked cereal, or 1/3 cup of cooked pasta or rice.  ? Fruits have 15 grams of carbs in a serving. A serving is 1 small fresh fruit, such as an apple or orange; ?? of a banana; ?? cup of cooked or canned fruit; ?? cup of fruit juice; 1 cup of melon or raspberries; or 2 tablespoons of dried fruit.  ? Milk and no-sugar-added yogurt have 15 grams of carbs in a serving. A serving is 1 cup of milk or 2/3 cup of no-sugar-added yogurt.  ? Starchy vegetables have 15 grams of carbs in a serving. A serving is ?? cup of mashed potatoes or sweet potato; 1 cup winter squash; ?? of a small baked potato; ?? cup of cooked beans; or ?? cup cooked corn or green peas.  ?? Learn how much carbs to eat each day and at each meal. A dietitian or CDE can teach you how to keep track of the amount of carbs you eat. This is called carbohydrate counting.  ?? If you are not sure how to count carbohydrate grams, use the Plate  Method to plan meals. It is a good, quick way to make sure that you have a balanced meal. It also helps you spread carbs throughout the day.  ? Divide your plate by types of foods. Put non-starchy vegetables on half the plate, meat or other protein food on one-quarter of the plate, and a grain or starchy vegetable in the final quarter of the plate. To this you can add a small piece of fruit and 1 cup of milk or yogurt, depending on how many carbs you are supposed to eat at a meal.  ?? Try to eat about the same amount of carbs at each meal. Do not "save up" your daily allowance of carbs to eat at one meal.  ?? Proteins have very little or no carbs per serving. Examples of proteins are beef, chicken, turkey, fish, eggs, tofu, cheese, cottage cheese, and peanut butter. A serving size of meat is 3 ounces, which is about the size of a deck of cards. Examples of meat substitute serving sizes (equal to 1 ounce of meat) are 1/4 cup of cottage cheese, 1 egg, 1 tablespoon of peanut butter, and ?? cup of tofu.  How can you eat out and still eat healthy?  ?? Learn to estimate the serving sizes of foods   that have carbohydrate. If you measure food at home, it will be easier to estimate the amount in a serving of restaurant food.  ?? If the meal you order has too much carbohydrate (such as potatoes, corn, or baked beans), ask to have a low-carbohydrate food instead. Ask for a salad or green vegetables.  ?? If you use insulin, check your blood sugar before and after eating out to help you plan how much to eat in the future.  ?? If you eat more carbohydrate at a meal than you had planned, take a walk or do other exercise. This will help lower your blood sugar.  What else should you know?  ?? Limit saturated fat, such as the fat from meat and dairy products. This is a healthy choice because people who have diabetes are at higher risk of heart disease. So choose lean cuts of meat and nonfat or low-fat dairy  products. Use olive or canola oil instead of butter or shortening when cooking.  ?? Don't skip meals. Your blood sugar may drop too low if you skip meals and take insulin or certain medicines for diabetes.  ?? Check with your doctor before you drink alcohol. Alcohol can cause your blood sugar to drop too low. Alcohol can also cause a bad reaction if you take certain diabetes medicines.  Follow-up care is a key part of your treatment and safety. Be sure to make and go to all appointments, and call your doctor if you are having problems. It's also a good idea to know your test results and keep a list of the medicines you take.  Where can you learn more?  Go to http://www.healthwise.net/GoodHelpConnections.  Enter I147 in the search box to learn more about "Learning About Diabetes Food Guidelines."  Current as of: September 27, 2016  Content Version: 11.9  ?? 2006-2018 [employer]. Care instructions adapted under license by [employer] (which disclaims liability or warranty for this information). If you have questions about a medical condition or this instruction, always ask your healthcare professional. [employer] Inc[employer] disclaims any warranty or liability for your use of this information.

## 2017-04-24 NOTE — Progress Notes (Signed)
Chief Complaint   Patient presents with   ??? Diabetes     1. Have you been to the ER, urgent care clinic since your last visit?  Hospitalized since your last visit?No    2. Have you seen or consulted any other health care providers outside of the [facility] since your last visit?  Include any pap smears or colon screening. No

## 2017-04-24 NOTE — Progress Notes (Signed)
Allison Newman is a 77 y.o. female and presents with Diabetes  .  Subjective:    Pt believes her glucoses were better controlled on janumet, but the copay was too high with her previous insurance.      Morbid obesity-  Wt Readings from Last 3 Encounters:   04/24/17 317 lb (143.8 kg)   12/21/16 324 lb (147 kg)   09/01/16 326 lb 6.4 oz (148.1 kg)     CKD 2-  Lab Results   Component Value Date/Time    GFR est AA 76 03/02/2016 01:12 PM    GFR est non-AA 66 03/02/2016 01:12 PM    Creatinine (POC) 0.8 04/11/2013 11:28 AM    Creatinine 0.87 03/02/2016 01:12 PM    BUN 26 03/02/2016 01:12 PM    Sodium 140 03/02/2016 01:12 PM    Potassium 4.7 03/02/2016 01:12 PM    Chloride 100 03/02/2016 01:12 PM    CO2 20 03/02/2016 01:12 PM     Type 2 DM-controlled  Lab Results   Component Value Date/Time    Hemoglobin A1c 6.3 (H) 08/25/2015 03:55 PM    Hemoglobin A1c (POC) 7.2 04/24/2017 11:52 AM    Hemoglobin A1c, External 6.6 07/29/2014     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      Past Medical History:   Diagnosis Date   ??? Abdominal pain 05/08/2013   ??? Abnormal finding on EKG 11/17/2013   ??? ACP (advance care planning) 05/03/2015   ??? Arthritis    ??? 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 Chronic pain of left knee 08/13/2014   ??? Chronic right hip pain 03/17/2013   ??? COPD (chronic obstructive pulmonary disease) with chronic bronchitis  (HCC) 04/19/2016   ??? Diabetes (HCC)    ??? Elevated BUN 09/13/2011   ??? Finger lesion 03/02/2016   ??? Gallbladder polyp 04/08/2013   ??? Gastrointestinal disorder     GERD   ??? GERD (gastroesophageal reflux disease)    ??? Hypercholesterolemia    ??? Hypertension    ??? 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 Microalbuminuria 08/29/2013   ??? 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 On aspirin at home 05/03/2015   ??? Other unknown and unspecified cause of morbidity or mortality     hx of bronchitis   ??? Rash, skin 09/25/2012   ??? 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 Slow transit constipation 08/13/2014   ??? Umbilical hernia 05/08/2013     Past Surgical History:   Procedure Laterality Date   ??? 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 HX ORTHOPAEDIC  2000  screw in foot left   ??? HX OTHER SURGICAL      colonoscopy     Social History     Socioeconomic History   ??? Marital status: DIVORCED     Spouse name: Not on file   ??? Number of children: Not on file   ??? Years of education: Not on file   ??? Highest education level: Not on file   Tobacco Use   ??? Smoking status: Former Smoker     Packs/day: 0.25     Years: 1.00     Pack years: 0.25     Types: Cigarettes     Last attempt to quit: 06/03/1991     Years since quitting: 25.9   ??? Smokeless tobacco: Never Used   Substance and Sexual Activity   ??? Alcohol use: No     Alcohol/week: 0.0 oz   ??? Drug use: No   ??? Sexual activity: No     Family History   Problem Relation Age of Onset   ??? Diabetes Mother    ??? Heart Disease Mother         rheumatic fever   ??? Breast Cancer Mother    ??? Cancer Father         lung   ??? Cancer Sister         colon & breast   ??? Breast Cancer Sister    ??? Cancer Brother         throat     Current Outpatient Medications   Medication Sig Dispense Refill   ??? lisinopril (PRINIVIL, ZESTRIL) 10 mg tablet Take 1 Tab by mouth daily. 90 Tab 1    ??? metFORMIN (GLUCOPHAGE) 1,000 mg tablet Take 1 Tab by mouth two (2) times daily (with meals). 180 Tab 1   ??? methIMAzole (TAPAZOLE) 5 mg tablet Take 1 Tab by mouth daily. 90 Tab 1   ??? metoprolol tartrate (LOPRESSOR) 50 mg tablet Take 1 Tab by mouth two (2) times a day. 180 Tab 1   ??? rosuvastatin (CRESTOR) 20 mg tablet Take 1 Tab by mouth nightly. 90 Tab 1   ??? triamterene-hydroCHLOROthiazide (DYAZIDE) 37.5-25 mg per capsule TAKE 1 CAPSULE BY MOUTH EVERY DAY 90 Cap 1   ??? BREO ELLIPTA 200-25 mcg/dose inhaler INHALE 1 PUFF BY MOUTH DAILY 1 Each 5   ??? glucose blood VI test strips (BLOOD GLUCOSE TEST) strip Use BID DxE11.9 100 Strip 11   ??? lancets misc Use BID. Dx: E11.9 100 Each 11   ??? nystatin (MYCOSTATIN) powder Apply  to affected area four (4) times daily. 120 g 3   ??? CALCIUM 600 + D tablet take 1 tablet by mouth twice a day 60 Tab 11   ??? aspirin delayed-release 81 mg tablet Take 1 Tab by mouth daily. 30 Tab 11   ??? oxyCODONE-acetaminophen (PERCOCET 10) 10-325 mg per tablet take 1/2 TO 1 tablet once daily if needed for pain  0   ??? FREESTYLE LANCETS 28 gauge misc   1   ??? cyanocobalamin 1,000 mcg tablet Take 1,000 mcg by mouth daily.     ??? aspirin-calcium carbonate 81 mg-300 mg calcium(777 mg) tab Take 81 mg by mouth.     ??? JANUMET 50-500 mg per tablet take 1 tablet by mouth once daily 30 Tab 1     No Known Allergies    Objective:  Visit Vitals  BP 135/58 (BP 1 Location: Left arm, BP Patient Position: Sitting)   Pulse 72  Temp 97.6 ??F (36.4 ??C) (Oral)   Resp 19   Ht 5\' 4"  (1.626 m)   Wt 317 lb (143.8 kg)   LMP  (LMP Unknown)   SpO2 94%   BMI 54.41 kg/m??     Physical Exam:   General appearance - alert, obese  Mental status - alert, oriented to person, place, and time  EYE-EOMI  Neck - supple, no significant adenopathy   Chest - clear to auscultation, distant BS  Heart - normal rate, regular rhythm, normal S1, S2, 2/6 systolic murmur  Abdomen - soft, nontender, nondistended, no masses or organomegaly   Ext-peripheral pulses normal, no pedal edema, no clubbing or cyanosis  Skin-erythematous, well demarcated, moist rash under left breast  Neuro -alert, oriented, normal speech, walks w walker    Results for orders placed or performed in visit on 04/24/17   AMB POC GLUCOSE BLOOD, BY GLUCOSE MONITORING DEVICE   Result Value Ref Range    Glucose POC 139 mg/dL   AMB POC HEMOGLOBIN Z6X   Result Value Ref Range    Hemoglobin A1c (POC) 7.2 %       Assessment/Plan:    ICD-10-CM ICD-9-CM    1. Type 2 diabetes mellitus without complication, without long-term current use of insulin (HCC) E11.9 250.00 AMB POC GLUCOSE BLOOD, BY GLUCOSE MONITORING DEVICE      AMB POC HEMOGLOBIN A1C      LIPID PANEL      METABOLIC PANEL, COMPREHENSIVE      CBC W/O DIFF      HEPATITIS C AB      TSH REFLEX TO T4   2. Pernicious anemia D51.0 281.0 VITAMIN B12   3. Essential hypertension I10 401.9    4. Hypercholesterolemia E78.00 272.0    5. COPD (chronic obstructive pulmonary disease) with chronic bronchitis (HCC) J44.9 491.20    6. Morbid obesity (HCC) E66.01 278.01    7. 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 LIPID PANEL   ??? METABOLIC PANEL, COMPREHENSIVE   ??? CBC W/O DIFF   ??? HEPATITIS C AB   ??? TSH REFLEX TO T4   ??? VITAMIN B12   ??? AMB POC GLUCOSE BLOOD, BY GLUCOSE MONITORING DEVICE   ??? AMB POC HEMOGLOBIN A1C     1. Type 2 diabetes mellitus without complication, without long-term current use of insulin (HCC)  Cont current regimen  - AMB POC GLUCOSE BLOOD, BY GLUCOSE MONITORING DEVICE  - AMB POC HEMOGLOBIN A1C  - LIPID PANEL  - METABOLIC PANEL, COMPREHENSIVE  - CBC W/O DIFF  - HEPATITIS C AB  - TSH REFLEX TO T4    2. Pernicious anemia  Check lab  - VITAMIN B12    3. Essential hypertension  controlled    4. Hypercholesterolemia  Controlled on statin    5. COPD (chronic obstructive pulmonary disease) with chronic bronchitis (HCC)  Quiescent    6. Morbid obesity (HCC)  Cont wt loss!    7. Thyroid goiter  Noted       Patient Instructions        Learning About Diabetes Food Guidelines  Your Care Instructions    Meal planning is important to manage diabetes. It helps keep your blood sugar at a target level (which you set with your doctor). You don't have to eat special foods. You can eat what your family eats, including sweets once in a while. But you do have to pay attention to  how often you eat and how much you eat of certain foods.  You may want to work with a dietitian or a certified diabetes educator (CDE) to help you plan meals and snacks. A dietitian or CDE can also help you lose weight if that is one of your goals.  What should you know about eating carbs?  Managing the amount of carbohydrate (carbs) you eat is an important part of healthy meals when you have diabetes. Carbohydrate is found in many foods.  ?? Learn which foods have carbs. And learn the amounts of carbs in different foods.  ? Bread, cereal, pasta, and rice have about 15 grams of carbs in a serving. A serving is 1 slice of bread (1 ounce), ?? cup of cooked cereal, or 1/3 cup of cooked pasta or rice.  ? Fruits have 15 grams of carbs in a serving. A serving is 1 small fresh fruit, such as an apple or orange; ?? of a banana; ?? cup of cooked or canned fruit; ?? cup of fruit juice; 1 cup of melon or raspberries; or 2 tablespoons of dried fruit.  ? Milk and no-sugar-added yogurt have 15 grams of carbs in a serving. A serving is 1 cup of milk or 2/3 cup of no-sugar-added yogurt.  ? Starchy vegetables have 15 grams of carbs in a serving. A serving is ?? cup of mashed potatoes or sweet potato; 1 cup winter squash; ?? of a small baked potato; ?? cup of cooked beans; or ?? cup cooked corn or green peas.  ?? Learn how much carbs to eat each day and at each meal. A dietitian or CDE can teach you how to keep track of the amount of carbs you eat. This is called carbohydrate counting.  ?? If you are not sure how to count carbohydrate grams, use the Plate  Method to plan meals. It is a good, quick way to make sure that you have a balanced meal. It also helps you spread carbs throughout the day.  ? Divide your plate by types of foods. Put non-starchy vegetables on half the plate, meat or other protein food on one-quarter of the plate, and a grain or starchy vegetable in the final quarter of the plate. To this you can add a small piece of fruit and 1 cup of milk or yogurt, depending on how many carbs you are supposed to eat at a meal.  ?? Try to eat about the same amount of carbs at each meal. Do not "save up" your daily allowance of carbs to eat at one meal.  ?? Proteins have very little or no carbs per serving. Examples of proteins are beef, chicken, [place], fish, eggs, tofu, cheese, cottage cheese, and peanut butter. A serving size of meat is 3 ounces, which is about the size of a deck of cards. Examples of meat substitute serving sizes (equal to 1 ounce of meat) are 1/4 cup of cottage cheese, 1 egg, 1 tablespoon of peanut butter, and ?? cup of tofu.  How can you eat out and still eat healthy?  ?? Learn to estimate the serving sizes of foods that have carbohydrate. If you measure food at home, it will be easier to estimate the amount in a serving of restaurant food.  ?? If the meal you order has too much carbohydrate (such as potatoes, corn, or baked beans), ask to have a low-carbohydrate food instead. Ask for a salad or green vegetables.  ?? If you use insulin, check your  blood sugar before and after eating out to help you plan how much to eat in the future.  ?? If you eat more carbohydrate at a meal than you had planned, take a walk or do other exercise. This will help lower your blood sugar.  What else should you know?  ?? Limit saturated fat, such as the fat from meat and dairy products. This is a healthy choice because people who have diabetes are at higher risk of heart disease. So choose lean cuts of meat and nonfat or low-fat dairy  products. Use olive or canola oil instead of butter or shortening when cooking.  ?? Don't skip meals. Your blood sugar may drop too low if you skip meals and take insulin or certain medicines for diabetes.  ?? Check with your doctor before you drink alcohol. Alcohol can cause your blood sugar to drop too low. Alcohol can also cause a bad reaction if you take certain diabetes medicines.  Follow-up care is a key part of your treatment and safety. Be sure to make and go to all appointments, and call your doctor if you are having problems. It's also a good idea to know your test results and keep a list of the medicines you take.  Where can you learn more?  Go to [employer].  Enter [phone] in the search box to learn more about "Learning About Diabetes Food Guidelines."  Current as of: September 27, 2016  Content Version: 11.9  ?? 2006-2018 [employer]. Care instructions adapted under license by [employer] (which disclaims liability or warranty for this information). If you have questions about a medical condition or this instruction, always ask your healthcare professional. [employer] disclaims any warranty or liability for your use of this information.         Follow-up Disposition:  Return in about 4 months (around 08/22/2017) for DM.      I have reviewed with the patient details of the assessment and plan and all questions were answered. Relevent patient education was performed.The most recent lab findings were reviewed with the patient.    An After Visit Summary was printed and given to the patient.

## 2017-06-07 ENCOUNTER — Emergency Department: Admit: 2017-06-08 | Payer: MEDICARE | Primary: Geriatric Medicine

## 2017-06-07 DIAGNOSIS — I2699 Other pulmonary embolism without acute cor pulmonale: Principal | ICD-10-CM

## 2017-06-07 LAB — SAMPLES BEING HELD

## 2017-06-07 NOTE — ED Notes (Addendum)
2040 Pt placed on 2LNC for O2 sats at 87-88%. Pt in NAD, no increased work of breathing.    2045 Increased to 3L d/t O2 sats not going over 94%.    2110 Pt transported to XR via stretcher.    2325 MD verbalized he's okay with O2 sats above 90% so NC turned down to 2L    2340 MD okay pt to eat. TV dinner given to pt along with water. Purewick in place. No complaints at this time.    2300 Hospitalist at bedside.

## 2017-06-07 NOTE — ED Triage Notes (Signed)
Pt arrives via personal vehicle from home for c/o increasing SOB on exertion that started the past couple days. Denies CP, cough. Hx of COPD - denies wearing O2 at home. Pt reports becoming more exhausted/having to catch breath after walking a few steps or up a few stairs more than usual.

## 2017-06-07 NOTE — Other (Signed)
TRANSFER - OUT REPORT:    Verbal [facility] to [facility] L[facility] RN(name) on Langston MaskerJean C Fairbairn  [facility]       Report consisted of patient???s Situation, Background, Assessment and   Recommendations(SBAR).     Information from the following report(s) SBAR, ED Summary, Procedure Summary, MAR and Recent Results was reviewed with the receiving nurse.    Lines:       Opportunity for questions and clarification was provided.      Patient transported with:   O2 @ 2 liters

## 2017-06-07 NOTE — ED Provider Notes (Signed)
77 y.o. female with extensive past medical history, please see list, significant for COPD, S2CKD, GERD, DM, arthritis, and HTN who presents from home via personal vehicle with chief complaint of SOB. Pt presents to the ED and reports DOE that started around 06/04/17. Pt states that she began to notice the DOE as she has been having increased difficulty while breathing when climbing about 2 or 3 steps in her home. Pt has Newman hx of COPD. PT also reports occasional chest pain (w/ burning sensation). Pt states that she currently takes both lisinopril and triamterene daily. PT also reports that she takes Newman Breo inhaler when needed. Pt denies any abdominal pain, difficulty urinating, changes in urinary patterns, or regular O2 use @ home.    There are no other acute medical concerns at this time.    Surgical hx - tubal ligation, colonoscopy, right knee replacement   Social hx - Tobacco use: former smoker (quit in '93), Alcohol Use: non-drinker     PCP: Allison EhlersElliott, Charmaine N, MD    Note written by Allison Newman, Scribe, as dictated by Allison MainlandKnott, Allison Marchio A, MD 8:47 PM.      [name]. No language interpreter was used.        Past Medical History:   Diagnosis Date   ??? Abdominal pain 05/08/2013   ??? Abnormal finding on EKG 11/17/2013   ??? ACP (advance care planning) 05/03/2015   ??? Arthritis    ??? 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 Chronic pain of left knee 08/13/2014   ??? Chronic right hip pain 03/17/2013   ??? 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 Diabetes (HCC)    ??? Elevated BUN 09/13/2011   ??? Finger lesion 03/02/2016   ??? Gallbladder polyp 04/08/2013   ??? Gastrointestinal disorder     GERD   ??? GERD (gastroesophageal reflux disease)    ??? Hypercholesterolemia    ??? Hypertension    ??? 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 Microalbuminuria 08/29/2013    ??? 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 On aspirin at home 05/03/2015   ??? Other unknown and unspecified cause of morbidity or mortality     hx of bronchitis   ??? Rash, skin 09/25/2012   ??? 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 Slow transit constipation 08/13/2014   ??? Umbilical hernia 05/08/2013       Past Surgical History:   Procedure Laterality Date   ??? 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 HX ORTHOPAEDIC  2000    screw in foot left   ??? HX OTHER SURGICAL      colonoscopy   ??? HX TUBAL LIGATION           Family History:   Problem Relation Age of Onset   ??? Diabetes Mother    ??? Heart Disease Mother         rheumatic fever   ??? Breast Cancer Mother    ??? Cancer Father         lung   ??? Cancer Sister         colon & breast   ??? Breast Cancer Sister    ??? Cancer Brother  throat       Social History     Socioeconomic History   ??? Marital status: DIVORCED     Spouse name: Not on file   ??? Number of children: Not on file   ??? Years of education: Not on file   ??? Highest education level: Not on file   Occupational History   ??? Not on file   Social Needs   ??? Financial resource strain: Not on file   ??? Food insecurity:     Worry: Not on file     Inability: Not on file   ??? Transportation needs:     Medical: Not on file     Non-medical: Not on file   Tobacco Use   ??? Smoking status: Former Smoker     Packs/day: 0.25     Years: 1.00     Pack years: 0.25     Types: Cigarettes     Last attempt to quit: 06/03/1991     Years since quitting: 26.0   ??? Smokeless tobacco: Never Used   Substance and Sexual Activity   ??? Alcohol use: No     Alcohol/week: 0.0 oz   ??? Drug use: No   ??? Sexual activity: Never   Lifestyle   ??? Physical activity:     Days per week: Not on file     Minutes per session: Not on file   ??? Stress: Not on file   Relationships   ??? Social connections:     Talks on phone: Not on file      Gets together: Not on file     Attends religious service: Not on file     Active member of club or organization: Not on file     Attends meetings of clubs or organizations: Not on file     Relationship status: Not on file   ??? Intimate partner violence:     Fear of current or ex partner: Not on file     Emotionally abused: Not on file     Physically abused: Not on file     Forced sexual activity: Not on file   Other Topics Concern   ??? Not on file   Social History Narrative   ??? Not on file         ALLERGIES: Patient has no known allergies.    Review of Systems   Respiratory: Positive for shortness of breath.    Cardiovascular: Positive for chest pain.   Gastrointestinal: Negative for abdominal pain.   Genitourinary: Negative for difficulty urinating and frequency.   All other systems reviewed and are negative.      Vitals:    06/07/17 1937 06/07/17 2033 06/07/17 2038 06/07/17 2044   BP:  146/74 146/74    Pulse: 91 77  79   Resp:  16  14   Temp:  98.2 ??F (36.8 ??C)     SpO2: 90% (!) 88%  94%            Physical Exam   Constitutional: She appears well-nourished. No distress.   HENT:   Head: Normocephalic and atraumatic.   Eyes: Conjunctivae are normal.   Neck: Neck supple.   Cardiovascular: Normal rate and regular rhythm.   Pulmonary/Chest: Effort normal. No respiratory distress. She has no wheezes.   Abdominal: She exhibits no distension.   Musculoskeletal: Normal range of motion.   Pt has 3+ pitting bilateral LE edema   Neurological: She is alert. No cranial nerve  deficit.   Skin: Skin is warm and dry.   Psychiatric: She has Newman normal mood and affect. Her behavior is normal.   Nursing note and vitals reviewed.     Note written by Allison Bogus, Scribe, as dictated by Allison Mainland, MD 8:47 PM    MDM     76 y.o. female presents with progressive shortness of bretah over the last 3 days. Has history of COPD but no dyspnea at rest, no wheezing. She was  88% on room air on arrival and does not wear oxygen at home. She has notable bilateral leg pitting edema and appears to have developed some congestive heart failure likely secondary to her underlying respiratory issues. BNP is 1100+, IV lasix ordered to start diuresis as she appears volume up, no prior history of CHF so will need echo evaluation and to establish care with cardiology. Hospitalist was consulted for admission and will see the patient in the emergency department.     Procedures    ED EKG interpretation:  Rhythm: normal sinus rhythm; and regular . Rate (approx.): 79; Inferior lateral T wave inversion w/ minimal ST depressions in leads V5-V6;     All new findings when compared w/ previous EKG from 2015.    Note written by Allison Bogus, Scribe, as dictated by Allison Mainland, MD 8:47 PM    Hospitalist TigerText for Admission  10:25 PM    ED Room Number: ER22/22  Patient Name and age:  Allison Newman 77 y.o.  female  Working Diagnosis:   1. Acute diastolic congestive heart failure (HCC)      Readmission: no  Isolation Requirements:  no  Recommended Level of Care:  telemetry  Code Status:  Full

## 2017-06-07 NOTE — ED Triage Notes (Signed)
 Formatting of this note might be different from the original.  Pt arrives via personal vehicle from home for c/o increasing SOB on exertion that started the past couple days. Denies CP, cough. Hx of COPD - denies wearing O2 at home. Pt reports becoming more exhausted/having to catch breath after walking a few steps or up a few stairs more than usual.  Electronically signed by Jacqulyn Rosina HERO at 06/07/2017  8:42 PM EDT

## 2017-06-07 NOTE — ED Provider Notes (Signed)
 Formatting of this note is different from the original.  77 y.o. female with extensive past medical history, please see list, significant for COPD, S2CKD, GERD, DM, arthritis, and HTN who presents from home via personal vehicle with chief complaint of SOB. Pt presents to the ED and reports DOE that started around 06/04/17. Pt states that she began to notice the DOE as she has been having increased difficulty while breathing when climbing about 2 or 3 steps in her home. Pt has a hx of COPD. PT also reports occasional chest pain (w/ burning sensation). Pt states that she currently takes both lisinopril and triamterene  daily. PT also reports that she takes a Breo inhaler when needed. Pt denies any abdominal pain, difficulty urinating, changes in urinary patterns, or regular O2 use @ home.    There are no other acute medical concerns at this time.    Surgical hx - tubal ligation, colonoscopy, right knee replacement   Social hx - Tobacco use: former smoker (quit in '93), Alcohol Use: non-drinker     PCP: Viktoria Arty SAILOR, MD    Note written by Lonni Housekeeper, Scribe, as dictated by Caye Toribio LABOR, MD 8:47 PM.    [name]. No language interpreter was used.       Past Medical History:   Diagnosis Date   ? Abdominal pain 05/08/2013   ? Abnormal finding on EKG 11/17/2013   ? ACP (advance care planning) 05/03/2015   ? Arthritis    ? 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 Chronic pain of left knee 08/13/2014   ? Chronic right hip pain 03/17/2013   ? 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   ? Diabetes (HCC)    ? Elevated BUN 09/13/2011   ? Finger lesion 03/02/2016   ? Gallbladder polyp 04/08/2013   ? Gastrointestinal disorder     GERD   ? GERD (gastroesophageal reflux disease)    ? Hypercholesterolemia    ? Hypertension    ? 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 Microalbuminuria 08/29/2013   ?  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, adult (HCC) 08/28/2011   ? 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 On aspirin  at home 05/03/2015   ? Other unknown and unspecified cause of morbidity or mortality     hx of bronchitis   ? Rash, skin 09/25/2012   ? 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 Slow transit constipation 08/13/2014   ? Umbilical hernia 05/08/2013     Past Surgical History:   Procedure Laterality Date   ? 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   ? HX ORTHOPAEDIC  2000    screw in foot left   ? HX OTHER SURGICAL      colonoscopy   ? HX TUBAL LIGATION         Family History:   Problem Relation Age of Onset   ? Diabetes Mother    ? Heart Disease Mother         rheumatic fever   ? Breast Cancer Mother    ? Cancer Father         lung   ? Cancer Sister         colon & breast   ? Breast Cancer Sister    ? Cancer Brother  throat     Social History     Socioeconomic History   ? Marital status: DIVORCED     Spouse name: Not on file   ? Number of children: Not on file   ? Years of education: Not on file   ? Highest education level: Not on file   Occupational History   ? Not on file   Social Needs   ? Financial resource strain: Not on file   ? Food insecurity:     Worry: Not on file     Inability: Not on file   ? Transportation needs:     Medical: Not on file     Non-medical: Not on file   Tobacco Use   ? Smoking status: Former Smoker     Packs/day: 0.25     Years: 1.00     Pack years: 0.25     Types: Cigarettes     Last attempt to quit: 06/03/1991     Years since quitting: 26.0   ? Smokeless tobacco: Never Used   Substance and Sexual Activity   ? Alcohol use: No     Alcohol/week: 0.0 oz   ? Drug use: No   ? Sexual activity: Never   Lifestyle   ? Physical activity:     Days per week: Not on file     Minutes per session: Not on file   ? Stress: Not on file   Relationships   ? Social connections:     Talks on phone: Not on file     Gets together: Not on file      Attends religious service: Not on file     Active member of club or organization: Not on file     Attends meetings of clubs or organizations: Not on file     Relationship status: Not on file   ? Intimate partner violence:     Fear of current or ex partner: Not on file     Emotionally abused: Not on file     Physically abused: Not on file     Forced sexual activity: Not on file   Other Topics Concern   ? Not on file   Social History Narrative   ? Not on file     ALLERGIES: Patient has no known allergies.    Review of Systems   Respiratory: Positive for shortness of breath.    Cardiovascular: Positive for chest pain.   Gastrointestinal: Negative for abdominal pain.   Genitourinary: Negative for difficulty urinating and frequency.   All other systems reviewed and are negative.    Vitals:    06/07/17 1937 06/07/17 2033 06/07/17 2038 06/07/17 2044   BP:  146/74 146/74    Pulse: 91 77  79   Resp:  16  14   Temp:  98.2 F (36.8 C)     SpO2: 90% (!) 88%  94%       Physical Exam   Constitutional: She appears well-nourished. No distress.   HENT:   Head: Normocephalic and atraumatic.   Eyes: Conjunctivae are normal.   Neck: Neck supple.   Cardiovascular: Normal rate and regular rhythm.   Pulmonary/Chest: Effort normal. No respiratory distress. She has no wheezes.   Abdominal: She exhibits no distension.   Musculoskeletal: Normal range of motion.   Pt has 3+ pitting bilateral LE edema   Neurological: She is alert. No cranial nerve deficit.   Skin: Skin is warm and dry.   Psychiatric: She  has a normal mood and affect. Her behavior is normal.   Nursing note and vitals reviewed.    Note written by Lonni Housekeeper, Scribe, as dictated by Caye Toribio LABOR, MD 8:47 PM    MDM    77 y.o. female presents with progressive shortness of bretah over the last 3 days. Has history of COPD but no dyspnea at rest, no wheezing. She was 88% on room air on arrival and does not wear oxygen at home. She has notable bilateral leg pitting edema  and appears to have developed some congestive heart failure likely secondary to her underlying respiratory issues. BNP is 1100+, IV lasix  ordered to start diuresis as she appears volume up, no prior history of CHF so will need echo evaluation and to establish care with cardiology. Hospitalist was consulted for admission and will see the patient in the emergency department.     Procedures    ED EKG interpretation:  Rhythm: normal sinus rhythm; and regular . Rate (approx.): 79; Inferior lateral T wave inversion w/ minimal ST depressions in leads V5-V6;     All new findings when compared w/ previous EKG from 2015.    Note written by Lonni Housekeeper, Scribe, as dictated by Caye Toribio LABOR, MD 8:47 PM    Hospitalist TigerText for Admission  10:25 PM    ED Room Number: ER22/22  Patient Name and age:  Allison Newman 77 y.o.  female  Working Diagnosis:   1. Acute diastolic congestive heart failure (HCC)      Readmission: no  Isolation Requirements:  no  Recommended Level of Care:  telemetry  Code Status:  Full      Electronically signed by Caye Toribio LABOR, MD at 06/07/2017 11:00 PM EDT

## 2017-06-07 NOTE — ED Notes (Signed)
 Formatting of this note might be different from the original.  2040 Pt placed on [facility] for O2 sats at 87-88%. Pt in NAD, no increased work of breathing.    2045 Increased to 3L d/t O2 sats not going over 94%.    2110 Pt transported to XR via stretcher.    2325 MD verbalized he's okay with O2 sats above 90% so NC turned down to 2L    2340 MD okay pt to eat. TV dinner given to pt along with water. Purewick in place. No complaints at this time.    2300 Hospitalist at bedside.  Electronically signed by Jacqulyn Rosina HERO at 06/07/2017 11:03 PM EDT

## 2017-06-07 NOTE — Unmapped (Signed)
 Formatting of this note is different from the original.  TRANSFER - OUT REPORT:    Verbal report given to [facility] RN(name) on Cy JAYSON Pouch  being transferred to [facility]) for routine progression of care       Report consisted of patient?s Situation, Background, Assessment and   Recommendations(SBAR).     Information from the following report(s) SBAR, ED Summary, Procedure Summary, MAR and Recent Results was reviewed with the receiving nurse.    Lines:       Opportunity for questions and clarification was provided.      Patient transported with:   O2 @ 2 liters      Electronically signed by Jacqulyn Rosina HERO at 06/07/2017 11:23 PM EDT

## 2017-06-08 ENCOUNTER — Inpatient Hospital Stay
Admit: 2017-06-08 | Discharge: 2017-06-11 | Disposition: A | Discharge status: Home Health | Payer: MEDICARE | Attending: Internal Medicine | Admitting: Internal Medicine

## 2017-06-08 ENCOUNTER — Inpatient Hospital Stay: Admit: 2017-06-08 | Payer: MEDICARE | Primary: Geriatric Medicine

## 2017-06-08 LAB — CBC WITH AUTOMATED DIFF
ABS. BASOPHILS: 0 10*3/uL (ref 0.0–0.1)
ABS. BASOPHILS: 0 10*3/uL (ref 0.0–0.1)
ABS. EOSINOPHILS: 0.2 10*3/uL (ref 0.0–0.4)
ABS. EOSINOPHILS: 0.2 10*3/uL (ref 0.0–0.4)
ABS. IMM. GRANS.: 0 10*3/uL (ref 0.00–0.04)
ABS. IMM. GRANS.: 0 10*3/uL (ref 0.00–0.04)
ABS. LYMPHOCYTES: 1.7 10*3/uL (ref 0.8–3.5)
ABS. LYMPHOCYTES: 1.8 10*3/uL (ref 0.8–3.5)
ABS. MONOCYTES: 0.7 10*3/uL (ref 0.0–1.0)
ABS. MONOCYTES: 0.6 10*3/uL (ref 0.0–1.0)
ABS. NEUTROPHILS: 4.7 10*3/uL (ref 1.8–8.0)
ABS. NEUTROPHILS: 3.9 10*3/uL (ref 1.8–8.0)
ABSOLUTE NRBC: 0 10*3/uL (ref 0.00–0.01)
ABSOLUTE NRBC: 0 10*3/uL (ref 0.00–0.01)
BASOPHILS: 1 % (ref 0–1)
BASOPHILS: 1 % (ref 0–1)
EOSINOPHILS: 3 % (ref 0–7)
EOSINOPHILS: 4 % (ref 0–7)
HCT: 42.2 % (ref 35.0–47.0)
HCT: 41 % (ref 35.0–47.0)
HGB: 13.1 g/dL (ref 11.5–16.0)
HGB: 12.7 g/dL (ref 11.5–16.0)
IMMATURE GRANULOCYTES: 0 % (ref 0.0–0.5)
IMMATURE GRANULOCYTES: 0 % (ref 0.0–0.5)
LYMPHOCYTES: 23 % (ref 12–49)
LYMPHOCYTES: 27 % (ref 12–49)
MCH: 28.9 PG (ref 26.0–34.0)
MCH: 28.6 PG (ref 26.0–34.0)
MCHC: 31 g/dL (ref 30.0–36.5)
MCHC: 31 g/dL (ref 30.0–36.5)
MCV: 93 FL (ref 80.0–99.0)
MCV: 92.3 FL (ref 80.0–99.0)
MONOCYTES: 9 % (ref 5–13)
MONOCYTES: 9 % (ref 5–13)
MPV: 12.6 FL (ref 8.9–12.9)
MPV: 12.1 FL (ref 8.9–12.9)
NEUTROPHILS: 64 % (ref 32–75)
NEUTROPHILS: 59 % (ref 32–75)
NRBC: 0 PER 100 WBC
NRBC: 0 PER 100 WBC
PLATELET: 137 10*3/uL — ABNORMAL LOW (ref 150–400)
PLATELET: 128 10*3/uL — ABNORMAL LOW (ref 150–400)
RBC: 4.54 M/uL (ref 3.80–5.20)
RBC: 4.44 M/uL (ref 3.80–5.20)
RDW: 14.1 % (ref 11.5–14.5)
RDW: 14 % (ref 11.5–14.5)
WBC: 7.3 10*3/uL (ref 3.6–11.0)
WBC: 6.6 10*3/uL (ref 3.6–11.0)

## 2017-06-08 LAB — TROPONIN I
Troponin-I, Qt.: <0.05 ng/mL (ref <0.05)
Troponin-I, Qt.: <0.05 ng/mL (ref <0.05)

## 2017-06-08 LAB — EKG, 12 LEAD, INITIAL
Atrial Rate: 79 {beats}/min
Calculated P Axis: 52 degrees
Calculated R Axis: 25 degrees
Calculated T Axis: -38 degrees
Diagnosis: NORMAL
P-R Interval: 170 ms
Q-T Interval: 372 ms
QRS Duration: 86 ms
QTC Calculation (Bezet): 426 ms
Ventricular Rate: 79 {beats}/min

## 2017-06-08 LAB — LIPID PANEL
CHOL/HDL Ratio: 2.3 (ref 0.0–5.0)
Cholesterol, total: 116 MG/DL (ref <200)
HDL Cholesterol: 50 MG/DL
LDL, calculated: 46.2 MG/DL (ref 0–100)
Triglyceride: 99 MG/DL (ref <150)
VLDL, calculated: 19.8 MG/DL

## 2017-06-08 LAB — METABOLIC PANEL, BASIC
Anion gap: 7 mmol/L (ref 5–15)
BUN/Creatinine ratio: 25 — ABNORMAL HIGH (ref 12–20)
BUN: 24 MG/DL — ABNORMAL HIGH (ref 6–20)
CO2: 26 mmol/L (ref 21–32)
Calcium: 9.4 MG/DL (ref 8.5–10.1)
Chloride: 109 mmol/L — ABNORMAL HIGH (ref 97–108)
Creatinine: 0.97 MG/DL (ref 0.55–1.02)
GFR est AA: >60 mL/min/{1.73_m2} (ref >60)
GFR est non-AA: 56 mL/min/{1.73_m2} — ABNORMAL LOW (ref >60)
Glucose: 161 mg/dL — ABNORMAL HIGH (ref 65–100)
Potassium: 4 mmol/L (ref 3.5–5.1)
Sodium: 142 mmol/L (ref 136–145)

## 2017-06-08 LAB — GLUCOSE, POC
Glucose (POC): 181 mg/dL — ABNORMAL HIGH (ref 65–100)
Glucose (POC): 179 mg/dL — ABNORMAL HIGH (ref 65–100)
Glucose (POC): 169 mg/dL — ABNORMAL HIGH (ref 65–100)

## 2017-06-08 LAB — METABOLIC PANEL, COMPREHENSIVE
A-G Ratio: 0.7 — ABNORMAL LOW (ref 1.1–2.2)
ALT (SGPT): 13 U/L (ref 12–78)
AST (SGOT): 14 U/L — ABNORMAL LOW (ref 15–37)
Albumin: 3 g/dL — ABNORMAL LOW (ref 3.5–5.0)
Alk. phosphatase: 63 U/L (ref 45–117)
Anion gap: 8 mmol/L (ref 5–15)
BUN/Creatinine ratio: 24 — ABNORMAL HIGH (ref 12–20)
BUN: 22 MG/DL — ABNORMAL HIGH (ref 6–20)
Bilirubin, total: 0.6 MG/DL (ref 0.2–1.0)
CO2: 24 mmol/L (ref 21–32)
Calcium: 9.1 MG/DL (ref 8.5–10.1)
Chloride: 107 mmol/L (ref 97–108)
Creatinine: 0.92 MG/DL (ref 0.55–1.02)
GFR est AA: >60 mL/min/{1.73_m2} (ref >60)
GFR est non-AA: 60 mL/min/{1.73_m2} — ABNORMAL LOW (ref >60)
Globulin: 4.1 g/dL — ABNORMAL HIGH (ref 2.0–4.0)
Glucose: 219 mg/dL — ABNORMAL HIGH (ref 65–100)
Potassium: 3.7 mmol/L (ref 3.5–5.1)
Protein, total: 7.1 g/dL (ref 6.4–8.2)
Sodium: 139 mmol/L (ref 136–145)

## 2017-06-08 LAB — MAGNESIUM: Magnesium: 1.9 mg/dL (ref 1.6–2.4)

## 2017-06-08 LAB — PHOSPHORUS: Phosphorus: 3.8 MG/DL (ref 2.6–4.7)

## 2017-06-08 LAB — CK W/ CKMB & INDEX
CK - MB: 2.1 NG/ML (ref <3.6)
CK-MB Index: 1.8 (ref 0.0–2.5)
CK: 115 U/L (ref 26–192)

## 2017-06-08 LAB — HEMOGLOBIN A1C WITH EAG
Est. average glucose: 174 mg/dL
Hemoglobin A1c: 7.7 % — ABNORMAL HIGH (ref 4.2–6.3)

## 2017-06-08 LAB — TSH 3RD GENERATION: TSH: 1.33 u[IU]/mL (ref 0.36–3.74)

## 2017-06-08 LAB — ECHO ADULT COMPLETE
Aortic Root: 3.33 cm
PASP: 45 mmHg
TAPSE: 2.11 cm — AB (ref 1.5–2.0)

## 2017-06-08 LAB — NT-PRO BNP: NT pro-BNP: 1164 PG/ML — ABNORMAL HIGH (ref <125)

## 2017-06-08 LAB — EKG 12-LEAD
Atrial Rate: 79 {beats}/min
Diagnosis: NORMAL
P Axis: 52 degrees
P-R Interval: 170 ms
Q-T Interval: 372 ms
QRS Duration: 86 ms
QTc Calculation (Bazett): 426 ms
R Axis: 25 degrees
T Axis: -38 degrees
Ventricular Rate: 79 {beats}/min

## 2017-06-08 LAB — TRANSTHORACIC ECHOCARDIOGRAM (TTE) COMPLETE (CONTRAST/BUBBLE/3D PRN)
Aortic Root: 3.33 cm
Left Ventricular Ejection Fraction: 63
PASP: 45 mmHg
TAPSE: 2.11 cm — AB (ref 1.5–2)

## 2017-06-08 MED ORDER — BISACODYL 5 MG TAB, DELAYED RELEASE
5 mg | Freq: Every day | ORAL | Status: DC | PRN
Start: 2017-06-08 — End: 2017-06-11

## 2017-06-08 MED ORDER — INSULIN LISPRO 100 UNIT/ML INJECTION
100 unit/mL | Freq: Four times a day (QID) | SUBCUTANEOUS | Status: DC
Start: 2017-06-08 — End: 2017-06-11
  Administered 2017-06-08 – 2017-06-11 (×11): via SUBCUTANEOUS

## 2017-06-08 MED ORDER — GLUCOSE 4 GRAM CHEWABLE TAB
4 gram | ORAL | Status: DC | PRN
Start: 2017-06-08 — End: 2017-06-11

## 2017-06-08 MED ORDER — METOPROLOL TARTRATE 25 MG TAB
25 mg | Freq: Two times a day (BID) | ORAL | Status: DC
Start: 2017-06-08 — End: 2017-06-11
  Administered 2017-06-08 – 2017-06-11 (×7): via ORAL

## 2017-06-08 MED ORDER — GLUCAGON 1 MG INJECTION
1 mg | INTRAMUSCULAR | Status: DC | PRN
Start: 2017-06-08 — End: 2017-06-11

## 2017-06-08 MED ORDER — ACETAMINOPHEN 325 MG TABLET
325 mg | ORAL | Status: DC | PRN
Start: 2017-06-08 — End: 2017-06-11

## 2017-06-08 MED ORDER — ONDANSETRON (PF) 4 MG/2 ML INJECTION
4 mg/2 mL | INTRAMUSCULAR | Status: DC | PRN
Start: 2017-06-08 — End: 2017-06-11

## 2017-06-08 MED ORDER — SODIUM CHLORIDE 0.9 % IJ SYRG
Freq: Three times a day (TID) | INTRAMUSCULAR | Status: DC
Start: 2017-06-08 — End: 2017-06-11
  Administered 2017-06-08 – 2017-06-11 (×11): via INTRAVENOUS

## 2017-06-08 MED ORDER — IPRATROPIUM-ALBUTEROL 2.5 MG-0.5 MG/3 ML NEB SOLUTION
2.5 mg-0.5 mg/3 ml | RESPIRATORY_TRACT | Status: DC | PRN
Start: 2017-06-08 — End: 2017-06-11

## 2017-06-08 MED ORDER — METHIMAZOLE 5 MG TAB
5 mg | Freq: Every day | ORAL | Status: DC
Start: 2017-06-08 — End: 2017-06-11
  Administered 2017-06-08 – 2017-06-11 (×4): via ORAL

## 2017-06-08 MED ORDER — FUROSEMIDE 10 MG/ML IJ SOLN
10 mg/mL | Freq: Two times a day (BID) | INTRAMUSCULAR | Status: DC
Start: 2017-06-08 — End: 2017-06-08
  Administered 2017-06-08: 13:00:00 via INTRAVENOUS

## 2017-06-08 MED ORDER — OXYCODONE-ACETAMINOPHEN 10 MG-325 MG TAB
10-325 mg | Freq: Four times a day (QID) | ORAL | Status: DC | PRN
Start: 2017-06-08 — End: 2017-06-11
  Administered 2017-06-08 – 2017-06-11 (×8): via ORAL

## 2017-06-08 MED ORDER — SODIUM CHLORIDE 0.9 % IJ SYRG
INTRAMUSCULAR | Status: DC | PRN
Start: 2017-06-08 — End: 2017-06-11

## 2017-06-08 MED ORDER — ARFORMOTEROL 15 MCG/2 ML NEB SOLUTION
15 mcg/2 mL | Freq: Two times a day (BID) | RESPIRATORY_TRACT | Status: DC
Start: 2017-06-08 — End: 2017-06-11
  Administered 2017-06-09 – 2017-06-11 (×7): via RESPIRATORY_TRACT

## 2017-06-08 MED ORDER — SODIUM CHLORIDE 0.9% BOLUS IV
0.9 % | Freq: Once | INTRAVENOUS | Status: AC
Start: 2017-06-08 — End: 2017-06-08
  Administered 2017-06-08: 12:00:00 via INTRAVENOUS

## 2017-06-08 MED ORDER — BUDESONIDE 0.5 MG/2 ML NEB SUSPENSION
0.5 mg/2 mL | Freq: Two times a day (BID) | RESPIRATORY_TRACT | Status: DC
Start: 2017-06-08 — End: 2017-06-11
  Administered 2017-06-09 – 2017-06-11 (×7): via RESPIRATORY_TRACT

## 2017-06-08 MED ORDER — ENOXAPARIN 40 MG/0.4 ML SUB-Q SYRINGE
40 mg/0.4 mL | SUBCUTANEOUS | Status: DC
Start: 2017-06-08 — End: 2017-06-08
  Administered 2017-06-08: 08:00:00 via SUBCUTANEOUS

## 2017-06-08 MED ORDER — LISINOPRIL 10 MG TAB
10 mg | Freq: Every day | ORAL | Status: DC
Start: 2017-06-08 — End: 2017-06-11
  Administered 2017-06-08 – 2017-06-11 (×4): via ORAL

## 2017-06-08 MED ORDER — .PHARMACY TO SUBSTITUTE PER PROTOCOL
Status: DC | PRN
Start: 2017-06-08 — End: 2017-06-08

## 2017-06-08 MED ORDER — ROSUVASTATIN 10 MG TAB
10 mg | Freq: Every evening | ORAL | Status: DC
Start: 2017-06-08 — End: 2017-06-11
  Administered 2017-06-08 – 2017-06-11 (×4): via ORAL

## 2017-06-08 MED ORDER — ASPIRIN 81 MG TAB, DELAYED RELEASE
81 mg | Freq: Every day | ORAL | Status: DC
Start: 2017-06-08 — End: 2017-06-11
  Administered 2017-06-08 – 2017-06-11 (×4): via ORAL

## 2017-06-08 MED ORDER — SODIUM CHLORIDE 0.9 % IJ SYRG
Freq: Once | INTRAMUSCULAR | Status: AC
Start: 2017-06-08 — End: 2017-06-08
  Administered 2017-06-08: 12:00:00 via INTRAVENOUS

## 2017-06-08 MED ORDER — NYSTATIN 100,000 UNIT/G TOPICAL POWDER
100000 unit/gram | Freq: Four times a day (QID) | CUTANEOUS | Status: DC
Start: 2017-06-08 — End: 2017-06-11
  Administered 2017-06-08 – 2017-06-11 (×14): via TOPICAL

## 2017-06-08 MED ORDER — ENOXAPARIN 150 MG/ML SUB-Q SYRINGE
150 mg/mL | Freq: Two times a day (BID) | SUBCUTANEOUS | Status: DC
Start: 2017-06-08 — End: 2017-06-09
  Administered 2017-06-08 – 2017-06-09 (×2): via SUBCUTANEOUS

## 2017-06-08 MED ORDER — IOPAMIDOL 76 % IV SOLN
370 mg iodine /mL (76 %) | Freq: Once | INTRAVENOUS | Status: AC
Start: 2017-06-08 — End: 2017-06-08
  Administered 2017-06-08: 12:00:00 via INTRAVENOUS

## 2017-06-08 MED ORDER — FUROSEMIDE 10 MG/ML IJ SOLN
10 mg/mL | INTRAMUSCULAR | Status: AC
Start: 2017-06-08 — End: 2017-06-07
  Administered 2017-06-08: 02:00:00 via INTRAVENOUS

## 2017-06-08 MED ORDER — DEXTROSE 50% IN WATER (D50W) IV SYRG
INTRAVENOUS | Status: DC | PRN
Start: 2017-06-08 — End: 2017-06-11

## 2017-06-08 MED ORDER — TRIAMTERENE-HYDROCHLOROTHIAZIDE 37.5 MG-25 MG TAB
Freq: Every day | ORAL | Status: DC
Start: 2017-06-08 — End: 2017-06-11
  Administered 2017-06-08 – 2017-06-11 (×4): via ORAL

## 2017-06-08 MED FILL — METOPROLOL TARTRATE 25 MG TAB: 25 mg | ORAL | Qty: 2

## 2017-06-08 MED FILL — NORMAL SALINE FLUSH 0.9 % INJECTION SYRINGE: INTRAMUSCULAR | Qty: 10

## 2017-06-08 MED FILL — INSULIN LISPRO 100 UNIT/ML INJECTION: 100 unit/mL | SUBCUTANEOUS | Qty: 1

## 2017-06-08 MED FILL — ASPIRIN 81 MG TAB, DELAYED RELEASE: 81 mg | ORAL | Qty: 1

## 2017-06-08 MED FILL — ENOXAPARIN 40 MG/0.4 ML SUB-Q SYRINGE: 40 mg/0.4 mL | SUBCUTANEOUS | Qty: 0.4

## 2017-06-08 MED FILL — OXYCODONE-ACETAMINOPHEN 10 MG-325 MG TAB: 10-325 mg | ORAL | Qty: 1

## 2017-06-08 MED FILL — NYSTOP 100,000 UNIT/GRAM TOPICAL POWDER: 100000 unit/gram | CUTANEOUS | Qty: 15

## 2017-06-08 MED FILL — ISOVUE-370  76 % INTRAVENOUS SOLUTION: 370 mg iodine /mL (76 %) | INTRAVENOUS | Qty: 100

## 2017-06-08 MED FILL — FUROSEMIDE 10 MG/ML IJ SOLN: 10 mg/mL | INTRAMUSCULAR | Qty: 4

## 2017-06-08 MED FILL — SODIUM CHLORIDE 0.9 % IV: INTRAVENOUS | Qty: 100

## 2017-06-08 MED FILL — BUDESONIDE 0.5 MG/2 ML NEB SUSPENSION: 0.5 mg/2 mL | RESPIRATORY_TRACT | Qty: 1

## 2017-06-08 MED FILL — LOVENOX 150 MG/ML SUBCUTANEOUS SYRINGE: 150 mg/mL | SUBCUTANEOUS | Qty: 0.93

## 2017-06-08 MED FILL — BROVANA 15 MCG/2 ML SOLUTION FOR NEBULIZATION: 15 mcg/2 mL | RESPIRATORY_TRACT | Qty: 1

## 2017-06-08 MED FILL — LISINOPRIL 10 MG TAB: 10 mg | ORAL | Qty: 1

## 2017-06-08 MED FILL — METHIMAZOLE 5 MG TAB: 5 mg | ORAL | Qty: 1

## 2017-06-08 MED FILL — ROSUVASTATIN 10 MG TAB: 10 mg | ORAL | Qty: 2

## 2017-06-08 MED FILL — TRIAMTERENE-HYDROCHLOROTHIAZIDE 37.5 MG-25 MG TAB: ORAL | Qty: 1

## 2017-06-08 NOTE — Progress Notes (Addendum)
Problem: Falls - Risk of  Goal: *Absence of Falls  Description  Document Allison Newman Fall Risk and appropriate interventions in the flowsheet.  Outcome: Progressing Towards Goal  Note:   Fall Risk Interventions:  Mobility Interventions: Patient to call before getting OOB         Medication Interventions: Patient to call before getting OOB    Elimination Interventions: Call light in reach            Bedside shift change report given to Huntley DecSara RN (oncoming nurse) by Jodelle GreenWhitley RN (offgoing nurse). Report included the following information SBAR, Kardex, Intake/Output, MAR, Accordion and Recent Results.

## 2017-06-08 NOTE — Progress Notes (Signed)
Problem: Falls - Risk of  Goal: *Absence of Falls  Description  Document Bridgette HabermannSchmid Fall Risk and appropriate interventions in the flowsheet.  Outcome: Progressing Towards Goal     Problem: Pulmonary Embolism Care Plan (Adult)  Goal: *Improvement of existing pulmonary embolism  Outcome: Progressing Towards Goal  Goal: *Labs within defined limits  Outcome: Progressing Towards Goal

## 2017-06-08 NOTE — Progress Notes (Signed)
Discussed the plan of care with the patient. The patient was provided the opportunity to ask questions and expressed understanding.

## 2017-06-08 NOTE — Consults (Signed)
Consults  by Mathews Robinsons, MD at 06/08/17 1248                Author: Mathews Robinsons, MD  Service: Cardiology  Author Type: Physician       Filed: 06/08/17 1418  Date of Service: 06/08/17 1248  Status: Addendum          Editor: Mathews Robinsons, MD (Physician)          Related Notes: Original Note by Zimmer, Rosezella Florida., NP (Nurse Practitioner) filed at 06/08/17 1413            Consult Orders        1. IP CONSULT TO CARDIOLOGY [[id number]] ordered by Noralyn Pick, MD at 06/07/17 2342                                                                                                     C ardiology Consult Note         Patient Name: Allison Newman  DOB : September 26, 1940 MRN: [id number]   Date: 06/08/2017  Time : 12:48 PM      Admit Diagnosis: Acute CHF (congestive heart failure) (HCC) [I50.9]      Primary Cardiologist: Dr. Marval Regal, MD     Consulting Cardiologist: Jasminne Mealy K. Gasper Lloyd, M.D.      Reason for Consult: Dante Gang      Requesting MD: Dr. Lum Babe, MD     Assessment/Plan/Discussion:Cardiology Attending :          Patient seen on the day of progress note and examined  and agree with Advance Practice Provider (APP, NP,PA)  assessment and plans.    GWENDLYON ZUMBRO is a 77 y.o.  female    2015 evaluation for preop EKG , led to stress test and fixed defect with no angina   Since then no angina   Admit SOB, ProBNP really pretty close to [facility] for age   Found to have PE   Will await echo , if normal then no changes CV wise are needed   C/o cramp in leg, less SOB   Clear lungs normal HS and no edema, +very obese   She asked about cause of PE, thus far no id cause-can fu with PCP and Hospitalist/Internal Medicine team    Mathews Robinsons, MD               HPI:   Allison Newman is a 77 y.o.  female who has PMH of HTN, DM, CKD, COPD (not on home O2), arthritis, chronic pain, HLD, thyroid disease. presented on  06/07/2017  for chief c/o progressive worsening of SOB over 2-3 days.  O2 sats were 88% on RA in ER.  No chest pain but did  experience some new burning in her chest on Wednesday when walking up stairs  as well as worsened dyspnea.   No  dizziness, syncope. Did experience some palpitations.  No cough. Reports she has chronic LLE edema from extremity injury.  States prior to onset of SOB over past 2-3 days, she had no SOB at rest or with  walking.  Denies  any recent weight gain, in fact has been dieting and has lost 15 lbs since January.  Does not exercise.   Workup included a CTA of chest which noted multiple bilateral PEs in both lungs.     Cardiology consulted for concerns for CHF.      Pt was last seen by Dr. Marval Regal in 2015 for pre-procedure (EGD) evaluation.  Had nuclear stress test done then which showed fixed inferior wall defect.  Reports LHC 16-18 years ago at [place] which she said showed no blockages and had no stents placed.         Subjective:  Denies chest pain, chest burning, SOB currently but says she has not been out of bed.  Denies dizziness, lightheadedness          Assessment and Plan        1. SOB/Acute hypoxic respiratory failure   -No SOB at rest currently   -CXR with no evidence of fluid overload   -Pro BNP 1164 (mildly elevated 900 cuttoff to age 26 then cuttoff is 1800 at age 20)   -Troponin neg x 2   -Echo pending.   -SOB due to bilateral PEs  But will follow up echo      2. Acute bilateral PE   -Lovenox 1 mg/kg- transition to oral anticoagulation as per primary team      3 Hx of HTN: currently 133/91   -continue lisinopril, triamterene/HCTZ, lopressor 50 mg BID      4. Hx of abnormal stress test:   -Fixed inferior defect in 2015. No reversible ischemia.     -on ASA, statin.     -troponin neg. X 2. 12 lead EKG: NSR with nospecific t wave changes.   -No active chest pain.      5. Hx of DM: A1C 7.7      6. Hx of HLD: LDL 46.2   -Continue Crestor 20 mg daily      7. Morbid obesity Body mass index is 53.69 kg/m??.         Pt presented with chief c/o progressive worsening of SOB. Noted to have bilateral PEs, no  DVT.  Suspect SOB due to bilateral PEs but will follow up on echocardiogram.      Review of Symptoms:   Constitutional: Negative for fever, chills,  malaise/fatigue.    HEENT: Negative for dysphagia    Respiratory: Positive for SOB   Cardiovascular: Negative for chest pain, palpitations currently, negative, orthopnea, +leg swelling, negative syncope, and PND.    Gastrointestinal: Negative for nausea, vomiting, diarrhea, blood in stool and melena, abdominal pain    Genitourinary: Negative for hematuria.    Musculoskeletal: Negative for myalgias   Skin: Negative for rash.    Heme: Does not bleed or bruise easily.    Neurological: Negative for speech changes and focal weakness         Previous treatment/evaluation includes echocardiogram, cardiac catheterization and lexiscan stress .   Cardiac risk factors: smoking/ tobacco exposure, dyslipidemia, diabetes mellitus, obesity, sedentary life style, hypertension, post-menopausal.        Past Medical History:        Diagnosis  Date         ?  Abdominal pain  05/08/2013     ?  Abnormal finding on EKG  11/17/2013     ?  ACP (advance care planning)  05/03/2015     ?  Arthritis       ?  Chronic  kidney disease (CKD), stage II (mild)  03/02/2016     ?  Chronic pain disorder  06/04/2013     ?  Chronic pain of left knee  08/13/2014     ?  Chronic right hip pain  03/17/2013     ?  COPD (chronic obstructive pulmonary disease) with chronic bronchitis (HCC)  04/19/2016     ?  Diabetes (HCC)       ?  Elevated BUN  09/13/2011     ?  Finger lesion  03/02/2016     ?  Gallbladder polyp  04/08/2013     ?  Gastrointestinal disorder            GERD         ?  GERD (gastroesophageal reflux disease)       ?  Hypercholesterolemia       ?  Hypertension       ?  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  Microalbuminuria  08/29/2013     ?  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, adult (HCC)  08/28/2011     ?  Neuropathic arthritis  08/28/2011     ?  Neuropathic arthritis  08/28/2011     ?  On aspirin at home  05/03/2015      ?  Other unknown and unspecified cause of morbidity or mortality            hx of bronchitis         ?  Rash, skin  09/25/2012     ?  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)  03/02/2016     ?  Slow transit constipation  08/13/2014         ?  Umbilical hernia  05/08/2013          Past Surgical History:         Procedure  Laterality  Date          ?  HX ORTHOPAEDIC    1998          knee replacement right          ?  HX ORTHOPAEDIC    2000          screw in foot left          ?  HX OTHER SURGICAL              colonoscopy          ?  HX TUBAL LIGATION              Current Facility-Administered Medications          Medication  Dose  Route  Frequency           ?  aspirin delayed-release tablet 81 mg   81 mg  Oral  DAILY     ?  lisinopril (PRINIVIL, ZESTRIL) tablet 10 mg   10 mg  Oral  DAILY     ?  methIMAzole (TAPAZOLE) tablet 5 mg   5 mg  Oral  DAILY     ?  metoprolol tartrate (LOPRESSOR) tablet 50 mg   50 mg  Oral  BID     ?  nystatin (MYCOSTATIN) 100,000 unit/gram powder     Topical  QID     ?  oxyCODONE-acetaminophen (PERCOCET 10) 10-325 mg per tablet 1 Tab   1 Tab  Oral  Q6H PRN     ?  rosuvastatin (CRESTOR) tablet 20  mg   20 mg  Oral  QHS     ?  triamterene-hydroCHLOROthiazide (MAXZIDE) 37.5-25 mg per tablet 1 Tab   1 Tab  Oral  DAILY     ?  sodium chloride (NS) flush 5-40 mL   5-40 mL  IntraVENous  Q8H     ?  sodium chloride (NS) flush 5-40 mL   5-40 mL  IntraVENous  PRN     ?  ondansetron (ZOFRAN) injection 4 mg   4 mg  IntraVENous  Q4H PRN     ?  bisacodyl (DULCOLAX) tablet 5 mg   5 mg  Oral  DAILY PRN     ?  insulin lispro (HUMALOG) injection     SubCUTAneous  AC&HS     ?  glucose chewable tablet 16 g   4 Tab  Oral  PRN     ?  dextrose (D50W) injection syrg 12.5-25 g   12.5-25 g  IntraVENous  PRN     ?  glucagon (GLUCAGEN) injection 1 mg   1 mg  IntraMUSCular  PRN     ?  arformoterol (BROVANA) neb solution 15 mcg   15 mcg  Nebulization  BID RT          And           ?  budesonide (PULMICORT) 500 mcg/2 ml  nebulizer suspension   500 mcg  Nebulization  BID RT     ?  enoxaparin (LOVENOX) injection 140 mg   140 mg  SubCUTAneous  Q12H     ?  acetaminophen (TYLENOL) tablet 650 mg   650 mg  Oral  Q4H PRN           ?  albuterol-ipratropium (DUO-NEB) 2.5 MG-0.5 MG/3 ML   3 mL  Nebulization  Q4H PRN          No Known Allergies      Family History         Problem  Relation  Age of Onset          ?  Diabetes  Mother       ?  Heart Disease  Mother                rheumatic fever          ?  Breast Cancer  Mother       ?  Cancer  Father                lung          ?  Cancer  Sister                colon & breast          ?  Breast Cancer  Sister       ?  Cancer  Brother                throat           Social History          Socioeconomic History         ?  Marital status:  DIVORCED              Spouse name:  Not on file         ?  Number of children:  Not on file     ?  Years of education:  Not on file     ?  Highest education level:  Not on file       Tobacco Use         ?  Smoking status:  Former Smoker              Packs/day:  0.25         Years:  1.00         Pack years:  0.25         Types:  Cigarettes         Last attempt to quit:  06/03/1991         Years since quitting:  26.0         ?  Smokeless tobacco:  Never Used       Substance and Sexual Activity         ?  Alcohol use:  No              Alcohol/week:  0.0 oz         ?  Drug use:  No         ?  Sexual activity:  Never           Objective:     Physical Exam      Vitals:      Vitals:             06/08/17 0048  06/08/17 0336  06/08/17 0928  06/08/17 1157           BP:  153/82  122/59  (!) 163/99  (!) 133/91     Pulse:  86  92  89  77     Resp:  20  22  22  24      Temp:    98.5 ??F (36.9 ??C)  98 ??F (36.7 ??C)  97.6 ??F (36.4 ??C)     SpO2:  93%  96%    96%           Weight:    312 lb 12.8 oz (141.9 kg)                  General:     Alert, cooperative, no distress, appears stated age.        Neck:    Supple,      Back:      Symmetric, .      Lungs:      clear to auscultation  bilaterally.     Heart::     Regular rate and rhythm, S1, S2 normal, no murmur, click, rub or gallop.        Abdomen:      Soft, obese, non-tender. Bowel sounds present.     Extremities:   LLE trace edema     Vascular:    Pulses - 2+     Skin:    Skin color normal. No rashes or lesions     Neurologic:    CN II-XII grossly intact.            Telemetry: NSR   ECG: as above   Data Review:       Radiology: reviewed cta chest and cxr report     Recent Labs            06/08/17   0400  06/07/17   1954     CPK  115   --          TROIQ  <0.05  <0.05          Recent Labs  06/08/17   0400  06/07/17   1954     NA  139  142     K  3.7  4.0     CL  107  109*     CO2  24  26     BUN  22*  24*     CREA  0.92  0.97     GLU  219*  161*     PHOS  3.8   --          CA  9.1  9.4          Recent Labs            06/08/17   0400  06/07/17   1954     WBC  6.6  7.3     HGB  12.7  13.1     HCT  41.0  42.2         PLT  128*  137*          Recent Labs           06/08/17   0400     SGOT  14*        AP  63          Recent Labs           06/08/17   0400     CHOL  116        LDLC  46.2          Recent Labs           06/08/17   0350        TSH  1.33           Thank you very much for this referral. I appreciate the opportunity to participate in this patient's care. I will follow along with above stated plan.      Rosezella FloridaLisa M. Zimmer, NP              [facility] [place]      [address], [address]      [place], [place] [id number]      [phone]      NW:GNFAOZHCC:Elliott, Deliah Goodyharmaine N, MD

## 2017-06-08 NOTE — Progress Notes (Signed)
 Formatting of this note is different from the original.  NUTRITION    Chart reviewed.  Weight loss diet instruction completed; provided pt with handouts and outpatient dietitian contact information.  Will gladly follow up for additional questions as needed.  Thank you.     Berwyn LELON Bruns, RD     Electronically signed by Bruns Berwyn LELON, RD at 06/08/2017 11:38 AM EDT

## 2017-06-08 NOTE — Progress Notes (Signed)
 Formatting of this note might be different from the original.  Pt de sat to 80's,positive for bil PE,on lovenox -  Bedside shift change report given to [employer] (oncoming nurse) by C.BrenkeRN Physiological scientist). Report included the following information SBAR, Kardex, Procedure Summary, MAR and Recent Results.     Electronically signed by Daryll Leanne GAILS, RN at 06/08/2017  5:36 PM EDT

## 2017-06-08 NOTE — H&P (Signed)
 Formatting of this note might be different from the original.  H&P dictated [id number]  Electronically signed by Anders Thersia LABOR, MD at 06/08/2017  4:26 AM EDT

## 2017-06-08 NOTE — Progress Notes (Signed)
 Formatting of this note might be different from the original.    Problem: Falls - Risk of  Goal: *Absence of Falls  Description  Document Deloris Fall Risk and appropriate interventions in the flowsheet.  Outcome: Progressing Towards Goal    Problem: Pulmonary Embolism Care Plan (Adult)  Goal: *Improvement of existing pulmonary embolism  Outcome: Progressing Towards Goal  Goal: *Labs within defined limits  Outcome: Progressing Towards Goal    Electronically signed by Daryll Leanne GAILS, RN at 06/08/2017  5:37 PM EDT

## 2017-06-08 NOTE — Progress Notes (Signed)
 Formatting of this note might be different from the original.  Reason for Admission:   Shortness of breath    RRAT Score:    29-Red-High     Resources/supports as identified by patient/family:   Patient lives at home with her 75 year old grandson. Patient has three sons who are supportive.    Top Challenges facing patient (as identified by patient/family and CM):      Finances/Medication cost?      Patient has [employer] Medicare/preferred pharmacy is [facility]. Patient is able to afford medications.    Transportation?  Patient's family will assist.    Support system or lack thereof?  Three sons and a grandson who lives with her.    Living arrangements?  Patient and her 46 year old grandson live together.       Self-care/ADLs/Cognition?  Patient reports that she is independent with self-care and ADLs. Patient has two canes and two walkers in which she uses to assist with ambulation. Patient is alert and oriented to all spheres.    Current Advanced Directive/Advance Care Plan:  No AMD on file. Patient is Full code status.    Plan for utilizing home health:  No current home health needs.      Likelihood of readmission: Moderate    Transition of Care Plan:    The current plan is for patient to discharge home when medically ready.     Rosina LOISE Matte, MS                Electronically signed by Matte Rosina LOISE at 06/08/2017  5:00 PM EDT

## 2017-06-08 NOTE — Progress Notes (Signed)
 Formatting of this note is different from the original.  Images from the original note were not included.          Hospitalist Progress Note  Torrie Rao, MD  Answering service: [phone] OR [phone] from in house phone      Date of Service:  06/08/2017  NAME:  Allison Newman  DOB:  02-19-1941  MRN:  [id number]  PCP: Allison Arty SAILOR, MD    Chief Complaint:   Chief Complaint   Patient presents with   ? Shortness of Breath     Admission Summary:     Allison Newman is a 77 y.o. female who presented with SOB    Interval history / Subjective:   Patient seen for Follow up of PE  First encounter  Patient seen and examined by the bedside  Labs, images and notes reviewed  Patient is comfortable, denies chest pain, reports SOB x few days, which progressive. Patient lives a sedentary life style. Denies any abdominal pain, denies bleeding episodes, no NVD  SOB is improving  Doesn't use O2 at home  No fevers or chills  Discussed with nursing staff, no acute issues overnight, orders reviewed.     Assessment & Plan:     Acute hypoxemic respiratory failure sec to Bilateral PE, POA  O2 sat 88% on RA  DW Dr. Orelia from radiology, patient has bilateral PE on CTA chest  NIV pending  No obvious signs/symptoms of Pneumonia, no CHF, will DC Lasix   Lovenox  1 mg/Kg starting today, possible DC on OAC in am if stable  Tele bed  O2 support, wean as tolerated  Duoneb    Thrombocytopenia  No bleeding  Will monitor CBC in am    Questionable CHF  Echo pending  I assume her symptoms are due to PE and NOT CHF. 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  Weight loss recommended    Code status: Full Code    DVT prophylaxis: Full Code    Care Plan discussed with: Patient/Family, Nurse and Consultant Dr. Orelia fron Radiology    Disposition: TBD    Hospital Problems  Date  Reviewed: 06-15-17          Codes Class Noted POA    Acute hypoxemic respiratory failure (HCC) ICD-10-CM: J96.01  ICD-9-CM: 518.81  06/08/2017 Yes      * (Principal) Acute pulmonary embolism (HCC) ICD-10-CM: I26.99  ICD-9-CM: 415.19  06/08/2017 Yes      DM2 (diabetes mellitus, type 2) (HCC) (Chronic) ICD-10-CM: E11.9  ICD-9-CM: 250.00  06/08/2017 Yes      Hyperthyroidism (Chronic) ICD-10-CM: E05.90  ICD-9-CM: 242.90  06/08/2017 Yes      Essential hypertension ICD-10-CM: I10  ICD-9-CM: 401.9  08/28/2011 Yes      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, adult (HCC) (Chronic) ICD-10-CM: E66.01, Z68.44  ICD-9-CM: 278.01, V85.44  08/28/2011 Yes         Review of Systems:   A comprehensive review of systems was negative.     Physical Examination:       General appearance: alert, no distress, appears stated age  Head: Normocephalic, without obvious abnormality, atraumatic  Lungs: clear to auscultation bilaterally  Heart: regular rate and rhythm  Abdomen: soft,  NT, NG, NR  Extremities: edema 1+ pitting BL LE  Skin: No rash  Neurologic: Grossly normal      Vital Signs:    Last 24hrs VS reviewed since prior progress note. Most recent are:    Visit Vitals  BP (!) 163/99 (BP 1 Location: Left arm, BP Patient Position: At rest)   Pulse 89   Temp 98 F (36.7 C)   Resp 22   Wt 141.9 kg (312 lb 12.8 oz)   SpO2 96%   BMI 53.69 kg/m     Intake/Output Summary (Last 24 hours) at 06/08/2017 1149  Last data filed at 06/08/2017 0600  Gross per 24 hour   Intake 480 ml   Output 2500 ml   Net -2020 ml       Tmax:  Temp (24hrs), Avg:98.3 F (36.8 C), Min:98 F (36.7 C), Max:98.5 F (36.9 C)    Data Review:     Xr Chest Pa Lat    Result Date: 06/07/2017  CLINICAL HISTORY: Hypoxia INDICATION: Hypoxia COMPARISON: 10/27/2014 FINDINGS: PA and lateral views of the chest are obtained. The cardiopericardial silhouette is prominent. Diminished lung volumes. Pulmonary artery enlargement is suggested.. There is no pleural effusion, pneumothorax or focal consolidation  present.     IMPRESSION: Mild cardiomegaly. Diminished lung volumes with pulmonary artery enlargement suggested.    No results found for: SDES  Lab Results   Component Value Date/Time    Culture result: MIXED SKIN FLORA ISOLATED 12/04/2012 04:45 PM     All Micro Results     Procedure Component Value Units Date/Time    URINE CULTURE HOLD SAMPLE [[id number]]     Order Status:  Sent Specimen:  Urine        Labs:     Recent Labs     06/08/17  0400 06/07/17  1954   WBC 6.6 7.3   HGB 12.7 13.1   HCT 41.0 42.2   PLT 128* 137*     Recent Labs     06/08/17  0400 06/07/17  1954   NA 139 142   K 3.7 4.0   CL 107 109*   CO2 24 26   BUN 22* 24*   CREA 0.92 0.97   GLU 219* 161*   CA 9.1 9.4   MG 1.9  --    PHOS 3.8  --      Recent Labs     06/08/17  0400   SGOT 14*   ALT 13   AP 63   TBILI 0.6   TP 7.1   ALB 3.0*   GLOB 4.1*     No results for input(s): INR, PTP, APTT in the last 72 hours.    No lab exists for component: INREXT, INREXT   No results for input(s): FE, TIBC, PSAT, FERR in the last 72 hours.   No results found for: FOL, RBCF   No results for input(s): PH, PCO2, PO2 in the last 72 hours.  Recent Labs     06/08/17  0400 06/07/17  1954   CPK 115  --    CKNDX 1.8  --    TROIQ <0.05 <0.05     Lab Results   Component Value Date/Time    Cholesterol, total 116 06/08/2017 04:00 AM    HDL Cholesterol 50 06/08/2017 04:00 AM    LDL, calculated 46.2 06/08/2017 04:00 AM    Triglyceride 99 06/08/2017 04:00 AM    CHOL/HDL Ratio 2.3 06/08/2017 04:00 AM  Lab Results   Component Value Date/Time    Glucose (POC) 181 (H) 06/08/2017 06:23 AM    Glucose (POC) 143 (H) 01/03/2008 09:21 AM    Glucose POC 139 04/24/2017 11:45 AM    Glucose POC 192 12/21/2016 11:52 AM     Lab Results   Component Value Date/Time    Color YELLOW/STRAW 12/04/2012 04:45 PM    Appearance CLOUDY (A) 12/04/2012 04:45 PM    Specific gravity 1.020 12/04/2012 04:45 PM    pH (UA) 5.5 12/04/2012 04:45 PM    Protein NEGATIVE  12/04/2012 04:45 PM    Glucose NEGATIVE   12/04/2012 04:45 PM    Ketone NEGATIVE  12/04/2012 04:45 PM    Bilirubin NEGATIVE  12/04/2012 04:45 PM    Urobilinogen 0.2 12/04/2012 04:45 PM    Nitrites NEGATIVE  12/04/2012 04:45 PM    Leukocyte Esterase SMALL (A) 12/04/2012 04:45 PM    Epithelial cells MODERATE (A) 12/04/2012 04:45 PM    Bacteria 1+ (A) 12/04/2012 04:45 PM    WBC 5-10 12/04/2012 04:45 PM    RBC 0-5 12/04/2012 04:45 PM     Medications Reviewed:     Current Facility-Administered Medications   Medication Dose Route Frequency   ? aspirin  delayed-release tablet 81 mg  81 mg Oral DAILY   ? lisinopril (PRINIVIL, ZESTRIL) tablet 10 mg  10 mg Oral DAILY   ? methIMAzole  (TAPAZOLE ) tablet 5 mg  5 mg Oral DAILY   ? metoprolol  tartrate (LOPRESSOR ) tablet 50 mg  50 mg Oral BID   ? nystatin (MYCOSTATIN) 100,000 unit/gram powder   Topical QID   ? oxyCODONE-acetaminophen  (PERCOCET 10) 10-325 mg per tablet 1 Tab  1 Tab Oral Q6H PRN   ? rosuvastatin  (CRESTOR ) tablet 20 mg  20 mg Oral QHS   ? triamterene -hydroCHLOROthiazide  (MAXZIDE ) 37.5-25 mg per tablet 1 Tab  1 Tab Oral DAILY   ? sodium chloride  (NS) flush 5-40 mL  5-40 mL IntraVENous Q8H   ? sodium chloride  (NS) flush 5-40 mL  5-40 mL IntraVENous PRN   ? ondansetron  (ZOFRAN ) injection 4 mg  4 mg IntraVENous Q4H PRN   ? bisacodyl (DULCOLAX) tablet 5 mg  5 mg Oral DAILY PRN   ? insulin lispro (HUMALOG) injection   SubCUTAneous AC&HS   ? glucose chewable tablet 16 g  4 Tab Oral PRN   ? dextrose (D50W) injection syrg 12.5-25 g  12.5-25 g IntraVENous PRN   ? glucagon (GLUCAGEN) injection 1 mg  1 mg IntraMUSCular PRN   ? arformoterol  (BROVANA ) neb solution 15 mcg  15 mcg Nebulization BID RT    And   ? budesonide  (PULMICORT ) 500 mcg/2 ml nebulizer suspension  500 mcg Nebulization BID RT   ? enoxaparin  (LOVENOX ) injection 140 mg  1 mg/kg SubCUTAneous Q12H   ? acetaminophen  (TYLENOL ) tablet 650 mg  650 mg Oral Q4H PRN   ? albuterol-ipratropium (DUO-NEB) 2.5 MG-0.5 MG/3 ML  3 mL Nebulization Q4H PRN      ______________________________________________________________________  EXPECTED LENGTH OF STAY: 2d 9h  ACTUAL LENGTH OF STAY:          1      Zeeshan Khakwani, MD       Electronically signed by Khakwani, Zeeshan, MD at 06/08/2017 11:49 AM EDT

## 2017-06-08 NOTE — Progress Notes (Signed)
 Formatting of this note might be different from the original.  Discussed the plan of care with the patient. The patient was provided the opportunity to ask questions and expressed understanding.   Electronically signed by Lida Gerard ORN at 06/08/2017  5:38 PM EDT

## 2017-06-08 NOTE — Progress Notes (Signed)
 Formatting of this note might be different from the original.  Bedside shift change report given to Carmi, Charity fundraiser (Cabin crew) by Gerard, RN (offgoing nurse). Report included the following information SBAR, Kardex, ED Summary, OR Summary, Procedure Summary, Intake/Output, MAR, Accordion, Recent Results, Med Rec Status, Cardiac Rhythm NSR, Alarm Parameters , Pre Procedure Checklist, Procedure Verification and Quality Measures.     Electronically signed by Lida Gerard ORN at 06/08/2017  8:05 PM EDT

## 2017-06-08 NOTE — Progress Notes (Signed)
 Formatting of this note might be different from the original.    Problem: Falls - Risk of  Goal: *Absence of Falls  Description  Document Allison Newman Fall Risk and appropriate interventions in the flowsheet.  Outcome: Progressing Towards Goal  Note:   Fall Risk Interventions:  Mobility Interventions: Patient to call before getting OOB        Medication Interventions: Patient to call before getting OOB    Elimination Interventions: Call light in reach         Bedside shift change report given to Camie RN (oncoming nurse) by Wilbern RN (offgoing nurse). Report included the following information SBAR, Kardex, Intake/Output, MAR, Accordion and Recent Results.   Electronically signed by Tanda Wilbern at 06/09/2017  7:57 AM EDT

## 2017-06-08 NOTE — Progress Notes (Signed)
 Formatting of this note might be different from the original.  Bedside shift change report given to Cecile, Charity fundraiser (Cabin crew) by Karna, RN (offgoing nurse). Report included the following information SBAR, Procedure Summary, Intake/Output, MAR, Med Rec Status and Cardiac Rhythm SR.   Electronically signed by Nancey Karna RAMAN at 06/08/2017  8:23 AM EDT

## 2017-06-08 NOTE — Unmapped (Signed)
 Formatting of this note might be different from the original.  Chart reviewed by Heart Failure Nurse Navigator.  Heart Failure database completed.     EF:  Echo pending    ACEi/ARB/ARNi: lisinopril 10 mg daily    BB: lopressor  50 mg twice a day    Aldosterone Antagonist: not indicated     Obstructive Sleep Apnea Screening:   STOP-BANG score:   Referred to Sleep Medicine:     CRT not currently indicated     NYHA Functional Class documentation requested      Heart Failure Teach Back in Patient Education.    Heart Failure Avoiding Triggers on Discharge Instructions.      Cardiologist: Dr. Gardenia ([facility])    Post discharge follow up phone call to be made within 48-72 hours of discharge.      *per Dr. Leanor note on 06/08/17 I assume her symptoms are due to PE and NOT CHF. Will stop lasix       Electronically signed by Bary Montie CROME, RN at 06/08/2017  2:54 PM EDT

## 2017-06-08 NOTE — Consults (Signed)
 Associated Order(s): IP CONSULT TO CARDIOLOGY  Formatting of this note is different from the original.  Images from the original note were not included.                                                                  Cardiology Consult Note    Patient Name: Allison Newman  DOB: 07/03/1940 MRN: [id number]  Date: 06/08/2017  Time: 12:48 PM    Admit Diagnosis: Acute CHF (congestive heart failure) (HCC) [I50.9]    Primary Cardiologist: Dr. Nadara, MD    Consulting Cardiologist: Vipal K. Gardenia, M.D.    Reason for Consult: claudette    Requesting MD: Dr. Anders, MD  Assessment/Plan/Discussion:Cardiology Attending:     Patient seen on the day of progress note and examined  and agree with Advance Practice Provider (APP, NP,PA)  assessment and plans.   Allison Newman is a 77 y.o. female   2015 evaluation for preop EKG , led to stress test and fixed defect with no angina  Since then no angina  Admit SOB, ProBNP really pretty close to [facility] for age  Found to have PE  Will await echo , if normal then no changes CV wise are needed  C/o cramp in leg, less SOB  Clear lungs normal HS and no edema, +very obese  She asked about cause of PE, thus far no id cause-can fu with PCP and Hospitalist/Internal Medicine team   Laquetta Gardenia, MD      HPI:  Allison Newman is a 77 y.o. female who has PMH of HTN, DM, CKD, COPD (not on home O2), arthritis, chronic pain, HLD, thyroid disease. presented on 06/07/2017  for chief c/o progressive worsening of SOB over 2-3 days.  O2 sats were 88% on RA in ER.  No chest pain but did experience some new burning in her chest on Wednesday when walking up stairs as well as worsened dyspnea.   No  dizziness, syncope. Did experience some palpitations.  No cough. Reports she has chronic LLE edema from extremity injury.  States prior to onset of SOB over past 2-3 days, she had no SOB at rest or with walking.  Denies any recent weight gain, in fact has been dieting and has lost 15 lbs since January.  Does not  exercise.  Workup included a CTA of chest which noted multiple bilateral PEs in both lungs.    Cardiology consulted for concerns for CHF.    Pt was last seen by Dr. Nadara in 2015 for pre-procedure (EGD) evaluation.  Had nuclear stress test done then which showed fixed inferior wall defect.  Reports LHC 16-18 years ago at [place] which she said showed no blockages and had no stents placed.      Subjective:  Denies chest pain, chest burning, SOB currently but says she has not been out of bed.  Denies dizziness, lightheadedness      Assessment and Plan     1. SOB/Acute hypoxic respiratory failure  -No SOB at rest currently  -CXR with no evidence of fluid overload  -Pro BNP 1164 (mildly elevated 900 cuttoff to age 21 then cuttoff is 1800 at age 73)  -Troponin neg x 2  -Echo pending.  -SOB due to bilateral PEs  But  will follow up echo    2. Acute bilateral PE  -Lovenox  1 mg/kg- transition to oral anticoagulation as per primary team    3 Hx of HTN: currently 133/91  -continue lisinopril, triamterene /HCTZ, lopressor  50 mg BID    4. Hx of abnormal stress test:  -Fixed inferior defect in 2015. No reversible ischemia.    -on ASA, statin.    -troponin neg. X 2. 12 lead EKG: NSR with nospecific t wave changes.  -No active chest pain.    5. Hx of DM: A1C 7.7    6. Hx of HLD: LDL 46.2  -Continue Crestor  20 mg daily    7. Morbid obesity Body mass index is 53.69 kg/m.    Pt presented with chief c/o progressive worsening of SOB. Noted to have bilateral PEs, no DVT.  Suspect SOB due to bilateral PEs but will follow up on echocardiogram.    Review of Symptoms:  Constitutional: Negative for fever, chills,  malaise/fatigue.   HEENT: Negative for dysphagia   Respiratory: Positive for SOB  Cardiovascular: Negative for chest pain, palpitations currently, negative, orthopnea, +leg swelling, negative syncope, and PND.   Gastrointestinal: Negative for nausea, vomiting, diarrhea, blood in stool and melena, abdominal pain    Genitourinary: Negative for hematuria.   Musculoskeletal: Negative for myalgias  Skin: Negative for rash.   Heme: Does not bleed or bruise easily.   Neurological: Negative for speech changes and focal weakness    Previous treatment/evaluation includes echocardiogram, cardiac catheterization and lexiscan stress .  Cardiac risk factors: smoking/ tobacco exposure, dyslipidemia, diabetes mellitus, obesity, sedentary life style, hypertension, post-menopausal.    Past Medical History:   Diagnosis Date   ? Abdominal pain 05/08/2013   ? Abnormal finding on EKG 11/17/2013   ? ACP (advance care planning) 05/03/2015   ? Arthritis    ? 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 Chronic pain of left knee 08/13/2014   ? Chronic right hip pain 03/17/2013   ? 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   ? Diabetes (HCC)    ? Elevated BUN 09/13/2011   ? Finger lesion 03/02/2016   ? Gallbladder polyp 04/08/2013   ? Gastrointestinal disorder     GERD   ? GERD (gastroesophageal reflux disease)    ? Hypercholesterolemia    ? Hypertension    ? 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 Microalbuminuria 08/29/2013   ? 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, adult (HCC) 08/28/2011   ? 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 On aspirin  at home 05/03/2015   ? Other unknown and unspecified cause of morbidity or mortality     hx of bronchitis   ? Rash, skin 09/25/2012   ? 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 Slow transit constipation 08/13/2014   ? Umbilical hernia 05/08/2013     Past Surgical History:   Procedure Laterality Date   ? 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   ? HX ORTHOPAEDIC  2000    screw in foot left   ? HX OTHER SURGICAL      colonoscopy   ? HX TUBAL LIGATION       Current Facility-Administered Medications   Medication Dose Route Frequency   ? aspirin  delayed-release tablet 81 mg  81 mg Oral DAILY    ? lisinopril (PRINIVIL, ZESTRIL) tablet 10 mg  10 mg Oral DAILY   ? methIMAzole  (TAPAZOLE ) tablet 5 mg  5 mg Oral DAILY   ? metoprolol  tartrate (LOPRESSOR ) tablet 50 mg  50 mg Oral BID   ? nystatin (MYCOSTATIN) 100,000 unit/gram powder   Topical QID   ? oxyCODONE-acetaminophen  (PERCOCET 10) 10-325 mg per tablet 1 Tab  1 Tab Oral Q6H PRN   ? rosuvastatin  (CRESTOR ) tablet 20 mg  20 mg Oral QHS   ? triamterene -hydroCHLOROthiazide  (MAXZIDE ) 37.5-25 mg per tablet 1 Tab  1 Tab Oral DAILY   ? sodium chloride  (NS) flush 5-40 mL  5-40 mL IntraVENous Q8H   ? sodium chloride  (NS) flush 5-40 mL  5-40 mL IntraVENous PRN   ? ondansetron  (ZOFRAN ) injection 4 mg  4 mg IntraVENous Q4H PRN   ? bisacodyl (DULCOLAX) tablet 5 mg  5 mg Oral DAILY PRN   ? insulin lispro (HUMALOG) injection   SubCUTAneous AC&HS   ? glucose chewable tablet 16 g  4 Tab Oral PRN   ? dextrose (D50W) injection syrg 12.5-25 g  12.5-25 g IntraVENous PRN   ? glucagon (GLUCAGEN) injection 1 mg  1 mg IntraMUSCular PRN   ? arformoterol  (BROVANA ) neb solution 15 mcg  15 mcg Nebulization BID RT    And   ? budesonide  (PULMICORT ) 500 mcg/2 ml nebulizer suspension  500 mcg Nebulization BID RT   ? enoxaparin  (LOVENOX ) injection 140 mg  140 mg SubCUTAneous Q12H   ? acetaminophen  (TYLENOL ) tablet 650 mg  650 mg Oral Q4H PRN   ? albuterol-ipratropium (DUO-NEB) 2.5 MG-0.5 MG/3 ML  3 mL Nebulization Q4H PRN     No Known Allergies   Family History   Problem Relation Age of Onset   ? Diabetes Mother    ? Heart Disease Mother         rheumatic fever   ? Breast Cancer Mother    ? Cancer Father         lung   ? Cancer Sister         colon & breast   ? Breast Cancer Sister    ? Cancer Brother         throat     Social History     Socioeconomic History   ? Marital status: DIVORCED     Spouse name: Not on file   ? Number of children: Not on file   ? Years of education: Not on file   ? Highest education level: Not on file   Tobacco Use   ? Smoking status: Former Smoker     Packs/day:  0.25     Years: 1.00     Pack years: 0.25     Types: Cigarettes     Last attempt to quit: 06/03/1991     Years since quitting: 26.0   ? Smokeless tobacco: Never Used   Substance and Sexual Activity   ? Alcohol use: No     Alcohol/week: 0.0 oz   ? Drug use: No   ? Sexual activity: Never     Objective:    Physical Exam    Vitals:   Vitals:    06/08/17 0048 06/08/17 0336 06/08/17 0928 06/08/17 1157   BP: 153/82 122/59 (!) 163/99 (!) 133/91   Pulse: 86 92 89 77   Resp: 20 22 22 24    Temp:  98.5 F (36.9 C) 98 F (36.7 C) 97.6 F (36.4 C)   SpO2: 93% 96%  96%   Weight:  312 lb 12.8 oz (  141.9 kg)       General:    Alert, cooperative, no distress, appears stated age.   Neck:   Supple,    Back:     Symmetric, .    Lungs:     clear to auscultation bilaterally.   Heart::    Regular rate and rhythm, S1, S2 normal, no murmur, click, rub or gallop.    Abdomen:     Soft, obese, non-tender. Bowel sounds present.   Extremities:  LLE trace edema   Vascular:   Pulses - 2+   Skin:   Skin color normal. No rashes or lesions   Neurologic:   CN II-XII grossly intact.      Telemetry: NSR  ECG: as above  Data Review:     Radiology: reviewed cta chest and cxr report  Recent Labs     06/08/17  0400 06/07/17  1954   CPK 115  --    TROIQ <0.05 <0.05     Recent Labs     06/08/17  0400 06/07/17  1954   NA 139 142   K 3.7 4.0   CL 107 109*   CO2 24 26   BUN 22* 24*   CREA 0.92 0.97   GLU 219* 161*   PHOS 3.8  --    CA 9.1 9.4     Recent Labs     06/08/17  0400 06/07/17  1954   WBC 6.6 7.3   HGB 12.7 13.1   HCT 41.0 42.2   PLT 128* 137*     Recent Labs     06/08/17  0400   SGOT 14*   AP 63     Recent Labs     06/08/17  0400   CHOL 116   LDLC 46.2     Recent Labs     06/08/17  0350   TSH 1.33     Thank you very much for this referral. I appreciate the opportunity to participate in this patient's care. I will follow along with above stated plan.    Olam HERO. Zimmer, NP       [facility]      [address], [address]      [place], [place]  [address]     [phone]    RR:Zoopnuu, Arty SAILOR, MD    Electronically signed by Gardenia Marina, MD at 06/08/2017  2:18 PM EDT

## 2017-06-08 NOTE — Progress Notes (Signed)
Reason for Admission:   Shortness of breath               RRAT Score:    29-Red-High              Resources/supports as identified by patient/family:   Patient lives at home with her 75 year old grandson. Patient has three sons who are supportive.                Top Challenges facing patient (as identified by patient/family and CM):                       Finances/Medication cost?      Patient has [employer] Medicare/[facility] Patient is able to afford medications.              Transportation?  Patient's family will assist.              Support system or lack thereof?  Three sons and a grandson who lives with her.                     Living arrangements?  Patient and her 75 year old grandson live together.              Self-care/ADLs/Cognition?  Patient reports that she is independent with self-care and ADLs. Patient has two canes and two walkers in which she uses to assist with ambulation. Patient is alert and oriented to all spheres.          Current Advanced Directive/Advance Care Plan:  No AMD on file. Patient is Full code status.                          Plan for utilizing home health:  No current home health needs.                        Likelihood of readmission: Moderate                 Transition of Care Plan:    The current plan is for patient to discharge home when medically ready.     Almira CoasterAshley N Charles-Bland, MS

## 2017-06-08 NOTE — Progress Notes (Signed)
Hospitalist Progress Note  Rulon Abide, MD  Answering service: [phone] OR [phone] from in house phone        Date of Service:  06/08/2017  NAME:  Allison Newman  DOB:  1940/11/20  MRN:  [id number]  PCP: Barbarann Ehlers, MD    Chief Complaint:   Chief Complaint   Patient presents with   ??? Shortness of Breath         Admission Summary:     Allison Newman is a 77 y.o. female who presented with SOB    Interval history / Subjective:   Patient seen for Follow up of PE  First encounter  Patient seen and examined by the bedside  Labs, images and notes reviewed  Patient is comfortable, denies chest pain, reports SOB x few days, which progressive. Patient lives a sedentary life style. Denies any abdominal pain, denies bleeding episodes, no NVD  SOB is improving  Doesn't use O2 at home  No fevers or chills  Discussed with nursing staff, no acute issues overnight, orders reviewed.     Assessment & Plan:     Acute hypoxemic respiratory failure sec to Bilateral PE, POA  O2 sat 88% on RA  DW Dr. Margarite Gouge from radiology, patient has bilateral PE on CTA chest  NIV pending  No obvious signs/symptoms of Pneumonia, no CHF, will DC Lasix  Lovenox 1 mg/Kg starting today, possible DC on OAC in am if stable  Tele bed  O2 support, wean as tolerated  Duoneb    Thrombocytopenia  No bleeding  Will monitor CBC in am    Questionable CHF  Echo pending  I assume her symptoms are due to PE and NOT CHF. 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  Weight loss recommended      Code status: Full Code    DVT prophylaxis: Full Code    Care Plan discussed with: Patient/Family, Nurse and Consultant Dr. Margarite Gouge fron Radiology    Disposition: TBD    Hospital Problems  Date Reviewed: 11-Jun-2017          Codes Class Noted POA     Acute hypoxemic respiratory failure (HCC) ICD-10-CM: J96.01  ICD-9-CM: 518.81  06/08/2017 Yes        * (Principal) Acute pulmonary embolism (HCC) ICD-10-CM: I26.99  ICD-9-CM: 415.19  06/08/2017 Yes        DM2 (diabetes mellitus, type 2) (HCC) (Chronic) ICD-10-CM: E11.9  ICD-9-CM: 250.00  06/08/2017 Yes        Hyperthyroidism (Chronic) ICD-10-CM: E05.90  ICD-9-CM: 242.90  06/08/2017 Yes        Essential hypertension ICD-10-CM: I10  ICD-9-CM: 401.9  08/28/2011 Yes        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, adult (HCC) (Chronic) ICD-10-CM: E66.01, Z68.44  ICD-9-CM: 278.01, V85.44  08/28/2011 Yes                Review of Systems:   A comprehensive review of systems was negative.         Physical Examination:       General appearance: alert, no distress, appears stated age  Head: Normocephalic, without obvious abnormality, atraumatic  Lungs: clear to auscultation bilaterally  Heart: regular rate and rhythm  Abdomen: soft, NT, NG, NR  Extremities: edema 1+ pitting BL LE  Skin: No rash  Neurologic: Grossly normal       Vital Signs:    Last 24hrs VS reviewed since prior progress note. Most recent are:    Visit Vitals  BP (!) 163/99 (BP 1 Location: Left arm, BP Patient Position: At rest)   Pulse 89   Temp 98 ??F (36.7 ??C)   Resp 22   Wt 141.9 kg (312 lb 12.8 oz)   SpO2 96%   BMI 53.69 kg/m??         Intake/Output Summary (Last 24 hours) at 06/08/2017 1149  Last data filed at 06/08/2017 0600  Gross per 24 hour   Intake 480 ml   Output 2500 ml   Net -2020 ml        Tmax:  Temp (24hrs), Avg:98.3 ??F (36.8 ??C), Min:98 ??F (36.7 ??C), Max:98.5 ??F (36.9 ??C)      Data Review:     Xr Chest Pa Lat    Result Date: 06/07/2017  CLINICAL HISTORY: Hypoxia INDICATION: Hypoxia COMPARISON: 10/27/2014 FINDINGS: PA and lateral views of the chest are obtained. The cardiopericardial silhouette is prominent. Diminished lung volumes. Pulmonary artery enlargement is suggested.. There is no pleural effusion, pneumothorax or focal consolidation present.      IMPRESSION: Mild cardiomegaly. Diminished lung volumes with pulmonary artery enlargement suggested.    No results found for: SDES  Lab Results   Component Value Date/Time    Culture result: MIXED SKIN FLORA ISOLATED 12/04/2012 04:45 PM     All Micro Results     Procedure Component Value Units Date/Time    URINE CULTURE HOLD SAMPLE [[id number]]     Order Status:  Sent Specimen:  Urine           Labs:     Recent Labs     06/08/17  0400 06/07/17  1954   WBC 6.6 7.3   HGB 12.7 13.1   HCT 41.0 42.2   PLT 128* 137*     Recent Labs     06/08/17  0400 06/07/17  1954   NA 139 142   K 3.7 4.0   CL 107 109*   CO2 24 26   BUN 22* 24*   CREA 0.92 0.97   GLU 219* 161*   CA 9.1 9.4   MG 1.9  --    PHOS 3.8  --      Recent Labs     06/08/17  0400   SGOT 14*   ALT 13   AP 63   TBILI 0.6   TP 7.1   ALB 3.0*   GLOB 4.1*     No results for input(s): INR, PTP, APTT in the last 72 hours.    No lab exists for component: INREXT, INREXT   No results for input(s): FE, TIBC, PSAT, FERR in the last 72 hours.   No results found for: FOL, RBCF   No results for input(s): PH, PCO2, PO2 in the last 72 hours.  Recent Labs     06/08/17  0400 06/07/17  1954   CPK 115  --    CKNDX 1.8  --    TROIQ <0.05 <0.05     Lab Results   Component Value Date/Time    Cholesterol, total 116 06/08/2017 04:00 AM    HDL Cholesterol 50 06/08/2017 04:00 AM    LDL, calculated 46.2 06/08/2017 04:00 AM    Triglyceride 99 06/08/2017 04:00 AM  CHOL/HDL Ratio 2.3 06/08/2017 04:00 AM     Lab Results   Component Value Date/Time    Glucose (POC) 181 (H) 06/08/2017 06:23 AM    Glucose (POC) 143 (H) 01/03/2008 09:21 AM    Glucose POC 139 04/24/2017 11:45 AM    Glucose POC 192 12/21/2016 11:52 AM     Lab Results   Component Value Date/Time    Color YELLOW/STRAW 12/04/2012 04:45 PM    Appearance CLOUDY (A) 12/04/2012 04:45 PM    Specific gravity 1.020 12/04/2012 04:45 PM    pH (UA) 5.5 12/04/2012 04:45 PM    Protein NEGATIVE  12/04/2012 04:45 PM     Glucose NEGATIVE  12/04/2012 04:45 PM    Ketone NEGATIVE  12/04/2012 04:45 PM    Bilirubin NEGATIVE  12/04/2012 04:45 PM    Urobilinogen 0.2 12/04/2012 04:45 PM    Nitrites NEGATIVE  12/04/2012 04:45 PM    Leukocyte Esterase SMALL (A) 12/04/2012 04:45 PM    Epithelial cells MODERATE (A) 12/04/2012 04:45 PM    Bacteria 1+ (A) 12/04/2012 04:45 PM    WBC 5-10 12/04/2012 04:45 PM    RBC 0-5 12/04/2012 04:45 PM         Medications Reviewed:     Current Facility-Administered Medications   Medication Dose Route Frequency   ??? aspirin delayed-release tablet 81 mg  81 mg Oral DAILY   ??? lisinopril (PRINIVIL, ZESTRIL) tablet 10 mg  10 mg Oral DAILY   ??? methIMAzole (TAPAZOLE) tablet 5 mg  5 mg Oral DAILY   ??? metoprolol tartrate (LOPRESSOR) tablet 50 mg  50 mg Oral BID   ??? nystatin (MYCOSTATIN) 100,000 unit/gram powder   Topical QID   ??? oxyCODONE-acetaminophen (PERCOCET 10) 10-325 mg per tablet 1 Tab  1 Tab Oral Q6H PRN   ??? rosuvastatin (CRESTOR) tablet 20 mg  20 mg Oral QHS   ??? triamterene-hydroCHLOROthiazide (MAXZIDE) 37.5-25 mg per tablet 1 Tab  1 Tab Oral DAILY   ??? sodium chloride (NS) flush 5-40 mL  5-40 mL IntraVENous Q8H   ??? sodium chloride (NS) flush 5-40 mL  5-40 mL IntraVENous PRN   ??? ondansetron (ZOFRAN) injection 4 mg  4 mg IntraVENous Q4H PRN   ??? bisacodyl (DULCOLAX) tablet 5 mg  5 mg Oral DAILY PRN   ??? insulin lispro (HUMALOG) injection   SubCUTAneous AC&HS   ??? glucose chewable tablet 16 g  4 Tab Oral PRN   ??? dextrose (D50W) injection syrg 12.5-25 g  12.5-25 g IntraVENous PRN   ??? glucagon (GLUCAGEN) injection 1 mg  1 mg IntraMUSCular PRN   ??? arformoterol (BROVANA) neb solution 15 mcg  15 mcg Nebulization BID RT    And   ??? budesonide (PULMICORT) 500 mcg/2 ml nebulizer suspension  500 mcg Nebulization BID RT   ??? enoxaparin (LOVENOX) injection 140 mg  1 mg/kg SubCUTAneous Q12H   ??? acetaminophen (TYLENOL) tablet 650 mg  650 mg Oral Q4H PRN   ??? albuterol-ipratropium (DUO-NEB) 2.5 MG-0.5 MG/3 ML  3 mL Nebulization  Q4H PRN     ______________________________________________________________________  EXPECTED LENGTH OF STAY: 2d 9h  ACTUAL LENGTH OF STAY:          1                 Rulon AbideZeeshan Abron Neddo, MD

## 2017-06-08 NOTE — H&P (Signed)
H&P dictated [id number]

## 2017-06-08 NOTE — Nurse Consult (Addendum)
Chart reviewed by Heart Failure Nurse Navigator.  Heart Failure database completed.     EF:  Echo pending    ACEi/ARB/ARNi: lisinopril 10 mg daily    BB: lopressor 50 mg twice a day    Aldosterone Antagonist: not indicated     Obstructive Sleep Apnea Screening:   STOP-BANG score:   Referred to Sleep Medicine:     CRT not currently indicated     NYHA Functional Class documentation requested      Heart Failure Teach Back in Patient Education.    Heart Failure Avoiding Triggers on Discharge Instructions.      Cardiologist: Dr. Gasper LloydSabharwal ([facility])    Post discharge follow up phone call to be made within 48-72 hours of discharge.      *per Dr. Clement SayresKhakwani note on 06/08/17 "I assume her symptoms are due to PE and NOT CHF. Will stop lasix"

## 2017-06-08 NOTE — Progress Notes (Addendum)
Bedside shift change report given to Jodelle GreenWhitley, Charity fundraiserN (Cabin crewoncoming nurse) by Lindie SpruceMeghan, RN (offgoing nurse). Report included the following information SBAR, Kardex, ED Summary, OR Summary, Procedure Summary, Intake/Output, MAR, Accordion, Recent Results, Med Rec Status, Cardiac Rhythm NSR, Alarm Parameters , Pre Procedure Checklist, Procedure Verification and Quality Measures.

## 2017-06-08 NOTE — Progress Notes (Signed)
NUTRITION     Chart reviewed.  Weight loss diet instruction completed; provided pt with handouts and outpatient dietitian contact information.  Will gladly follow up for additional questions as needed.  Thank you.     Myles RosenthalValerie W Lauren Modisette, RD

## 2017-06-08 NOTE — Progress Notes (Signed)
Pt de sat to 80's,positive for bil PE,on lovenox-  Bedside shift change report given to Rohm and HaasMegan RN (oncoming nurse) by C.BrenkeRN Physiological scientist(offgoing nurse). Report included the following information SBAR, Kardex, Procedure Summary, MAR and Recent Results.

## 2017-06-08 NOTE — Consults (Addendum)
Cardiology Consult Note  Patient Name: Allison Newman  DOB: 05-02-1940 MRN: [id number]  Date: 06/08/2017  Time: 12:48 PM    Admit Diagnosis: Acute CHF (congestive heart failure) (HCC) [I50.9]    Primary Cardiologist: Dr. Marval Regal, MD    Consulting Cardiologist: Jarel Cuadra K. Gasper Lloyd, M.D.    Reason for Consult: Dante Gang    Requesting MD: Dr. Lum Babe, MD  Assessment/Plan/Discussion:Cardiology Attending:     Patient seen on the day of progress note and examined  and agree with Advance Practice Provider (APP, NP,PA)  assessment and plans.   Allison Newman is a 77 y.o. female   2015 evaluation for preop EKG , led to stress test and fixed defect with no angina  Since then no angina  Admit SOB, ProBNP really pretty close to [facility] for age  Found to have PE  Will await echo , if normal then no changes CV wise are needed  C/o cramp in leg, less SOB  Clear lungs normal HS and no edema, +very obese  She asked about cause of PE, thus far no id cause-can fu with PCP and Hospitalist/Internal Medicine team   Mathews Robinsons, MD          HPI:  Allison Newman is a 77 y.o. female who has PMH of HTN, DM, CKD, COPD (not on home O2), arthritis, chronic pain, HLD, thyroid disease. presented on 06/07/2017  for chief c/o progressive worsening of SOB over 2-3 days.  O2 sats were 88% on RA in ER.  No chest pain but did experience some new burning in her chest on Wednesday when walking up stairs as well as worsened dyspnea.   No  dizziness, syncope. Did experience some palpitations.  No cough. Reports she has chronic LLE edema from extremity injury.  States prior to onset of SOB over past 2-3 days, she had no SOB at rest or with walking.  Denies any recent weight gain, in fact has been dieting and has lost 15 lbs since January.  Does not exercise.  Workup included a CTA of chest which noted multiple bilateral PEs in both lungs.    Cardiology consulted for concerns for CHF.     Pt was last seen by Dr. Marval Regal in 2015 for pre-procedure (EGD) evaluation.  Had nuclear stress test done then which showed fixed inferior wall defect.  Reports LHC 16-18 years ago at [place] which she said showed no blockages and had no stents placed.      Subjective:  Denies chest pain, chest burning, SOB currently but says she has not been out of bed.  Denies dizziness, lightheadedness      Assessment and Plan     1. SOB/Acute hypoxic respiratory failure  -No SOB at rest currently  -CXR with no evidence of fluid overload  -Pro BNP 1164 (mildly elevated 900 cuttoff to age 33 then cuttoff is 1800 at age 80)  -Troponin neg x 2  -Echo pending.  -SOB due to bilateral PEs  But will follow up echo    2. Acute bilateral PE  -Lovenox 1 mg/kg- transition to oral anticoagulation as per primary team    3 Hx of HTN: currently 133/91  -continue lisinopril, triamterene/HCTZ, lopressor 50 mg BID    4. Hx of abnormal stress test:  -Fixed inferior defect in 2015. No reversible ischemia.    -on ASA, statin.    -troponin neg. X 2. 12 lead EKG: NSR with nospecific t wave changes.  -No active chest pain.  5. Hx of DM: A1C 7.7    6. Hx of HLD: LDL 46.2  -Continue Crestor 20 mg daily    7. Morbid obesity Body mass index is 53.69 kg/m??.      Pt presented with chief c/o progressive worsening of SOB. Noted to have bilateral PEs, no DVT.  Suspect SOB due to bilateral PEs but will follow up on echocardiogram.    Review of Symptoms:  Constitutional: Negative for fever, chills,  malaise/fatigue.   HEENT: Negative for dysphagia   Respiratory: Positive for SOB  Cardiovascular: Negative for chest pain, palpitations currently, negative, orthopnea, +leg swelling, negative syncope, and PND.   Gastrointestinal: Negative for nausea, vomiting, diarrhea, blood in stool and melena, abdominal pain   Genitourinary: Negative for hematuria.   Musculoskeletal: Negative for myalgias  Skin: Negative for rash.   Heme: Does not bleed or bruise easily.    Neurological: Negative for speech changes and focal weakness      Previous treatment/evaluation includes echocardiogram, cardiac catheterization and lexiscan stress .  Cardiac risk factors: smoking/ tobacco exposure, dyslipidemia, diabetes mellitus, obesity, sedentary life style, hypertension, post-menopausal.    Past Medical History:   Diagnosis Date   ??? Abdominal pain 05/08/2013   ??? Abnormal finding on EKG 11/17/2013   ??? ACP (advance care planning) 05/03/2015   ??? Arthritis    ??? 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 Chronic pain of left knee 08/13/2014   ??? Chronic right hip pain 03/17/2013   ??? 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 Diabetes (HCC)    ??? Elevated BUN 09/13/2011   ??? Finger lesion 03/02/2016   ??? Gallbladder polyp 04/08/2013   ??? Gastrointestinal disorder     GERD   ??? GERD (gastroesophageal reflux disease)    ??? Hypercholesterolemia    ??? Hypertension    ??? 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 Microalbuminuria 08/29/2013   ??? 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 On aspirin at home 05/03/2015   ??? Other unknown and unspecified cause of morbidity or mortality     hx of bronchitis   ??? Rash, skin 09/25/2012   ??? 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 Slow transit constipation 08/13/2014   ??? Umbilical hernia 05/08/2013     Past Surgical History:   Procedure Laterality Date   ??? 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 HX ORTHOPAEDIC  2000    screw in foot left   ??? HX OTHER SURGICAL      colonoscopy   ??? HX TUBAL LIGATION       Current Facility-Administered Medications   Medication Dose Route Frequency   ??? aspirin delayed-release tablet 81 mg  81 mg Oral DAILY   ??? lisinopril (PRINIVIL, ZESTRIL) tablet 10 mg  10 mg Oral DAILY   ??? methIMAzole (TAPAZOLE) tablet 5 mg  5 mg Oral DAILY    ??? metoprolol tartrate (LOPRESSOR) tablet 50 mg  50 mg Oral BID   ??? nystatin (MYCOSTATIN) 100,000 unit/gram powder   Topical QID   ??? oxyCODONE-acetaminophen (PERCOCET 10) 10-325 mg per tablet 1 Tab  1 Tab Oral Q6H PRN   ??? rosuvastatin (CRESTOR) tablet 20 mg  20 mg Oral QHS   ??? triamterene-hydroCHLOROthiazide (MAXZIDE) 37.5-25 mg per tablet  1 Tab  1 Tab Oral DAILY   ??? sodium chloride (NS) flush 5-40 mL  5-40 mL IntraVENous Q8H   ??? sodium chloride (NS) flush 5-40 mL  5-40 mL IntraVENous PRN   ??? ondansetron (ZOFRAN) injection 4 mg  4 mg IntraVENous Q4H PRN   ??? bisacodyl (DULCOLAX) tablet 5 mg  5 mg Oral DAILY PRN   ??? insulin lispro (HUMALOG) injection   SubCUTAneous AC&HS   ??? glucose chewable tablet 16 g  4 Tab Oral PRN   ??? dextrose (D50W) injection syrg 12.5-25 g  12.5-25 g IntraVENous PRN   ??? glucagon (GLUCAGEN) injection 1 mg  1 mg IntraMUSCular PRN   ??? arformoterol (BROVANA) neb solution 15 mcg  15 mcg Nebulization BID RT    And   ??? budesonide (PULMICORT) 500 mcg/2 ml nebulizer suspension  500 mcg Nebulization BID RT   ??? enoxaparin (LOVENOX) injection 140 mg  140 mg SubCUTAneous Q12H   ??? acetaminophen (TYLENOL) tablet 650 mg  650 mg Oral Q4H PRN   ??? albuterol-ipratropium (DUO-NEB) 2.5 MG-0.5 MG/3 ML  3 mL Nebulization Q4H PRN       No Known Allergies   Family History   Problem Relation Age of Onset   ??? Diabetes Mother    ??? Heart Disease Mother         rheumatic fever   ??? Breast Cancer Mother    ??? Cancer Father         lung   ??? Cancer Sister         colon & breast   ??? Breast Cancer Sister    ??? Cancer Brother         throat      Social History     Socioeconomic History   ??? Marital status: DIVORCED     Spouse name: Not on file   ??? Number of children: Not on file   ??? Years of education: Not on file   ??? Highest education level: Not on file   Tobacco Use   ??? Smoking status: Former Smoker     Packs/day: 0.25     Years: 1.00     Pack years: 0.25     Types: Cigarettes     Last attempt to quit: 06/03/1991      Years since quitting: 26.0   ??? Smokeless tobacco: Never Used   Substance and Sexual Activity   ??? Alcohol use: No     Alcohol/week: 0.0 oz   ??? Drug use: No   ??? Sexual activity: Never       Objective:    Physical Exam    Vitals:   Vitals:    06/08/17 0048 06/08/17 0336 06/08/17 0928 06/08/17 1157   BP: 153/82 122/59 (!) 163/99 (!) 133/91   Pulse: 86 92 89 77   Resp: 20 22 22 24    Temp:  98.5 ??F (36.9 ??C) 98 ??F (36.7 ??C) 97.6 ??F (36.4 ??C)   SpO2: 93% 96%  96%   Weight:  312 lb 12.8 oz (141.9 kg)         General:    Alert, cooperative, no distress, appears stated age.   Neck:   Supple,    Back:     Symmetric, .    Lungs:     clear to auscultation bilaterally.   Heart::    Regular rate and rhythm, S1, S2 normal, no murmur, click, rub or gallop.     Abdomen:     Soft,  obese, non-tender. Bowel sounds present.   Extremities:  LLE trace edema   Vascular:   Pulses - 2+   Skin:   Skin color normal. No rashes or lesions   Neurologic:   CN II-XII grossly intact.        Telemetry: NSR  ECG: as above  Data Review:     Radiology: reviewed cta chest and cxr report  Recent Labs     06/08/17  0400 06/07/17  1954   CPK 115  --    TROIQ <0.05 <0.05     Recent Labs     06/08/17  0400 06/07/17  1954   NA 139 142   K 3.7 4.0   CL 107 109*   CO2 24 26   BUN 22* 24*   CREA 0.92 0.97   GLU 219* 161*   PHOS 3.8  --    CA 9.1 9.4     Recent Labs     06/08/17  0400 06/07/17  1954   WBC 6.6 7.3   HGB 12.7 13.1   HCT 41.0 42.2   PLT 128* 137*     Recent Labs     06/08/17  0400   SGOT 14*   AP 63     Recent Labs     06/08/17  0400   CHOL 116   LDLC 46.2     Recent Labs     06/08/17  0350   TSH 1.33       Thank you very much for this referral. I appreciate the opportunity to participate in this patient's care. I will follow along with above stated plan.    Rosezella Florida. Zimmer, NP         [facility] [place]     [address], [address]     [place], [place] [address]     [phone]    NW:[id number], Deliah Goody, MD

## 2017-06-08 NOTE — Progress Notes (Signed)
Bedside shift change report given to Cecile, Charity fundraiserN (Cabin crewoncoming nurse) by Angelique Blonderenise, RN (offgoing nurse). Report included the following information SBAR, Procedure Summary, Intake/Output, MAR, Med Rec Status and Cardiac Rhythm SR.

## 2017-06-08 NOTE — H&P (Signed)
[facility]  HISTORY AND PHYSICAL    Name:  Allison Newman, PHA  MR#:  [id number]  DOB:  28-Jan-1941  ACCOUNT #:  [id number]  ADMIT DATE:  06/07/2017    PRIMARY CARE PHYSICIAN:  Otila Kluver, MD    SOURCE OF INFORMATION:  The patient.    CHIEF COMPLAINT:  Shortness of breath.    HISTORY OF PRESENT ILLNESS:  This is a 77 year old woman with a past medical history significant for COPD, hypertension, type 2 diabetes, hypothyroidism, dyslipidemia, was in her usual state of health until few days ago when the patient developed shortness of breath which is progressive and getting worse.  The shortness of breath is with very minimal exertion such as climbing 2-3 steps at home.  The shortness of breath is also associated with chest pain, which is located at the center of the chest.  The patient described the chest pain as burning sensation with no radiation.  No known aggravating or relieving factors, 5/10 in severity.  The patient denies history of congestive heart failure.  No history of coronary artery disease.  The shortness of breath is worse when the patient is lying down.  No associated cough.  No fever, no rigors, no chills.  The patient came to the emergency room for further evaluation of these symptoms.  The patient has no record of prior admission to this hospital.  When the patient arrived at the emergency room, the patient was found to have an elevated BNP level.  Chest x-ray shows pulmonary artery enlargement.  The patient received Lasix and was referred to the hospitalist service for evaluation for admission.    PAST MEDICAL HISTORY:  1.  COPD.  2.  Hypertension.  3.  Type 2 diabetes.  4.  Hyperthyroidism.  5.  Dyslipidemia.    ALLERGIES:  NO KNOWN DRUG ALLERGIES.    MEDICATIONS:  1.  Aspirin 81 mg daily.  2.  Breo inhalation daily.  3.  Calcium 600 mg 1 tablet twice daily.  4.  Janumet 50/500 1 tablet daily.  5.  Lisinopril 10 mg daily.  6.  Glucophage 1000 mg twice daily.   7.  Tapazole 5 mg daily.  8.  Lopressor 50 mg twice daily.  9.  Percocet 10/325, half-tablet daily as needed for pain.  10.  Crestor 20 mg daily.  11.  Dyazide 37.5/25, one capsule daily.    FAMILY HISTORY:  This was reviewed.  Her mother had diabetes, rheumatic heart disease as well as breast cancer.  Her father had lung cancer.    PAST SURGICAL HISTORY:  This is significant for right knee replacement, left foot surgery.    SOCIAL HISTORY:  The patient is a former smoker, quit tobacco abuse in March 1993.  No history of alcohol abuse.    REVIEW OF SYSTEMS:  HEAD, EYES, EARS, NOSE AND THROAT:  No headache, no dizziness, no blurring of vision.  No photophobia.  RESPIRATORY SYSTEM:  This is positive for shortness of breath.  No cough, no hemoptysis.  CARDIOVASCULAR SYSTEM:  This is positive for chest pain, orthopnea, no palpitations.  GASTROINTESTINAL SYSTEM:  No nausea or vomiting.  No diarrhea.  No constipation.  GENITOURINARY SYSTEM:  No dysuria, no urgency, and no frequency.    All other systems are reviewed and they are negative.    PHYSICAL EXAMINATION:  GENERAL APPEARANCE:  The patient appeared ill, in moderate distress.  VITAL SIGNS:  On arrival at the emergency room, temperature 98.2, pulse rate  91, respiratory rate 16, blood pressure 146/74, oxygen saturation 90% on room air.  HEAD:  Normocephalic, atraumatic.  EYES:  Normal eye movements.  No redness, no drainage, no discharge.  EARS:  Normal external ears with no evidence of drainage.  NOSE:  No deformity and no drainage.  MOUTH AND THROAT:  No visible oral lesion.  NECK:  Neck is supple.  Mild JVD.  No thyromegaly.  CHEST:  Bilateral basilar crackles and very few expiratory wheezing.  HEART:  Normal S1 and S2, regular.  No clinically appreciable murmur.  ABDOMEN:  Soft, obese, nontender.  Normal bowel sounds.  CNS:  Alert, oriented x3.  No gross focal neurological deficit.  EXTREMITIES:  Edema 2+.  Pulses 2+ bilaterally.   MUSCULOSKELETAL SYSTEM:  No evidence of joint deformity and swelling.  SKIN:  No active skin lesions seen in the exposed part of the body.  PSYCHIATRIC:  Normal mood and affect.  LYMPHATIC SYSTEM:  No cervical lymphadenopathy.    DIAGNOSTIC DATA:  EKG shows normal sinus rhythm and nonspecific T-wave abnormalities.  Chest x-ray shows mild cardiomegaly, diminished lung volumes with pulmonary artery enlargement.    LABORATORY DATA:  Hematology:  WBC 7.3, hemoglobin at 13.1, hematocrit 42.2, platelets 137.  Cardiac Profile:  Troponin less than 0.05.  Pro-BNP 1164.  Chemistry:  Sodium 142, potassium 4.0, chloride 109, CO2 26, glucose 161, BUN 24, creatinine 0.97, calcium 9.4.    ASSESSMENT:  1.  Acute congestive heart failure.  2.  Chronic obstructive pulmonary disease.  3.  Hypertension.  4.  Type 2 diabetes.  5.  Hyperthyroidism.  6.  Dyslipidemia.  7.  Morbid obesity.  8.  Bilateral leg swelling.  9.  Thrombocytopenia.    PLAN:  1.  Acute congestive heart failure.  We will admit the patient for further evaluation and treatment.  This is most likely diastolic dysfunction, congestive heart failure from longstanding hypertension, but we will obtain echocardiogram and await the results of echocardiogram to determine the ejection fraction and the type of congestive heart failure.  We will start the patient on Lasix.  We will check serial cardiac markers to rule out acute myocardial infarction as a possible cause of the acute congestive heart failure.  Cardiology consult will be requested to assist in further evaluation and treatment.  This is the most likely cause of the patient's shortness of breath.  We will obtain a CTA of the chest to evaluate the patient for pulmonary embolism as a possible cause of shortness of breath.  2.  COPD.  The patient has minimal wheezing on physical exam.  We will place the patient on DuoNeb.  We will also resume preadmission medication.   3.  Hypertension.  We will continue with preadmission medication.  We will monitor the patient's blood pressure closely.  4.  Type 2 diabetes.  The patient will be placed on sliding scale with insulin coverage.  We will check hemoglobin A1c level, if one has not been checked recently.  We will hold oral agent until the time of discharge from the hospital.  5.  Hyperthyroidism.  We will continue with preadmission medication.  We will check a TSH level.  6.  Dyslipidemia.  We will continue with preadmission medication.  We will check a lipid panel.  7.  Morbid obesity.  Dietary consult will be requested for dietary counseling.  We will check a TSH level, the obesity is unspecified.  8.  Bilateral leg swelling.  This is most likely as  a result of the acute congestive heart failure.  We will check ultrasound of the lower extremity for DVT.  9.  Thrombocytopenia.  This is mild.  The patient is asymptomatic.  We will monitor the patient's platelet count.  10.  Other issues.  Code status:  The patient is a full code.  We will place the patient on Lovenox for DVT prophylaxis, but if there is a further drop in the patient's platelet count, we will discontinue Lovenox and request SCD for DVT prophylaxis.    FUNCTIONAL STATUS PRIOR TO ADMISSION:  The patient is ambulatory without assistant or device.        Noralyn Pick, MD      [id number]  D:  06/08/2017 4:24  T:  06/08/2017 4:31  JOB #:  [id number]  CC:  Otila Kluver, MD

## 2017-06-09 LAB — CBC WITH AUTOMATED DIFF
ABS. BASOPHILS: 0 10*3/uL (ref 0.0–0.1)
ABS. EOSINOPHILS: 0.2 10*3/uL (ref 0.0–0.4)
ABS. IMM. GRANS.: 0 10*3/uL (ref 0.00–0.04)
ABS. LYMPHOCYTES: 2.2 10*3/uL (ref 0.8–3.5)
ABS. MONOCYTES: 0.7 10*3/uL (ref 0.0–1.0)
ABS. NEUTROPHILS: 3.7 10*3/uL (ref 1.8–8.0)
ABSOLUTE NRBC: 0 10*3/uL (ref 0.00–0.01)
BASOPHILS: 1 % (ref 0–1)
EOSINOPHILS: 3 % (ref 0–7)
HCT: 41.9 % (ref 35.0–47.0)
HGB: 12.9 g/dL (ref 11.5–16.0)
IMMATURE GRANULOCYTES: 0 % (ref 0.0–0.5)
LYMPHOCYTES: 32 % (ref 12–49)
MCH: 28.5 PG (ref 26.0–34.0)
MCHC: 30.8 g/dL (ref 30.0–36.5)
MCV: 92.7 FL (ref 80.0–99.0)
MONOCYTES: 11 % (ref 5–13)
MPV: 12.5 FL (ref 8.9–12.9)
NEUTROPHILS: 53 % (ref 32–75)
NRBC: 0 PER 100 WBC
PLATELET: 130 10*3/uL — ABNORMAL LOW (ref 150–400)
RBC: 4.52 M/uL (ref 3.80–5.20)
RDW: 14.1 % (ref 11.5–14.5)
WBC: 6.8 10*3/uL (ref 3.6–11.0)

## 2017-06-09 LAB — METABOLIC PANEL, BASIC
Anion gap: 5 mmol/L (ref 5–15)
BUN/Creatinine ratio: 27 — ABNORMAL HIGH (ref 12–20)
BUN: 26 MG/DL — ABNORMAL HIGH (ref 6–20)
CO2: 27 mmol/L (ref 21–32)
Calcium: 8.7 MG/DL (ref 8.5–10.1)
Chloride: 103 mmol/L (ref 97–108)
Creatinine: 0.95 MG/DL (ref 0.55–1.02)
GFR est AA: >60 mL/min/{1.73_m2} (ref >60)
GFR est non-AA: 58 mL/min/{1.73_m2} — ABNORMAL LOW (ref >60)
Glucose: 178 mg/dL — ABNORMAL HIGH (ref 65–100)
Potassium: 3.8 mmol/L (ref 3.5–5.1)
Sodium: 135 mmol/L — ABNORMAL LOW (ref 136–145)

## 2017-06-09 LAB — GLUCOSE, POC
Glucose (POC): 219 mg/dL — ABNORMAL HIGH (ref 65–100)
Glucose (POC): 182 mg/dL — ABNORMAL HIGH (ref 65–100)
Glucose (POC): 189 mg/dL — ABNORMAL HIGH (ref 65–100)
Glucose (POC): 162 mg/dL — ABNORMAL HIGH (ref 65–100)

## 2017-06-09 LAB — T4, FREE: T4, Free: 1.2 NG/DL (ref 0.8–1.5)

## 2017-06-09 MED ORDER — ENOXAPARIN 150 MG/ML SUB-Q SYRINGE
150 mg/mL | Freq: Two times a day (BID) | SUBCUTANEOUS | Status: DC
Start: 2017-06-09 — End: 2017-06-10
  Administered 2017-06-09 – 2017-06-10 (×2): via SUBCUTANEOUS

## 2017-06-09 MED ORDER — SENNOSIDES-DOCUSATE SODIUM 8.6 MG-50 MG TAB
Freq: Every day | ORAL | Status: DC
Start: 2017-06-09 — End: 2017-06-11
  Administered 2017-06-09 – 2017-06-11 (×3): via ORAL

## 2017-06-09 MED FILL — METOPROLOL TARTRATE 25 MG TAB: 25 mg | ORAL | Qty: 2

## 2017-06-09 MED FILL — BUDESONIDE 0.5 MG/2 ML NEB SUSPENSION: 0.5 mg/2 mL | RESPIRATORY_TRACT | Qty: 1

## 2017-06-09 MED FILL — LOVENOX 150 MG/ML SUBCUTANEOUS SYRINGE: 150 mg/mL | SUBCUTANEOUS | Qty: 1

## 2017-06-09 MED FILL — OXYCODONE-ACETAMINOPHEN 10 MG-325 MG TAB: 10-325 mg | ORAL | Qty: 1

## 2017-06-09 MED FILL — ROSUVASTATIN 10 MG TAB: 10 mg | ORAL | Qty: 2

## 2017-06-09 MED FILL — LOVENOX 150 MG/ML SUBCUTANEOUS SYRINGE: 150 mg/mL | SUBCUTANEOUS | Qty: 0.9

## 2017-06-09 MED FILL — INSULIN LISPRO 100 UNIT/ML INJECTION: 100 unit/mL | SUBCUTANEOUS | Qty: 1

## 2017-06-09 MED FILL — ASPIRIN 81 MG TAB, DELAYED RELEASE: 81 mg | ORAL | Qty: 1

## 2017-06-09 MED FILL — BROVANA 15 MCG/2 ML SOLUTION FOR NEBULIZATION: 15 mcg/2 mL | RESPIRATORY_TRACT | Qty: 1

## 2017-06-09 MED FILL — TRIAMTERENE-HYDROCHLOROTHIAZIDE 37.5 MG-25 MG TAB: ORAL | Qty: 1

## 2017-06-09 MED FILL — NORMAL SALINE FLUSH 0.9 % INJECTION SYRINGE: INTRAMUSCULAR | Qty: 10

## 2017-06-09 MED FILL — NYSTOP 100,000 UNIT/GRAM TOPICAL POWDER: 100000 unit/gram | CUTANEOUS | Qty: 15

## 2017-06-09 MED FILL — LISINOPRIL 10 MG TAB: 10 mg | ORAL | Qty: 1

## 2017-06-09 MED FILL — SENOKOT-S 8.6 MG-50 MG TABLET: ORAL | Qty: 1

## 2017-06-09 MED FILL — LOVENOX 150 MG/ML SUBCUTANEOUS SYRINGE: 150 mg/mL | SUBCUTANEOUS | Qty: 0.93

## 2017-06-09 MED FILL — METHIMAZOLE 5 MG TAB: 5 mg | ORAL | Qty: 1

## 2017-06-09 NOTE — Progress Notes (Addendum)
Problem: Pulmonary Embolism Care Plan (Adult)  Goal: *Improvement of existing pulmonary embolism  Outcome: Progressing Towards Goal   Pt on 4L NC at start of day, pt baseline is room air. Pt O2 weaned to 2L NC, pt tolerating well, will continue to wean as tolerated. Pt OOB to chair this morning and using bedside commode. Pt encouraged to ambulate throughout the day and work to getting into the bathroom. Pt given incentive spirometer and instructed on use, pt tolerated well. Will continue to wean O2 as tolerated and encourage ambulation throughout the day. PT and OT consulted for potential needs at home. Dr. Clement SayresKhakwani spoke with patient regarding discharge with Eliquis, pt given handout for Eliquis and coupon per MD request. Will continue to monitor and educate.    Bedside shift change report given to Jodelle GreenWhitley RN (oncoming nurse) by Lubertha BasqueSara RN (offgoing nurse). Report included the following information SBAR, Kardex, ED Summary, Procedure Summary, Intake/Output, MAR, Recent Results, Med Rec Status, Cardiac Rhythm NSR, Alarm Parameters  and Quality Measures.

## 2017-06-09 NOTE — Progress Notes (Signed)
Hospitalist Progress Note  Rulon Abide, MD  Answering service: [phone] OR [phone] from in house phone        Date of Service:  06/09/2017  NAME:  Allison Newman  DOB:  07/24/1940  MRN:  [id number]  PCP: Barbarann Ehlers, MD    Chief Complaint:   Chief Complaint   Patient presents with   ??? Shortness of Breath         Admission Summary:     Allison Newman is a 77 y.o. female who presented with SOB    Interval history / Subjective:   Patient seen for Follow up of PE    Patient seen and examined by the bedside  Labs, images and notes reviewed  Pt feels better, SOB is improving  Afebrile  No NVD  No bleeding  No chest pain  Patient encouraged to abulate and lose weight  Discussed with nursing staff, no acute issues overnight, orders reviewed.     Assessment & Plan:     Acute hypoxemic respiratory failure sec to Bilateral PE, POA  O2 sat 88% on RA  DW Dr. Margarite Gouge from radiology, patient has bilateral PE on CTA chest  NIV neg for DVT but limited studies technically.  No obvious signs/symptoms of Pneumonia, no CHF, will DC Lasix  Lovenox 1 mg/Kg starting today, will DC on OAC once able.  Tele bed  O2 support, wean as tolerated  Duoneb    Thrombocytopenia  No bleeding  Will monitor CBC in am    Questionable CHF  Echo shows normal LVEF, grade 1 diastolic dysfucntion  I assume her symptoms are due to PE and NOT CHF. 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 Margarite Gouge fron Radiology     Disposition: TBD    Hospital Problems  Date Reviewed: 06/28/2017          Codes Class Noted POA    Acute hypoxemic respiratory failure (HCC) ICD-10-CM: J96.01  ICD-9-CM: 518.81  06/08/2017 Yes        * (Principal) Acute pulmonary embolism (HCC) ICD-10-CM: I26.99  ICD-9-CM: 415.19  06/08/2017 Yes        DM2 (diabetes mellitus, type 2) (HCC) (Chronic) ICD-10-CM: E11.9  ICD-9-CM: 250.00  06/08/2017 Yes        Hyperthyroidism (Chronic) ICD-10-CM: E05.90  ICD-9-CM: 242.90  06/08/2017 Yes        Essential hypertension ICD-10-CM: I10  ICD-9-CM: 401.9  08/28/2011 Yes        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, adult (HCC) (Chronic) ICD-10-CM: E66.01, Z68.44  ICD-9-CM: 278.01, V85.44  08/28/2011 Yes                Review of Systems:   A comprehensive review of systems was negative.         Physical Examination:  General appearance: alert, no distress, appears stated age  Head: Normocephalic, without obvious abnormality, atraumatic  Lungs: clear to auscultation bilaterally  Heart: regular rate and rhythm  Abdomen: soft, NT, NG, NR  Extremities: no edema  Skin: No rash  Neurologic: Grossly normal       Vital Signs:    Last 24hrs VS reviewed since prior progress note. Most recent are:    Visit Vitals  BP 116/56 (BP 1 Location: Left arm, BP Patient Position: At rest)   Pulse 75   Temp 98.2 ??F (36.8 ??C)   Resp 19   Ht 5\' 4"  (1.626 m)   Wt 136.4 kg (300 lb 11.3 oz)   SpO2 96%   BMI 51.62 kg/m??         Intake/Output Summary (Last 24 hours) at 06/09/2017 0822  Last data filed at 06/09/2017 0807  Gross per 24 hour   Intake 240 ml   Output 1900 ml   Net -1660 ml        Tmax:  Temp (24hrs), Avg:97.9 ??F (36.6 ??C), Min:97.6 ??F (36.4 ??C), Max:98.2 ??F (36.8 ??C)      Data Review:     Xr Chest Pa Lat    Result Date: 06/07/2017  CLINICAL HISTORY: Hypoxia INDICATION: Hypoxia COMPARISON: 10/27/2014 FINDINGS: PA and lateral views of the chest are obtained. The cardiopericardial silhouette is prominent. Diminished lung volumes.  Pulmonary artery enlargement is suggested.. There is no pleural effusion, pneumothorax or focal consolidation present.     IMPRESSION: Mild cardiomegaly. Diminished lung volumes with pulmonary artery enlargement suggested.    No results found for: SDES  Lab Results   Component Value Date/Time    Culture result: MIXED SKIN FLORA ISOLATED 12/04/2012 04:45 PM     All Micro Results     Procedure Component Value Units Date/Time    URINE CULTURE HOLD SAMPLE [[id number]]     Order Status:  Sent Specimen:  Urine           Labs:     Recent Labs     06/09/17  0346 06/08/17  0400   WBC 6.8 6.6   HGB 12.9 12.7   HCT 41.9 41.0   PLT 130* 128*     Recent Labs     06/09/17  0346 06/08/17  0400 06/07/17  1954   NA 135* 139 142   K 3.8 3.7 4.0   CL 103 107 109*   CO2 27 24 26    BUN 26* 22* 24*   CREA 0.95 0.92 0.97   GLU 178* 219* 161*   CA 8.7 9.1 9.4   MG  --  1.9  --    PHOS  --  3.8  --      Recent Labs     06/08/17  0400   SGOT 14*   ALT 13   AP 63   TBILI 0.6   TP 7.1   ALB 3.0*   GLOB 4.1*     No results for input(s): INR, PTP, APTT in the last 72 hours.    No lab exists for component: INREXT, INREXT   No results for input(s): FE, TIBC, PSAT, FERR in the last 72 hours.   No results found for: FOL, RBCF   No results for input(s): PH, PCO2, PO2 in the last 72 hours.  Recent Labs     06/08/17  0400 06/07/17  1954   CPK 115  --    CKNDX 1.8  --    TROIQ <  0.05 <0.05     Lab Results   Component Value Date/Time    Cholesterol, total 116 06/08/2017 04:00 AM    HDL Cholesterol 50 06/08/2017 04:00 AM    LDL, calculated 46.2 06/08/2017 04:00 AM    Triglyceride 99 06/08/2017 04:00 AM    CHOL/HDL Ratio 2.3 06/08/2017 04:00 AM     Lab Results   Component Value Date/Time    Glucose (POC) 182 (H) 06/09/2017 06:34 AM    Glucose (POC) 219 (H) 06/08/2017 09:13 PM    Glucose (POC) 169 (H) 06/08/2017 04:54 PM    Glucose (POC) 179 (H) 06/08/2017 11:46 AM    Glucose (POC) 181 (H) 06/08/2017 06:23 AM     Lab Results   Component Value Date/Time     Color YELLOW/STRAW 12/04/2012 04:45 PM    Appearance CLOUDY (A) 12/04/2012 04:45 PM    Specific gravity 1.020 12/04/2012 04:45 PM    pH (UA) 5.5 12/04/2012 04:45 PM    Protein NEGATIVE  12/04/2012 04:45 PM    Glucose NEGATIVE  12/04/2012 04:45 PM    Ketone NEGATIVE  12/04/2012 04:45 PM    Bilirubin NEGATIVE  12/04/2012 04:45 PM    Urobilinogen 0.2 12/04/2012 04:45 PM    Nitrites NEGATIVE  12/04/2012 04:45 PM    Leukocyte Esterase SMALL (A) 12/04/2012 04:45 PM    Epithelial cells MODERATE (A) 12/04/2012 04:45 PM    Bacteria 1+ (A) 12/04/2012 04:45 PM    WBC 5-10 12/04/2012 04:45 PM    RBC 0-5 12/04/2012 04:45 PM         Medications Reviewed:     Current Facility-Administered Medications   Medication Dose Route Frequency   ??? aspirin delayed-release tablet 81 mg  81 mg Oral DAILY   ??? lisinopril (PRINIVIL, ZESTRIL) tablet 10 mg  10 mg Oral DAILY   ??? methIMAzole (TAPAZOLE) tablet 5 mg  5 mg Oral DAILY   ??? metoprolol tartrate (LOPRESSOR) tablet 50 mg  50 mg Oral BID   ??? nystatin (MYCOSTATIN) 100,000 unit/gram powder   Topical QID   ??? oxyCODONE-acetaminophen (PERCOCET 10) 10-325 mg per tablet 1 Tab  1 Tab Oral Q6H PRN   ??? rosuvastatin (CRESTOR) tablet 20 mg  20 mg Oral QHS   ??? triamterene-hydroCHLOROthiazide (MAXZIDE) 37.5-25 mg per tablet 1 Tab  1 Tab Oral DAILY   ??? sodium chloride (NS) flush 5-40 mL  5-40 mL IntraVENous Q8H   ??? sodium chloride (NS) flush 5-40 mL  5-40 mL IntraVENous PRN   ??? ondansetron (ZOFRAN) injection 4 mg  4 mg IntraVENous Q4H PRN   ??? bisacodyl (DULCOLAX) tablet 5 mg  5 mg Oral DAILY PRN   ??? insulin lispro (HUMALOG) injection   SubCUTAneous AC&HS   ??? glucose chewable tablet 16 g  4 Tab Oral PRN   ??? dextrose (D50W) injection syrg 12.5-25 g  12.5-25 g IntraVENous PRN   ??? glucagon (GLUCAGEN) injection 1 mg  1 mg IntraMUSCular PRN   ??? arformoterol (BROVANA) neb solution 15 mcg  15 mcg Nebulization BID RT    And   ??? budesonide (PULMICORT) 500 mcg/2 ml nebulizer suspension  500 mcg Nebulization BID RT    ??? enoxaparin (LOVENOX) injection 140 mg  140 mg SubCUTAneous Q12H   ??? acetaminophen (TYLENOL) tablet 650 mg  650 mg Oral Q4H PRN   ??? albuterol-ipratropium (DUO-NEB) 2.5 MG-0.5 MG/3 ML  3 mL Nebulization Q4H PRN     ______________________________________________________________________  EXPECTED LENGTH OF STAY: 2d 9h  ACTUAL LENGTH OF  STAY:          2                 Rulon AbideZeeshan Devinn Hurwitz, MD

## 2017-06-09 NOTE — Progress Notes (Signed)
 Formatting of this note might be different from the original.    Problem: Falls - Risk of  Goal: *Absence of Falls  Description  Document Deloris Fall Risk and appropriate interventions in the flowsheet.  Outcome: Progressing Towards Goal   Patient calls for assistance when needed. Hourly rounds performed.     Bedside shift change report given to Camie RN (oncoming nurse) by Wilbern PEAK (offgoing nurse). Report included the following information SBAR, Kardex, Intake/Output, MAR, Accordion and Recent Results.   Electronically signed by Tanda Wilbern at 06/10/2017  7:15 AM EDT

## 2017-06-09 NOTE — Progress Notes (Signed)
 Formatting of this note might be different from the original.  Problem: Pulmonary Embolism Care Plan (Adult)  Goal: *Improvement of existing pulmonary embolism  Outcome: Progressing Towards Goal   Pt on 4L NC at start of day, pt baseline is room air. Pt O2 weaned to 2L NC, pt tolerating well, will continue to wean as tolerated. Pt OOB to chair this morning and using bedside commode. Pt encouraged to ambulate throughout the day and work to getting into the bathroom. Pt given incentive spirometer and instructed on use, pt tolerated well. Will continue to wean O2 as tolerated and encourage ambulation throughout the day. PT and OT consulted for potential needs at home. Dr. Leanor spoke with patient regarding discharge with Eliquis, pt given handout for Eliquis and coupon per MD request. Will continue to monitor and educate.    Bedside shift change report given to Wilbern RN (oncoming nurse) by Camie PEAK (offgoing nurse). Report included the following information SBAR, Kardex, ED Summary, Procedure Summary, Intake/Output, MAR, Recent Results, Med Rec Status, Cardiac Rhythm NSR, Alarm Parameters  and Quality Measures.     Electronically signed by Raymond Camie LABOR., RN at 06/09/2017  6:52 PM EDT

## 2017-06-09 NOTE — Progress Notes (Signed)
 Formatting of this note is different from the original.  Images from the original note were not included.          Hospitalist Progress Note  Torrie Rao, MD  Answering service: [phone] OR [phone] from in house phone      Date of Service:  06/09/2017  NAME:  Allison Newman  DOB:  05/29/1940  MRN:  [id number]  PCP: Viktoria Arty SAILOR, MD    Chief Complaint:   Chief Complaint   Patient presents with   ? Shortness of Breath     Admission Summary:     Allison Newman is a 77 y.o. female who presented with SOB    Interval history / Subjective:   Patient seen for Follow up of PE    Patient seen and examined by the bedside  Labs, images and notes reviewed  Pt feels better, SOB is improving  Afebrile  No NVD  No bleeding  No chest pain  Patient encouraged to abulate and lose weight  Discussed with nursing staff, no acute issues overnight, orders reviewed.     Assessment & Plan:     Acute hypoxemic respiratory failure sec to Bilateral PE, POA  O2 sat 88% on RA  DW Dr. Orelia from radiology, patient has bilateral PE on CTA chest  NIV neg for DVT but limited studies technically.  No obvious signs/symptoms of Pneumonia, no CHF, will DC Lasix   Lovenox  1 mg/Kg starting today, will DC on OAC once able.  Tele bed  O2 support, wean as tolerated  Duoneb    Thrombocytopenia  No bleeding  Will monitor CBC in am    Questionable CHF  Echo shows normal LVEF, grade 1 diastolic dysfucntion  I assume her symptoms are due to PE and NOT CHF. 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 Orelia fron Radiology    Disposition: TBD    Hospital Problems  Date Reviewed: June 14, 2017          Codes Class Noted POA    Acute hypoxemic respiratory failure (HCC) ICD-10-CM: J96.01  ICD-9-CM: 518.81  06/08/2017 Yes      * (Principal) Acute pulmonary embolism (HCC) ICD-10-CM: I26.99  ICD-9-CM: 415.19  06/08/2017 Yes      DM2 (diabetes mellitus, type 2) (HCC) (Chronic) ICD-10-CM: E11.9  ICD-9-CM: 250.00  06/08/2017 Yes      Hyperthyroidism (Chronic) ICD-10-CM: E05.90  ICD-9-CM: 242.90  06/08/2017 Yes      Essential hypertension ICD-10-CM: I10  ICD-9-CM: 401.9  08/28/2011 Yes      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, adult (HCC) (Chronic) ICD-10-CM: E66.01, Z68.44  ICD-9-CM: 278.01, V85.44  08/28/2011 Yes         Review of Systems:   A comprehensive review of systems was negative.     Physical Examination:  General appearance: alert, no distress, appears stated age  Head: Normocephalic, without obvious abnormality, atraumatic  Lungs: clear to auscultation bilaterally  Heart: regular rate and rhythm  Abdomen: soft, NT, NG, NR  Extremities: no edema  Skin: No rash  Neurologic: Grossly normal      Vital Signs:    Last 24hrs VS reviewed since prior progress note. Most recent are:    Visit Vitals  BP 116/56 (BP 1 Location: Left arm, BP Patient Position: At rest)   Pulse 75   Temp 98.2 F (36.8 C)   Resp 19   Ht 5' 4 (1.626 m)   Wt 136.4 kg (300 lb 11.3 oz)   SpO2 96%   BMI 51.62 kg/m     Intake/Output Summary (Last 24 hours) at 06/09/2017 9177  Last data filed at 06/09/2017 9192  Gross per 24 hour   Intake 240 ml   Output 1900 ml   Net -1660 ml       Tmax:  Temp (24hrs), Avg:97.9 F (36.6 C), Min:97.6 F (36.4 C), Max:98.2 F (36.8 C)    Data Review:     Xr Chest Pa Lat    Result Date: 06/07/2017  CLINICAL HISTORY: Hypoxia INDICATION: Hypoxia COMPARISON: 10/27/2014 FINDINGS: PA and lateral views of the chest are obtained. The cardiopericardial  silhouette is prominent. Diminished lung volumes. Pulmonary artery enlargement is suggested.. There is no pleural effusion, pneumothorax or focal consolidation present.     IMPRESSION: Mild cardiomegaly. Diminished lung volumes with pulmonary artery enlargement suggested.    No results found for: SDES  Lab Results   Component Value Date/Time    Culture result: MIXED SKIN FLORA ISOLATED 12/04/2012 04:45 PM     All Micro Results     Procedure Component Value Units Date/Time    URINE CULTURE HOLD SAMPLE [[id number]]     Order Status:  Sent Specimen:  Urine        Labs:     Recent Labs     06/09/17  0346 06/08/17  0400   WBC 6.8 6.6   HGB 12.9 12.7   HCT 41.9 41.0   PLT 130* 128*     Recent Labs     06/09/17  0346 06/08/17  0400 06/07/17  1954   NA 135* 139 142   K 3.8 3.7 4.0   CL 103 107 109*   CO2 27 24 26    BUN 26* 22* 24*   CREA 0.95 0.92 0.97   GLU 178* 219* 161*   CA 8.7 9.1 9.4   MG  --  1.9  --    PHOS  --  3.8  --      Recent Labs     06/08/17  0400   SGOT 14*   ALT 13   AP 63   TBILI 0.6   TP 7.1   ALB 3.0*   GLOB 4.1*     No results for input(s): INR, PTP, APTT in the last 72 hours.    No lab exists for component: INREXT, INREXT   No results for input(s): FE, TIBC, PSAT, FERR in the last 72 hours.   No results found for: FOL, RBCF   No results for input(s): PH, PCO2, PO2 in the last 72 hours.  Recent Labs     06/08/17  0400 06/07/17  1954   CPK 115  --    CKNDX 1.8  --    TROIQ <0.05 <0.05     Lab Results   Component  Value Date/Time    Cholesterol, total 116 06/08/2017 04:00 AM    HDL Cholesterol 50 06/08/2017 04:00 AM    LDL, calculated 46.2 06/08/2017 04:00 AM    Triglyceride 99 06/08/2017 04:00 AM    CHOL/HDL Ratio 2.3 06/08/2017 04:00 AM     Lab Results   Component Value Date/Time    Glucose (POC) 182 (H) 06/09/2017 06:34 AM    Glucose (POC) 219 (H) 06/08/2017 09:13 PM    Glucose (POC) 169 (H) 06/08/2017 04:54 PM    Glucose (POC) 179 (H) 06/08/2017 11:46 AM    Glucose (POC) 181 (H) 06/08/2017 06:23 AM      Lab Results   Component Value Date/Time    Color YELLOW/STRAW 12/04/2012 04:45 PM    Appearance CLOUDY (A) 12/04/2012 04:45 PM    Specific gravity 1.020 12/04/2012 04:45 PM    pH (UA) 5.5 12/04/2012 04:45 PM    Protein NEGATIVE  12/04/2012 04:45 PM    Glucose NEGATIVE  12/04/2012 04:45 PM    Ketone NEGATIVE  12/04/2012 04:45 PM    Bilirubin NEGATIVE  12/04/2012 04:45 PM    Urobilinogen 0.2 12/04/2012 04:45 PM    Nitrites NEGATIVE  12/04/2012 04:45 PM    Leukocyte Esterase SMALL (A) 12/04/2012 04:45 PM    Epithelial cells MODERATE (A) 12/04/2012 04:45 PM    Bacteria 1+ (A) 12/04/2012 04:45 PM    WBC 5-10 12/04/2012 04:45 PM    RBC 0-5 12/04/2012 04:45 PM     Medications Reviewed:     Current Facility-Administered Medications   Medication Dose Route Frequency   ? aspirin  delayed-release tablet 81 mg  81 mg Oral DAILY   ? lisinopril (PRINIVIL, ZESTRIL) tablet 10 mg  10 mg Oral DAILY   ? methIMAzole  (TAPAZOLE ) tablet 5 mg  5 mg Oral DAILY   ? metoprolol  tartrate (LOPRESSOR ) tablet 50 mg  50 mg Oral BID   ? nystatin (MYCOSTATIN) 100,000 unit/gram powder   Topical QID   ? oxyCODONE-acetaminophen  (PERCOCET 10) 10-325 mg per tablet 1 Tab  1 Tab Oral Q6H PRN   ? rosuvastatin  (CRESTOR ) tablet 20 mg  20 mg Oral QHS   ? triamterene -hydroCHLOROthiazide  (MAXZIDE ) 37.5-25 mg per tablet 1 Tab  1 Tab Oral DAILY   ? sodium chloride  (NS) flush 5-40 mL  5-40 mL IntraVENous Q8H   ? sodium chloride  (NS) flush 5-40 mL  5-40 mL IntraVENous PRN   ? ondansetron  (ZOFRAN ) injection 4 mg  4 mg IntraVENous Q4H PRN   ? bisacodyl (DULCOLAX) tablet 5 mg  5 mg Oral DAILY PRN   ? insulin lispro (HUMALOG) injection   SubCUTAneous AC&HS   ? glucose chewable tablet 16 g  4 Tab Oral PRN   ? dextrose (D50W) injection syrg 12.5-25 g  12.5-25 g IntraVENous PRN   ? glucagon (GLUCAGEN) injection 1 mg  1 mg IntraMUSCular PRN   ? arformoterol  (BROVANA ) neb solution 15 mcg  15 mcg Nebulization BID RT    And   ? budesonide  (PULMICORT ) 500 mcg/2 ml nebulizer  suspension  500 mcg Nebulization BID RT   ? enoxaparin  (LOVENOX ) injection 140 mg  140 mg SubCUTAneous Q12H   ? acetaminophen  (TYLENOL ) tablet 650 mg  650 mg Oral Q4H PRN   ? albuterol-ipratropium (DUO-NEB) 2.5 MG-0.5 MG/3 ML  3 mL Nebulization Q4H PRN     ______________________________________________________________________  EXPECTED LENGTH OF STAY: 2d 9h  ACTUAL LENGTH OF STAY:          2  Zeeshan Khakwani, MD       Electronically signed by Khakwani, Zeeshan, MD at 06/09/2017  8:27 AM EDT

## 2017-06-09 NOTE — Progress Notes (Addendum)
Problem: Falls - Risk of  Goal: *Absence of Falls  Description  Document Allison Newman Fall Risk and appropriate interventions in the flowsheet.  Outcome: Progressing Towards Goal   Patient calls for assistance when needed. Hourly rounds performed.     Bedside shift change report given to Huntley DecSara RN (oncoming nurse) by Gelene MinkWhitley RN (offgoing nurse). Report included the following information SBAR, Kardex, Intake/Output, MAR, Accordion and Recent Results.

## 2017-06-10 LAB — CBC WITH AUTOMATED DIFF
ABS. BASOPHILS: 0 10*3/uL (ref 0.0–0.1)
ABS. EOSINOPHILS: 0.3 10*3/uL (ref 0.0–0.4)
ABS. IMM. GRANS.: 0 10*3/uL (ref 0.00–0.04)
ABS. LYMPHOCYTES: 1.9 10*3/uL (ref 0.8–3.5)
ABS. MONOCYTES: 0.6 10*3/uL (ref 0.0–1.0)
ABS. NEUTROPHILS: 3.4 10*3/uL (ref 1.8–8.0)
ABSOLUTE NRBC: 0 10*3/uL (ref 0.00–0.01)
BASOPHILS: 1 % (ref 0–1)
EOSINOPHILS: 4 % (ref 0–7)
HCT: 39.6 % (ref 35.0–47.0)
HGB: 12.7 g/dL (ref 11.5–16.0)
IMMATURE GRANULOCYTES: 0 % (ref 0.0–0.5)
LYMPHOCYTES: 30 % (ref 12–49)
MCH: 29.4 PG (ref 26.0–34.0)
MCHC: 32.1 g/dL (ref 30.0–36.5)
MCV: 91.7 FL (ref 80.0–99.0)
MONOCYTES: 10 % (ref 5–13)
MPV: 12.6 FL (ref 8.9–12.9)
NEUTROPHILS: 55 % (ref 32–75)
NRBC: 0 PER 100 WBC
PLATELET: 132 10*3/uL — ABNORMAL LOW (ref 150–400)
RBC: 4.32 M/uL (ref 3.80–5.20)
RDW: 13.6 % (ref 11.5–14.5)
WBC: 6.2 10*3/uL (ref 3.6–11.0)

## 2017-06-10 LAB — METABOLIC PANEL, BASIC
Anion gap: 6 mmol/L (ref 5–15)
BUN/Creatinine ratio: 28 — ABNORMAL HIGH (ref 12–20)
BUN: 23 MG/DL — ABNORMAL HIGH (ref 6–20)
CO2: 26 mmol/L (ref 21–32)
Calcium: 8.7 MG/DL (ref 8.5–10.1)
Chloride: 103 mmol/L (ref 97–108)
Creatinine: 0.81 MG/DL (ref 0.55–1.02)
GFR est AA: >60 mL/min/{1.73_m2} (ref >60)
GFR est non-AA: >60 mL/min/{1.73_m2} (ref >60)
Glucose: 188 mg/dL — ABNORMAL HIGH (ref 65–100)
Potassium: 3.7 mmol/L (ref 3.5–5.1)
Sodium: 135 mmol/L — ABNORMAL LOW (ref 136–145)

## 2017-06-10 LAB — GLUCOSE, POC
Glucose (POC): 190 mg/dL — ABNORMAL HIGH (ref 65–100)
Glucose (POC): 174 mg/dL — ABNORMAL HIGH (ref 65–100)
Glucose (POC): 168 mg/dL — ABNORMAL HIGH (ref 65–100)
Glucose (POC): 164 mg/dL — ABNORMAL HIGH (ref 65–100)

## 2017-06-10 MED ORDER — APIXABAN 5 MG TABLET
5 mg | Freq: Two times a day (BID) | ORAL | Status: DC
Start: 2017-06-10 — End: 2017-06-11
  Administered 2017-06-10 – 2017-06-11 (×3): via ORAL

## 2017-06-10 MED ORDER — APIXABAN 5 MG TABLET
5 mg | Freq: Two times a day (BID) | ORAL | Status: DC
Start: 2017-06-10 — End: 2017-06-11

## 2017-06-10 MED ORDER — SIMETHICONE 80 MG CHEWABLE TAB
80 mg | Freq: Four times a day (QID) | ORAL | Status: DC | PRN
Start: 2017-06-10 — End: 2017-06-11
  Administered 2017-06-10 – 2017-06-11 (×3): via ORAL

## 2017-06-10 MED FILL — BROVANA 15 MCG/2 ML SOLUTION FOR NEBULIZATION: 15 mcg/2 mL | RESPIRATORY_TRACT | Qty: 1

## 2017-06-10 MED FILL — NORMAL SALINE FLUSH 0.9 % INJECTION SYRINGE: INTRAMUSCULAR | Qty: 10

## 2017-06-10 MED FILL — SENOKOT-S 8.6 MG-50 MG TABLET: ORAL | Qty: 1

## 2017-06-10 MED FILL — GAS RELIEF (SIMETHICONE) 80 MG CHEWABLE TABLET: 80 mg | ORAL | Qty: 1

## 2017-06-10 MED FILL — LISINOPRIL 10 MG TAB: 10 mg | ORAL | Qty: 1

## 2017-06-10 MED FILL — INSULIN LISPRO 100 UNIT/ML INJECTION: 100 unit/mL | SUBCUTANEOUS | Qty: 1

## 2017-06-10 MED FILL — BUDESONIDE 0.5 MG/2 ML NEB SUSPENSION: 0.5 mg/2 mL | RESPIRATORY_TRACT | Qty: 1

## 2017-06-10 MED FILL — METOPROLOL TARTRATE 25 MG TAB: 25 mg | ORAL | Qty: 2

## 2017-06-10 MED FILL — ASPIRIN 81 MG TAB, DELAYED RELEASE: 81 mg | ORAL | Qty: 1

## 2017-06-10 MED FILL — TRIAMTERENE-HYDROCHLOROTHIAZIDE 37.5 MG-25 MG TAB: ORAL | Qty: 1

## 2017-06-10 MED FILL — OXYCODONE-ACETAMINOPHEN 10 MG-325 MG TAB: 10-325 mg | ORAL | Qty: 1

## 2017-06-10 MED FILL — ELIQUIS 5 MG TABLET: 5 mg | ORAL | Qty: 2

## 2017-06-10 MED FILL — ROSUVASTATIN 10 MG TAB: 10 mg | ORAL | Qty: 2

## 2017-06-10 MED FILL — LOVENOX 150 MG/ML SUBCUTANEOUS SYRINGE: 150 mg/mL | SUBCUTANEOUS | Qty: 1

## 2017-06-10 MED FILL — METHIMAZOLE 5 MG TAB: 5 mg | ORAL | Qty: 1

## 2017-06-10 NOTE — Progress Notes (Signed)
Problem: Mobility Impaired (Adult and Pediatric)  Goal: *Acute Goals and Plan of Care (Insert Text)  Description  Physical Therapy Goals  Initiated 06/10/2017  1.  Patient will move from supine to sit and sit to supine  and roll side to side in bed with modified independence within 7 day(s).    2.  Patient will transfer from bed to chair and chair to bed with modified independence using the least restrictive device within 7 day(s).  3.  Patient will perform sit to stand with modified independence within 7 day(s).  4.  Patient will ambulate with modified independence for 300 feet with the least restrictive device within 7 day(s).   5.  Patient will ascend/descend 6 stairs with 1 handrail(s) with modified independence within 7 day(s).   Outcome: Progressing Towards Goal   PHYSICAL THERAPY EVALUATION  Patient: Allison Newman (77 y.o. female)  Date: 06/10/2017  Primary Diagnosis: Acute CHF (congestive heart failure) (HCC) [I50.9]        Precautions:   Fall    ASSESSMENT :   Based on the objective data described below, patient presents with Stand-by assistance overall for functional mobility after being admitted with bilateral PEs. Gait training completed at Stand-by assistance, 150 feet and using a rolling walker.   She was able to ambulate on room air without DOE and all VSS.  She is limited primarily by her longstanding joint and back pain.  Educated about the importance of continued activity at home.  She reports that she has a multi level home and only 1 walker, so when she goes upstairs, she does not like to walk around much because her walker remains downstairs.  Highly recommend a second walker, since she does  not have a family member readily available to move the walker for her.  We will submit to insurance for authorization since she believes she got her current walker only a few years ago.    Recommend HHPT follow up to assist with establishing a home exercise  program to encourage continued activity to promote weight loss, strength and balance.  The following are barriers to independence while in acute care:   -Cognitive and/or behavioral: none   -Medical condition: strength and mult joint arthritis, obesity     -Other:       The patient will benefit from skilled acute intervention to address the above impairments and their rehabilitation potential is considered to be Good    Discharge recommendations: Home health (to increase independence and safety)  If above is not an option then recommend: None    Patient's barriers to discharging home, in addition to above impairments: none.     Equipment recommendations for successful discharge (if) home: rolling walker       PLAN :  Recommendations and Planned Interventions: gait training, therapeutic exercises, patient and family training/education and therapeutic activities      Frequency/Duration: Patient will be followed by physical therapy  3 times a week to address goals.     SUBJECTIVE:   Patient stated ?I do what I can at home.?    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    Diabetes (HCC)     Elevated BUN 09/13/2011    Finger lesion 03/02/2016    Gallbladder polyp 04/08/2013    Gastrointestinal disorder     GERD    GERD (gastroesophageal reflux disease)     Hypercholesterolemia     Hypertension     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    Microalbuminuria 08/29/2013    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, adult (HCC) 08/28/2011    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    On aspirin at home 05/03/2015     Other unknown and unspecified cause of morbidity or mortality     hx of bronchitis    Rash, skin 09/25/2012    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) 03/02/2016    Slow transit constipation 08/13/2014    Umbilical hernia 05/08/2013     Past Surgical History:   Procedure Laterality Date    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    HX ORTHOPAEDIC  2000    screw in foot left    HX OTHER SURGICAL      colonoscopy    HX TUBAL LIGATION       Prior Level of Function/Home Situation: ambulates with RW, lives with a grandson who is 518 y/o, multi level home  Personal factors and/or comorbidities impacting plan of care: obesity, arthritis, bilateral PEs    Home Situation  Home Environment: Private residence  One/Two Story Residence: Other (Comment)  Living Alone: No  Support Systems: Warehouse managerChurch / faith community, Family member(s), Child(ren)  Patient Expects to be Discharged to:: Private residence  Current DME Used/Available at Home: Gilmer Morane, straight, Environmental consultantWalker, Paediatric nursehower chair, Glucometer  EXAMINATION/PRESENTATION/DECISION MAKING:   Critical Behavior:  Neurologic State: Alert, Appropriate for age  Orientation Level: Oriented X4  Cognition: Follows commands, Appropriate decision making, Appropriate for age attention/concentration, Appropriate safety awareness     Hearing:  Auditory  Auditory Impairment: None  Skin:  intact  Edema: none  Range Of Motion:  AROM: Generally decreased, functional(no acute changes, long term multijoint arthritis)                       Strength:    Strength: Generally decreased, functional                    Tone & Sensation:   Tone: Normal              Sensation: Intact               Coordination:  Coordination: Within functional limits  Vision:      Functional Mobility:  Bed Mobility:     Supine to Sit: (up in chair on arrival)  Sit to Supine: (up in chair after)     Transfers:  Sit to Stand: Stand-by assistance;Adaptive equipment;Additional time(needs to use momentum )   Stand to Sit: Stand-by assistance;Adaptive equipment;Additional time        Bed to Chair: Stand-by assistance;Adaptive equipment;Additional time              Balance:   Sitting: Intact;Without support  Standing: Intact;With support  Ambulation/Gait Training:  Distance (ft): 150 Feet (ft)  Assistive Device: Gait belt;Walker, rolling  Ambulation - Level of Assistance: Supervision;Additional time;Adaptive equipment        Gait Abnormalities: Decreased step  clearance;Trunk sway increased        Base of Support: Widened     Speed/Cadence: Pace decreased (<100 feet/min)  Step Length: Left shortened;Right shortened        Interventions: Safety awareness training;Tactile cues;Verbal cues;Visual/Demos            Stairs:              Therapeutic Exercises:       Functional Measure:  Barthel Index:  Bathing: 0  Bladder: 10  Bowels: 10  Grooming: 5  Dressing: 5  Feeding: 10  Mobility: 10  Stairs: 0  Toilet Use: 10  Transfer (Bed to Chair and Back): 10  Total: 70/100      Percentage of impairment   0%   1-19%   20-39%   40-59%   60-79%   80-99%   100%   Barthel Score 0-100 100 99-80 79-60 59-40 20-39 1-19   0     The Barthel ADL Index: Guidelines  1. The index should be used as a record of what a patient does, not as a record of what a patient could do.  2. The main aim is to establish degree of independence from any help, physical or verbal, however minor and for whatever reason.  3. The need for supervision renders the patient not independent.  4. A patient's performance should be established using the best available evidence. Asking the patient, friends/relatives and nurses are the usual sources, but direct observation and common sense are also important. However direct testing is not needed.  5. Usually the patient's performance over the preceding 24-48 hours is important, but occasionally longer periods will be relevant.  6. Middle categories imply that the patient supplies over 50 per cent of the effort.   7. Use of aids to be independent is allowed.    Clarisa Kindred., Barthel, D.W. [phone]). Functional evaluation: the Barthel Index. Md State Med J (14)2.  Zenaida Niece der [place]en, J.J.M.F, [place], Ian Malkin., Margret Chance., [place], [place]. (1999). Measuring the change indisability after inpatient rehabilitation; comparison of the responsiveness of the Barthel Index and Functional Independence Measure. Journal of Neurology, Neurosurgery, and Psychiatry, 66(4), [id number].  Dawson Bills, N.J.A, [place],  W.J.M, & Koopmanschap, M.A. (2004.) Assessment of post-stroke quality of life in cost-effectiveness studies: The usefulness of the Barthel Index and the EuroQoL-5D. Quality of Life Research, 32, 098-11           Physical Therapy Evaluation Charge Determination   History Examination Presentation Decision-Making   MEDIUM  Complexity : 1-2 comorbidities / personal factors will impact the outcome/ POC  MEDIUM Complexity : 3 Standardized tests and measures addressing body structure, function, activity limitation and / or participation in recreation  LOW Complexity : Stable, uncomplicated  LOW Complexity : FOTO score of 75-100      Based on the above components, the patient evaluation is determined to be of the following complexity level: LOW     Pain:  Pre treatment: 5 /10   During treatment: 5/10  Post treatment:  5/10   Location: back, shoulders, knees, L foot    Description:aching   Aggravating factors:     Activity Tolerance:   Good  Please refer to the flowsheet for vital signs taken during this treatment.    After treatment patient left:   Up in chair  Call light within reach  RN notified     COMMUNICATION/EDUCATION:   The patient?s plan of care was discussed with: Registered Nurse.  Fall prevention education was provided and the patient/caregiver indicated understanding., Patient/family have participated as able in goal setting and plan of care. and Patient/family agree to work toward stated goals and plan of care.     Thank you for this referral.  Hetty Ely, PT   Time Calculation: 28 mins

## 2017-06-10 NOTE — Progress Notes (Signed)
Spiritual Care Partner Volunteer visited patient in Rm 419 on 06/10/17.  Documented by:  [address] StreetWhitney Caswell, Chaplain, MDiv, MACE  [address] [phone]

## 2017-06-10 NOTE — Progress Notes (Addendum)
Problem: Pulmonary Embolism Care Plan (Adult)  Goal: *Improvement of existing pulmonary embolism  Outcome: Progressing Towards Goal   Pt educated on Eliquis use and side effects, pt given handout on medication, pt verbalized understanding. Pt tolerating room air during the day with activity with O2 remaining above 90%. Pt requiring O2 at night when sleeping. Awaiting PT/OT consult for potential discharge on Monday and potential need for home PT/OT. CM consulted to assist with discharge. Will continue to monitor and educate.     Bedside shift change report given to Jodelle GreenWhitley RN (oncoming nurse) by Lubertha BasqueSara RN (offgoing nurse). Report included the following information SBAR, Kardex, ED Summary, Procedure Summary, Intake/Output, MAR, Recent Results, Med Rec Status, Cardiac Rhythm NSR, Alarm Parameters  and Quality Measures.

## 2017-06-10 NOTE — Progress Notes (Signed)
 Formatting of this note is different from the original.    Problem: Mobility Impaired (Adult and Pediatric)  Goal: *Acute Goals and Plan of Care (Insert Text)  Description  Physical Therapy Goals  Initiated 06/10/2017  1.  Patient will move from supine to sit and sit to supine  and roll side to side in bed with modified independence within 7 day(s).    2.  Patient will transfer from bed to chair and chair to bed with modified independence using the least restrictive device within 7 day(s).  3.  Patient will perform sit to stand with modified independence within 7 day(s).  4.  Patient will ambulate with modified independence for 300 feet with the least restrictive device within 7 day(s).   5.  Patient will ascend/descend 6 stairs with 1 handrail(s) with modified independence within 7 day(s).   Outcome: Progressing Towards Goal   PHYSICAL THERAPY EVALUATION  Patient: Allison Newman (77 y.o. female)  Date: 06/10/2017  Primary Diagnosis: Acute CHF (congestive heart failure) (HCC) [I50.9]    Precautions:   Fall    ASSESSMENT :   Based on the objective data described below, patient presents with Stand-by assistance overall for functional mobility after being admitted with bilateral PEs. Gait training completed at Stand-by assistance, 150 feet and using a rolling walker.   She was able to ambulate on room air without DOE and all VSS.  She is limited primarily by her longstanding joint and back pain.  Educated about the importance of continued activity at home.  She reports that she has a multi level home and only 1 walker, so when she goes upstairs, she does not like to walk around much because her walker remains downstairs.  Highly recommend a second walker, since she does  not have a family member readily available to move the walker for her.  We will submit to insurance for authorization since she believes she got her current walker only a few years ago.    Recommend HHPT follow up to assist with establishing a home  exercise program to encourage continued activity to promote weight loss, strength and balance.  The following are barriers to independence while in acute care:   -Cognitive and/or behavioral: none   -Medical condition: strength and mult joint arthritis, obesity     -Other:       The patient will benefit from skilled acute intervention to address the above impairments and their rehabilitation potential is considered to be Good    Discharge recommendations: Home health (to increase independence and safety)  If above is not an option then recommend: None    Patient's barriers to discharging home, in addition to above impairments: none.    Equipment recommendations for successful discharge (if) home: rolling walker     PLAN :  Recommendations and Planned Interventions: gait training, therapeutic exercises, patient and family training/education and therapeutic activities      Frequency/Duration: Patient will be followed by physical therapy  3 times a week to address goals.     SUBJECTIVE:   Patient stated ?I do what I can at home.?    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    Diabetes (HCC)     Elevated BUN 09/13/2011    Finger lesion 03/02/2016    Gallbladder polyp 04/08/2013    Gastrointestinal disorder     GERD    GERD (gastroesophageal reflux disease)     Hypercholesterolemia     Hypertension     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    Microalbuminuria 08/29/2013    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, adult (HCC) 08/28/2011    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    On aspirin  at home  05/03/2015    Other unknown and unspecified cause of morbidity or mortality     hx of bronchitis    Rash, skin 09/25/2012    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) 03/02/2016    Slow transit constipation 08/13/2014    Umbilical hernia 05/08/2013     Past Surgical History:   Procedure Laterality Date    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    HX ORTHOPAEDIC  2000    screw in foot left    HX OTHER SURGICAL      colonoscopy    HX TUBAL LIGATION       Prior Level of Function/Home Situation: ambulates with RW, lives with a grandson who is 35 y/o, multi level home  Personal factors and/or comorbidities impacting plan of care: obesity, arthritis, bilateral PEs    Home Situation  Home Environment: Private residence  One/Two Story Residence: Other (Comment)  Living Alone: No  Support Systems: Warehouse manager / faith community, Family member(s), Child(ren)  Patient Expects to be Discharged to:: Private residence  Current DME Used/Available at Home: [place], straight, Environmental consultant, Paediatric nurse, Glucometer    EXAMINATION/PRESENTATION/DECISION MAKING:   Critical Behavior:  Neurologic State: Alert, Appropriate for age  Orientation Level: Oriented X4  Cognition: Follows commands, Appropriate decision making, Appropriate for age attention/concentration, Appropriate safety awareness    Hearing:  Auditory  Auditory Impairment: None  Skin:  intact  Edema: none  Range Of Motion:  AROM: Generally decreased, functional(no acute changes, long term multijoint arthritis)                Strength:    Strength: Generally decreased, functional              Tone & Sensation:   Tone: Normal          Sensation: Intact          Coordination:  Coordination: Within functional limits  Vision:     Functional Mobility:  Bed Mobility:    Supine to Sit: (up in chair on arrival)  Sit to Supine: (up in chair after)    Transfers:  Sit to Stand: Stand-by assistance;Adaptive equipment;Additional time(needs to use momentum )  Stand to Sit: Stand-by assistance;Adaptive  equipment;Additional time    Bed to Chair: Stand-by assistance;Adaptive equipment;Additional time        Balance:   Sitting: Intact;Without support  Standing: Intact;With support  Ambulation/Gait Training:  Distance (ft): 150 Feet (ft)  Assistive Device: Gait belt;Walker, rolling  Ambulation - Level of Assistance: Supervision;Additional time;Adaptive equipment      Gait Abnormalities: Decreased step clearance;Trunk sway increased      Base of Support: Widened    Speed/Cadence: Pace decreased (<100 feet/min)  Step Length: Left shortened;Right shortened  Interventions: Safety awareness training;Tactile cues;Verbal cues;Visual/Demos         Stairs:          Therapeutic Exercises:     Functional Measure:  Barthel Index:    Bathing: 0  Bladder: 10  Bowels: 10  Grooming: 5  Dressing: 5  Feeding: 10  Mobility: 10  Stairs: 0  Toilet Use: 10  Transfer (Bed to Chair and Back): 10  Total: 70/100      Percentage of impairment   0%   1-19%   20-39%   40-59%   60-79%   80-99%   100%   Barthel Score 0-100 100 99-80 79-60 59-40 20-39 1-19   0     The Barthel ADL Index: Guidelines  1. The index should be used as a record of what a patient does, not as a record of what a patient could do.  2. The main aim is to establish degree of independence from any help, physical or verbal, however minor and for whatever reason.  3. The need for supervision renders the patient not independent.  4. A patient's performance should be established using the best available evidence. Asking the patient, friends/relatives and nurses are the usual sources, but direct observation and common sense are also important. However direct testing is not needed.  5. Usually the patient's performance over the preceding 24-48 hours is important, but occasionally longer periods will be relevant.  6. Middle categories imply that the patient supplies over 50 per cent of the effort.  7. Use of aids to be independent is allowed.    Henriette Desanctis., Barthel, D.W. [phone]).  Functional evaluation: the Barthel Index. Md State Med J (14)2.  Fleeta der [place], J.J.M.F, [place], DAIVD., [place]., [place], [place]. (1999). Measuring the change indisability after inpatient rehabilitation; comparison of the responsiveness of the Barthel Index and Functional Independence Measure. Journal of Neurology, Neurosurgery, and Psychiatry, 66(4), [phone].  Fleeta Chillington, N.J.A, [place],  W.J.M, & Koopmanschap, M.A. (2004.) Assessment of post-stroke quality of life in cost-effectiveness studies: The usefulness of the Barthel Index and the EuroQoL-5D. Quality of Life Research, 68, 572-56         Physical Therapy Evaluation Charge Determination   History Examination Presentation Decision-Making   MEDIUM  Complexity : 1-2 comorbidities / personal factors will impact the outcome/ POC  MEDIUM Complexity : 3 Standardized tests and measures addressing body structure, function, activity limitation and / or participation in recreation  LOW Complexity : Stable, uncomplicated  LOW Complexity : FOTO score of 75-100     Based on the above components, the patient evaluation is determined to be of the following complexity level: LOW     Pain:  Pre treatment: 5 /10   During treatment: 5/10  Post treatment:  5/10   Location: back, shoulders, knees, L foot    Description:aching   Aggravating factors:     Activity Tolerance:   Good  Please refer to the flowsheet for vital signs taken during this treatment.    After treatment patient left:   Up in chair  Call light within reach  RN notified     COMMUNICATION/EDUCATION:   The patient?s plan of care was discussed with: Registered Nurse.    Fall prevention education was provided and the patient/caregiver indicated understanding., Patient/family have participated as able in goal setting and plan of care. and Patient/family agree to work toward stated goals and plan of care.    Thank you for this referral.  Tiffany  E Heller, PT   Time Calculation: 28 mins      Electronically  signed by Ardis Annabella BRAVO, PT at 06/10/2017 12:36 PM EDT

## 2017-06-10 NOTE — Progress Notes (Signed)
 Formatting of this note is different from the original.  Images from the original note were not included.          Hospitalist Progress Note  Torrie Rao, MD  Answering service: [phone] OR [phone] from in house phone      Date of Service:  06/10/2017  NAME:  Allison Newman  DOB:  1941-01-01  MRN:  [id number]  PCP: Viktoria Arty SAILOR, MD    Chief Complaint:   Chief Complaint   Patient presents with   ? Shortness of Breath     Admission Summary:     Allison Newman is a 77 y.o. female who presented with SOB    Interval history / Subjective:   Patient seen for Follow up of PE    Patient seen and examined by the bedside  Labs, images and notes reviewed  Pt is comfortable at rest, hypoxemia is improving but desaturates during sleep, which is likely chronic and may need sleep studies outpatient.  Afebrile  No cough  No NVD  Unsteady on ambulation and needs PT/OT  Discussed with nursing staff, no acute issues overnight, orders reviewed.     Assessment & Plan:     Acute hypoxemic respiratory failure sec to Bilateral PE, POA  O2 sat 88% on RA  DW Dr. Orelia from radiology, patient has bilateral PE on CTA chest  NIV neg for DVT but limited studies technically.  No obvious signs/symptoms of Pneumonia, no CHF-lasix  stopped  DC lovenox , started on Eliquis 10 mg PO BID till 06/16/17, and then 5mg  po BID from 06/17/17- patient advised to get age appropriate cancer screening  Tele bed  O2 support, wean as tolerated  Duoneb  PT/OT    Thrombocytopenia  No bleeding  stable  Will monitor CBC in am    Questionable CHF  Echo shows normal LVEF, grade 1 diastolic dysfucntion  I assume her symptoms are due to PE and NOT CHF. 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  Body mass index is 52.41 kg/m.p  Weight loss  recommended    Goiter  Unchanged compared to CT done on 06/25/12    Lung Nodule  Right apical  Unchanged compared to CT since 2014  Needs follow up    Code status: Full Code    DVT prophylaxis: Eliquis    Care Plan discussed with: Patient/Family, Nurse    Disposition: TBD    Hospital Problems  Date Reviewed: 2017-06-17          Codes Class Noted POA    Acute hypoxemic respiratory failure (HCC) ICD-10-CM: J96.01  ICD-9-CM: 518.81  06/08/2017 Yes      * (Principal) Acute pulmonary embolism (HCC) ICD-10-CM: I26.99  ICD-9-CM: 415.19  06/08/2017 Yes      DM2 (diabetes mellitus, type 2) (HCC) (Chronic) ICD-10-CM: E11.9  ICD-9-CM: 250.00  06/08/2017 Yes      Hyperthyroidism (Chronic) ICD-10-CM: E05.90  ICD-9-CM: 242.90  06/08/2017 Yes      Essential hypertension ICD-10-CM: I10  ICD-9-CM: 401.9  08/28/2011 Yes      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, adult (HCC) (Chronic) ICD-10-CM: E66.01, Z68.44  ICD-9-CM: 278.01, V85.44  08/28/2011 Yes         Review  of Systems:   A comprehensive review of systems was negative.     Physical Examination:     General appearance: alert, no distress, appears stated age  Head: Normocephalic, without obvious abnormality, atraumatic  Lungs: clear to auscultation bilaterally  Heart: regular rate and rhythm  Abdomen: soft, NT, NG, NR  Extremities: no edema  Skin: No rash  Neurologic: Grossly normal      Vital Signs:    Last 24hrs VS reviewed since prior progress note. Most recent are:    Visit Vitals  BP 121/64 (BP 1 Location: Right arm, BP Patient Position: At rest)   Pulse 74   Temp 98.3 F (36.8 C)   Resp 17   Ht 5' 4 (1.626 m)   Wt 138.5 kg (305 lb 5.4 oz)   SpO2 95%   BMI 52.41 kg/m     Intake/Output Summary (Last 24 hours) at 06/10/2017 9082  Last data filed at 06/10/2017 0407  Gross per 24 hour   Intake 840 ml   Output 300 ml   Net 540 ml       Tmax:  Temp (24hrs), Avg:98 F (36.7 C), Min:97.2 F (36.2 C), Max:98.8 F (37.1 C)    Data Review:     Xr Chest Pa Lat    Result Date: 06/07/2017  CLINICAL  HISTORY: Hypoxia INDICATION: Hypoxia COMPARISON: 10/27/2014 FINDINGS: PA and lateral views of the chest are obtained. The cardiopericardial silhouette is prominent. Diminished lung volumes. Pulmonary artery enlargement is suggested.. There is no pleural effusion, pneumothorax or focal consolidation present.     IMPRESSION: Mild cardiomegaly. Diminished lung volumes with pulmonary artery enlargement suggested.    No results found for: SDES  Lab Results   Component Value Date/Time    Culture result: MIXED SKIN FLORA ISOLATED 12/04/2012 04:45 PM     All Micro Results     Procedure Component Value Units Date/Time    URINE CULTURE HOLD SAMPLE [[id number]] Collected:  06/07/17 2100    Order Status:  Canceled Specimen:  Urine        Labs:     Recent Labs     06/10/17  0347 06/09/17  0346   WBC 6.2 6.8   HGB 12.7 12.9   HCT 39.6 41.9   PLT 132* 130*     Recent Labs     06/10/17  0347 06/09/17  0346 06/08/17  0400   NA 135* 135* 139   K 3.7 3.8 3.7   CL 103 103 107   CO2 26 27 24    BUN 23* 26* 22*   CREA 0.81 0.95 0.92   GLU 188* 178* 219*   CA 8.7 8.7 9.1   MG  --   --  1.9   PHOS  --   --  3.8     Recent Labs     06/08/17  0400   SGOT 14*   ALT 13   AP 63   TBILI 0.6   TP 7.1   ALB 3.0*   GLOB 4.1*     No results for input(s): INR, PTP, APTT in the last 72 hours.    No lab exists for component: INREXT, INREXT   No results for input(s): FE, TIBC, PSAT, FERR in the last 72 hours.   No results found for: FOL, RBCF   No results for input(s): PH, PCO2, PO2 in the last 72 hours.  Recent Labs     06/08/17  0400 06/07/17  1954   CPK 115  --  CKNDX 1.8  --    TROIQ <0.05 <0.05     Lab Results   Component Value Date/Time    Cholesterol, total 116 06/08/2017 04:00 AM    HDL Cholesterol 50 06/08/2017 04:00 AM    LDL, calculated 46.2 06/08/2017 04:00 AM    Triglyceride 99 06/08/2017 04:00 AM    CHOL/HDL Ratio 2.3 06/08/2017 04:00 AM     Lab Results   Component Value Date/Time    Glucose (POC) 174 (H) 06/10/2017 06:35 AM    Glucose  (POC) 190 (H) 06/09/2017 09:07 PM    Glucose (POC) 162 (H) 06/09/2017 04:44 PM    Glucose (POC) 189 (H) 06/09/2017 12:18 PM    Glucose (POC) 182 (H) 06/09/2017 06:34 AM     Lab Results   Component Value Date/Time    Color YELLOW/STRAW 12/04/2012 04:45 PM    Appearance CLOUDY (A) 12/04/2012 04:45 PM    Specific gravity 1.020 12/04/2012 04:45 PM    pH (UA) 5.5 12/04/2012 04:45 PM    Protein NEGATIVE  12/04/2012 04:45 PM    Glucose NEGATIVE  12/04/2012 04:45 PM    Ketone NEGATIVE  12/04/2012 04:45 PM    Bilirubin NEGATIVE  12/04/2012 04:45 PM    Urobilinogen 0.2 12/04/2012 04:45 PM    Nitrites NEGATIVE  12/04/2012 04:45 PM    Leukocyte Esterase SMALL (A) 12/04/2012 04:45 PM    Epithelial cells MODERATE (A) 12/04/2012 04:45 PM    Bacteria 1+ (A) 12/04/2012 04:45 PM    WBC 5-10 12/04/2012 04:45 PM    RBC 0-5 12/04/2012 04:45 PM     Medications Reviewed:     Current Facility-Administered Medications   Medication Dose Route Frequency   ? apixaban (ELIQUIS) tablet 10 mg  10 mg Oral Q12H   ? [START ON 06/17/2017] apixaban (ELIQUIS) tablet 5 mg  5 mg Oral Q12H   ? senna-docusate (PERICOLACE) 8.6-50 mg per tablet 1 Tab  1 Tab Oral DAILY   ? aspirin  delayed-release tablet 81 mg  81 mg Oral DAILY   ? lisinopril (PRINIVIL, ZESTRIL) tablet 10 mg  10 mg Oral DAILY   ? methIMAzole  (TAPAZOLE ) tablet 5 mg  5 mg Oral DAILY   ? metoprolol  tartrate (LOPRESSOR ) tablet 50 mg  50 mg Oral BID   ? nystatin (MYCOSTATIN) 100,000 unit/gram powder   Topical QID   ? oxyCODONE-acetaminophen  (PERCOCET 10) 10-325 mg per tablet 1 Tab  1 Tab Oral Q6H PRN   ? rosuvastatin  (CRESTOR ) tablet 20 mg  20 mg Oral QHS   ? triamterene -hydroCHLOROthiazide  (MAXZIDE ) 37.5-25 mg per tablet 1 Tab  1 Tab Oral DAILY   ? sodium chloride  (NS) flush 5-40 mL  5-40 mL IntraVENous Q8H   ? sodium chloride  (NS) flush 5-40 mL  5-40 mL IntraVENous PRN   ? ondansetron  (ZOFRAN ) injection 4 mg  4 mg IntraVENous Q4H PRN   ? bisacodyl (DULCOLAX) tablet 5 mg  5 mg Oral DAILY PRN   ?  insulin lispro (HUMALOG) injection   SubCUTAneous AC&HS   ? glucose chewable tablet 16 g  4 Tab Oral PRN   ? dextrose (D50W) injection syrg 12.5-25 g  12.5-25 g IntraVENous PRN   ? glucagon (GLUCAGEN) injection 1 mg  1 mg IntraMUSCular PRN   ? arformoterol  (BROVANA ) neb solution 15 mcg  15 mcg Nebulization BID RT    And   ? budesonide  (PULMICORT ) 500 mcg/2 ml nebulizer suspension  500 mcg Nebulization BID RT   ? acetaminophen  (TYLENOL ) tablet 650 mg  650 mg Oral Q4H PRN   ? albuterol-ipratropium (DUO-NEB) 2.5 MG-0.5 MG/3 ML  3 mL Nebulization Q4H PRN     ______________________________________________________________________  EXPECTED LENGTH OF STAY: 2d 9h  ACTUAL LENGTH OF STAY:          3      Zeeshan Khakwani, MD       Electronically signed by Khakwani, Zeeshan, MD at 06/10/2017  9:19 AM EDT

## 2017-06-10 NOTE — Progress Notes (Signed)
 Formatting of this note might be different from the original.    Problem: Falls - Risk of  Goal: *Absence of Falls  Description  Document Deloris Fall Risk and appropriate interventions in the flowsheet.  Outcome: Progressing Towards Goal   Patient calls for assistance when needed. Hourly rounds performed.    Bedside shift change report given to Therisa Read  (oncoming nurse) by Wilbern RN (offgoing nurse). Report included the following information SBAR, Kardex, Intake/Output, MAR, Accordion and Recent Results.   Electronically signed by Tanda Wilbern at 06/11/2017  6:58 AM EDT

## 2017-06-10 NOTE — Progress Notes (Signed)
 Formatting of this note might be different from the original.  Spiritual Care Partner Volunteer visited patient in Rm 419 on 06/10/17.  Documented by:  [address], Chaplain, MDiv, MACE  [address] [phone])  Electronically signed by Merideth Folks at 06/10/2017  4:41 PM EDT

## 2017-06-10 NOTE — Progress Notes (Signed)
 Formatting of this note might be different from the original.  Problem: Pulmonary Embolism Care Plan (Adult)  Goal: *Improvement of existing pulmonary embolism  Outcome: Progressing Towards Goal   Pt educated on Eliquis use and side effects, pt given handout on medication, pt verbalized understanding. Pt tolerating room air during the day with activity with O2 remaining above 90%. Pt requiring O2 at night when sleeping. Awaiting PT/OT consult for potential discharge on Monday and potential need for home PT/OT. CM consulted to assist with discharge. Will continue to monitor and educate.     Bedside shift change report given to Wilbern RN (oncoming nurse) by Camie PEAK (offgoing nurse). Report included the following information SBAR, Kardex, ED Summary, Procedure Summary, Intake/Output, MAR, Recent Results, Med Rec Status, Cardiac Rhythm NSR, Alarm Parameters  and Quality Measures.     Electronically signed by Raymond Camie LABOR., RN at 06/10/2017  7:05 PM EDT

## 2017-06-10 NOTE — Progress Notes (Signed)
Hospitalist Progress Note  Rulon Abide, MD  Answering service: [phone] OR [phone] from in house phone        Date of Service:  06/10/2017  NAME:  Allison Newman  DOB:  1940/11/01  MRN:  [id number]  PCP: Barbarann Ehlers, MD    Chief Complaint:   Chief Complaint   Patient presents with   ??? Shortness of Breath         Admission Summary:     Allison Newman is a 77 y.o. female who presented with SOB    Interval history / Subjective:   Patient seen for Follow up of PE    Patient seen and examined by the bedside  Labs, images and notes reviewed  Pt is comfortable at rest, hypoxemia is improving but desaturates during sleep, which is likely chronic and may need sleep studies outpatient.  Afebrile  No cough  No NVD  Unsteady on ambulation and needs PT/OT  Discussed with nursing staff, no acute issues overnight, orders reviewed.     Assessment & Plan:     Acute hypoxemic respiratory failure sec to Bilateral PE, POA  O2 sat 88% on RA  DW Dr. Margarite Gouge from radiology, patient has bilateral PE on CTA chest  NIV neg for DVT but limited studies technically.  No obvious signs/symptoms of Pneumonia, no CHF-lasix stopped  DC lovenox, started on Eliquis 10 mg PO BID till 06/16/17, and then 5mg  po BID from 06/17/17- patient advised to get age appropriate cancer screening  Tele bed  O2 support, wean as tolerated  Duoneb  PT/OT    Thrombocytopenia  No bleeding  stable  Will monitor CBC in am    Questionable CHF  Echo shows normal LVEF, grade 1 diastolic dysfucntion  I assume her symptoms are due to PE and NOT CHF. 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  Body mass index is 52.41 kg/m??.p  Weight loss recommended    Goiter  Unchanged compared to CT done on 06/25/12    Lung Nodule  Right apical   Unchanged compared to CT since 2014  Needs follow up    Code status: Full Code    DVT prophylaxis: Eliquis    Care Plan discussed with: Patient/Family, Nurse    Disposition: TBD    Hospital Problems  Date Reviewed: Jun 13, 2017          Codes Class Noted POA    Acute hypoxemic respiratory failure (HCC) ICD-10-CM: J96.01  ICD-9-CM: 518.81  06/08/2017 Yes        * (Principal) Acute pulmonary embolism (HCC) ICD-10-CM: I26.99  ICD-9-CM: 415.19  06/08/2017 Yes        DM2 (diabetes mellitus, type 2) (HCC) (Chronic) ICD-10-CM: E11.9  ICD-9-CM: 250.00  06/08/2017 Yes        Hyperthyroidism (Chronic) ICD-10-CM: E05.90  ICD-9-CM: 242.90  06/08/2017 Yes        Essential hypertension ICD-10-CM: I10  ICD-9-CM: 401.9  08/28/2011 Yes        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, adult (HCC) (Chronic) ICD-10-CM: E66.01, Z68.44  ICD-9-CM: 278.01, V85.44  08/28/2011 Yes                Review of Systems:  A comprehensive review of systems was negative.         Physical Examination:     General appearance: alert, no distress, appears stated age  Head: Normocephalic, without obvious abnormality, atraumatic  Lungs: clear to auscultation bilaterally  Heart: regular rate and rhythm  Abdomen: soft, NT, NG, NR  Extremities: no edema  Skin: No rash  Neurologic: Grossly normal       Vital Signs:    Last 24hrs VS reviewed since prior progress note. Most recent are:    Visit Vitals  BP 121/64 (BP 1 Location: Right arm, BP Patient Position: At rest)   Pulse 74   Temp 98.3 ??F (36.8 ??C)   Resp 17   Ht 5\' 4"  (1.626 m)   Wt 138.5 kg (305 lb 5.4 oz)   SpO2 95%   BMI 52.41 kg/m??         Intake/Output Summary (Last 24 hours) at 06/10/2017 0917  Last data filed at 06/10/2017 0407  Gross per 24 hour   Intake 840 ml   Output 300 ml   Net 540 ml        Tmax:  Temp (24hrs), Avg:98 ??F (36.7 ??C), Min:97.2 ??F (36.2 ??C), Max:98.8 ??F (37.1 ??C)      Data Review:     Xr Chest Pa Lat    Result Date: 06/07/2017  CLINICAL HISTORY: Hypoxia INDICATION: Hypoxia COMPARISON: 10/27/2014  FINDINGS: PA and lateral views of the chest are obtained. The cardiopericardial silhouette is prominent. Diminished lung volumes. Pulmonary artery enlargement is suggested.. There is no pleural effusion, pneumothorax or focal consolidation present.     IMPRESSION: Mild cardiomegaly. Diminished lung volumes with pulmonary artery enlargement suggested.    No results found for: SDES  Lab Results   Component Value Date/Time    Culture result: MIXED SKIN FLORA ISOLATED 12/04/2012 04:45 PM     All Micro Results     Procedure Component Value Units Date/Time    URINE CULTURE HOLD SAMPLE [[id number]] Collected:  06/07/17 2100    Order Status:  Canceled Specimen:  Urine           Labs:     Recent Labs     06/10/17  0347 06/09/17  0346   WBC 6.2 6.8   HGB 12.7 12.9   HCT 39.6 41.9   PLT 132* 130*     Recent Labs     06/10/17  0347 06/09/17  0346 06/08/17  0400   NA 135* 135* 139   K 3.7 3.8 3.7   CL 103 103 107   CO2 26 27 24    BUN 23* 26* 22*   CREA 0.81 0.95 0.92   GLU 188* 178* 219*   CA 8.7 8.7 9.1   MG  --   --  1.9   PHOS  --   --  3.8     Recent Labs     06/08/17  0400   SGOT 14*   ALT 13   AP 63   TBILI 0.6   TP 7.1   ALB 3.0*   GLOB 4.1*     No results for input(s): INR, PTP, APTT in the last 72 hours.    No lab exists for component: INREXT, INREXT   No results for input(s): FE, TIBC, PSAT, FERR in the last 72 hours.   No results found for: FOL, RBCF   No results for input(s): PH, PCO2, PO2 in the last 72 hours.  Recent Labs  06/08/17  0400 06/07/17  1954   CPK 115  --    CKNDX 1.8  --    TROIQ <0.05 <0.05     Lab Results   Component Value Date/Time    Cholesterol, total 116 06/08/2017 04:00 AM    HDL Cholesterol 50 06/08/2017 04:00 AM    LDL, calculated 46.2 06/08/2017 04:00 AM    Triglyceride 99 06/08/2017 04:00 AM    CHOL/HDL Ratio 2.3 06/08/2017 04:00 AM     Lab Results   Component Value Date/Time    Glucose (POC) 174 (H) 06/10/2017 06:35 AM    Glucose (POC) 190 (H) 06/09/2017 09:07 PM     Glucose (POC) 162 (H) 06/09/2017 04:44 PM    Glucose (POC) 189 (H) 06/09/2017 12:18 PM    Glucose (POC) 182 (H) 06/09/2017 06:34 AM     Lab Results   Component Value Date/Time    Color YELLOW/STRAW 12/04/2012 04:45 PM    Appearance CLOUDY (A) 12/04/2012 04:45 PM    Specific gravity 1.020 12/04/2012 04:45 PM    pH (UA) 5.5 12/04/2012 04:45 PM    Protein NEGATIVE  12/04/2012 04:45 PM    Glucose NEGATIVE  12/04/2012 04:45 PM    Ketone NEGATIVE  12/04/2012 04:45 PM    Bilirubin NEGATIVE  12/04/2012 04:45 PM    Urobilinogen 0.2 12/04/2012 04:45 PM    Nitrites NEGATIVE  12/04/2012 04:45 PM    Leukocyte Esterase SMALL (A) 12/04/2012 04:45 PM    Epithelial cells MODERATE (A) 12/04/2012 04:45 PM    Bacteria 1+ (A) 12/04/2012 04:45 PM    WBC 5-10 12/04/2012 04:45 PM    RBC 0-5 12/04/2012 04:45 PM         Medications Reviewed:     Current Facility-Administered Medications   Medication Dose Route Frequency   ??? apixaban (ELIQUIS) tablet 10 mg  10 mg Oral Q12H   ??? [START ON 06/17/2017] apixaban (ELIQUIS) tablet 5 mg  5 mg Oral Q12H   ??? senna-docusate (PERICOLACE) 8.6-50 mg per tablet 1 Tab  1 Tab Oral DAILY   ??? aspirin delayed-release tablet 81 mg  81 mg Oral DAILY   ??? lisinopril (PRINIVIL, ZESTRIL) tablet 10 mg  10 mg Oral DAILY   ??? methIMAzole (TAPAZOLE) tablet 5 mg  5 mg Oral DAILY   ??? metoprolol tartrate (LOPRESSOR) tablet 50 mg  50 mg Oral BID   ??? nystatin (MYCOSTATIN) 100,000 unit/gram powder   Topical QID   ??? oxyCODONE-acetaminophen (PERCOCET 10) 10-325 mg per tablet 1 Tab  1 Tab Oral Q6H PRN   ??? rosuvastatin (CRESTOR) tablet 20 mg  20 mg Oral QHS   ??? triamterene-hydroCHLOROthiazide (MAXZIDE) 37.5-25 mg per tablet 1 Tab  1 Tab Oral DAILY   ??? sodium chloride (NS) flush 5-40 mL  5-40 mL IntraVENous Q8H   ??? sodium chloride (NS) flush 5-40 mL  5-40 mL IntraVENous PRN   ??? ondansetron (ZOFRAN) injection 4 mg  4 mg IntraVENous Q4H PRN   ??? bisacodyl (DULCOLAX) tablet 5 mg  5 mg Oral DAILY PRN    ??? insulin lispro (HUMALOG) injection   SubCUTAneous AC&HS   ??? glucose chewable tablet 16 g  4 Tab Oral PRN   ??? dextrose (D50W) injection syrg 12.5-25 g  12.5-25 g IntraVENous PRN   ??? glucagon (GLUCAGEN) injection 1 mg  1 mg IntraMUSCular PRN   ??? arformoterol (BROVANA) neb solution 15 mcg  15 mcg Nebulization BID RT    And   ??? budesonide (PULMICORT) 500  mcg/2 ml nebulizer suspension  500 mcg Nebulization BID RT   ??? acetaminophen (TYLENOL) tablet 650 mg  650 mg Oral Q4H PRN   ??? albuterol-ipratropium (DUO-NEB) 2.5 MG-0.5 MG/3 ML  3 mL Nebulization Q4H PRN     ______________________________________________________________________  EXPECTED LENGTH OF STAY: 2d 9h  ACTUAL LENGTH OF STAY:          3                 Rulon AbideZeeshan Milton Sagona, MD

## 2017-06-10 NOTE — Progress Notes (Addendum)
Problem: Falls - Risk of  Goal: *Absence of Falls  Description  Document Bridgette HabermannSchmid Fall Risk and appropriate interventions in the flowsheet.  Outcome: Progressing Towards Goal   Patient calls for assistance when needed. Hourly rounds performed.    Bedside shift change report given to Tobi BastosAnna Read  (oncoming nurse) by Jodelle GreenWhitley RN (offgoing nurse). Report included the following information SBAR, Kardex, Intake/Output, MAR, Accordion and Recent Results.

## 2017-06-11 ENCOUNTER — Encounter

## 2017-06-11 LAB — CBC WITH AUTOMATED DIFF
ABS. BASOPHILS: 0 10*3/uL (ref 0.0–0.1)
ABS. EOSINOPHILS: 0.3 10*3/uL (ref 0.0–0.4)
ABS. IMM. GRANS.: 0 10*3/uL (ref 0.00–0.04)
ABS. LYMPHOCYTES: 1.9 10*3/uL (ref 0.8–3.5)
ABS. MONOCYTES: 0.7 10*3/uL (ref 0.0–1.0)
ABS. NEUTROPHILS: 3 10*3/uL (ref 1.8–8.0)
ABSOLUTE NRBC: 0 10*3/uL (ref 0.00–0.01)
BASOPHILS: 0 % (ref 0–1)
EOSINOPHILS: 4 % (ref 0–7)
HCT: 41.5 % (ref 35.0–47.0)
HGB: 13.2 g/dL (ref 11.5–16.0)
IMMATURE GRANULOCYTES: 0 % (ref 0.0–0.5)
LYMPHOCYTES: 32 % (ref 12–49)
MCH: 29.3 PG (ref 26.0–34.0)
MCHC: 31.8 g/dL (ref 30.0–36.5)
MCV: 92 FL (ref 80.0–99.0)
MONOCYTES: 11 % (ref 5–13)
MPV: 12.1 FL (ref 8.9–12.9)
NEUTROPHILS: 53 % (ref 32–75)
NRBC: 0 PER 100 WBC
PLATELET: 144 10*3/uL — ABNORMAL LOW (ref 150–400)
RBC: 4.51 M/uL (ref 3.80–5.20)
RDW: 13.7 % (ref 11.5–14.5)
WBC: 5.8 10*3/uL (ref 3.6–11.0)

## 2017-06-11 LAB — METABOLIC PANEL, BASIC
Anion gap: 7 mmol/L (ref 5–15)
BUN/Creatinine ratio: 28 — ABNORMAL HIGH (ref 12–20)
BUN: 27 MG/DL — ABNORMAL HIGH (ref 6–20)
CO2: 24 mmol/L (ref 21–32)
Calcium: 9.1 MG/DL (ref 8.5–10.1)
Chloride: 103 mmol/L (ref 97–108)
Creatinine: 0.97 MG/DL (ref 0.55–1.02)
GFR est AA: >60 mL/min/{1.73_m2} (ref >60)
GFR est non-AA: 56 mL/min/{1.73_m2} — ABNORMAL LOW (ref >60)
Glucose: 155 mg/dL — ABNORMAL HIGH (ref 65–100)
Potassium: 4.2 mmol/L (ref 3.5–5.1)
Sodium: 134 mmol/L — ABNORMAL LOW (ref 136–145)

## 2017-06-11 LAB — GLUCOSE, POC
Glucose (POC): 163 mg/dL — ABNORMAL HIGH (ref 65–100)
Glucose (POC): 159 mg/dL — ABNORMAL HIGH (ref 65–100)
Glucose (POC): 187 mg/dL — ABNORMAL HIGH (ref 65–100)

## 2017-06-11 MED ORDER — APIXABAN 5 MG TABLET
5 mg | ORAL_TABLET | ORAL | 0 refills | Status: DC
Start: 2017-06-11 — End: 2017-06-26

## 2017-06-11 MED ORDER — APIXABAN 5 MG TABLET
5 mg | ORAL_TABLET | Freq: Two times a day (BID) | ORAL | 0 refills | Status: DC
Start: 2017-06-11 — End: 2017-06-11

## 2017-06-11 MED FILL — ROSUVASTATIN 10 MG TAB: 10 mg | ORAL | Qty: 2

## 2017-06-11 MED FILL — NORMAL SALINE FLUSH 0.9 % INJECTION SYRINGE: INTRAMUSCULAR | Qty: 10

## 2017-06-11 MED FILL — INSULIN LISPRO 100 UNIT/ML INJECTION: 100 unit/mL | SUBCUTANEOUS | Qty: 1

## 2017-06-11 MED FILL — SENOKOT-S 8.6 MG-50 MG TABLET: ORAL | Qty: 1

## 2017-06-11 MED FILL — ELIQUIS 5 MG TABLET: 5 mg | ORAL | Qty: 2

## 2017-06-11 MED FILL — TRIAMTERENE-HYDROCHLOROTHIAZIDE 37.5 MG-25 MG TAB: ORAL | Qty: 1

## 2017-06-11 MED FILL — BROVANA 15 MCG/2 ML SOLUTION FOR NEBULIZATION: 15 mcg/2 mL | RESPIRATORY_TRACT | Qty: 1

## 2017-06-11 MED FILL — METHIMAZOLE 5 MG TAB: 5 mg | ORAL | Qty: 1

## 2017-06-11 MED FILL — ASPIRIN 81 MG TAB, DELAYED RELEASE: 81 mg | ORAL | Qty: 1

## 2017-06-11 MED FILL — NYSTOP 100,000 UNIT/GRAM TOPICAL POWDER: 100000 unit/gram | CUTANEOUS | Qty: 15

## 2017-06-11 MED FILL — BUDESONIDE 0.5 MG/2 ML NEB SUSPENSION: 0.5 mg/2 mL | RESPIRATORY_TRACT | Qty: 1

## 2017-06-11 MED FILL — METOPROLOL TARTRATE 25 MG TAB: 25 mg | ORAL | Qty: 2

## 2017-06-11 MED FILL — GAS RELIEF (SIMETHICONE) 80 MG CHEWABLE TABLET: 80 mg | ORAL | Qty: 1

## 2017-06-11 MED FILL — LISINOPRIL 10 MG TAB: 10 mg | ORAL | Qty: 1

## 2017-06-11 MED FILL — OXYCODONE-ACETAMINOPHEN 10 MG-325 MG TAB: 10-325 mg | ORAL | Qty: 1

## 2017-06-11 NOTE — Discharge Summary (Addendum)
Inpatient hospitalist discharge summary                Brief Overview    PATIENT ID: Allison Newman    MRN: [id number]     DATE OF BIRTH: 11/18/1940    Admitting Provider: Astrid Drafts, MD    Discharging Provider: Donato Schultz, MD   To contact this individual call [phone] and ask the operator to page.  If unavailable ask to be transferred the Adult Hospitalist Department.      PCP at discharge: Karlyn Agee, [place]   [place] / [place] [address]    Admission date: 06/07/2017  Date of Discharge: 06/11/17    Chief complaint:   Chief Complaint   Patient presents with   ??? Shortness of Breath     Patient Active Problem List   Diagnosis Code   ??? Essential hypertension I10   ??? 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, adult ([place]) E66.01, Z68.44   ??? Neuropathic arthritis M14.60   ??? Low TSH level R79.89   ??? Acid reflux K21.9   ??? Hypercholesterolemia E78.00   ??? Rash in adult R21   ??? Lung nodule seen on imaging study R91.1   ??? 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 Chronic right hip pain M25.551, G89.29   ??? Gallbladder polyp K82.4   ??? Umbilical hernia T24.8   ??? Microalbuminuria R80.9   ??? Abnormal finding on EKG R94.31   ??? Slow transit constipation K59.01   ??? Chronic pain of left knee M25.562, G89.29   ??? Osteopenia M85.80   ??? ACP (advance care planning) Z71.89   ??? 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 Morbid obesity (HCC) E66.01   ??? Acute CHF (congestive heart failure) (HCC) I50.9   ??? Acute pulmonary embolism (HCC) I26.99   ??? DM2 (diabetes mellitus, type 2) (HCC) E11.9   ??? Hyperthyroidism E05.90         Primary discharge diagnosis:  Acute hypoxemic respiratory failure sec to Bilateral PE, POA  NO CHF NOTED    Secondary discharge diagnosis:  Morbid obesity  HTN  HLP  NIDDM2  Hyperthyroidism  Lung nodule, unchanged since 2014  Goiter       Discharge Disposition:  Home or Self Care with HHN/PT/OT    Discharge activity:  Ambulate in house    Code status at discharge:  Full Code     Active issues requiring follow up:  Acute PE  Needs age appropriate cancer screening  May need sleep studies    Outpatient follow up:      Future appointments-  Future Appointments   Date Time Provider [place]   08/28/2017 10:45 AM Karlyn Agee, MD [place]     Follow-up Information     Follow up With Specialties Details Why Contact Info    Karlyn Agee, MD Internal Medicine   [address]  [place]  [place] [address]  [phone]      Karlyn Agee, MD Internal Medicine Schedule an appointment as soon as possible for a visit in 1 week for follow Up, as recommended. discuss Pap smear, mammogram and other agre appropriate screening [address]  [address]  [place] [address]  [phone]            Test results  pending upon discharge:  n/a          Details of hospital stay      Presenting problem/ History of present illness:  Acute CHF (congestive heart failure) ([place]) [I50.9]    Allison Newman is a 77 y.o. female  Presented with SOB.    Hospital Course:  Acute hypoxemic respiratory failure sec to Bilateral PE, POA  O2 sats improved  NIV neg for DVT but limited studies technically.  No obvious signs/symptoms of Pneumonia, no CHF-lasix stopped  started on Eliquis 10 mg PO BID till 06/16/17, and then 90m po BID from 06/17/17- patient advised to get age appropriate cancer screening  PT/OT  Patient needs outpatient sleep studies  ??  Thrombocytopenia  No bleeding  stable  ??  There was no CHF noted  ??  NIDDM2 with hyperglycemia  HbA1c is 7.7  Diabetic diet  accu-checks and SSI TID  ??  HTN  Cont home meds  ??  Hyperthyroidism  TSH stable  Cont tapazole  ??  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  Goiter   Unchanged compared to CT done on 06/25/12  ??  Lung Nodule  Right apical  Unchanged compared to CT since 2014  Needs follow up    On the date of discharge, diagnostic face to face encounter was performed.  Patient was hemodynamically stable, offering no new complaints. Denies any shortness of breath at rest, no fevers or chills, no diarrhea or constipation. Patient is agreeable for discharge. Patient understood and verbalized the understanding of the discharge plan.    Patient was advised to seek medical help/ care or return to ED, if symptoms recur, worsen or new symptoms develop.      Operative procedures performed:      Treatments: IV hydration and Lovenox, lasix and Eliquis    Consults:  IP CONSULT TO CARDIOLOGY    Procedures: none       Diet:  DIET DIABETIC CONSISTENT CARB    Pertinent test results:  Xr Chest Pa Lat    Result Date: 06/07/2017  CLINICAL HISTORY: Hypoxia INDICATION: Hypoxia COMPARISON: 10/27/2014 FINDINGS: PA and lateral views of the chest are obtained. The cardiopericardial silhouette is prominent. Diminished lung volumes. Pulmonary artery enlargement is suggested.. There is no pleural effusion, pneumothorax or focal consolidation present.     IMPRESSION: Mild cardiomegaly. Diminished lung volumes with pulmonary artery enlargement suggested.    Cta Chest W Or W Wo Cont    Result Date: 06/08/2017  EXAM:  CTA CHEST W OR W WO CONT INDICATION:  shortness of breath, PE, COMPARISON: PROCEDURE: During rapid intravenous bolus infusion 97 ml Isovue 370 thin spiral axial images were obtained through the chest. Coronal MIP reconstructions of the pulmonary arteries as well as sagittal and coronal standard reconstruction were obtained for evaluation of the pulmonary arteries. Standard axial soft issue and lung windows.  CT dose reduction was achieved through use of a standardized protocol tailored for this examination and automatic exposure control for dose modulation.  FINDINGS: Multiple intraluminal filling defects in pulmonary arteries consistent with pulmonary emboli. There is no hilar, mediastinal or axillary adenopathy. Again demonstrated is an upper thoracic goiter which is displacing the trachea to the left and causing mild compression. Minimal atelectasis left base. In the right upper lobe apical region is an approximately 5 mm nodule which is unchanged in size when compared on coronal images  with the prior chest CT 06/25/12. No other parenchymal nodules, lesions, infiltrates demonstrated. Imaging through the upper abdomen is unremarkable. Scoliosis and mild chronic degenerative changes in the thoracic spine again noted.     IMPRESSION: 1. Multiple pulmonary emboli to both lungs. 2. Again demonstrated is upper thoracic goiter with mild displacement and compression of the trachea similar to 06/25/12. 3. No change in approximately 5 mm right apical pulmonary nodule since 2014.   789 The findings were called to hospitalist by me at 11:30 AM      Recent Results (from the past 336 hour(s))   EKG, 12 LEAD, INITIAL    Collection Time: 06/07/17  7:49 PM   Result Value Ref Range    Ventricular Rate 79 BPM    Atrial Rate 79 BPM    P-R Interval 170 ms    QRS Duration 86 ms    Q-T Interval 372 ms    QTC Calculation (Bezet) 426 ms    Calculated P Axis 52 degrees    Calculated R Axis 25 degrees    Calculated T Axis -38 degrees    Diagnosis       Normal sinus rhythm  T wave abnormality, consider inferior ischemia  T wave abnormality, consider anterolateral ischemia  When compared with ECG of 11-Nov-2013 13:17,  Inverted T waves have replaced nonspecific T wave abnormality in   Anterolateral leads  Confirmed by Meda Coffee, M.D., Juanda Crumble [phone]) on 06/08/2017 8:20:39 AM     SAMPLES BEING HELD    Collection Time: 06/07/17  7:54 PM   Result Value Ref Range    SAMPLES BEING HELD 1PST 1BLU 1RED 1LAV     COMMENT        Add-on orders for these samples will be processed based on acceptable  specimen integrity and analyte stability, which may vary by analyte.   CBC WITH AUTOMATED DIFF    Collection Time: 06/07/17  7:54 PM   Result Value Ref Range    WBC 7.3 3.6 - 11.0 K/uL    RBC 4.54 3.80 - 5.20 M/uL    HGB 13.1 11.5 - 16.0 g/dL    HCT 42.2 35.0 - 47.0 %    MCV 93.0 80.0 - 99.0 FL    MCH 28.9 26.0 - 34.0 PG    MCHC 31.0 30.0 - 36.5 g/dL    RDW 14.1 11.5 - 14.5 %    PLATELET 137 (L) 150 - 400 K/uL    MPV 12.6 8.9 - 12.9 FL    NRBC 0.0 0 PER 100 WBC    ABSOLUTE NRBC 0.00 0.00 - 0.01 K/uL    NEUTROPHILS 64 32 - 75 %    LYMPHOCYTES 23 12 - 49 %    MONOCYTES 9 5 - 13 %    EOSINOPHILS 3 0 - 7 %    BASOPHILS 1 0 - 1 %    IMMATURE GRANULOCYTES 0 0.0 - 0.5 %    ABS. NEUTROPHILS 4.7 1.8 - 8.0 K/UL    ABS. LYMPHOCYTES 1.7 0.8 - 3.5 K/UL    ABS. MONOCYTES 0.7 0.0 - 1.0 K/UL    ABS. EOSINOPHILS 0.2 0.0 - 0.4 K/UL    ABS. BASOPHILS 0.0 0.0 - 0.1 K/UL    ABS. IMM. GRANS. 0.0 0.00 - 0.04 K/UL    DF AUTOMATED     METABOLIC PANEL, BASIC    Collection Time: 06/07/17  7:54 PM   Result Value Ref Range    Sodium 142 136 - 145 mmol/L  Potassium 4.0 3.5 - 5.1 mmol/L    Chloride 109 (H) 97 - 108 mmol/L    CO2 26 21 - 32 mmol/L    Anion gap 7 5 - 15 mmol/L    Glucose 161 (H) 65 - 100 mg/dL    BUN 24 (H) 6 - 20 MG/DL    Creatinine 0.97 0.55 - 1.02 MG/DL    BUN/Creatinine ratio 25 (H) 12 - 20      GFR est AA >60 >60 ml/min/1.13m    GFR est non-AA 56 (L) >60 ml/min/1.766m   Calcium 9.4 8.5 - 10.1 MG/DL   NT-PRO BNP    Collection Time: 06/07/17  7:54 PM   Result Value Ref Range    NT pro-BNP 1,164 (H) <125 PG/ML   TROPONIN I    Collection Time: 06/07/17  7:54 PM   Result Value Ref Range    Troponin-I, Qt. <0.05 <0.05 ng/mL   TSH 3RD GENERATION    Collection Time: 06/08/17  3:50 AM   Result Value Ref Range    TSH 1.33 0.36 - 3.3.42IU/mL   METABOLIC PANEL, COMPREHENSIVE    Collection Time: 06/08/17  4:00 AM   Result Value Ref Range    Sodium 139 136 - 145 mmol/L    Potassium 3.7 3.5 - 5.1 mmol/L    Chloride 107 97 - 108 mmol/L     CO2 24 21 - 32 mmol/L    Anion gap 8 5 - 15 mmol/L    Glucose 219 (H) 65 - 100 mg/dL    BUN 22 (H) 6 - 20 MG/DL    Creatinine 0.92 0.55 - 1.02 MG/DL    BUN/Creatinine ratio 24 (H) 12 - 20      GFR est AA >60 >60 ml/min/1.7332m  GFR est non-AA 60 (L) >60 ml/min/1.11m41m Calcium 9.1 8.5 - 10.1 MG/DL    Bilirubin, total 0.6 0.2 - 1.0 MG/DL    ALT (SGPT) 13 12 - 78 U/L    AST (SGOT) 14 (L) 15 - 37 U/L    Alk. phosphatase 63 45 - 117 U/L    Protein, total 7.1 6.4 - 8.2 g/dL    Albumin 3.0 (L) 3.5 - 5.0 g/dL    Globulin 4.1 (H) 2.0 - 4.0 g/dL    A-G Ratio 0.7 (L) 1.1 - 2.2     LIPID PANEL    Collection Time: 06/08/17  4:00 AM   Result Value Ref Range    LIPID PROFILE          Cholesterol, total 116 <200 MG/DL    Triglyceride 99 <150 MG/DL    HDL Cholesterol 50 MG/DL    LDL, calculated 46.2 0 - 100 MG/DL    VLDL, calculated 19.8 MG/DL    CHOL/HDL Ratio 2.3 0.0 - 5.0     CBC WITH AUTOMATED DIFF    Collection Time: 06/08/17  4:00 AM   Result Value Ref Range    WBC 6.6 3.6 - 11.0 K/uL    RBC 4.44 3.80 - 5.20 M/uL    HGB 12.7 11.5 - 16.0 g/dL    HCT 41.0 35.0 - 47.0 %    MCV 92.3 80.0 - 99.0 FL    MCH 28.6 26.0 - 34.0 PG    MCHC 31.0 30.0 - 36.5 g/dL    RDW 14.0 11.5 - 14.5 %    PLATELET 128 (L) 150 - 400 K/uL    MPV 12.1 8.9 - 12.9 FL  NRBC 0.0 0 PER 100 WBC    ABSOLUTE NRBC 0.00 0.00 - 0.01 K/uL    NEUTROPHILS 59 32 - 75 %    LYMPHOCYTES 27 12 - 49 %    MONOCYTES 9 5 - 13 %    EOSINOPHILS 4 0 - 7 %    BASOPHILS 1 0 - 1 %    IMMATURE GRANULOCYTES 0 0.0 - 0.5 %    ABS. NEUTROPHILS 3.9 1.8 - 8.0 K/UL    ABS. LYMPHOCYTES 1.8 0.8 - 3.5 K/UL    ABS. MONOCYTES 0.6 0.0 - 1.0 K/UL    ABS. EOSINOPHILS 0.2 0.0 - 0.4 K/UL    ABS. BASOPHILS 0.0 0.0 - 0.1 K/UL    ABS. IMM. GRANS. 0.0 0.00 - 0.04 K/UL    DF AUTOMATED     MAGNESIUM    Collection Time: 06/08/17  4:00 AM   Result Value Ref Range    Magnesium 1.9 1.6 - 2.4 mg/dL   PHOSPHORUS    Collection Time: 06/08/17  4:00 AM   Result Value Ref Range    Phosphorus 3.8 2.6 - 4.7 MG/DL    TROPONIN I    Collection Time: 06/08/17  4:00 AM   Result Value Ref Range    Troponin-I, Qt. <0.05 <0.05 ng/mL   CK W/ CKMB & INDEX    Collection Time: 06/08/17  4:00 AM   Result Value Ref Range    CK 115 26 - 192 U/L    CK - MB 2.1 <3.6 NG/ML    CK-MB Index 1.8 0.0 - 2.5     HEMOGLOBIN A1C WITH EAG    Collection Time: 06/08/17  4:05 AM   Result Value Ref Range    Hemoglobin A1c 7.7 (H) 4.2 - 6.3 %    Est. average glucose 174 mg/dL   GLUCOSE, POC    Collection Time: 06/08/17  6:23 AM   Result Value Ref Range    Glucose (POC) 181 (H) 65 - 100 mg/dL    Performed by LUNA CYNTHIA    GLUCOSE, POC    Collection Time: 06/08/17 11:46 AM   Result Value Ref Range    Glucose (POC) 179 (H) 65 - 100 mg/dL    Performed by PROTHEROE [place]    ECHO ADULT COMPLETE    Collection Time: 06/08/17  4:44 PM   Result Value Ref Range    Tapse 2.11 (A) 1.5 - 2.0 cm    Ao Root D 3.33 cm    PASP 45.0 mmHg   GLUCOSE, POC    Collection Time: 06/08/17  4:54 PM   Result Value Ref Range    Glucose (POC) 169 (H) 65 - 100 mg/dL    Performed by Dolores Frame    GLUCOSE, POC    Collection Time: 06/08/17  9:13 PM   Result Value Ref Range    Glucose (POC) 219 (H) 65 - 100 mg/dL    Performed by Dolores Frame    CBC WITH AUTOMATED DIFF    Collection Time: 06/09/17  3:46 AM   Result Value Ref Range    WBC 6.8 3.6 - 11.0 K/uL    RBC 4.52 3.80 - 5.20 M/uL    HGB 12.9 11.5 - 16.0 g/dL    HCT 41.9 35.0 - 47.0 %    MCV 92.7 80.0 - 99.0 FL    MCH 28.5 26.0 - 34.0 PG    MCHC 30.8 30.0 - 36.5 g/dL    RDW 14.1 11.5 - 14.5 %  PLATELET 130 (L) 150 - 400 K/uL    MPV 12.5 8.9 - 12.9 FL    NRBC 0.0 0 PER 100 WBC    ABSOLUTE NRBC 0.00 0.00 - 0.01 K/uL    NEUTROPHILS 53 32 - 75 %    LYMPHOCYTES 32 12 - 49 %    MONOCYTES 11 5 - 13 %    EOSINOPHILS 3 0 - 7 %    BASOPHILS 1 0 - 1 %    IMMATURE GRANULOCYTES 0 0.0 - 0.5 %    ABS. NEUTROPHILS 3.7 1.8 - 8.0 K/UL    ABS. LYMPHOCYTES 2.2 0.8 - 3.5 K/UL    ABS. MONOCYTES 0.7 0.0 - 1.0 K/UL     ABS. EOSINOPHILS 0.2 0.0 - 0.4 K/UL    ABS. BASOPHILS 0.0 0.0 - 0.1 K/UL    ABS. IMM. GRANS. 0.0 0.00 - 0.04 K/UL    DF AUTOMATED     METABOLIC PANEL, BASIC    Collection Time: 06/09/17  3:46 AM   Result Value Ref Range    Sodium 135 (L) 136 - 145 mmol/L    Potassium 3.8 3.5 - 5.1 mmol/L    Chloride 103 97 - 108 mmol/L    CO2 27 21 - 32 mmol/L    Anion gap 5 5 - 15 mmol/L    Glucose 178 (H) 65 - 100 mg/dL    BUN 26 (H) 6 - 20 MG/DL    Creatinine 0.95 0.55 - 1.02 MG/DL    BUN/Creatinine ratio 27 (H) 12 - 20      GFR est AA >60 >60 ml/min/1.28m    GFR est non-AA 58 (L) >60 ml/min/1.734m   Calcium 8.7 8.5 - 10.1 MG/DL   T4, FREE    Collection Time: 06/09/17  3:46 AM   Result Value Ref Range    T4, Free 1.2 0.8 - 1.5 NG/DL   GLUCOSE, POC    Collection Time: 06/09/17  6:34 AM   Result Value Ref Range    Glucose (POC) 182 (H) 65 - 100 mg/dL    Performed by Richardson InNiger PCT    GLUCOSE, POC    Collection Time: 06/09/17 12:18 PM   Result Value Ref Range    Glucose (POC) 189 (H) 65 - 100 mg/dL    Performed by [employer]  GLUCOSE, POC    Collection Time: 06/09/17  4:44 PM   Result Value Ref Range    Glucose (POC) 162 (H) 65 - 100 mg/dL    Performed by Unkown Operator    GLUCOSE, POC    Collection Time: 06/09/17  9:07 PM   Result Value Ref Range    Glucose (POC) 190 (H) 65 - 100 mg/dL    Performed by WiFayrene Fearing  CBC WITH AUTOMATED DIFF    Collection Time: 06/10/17  3:47 AM   Result Value Ref Range    WBC 6.2 3.6 - 11.0 K/uL    RBC 4.32 3.80 - 5.20 M/uL    HGB 12.7 11.5 - 16.0 g/dL    HCT 39.6 35.0 - 47.0 %    MCV 91.7 80.0 - 99.0 FL    MCH 29.4 26.0 - 34.0 PG    MCHC 32.1 30.0 - 36.5 g/dL    RDW 13.6 11.5 - 14.5 %    PLATELET 132 (L) 150 - 400 K/uL    MPV 12.6 8.9 - 12.9 FL    NRBC 0.0 0 PER 100  WBC    ABSOLUTE NRBC 0.00 0.00 - 0.01 K/uL    NEUTROPHILS 55 32 - 75 %    LYMPHOCYTES 30 12 - 49 %    MONOCYTES 10 5 - 13 %    EOSINOPHILS 4 0 - 7 %    BASOPHILS 1 0 - 1 %     IMMATURE GRANULOCYTES 0 0.0 - 0.5 %    ABS. NEUTROPHILS 3.4 1.8 - 8.0 K/UL    ABS. LYMPHOCYTES 1.9 0.8 - 3.5 K/UL    ABS. MONOCYTES 0.6 0.0 - 1.0 K/UL    ABS. EOSINOPHILS 0.3 0.0 - 0.4 K/UL    ABS. BASOPHILS 0.0 0.0 - 0.1 K/UL    ABS. IMM. GRANS. 0.0 0.00 - 0.04 K/UL    DF AUTOMATED     METABOLIC PANEL, BASIC    Collection Time: 06/10/17  3:47 AM   Result Value Ref Range    Sodium 135 (L) 136 - 145 mmol/L    Potassium 3.7 3.5 - 5.1 mmol/L    Chloride 103 97 - 108 mmol/L    CO2 26 21 - 32 mmol/L    Anion gap 6 5 - 15 mmol/L    Glucose 188 (H) 65 - 100 mg/dL    BUN 23 (H) 6 - 20 MG/DL    Creatinine 0.81 0.55 - 1.02 MG/DL    BUN/Creatinine ratio 28 (H) 12 - 20      GFR est AA >60 >60 ml/min/1.52m    GFR est non-AA >60 >60 ml/min/1.715m   Calcium 8.7 8.5 - 10.1 MG/DL   GLUCOSE, POC    Collection Time: 06/10/17  6:35 AM   Result Value Ref Range    Glucose (POC) 174 (H) 65 - 100 mg/dL    Performed by WiFayrene Fearing  GLUCOSE, POC    Collection Time: 06/10/17 11:23 AM   Result Value Ref Range    Glucose (POC) 168 (H) 65 - 100 mg/dL    Performed by HeLorne Skeens  GLUCOSE, POC    Collection Time: 06/10/17  4:34 PM   Result Value Ref Range    Glucose (POC) 164 (H) 65 - 100 mg/dL    Performed by EdOletta Lamasntionette    GLUCOSE, POC    Collection Time: 06/10/17  8:39 PM   Result Value Ref Range    Glucose (POC) 163 (H) 65 - 100 mg/dL    Performed by AlGlade Lloyd  CBC WITH AUTOMATED DIFF    Collection Time: 06/11/17  5:11 AM   Result Value Ref Range    WBC 5.8 3.6 - 11.0 K/uL    RBC 4.51 3.80 - 5.20 M/uL    HGB 13.2 11.5 - 16.0 g/dL    HCT 41.5 35.0 - 47.0 %    MCV 92.0 80.0 - 99.0 FL    MCH 29.3 26.0 - 34.0 PG    MCHC 31.8 30.0 - 36.5 g/dL    RDW 13.7 11.5 - 14.5 %    PLATELET 144 (L) 150 - 400 K/uL    MPV 12.1 8.9 - 12.9 FL    NRBC 0.0 0 PER 100 WBC    ABSOLUTE NRBC 0.00 0.00 - 0.01 K/uL    NEUTROPHILS 53 32 - 75 %    LYMPHOCYTES 32 12 - 49 %    MONOCYTES 11 5 - 13 %    EOSINOPHILS 4 0 - 7 %    BASOPHILS 0 0 - 1 %  IMMATURE GRANULOCYTES 0 0.0 - 0.5 %    ABS. NEUTROPHILS 3.0 1.8 - 8.0 K/UL    ABS. LYMPHOCYTES 1.9 0.8 - 3.5 K/UL    ABS. MONOCYTES 0.7 0.0 - 1.0 K/UL    ABS. EOSINOPHILS 0.3 0.0 - 0.4 K/UL    ABS. BASOPHILS 0.0 0.0 - 0.1 K/UL    ABS. IMM. GRANS. 0.0 0.00 - 0.04 K/UL    DF AUTOMATED     METABOLIC PANEL, BASIC    Collection Time: 06/11/17  5:11 AM   Result Value Ref Range    Sodium 134 (L) 136 - 145 mmol/L    Potassium 4.2 3.5 - 5.1 mmol/L    Chloride 103 97 - 108 mmol/L    CO2 24 21 - 32 mmol/L    Anion gap 7 5 - 15 mmol/L    Glucose 155 (H) 65 - 100 mg/dL    BUN 27 (H) 6 - 20 MG/DL    Creatinine 0.97 0.55 - 1.02 MG/DL    BUN/Creatinine ratio 28 (H) 12 - 20      GFR est AA >60 >60 ml/min/1.36m    GFR est non-AA 56 (L) >60 ml/min/1.751m   Calcium 9.1 8.5 - 10.1 MG/DL   GLUCOSE, POC    Collection Time: 06/11/17  7:03 AM   Result Value Ref Range    Glucose (POC) 159 (H) 65 - 100 mg/dL    Performed by AlGlade Lloyd          Physical Exam on Discharge:    Discharge condition: fair    Vital signs:   Patient Vitals for the past 12 hrs:   Temp Pulse Resp BP SpO2   06/11/17 0803 ??? ??? ??? ??? 94 %   06/11/17 0718 97.7 ??F (36.5 ??C) 69 16 135/58 95 %   06/11/17 0511 97.3 ??F (36.3 ??C) 69 20 155/75 97 %   06/10/17 2340 ??? 73 ??? 109/83 ???   06/10/17 2328 98.2 ??F (36.8 ??C) 73 18 96/48 90 %       General appearance: alert, cooperative, no distress, appears stated age  Lungs: clear to auscultation bilaterally  Heart: regular rate and rhythm, S1, S2 normal, no murmur, click, rub or gallop    Current Discharge Medication List      START taking these medications    Details   apixaban (ELIQUIS) 5 mg tablet Take 10 mg (2 tabs) Orally twice a day till 06/16/17, and then start 5 mg (1 one tab) twice a day from 06/17/17  Qty: 72 Tab, Refills: 0         CONTINUE these medications which have NOT CHANGED    Details   lisinopril (PRINIVIL, ZESTRIL) 10 mg tablet Take 1 Tab by mouth daily.  Qty: 90 Tab, Refills: 1     Comments: **Patient requests 90 days supply**  Associated Diagnoses: HTN, goal below 140/90      metFORMIN (GLUCOPHAGE) 1,000 mg tablet Take 1 Tab by mouth two (2) times daily (with meals).  Qty: 180 Tab, Refills: 1      methIMAzole (TAPAZOLE) 5 mg tablet Take 1 Tab by mouth daily.  Qty: 90 Tab, Refills: 1      metoprolol tartrate (LOPRESSOR) 50 mg tablet Take 1 Tab by mouth two (2) times a day.  Qty: 180 Tab, Refills: 1    Comments: **Patient requests 90 days supply**  Associated Diagnoses: HTN, goal below 140/90      rosuvastatin (CRESTOR) 20 mg tablet Take  1 Tab by mouth nightly.  Qty: 90 Tab, Refills: 1    Associated Diagnoses: Diabetes mellitus without complication ([place]); Hypercholesterolemia; Microalbuminuria; Elevated BUN      triamterene-hydroCHLOROthiazide (DYAZIDE) 37.5-25 mg per capsule TAKE 1 CAPSULE BY MOUTH EVERY DAY  Qty: 90 Cap, Refills: 1    Associated Diagnoses: HTN, goal below 140/90      BREO ELLIPTA 200-25 mcg/dose inhaler INHALE 1 PUFF BY MOUTH DAILY  Qty: 1 Each, Refills: 5    Associated Diagnoses: COPD (chronic obstructive pulmonary disease) with chronic bronchitis ([place])      !! lancets misc Use BID. Dx: E11.9  Qty: 100 Each, Refills: 11    Associated Diagnoses: Uncontrolled type 2 diabetes mellitus with hyperglycemia ([facility])      CALCIUM 600 + D tablet take 1 tablet by mouth twice a day  Qty: 60 Tab, Refills: 11      JANUMET 50-500 mg per tablet take 1 tablet by mouth once daily  Qty: 30 Tab, Refills: 1    Associated Diagnoses: Diabetes mellitus without complication ([place]); Hypercholesterolemia; Microalbuminuria; Elevated BUN      aspirin delayed-release 81 mg tablet Take 1 Tab by mouth daily.  Qty: 30 Tab, Refills: 11    Associated Diagnoses: On aspirin at home      oxyCODONE-acetaminophen (PERCOCET 10) 10-325 mg per tablet take 1/2 TO 1 tablet once daily if needed for pain  Refills: 0      glucose blood VI test strips (BLOOD GLUCOSE TEST) strip Use BID DxE11.9  Qty: 100 Strip, Refills: 11     Associated Diagnoses: Uncontrolled type 2 diabetes mellitus with hyperglycemia (H[facility])      nystatin (MYCOSTATIN) powder Apply  to affected area four (4) times daily.  Qty: 120 g, Refills: 3    Associated Diagnoses: Skin candidiasis      aspirin-calcium carbonate 81 mg-300 mg calcium(777 mg) tab Take 81 mg by mouth.      !! FREESTYLE LANCETS 28 gauge misc Refills: 1      cyanocobalamin 1,000 mcg tablet Take 1,000 mcg by mouth daily.       !! - Potential duplicate medications found. Please discuss with provider.            Total time spent on discharge planning, counseling and co-ordination of care:   33 minutes    Donato Schultz, MD  06/11/17  9:00 AM        NOTIFY YOUR PHYSICIAN FOR ANY OF THE FOLLOWING:   Fever over 101 degrees for 24 hours.   Chest pain, shortness of breath, fever, chills, nausea, vomiting, diarrhea, change in mentation, falling, weakness, bleeding. Severe pain or pain not relieved by medications.  Or, any other signs or symptoms that you may have questions about.

## 2017-06-11 NOTE — Progress Notes (Signed)
Cardiology Progress Note     ??  ??     Admit Date: 06/07/2017  Admit Diagnosis: Acute CHF (congestive heart failure) (HCC) [I50.9]  Date: 06/11/2017     Time: 10:28 AM  ??  ??  ??   Assessment and Plan   ??  1.. Acute bilateral PE  -Eliquis per primary   -Echo with NL ef but RV mod dilated.  Needs follow up in 1 month with repeat echo.  Pt requests to follow up with Dr.Sabharwal.  ??  3??Hx of HTN: BP controlled  -continue lisinopril, triamterene/HCTZ, lopressor 50 mg BID  ??  4. Hx of abnormal stress test:  -Fixed inferior defect in 2015. No reversible ischemia. ??  -on ASA, statin. ??  -troponin neg. X 2. 12 lead EKG: NSR with nospecific t wave changes.  -No active chest pain.  ??  5. Hx of DM: A1C 7.7  ??  6. Hx of HLD: LDL 46.2  -Continue Crestor 20 mg daily  ??  7. Morbid obesity??Body mass index is 53.69 kg/m??.  ??  Pt with bilateral PEs. No SOB with ambulation today or at rest. No chest pain. Echo result reviewed. Mod dilated RV, NL ef.  Will need follow up with Dr. Gasper LloydSabharwal in 1 month.  Dr. Gasper LloydSabharwal to see pt this afternoon.         Future Appointments   Date Time Provider Department Center   06/15/2017  9:00 AM Barbarann EhlersElliott, Charmaine N, [facility]   07/18/2017  8:00 AM E[facility], [facility]   07/18/2017  9:40 AM Mathews RobinsonsSabharwal, Vipal, MD [facility] SCHED   08/28/2017 10:45 AM Barbarann EhlersElliott, Charmaine N, MD [facility] SCHED   ??  ??  ??  Subjective:              Allison MaskerJean C Newman denies chest pain, SOB. States that she was able to ambulate in hallway with PT today without chest pain or SOB.  Has more stamina today.  States [facility]- requests follow up with Dr. Gasper LloydSabharwal.  ??  Objective:                 Physical Exam:                Visit Vitals  BP 135/58 (BP 1 Location: Right arm, BP Patient Position: At rest)   Pulse 69   Temp 97.7 ??F (36.5 ??C)   Resp 16   Ht 5\' 4"  (1.626 m)   Wt 307 lb 5.1 oz (139.4 kg)    LMP  (LMP Unknown)   SpO2 94%   BMI 52.75 kg/m??   ??  ??  ??   General Appearance:   Well developed, alert and oriented x 3, and   individual in no acute distress. Sitting up in chair.   Ears/Nose/Mouth/Throat:    Hearing grossly normal.  ??       Neck:  Supple.   Chest:    Lungs clear to auscultation bilaterally.   Cardiovascular:    Regular rate and rhythm, S1, S2 normal, no murmur.   Abdomen:    Soft, non-tender, bowel sounds are active.   Extremities:  No edema bilaterally.    Skin:  Warm and dry.   ??  Telemetry: normal sinus rhythm  ?? ??   ?? ??   Data Review:               Labs:  Recent??Results         Recent Results (from the past 24 hour(s))   GLUCOSE, POC   ?? Collection Time: 06/10/17 11:23 AM   Result Value Ref Range   ?? Glucose (POC) 168 (H) 65 - 100 mg/dL   ?? Performed by Chad Cordial ??   GLUCOSE, POC   ?? Collection Time: 06/10/17  4:34 PM   Result Value Ref Range   ?? Glucose (POC) 164 (H) 65 - 100 mg/dL   ?? Performed by Randa Evens Antionette ??   GLUCOSE, POC   ?? Collection Time: 06/10/17  8:39 PM   Result Value Ref Range   ?? Glucose (POC) 163 (H) 65 - 100 mg/dL   ?? Performed by Roena Malady ??   CBC WITH AUTOMATED DIFF   ?? Collection Time: 06/11/17  5:11 AM   Result Value Ref Range   ?? WBC 5.8 3.6 - 11.0 K/uL   ?? RBC 4.51 3.80 - 5.20 M/uL   ?? HGB 13.2 11.5 - 16.0 g/dL   ?? HCT 41.5 35.0 - 47.0 %   ?? MCV 92.0 80.0 - 99.0 FL   ?? MCH 29.3 26.0 - 34.0 PG   ?? MCHC 31.8 30.0 - 36.5 g/dL   ?? RDW 13.7 11.5 - 14.5 %   ?? PLATELET 144 (L) 150 - 400 K/uL   ?? MPV 12.1 8.9 - 12.9 FL   ?? NRBC 0.0 0 PER 100 WBC   ?? ABSOLUTE NRBC 0.00 0.00 - 0.01 K/uL   ?? NEUTROPHILS 53 32 - 75 %   ?? LYMPHOCYTES 32 12 - 49 %   ?? MONOCYTES 11 5 - 13 %   ?? EOSINOPHILS 4 0 - 7 %   ?? BASOPHILS 0 0 - 1 %   ?? IMMATURE GRANULOCYTES 0 0.0 - 0.5 %   ?? ABS. NEUTROPHILS 3.0 1.8 - 8.0 K/UL   ?? ABS. LYMPHOCYTES 1.9 0.8 - 3.5 K/UL   ?? ABS. MONOCYTES 0.7 0.0 - 1.0 K/UL   ?? ABS. EOSINOPHILS 0.3 0.0 - 0.4 K/UL   ?? ABS. BASOPHILS 0.0 0.0 - 0.1 K/UL    ?? ABS. IMM. GRANS. 0.0 0.00 - 0.04 K/UL   ?? DF AUTOMATED     METABOLIC PANEL, BASIC   ?? Collection Time: 06/11/17  5:11 AM   Result Value Ref Range   ?? Sodium 134 (L) 136 - 145 mmol/L   ?? Potassium 4.2 3.5 - 5.1 mmol/L   ?? Chloride 103 97 - 108 mmol/L   ?? CO2 24 21 - 32 mmol/L   ?? Anion gap 7 5 - 15 mmol/L   ?? Glucose 155 (H) 65 - 100 mg/dL   ?? BUN 27 (H) 6 - 20 MG/DL   ?? Creatinine 0.97 0.55 - 1.02 MG/DL   ?? BUN/Creatinine ratio 28 (H) 12 - 20     ?? GFR est AA >60 >60 ml/min/1.76m2   ?? GFR est non-AA 56 (L) >60 ml/min/1.30m2   ?? Calcium 9.1 8.5 - 10.1 MG/DL   GLUCOSE, POC   ?? Collection Time: 06/11/17  7:03 AM   Result Value Ref Range   ?? Glucose (POC) 159 (H) 65 - 100 mg/dL   ?? Performed by Roena Malady ??      ??  ??     Radiology:   ??                       Current Facility-Administered Medications  Medication Dose Route Frequency   ??? apixaban (ELIQUIS) tablet 10 mg  10 mg Oral Q12H   ??? [START ON 06/17/2017] apixaban (ELIQUIS) tablet 5 mg  5 mg Oral Q12H   ??? simethicone (MYLICON) tablet 80 mg  80 mg Oral QID PRN   ??? senna-docusate (PERICOLACE) 8.6-50 mg per tablet 1 Tab  1 Tab Oral DAILY   ??? aspirin delayed-release tablet 81 mg  81 mg Oral DAILY   ??? lisinopril (PRINIVIL, ZESTRIL) tablet 10 mg  10 mg Oral DAILY   ??? methIMAzole (TAPAZOLE) tablet 5 mg  5 mg Oral DAILY   ??? metoprolol tartrate (LOPRESSOR) tablet 50 mg  50 mg Oral BID   ??? nystatin (MYCOSTATIN) 100,000 unit/gram powder   Topical QID   ??? oxyCODONE-acetaminophen (PERCOCET 10) 10-325 mg per tablet 1 Tab  1 Tab Oral Q6H PRN   ??? rosuvastatin (CRESTOR) tablet 20 mg  20 mg Oral QHS   ??? triamterene-hydroCHLOROthiazide (MAXZIDE) 37.5-25 mg per tablet 1 Tab  1 Tab Oral DAILY   ??? sodium chloride (NS) flush 5-40 mL  5-40 mL IntraVENous Q8H   ??? sodium chloride (NS) flush 5-40 mL  5-40 mL IntraVENous PRN   ??? ondansetron (ZOFRAN) injection 4 mg  4 mg IntraVENous Q4H PRN   ??? bisacodyl (DULCOLAX) tablet 5 mg  5 mg Oral DAILY PRN    ??? insulin lispro (HUMALOG) injection   SubCUTAneous AC&HS   ??? glucose chewable tablet 16 g  4 Tab Oral PRN   ??? dextrose (D50W) injection syrg 12.5-25 g  12.5-25 g IntraVENous PRN   ??? glucagon (GLUCAGEN) injection 1 mg  1 mg IntraMUSCular PRN   ??? arformoterol (BROVANA) neb solution 15 mcg  15 mcg Nebulization BID RT   ?? And   ??? budesonide (PULMICORT) 500 mcg/2 ml nebulizer suspension  500 mcg Nebulization BID RT   ??? acetaminophen (TYLENOL) tablet 650 mg  650 mg Oral Q4H PRN   ??? albuterol-ipratropium (DUO-NEB) 2.5 MG-0.5 MG/3 ML  3 mL Nebulization Q4H PRN   ??  ??  ??   Rosezella Florida. Serin Thornell, NP     [facility] [place]   [address], [address]   [place], [place] [address]   [phone]          Revision History                        View Details Report

## 2017-06-11 NOTE — Progress Notes (Signed)
Hospital follow-up PCP transitional care appointment has been scheduled with Dr. Otila Kluverharmaine Elliott for Friday, 06/15/17 at 9:00 a.m.  Pending patient discharge.  Elio ForgetGladys Randolph, Care Management Specialist.

## 2017-06-11 NOTE — Progress Notes (Deleted)
Cardiology Progress Note            Admit Date: 06/07/2017  Admit Diagnosis: Acute CHF (congestive heart failure) (HCC) [I50.9]  Date: 06/11/2017     Time: 10:28 AM         Assessment and Plan   ??  1.. Acute bilateral PE  -Eliquis per primary   -Echo with NL ef but RV mod dilated.  Needs follow up in 1 month with repeat echo.  Pt requests to follow up with Dr.Sabharwal.  ??  3 Hx of HTN: BP controlled  -continue lisinopril, triamterene/HCTZ, lopressor 50 mg BID  ??  4. Hx of abnormal stress test:  -Fixed inferior defect in 2015. No reversible ischemia.    -on ASA, statin.    -troponin neg. X 2. 12 lead EKG: NSR with nospecific t wave changes.  -No active chest pain.  ??  5. Hx of DM: A1C 7.7  ??  6. Hx of HLD: LDL 46.2  -Continue Crestor 20 mg daily  ??  7. Morbid obesity Body mass index is 53.69 kg/m??.  ??  Pt with bilateral PEs. No SOB with ambulation today or at rest. No chest pain. Echo result reviewed. Mod dilated RV, NL ef.  Will need follow up with Dr. Gasper Lloyd in 1 month.  Dr. Gasper Lloyd to see pt this afternoon.  Future Appointments   Date Time Provider Department Center   06/15/2017  9:00 AM Barbarann Ehlers, MD [facility] SCHED   07/18/2017  8:00 AM ECHO, [facility]   07/18/2017  9:40 AM Mathews Robinsons, MD [facility] SCHED   08/28/2017 10:45 AM Barbarann Ehlers, MD [facility] SCHED     ??    Subjective:   Allison Newman denies chest pain, SOB. States that she was able to ambulate in hallway with PT today without chest pain or SOB.  Has more stamina today.  States she would like to follow up with [facility] providers- requests follow up with Dr. Gasper Lloyd.    Objective:      Physical Exam:                Visit Vitals  BP 135/58 (BP 1 Location: Right arm, BP Patient Position: At rest)   Pulse 69   Temp 97.7 ??F (36.5 ??C)   Resp 16   Ht 5\' 4"  (1.626 m)   Wt 307 lb 5.1 oz (139.4 kg)   LMP  (LMP Unknown)   SpO2 94%    BMI 52.75 kg/m??          General Appearance:   Well developed, alert and oriented x 3, and   individual in no acute distress. Sitting up in chair.   Ears/Nose/Mouth/Throat:    Hearing grossly normal.         Neck:  Supple.   Chest:    Lungs clear to auscultation bilaterally.   Cardiovascular:    Regular rate and rhythm, S1, S2 normal, no murmur.   Abdomen:    Soft, non-tender, bowel sounds are active.   Extremities:  No edema bilaterally.    Skin:  Warm and dry.     Telemetry: normal sinus rhythm          Data Review:    Labs:    Recent Results (from the past 24 hour(s))   GLUCOSE, POC    Collection Time: 06/10/17 11:23 AM   Result Value Ref Range    Glucose (POC) 168 (H) 65 - 100  mg/dL    Performed by Chad Cordial    GLUCOSE, POC    Collection Time: 06/10/17  4:34 PM   Result Value Ref Range    Glucose (POC) 164 (H) 65 - 100 mg/dL    Performed by Randa Evens Antionette    GLUCOSE, POC    Collection Time: 06/10/17  8:39 PM   Result Value Ref Range    Glucose (POC) 163 (H) 65 - 100 mg/dL    Performed by Roena Malady    CBC WITH AUTOMATED DIFF    Collection Time: 06/11/17  5:11 AM   Result Value Ref Range    WBC 5.8 3.6 - 11.0 K/uL    RBC 4.51 3.80 - 5.20 M/uL    HGB 13.2 11.5 - 16.0 g/dL    HCT 16.1 09.6 - 04.5 %    MCV 92.0 80.0 - 99.0 FL    MCH 29.3 26.0 - 34.0 PG    MCHC 31.8 30.0 - 36.5 g/dL    RDW 40.9 81.1 - 91.4 %    PLATELET 144 (L) 150 - 400 K/uL    MPV 12.1 8.9 - 12.9 FL    NRBC 0.0 0 PER 100 WBC    ABSOLUTE NRBC 0.00 0.00 - 0.01 K/uL    NEUTROPHILS 53 32 - 75 %    LYMPHOCYTES 32 12 - 49 %    MONOCYTES 11 5 - 13 %    EOSINOPHILS 4 0 - 7 %    BASOPHILS 0 0 - 1 %    IMMATURE GRANULOCYTES 0 0.0 - 0.5 %    ABS. NEUTROPHILS 3.0 1.8 - 8.0 K/UL    ABS. LYMPHOCYTES 1.9 0.8 - 3.5 K/UL    ABS. MONOCYTES 0.7 0.0 - 1.0 K/UL    ABS. EOSINOPHILS 0.3 0.0 - 0.4 K/UL    ABS. BASOPHILS 0.0 0.0 - 0.1 K/UL    ABS. IMM. GRANS. 0.0 0.00 - 0.04 K/UL    DF AUTOMATED     METABOLIC PANEL, BASIC    Collection Time: 06/11/17  5:11 AM    Result Value Ref Range    Sodium 134 (L) 136 - 145 mmol/L    Potassium 4.2 3.5 - 5.1 mmol/L    Chloride 103 97 - 108 mmol/L    CO2 24 21 - 32 mmol/L    Anion gap 7 5 - 15 mmol/L    Glucose 155 (H) 65 - 100 mg/dL    BUN 27 (H) 6 - 20 MG/DL    Creatinine 7.82 9.56 - 1.02 MG/DL    BUN/Creatinine ratio 28 (H) 12 - 20      GFR est AA >60 >60 ml/min/1.7m2    GFR est non-AA 56 (L) >60 ml/min/1.39m2    Calcium 9.1 8.5 - 10.1 MG/DL   GLUCOSE, POC    Collection Time: 06/11/17  7:03 AM   Result Value Ref Range    Glucose (POC) 159 (H) 65 - 100 mg/dL    Performed by Roena Malady           Radiology:        Current Facility-Administered Medications   Medication Dose Route Frequency   ??? apixaban (ELIQUIS) tablet 10 mg  10 mg Oral Q12H   ??? [START ON 06/17/2017] apixaban (ELIQUIS) tablet 5 mg  5 mg Oral Q12H   ??? simethicone (MYLICON) tablet 80 mg  80 mg Oral QID PRN   ??? senna-docusate (PERICOLACE) 8.6-50 mg per tablet 1 Tab  1 Tab Oral DAILY   ???  aspirin delayed-release tablet 81 mg  81 mg Oral DAILY   ??? lisinopril (PRINIVIL, ZESTRIL) tablet 10 mg  10 mg Oral DAILY   ??? methIMAzole (TAPAZOLE) tablet 5 mg  5 mg Oral DAILY   ??? metoprolol tartrate (LOPRESSOR) tablet 50 mg  50 mg Oral BID   ??? nystatin (MYCOSTATIN) 100,000 unit/gram powder   Topical QID   ??? oxyCODONE-acetaminophen (PERCOCET 10) 10-325 mg per tablet 1 Tab  1 Tab Oral Q6H PRN   ??? rosuvastatin (CRESTOR) tablet 20 mg  20 mg Oral QHS   ??? triamterene-hydroCHLOROthiazide (MAXZIDE) 37.5-25 mg per tablet 1 Tab  1 Tab Oral DAILY   ??? sodium chloride (NS) flush 5-40 mL  5-40 mL IntraVENous Q8H   ??? sodium chloride (NS) flush 5-40 mL  5-40 mL IntraVENous PRN   ??? ondansetron (ZOFRAN) injection 4 mg  4 mg IntraVENous Q4H PRN   ??? bisacodyl (DULCOLAX) tablet 5 mg  5 mg Oral DAILY PRN   ??? insulin lispro (HUMALOG) injection   SubCUTAneous AC&HS   ??? glucose chewable tablet 16 g  4 Tab Oral PRN   ??? dextrose (D50W) injection syrg 12.5-25 g  12.5-25 g IntraVENous PRN    ??? glucagon (GLUCAGEN) injection 1 mg  1 mg IntraMUSCular PRN   ??? arformoterol (BROVANA) neb solution 15 mcg  15 mcg Nebulization BID RT    And   ??? budesonide (PULMICORT) 500 mcg/2 ml nebulizer suspension  500 mcg Nebulization BID RT   ??? acetaminophen (TYLENOL) tablet 650 mg  650 mg Oral Q4H PRN   ??? albuterol-ipratropium (DUO-NEB) 2.5 MG-0.5 MG/3 ML  3 mL Nebulization Q4H PRN          Rosezella FloridaLisa M. Ahmaya Ostermiller, NP     [facility] [place]   [address], [address]   [place], [place] [id number]   [phone]

## 2017-06-11 NOTE — Progress Notes (Signed)
CM met with patient at bedside to inform of recommendation for home health PT. Patient elected [facility] CM completed referral via extended care. CM awaiting response. Patient anticipates that she will be discharging between 12-1 today; family transporting. CM to monitor.     Felipe Drone, MS

## 2017-06-11 NOTE — Other (Addendum)
Walker order sent to [employer] [facility] [employer]. Awaiting auth. Dan HumphreysWalker will be delivered to room prior to d/c [phone]

## 2017-06-11 NOTE — Progress Notes (Signed)
 Formatting of this note might be different from the original.  CM met with patient at bedside to inform of recommendation for home health PT. Patient elected [facility]. CM completed referral via extended care. CM awaiting response. Patient anticipates that she will be discharging between 12-1 today; family transporting. CM to monitor.     Rosina LOISE Matte, MS  Electronically signed by Matte Rosina LOISE at 06/11/2017  9:47 AM EDT

## 2017-06-11 NOTE — Discharge Summary (Signed)
Discharge Summary by Donato Schultz, MD at 06/11/17 0900                Author: Donato Schultz, MD  Service: Hospitalist  Author Type: Physician       Filed: 06/11/17 1323  Date of Service: 06/11/17 0900  Status: Addendum          Editor: Donato Schultz, MD (Physician)          Related Notes: Original Note by Donato Schultz, MD (Physician) filed at 06/11/17 [phone]                                Inpatient hospitalist discharge summary                  Brief Overview      PATIENT ID: Allison Newman      MRN: [id number]       [place] OF BIRTH: 16-Jan-1941      Admitting Provider: Astrid Drafts, MD      Discharging Provider: Donato Schultz, MD    To contact this individual call [phone] and ask the operator to page.  If unavailable ask to be transferred the Adult Hospitalist Department.         PCP at discharge: Karlyn Agee, [place]    [place] / [place] [address]      Admission date: 06/07/2017  Date of Discharge: 06/11/17      Chief complaint:      Chief Complaint       Patient presents with        ?  Shortness of Breath          Patient Active Problem List        Diagnosis  Code         ?  Essential hypertension  I10     ?  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, adult (HCC)  E66.01, Z68.44     ?  Neuropathic arthritis  M14.60     ?  Low TSH level  R79.89     ?  Acid reflux  K21.9     ?  Hypercholesterolemia  E78.00     ?  Rash in adult  R21     ?  Lung nodule seen on imaging study  R91.1     ?  Thyroid goiter  E04.9     ?  Chronic right hip pain  M25.551, G89.29     ?  Gallbladder polyp  K82.4     ?  Umbilical hernia  D32.2     ?  Microalbuminuria  R80.9     ?  Abnormal finding on EKG  R94.31     ?  Slow transit constipation  K59.01     ?  Chronic pain of left knee  M25.562, G89.29     ?  Osteopenia  M85.80     ?  ACP (advance care planning)  Z71.89     ?  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  Pernicious anemia  D51.0     ?  Morbid obesity ([place])  E66.01     ?  Acute CHF (congestive heart failure) (HCC)  I50.9     ?  Acute pulmonary embolism (  [place])  I26.99     ?  DM2 (diabetes mellitus, type 2) ([place])  E11.9         ?  Hyperthyroidism  E05.90              Primary discharge diagnosis:   Acute hypoxemic respiratory failure sec to Bilateral PE, POA   NO CHF NOTED      Secondary discharge diagnosis:   Morbid obesity   HTN   HLP   NIDDM2   Hyperthyroidism   Lung nodule, unchanged since 2014   Goiter         Discharge Disposition:   Home or Self Care with HHN/PT/OT      Discharge activity:   Ambulate in house      Code status at discharge:   Full Code       Active issues requiring follow up:   Acute PE   Needs age appropriate cancer screening   May need sleep studies      Outpatient follow up:         Future appointments-     Future Appointments           Date  Time  Provider  [place]           08/28/2017  10:45 AM  Karlyn Agee, MD  [place]          Follow-up Information               Follow up With  Specialties  Details  Why  Contact Info              Karlyn Agee, MD  Internal Medicine      [address]   [place]   [place] [address]   [phone]                 Karlyn Agee, MD  Internal Medicine  Schedule an appointment as soon as possible for a visit in 1 week  for follow Up, as recommended. discuss Pap smear, mammogram and other agre appropriate screening  [address]   [address]   [place] [address]   [phone]                   Test results pending upon discharge:   n/a            Details of hospital stay         Presenting problem/ History of present illness:   Acute CHF (congestive heart failure) ([place]) [I50.9]      Allison Newman is a 77 y.o. female  Presented with SOB.      Hospital Course:   Acute hypoxemic respiratory failure sec to Bilateral PE, POA   O2 sats improved  NIV neg for DVT but  limited studies technically.   No obvious signs/symptoms of Pneumonia, no CHF-lasix stopped   started on Eliquis 10 mg PO BID till 06/16/17, and then 21m po BID from 06/17/17- patient advised to get age appropriate cancer screening   PT/OT  Patient needs outpatient sleep studies   ??   Thrombocytopenia   No bleeding   stable   ??   There was no CHF noted   ??   NIDDM2 with hyperglycemia   HbA1c is 7.7   Diabetic diet   accu-checks and SSI TID  ??   HTN   Cont home meds   ??  Hyperthyroidism   TSH stable   Cont tapazole   ??   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   Goiter   Unchanged compared to CT done on 06/25/12   ??   Lung Nodule   Right apical   Unchanged compared to CT since 2014   Needs follow up      On the date of discharge, diagnostic face to face encounter was performed.   Patient was hemodynamically stable, offering no new complaints. Denies any shortness of breath at rest, no fevers or chills, no diarrhea or constipation. Patient is agreeable for discharge. Patient understood and verbalized the understanding of the discharge  plan.      Patient was advised to seek medical help/ care or return to ED, if symptoms recur, worsen or new symptoms develop.         Operative procedures performed:         Treatments: IV hydration and Lovenox, lasix and Eliquis      Consults:  IP CONSULT TO CARDIOLOGY      Procedures: none          Diet:  DIET DIABETIC CONSISTENT CARB      Pertinent test results:   Xr Chest Pa Lat      Result Date: 06/07/2017   CLINICAL HISTORY: Hypoxia INDICATION: Hypoxia COMPARISON: 10/27/2014 FINDINGS: PA and lateral views of the chest are obtained. The cardiopericardial silhouette is prominent. Diminished lung volumes. Pulmonary artery enlargement is suggested.. There is  no pleural effusion, pneumothorax or focal consolidation present.       IMPRESSION: Mild cardiomegaly. Diminished lung volumes with  pulmonary artery enlargement suggested.      Cta Chest W Or W Wo Cont      Result Date: 06/08/2017   EXAM:  CTA CHEST W OR W WO CONT INDICATION:  shortness of breath, PE, COMPARISON: PROCEDURE: During rapid intravenous bolus infusion 97 ml Isovue 370 thin spiral axial images were obtained through the chest. Coronal MIP reconstructions of the pulmonary  arteries as well as sagittal and coronal standard reconstruction were obtained for evaluation of the pulmonary arteries. Standard axial soft issue and lung windows.  CT dose reduction was achieved through use of a standardized protocol tailored for this  examination and automatic exposure control for dose modulation. FINDINGS: Multiple intraluminal filling defects in pulmonary arteries consistent with pulmonary emboli. There is no hilar, mediastinal or axillary adenopathy. 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 Minimal atelectasis left base. In the right upper lobe apical region is an approximately 5 mm nodule which is unchanged in size when compared on coronal images with  the prior chest CT 06/25/12. No other parenchymal nodules, lesions, infiltrates demonstrated. Imaging through the upper abdomen is unremarkable. Scoliosis and mild chronic degenerative changes in the thoracic spine again noted.       IMPRESSION: 1. Multiple pulmonary emboli to both lungs. 2. Again demonstrated is upper thoracic goiter with mild displacement and compression of the trachea similar to 06/25/12. 3. No change in approximately 5 mm right apical pulmonary nodule since 2014.    789 The findings were called to hospitalist by me at 11:30 AM           Recent Results (from the past 336 hour(s))     EKG, 12 LEAD, INITIAL  Collection Time: 06/07/17  7:49 PM         Result  Value  Ref Range            Ventricular Rate  79  BPM       Atrial Rate  79  BPM       P-R Interval  170  ms       QRS Duration  86  ms        Q-T Interval  372  ms       QTC Calculation ([place])  426  ms       Calculated P Axis  52  degrees       Calculated R Axis  25  degrees       Calculated T Axis  -38  degrees       Diagnosis                 Normal sinus rhythm   T wave abnormality, consider inferior ischemia   T wave abnormality, consider anterolateral ischemia   When compared with ECG of 11-Nov-2013 13:17,   Inverted T waves have replaced nonspecific T wave abnormality in    Anterolateral leads   Confirmed by Meda Coffee, M.D., Juanda Crumble [id number]) on 06/08/2017 8:20:39 AM          SAMPLES BEING HELD          Collection Time: 06/07/17  7:54 PM         Result  Value  Ref Range            SAMPLES BEING HELD  1PST 1BLU 1RED 1LAV         COMMENT                  Add-on orders for these samples will be processed based on acceptable specimen integrity and analyte stability, which may vary by analyte.       CBC WITH AUTOMATED DIFF          Collection Time: 06/07/17  7:54 PM         Result  Value  Ref Range            WBC  7.3  3.6 - 11.0 K/uL       RBC  4.54  3.80 - 5.20 M/uL       HGB  13.1  11.5 - 16.0 g/dL       HCT  42.2  35.0 - 47.0 %       MCV  93.0  80.0 - 99.0 FL       MCH  28.9  26.0 - 34.0 PG       MCHC  31.0  30.0 - 36.5 g/dL       RDW  14.1  11.5 - 14.5 %       PLATELET  137 (L)  150 - 400 K/uL       MPV  12.6  8.9 - 12.9 FL       NRBC  0.0  0 PER 100 WBC       ABSOLUTE NRBC  0.00  0.00 - 0.01 K/uL       NEUTROPHILS  64  32 - 75 %       LYMPHOCYTES  23  12 - 49 %       MONOCYTES  9  5 - 13 %       EOSINOPHILS  3  0 - 7 %  BASOPHILS  1  0 - 1 %       IMMATURE GRANULOCYTES  0  0.0 - 0.5 %       ABS. NEUTROPHILS  4.7  1.8 - 8.0 K/UL       ABS. LYMPHOCYTES  1.7  0.8 - 3.5 K/UL       ABS. MONOCYTES  0.7  0.0 - 1.0 K/UL       ABS. EOSINOPHILS  0.2  0.0 - 0.4 K/UL       ABS. BASOPHILS  0.0  0.0 - 0.1 K/UL       ABS. IMM. GRANS.  0.0  0.00 - 0.04 K/UL       DF  AUTOMATED          METABOLIC PANEL, BASIC          Collection Time: 06/07/17  7:54 PM          Result  Value  Ref Range            Sodium  142  136 - 145 mmol/L       Potassium  4.0  3.5 - 5.1 mmol/L       Chloride  109 (H)  97 - 108 mmol/L       CO2  26  21 - 32 mmol/L       Anion gap  7  5 - 15 mmol/L       Glucose  161 (H)  65 - 100 mg/dL       BUN  24 (H)  6 - 20 MG/DL       Creatinine  0.97  0.55 - 1.02 MG/DL       BUN/Creatinine ratio  25 (H)  12 - 20         GFR est AA  >60  >60 ml/min/1.4m       GFR est non-AA  56 (L)  >60 ml/min/1.727m      Calcium  9.4  8.5 - 10.1 MG/DL       NT-PRO BNP          Collection Time: 06/07/17  7:54 PM         Result  Value  Ref Range            NT pro-BNP  1,164 (H)  <125 PG/ML       TROPONIN I          Collection Time: 06/07/17  7:54 PM         Result  Value  Ref Range            Troponin-I, Qt.  <0.05  <0.05 ng/mL       TSH 3RD GENERATION          Collection Time: 06/08/17  3:50 AM         Result  Value  Ref Range            TSH  1.33  0.36 - 3.74 uIU/mL       METABOLIC PANEL, COMPREHENSIVE          Collection Time: 06/08/17  4:00 AM         Result  Value  Ref Range            Sodium  139  136 - 145 mmol/L       Potassium  3.7  3.5 - 5.1 mmol/L       Chloride  107  97 - 108 mmol/L  CO2  24  21 - 32 mmol/L       Anion gap  8  5 - 15 mmol/L       Glucose  219 (H)  65 - 100 mg/dL       BUN  22 (H)  6 - 20 MG/DL       Creatinine  0.92  0.55 - 1.02 MG/DL       BUN/Creatinine ratio  24 (H)  12 - 20         GFR est AA  >60  >60 ml/min/1.60m       GFR est non-AA  60 (L)  >60 ml/min/1.740m      Calcium  9.1  8.5 - 10.1 MG/DL       Bilirubin, total  0.6  0.2 - 1.0 MG/DL       ALT (SGPT)  13  12 - 78 U/L       AST (SGOT)  14 (L)  15 - 37 U/L       Alk. phosphatase  63  45 - 117 U/L       Protein, total  7.1  6.4 - 8.2 g/dL       Albumin  3.0 (L)  3.5 - 5.0 g/dL       Globulin  4.1 (H)  2.0 - 4.0 g/dL       A-G Ratio  0.7 (L)  1.1 - 2.2         LIPID PANEL          Collection Time: 06/08/17  4:00 AM         Result  Value  Ref Range            LIPID PROFILE                Cholesterol, total  116  <200 MG/DL       Triglyceride  99  <150 MG/DL       HDL Cholesterol  50  MG/DL       LDL, calculated  46.2  0 - 100 MG/DL       VLDL, calculated  19.8  MG/DL       CHOL/HDL Ratio  2.3  0.0 - 5.0         CBC WITH AUTOMATED DIFF          Collection Time: 06/08/17  4:00 AM         Result  Value  Ref Range            WBC  6.6  3.6 - 11.0 K/uL       RBC  4.44  3.80 - 5.20 M/uL       HGB  12.7  11.5 - 16.0 g/dL       HCT  41.0  35.0 - 47.0 %       MCV  92.3  80.0 - 99.0 FL       MCH  28.6  26.0 - 34.0 PG       MCHC  31.0  30.0 - 36.5 g/dL       RDW  14.0  11.5 - 14.5 %       PLATELET  128 (L)  150 - 400 K/uL       MPV  12.1  8.9 - 12.9 FL       NRBC  0.0  0 PER 100 WBC       ABSOLUTE NRBC  0.00  0.00 -  0.01 K/uL       NEUTROPHILS  59  32 - 75 %       LYMPHOCYTES  27  12 - 49 %       MONOCYTES  9  5 - 13 %       EOSINOPHILS  4  0 - 7 %       BASOPHILS  1  0 - 1 %       IMMATURE GRANULOCYTES  0  0.0 - 0.5 %       ABS. NEUTROPHILS  3.9  1.8 - 8.0 K/UL       ABS. LYMPHOCYTES  1.8  0.8 - 3.5 K/UL       ABS. MONOCYTES  0.6  0.0 - 1.0 K/UL       ABS. EOSINOPHILS  0.2  0.0 - 0.4 K/UL       ABS. BASOPHILS  0.0  0.0 - 0.1 K/UL       ABS. IMM. GRANS.  0.0  0.00 - 0.04 K/UL       DF  AUTOMATED          MAGNESIUM          Collection Time: 06/08/17  4:00 AM         Result  Value  Ref Range            Magnesium  1.9  1.6 - 2.4 mg/dL       PHOSPHORUS          Collection Time: 06/08/17  4:00 AM         Result  Value  Ref Range            Phosphorus  3.8  2.6 - 4.7 MG/DL       TROPONIN I          Collection Time: 06/08/17  4:00 AM         Result  Value  Ref Range            Troponin-I, Qt.  <0.05  <0.05 ng/mL       CK W/ CKMB & INDEX          Collection Time: 06/08/17  4:00 AM         Result  Value  Ref Range            CK  115  26 - 192 U/L       CK - MB  2.1  <3.6 NG/ML       CK-MB Index  1.8  0.0 - 2.5         HEMOGLOBIN A1C WITH EAG          Collection Time: 06/08/17  4:05 AM         Result  Value  Ref Range             Hemoglobin A1c  7.7 (H)  4.2 - 6.3 %       Est. average glucose  174  mg/dL       GLUCOSE, POC          Collection Time: 06/08/17  6:23 AM         Result  Value  Ref Range            Glucose (POC)  181 (H)  65 - 100 mg/dL       Performed by  [place], POC  Collection Time: 06/08/17 11:46 AM         Result  Value  Ref Range            Glucose (POC)  179 (H)  65 - 100 mg/dL       Performed by  PROTHEROE [place]         ECHO ADULT COMPLETE          Collection Time: 06/08/17  4:44 PM         Result  Value  Ref Range            Tapse  2.11 (A)  1.5 - 2.0 cm       Ao Root D  3.33  cm       PASP  45.0  mmHg       GLUCOSE, POC          Collection Time: 06/08/17  4:54 PM         Result  Value  Ref Range            Glucose (POC)  169 (H)  65 - 100 mg/dL       Performed by  [place], POC          Collection Time: 06/08/17  9:13 PM         Result  Value  Ref Range            Glucose (POC)  219 (H)  65 - 100 mg/dL       Performed by  Dolores Frame         CBC WITH AUTOMATED DIFF          Collection Time: 06/09/17  3:46 AM         Result  Value  Ref Range            WBC  6.8  3.6 - 11.0 K/uL       RBC  4.52  3.80 - 5.20 M/uL       HGB  12.9  11.5 - 16.0 g/dL       HCT  41.9  35.0 - 47.0 %       MCV  92.7  80.0 - 99.0 FL       MCH  28.5  26.0 - 34.0 PG       MCHC  30.8  30.0 - 36.5 g/dL       RDW  14.1  11.5 - 14.5 %       PLATELET  130 (L)  150 - 400 K/uL       MPV  12.5  8.9 - 12.9 FL       NRBC  0.0  0 PER 100 WBC       ABSOLUTE NRBC  0.00  0.00 - 0.01 K/uL       NEUTROPHILS  53  32 - 75 %       LYMPHOCYTES  32  12 - 49 %       MONOCYTES  11  5 - 13 %       EOSINOPHILS  3  0 - 7 %       BASOPHILS  1  0 - 1 %       IMMATURE GRANULOCYTES  0  0.0 - 0.5 %       ABS. NEUTROPHILS  3.7  1.8 - 8.0 K/UL       ABS. LYMPHOCYTES  2.2  0.8 - 3.5 K/UL       ABS. MONOCYTES  0.7  0.0 - 1.0 K/UL       ABS. EOSINOPHILS  0.2  0.0 - 0.4 K/UL       ABS. BASOPHILS  0.0  0.0 - 0.1  K/UL       ABS. IMM. GRANS.  0.0  0.00 - 0.04 K/UL       DF  AUTOMATED          METABOLIC PANEL, BASIC          Collection Time: 06/09/17  3:46 AM         Result  Value  Ref Range            Sodium  135 (L)  136 - 145 mmol/L       Potassium  3.8  3.5 - 5.1 mmol/L       Chloride  103  97 - 108 mmol/L       CO2  27  21 - 32 mmol/L       Anion gap  5  5 - 15 mmol/L       Glucose  178 (H)  65 - 100 mg/dL       BUN  26 (H)  6 - 20 MG/DL       Creatinine  0.95  0.55 - 1.02 MG/DL       BUN/Creatinine ratio  27 (H)  12 - 20         GFR est AA  >60  >60 ml/min/1.17m       GFR est non-AA  58 (L)  >60 ml/min/1.771m      Calcium  8.7  8.5 - 10.1 MG/DL       T4, FREE          Collection Time: 06/09/17  3:46 AM         Result  Value  Ref Range            T4, Free  1.2  0.8 - 1.5 NG/DL       GLUCOSE, POC          Collection Time: 06/09/17  6:34 AM         Result  Value  Ref Range            Glucose (POC)  182 (H)  65 - 100 mg/dL       Performed by  Richardson InNiger PCT         GLUCOSE, POC          Collection Time: 06/09/17 12:18 PM         Result  Value  Ref Range            Glucose (POC)  189 (H)  65 - 100 mg/dL       Performed by  ShRowanPOC          Collection Time: 06/09/17  4:44 PM         Result  Value  Ref Range            Glucose (POC)  162 (H)  65 - 100 mg/dL       Performed by  Unkown Operator         GLUCOSE, POC          Collection Time: 06/09/17  9:07 PM  Result  Value  Ref Range            Glucose (POC)  190 (H)  65 - 100 mg/dL       Performed by  Fayrene Fearing         CBC WITH AUTOMATED DIFF          Collection Time: 06/10/17  3:47 AM         Result  Value  Ref Range            WBC  6.2  3.6 - 11.0 K/uL       RBC  4.32  3.80 - 5.20 M/uL       HGB  12.7  11.5 - 16.0 g/dL       HCT  39.6  35.0 - 47.0 %       MCV  91.7  80.0 - 99.0 FL       MCH  29.4  26.0 - 34.0 PG       MCHC  32.1  30.0 - 36.5 g/dL       RDW  13.6  11.5 - 14.5 %       PLATELET  132 (L)  150 - 400 K/uL        MPV  12.6  8.9 - 12.9 FL       NRBC  0.0  0 PER 100 WBC       ABSOLUTE NRBC  0.00  0.00 - 0.01 K/uL       NEUTROPHILS  55  32 - 75 %       LYMPHOCYTES  30  12 - 49 %       MONOCYTES  10  5 - 13 %       EOSINOPHILS  4  0 - 7 %       BASOPHILS  1  0 - 1 %       IMMATURE GRANULOCYTES  0  0.0 - 0.5 %       ABS. NEUTROPHILS  3.4  1.8 - 8.0 K/UL       ABS. LYMPHOCYTES  1.9  0.8 - 3.5 K/UL       ABS. MONOCYTES  0.6  0.0 - 1.0 K/UL       ABS. EOSINOPHILS  0.3  0.0 - 0.4 K/UL       ABS. BASOPHILS  0.0  0.0 - 0.1 K/UL       ABS. IMM. GRANS.  0.0  0.00 - 0.04 K/UL       DF  AUTOMATED          METABOLIC PANEL, BASIC          Collection Time: 06/10/17  3:47 AM         Result  Value  Ref Range            Sodium  135 (L)  136 - 145 mmol/L       Potassium  3.7  3.5 - 5.1 mmol/L       Chloride  103  97 - 108 mmol/L       CO2  26  21 - 32 mmol/L       Anion gap  6  5 - 15 mmol/L       Glucose  188 (H)  65 - 100 mg/dL       BUN  23 (H)  6 - 20 MG/DL       Creatinine  0.81  0.55 -  1.02 MG/DL       BUN/Creatinine ratio  28 (H)  12 - 20         GFR est AA  >60  >60 ml/min/1.13m       GFR est non-AA  >60  >60 ml/min/1.767m      Calcium  8.7  8.5 - 10.1 MG/DL       GLUCOSE, POC          Collection Time: 06/10/17  6:35 AM         Result  Value  Ref Range            Glucose (POC)  174 (H)  65 - 100 mg/dL       Performed by  WiFayrene Fearing       GLUCOSE, POC          Collection Time: 06/10/17 11:23 AM         Result  Value  Ref Range            Glucose (POC)  168 (H)  65 - 100 mg/dL       Performed by  HeLorne Skeens       GLUCOSE, POC          Collection Time: 06/10/17  4:34 PM         Result  Value  Ref Range            Glucose (POC)  164 (H)  65 - 100 mg/dL       Performed by  EdOletta Lamasntionette         GLUCOSE, POC          Collection Time: 06/10/17  8:39 PM         Result  Value  Ref Range            Glucose (POC)  163 (H)  65 - 100 mg/dL       Performed by  AlGlade Lloyd       CBC WITH AUTOMATED DIFF          Collection  Time: 06/11/17  5:11 AM         Result  Value  Ref Range            WBC  5.8  3.6 - 11.0 K/uL       RBC  4.51  3.80 - 5.20 M/uL       HGB  13.2  11.5 - 16.0 g/dL       HCT  41.5  35.0 - 47.0 %       MCV  92.0  80.0 - 99.0 FL       MCH  29.3  26.0 - 34.0 PG       MCHC  31.8  30.0 - 36.5 g/dL       RDW  13.7  11.5 - 14.5 %       PLATELET  144 (L)  150 - 400 K/uL       MPV  12.1  8.9 - 12.9 FL       NRBC  0.0  0 PER 100 WBC       ABSOLUTE NRBC  0.00  0.00 - 0.01 K/uL       NEUTROPHILS  53  32 - 75 %       LYMPHOCYTES  32  12 - 49 %       MONOCYTES  11  5 -  13 %       EOSINOPHILS  4  0 - 7 %       BASOPHILS  0  0 - 1 %       IMMATURE GRANULOCYTES  0  0.0 - 0.5 %       ABS. NEUTROPHILS  3.0  1.8 - 8.0 K/UL       ABS. LYMPHOCYTES  1.9  0.8 - 3.5 K/UL       ABS. MONOCYTES  0.7  0.0 - 1.0 K/UL       ABS. EOSINOPHILS  0.3  0.0 - 0.4 K/UL       ABS. BASOPHILS  0.0  0.0 - 0.1 K/UL       ABS. IMM. GRANS.  0.0  0.00 - 0.04 K/UL       DF  AUTOMATED          METABOLIC PANEL, BASIC          Collection Time: 06/11/17  5:11 AM         Result  Value  Ref Range            Sodium  134 (L)  136 - 145 mmol/L       Potassium  4.2  3.5 - 5.1 mmol/L       Chloride  103  97 - 108 mmol/L       CO2  24  21 - 32 mmol/L       Anion gap  7  5 - 15 mmol/L       Glucose  155 (H)  65 - 100 mg/dL       BUN  27 (H)  6 - 20 MG/DL       Creatinine  0.97  0.55 - 1.02 MG/DL       BUN/Creatinine ratio  28 (H)  12 - 20         GFR est AA  >60  >60 ml/min/1.92m       GFR est non-AA  56 (L)  >60 ml/min/1.766m      Calcium  9.1  8.5 - 10.1 MG/DL       GLUCOSE, POC          Collection Time: 06/11/17  7:03 AM         Result  Value  Ref Range            Glucose (POC)  159 (H)  65 - 100 mg/dL            Performed by  AlGlade Lloyd                 Physical Exam on Discharge:      Discharge condition: fair      Vital signs:    Patient Vitals for the past 12 hrs:            Temp  Pulse  Resp  BP  SpO2            06/11/17 0803  --  --  --  --  94 %             06/11/17 0718  97.7 ??F (36.5 ??C)  69  16  135/58  95 %     06/11/17 0511  97.3 ??F (36.3 ??C)  69  20  155/75  97 %     06/10/17 2340  --  73  --  109/83  --  06/10/17 2328  98.2 ??F (36.8 ??C)  73  18  96/48  90 %           General appearance: alert, cooperative, no distress, appears stated age   Lungs: clear to auscultation bilaterally   Heart: regular rate and rhythm, S1, S2 normal, no murmur, click, rub or gallop        Current Discharge Medication List              START taking these medications          Details        apixaban (ELIQUIS) 5 mg tablet  Take 10 mg (2 tabs) Orally twice a day till 06/16/17, and then start 5 mg (1 one tab) twice a day from 06/17/17   Qty: 72 Tab, Refills:  0                     CONTINUE these medications which have NOT CHANGED          Details        lisinopril (PRINIVIL, ZESTRIL) 10 mg tablet  Take 1 Tab by mouth daily.   Qty: 90 Tab, Refills:  1          Comments: **Patient requests 90 days supply**   Associated Diagnoses: HTN, goal below 140/90               metFORMIN (GLUCOPHAGE) 1,000 mg tablet  Take 1 Tab by mouth two (2) times daily (with meals).   Qty: 180 Tab, Refills:  1               methIMAzole (TAPAZOLE) 5 mg tablet  Take 1 Tab by mouth daily.   Qty: 90 Tab, Refills:  1               metoprolol tartrate (LOPRESSOR) 50 mg tablet  Take 1 Tab by mouth two (2) times a day.   Qty: 180 Tab, Refills:  1          Comments: **Patient requests 90 days supply**   Associated Diagnoses: HTN, goal below 140/90               rosuvastatin (CRESTOR) 20 mg tablet  Take 1 Tab by mouth nightly.   Qty: 90 Tab, Refills:  1          Associated Diagnoses: Diabetes mellitus without complication ([place]); Hypercholesterolemia; Microalbuminuria;  Elevated BUN               triamterene-hydroCHLOROthiazide (DYAZIDE) 37.5-25 mg per capsule  TAKE 1 CAPSULE BY MOUTH EVERY DAY   Qty: 90 Cap, Refills:  1          Associated Diagnoses: HTN, goal below 140/90               BREO ELLIPTA 200-25 mcg/dose  inhaler  INHALE 1 PUFF BY MOUTH DAILY   Qty: 1 Each, Refills:  5          Associated Diagnoses: COPD (chronic obstructive pulmonary disease) with chronic bronchitis ([place])               !! lancets misc  Use BID. Dx: E11.9   Qty: 100 Each, Refills:  11          Associated Diagnoses: Uncontrolled type 2 diabetes mellitus with hyperglycemia (HCC)               CALCIUM 600 + D tablet  take 1 tablet by mouth twice  a day   Qty: 60 Tab, Refills:  11               JANUMET 50-500 mg per tablet  take 1 tablet by mouth once daily   Qty: 30 Tab, Refills:  1          Associated Diagnoses: Diabetes mellitus without complication ([place]); Hypercholesterolemia; Microalbuminuria;  Elevated BUN               aspirin delayed-release 81 mg tablet  Take 1 Tab by mouth daily.   Qty: 30 Tab, Refills:  11          Associated Diagnoses: On aspirin at home               oxyCODONE-acetaminophen (PERCOCET 10) 10-325 mg per tablet  take 1/2 TO 1 tablet once daily if needed for pain   Refills: 0               glucose blood VI test strips (BLOOD GLUCOSE TEST) strip  Use BID DxE11.9   Qty: 100 Strip, Refills:  11          Associated Diagnoses: Uncontrolled type 2 diabetes mellitus with hyperglycemia (HCC)               nystatin (MYCOSTATIN) powder  Apply  to affected area four (4) times daily.   Qty: 120 g, Refills:  3          Associated Diagnoses: Skin candidiasis               aspirin-calcium carbonate 81 mg-300 mg calcium(777 mg) tab  Take 81 mg by mouth.               !! FREESTYLE LANCETS 28 gauge misc  Refills: 1               cyanocobalamin 1,000 mcg tablet  Take 1,000 mcg by mouth daily.               !! - Potential duplicate medications found. Please discuss with provider.                     Total time spent on discharge planning, counseling and co-ordination of care:    33 minutes      Donato Schultz, MD   06/11/17   9:00 AM            NOTIFY YOUR PHYSICIAN FOR ANY OF THE FOLLOWING:     Fever over 101 degrees for 24 hours.    Chest pain,  shortness of breath, fever, chills, nausea, vomiting, diarrhea, change in mentation, falling, weakness, bleeding. Severe pain or pain not relieved by medications.   Or, any other signs or symptoms that you may have questions about.

## 2017-06-11 NOTE — Progress Notes (Signed)
 Formatting of this note might be different from the original.    Electronically signed by Hanna Olam HERO., NP at 06/25/2017  5:25 PM EDT

## 2017-06-11 NOTE — Progress Notes (Signed)
 Formatting of this note might be different from the original.  Hospital follow-up PCP transitional care appointment has been scheduled with Dr. Charmaine Elliott for Friday, 06/15/17 at 9:00 a.m.  Pending patient discharge.  Kenneth Scarce, Care Management Specialist.  Electronically signed by Scarce Kenneth HERO at 06/11/2017 10:15 AM EDT

## 2017-06-11 NOTE — Progress Notes (Signed)
 Formatting of this note is different from the original.  Images from the original note were not included.                                                                         Cardiology Progress Note           Admit Date: 06/07/2017  Admit Diagnosis: Acute CHF (congestive heart failure) (HCC) [I50.9]  Date: 06/11/2017     Time: 10:28 AM         Assessment and Plan     1.. Acute bilateral PE  -Eliquis per primary   -Echo with NL ef but RV mod dilated.  Needs follow up in 1 month with repeat echo.  Pt requests to follow up with Dr.Sabharwal.    3Hx of HTN: BP controlled  -continue lisinopril, triamterene /HCTZ, lopressor  50 mg BID    4. Hx of abnormal stress test:  -Fixed inferior defect in 2015. No reversible ischemia.   -on ASA, statin.   -troponin neg. X 2. 12 lead EKG: NSR with nospecific t wave changes.  -No active chest pain.    5. Hx of DM: A1C 7.7    6. Hx of HLD: LDL 46.2  -Continue Crestor  20 mg daily    7. Morbid obesityBody mass index is 53.69 kg/m.    Pt with bilateral PEs. No SOB with ambulation today or at rest. No chest pain. Echo result reviewed. Mod dilated RV, NL ef.  Will need follow up with Dr. Gardenia in 1 month.  Dr. Gardenia to see pt this afternoon.    Future Appointments   Date Time Provider Department Center   06/15/2017  9:00 AM Viktoria Arty SAILOR, MD [facility] SCHED   07/18/2017  8:00 AM ECHO, [facility] SCHED   07/18/2017  9:40 AM Gardenia Marina, MD [facility] SCHED   08/28/2017 10:45 AM Viktoria Arty SAILOR, MD [facility] SCHED         Subjective:              Allison Newman denies chest pain, SOB. States that she was able to ambulate in hallway with PT today without chest pain or SOB.  Has more stamina today.  States she would like to follow up with [facility] providers- requests follow up with Dr. Gardenia.    Objective:                Physical Exam:    Visit Vitals  BP 135/58 (BP 1 Location: Right arm, BP Patient Position: At rest)   Pulse 69   Temp  97.7 F (36.5 C)   Resp 16   Ht 5' 4 (1.626 m)   Wt 307 lb 5.1 oz (139.4 kg)   LMP  (LMP Unknown)   SpO2 94%   BMI 52.75 kg/m          General Appearance:   Well developed, alert and oriented x 3, and   individual in no acute distress. Sitting up in chair.   Ears/Nose/Mouth/Throat:    Hearing grossly normal.         Neck:  Supple.   Chest:    Lungs clear to auscultation bilaterally.   Cardiovascular:    Regular  rate and rhythm, S1, S2 normal, no murmur.   Abdomen:    Soft, non-tender, bowel sounds are active.   Extremities:  No edema bilaterally.    Skin:  Warm and dry.     Telemetry: normal sinus rhythm          Data Review:               Labs:    RecentResults     Recent Results (from the past 24 hour(s))   GLUCOSE, POC    Collection Time: 06/10/17 11:23 AM   Result Value Ref Range    Glucose (POC) 168 (H) 65 - 100 mg/dL    Performed by Austin Stabile    GLUCOSE, POC    Collection Time: 06/10/17  4:34 PM   Result Value Ref Range    Glucose (POC) 164 (H) 65 - 100 mg/dL    Performed by Celestia Antionette    GLUCOSE, POC    Collection Time: 06/10/17  8:39 PM   Result Value Ref Range    Glucose (POC) 163 (H) 65 - 100 mg/dL    Performed by Dasie Angry    CBC WITH AUTOMATED DIFF    Collection Time: 06/11/17  5:11 AM   Result Value Ref Range    WBC 5.8 3.6 - 11.0 K/uL    RBC 4.51 3.80 - 5.20 M/uL    HGB 13.2 11.5 - 16.0 g/dL    HCT 58.4 64.9 - 52.9 %    MCV 92.0 80.0 - 99.0 FL    MCH 29.3 26.0 - 34.0 PG    MCHC 31.8 30.0 - 36.5 g/dL    RDW 86.2 88.4 - 85.4 %    PLATELET 144 (L) 150 - 400 K/uL    MPV 12.1 8.9 - 12.9 FL    NRBC 0.0 0 PER 100 WBC    ABSOLUTE NRBC 0.00 0.00 - 0.01 K/uL    NEUTROPHILS 53 32 - 75 %    LYMPHOCYTES 32 12 - 49 %    MONOCYTES 11 5 - 13 %    EOSINOPHILS 4 0 - 7 %    BASOPHILS 0 0 - 1 %    IMMATURE GRANULOCYTES 0 0.0 - 0.5 %    ABS. NEUTROPHILS 3.0 1.8 - 8.0 K/UL    ABS. LYMPHOCYTES 1.9 0.8 - 3.5 K/UL    ABS. MONOCYTES 0.7 0.0 - 1.0 K/UL    ABS.  EOSINOPHILS 0.3 0.0 - 0.4 K/UL    ABS. BASOPHILS 0.0 0.0 - 0.1 K/UL    ABS. IMM. GRANS. 0.0 0.00 - 0.04 K/UL    DF AUTOMATED     METABOLIC PANEL, BASIC    Collection Time: 06/11/17  5:11 AM   Result Value Ref Range    Sodium 134 (L) 136 - 145 mmol/L    Potassium 4.2 3.5 - 5.1 mmol/L    Chloride 103 97 - 108 mmol/L    CO2 24 21 - 32 mmol/L    Anion gap 7 5 - 15 mmol/L    Glucose 155 (H) 65 - 100 mg/dL    BUN 27 (H) 6 - 20 MG/DL    Creatinine 9.02 9.44 - 1.02 MG/DL    BUN/Creatinine ratio 28 (H) 12 - 20      GFR est AA >60 >60 ml/min/1.107m2    GFR est non-AA 56 (L) >60 ml/min/1.75m2    Calcium  9.1 8.5 - 10.1 MG/DL   GLUCOSE, POC    Collection Time: 06/11/17  7:03  AM   Result Value Ref Range    Glucose (POC) 159 (H) 65 - 100 mg/dL    Performed by Dasie Angry             Radiology:         Current Facility-Administered Medications   Medication Dose Route Frequency   ? apixaban (ELIQUIS) tablet 10 mg  10 mg Oral Q12H   ? [START ON 06/17/2017] apixaban (ELIQUIS) tablet 5 mg  5 mg Oral Q12H   ? simethicone (MYLICON) tablet 80 mg  80 mg Oral QID PRN   ? senna-docusate (PERICOLACE) 8.6-50 mg per tablet 1 Tab  1 Tab Oral DAILY   ? aspirin  delayed-release tablet 81 mg  81 mg Oral DAILY   ? lisinopril (PRINIVIL, ZESTRIL) tablet 10 mg  10 mg Oral DAILY   ? methIMAzole  (TAPAZOLE ) tablet 5 mg  5 mg Oral DAILY   ? metoprolol  tartrate (LOPRESSOR ) tablet 50 mg  50 mg Oral BID   ? nystatin (MYCOSTATIN) 100,000 unit/gram powder   Topical QID   ? oxyCODONE-acetaminophen  (PERCOCET 10) 10-325 mg per tablet 1 Tab  1 Tab Oral Q6H PRN   ? rosuvastatin  (CRESTOR ) tablet 20 mg  20 mg Oral QHS   ? triamterene -hydroCHLOROthiazide  (MAXZIDE ) 37.5-25 mg per tablet 1 Tab  1 Tab Oral DAILY   ? sodium chloride  (NS) flush 5-40 mL  5-40 mL IntraVENous Q8H   ? sodium chloride  (NS) flush 5-40 mL  5-40 mL IntraVENous PRN   ? ondansetron  (ZOFRAN ) injection 4 mg  4 mg IntraVENous Q4H PRN   ? bisacodyl (DULCOLAX) tablet 5 mg  5 mg  Oral DAILY PRN   ? insulin lispro (HUMALOG) injection   SubCUTAneous AC&HS   ? glucose chewable tablet 16 g  4 Tab Oral PRN   ? dextrose (D50W) injection syrg 12.5-25 g  12.5-25 g IntraVENous PRN   ? glucagon (GLUCAGEN) injection 1 mg  1 mg IntraMUSCular PRN   ? arformoterol  (BROVANA ) neb solution 15 mcg  15 mcg Nebulization BID RT    And   ? budesonide  (PULMICORT ) 500 mcg/2 ml nebulizer suspension  500 mcg Nebulization BID RT   ? acetaminophen  (TYLENOL ) tablet 650 mg  650 mg Oral Q4H PRN   ? albuterol-ipratropium (DUO-NEB) 2.5 MG-0.5 MG/3 ML  3 mL Nebulization Q4H PRN          Olam HERO. Zimmer, NP     [facility]    [address]   [place], [place]  [address]   [phone]        Revision History              Electronically signed by Hanna Olam HERO., NP at 06/25/2017  5:24 PM EDT

## 2017-06-11 NOTE — Progress Notes (Signed)
 Formatting of this note is different from the original.    Problem: Self Care Deficits Care Plan (Adult)  Goal: *Acute Goals and Plan of Care (Insert Text)  Description  Occupational Therapy Goals  Initiated 06/11/2017  1.  Patient will perform grooming standing at sink for 10 minutes with no SOB with independence within 7 day(s).  2.  Patient will perform bathing using appropriate DME with independence within 7 day(s).  3.  Patient will perform lower body dressing with independence within 7 day(s).  4.  Patient will perform toilet transfers with independence within 7 day(s).  5.  Patient will perform all aspects of toileting with independence within 7 day(s).  6.  Patient will participate in upper extremity therapeutic exercise/activities with independence for 5 minutes within 7 day(s).    7.  Patient will utilize energy conservation techniques during functional activities with verbal cues within 7 day(s).      Outcome: Progressing Towards Goal    OCCUPATIONAL THERAPY EVALUATION  Patient: Allison Newman (77 y.o. female)  Date: 06/11/2017  Primary Diagnosis: Acute CHF (congestive heart failure) (HCC) [I50.9]    Precautions:  Fall    ASSESSMENT :  Based on the objective data described below, patient presents with Independent upper body ADLs, Independent - Supervision lower body ADLs, and Supervision for functional mobility with RW s/p admission for acute CHF. Patient appears to be functioning close to her baseline, however noted some DOE on exertion during ADL tasks. Patient SPO2 92-93% on RA with exertion, HR up to 113 bpm when up. Barthel Index indicating deficits in ~20% of ADLs at this time. Patient lives at home with her 60 year old grandson and has supportive sons in area to assist as needed.     The following are barriers to ADL independence while in acute care:   - Cognitive and/or behavioral: none   - Medical condition: ROM, functional reach, functional endurance, pain tolerance, girth and medical history    -  Other:       Patient will benefit from skilled acute intervention to address the above impairments.  Patient?s rehabilitation potential is considered to be Good    Discharge recommendations: Home health (to increase independence and safety)  If above is not an option then recommend: Outpatient    Barriers to discharging home, in addition to above listed impairments: family availability to assist.    Equipment recommendations for successful discharge (if) home: none - patient has all needed DME (see PT for further details of mobility)      PLAN :  Recommendations and Planned Interventions: self care training, functional mobility training, therapeutic exercise, balance training, therapeutic activities, endurance activities, patient education, home safety training and family training/education    Frequency/Duration: Patient will be followed by occupational therapy 2 times a week to address goals.     SUBJECTIVE:   Patient stated ?I have a strapping young grandson who helps me when I need it.?    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  Diabetes (HCC)     Elevated BUN 09/13/2011    Finger lesion 03/02/2016    Gallbladder polyp 04/08/2013    Gastrointestinal disorder     GERD    GERD (gastroesophageal reflux disease)     Hypercholesterolemia     Hypertension     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    Microalbuminuria 08/29/2013    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, adult (HCC) 08/28/2011    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    On aspirin  at home 05/03/2015    Other unknown and unspecified  cause of morbidity or mortality     hx of bronchitis    Rash, skin 09/25/2012    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 Patient largely mod I with ADLs and functional mobility at baseline. Patient has all needed DME.     Home Situation  Home Environment: Private residence  One/Two Story Residence: Two story  Living Alone: No  Support Systems: Family member(s), Church / faith community  Patient Expects to be Discharged to:: Private residence  Current DME Used/Available at Home: [place], straight, Environmental consultant, rolling, Paediatric nurse, Corporate investment banker Type: Advice worker dominance: Right    EXAMINATION OF PERFORMANCE DEFICITS:  Cognitive/Behavioral Status:  Neurologic State: Alert  Orientation Level: Oriented X4  Cognition: Appropriate for age attention/concentration;Follows commands  Perception: Appears intact  Perseveration: No perseveration noted  Safety/Judgement: Awareness of environment;Fall prevention    Skin: Appears grossly intact    Edema: none noted in BUEs    Hearing:  Auditory  Auditory Impairment: None    Vision/Perceptual:    Tracking: Able to track stimulus in all quadrants w/o difficulty    Diplopia: No    Acuity: Within Defined Limits    Corrective Lenses: Reading glasses    Range of Motion:  In BUEs  AROM: Generally decreased, functional(2* baseline arthritic restriction)    Strength:  In BUEs  Strength: Generally decreased, functional    Coordination:  Coordination: Within functional limits  Fine Motor Skills-Upper: Left Intact;Right Intact    Gross Motor Skills-Upper: Left Intact;Right  Intact    Tone & Sensation:  In BUEs  Tone: Normal  Sensation: Intact    Balance:  Sitting: Intact;Without support  Standing: Intact;With support    Functional Mobility and Transfers for ADLs:  Bed Mobility:  Rolling: (NT - recieved in chair and returned to chair)    Transfers:  Sit to Stand: Supervision;Adaptive equipment(RW)  Stand to Sit: Supervision;Adaptive equipment(RW)  Toilet Transfer : Supervision;Adaptive equipment(grab bar and RW)    ADL Assessment:  Feeding: Independent(Infer per obs of func reach, BUE ROM, FM/GM coordination)    Oral Facial Hygiene/Grooming: Independent(Infer per obs of func reach, BUE ROM, FM/GM coordination)    Bathing: Independent(IND sitting at baseline)    Upper Body Dressing: Independent(Infer per obs of func reach, BUE ROM, FM/GM coordination)    Lower Body Dressing: Independent(Infer  per obs of func mob, balance, func reach)    Toileting: Supervision    ADL Intervention and task modifications:    Grooming  Washing Hands: Independent(standing at sink )    Toileting  Toileting Assistance: Supervision  Bladder Hygiene: Supervision/set-up  Bowel Hygiene: Supervision/set-up  Clothing Management: Supervision/set-up  Adaptive Equipment: Grab bars;Walker    Cognitive Retraining  Safety/Judgement: Awareness of environment;Fall prevention    Functional Measure:  Barthel Index:    Bathing: 5  Bladder: 10  Bowels: 10  Grooming: 5  Dressing: 10  Feeding: 10  Mobility: 10  Stairs: 0  Toilet Use: 10  Transfer (Bed to Chair and Back): 10  Total: 80/100      Percentage of impairment   0%   1-19%   20-39%   40-59%   60-79%   80-99%   100%   Barthel Score 0-100 100 99-80 79-60 59-40 20-39 1-19   0     The Barthel ADL Index: Guidelines  1. The index should be used as a record of what a patient does, not as a record of what a patient could do.  2. The main aim is to establish degree of independence from any help, physical or verbal, however minor and for whatever reason.  3. The need for supervision  renders the patient not independent.  4. A patient's performance should be established using the best available evidence. Asking the patient, friends/relatives and nurses are the usual sources, but direct observation and common sense are also important. However direct testing is not needed.  5. Usually the patient's performance over the preceding 24-48 hours is important, but occasionally longer periods will be relevant.  6. Middle categories imply that the patient supplies over 50 per cent of the effort.  7. Use of aids to be independent is allowed.    Henriette Desanctis., Barthel, D.W. [phone]). Functional evaluation: the Barthel Index. Md State Med J (14)2.  Fleeta der [place], J.J.M.F, [place], DAIVD., [place], [place]. (1999). Measuring the change indisability after inpatient rehabilitation; comparison of the responsiveness of the Barthel Index and Functional Independence Measure. Journal of Neurology, Neurosurgery, and Psychiatry, 66(4), [phone].  Fleeta [place], N.J.A, [place],  W.J.M, & [place], M.A. (2004.) Assessment of post-stroke quality of life in cost-effectiveness studies: The usefulness of the Barthel Index and the EuroQoL-5D. Quality of Life Research, 42, 572-56     Occupational Therapy Evaluation Charge Determination   History Examination Decision-Making   LOW Complexity : Brief history review  LOW Complexity : 1-3 performance deficits relating to physical, cognitive , or psychosocial skils that result in activity limitations and / or participation restrictions  LOW Complexity : No comorbidities that affect functional and no verbal or physical assistance needed to complete eval tasks      Based on the above components, the patient evaluation is determined to be of the following complexity level: LOW   Pain:  Pre treatment: 0 /10   During treatment: 0/10  Post treatment:  0/10   Location: none    Description:none    Aggravating factors:     Activity Tolerance:   Good and requires rest  breaks  Please refer to the flowsheet for vital signs taken during this treatment.    After treatment patient left:   Up in chair  Call light within reach  RN notified   COMMUNICATION/EDUCATION:   The patient?s plan of care was discussed with: Physical Therapist and Registered Nurse.    Home safety education was  provided and the patient/caregiver indicated understanding. and Patient/family have participated as able in goal setting and plan of care.    This patient?s plan of care is appropriate for delegation to OTA.    Thank you for this referral.  Laymon LITTIE Ards, OT  Time Calculation: 16 mins    Electronically signed by Ards Laymon LITTIE, OT at 06/11/2017 10:20 AM EDT

## 2017-06-11 NOTE — Unmapped (Signed)
 Formatting of this note might be different from the original.  Vannie order sent to [employer]. Awaiting auth. Vannie will be delivered to room prior to d/c [phone]  Electronically signed by Gladis Area at 06/11/2017  8:36 AM EDT

## 2017-06-11 NOTE — Progress Notes (Signed)
 Formatting of this note is different from the original.    Problem: Mobility Impaired (Adult and Pediatric)  Goal: *Acute Goals and Plan of Care (Insert Text)  Description  Physical Therapy Goals  Initiated 06/10/2017  1.  Patient will move from supine to sit and sit to supine  and roll side to side in bed with modified independence within 7 day(s).    2.  Patient will transfer from bed to chair and chair to bed with modified independence using the least restrictive device within 7 day(s).  3.  Patient will perform sit to stand with modified independence within 7 day(s).  4.  Patient will ambulate with modified independence for 300 feet with the least restrictive device within 7 day(s).   5.  Patient will ascend/descend 6 stairs with 1 handrail(s) with modified independence within 7 day(s).   Outcome: Progressing Towards Goal    PHYSICAL THERAPY TREATMENT  Patient: Allison Newman (77 y.o. female)  Date: 06/11/2017  Diagnosis: Acute CHF (congestive heart failure) (HCC) [I50.9] Acute pulmonary embolism (HCC)    Precautions: Fall  Chart, physical therapy assessment, plan of care and goals were reviewed.    ASSESSMENT:  Based on the objective data described below, the patient presents with Modified independent level overall for transfers. Gait training completed at Supervision, 150 feet and using a rolling walker.   The following are barriers to independence while in acute care:   -Cognitive and/or behavioral: safety awareness and perception of pain  -Medical condition: ROM, strength, functional endurance, standing balance, pain tolerance and medical history    -Other:       Prior level of function: ambulates with RW, lives with a grandson who is 71 y/o, multi level home      PLAN:  Patient continues to benefit from skilled intervention to address the above impairments.  Continue treatment per established plan of care.  Recommend with staff: ambulate with RW and supervision 3 x day  Recommend next PT session: gait  training, stair training and strengthening  Discharge recommendations: Home health (to increase independence and safety)  If above is not an option then recommend: None    Patient's barriers to discharging home, in addition to above impairments: level of physical assist required to maintain patient safety.    Equipment recommendations for successful discharge (if) home: none, patient has RW      SUBJECTIVE:   Patient stated ?My knees and back hurt.?    OBJECTIVE DATA SUMMARY:   Critical Behavior:  Neurologic State: Alert  Orientation Level: Oriented X4  Cognition: Appropriate for age attention/concentration, Follows commands  Safety/Judgement: Awareness of environment, Fall prevention  Functional Mobility Training:  Bed Mobility:  Rolling: (NT - recieved in chair and returned to chair)            Transfers:  Sit to Stand: Modified independent  Stand to Sit: Modified independent                Balance:  Sitting: Intact  Standing: Intact;With support  Ambulation/Gait Training:  Distance (ft): 150 Feet (ft)  Assistive Device: Walker, rolling;Gait belt  Ambulation - Level of Assistance: Supervision      Gait Abnormalities: Decreased step clearance      Base of Support: Narrowed    Speed/Cadence: Pace decreased (<100 feet/min)  Step Length: Right shortened;Left shortened             Patient ambulated with short, slow steps and steady gait using Rw. Multiple standing rest breaks  needed due to fatigue and pain  Stairs:   Patient declined to perform stairs during treatment session. Patient reports she enters into the back of home with 1 step to enter        Pain:  Pre treatment: 7 /10   During treatment: 7/10  Post treatment:  7/10   Location: back and left knee    Description:aching   Aggravating factors:     Activity Tolerance:   Good  Please refer to the flowsheet for vital signs taken during this treatment.    After treatment patient left:   Up in chair  Call light within reach  RN notified      COMMUNICATION/COLLABORATION:   The patient?s plan of care was discussed with: Registered Nurse    Lucie CHRISTELLA Lefort, PT   Time Calculation: 14 mins      Electronically signed by Lefort Lucie CHRISTELLA, PT at 06/11/2017  1:09 PM EDT

## 2017-06-11 NOTE — Discharge Summary (Signed)
 Formatting of this note is different from the original.  Images from the original note were not included.          Inpatient hospitalist discharge summary                Brief Overview    PATIENT ID: Allison Newman    MRN: [id number]     DATE OF BIRTH: 02/04/1941    Admitting Provider: Thersia DELENA Fairly, MD    Discharging Provider: Zeeshan Khakwani, MD   To contact this individual call [phone] and ask the operator to page.  If unavailable ask to be transferred the Adult Hospitalist Department.    PCP at discharge: Viktoria Arty SAILOR, MD [phone]   [address] [place] [address]    Admission date: 06/07/2017  Date of Discharge: 06/11/17    Chief complaint:   Chief Complaint   Patient presents with   ? Shortness of Breath     Patient Active Problem List   Diagnosis Code   ? Essential hypertension I10   ? 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, adult (HCC) E66.01, Z68.44   ? Neuropathic arthritis M14.60   ? Low TSH level R79.89   ? Acid reflux K21.9   ? Hypercholesterolemia E78.00   ? Rash in adult R21   ? Lung nodule seen on imaging study R91.1   ? 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 Chronic right hip pain M25.551, G89.29   ? Gallbladder polyp K82.4   ? Umbilical hernia K42.9   ? Microalbuminuria R80.9   ? Abnormal finding on EKG R94.31   ? Slow transit constipation K59.01   ? Chronic pain of left knee M25.562, G89.29   ? Osteopenia M85.80   ? ACP (advance care planning) Z71.89   ? 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 Morbid obesity (HCC) E66.01   ? Acute CHF (congestive heart failure) (HCC) I50.9   ? Acute pulmonary embolism (HCC) I26.99   ? DM2 (diabetes mellitus, type 2) (HCC) E11.9   ? Hyperthyroidism E05.90     Primary discharge diagnosis:  Acute hypoxemic respiratory failure sec to Bilateral PE, POA  NO CHF NOTED    Secondary discharge diagnosis:  Morbid  obesity  HTN  HLP  NIDDM2  Hyperthyroidism  Lung nodule, unchanged since 2014  Goiter    Discharge Disposition:  Home or Self Care with HHN/PT/OT    Discharge activity:  Ambulate in house    Code status at discharge:  Full Code     Active issues requiring follow up:  Acute PE  Needs age appropriate cancer screening  May need sleep studies    Outpatient follow up:    Future appointments-  Future Appointments   Date Time Provider Department Center   08/28/2017 10:45 AM Viktoria Arty SAILOR, MD [facility] SCHED     Follow-up Information     Follow up With Specialties Details Why Contact Info    Viktoria Arty SAILOR, MD Internal Medicine   [address]  [address] [place] [address]  [phone]     Viktoria Arty SAILOR, MD Internal Medicine Schedule an appointment as soon as possible for a visit in 1 week for follow Up, as recommended. discuss Pap smear, mammogram and other agre appropriate screening [address]  [address] [place]  [address]  [phone]        Test results pending upon discharge:  n/a          Details of hospital stay    Presenting problem/ History of present illness:  Acute CHF (congestive heart failure) (HCC) [I50.9]    Allison Newman is a 77 y.o. female  Presented with SOB.    Hospital Course:  Acute hypoxemic respiratory failure sec to Bilateral PE, POA  O2 sats improved  NIV neg for DVT but limited studies technically.  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  Patient was hemodynamically stable, offering no new complaints. Denies any shortness of breath at rest, no fevers or chills, no diarrhea or constipation. Patient is agreeable for discharge. Patient understood and verbalized the understanding of the discharge plan.    Patient was advised to seek medical help/ care or return to ED, if symptoms recur, worsen or new symptoms develop.    Operative procedures performed:    Treatments: IV hydration and Lovenox , lasix  and Eliquis    Consults:  IP CONSULT TO CARDIOLOGY    Procedures: none     Diet:  DIET DIABETIC CONSISTENT CARB    Pertinent test results:  Xr Chest Pa Lat    Result Date: 06/07/2017  CLINICAL HISTORY: Hypoxia INDICATION: Hypoxia COMPARISON: 10/27/2014 FINDINGS: PA and lateral views of the chest are obtained. The cardiopericardial silhouette is prominent. Diminished lung volumes. Pulmonary artery enlargement is suggested.. There is no pleural effusion, pneumothorax or focal consolidation present.     IMPRESSION: Mild cardiomegaly. Diminished lung volumes with pulmonary artery enlargement suggested.    Cta Chest W Or W Wo Cont    Result Date: 06/08/2017  EXAM:  CTA CHEST W OR W WO CONT INDICATION:  shortness of breath, PE, COMPARISON: PROCEDURE: During rapid intravenous bolus infusion 97 ml Isovue  370 thin spiral axial images were obtained through the chest. Coronal MIP reconstructions of the pulmonary arteries as well as sagittal and coronal standard reconstruction were obtained for evaluation of the pulmonary arteries. Standard axial soft issue and lung windows.  CT dose reduction was achieved through use of a standardized protocol tailored for this examination and automatic exposure control for dose modulation.  FINDINGS: Multiple intraluminal filling defects in pulmonary arteries consistent with pulmonary emboli. There is no hilar, mediastinal or axillary adenopathy. Again demonstrated is an upper thoracic goiter which is displacing the trachea to the left and causing mild compression. Minimal atelectasis left base. In the right upper lobe apical region is an approximately 5 mm nodule which is unchanged in size when  compared on coronal images with the prior chest CT 06/25/12. No other parenchymal nodules, lesions, infiltrates demonstrated. Imaging through the upper abdomen is unremarkable. Scoliosis and mild chronic degenerative changes in the thoracic spine again noted.     IMPRESSION: 1. Multiple pulmonary emboli to both lungs. 2. Again demonstrated is upper thoracic goiter with mild displacement and compression of the trachea similar to 06/25/12. 3. No change in approximately 5 mm right apical pulmonary nodule since 2014.   789 The findings were called to hospitalist by me at 11:30 AM    Recent Results (from the past 336 hour(s))   EKG, 12 LEAD, INITIAL    Collection Time: 06/07/17  7:49 PM   Result Value Ref Range    Ventricular Rate 79 BPM    Atrial Rate 79 BPM    P-R Interval 170 ms    QRS Duration 86 ms    Q-T Interval 372 ms    QTC Calculation (Bezet) 426 ms    Calculated P Axis 52 degrees    Calculated R Axis 25 degrees    Calculated T Axis -38 degrees    Diagnosis       Normal sinus rhythm  T wave abnormality, consider inferior ischemia  T wave abnormality, consider anterolateral ischemia  When compared with ECG of 11-Nov-2013 13:17,  Inverted T waves have replaced nonspecific T wave abnormality in   Anterolateral leads  Confirmed by Maranda, M.D., [place] [phone]) on 06/08/2017 8:20:39 AM    SAMPLES BEING HELD    Collection Time: 06/07/17  7:54 PM   Result Value Ref Range    SAMPLES BEING HELD 1PST 1BLU 1RED 1LAV     COMMENT        Add-on orders for these samples will be processed based on acceptable specimen  integrity and analyte stability, which may vary by analyte.   CBC WITH AUTOMATED DIFF    Collection Time: 06/07/17  7:54 PM   Result Value Ref Range    WBC 7.3 3.6 - 11.0 K/uL    RBC 4.54 3.80 - 5.20 M/uL    HGB 13.1 11.5 - 16.0 g/dL    HCT 57.7 64.9 - 52.9 %    MCV 93.0 80.0 - 99.0 FL    MCH 28.9 26.0 - 34.0 PG    MCHC 31.0 30.0 - 36.5 g/dL    RDW 85.8 88.4 - 85.4 %    PLATELET 137 (L) 150 - 400 K/uL    MPV 12.6 8.9 - 12.9 FL    NRBC 0.0 0 PER 100 WBC    ABSOLUTE NRBC 0.00 0.00 - 0.01 K/uL    NEUTROPHILS 64 32 - 75 %    LYMPHOCYTES 23 12 - 49 %    MONOCYTES 9 5 - 13 %    EOSINOPHILS 3 0 - 7 %    BASOPHILS 1 0 - 1 %    IMMATURE GRANULOCYTES 0 0.0 - 0.5 %    ABS. NEUTROPHILS 4.7 1.8 - 8.0 K/UL    ABS. LYMPHOCYTES 1.7 0.8 - 3.5 K/UL    ABS. MONOCYTES 0.7 0.0 - 1.0 K/UL    ABS. EOSINOPHILS 0.2 0.0 - 0.4 K/UL    ABS. BASOPHILS 0.0 0.0 - 0.1 K/UL    ABS. IMM. GRANS. 0.0 0.00 - 0.04 K/UL    DF AUTOMATED     METABOLIC PANEL, BASIC    Collection Time: 06/07/17  7:54 PM   Result Value Ref Range    Sodium 142 136 - 145 mmol/L  Potassium 4.0 3.5 - 5.1 mmol/L    Chloride 109 (H) 97 - 108 mmol/L    CO2 26 21 - 32 mmol/L    Anion gap 7 5 - 15 mmol/L    Glucose 161 (H) 65 - 100 mg/dL    BUN 24 (H) 6 - 20 MG/DL    Creatinine 9.02 9.44 - 1.02 MG/DL    BUN/Creatinine ratio 25 (H) 12 - 20      GFR est AA >60 >60 ml/min/1.37m2    GFR est non-AA 56 (L) >60 ml/min/1.73m2    Calcium  9.4 8.5 - 10.1 MG/DL   NT-PRO BNP    Collection Time: 06/07/17  7:54 PM   Result Value Ref Range    NT pro-BNP 1,164 (H) <125 PG/ML   TROPONIN I    Collection Time: 06/07/17  7:54 PM   Result Value Ref Range    Troponin-I, Qt. <0.05 <0.05 ng/mL   TSH 3RD GENERATION    Collection Time: 06/08/17  3:50 AM   Result Value Ref Range    TSH 1.33 0.36 - 3.74 uIU/mL   METABOLIC PANEL, COMPREHENSIVE    Collection Time: 06/08/17  4:00 AM   Result Value Ref Range    Sodium 139 136 - 145 mmol/L    Potassium 3.7 3.5 - 5.1 mmol/L    Chloride 107 97 - 108 mmol/L    CO2 24  21 - 32 mmol/L    Anion gap 8 5 - 15 mmol/L    Glucose 219 (H) 65 - 100 mg/dL    BUN 22 (H) 6 - 20 MG/DL    Creatinine 9.07 9.44 - 1.02 MG/DL    BUN/Creatinine ratio 24 (H) 12 - 20      GFR est AA >60 >60 ml/min/1.108m2    GFR est non-AA 60 (L) >60 ml/min/1.80m2    Calcium  9.1 8.5 - 10.1 MG/DL    Bilirubin, total 0.6 0.2 - 1.0 MG/DL    ALT (SGPT) 13 12 - 78 U/L    AST (SGOT) 14 (L) 15 - 37 U/L    Alk. phosphatase 63 45 - 117 U/L    Protein, total 7.1 6.4 - 8.2 g/dL    Albumin  3.0 (L) 3.5 - 5.0 g/dL    Globulin 4.1 (H) 2.0 - 4.0 g/dL    A-G Ratio 0.7 (L) 1.1 - 2.2     LIPID PANEL    Collection Time: 06/08/17  4:00 AM   Result Value Ref Range    LIPID PROFILE          Cholesterol, total 116 <200 MG/DL    Triglyceride 99 <849 MG/DL    HDL Cholesterol 50 MG/DL    LDL, calculated 53.7 0 - 100 MG/DL    VLDL, calculated 80.1 MG/DL    CHOL/HDL Ratio 2.3 0.0 - 5.0     CBC WITH AUTOMATED DIFF    Collection Time: 06/08/17  4:00 AM   Result Value Ref Range    WBC 6.6 3.6 - 11.0 K/uL    RBC 4.44 3.80 - 5.20 M/uL    HGB 12.7 11.5 - 16.0 g/dL    HCT 58.9 64.9 - 52.9 %    MCV 92.3 80.0 - 99.0 FL    MCH 28.6 26.0 - 34.0 PG    MCHC 31.0 30.0 - 36.5 g/dL    RDW 85.9 88.4 - 85.4 %    PLATELET 128 (L) 150 - 400 K/uL    MPV 12.1 8.9 - 12.9 FL  NRBC 0.0 0 PER 100 WBC    ABSOLUTE NRBC 0.00 0.00 - 0.01 K/uL    NEUTROPHILS 59 32 - 75 %    LYMPHOCYTES 27 12 - 49 %    MONOCYTES 9 5 - 13 %    EOSINOPHILS 4 0 - 7 %    BASOPHILS 1 0 - 1 %    IMMATURE GRANULOCYTES 0 0.0 - 0.5 %    ABS. NEUTROPHILS 3.9 1.8 - 8.0 K/UL    ABS. LYMPHOCYTES 1.8 0.8 - 3.5 K/UL    ABS. MONOCYTES 0.6 0.0 - 1.0 K/UL    ABS. EOSINOPHILS 0.2 0.0 - 0.4 K/UL    ABS. BASOPHILS 0.0 0.0 - 0.1 K/UL    ABS. IMM. GRANS. 0.0 0.00 - 0.04 K/UL    DF AUTOMATED     MAGNESIUM     Collection Time: 06/08/17  4:00 AM   Result Value Ref Range    Magnesium  1.9 1.6 - 2.4 mg/dL   PHOSPHORUS    Collection Time: 06/08/17  4:00 AM   Result Value Ref Range    Phosphorus 3.8 2.6 - 4.7 MG/DL    TROPONIN I    Collection Time: 06/08/17  4:00 AM   Result Value Ref Range    Troponin-I, Qt. <0.05 <0.05 ng/mL   CK W/ CKMB & INDEX    Collection Time: 06/08/17  4:00 AM   Result Value Ref Range    CK 115 26 - 192 U/L    CK - MB 2.1 <3.6 NG/ML    CK-MB Index 1.8 0.0 - 2.5     HEMOGLOBIN A1C WITH EAG    Collection Time: 06/08/17  4:05 AM   Result Value Ref Range    Hemoglobin A1c 7.7 (H) 4.2 - 6.3 %    Est. average glucose 174 mg/dL   GLUCOSE, POC    Collection Time: 06/08/17  6:23 AM   Result Value Ref Range    Glucose (POC) 181 (H) 65 - 100 mg/dL    Performed by LUNA CYNTHIA    GLUCOSE, POC    Collection Time: 06/08/17 11:46 AM   Result Value Ref Range    Glucose (POC) 179 (H) 65 - 100 mg/dL    Performed by PROTHEROE SWAZILAND    ECHO ADULT COMPLETE    Collection Time: 06/08/17  4:44 PM   Result Value Ref Range    Tapse 2.11 (A) 1.5 - 2.0 cm    Ao Root D 3.33 cm    PASP 45.0 mmHg   GLUCOSE, POC    Collection Time: 06/08/17  4:54 PM   Result Value Ref Range    Glucose (POC) 169 (H) 65 - 100 mg/dL    Performed by EDSEL KRONE    GLUCOSE, POC    Collection Time: 06/08/17  9:13 PM   Result Value Ref Range    Glucose (POC) 219 (H) 65 - 100 mg/dL    Performed by EDSEL KRONE    CBC WITH AUTOMATED DIFF    Collection Time: 06/09/17  3:46 AM   Result Value Ref Range    WBC 6.8 3.6 - 11.0 K/uL    RBC 4.52 3.80 - 5.20 M/uL    HGB 12.9 11.5 - 16.0 g/dL    HCT 58.0 64.9 - 52.9 %    MCV 92.7 80.0 - 99.0 FL    MCH 28.5 26.0 - 34.0 PG    MCHC 30.8 30.0 - 36.5 g/dL    RDW 85.8 88.4 - 85.4 %  PLATELET 130 (L) 150 - 400 K/uL    MPV 12.5 8.9 - 12.9 FL    NRBC 0.0 0 PER 100 WBC    ABSOLUTE NRBC 0.00 0.00 - 0.01 K/uL    NEUTROPHILS 53 32 - 75 %    LYMPHOCYTES 32 12 - 49 %    MONOCYTES 11 5 - 13 %    EOSINOPHILS 3 0 - 7 %    BASOPHILS 1 0 - 1 %    IMMATURE GRANULOCYTES 0 0.0 - 0.5 %    ABS. NEUTROPHILS 3.7 1.8 - 8.0 K/UL    ABS. LYMPHOCYTES 2.2 0.8 - 3.5 K/UL    ABS. MONOCYTES 0.7 0.0 - 1.0 K/UL    ABS. EOSINOPHILS 0.2 0.0  - 0.4 K/UL    ABS. BASOPHILS 0.0 0.0 - 0.1 K/UL    ABS. IMM. GRANS. 0.0 0.00 - 0.04 K/UL    DF AUTOMATED     METABOLIC PANEL, BASIC    Collection Time: 06/09/17  3:46 AM   Result Value Ref Range    Sodium 135 (L) 136 - 145 mmol/L    Potassium 3.8 3.5 - 5.1 mmol/L    Chloride 103 97 - 108 mmol/L    CO2 27 21 - 32 mmol/L    Anion gap 5 5 - 15 mmol/L    Glucose 178 (H) 65 - 100 mg/dL    BUN 26 (H) 6 - 20 MG/DL    Creatinine 9.04 9.44 - 1.02 MG/DL    BUN/Creatinine ratio 27 (H) 12 - 20      GFR est AA >60 >60 ml/min/1.24m2    GFR est non-AA 58 (L) >60 ml/min/1.80m2    Calcium  8.7 8.5 - 10.1 MG/DL   T4, FREE    Collection Time: 06/09/17  3:46 AM   Result Value Ref Range    T4, Free 1.2 0.8 - 1.5 NG/DL   GLUCOSE, POC    Collection Time: 06/09/17  6:34 AM   Result Value Ref Range    Glucose (POC) 182 (H) 65 - 100 mg/dL    Performed by Richardson [place]   PCT    GLUCOSE, POC    Collection Time: 06/09/17 12:18 PM   Result Value Ref Range    Glucose (POC) 189 (H) 65 - 100 mg/dL    Performed by [employer]    GLUCOSE, POC    Collection Time: 06/09/17  4:44 PM   Result Value Ref Range    Glucose (POC) 162 (H) 65 - 100 mg/dL    Performed by Unkown Operator    GLUCOSE, POC    Collection Time: 06/09/17  9:07 PM   Result Value Ref Range    Glucose (POC) 190 (H) 65 - 100 mg/dL    Performed by Tanda Side    CBC WITH AUTOMATED DIFF    Collection Time: 06/10/17  3:47 AM   Result Value Ref Range    WBC 6.2 3.6 - 11.0 K/uL    RBC 4.32 3.80 - 5.20 M/uL    HGB 12.7 11.5 - 16.0 g/dL    HCT 60.3 64.9 - 52.9 %    MCV 91.7 80.0 - 99.0 FL    MCH 29.4 26.0 - 34.0 PG    MCHC 32.1 30.0 - 36.5 g/dL    RDW 86.3 88.4 - 85.4 %    PLATELET 132 (L) 150 - 400 K/uL    MPV 12.6 8.9 - 12.9 FL    NRBC 0.0 0 PER 100  WBC    ABSOLUTE NRBC 0.00 0.00 - 0.01 K/uL    NEUTROPHILS 55 32 - 75 %    LYMPHOCYTES 30 12 - 49 %    MONOCYTES 10 5 - 13 %    EOSINOPHILS 4 0 - 7 %    BASOPHILS 1 0 - 1 %    IMMATURE GRANULOCYTES 0 0.0 - 0.5 %    ABS. NEUTROPHILS  3.4 1.8 - 8.0 K/UL    ABS. LYMPHOCYTES 1.9 0.8 - 3.5 K/UL    ABS. MONOCYTES 0.6 0.0 - 1.0 K/UL    ABS. EOSINOPHILS 0.3 0.0 - 0.4 K/UL    ABS. BASOPHILS 0.0 0.0 - 0.1 K/UL    ABS. IMM. GRANS. 0.0 0.00 - 0.04 K/UL    DF AUTOMATED     METABOLIC PANEL, BASIC    Collection Time: 06/10/17  3:47 AM   Result Value Ref Range    Sodium 135 (L) 136 - 145 mmol/L    Potassium 3.7 3.5 - 5.1 mmol/L    Chloride 103 97 - 108 mmol/L    CO2 26 21 - 32 mmol/L    Anion gap 6 5 - 15 mmol/L    Glucose 188 (H) 65 - 100 mg/dL    BUN 23 (H) 6 - 20 MG/DL    Creatinine 9.18 9.44 - 1.02 MG/DL    BUN/Creatinine ratio 28 (H) 12 - 20      GFR est AA >60 >60 ml/min/1.66m2    GFR est non-AA >60 >60 ml/min/1.63m2    Calcium  8.7 8.5 - 10.1 MG/DL   GLUCOSE, POC    Collection Time: 06/10/17  6:35 AM   Result Value Ref Range    Glucose (POC) 174 (H) 65 - 100 mg/dL    Performed by Tanda Side    GLUCOSE, POC    Collection Time: 06/10/17 11:23 AM   Result Value Ref Range    Glucose (POC) 168 (H) 65 - 100 mg/dL    Performed by Austin Stabile    GLUCOSE, POC    Collection Time: 06/10/17  4:34 PM   Result Value Ref Range    Glucose (POC) 164 (H) 65 - 100 mg/dL    Performed by Celestia Antionette    GLUCOSE, POC    Collection Time: 06/10/17  8:39 PM   Result Value Ref Range    Glucose (POC) 163 (H) 65 - 100 mg/dL    Performed by Dasie Angry    CBC WITH AUTOMATED DIFF    Collection Time: 06/11/17  5:11 AM   Result Value Ref Range    WBC 5.8 3.6 - 11.0 K/uL    RBC 4.51 3.80 - 5.20 M/uL    HGB 13.2 11.5 - 16.0 g/dL    HCT 58.4 64.9 - 52.9 %    MCV 92.0 80.0 - 99.0 FL    MCH 29.3 26.0 - 34.0 PG    MCHC 31.8 30.0 - 36.5 g/dL    RDW 86.2 88.4 - 85.4 %    PLATELET 144 (L) 150 - 400 K/uL    MPV 12.1 8.9 - 12.9 FL    NRBC 0.0 0 PER 100 WBC    ABSOLUTE NRBC 0.00 0.00 - 0.01 K/uL    NEUTROPHILS 53 32 - 75 %    LYMPHOCYTES 32 12 - 49 %    MONOCYTES 11 5 - 13 %    EOSINOPHILS 4 0 - 7 %    BASOPHILS 0 0 - 1 %  IMMATURE GRANULOCYTES 0 0.0 - 0.5 %    ABS. NEUTROPHILS  3.0 1.8 - 8.0 K/UL    ABS. LYMPHOCYTES 1.9 0.8 - 3.5 K/UL    ABS. MONOCYTES 0.7 0.0 - 1.0 K/UL    ABS. EOSINOPHILS 0.3 0.0 - 0.4 K/UL    ABS. BASOPHILS 0.0 0.0 - 0.1 K/UL    ABS. IMM. GRANS. 0.0 0.00 - 0.04 K/UL    DF AUTOMATED     METABOLIC PANEL, BASIC    Collection Time: 06/11/17  5:11 AM   Result Value Ref Range    Sodium 134 (L) 136 - 145 mmol/L    Potassium 4.2 3.5 - 5.1 mmol/L    Chloride 103 97 - 108 mmol/L    CO2 24 21 - 32 mmol/L    Anion gap 7 5 - 15 mmol/L    Glucose 155 (H) 65 - 100 mg/dL    BUN 27 (H) 6 - 20 MG/DL    Creatinine 9.02 9.44 - 1.02 MG/DL    BUN/Creatinine ratio 28 (H) 12 - 20      GFR est AA >60 >60 ml/min/1.9m2    GFR est non-AA 56 (L) >60 ml/min/1.68m2    Calcium  9.1 8.5 - 10.1 MG/DL   GLUCOSE, POC    Collection Time: 06/11/17  7:03 AM   Result Value Ref Range    Glucose (POC) 159 (H) 65 - 100 mg/dL    Performed by Dasie Angry      Physical Exam on Discharge:    Discharge condition: fair    Vital signs:   Patient Vitals for the past 12 hrs:   Temp Pulse Resp BP SpO2   06/11/17 0803 -- -- -- -- 94 %   06/11/17 0718 97.7 F (36.5 C) 69 16 135/58 95 %   06/11/17 0511 97.3 F (36.3 C) 69 20 155/75 97 %   06/10/17 2340 -- 73 -- 109/83 --   06/10/17 2328 98.2 F (36.8 C) 73 18 96/48 90 %     General appearance: alert, cooperative, no distress, appears stated age  Lungs: clear to auscultation bilaterally  Heart: regular rate and rhythm, S1, S2 normal, no murmur, click, rub or gallop    Current Discharge Medication List     START taking these medications    Details   apixaban (ELIQUIS) 5 mg tablet Take 10 mg (2 tabs) Orally twice a day till 06/16/17, and then start 5 mg (1 one tab) twice a day from 06/17/17  Qty: 72 Tab, Refills: 0       CONTINUE these medications which have NOT CHANGED    Details   lisinopril (PRINIVIL, ZESTRIL) 10 mg tablet Take 1 Tab by mouth daily.  Qty: 90 Tab, Refills: 1    Comments: **Patient requests 90 days supply**  Associated Diagnoses: HTN, goal below 140/90      metFORMIN (GLUCOPHAGE) 1,000 mg tablet Take 1 Tab by mouth two (2) times daily (with meals).  Qty: 180 Tab, Refills: 1     methIMAzole  (TAPAZOLE ) 5 mg tablet Take 1 Tab by mouth daily.  Qty: 90 Tab, Refills: 1     metoprolol  tartrate (LOPRESSOR ) 50 mg tablet Take 1 Tab by mouth two (2) times a day.  Qty: 180 Tab, Refills: 1    Comments: **Patient requests 90 days supply**  Associated Diagnoses: HTN, goal below 140/90     rosuvastatin  (CRESTOR ) 20 mg tablet Take 1 Tab by mouth nightly.  Qty: 90 Tab, Refills: 1  Associated Diagnoses: Diabetes mellitus without complication (HCC); Hypercholesterolemia; Microalbuminuria; Elevated BUN     triamterene -hydroCHLOROthiazide  (DYAZIDE ) 37.5-25 mg per capsule TAKE 1 CAPSULE BY MOUTH EVERY DAY  Qty: 90 Cap, Refills: 1    Associated Diagnoses: HTN, goal below 140/90     BREO ELLIPTA 200-25 mcg/dose inhaler INHALE 1 PUFF BY MOUTH DAILY  Qty: 1 Each, Refills: 5    Associated Diagnoses: COPD (chronic obstructive pulmonary disease) with chronic bronchitis (HCC)     !! lancets misc Use BID. Dx: E11.9  Qty: 100 Each, Refills: 11    Associated Diagnoses: Uncontrolled type 2 diabetes mellitus with hyperglycemia (HCC)     CALCIUM  600 + D tablet take 1 tablet by mouth twice a day  Qty: 60 Tab, Refills: 11     JANUMET 50-500 mg per tablet take 1 tablet by mouth once daily  Qty: 30 Tab, Refills: 1    Associated Diagnoses: Diabetes mellitus without complication (HCC); Hypercholesterolemia; Microalbuminuria; Elevated BUN     aspirin  delayed-release 81 mg tablet Take 1 Tab by mouth daily.  Qty: 30 Tab, Refills: 11    Associated Diagnoses: On aspirin  at home     oxyCODONE-acetaminophen  (PERCOCET 10) 10-325 mg per tablet take 1/2 TO 1 tablet once daily if needed for pain  Refills: 0     glucose blood VI test strips (BLOOD GLUCOSE TEST) strip Use BID DxE11.9  Qty: 100 Strip, Refills: 11    Associated Diagnoses: Uncontrolled type 2 diabetes mellitus with hyperglycemia (HCC)     nystatin  (MYCOSTATIN) powder Apply  to affected area four (4) times daily.  Qty: 120 g, Refills: 3    Associated Diagnoses: Skin candidiasis     aspirin -calcium  carbonate 81 mg-300 mg calcium (777 mg) tab Take 81 mg by mouth.     !! FREESTYLE LANCETS 28 gauge misc Refills: 1     cyanocobalamin 1,000 mcg tablet Take 1,000 mcg by mouth daily.      !! - Potential duplicate medications found. Please discuss with provider.       Total time spent on discharge planning, counseling and co-ordination of care:   33 minutes    Zeeshan Khakwani, MD  06/11/17  9:00 AM    NOTIFY YOUR PHYSICIAN FOR ANY OF THE FOLLOWING:   Fever over 101 degrees for 24 hours.   Chest pain, shortness of breath, fever, chills, nausea, vomiting, diarrhea, change in mentation, falling, weakness, bleeding. Severe pain or pain not relieved by medications.  Or, any other signs or symptoms that you may have questions about.    Electronically signed by Khakwani, Zeeshan, MD at 06/11/2017  1:23 PM EDT

## 2017-06-11 NOTE — Progress Notes (Signed)
Problem: Mobility Impaired (Adult and Pediatric)  Goal: *Acute Goals and Plan of Care (Insert Text)  Description  Physical Therapy Goals  Initiated 06/10/2017  1.  Patient will move from supine to sit and sit to supine  and roll side to side in bed with modified independence within 7 day(s).    2.  Patient will transfer from bed to chair and chair to bed with modified independence using the least restrictive device within 7 day(s).  3.  Patient will perform sit to stand with modified independence within 7 day(s).  4.  Patient will ambulate with modified independence for 300 feet with the least restrictive device within 7 day(s).   5.  Patient will ascend/descend 6 stairs with 1 handrail(s) with modified independence within 7 day(s).   Outcome: Progressing Towards Goal    PHYSICAL THERAPY TREATMENT  Patient: Allison Newman ([age] y.o. female)  Date: 06/11/2017  Diagnosis: Acute CHF (congestive heart failure) (HCC) [I50.9] Acute pulmonary embolism (HCC)       Precautions: Fall  Chart, physical therapy assessment, plan of care and goals were reviewed.    ASSESSMENT:  Based on the objective data described below, the patient presents with Modified independent level overall for transfers. Gait training completed at Supervision, 150 feet and using a rolling walker.   The following are barriers to independence while in acute care:   -Cognitive and/or behavioral: safety awareness and perception of pain  -Medical condition: ROM, strength, functional endurance, standing balance, pain tolerance and medical history    -Other:       Prior level of function: ambulates with RW, lives with a grandson who is 75 y/o, multi level home       PLAN:  Patient continues to benefit from skilled intervention to address the above impairments.  Continue treatment per established plan of care.  Recommend with staff: ambulate with RW and supervision 3 x day  Recommend next PT session: gait training, stair training and strengthening   Discharge recommendations: Home health (to increase independence and safety)  If above is not an option then recommend: None    Patient's barriers to discharging home, in addition to above impairments: level of physical assist required to maintain patient safety.    Equipment recommendations for successful discharge (if) home: none, patient has RW        SUBJECTIVE:   Patient stated ?My knees and back hurt.?    OBJECTIVE DATA SUMMARY:   Critical Behavior:  Neurologic State: Alert  Orientation Level: Oriented X4  Cognition: Appropriate for age attention/concentration, Follows commands  Safety/Judgement: Awareness of environment, Fall prevention  Functional Mobility Training:  Bed Mobility:  Rolling: (NT - recieved in chair and returned to chair)                 Transfers:  Sit to Stand: Modified independent  Stand to Sit: Modified independent                             Balance:  Sitting: Intact  Standing: Intact;With support  Ambulation/Gait Training:  Distance (ft): 150 Feet (ft)  Assistive Device: Walker, rolling;Gait belt  Ambulation - Level of Assistance: Supervision        Gait Abnormalities: Decreased step clearance        Base of Support: Narrowed     Speed/Cadence: Pace decreased (<100 feet/min)  Step Length: Right shortened;Left shortened  Patient ambulated with short, slow steps and steady gait using Rw. Multiple standing rest breaks needed due to fatigue and pain  Stairs:   Patient declined to perform stairs during treatment session. Patient reports she enters into the back of home with 1 step to enter           Pain:  Pre treatment: 7 /10   During treatment: 7/10  Post treatment:  7/10   Location: back and left knee    Description:aching   Aggravating factors:     Activity Tolerance:   Good  Please refer to the flowsheet for vital signs taken during this treatment.    After treatment patient left:   Up in chair  Call light within reach  RN notified     COMMUNICATION/COLLABORATION:    The patient?s plan of care was discussed with: Registered Nurse    Gwenlyn Found, PT   Time Calculation: 14 mins

## 2017-06-11 NOTE — Progress Notes (Signed)
Problem: Self Care Deficits Care Plan (Adult)  Goal: *Acute Goals and Plan of Care (Insert Text)  Description  Occupational Therapy Goals  Initiated 06/11/2017  1.  Patient will perform grooming standing at sink for 10 minutes with no SOB with independence within 7 day(s).  2.  Patient will perform bathing using appropriate DME with independence within 7 day(s).  3.  Patient will perform lower body dressing with independence within 7 day(s).  4.  Patient will perform toilet transfers with independence within 7 day(s).  5.  Patient will perform all aspects of toileting with independence within 7 day(s).  6.  Patient will participate in upper extremity therapeutic exercise/activities with independence for 5 minutes within 7 day(s).    7.  Patient will utilize energy conservation techniques during functional activities with verbal cues within 7 day(s).         Outcome: Progressing Towards Goal     OCCUPATIONAL THERAPY EVALUATION  Patient: Allison Newman (77 y.o. female)  Date: 06/11/2017  Primary Diagnosis: Acute CHF (congestive heart failure) (HCC) [I50.9]        Precautions:  Fall    ASSESSMENT :  Based on the objective data described below, patient presents with Independent upper body ADLs, Independent - Supervision lower body ADLs, and Supervision for functional mobility with RW s/p admission for acute CHF. Patient appears to be functioning close to her baseline, however noted some DOE on exertion during ADL tasks. Patient SPO2 92-93% on RA with exertion, HR up to 113 bpm when up. Barthel Index indicating deficits in ~20% of ADLs at this time. Patient lives at home with her 39 year old grandson and has supportive sons in area to assist as needed.     The following are barriers to ADL independence while in acute care:   - Cognitive and/or behavioral: none   - Medical condition: ROM, functional reach, functional endurance, pain tolerance, girth and medical history    - Other:        Patient will benefit from skilled acute intervention to address the above impairments.  Patient?s rehabilitation potential is considered to be Good    Discharge recommendations: Home health (to increase independence and safety)  If above is not an option then recommend: Outpatient    Barriers to discharging home, in addition to above listed impairments: family availability to assist.     Equipment recommendations for successful discharge (if) home: none - patient has all needed DME (see PT for further details of mobility)      PLAN :  Recommendations and Planned Interventions: self care training, functional mobility training, therapeutic exercise, balance training, therapeutic activities, endurance activities, patient education, home safety training and family training/education    Frequency/Duration: Patient will be followed by occupational therapy 2 times a week to address goals.     SUBJECTIVE:   Patient stated ?I have a strapping young grandson who helps me when I need it.?    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    Diabetes (  HCC)     Elevated BUN 09/13/2011    Finger lesion 03/02/2016    Gallbladder polyp 04/08/2013    Gastrointestinal disorder     GERD    GERD (gastroesophageal reflux disease)     Hypercholesterolemia     Hypertension     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    Microalbuminuria 08/29/2013    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, adult (HCC) 08/28/2011    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    On aspirin at home 05/03/2015     Other unknown and unspecified cause of morbidity or mortality     hx of bronchitis    Rash, skin 09/25/2012    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 Patient largely mod I with ADLs and functional mobility at baseline. Patient has all needed DME.     Home Situation  Home Environment: Private residence  One/Two Story Residence: Two story  Living Alone: No  Support Systems: Family member(s), Church / faith community  Patient Expects to be Discharged to:: Private residence  Current DME Used/Available at Home: Gilmer Morane, straight, Environmental consultantWalker, rolling, Paediatric nursehower chair, Corporate investment bankerGrab bars  Tub or Shower Type: Advice workerhower    Hand dominance: Right    EXAMINATION OF PERFORMANCE DEFICITS:  Cognitive/Behavioral Status:  Neurologic State: Alert  Orientation Level: Oriented X4  Cognition: Appropriate for age attention/concentration;Follows commands  Perception: Appears intact  Perseveration: No perseveration noted  Safety/Judgement: Awareness of environment;Fall prevention    Skin: Appears grossly intact    Edema: none noted in BUEs    Hearing:  Auditory  Auditory Impairment: None    Vision/Perceptual:    Tracking: Able to track stimulus in all quadrants w/o difficulty    Diplopia: No    Acuity: Within Defined Limits    Corrective Lenses: Reading glasses  Range of Motion:  In BUEs  AROM: Generally decreased, functional(2* baseline arthritic restriction)  Strength:  In BUEs  Strength: Generally decreased, functional  Coordination:  Coordination: Within functional limits  Fine Motor Skills-Upper: Left Intact;Right Intact     Gross Motor Skills-Upper: Left Intact;Right Intact  Tone & Sensation:  In BUEs  Tone: Normal  Sensation: Intact  Balance:  Sitting: Intact;Without support  Standing: Intact;With support  Functional Mobility and Transfers for ADLs:  Bed Mobility:  Rolling: (NT - recieved in chair and returned to chair)    Transfers:  Sit to Stand: Supervision;Adaptive equipment(RW)  Stand to Sit: Supervision;Adaptive equipment(RW)  Toilet Transfer : Supervision;Adaptive equipment(grab bar and RW)    ADL Assessment:  Feeding: Independent(Infer per obs of func reach, BUE ROM, FM/GM coordination)    Oral Facial Hygiene/Grooming: Independent(Infer per obs of func reach, BUE ROM, FM/GM coordination)    Bathing: Independent(IND sitting at baseline)    Upper Body Dressing: Independent(Infer per obs of func reach, BUE ROM, FM/GM coordination)    Lower Body Dressing: Independent(Infer per obs of func mob, balance, func reach)  Toileting: Supervision  ADL Intervention and task modifications:  Grooming  Washing Hands: Independent(standing at sink )    Toileting  Toileting Assistance: Supervision  Bladder Hygiene: Supervision/set-up  Bowel Hygiene: Supervision/set-up  Clothing Management: Supervision/set-up  Adaptive Equipment: Grab bars;Walker    Cognitive Retraining  Safety/Judgement: Awareness of environment;Fall prevention  Functional Measure:  Barthel Index:  Bathing: 5  Bladder: 10  Bowels: 10  Grooming: 5  Dressing: 10  Feeding: 10  Mobility: 10  Stairs: 0  Toilet Use: 10  Transfer (Bed to Chair and Back): 10  Total: 80/100       Percentage of impairment   0%   1-19%   20-39%   40-59%   60-79%   80-99%   100%   Barthel Score 0-100 100 99-80 79-60 59-40 20-39 1-19   0     The Barthel ADL Index: Guidelines  1. The index should be used as a record of what a patient does, not as a record of what a patient could do.  2. The main aim is to establish degree of independence from any help,  physical or verbal, however minor and for whatever reason.  3. The need for supervision renders the patient not independent.  4. A patient's performance should be established using the best available evidence. Asking the patient, friends/relatives and nurses are the usual sources, but direct observation and common sense are also important. However direct testing is not needed.  5. Usually the patient's performance over the preceding 24-48 hours is important, but occasionally longer periods will be relevant.  6. Middle categories imply that the patient supplies over 50 per cent of the effort.  7. Use of aids to be independent is allowed.    Clarisa Kindred., Barthel, D.W. [phone]). Functional evaluation: the Barthel Index. Md State Med J (14)2.  Zenaida Niece der [place], J.J.M.F, [place], Ian Malkin., Margret Chance., [place], [place]. (1999). Measuring the change indisability after inpatient rehabilitation; comparison of the responsiveness of the Barthel Index and Functional Independence Measure. Journal of Neurology, Neurosurgery, and Psychiatry, 66(4), [phone].  Dawson Bills, N.J.A, [place],  W.J.M, & Koopmanschap, M.A. (2004.) Assessment of post-stroke quality of life in cost-effectiveness studies: The usefulness of the Barthel Index and the EuroQoL-5D. Quality of Life Research, 59, 478-29       Occupational Therapy Evaluation Charge Determination   History Examination Decision-Making   LOW Complexity : Brief history review  LOW Complexity : 1-3 performance deficits relating to physical, cognitive , or psychosocial skils that result in activity limitations and / or participation restrictions  LOW Complexity : No comorbidities that affect functional and no verbal or physical assistance needed to complete eval tasks       Based on the above components, the patient evaluation is determined to be of the following complexity level: LOW   Pain:  Pre treatment: 0 /10   During treatment: 0/10  Post treatment:  0/10   Location: none     Description:none    Aggravating factors:     Activity Tolerance:   Good and requires rest breaks  Please refer to the flowsheet for vital signs taken during this treatment.    After treatment patient left:   Up in chair  Call light within reach  RN notified   COMMUNICATION/EDUCATION:   The patient?s plan of care was discussed with: Physical Therapist and Registered Nurse.    Home safety education was provided and the patient/caregiver indicated understanding. and Patient/family have participated as able in goal setting  and plan of care.    This patient?s plan of care is appropriate for delegation to OTA.    Thank you for this referral.  Evalina Field, OT  Time Calculation: 16 mins

## 2017-06-12 NOTE — Progress Notes (Signed)
Hospital Discharge Follow-Up      Date/Time:  06/12/2017 11:05 AM    Patient was admitted to [facility] on 4/4 and discharged on 4/8 for Acute Pulm Embolism. The physician discharge summary was available at the time of outreach.  Patient was contacted within 1 business days of discharge.      Top Challenges reviewed with the provider   TOC is 4/12- Eliquis has been started,low dose ASA continued; pt aware of anti-coagulation precautions.  It's suggested that pt have a sleep study soon & age approp CA screening.   HH is via [facility]       Method of communication with provider :chart routing    Inpatient RRAT score: 29  Was this a readmission? no   Patient stated reason for the readmission: n/a    Nurse Navigator (NN) contacted the patient by telephone to perform post hospital discharge assessment. Verified name and DOB with patient as identifiers. Provided introduction to self, and explanation of the Nurse Navigator role.     Reviewed discharge instructions and red flags with patient who verbalized understanding. Patient given an opportunity to ask questions and does not have any further questions or concerns at this time. The patient agrees to contact the PCP office for questions related to their healthcare. NN provided contact information for future reference.    Disease Specific:   COPD    Summary of patient's top problems:  1. Pulm Embolism- hypoxemic resp. failure, hx of right apical lung nodule, SOB much improved, using IS at home, started on Eliquis    2. CHF- hx of inferior wall defect, EF on 4/5 61-65%, Pro BNP 1164 (mildly elevated 900 cuttoff to age 65 then cuttoff is 1800 at age 40),Troponin neg x 2, BMI of 52    3.     Home Health orders at discharge: PT, OT, SN  Home Health company: [facility]  Date of initial visit: TBD- has made contact with the pt    Durable Medical Equipment ordered/company: n/a  Durable Medical Equipment received: n/a     Barriers to care? None identified at this time    Advance Care Planning:   Does patient have an Advance Directive:  not on file; education provided     Medication(s):   New Medications at Discharge: Eliquis 5mg   Changed Medications at Discharge: n/a  Discontinued Medications at Discharge: n/a    Medication reconciliation was performed with patient, who verbalizes understanding of administration of home medications.  There were no barriers to obtaining medications identified at this time.    Referral to Pharm D needed: no     Current Outpatient Medications   Medication Sig   ??? apixaban (ELIQUIS) 5 mg tablet Take 10 mg (2 tabs) Orally twice a day till 06/16/17, and then start 5 mg (1 one tab) twice a day from 06/17/17   ??? lisinopril (PRINIVIL, ZESTRIL) 10 mg tablet Take 1 Tab by mouth daily.   ??? metFORMIN (GLUCOPHAGE) 1,000 mg tablet Take 1 Tab by mouth two (2) times daily (with meals).   ??? methIMAzole (TAPAZOLE) 5 mg tablet Take 1 Tab by mouth daily.   ??? metoprolol tartrate (LOPRESSOR) 50 mg tablet Take 1 Tab by mouth two (2) times a day.   ??? rosuvastatin (CRESTOR) 20 mg tablet Take 1 Tab by mouth nightly.   ??? triamterene-hydroCHLOROthiazide (DYAZIDE) 37.5-25 mg per capsule TAKE 1 CAPSULE BY MOUTH EVERY DAY   ??? BREO ELLIPTA 200-25 mcg/dose inhaler INHALE  1 PUFF BY MOUTH DAILY   ??? glucose blood VI test strips (BLOOD GLUCOSE TEST) strip Use BID DxE11.9   ??? lancets misc Use BID. Dx: E11.9   ??? nystatin (MYCOSTATIN) powder Apply  to affected area four (4) times daily.   ??? CALCIUM 600 + D tablet take 1 tablet by mouth twice a day   ??? aspirin delayed-release 81 mg tablet Take 1 Tab by mouth daily.   ??? oxyCODONE-acetaminophen (PERCOCET 10) 10-325 mg per tablet take 1/2 TO 1 tablet once daily if needed for pain   ??? aspirin-calcium carbonate 81 mg-300 mg calcium(777 mg) tab Take 81 mg by mouth.   ??? JANUMET 50-500 mg per tablet take 1 tablet by mouth once daily   ??? FREESTYLE LANCETS 28 gauge misc     ??? cyanocobalamin 1,000 mcg tablet Take 1,000 mcg by mouth daily.     No current facility-administered medications for this visit.        There are no discontinued medications.    [facility] follow up appointment(s):   Future Appointments   Date Time Provider Department Center   06/15/2017  9:00 AM Barbarann EhlersElliott[facility]   07/18/2017  8:00 AM ECHO, [facility] [place] [facility]   07/18/2017  9:40 AM Mathews RobinsonsSabharwal, Vipal, MD [facility] [place] SCHED   08/28/2017 10:45 AM Barbarann EhlersElliott, Charmaine N, MD [facility] SCHED      Non-[facility] follow up appointment(s): n/a  [facility]:  Pt is aware of service- has used twice in the past.       Goals     None

## 2017-06-15 ENCOUNTER — Encounter: Attending: Internal Medicine | Primary: Geriatric Medicine

## 2017-06-21 NOTE — Progress Notes (Signed)
This Probation officer reaches out to Ms Antonetti as a follow up to our last conversation. Pt tells this Probation officer she is feeling good. Denies shortness of breath, has no smokers around her. Pt is observant of her breathing habits and isn't noticing any change as was before admission.  Pt reports having met and canceled HH - confirmed w/ [facility].   Pt continues Eliquis without any side effects or bleedings.  Pt is reminded of her 4/23 with PCP  Cardiology f/u of 5/15 pt confirms

## 2017-06-26 ENCOUNTER — Ambulatory Visit: Attending: Internal Medicine | Primary: Geriatric Medicine

## 2017-06-26 ENCOUNTER — Ambulatory Visit: Admit: 2017-06-26 | Discharge: 2017-06-26 | Payer: MEDICARE | Attending: Internal Medicine | Primary: Geriatric Medicine

## 2017-06-26 DIAGNOSIS — IMO0001 Reserved for inherently not codable concepts without codable children: Secondary | ICD-10-CM

## 2017-06-26 DIAGNOSIS — Z9189 Other specified personal risk factors, not elsewhere classified: Secondary | ICD-10-CM

## 2017-06-26 MED ORDER — APIXABAN 5 MG TABLET
5 mg | ORAL_TABLET | Freq: Two times a day (BID) | ORAL | 1 refills | Status: DC
Start: 2017-06-26 — End: 2017-09-02

## 2017-06-26 MED ORDER — APIXABAN 5 MG TABLET
5 mg | ORAL_TABLET | Freq: Two times a day (BID) | ORAL | 1 refills | Status: DC
Start: 2017-06-26 — End: 2017-06-26

## 2017-06-26 NOTE — Progress Notes (Signed)
Chief Complaint   Patient presents with   ??? Transitions Of Care     06[facility]07[facility]2017[facility]     1. Have you been to the ER, urgent care clinic since your last visit?  Hospitalized since your last visit?Yes When: [facility]    2. Have you seen or consulted any other health care providers [facility] since your last [facility]?  Include any pap smears or colon screening. No

## 2017-06-26 NOTE — Progress Notes (Signed)
Allison Newman is a 77 y.o. female and presents with Transitions Of Care (06/07/2017-06/11/2017 [facility] Acute Pulmonary Embol[facility])  .  Subjective:  Pt comes-in for hospital d/c f/u.  Pt was admitted to [facility] 4/419-06/11/17 after presenting w SOB.  Pt was found to have bil PE and started on eliquis.  Pts only RF is her morbid obesity and sedentary lifestyle.    It was also suggested that pt have a sleep study done.    Morbid obesity-  Wt Readings from Last 3 Encounters:   06/26/17 312 lb (141.5 kg)   06/11/17 307 lb 5.1 oz (139.4 kg)   04/24/17 317 lb (143.8 kg)     CKD 2-  Lab Results   Component Value Date/Time    GFR est AA >60 06/11/2017 05:11 AM    GFR est non-AA 56 (L) 06/11/2017 05:11 AM    Creatinine (POC) 0.8 04/11/2013 11:28 AM    Creatinine 0.97 06/11/2017 05:11 AM    BUN 27 (H) 06/11/2017 05:11 AM    Sodium 134 (L) 06/11/2017 05:11 AM    Potassium 4.2 06/11/2017 05:11 AM    Chloride 103 06/11/2017 05:11 AM    CO2 24 06/11/2017 05:11 AM     Type 2 DM-controlled  Lab Results   Component Value Date/Time    Hemoglobin A1c 7.7 (H) 06/08/2017 04:05 AM    Hemoglobin A1c (POC) 7.2 04/24/2017 11:52 AM    Hemoglobin A1c, External 6.6 07/29/2014     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 [facility]    OSA/COPD/goiter/hyperlipidemia  Lab Results   Component Value Date/Time    Cholesterol, total 116 06/08/2017 04:00 AM    Cholesterol (POC) 131 12/21/2016 11:56 AM    HDL Cholesterol 50 06/08/2017 04:00 AM    HDL Cholesterol (POC) 48 12/21/2016 11:56 AM    LDL Cholesterol (POC) 66 12/21/2016 11:56 AM    LDL, calculated 46.2 06/08/2017 04:00 AM    VLDL, calculated 19.8 06/08/2017 04:00 AM    Triglyceride 99 06/08/2017 04:00 AM    Triglycerides (POC) 85 12/21/2016 11:56 AM    CHOL/HDL Ratio 2.3 06/08/2017 04:00 AM       Review of Systems  Review of systems (12) negative, except noted above.      Past Medical History:   Diagnosis Date   ??? Abdominal pain 05/08/2013   ??? Abnormal finding on EKG 11/17/2013    ??? ACP (advance care planning) 05/03/2015   ??? Arthritis    ??? 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 Chronic pain of left knee 08/13/2014   ??? Chronic right hip pain 03/17/2013   ??? 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 Diabetes ([place])    ??? Elevated BUN 09/13/2011   ??? Finger lesion 03/02/2016   ??? Gallbladder polyp 04/08/2013   ??? Gastrointestinal disorder     GERD   ??? GERD (gastroesophageal reflux disease)    ??? Hypercholesterolemia    ??? Hypertension    ??? 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 Microalbuminuria 08/29/2013   ??? 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, adult ([place]) 08/28/2011   ??? 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 On aspirin at home 05/03/2015   ??? Other unknown and unspecified cause of morbidity or mortality     hx of bronchitis   ??? Rash, skin 09/25/2012   ??? 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 Slow transit constipation 04/14/5282   ??? Umbilical hernia 03/08/2438     Past Surgical History:   Procedure Laterality Date   ??? 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 HX ORTHOPAEDIC  2000    screw in foot left   ??? HX OTHER SURGICAL      colonoscopy   ??? HX TUBAL LIGATION       Social History     Socioeconomic History   ??? Marital status: DIVORCED     Spouse name: Not on file   ??? Number of children: Not on file   ??? Years of education: Not on file   ??? Highest education level: Not on file   Tobacco Use   ??? Smoking status: Former Smoker     Packs/day: 0.25     Years: 1.00     Pack years: 0.25     Types: Cigarettes     Last attempt to quit: 06/03/1991     Years since quitting: 26.0   ??? Smokeless tobacco: Never Used   Substance and Sexual Activity   ??? Alcohol use: No     Alcohol/week: 0.0 oz   ??? Drug use: No   ??? Sexual activity: Never     Family History   Problem Relation Age of Onset   ??? Diabetes Mother    ??? Heart Disease Mother         rheumatic fever    ??? Breast Cancer Mother    ??? Cancer Father         lung   ??? Cancer Sister         colon & breast   ??? Breast Cancer Sister    ??? Cancer Brother         throat     Current Outpatient Medications   Medication Sig Dispense Refill   ??? apixaban (ELIQUIS) 5 mg tablet Take 1 Tab by mouth two (2) times a day. 60 Tab 1   ??? lisinopril (PRINIVIL, ZESTRIL) 10 mg tablet Take 1 Tab by mouth daily. 90 Tab 1   ??? metFORMIN (GLUCOPHAGE) 1,000 mg tablet Take 1 Tab by mouth two (2) times daily (with meals). 180 Tab 1   ??? methIMAzole (TAPAZOLE) 5 mg tablet Take 1 Tab by mouth daily. 90 Tab 1   ??? metoprolol tartrate (LOPRESSOR) 50 mg tablet Take 1 Tab by mouth two (2) times a day. 180 Tab 1   ??? rosuvastatin (CRESTOR) 20 mg tablet Take 1 Tab by mouth nightly. 90 Tab 1   ??? triamterene-hydroCHLOROthiazide (DYAZIDE) 37.5-25 mg per capsule TAKE 1 CAPSULE BY MOUTH EVERY DAY 90 Cap 1   ??? BREO ELLIPTA 200-25 mcg/dose inhaler INHALE 1 PUFF BY MOUTH DAILY 1 Each 5   ??? glucose blood VI test strips (BLOOD GLUCOSE TEST) strip Use BID DxE11.9 100 Strip 11   ??? lancets misc Use BID. Dx: E11.9 100 Each 11   ??? nystatin (MYCOSTATIN) powder Apply  to affected area four (4) times daily. 120 g 3   ??? CALCIUM 600 + D tablet take 1 tablet by mouth twice a day 60 Tab 11   ??? aspirin delayed-release 81 mg tablet Take 1 Tab by mouth daily. 30 Tab 11   ??? oxyCODONE-acetaminophen (PERCOCET 10) 10-325 mg per tablet take 1/2 TO 1 tablet once daily if needed for pain  0   ??? FREESTYLE LANCETS 28 gauge misc   1   ???  aspirin-calcium carbonate 81 mg-300 mg calcium(777 mg) tab Take 81 mg by mouth.     ??? JANUMET 50-500 mg per tablet take 1 tablet by mouth once daily 30 Tab 1   ??? cyanocobalamin 1,000 mcg tablet Take 1,000 mcg by mouth daily.       Allergies   Allergen Reactions   ??? Almond Oil Anaphylaxis, Hives and Itching       Objective:  Visit Vitals  BP 140/63 (BP 1 Location: Left arm, BP Patient Position: Sitting)   Pulse 69   Temp 98.6 ??F (37 ??C) (Oral)   Resp 20    Ht '5\' 4"'$  (1.626 m)   Wt 312 lb (141.5 kg)   LMP  (LMP Unknown)   SpO2 93%   BMI 53.55 kg/m??     Physical Exam:   General appearance - alert, obese  Mental status - alert, oriented to person, place, and time  EYE-EOMI  Neck - supple, no significant adenopathy   Chest - clear to auscultation, distant BS  Heart - normal rate, regular rhythm, normal S1, S2, 2/6 systolic murmur  Abdomen - soft, nontender, nondistended, no masses or organomegaly  Ext-peripheral pulses normal, no pedal edema, no clubbing or cyanosis  Skin-erythematous, well demarcated, moist rash under left breast  Neuro -alert, oriented, normal speech, walks w walker, VERY SLOWLY    Results for orders placed or performed during the hospital encounter of 06/07/17   SAMPLES BEING HELD   Result Value Ref Range    SAMPLES BEING HELD 1PST 1BLU 1RED 1LAV     COMMENT        Add-on orders for these samples will be processed based on acceptable specimen integrity and analyte stability, which may vary by analyte.   CBC WITH AUTOMATED DIFF   Result Value Ref Range    WBC 7.3 3.6 - 11.0 K/uL    RBC 4.54 3.80 - 5.20 M/uL    HGB 13.1 11.5 - 16.0 g/dL    HCT 42.2 35.0 - 47.0 %    MCV 93.0 80.0 - 99.0 FL    MCH 28.9 26.0 - 34.0 PG    MCHC 31.0 30.0 - 36.5 g/dL    RDW 14.1 11.5 - 14.5 %    PLATELET 137 (L) 150 - 400 K/uL    MPV 12.6 8.9 - 12.9 FL    NRBC 0.0 0 PER 100 WBC    ABSOLUTE NRBC 0.00 0.00 - 0.01 K/uL    NEUTROPHILS 64 32 - 75 %    LYMPHOCYTES 23 12 - 49 %    MONOCYTES 9 5 - 13 %    EOSINOPHILS 3 0 - 7 %    BASOPHILS 1 0 - 1 %    IMMATURE GRANULOCYTES 0 0.0 - 0.5 %    ABS. NEUTROPHILS 4.7 1.8 - 8.0 K/UL    ABS. LYMPHOCYTES 1.7 0.8 - 3.5 K/UL    ABS. MONOCYTES 0.7 0.0 - 1.0 K/UL    ABS. EOSINOPHILS 0.2 0.0 - 0.4 K/UL    ABS. BASOPHILS 0.0 0.0 - 0.1 K/UL    ABS. IMM. GRANS. 0.0 0.00 - 0.04 K/UL    DF AUTOMATED     METABOLIC PANEL, BASIC   Result Value Ref Range    Sodium 142 136 - 145 mmol/L    Potassium 4.0 3.5 - 5.1 mmol/L    Chloride 109 (H) 97 - 108 mmol/L     CO2 26 21 - 32 mmol/L    Anion gap 7 5 -  15 mmol/L    Glucose 161 (H) 65 - 100 mg/dL    BUN 24 (H) 6 - 20 MG/DL    Creatinine 0.97 0.55 - 1.02 MG/DL    BUN/Creatinine ratio 25 (H) 12 - 20      GFR est AA >60 >60 ml/min/1.69m    GFR est non-AA 56 (L) >60 ml/min/1.734m   Calcium 9.4 8.5 - 10.1 MG/DL   NT-PRO BNP   Result Value Ref Range    NT pro-BNP 1,164 (H) <125 PG/ML   TROPONIN I   Result Value Ref Range    Troponin-I, Qt. <0.05 <0<1.61g/mL   METABOLIC PANEL, COMPREHENSIVE   Result Value Ref Range    Sodium 139 136 - 145 mmol/L    Potassium 3.7 3.5 - 5.1 mmol/L    Chloride 107 97 - 108 mmol/L    CO2 24 21 - 32 mmol/L    Anion gap 8 5 - 15 mmol/L    Glucose 219 (H) 65 - 100 mg/dL    BUN 22 (H) 6 - 20 MG/DL    Creatinine 0.92 0.55 - 1.02 MG/DL    BUN/Creatinine ratio 24 (H) 12 - 20      GFR est AA >60 >60 ml/min/1.7320m  GFR est non-AA 60 (L) >60 ml/min/1.46m46m Calcium 9.1 8.5 - 10.1 MG/DL    Bilirubin, total 0.6 0.2 - 1.0 MG/DL    ALT (SGPT) 13 12 - 78 U/L    AST (SGOT) 14 (L) 15 - 37 U/L    Alk. phosphatase 63 45 - 117 U/L    Protein, total 7.1 6.4 - 8.2 g/dL    Albumin 3.0 (L) 3.5 - 5.0 g/dL    Globulin 4.1 (H) 2.0 - 4.0 g/dL    A-G Ratio 0.7 (L) 1.1 - 2.2     LIPID PANEL   Result Value Ref Range    LIPID PROFILE          Cholesterol, total 116 <200 MG/DL    Triglyceride 99 <150 MG/DL    HDL Cholesterol 50 MG/DL    LDL, calculated 46.2 0 - 100 MG/DL    VLDL, calculated 19.8 MG/DL    CHOL/HDL Ratio 2.3 0.0 - 5.0     CBC WITH AUTOMATED DIFF   Result Value Ref Range    WBC 6.6 3.6 - 11.0 K/uL    RBC 4.44 3.80 - 5.20 M/uL    HGB 12.7 11.5 - 16.0 g/dL    HCT 41.0 35.0 - 47.0 %    MCV 92.3 80.0 - 99.0 FL    MCH 28.6 26.0 - 34.0 PG    MCHC 31.0 30.0 - 36.5 g/dL    RDW 14.0 11.5 - 14.5 %    PLATELET 128 (L) 150 - 400 K/uL    MPV 12.1 8.9 - 12.9 FL    NRBC 0.0 0 PER 100 WBC    ABSOLUTE NRBC 0.00 0.00 - 0.01 K/uL    NEUTROPHILS 59 32 - 75 %    LYMPHOCYTES 27 12 - 49 %    MONOCYTES 9 5 - 13 %     EOSINOPHILS 4 0 - 7 %    BASOPHILS 1 0 - 1 %    IMMATURE GRANULOCYTES 0 0.0 - 0.5 %    ABS. NEUTROPHILS 3.9 1.8 - 8.0 K/UL    ABS. LYMPHOCYTES 1.8 0.8 - 3.5 K/UL    ABS. MONOCYTES 0.6 0.0 - 1.0 K/UL    ABS.  EOSINOPHILS 0.2 0.0 - 0.4 K/UL    ABS. BASOPHILS 0.0 0.0 - 0.1 K/UL    ABS. IMM. GRANS. 0.0 0.00 - 0.04 K/UL    DF AUTOMATED     TSH 3RD GENERATION   Result Value Ref Range    TSH 1.33 0.36 - 3.74 uIU/mL   MAGNESIUM   Result Value Ref Range    Magnesium 1.9 1.6 - 2.4 mg/dL   PHOSPHORUS   Result Value Ref Range    Phosphorus 3.8 2.6 - 4.7 MG/DL   TROPONIN I   Result Value Ref Range    Troponin-I, Qt. <0.05 <0.05 ng/mL   CK W/ CKMB & INDEX   Result Value Ref Range    CK 115 26 - 192 U/L    CK - MB 2.1 <3.6 NG/ML    CK-MB Index 1.8 0.0 - 2.5     HEMOGLOBIN A1C WITH EAG   Result Value Ref Range    Hemoglobin A1c 7.7 (H) 4.2 - 6.3 %    Est. average glucose 174 mg/dL   CBC WITH AUTOMATED DIFF   Result Value Ref Range    WBC 6.8 3.6 - 11.0 K/uL    RBC 4.52 3.80 - 5.20 M/uL    HGB 12.9 11.5 - 16.0 g/dL    HCT 41.9 35.0 - 47.0 %    MCV 92.7 80.0 - 99.0 FL    MCH 28.5 26.0 - 34.0 PG    MCHC 30.8 30.0 - 36.5 g/dL    RDW 14.1 11.5 - 14.5 %    PLATELET 130 (L) 150 - 400 K/uL    MPV 12.5 8.9 - 12.9 FL    NRBC 0.0 0 PER 100 WBC    ABSOLUTE NRBC 0.00 0.00 - 0.01 K/uL    NEUTROPHILS 53 32 - 75 %    LYMPHOCYTES 32 12 - 49 %    MONOCYTES 11 5 - 13 %    EOSINOPHILS 3 0 - 7 %    BASOPHILS 1 0 - 1 %    IMMATURE GRANULOCYTES 0 0.0 - 0.5 %    ABS. NEUTROPHILS 3.7 1.8 - 8.0 K/UL    ABS. LYMPHOCYTES 2.2 0.8 - 3.5 K/UL    ABS. MONOCYTES 0.7 0.0 - 1.0 K/UL    ABS. EOSINOPHILS 0.2 0.0 - 0.4 K/UL    ABS. BASOPHILS 0.0 0.0 - 0.1 K/UL    ABS. IMM. GRANS. 0.0 0.00 - 0.04 K/UL    DF AUTOMATED     METABOLIC PANEL, BASIC   Result Value Ref Range    Sodium 135 (L) 136 - 145 mmol/L    Potassium 3.8 3.5 - 5.1 mmol/L    Chloride 103 97 - 108 mmol/L    CO2 27 21 - 32 mmol/L    Anion gap 5 5 - 15 mmol/L    Glucose 178 (H) 65 - 100 mg/dL     BUN 26 (H) 6 - 20 MG/DL    Creatinine 0.95 0.55 - 1.02 MG/DL    BUN/Creatinine ratio 27 (H) 12 - 20      GFR est AA >60 >60 ml/min/1.86m    GFR est non-AA 58 (L) >60 ml/min/1.728m   Calcium 8.7 8.5 - 10.1 MG/DL   T4, FREE   Result Value Ref Range    T4, Free 1.2 0.8 - 1.5 NG/DL   CBC WITH AUTOMATED DIFF   Result Value Ref Range    WBC 6.2 3.6 - 11.0 K/uL    RBC 4.32  3.80 - 5.20 M/uL    HGB 12.7 11.5 - 16.0 g/dL    HCT 39.6 35.0 - 47.0 %    MCV 91.7 80.0 - 99.0 FL    MCH 29.4 26.0 - 34.0 PG    MCHC 32.1 30.0 - 36.5 g/dL    RDW 13.6 11.5 - 14.5 %    PLATELET 132 (L) 150 - 400 K/uL    MPV 12.6 8.9 - 12.9 FL    NRBC 0.0 0 PER 100 WBC    ABSOLUTE NRBC 0.00 0.00 - 0.01 K/uL    NEUTROPHILS 55 32 - 75 %    LYMPHOCYTES 30 12 - 49 %    MONOCYTES 10 5 - 13 %    EOSINOPHILS 4 0 - 7 %    BASOPHILS 1 0 - 1 %    IMMATURE GRANULOCYTES 0 0.0 - 0.5 %    ABS. NEUTROPHILS 3.4 1.8 - 8.0 K/UL    ABS. LYMPHOCYTES 1.9 0.8 - 3.5 K/UL    ABS. MONOCYTES 0.6 0.0 - 1.0 K/UL    ABS. EOSINOPHILS 0.3 0.0 - 0.4 K/UL    ABS. BASOPHILS 0.0 0.0 - 0.1 K/UL    ABS. IMM. GRANS. 0.0 0.00 - 0.04 K/UL    DF AUTOMATED     METABOLIC PANEL, BASIC   Result Value Ref Range    Sodium 135 (L) 136 - 145 mmol/L    Potassium 3.7 3.5 - 5.1 mmol/L    Chloride 103 97 - 108 mmol/L    CO2 26 21 - 32 mmol/L    Anion gap 6 5 - 15 mmol/L    Glucose 188 (H) 65 - 100 mg/dL    BUN 23 (H) 6 - 20 MG/DL    Creatinine 0.81 0.55 - 1.02 MG/DL    BUN/Creatinine ratio 28 (H) 12 - 20      GFR est AA >60 >60 ml/min/1.44m    GFR est non-AA >60 >60 ml/min/1.783m   Calcium 8.7 8.5 - 10.1 MG/DL   CBC WITH AUTOMATED DIFF   Result Value Ref Range    WBC 5.8 3.6 - 11.0 K/uL    RBC 4.51 3.80 - 5.20 M/uL    HGB 13.2 11.5 - 16.0 g/dL    HCT 41.5 35.0 - 47.0 %    MCV 92.0 80.0 - 99.0 FL    MCH 29.3 26.0 - 34.0 PG    MCHC 31.8 30.0 - 36.5 g/dL    RDW 13.7 11.5 - 14.5 %    PLATELET 144 (L) 150 - 400 K/uL    MPV 12.1 8.9 - 12.9 FL    NRBC 0.0 0 PER 100 WBC    ABSOLUTE NRBC 0.00 0.00 - 0.01 K/uL     NEUTROPHILS 53 32 - 75 %    LYMPHOCYTES 32 12 - 49 %    MONOCYTES 11 5 - 13 %    EOSINOPHILS 4 0 - 7 %    BASOPHILS 0 0 - 1 %    IMMATURE GRANULOCYTES 0 0.0 - 0.5 %    ABS. NEUTROPHILS 3.0 1.8 - 8.0 K/UL    ABS. LYMPHOCYTES 1.9 0.8 - 3.5 K/UL    ABS. MONOCYTES 0.7 0.0 - 1.0 K/UL    ABS. EOSINOPHILS 0.3 0.0 - 0.4 K/UL    ABS. BASOPHILS 0.0 0.0 - 0.1 K/UL    ABS. IMM. GRANS. 0.0 0.00 - 0.04 K/UL    DF AUTOMATED     METABOLIC PANEL, BASIC   Result Value  Ref Range    Sodium 134 (L) 136 - 145 mmol/L    Potassium 4.2 3.5 - 5.1 mmol/L    Chloride 103 97 - 108 mmol/L    CO2 24 21 - 32 mmol/L    Anion gap 7 5 - 15 mmol/L    Glucose 155 (H) 65 - 100 mg/dL    BUN 27 (H) 6 - 20 MG/DL    Creatinine 0.97 0.55 - 1.02 MG/DL    BUN/Creatinine ratio 28 (H) 12 - 20      GFR est AA >60 >60 ml/min/1.31m    GFR est non-AA 56 (L) >60 ml/min/1.727m   Calcium 9.1 8.5 - 10.1 MG/DL   GLUCOSE, POC   Result Value Ref Range    Glucose (POC) 181 (H) 65 - 100 mg/dL    Performed by LUNA CYNTHIA    GLUCOSE, POC   Result Value Ref Range    Glucose (POC) 179 (H) 65 - 100 mg/dL    Performed by PROTHEROE JOMartinique  GLUCOSE, POC   Result Value Ref Range    Glucose (POC) 169 (H) 65 - 100 mg/dL    Performed by FEDolores Frame  GLUCOSE, POC   Result Value Ref Range    Glucose (POC) 219 (H) 65 - 100 mg/dL    Performed by FEDolores Frame  GLUCOSE, POC   Result Value Ref Range    Glucose (POC) 182 (H) 65 - 100 mg/dL    Performed by Richardson InNiger PCT    GLUCOSE, POC   Result Value Ref Range    Glucose (POC) 189 (H) 65 - 100 mg/dL    Performed by [employer]  GLUCOSE, POC   Result Value Ref Range    Glucose (POC) 162 (H) 65 - 100 mg/dL    Performed by Unkown Operator    GLUCOSE, POC   Result Value Ref Range    Glucose (POC) 190 (H) 65 - 100 mg/dL    Performed by WiFayrene Fearing  GLUCOSE, POC   Result Value Ref Range    Glucose (POC) 174 (H) 65 - 100 mg/dL    Performed by WiFayrene Fearing  GLUCOSE, POC    Result Value Ref Range    Glucose (POC) 168 (H) 65 - 100 mg/dL    Performed by HeLorne Skeens  GLUCOSE, POC   Result Value Ref Range    Glucose (POC) 164 (H) 65 - 100 mg/dL    Performed by EdOletta Lamasntionette    GLUCOSE, POC   Result Value Ref Range    Glucose (POC) 163 (H) 65 - 100 mg/dL    Performed by AlGlade Lloyd  GLUCOSE, POC   Result Value Ref Range    Glucose (POC) 159 (H) 65 - 100 mg/dL    Performed by AlGlade Lloyd  GLUCOSE, POC   Result Value Ref Range    Glucose (POC) 187 (H) 65 - 100 mg/dL    Performed by PROTHEROE JOMartinique  EKG, 12 LEAD, INITIAL   Result Value Ref Range    Ventricular Rate 79 BPM    Atrial Rate 79 BPM    P-R Interval 170 ms    QRS Duration 86 ms    Q-T Interval 372 ms    QTC Calculation (Bezet) 426 ms    Calculated P Axis 52 degrees    Calculated R Axis  25 degrees    Calculated T Axis -38 degrees    Diagnosis       Normal sinus rhythm  T wave abnormality, consider inferior ischemia  T wave abnormality, consider anterolateral ischemia  When compared with ECG of 11-Nov-2013 13:17,  Inverted T waves have replaced nonspecific T wave abnormality in   Anterolateral leads  Confirmed by Meda Coffee, M.D., Juanda Crumble [phone]) on 06/08/2017 8:20:39 AM     ECHO ADULT COMPLETE   Result Value Ref Range    Tapse 2.11 (A) 1.5 - 2.0 cm    Ao Root D 3.33 cm    PASP 45.0 mmHg       Assessment/Plan:    ICD-10-CM ICD-9-CM    1. Transition of care performed with sharing of clinical summary Z91.89 V15.89    2. Other acute pulmonary embolism without acute cor pulmonale (HCC) I26.99 415.19 apixaban (ELIQUIS) 5 mg tablet      DISCONTINUED: apixaban (ELIQUIS) 5 mg tablet   3. Morbid obesity ([place]) E66.01 278.01    4. Snoring R06.83 786.09 REFERRAL TO SLEEP STUDIES   5. Colon cancer screening Z12.11 V76.51 REFERRAL TO GASTROENTEROLOGY     Orders Placed This Encounter   ??? Jackolyn Confer [facility]     Referral Priority:   Routine     Referral Type:   Consultation     Referral Reason:   Specialty Services Required      Referral Location:   [facility]     Referred to Provider:   Inez Pilgrim, MD     Number of Visits Requested:   1   ??? REFERRAL TO SLEEP STUDIES     Referral Priority:   Routine     Referral Type:   Consultation     Referral Reason:   Specialty Services Required     Referred to Provider:   Serafina Mitchell, MD     Requested Specialty:   Sleep Medicine     Number of Visits Requested:   1   ??? DISCONTD: apixaban (ELIQUIS) 5 mg tablet     Sig: Take 1 Tab by mouth two (2) times a day. Take 10 mg (2 tabs) Orally twice a day till 06/16/17, and then start 5 mg (1 one tab) twice a day from 06/17/17     Dispense:  60 Tab     Refill:  1   ??? apixaban (ELIQUIS) 5 mg tablet     Sig: Take 1 Tab by mouth two (2) times a day.     Dispense:  60 Tab     Refill:  1     1. Transition of care performed with sharing of clinical summary  Completed    2. Other acute pulmonary embolism without acute cor pulmonale (HCC)  Pts RF/provoking factors morbid obesity and sedentary lifestyle  Cont DOAC x 3 mths  - apixaban (ELIQUIS) 5 mg tablet; Take 1 Tab by mouth two (2) times a day.  Dispense: 60 Tab; Refill: 1    3. Morbid obesity ([place])  Encourage more activity and daily walks    4. Snoring    - REFERRAL TO SLEEP STUDIES    5. Colon cancer screening  Pt is overdue for 5 yr f/u appt  - REFERRAL TO GASTROENTEROLOGY        There are no Patient Instructions on file for this visit.   Follow-up and Dispositions    ?? Return in about 2 months (around 08/26/2017) for pulm embolism f/u.  I have reviewed with the patient details of the assessment and plan and all questions were answered. Relevent patient education was performed.The most recent lab findings were reviewed with the patient.    An After Visit Summary was printed and given to the patient.

## 2017-07-04 NOTE — Progress Notes (Signed)
This Clinical research associate reaches out to pt as a follow up to our last conversation. Pt's name, dob and addr verified as pt identifiers. 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     Acute PE: pt denies productive cough, shortness of breath.     ACP: pt was given honoring choices information folder during last ov, reports having reviewed some of it but not sure it's intent. This writer re-explains and pt is still not ready to proceed.     DM: pt is not performing daily glucose checks reports needs to take the glucometer back to [facility] for instruction. Pt denies symptoms of hypo/hyperglycemia    Cardiology: pt questions why she needs said f/u on 5/15 since she was told her heart is fine. This Clinical research associate explains what an ECHO does and the diagnosis associated with the order- Pulmonary HTN, Acute PE, SOB. Writer offers to contact said office to have nurse there call her to further explain, pt declines sighting she will attend the appointment.     Referrals: Pt was given a referral to the sleep center and gastroenterology at last ov, pt hasn't scheduled this appointments says will do so after cardiology apt. This Clinical research associate offers to do on pt's behalf, she declines.     Plan: This Clinical research associate ask that Clinical research associate call her back after 5/15 for update before resolving the episode.

## 2017-07-09 NOTE — Telephone Encounter (Signed)
Drug: Accu-Chek Aviva plus test strips 100's  Note from pharmacy: patient is having a hard time getting enough blood to test with this meter. Can we get a new rx for test strips and lancets for the truemetrix? Send new rx

## 2017-07-11 ENCOUNTER — Encounter

## 2017-07-11 MED ORDER — BLOOD SUGAR DIAGNOSTIC TEST STRIPS
ORAL_STRIP | 11 refills | Status: AC
Start: 2017-07-11 — End: ?

## 2017-07-11 MED ORDER — BLOOD GLUCOSE METER KIT
PACK | 0 refills | Status: DC
Start: 2017-07-11 — End: 2017-10-12

## 2017-07-11 NOTE — Telephone Encounter (Signed)
done

## 2017-07-17 ENCOUNTER — Encounter

## 2017-07-17 DIAGNOSIS — M961 Postlaminectomy syndrome, not elsewhere classified: Secondary | ICD-10-CM | POA: Diagnosis not present

## 2017-07-18 ENCOUNTER — Ambulatory Visit: Attending: Specialist | Primary: Geriatric Medicine

## 2017-07-18 ENCOUNTER — Ambulatory Visit: Admit: 2017-07-18 | Discharge: 2017-07-18 | Payer: MEDICARE | Primary: Geriatric Medicine

## 2017-07-18 ENCOUNTER — Ambulatory Visit: Admit: 2017-07-18 | Discharge: 2017-07-18 | Payer: MEDICARE | Attending: Specialist | Primary: Geriatric Medicine

## 2017-07-18 DIAGNOSIS — I517 Cardiomegaly: Secondary | ICD-10-CM

## 2017-07-18 DIAGNOSIS — I272 Pulmonary hypertension, unspecified: Secondary | ICD-10-CM

## 2017-07-18 LAB — ECHO ADULT COMPLETE
Aortic Root: 3.27 cm
Ascending Aorta: 3.33 cm
E/E' Lateral: 7.59
E/E' Ratio (Averaged): 7.78
E/E' Septal: 7.97
IVSd: 1.26 cm — AB (ref 0.6–0.9)
LA Area 4C: 18.5 cm2
LA Major Axis: 4.57 cm
LA Volume 4C: 49.63 mL (ref 22–52)
LA Volume Index 4C: 21 ml/m2 (ref 16–28)
LA/AO Root Ratio: 1.4
LV E' Lateral Velocity: 9.07 cm/s
LV E' Septal Velocity: 8.64 cm/s
LV Mass 2D Index: 117.6 g/m2 (ref 43–95)
LV Mass 2D: 277.8 g — AB (ref 67–162)
LVIDd: 4.84 cm (ref 3.9–5.3)
LVIDs: 3.2 cm
LVPWd: 1.23 cm — AB (ref 0.6–0.9)
MV A Velocity: 129.83 cm/s
MV Area by PHT: 2.7 cm2
MV E Velocity: 68.85 cm/s
MV E Wave Deceleration Time: 283.8 ms
MV E/A: 0.53
MV PHT: 82.3 ms
RA Area 4C: 17.28 cm2
TAPSE: 2.3 cm — AB (ref 1.5–2.0)

## 2017-07-18 LAB — TRANSTHORACIC ECHOCARDIOGRAM (TTE) COMPLETE (CONTRAST/BUBBLE/3D PRN)
Aortic Root: 3.27 cm
Ascending Aorta: 3.33 cm
E/E' Lateral: 7.59
E/E' Ratio (Averaged): 7.78
E/E' Septal: 7.97
IVSd: 1.26 cm — AB (ref 0.6–0.9)
LA Area 4C: 18.5 cm2
LA Major Axis: 4.57 cm
LA Volume 4C: 49.63 mL (ref 22–52)
LA Volume Index 4C: 21 ml/m2 (ref 16–28)
LA/AO Root Ratio: 1.4
LV E' Lateral Velocity: 9.07 cm/s
LV E' Septal Velocity: 8.64 cm/s
LV Mass 2D Index: 117.6 g/m2 (ref 43–95)
LV Mass 2D: 277.8 g — AB (ref 67–162)
LVIDd: 4.84 cm (ref 3.9–5.3)
LVIDs: 3.2 cm
LVPWd: 1.23 cm — AB (ref 0.6–0.9)
Left Ventricular Ejection Fraction: 63
MV A Velocity: 129.83 cm/s
MV Area by PHT: 2.7 cm2
MV E Velocity: 68.85 cm/s
MV E Wave Deceleration Time: 283.8 ms
MV E/A: 0.53
MV PHT: 82.3 ms
RA Area 4C: 17.28 cm2
TAPSE: 2.3 cm — AB (ref 1.5–2)

## 2017-07-18 NOTE — Patient Instructions (Signed)
You will be scheduled for a 6 month follow up.

## 2017-07-18 NOTE — Progress Notes (Signed)
Visit Vitals  BP 140/74 (BP 1 Location: Left arm, BP Patient Position: Sitting)   Pulse 77   Resp 16   Ht  (1.626 m)   Wt 312 lb (141.5 kg)   LMP  (LMP Unknown)   SpO2 93%   BMI 53.55 kg/m??

## 2017-07-18 NOTE — Progress Notes (Signed)
Allison Newman     01-19-1941       Amareon Phung K. Lateef Juncaj MD, [facility]  Date of Visit-07/18/2017   PCP is Karlyn Agee, MD   [facility]  [facility]  HPI:  Allison Newman is a 77 y.o. female   Recently in hospital with PE found to have some RV strain  Here at one month for echo fu   Echo today with normal RV size and function , pt has normal TAPSE of RV  She has baseline mild DOE due to obesity but feels back to baseline  She had noted DOE up stairs when diagnosed with the PE but that is now back to baseline  Denies chest pain, edema, syncope or shortness of breath at rest   Has no tachycardia , palpitations or sense of arrythmia    She has been on Eliiquis with no bleeding and has fu with PCP for the PE anti-coagulation    Presented April,2019:   "Acute hypoxemic respiratory failure sec to Bilateral PE, POA  O2 sats improved  NIV neg for DVT but limited studies technically.  No obvious signs/symptoms of Pneumonia, no CHF-lasix stopped  started on Eliquis 10 mg PO BID till 06/16/17, and then 62m po BID from 06/17/17- patient advised to get age appropriate cancer screening  PT/OT  Patient needs outpatient sleep studies"   -Pro BNP 1164 (mildly elevated 900 cuttoff to age [age]then cuttoff is 163at age 77  -Troponin neg x 2    CT-chest April 2019= Multiple intraluminal filling defects in pulmonary arteries consistent with pulmonary emboli & upper thoracic goiter which is displacing the trachea to the left and causing mild compression , goiter similar to 2014    07/18/17   ECHO ADULT COMPLETE 07/18/2017 07/18/2017    Narrative ?? Normal right ventricular cavity size and global RV systolic function.  ?? Left Ventricle: Mildly increased wall thickness. Estimated left   ventricular ejection fraction is 61 - 65%.  ?? Left Atrium: Mildly dilated left atrium.  ?? Aortic Valve: Mild aortic valve sclerosis.        Signed by: SCammy Copa MD         Assessment/Plan:      1. Right ventricular dilation, secondary  --Admit SOB, ProBNP really pretty close to WNL for age  Found to have PE    --this is now resolved , RV looks normal today on echo-a sign that future cor pulmonale is less likely  RV strain was due to acute PE    2. Hx of PE - fu with PCP for duration of therapy-pt wants to know more about risk-benefit, this is first PE-clot    3. Hx of abnormal stress test:  cardiovascular disease risk factors include obesity, HTN, DM2  On statin  Lab Results   Component Value Date/Time    LDL, calculated 46.2 06/08/2017 04:00 AM      -Fixed inferior defect in 2015. No reversible ischemia.    -on ASA, statin.    -troponin neg. X 2. 12 lead EKG: NSR with nospecific t wave changes.  -No active chest pain.        4 . Morbid obesity with BMI of 50.0-59.9, adult ([place]  --she asked what is be done about her risk for more PE  I discussed with her that weight loss would help reduce future risk    5. Essential hypertension-elevated suspect OSA  2015 evaluation for preop EKG ,  led to stress test and fixed defect with no angina  Since then no angina     BP Readings from Last 6 Encounters:   07/18/17 140/74   07/18/17 140/74   06/26/17 140/63   06/11/17 114/61   04/24/17 135/58   12/21/16 149/65      Key CAD CHF Meds             apixaban (ELIQUIS) 5 mg tablet (Taking) Take 1 Tab by mouth two (2) times a day.    lisinopril (PRINIVIL, ZESTRIL) 10 mg tablet (Taking) Take 1 Tab by mouth daily.    metoprolol tartrate (LOPRESSOR) 50 mg tablet (Taking) Take 1 Tab by mouth two (2) times a day.    rosuvastatin (CRESTOR) 20 mg tablet (Taking) Take 1 Tab by mouth nightly.    triamterene-hydroCHLOROthiazide (DYAZIDE) 37.5-25 mg per capsule (Taking) TAKE 1 CAPSULE BY MOUTH EVERY DAY    aspirin delayed-release 81 mg tablet (Taking) Take 1 Tab by mouth daily.    aspirin-calcium carbonate 81 mg-300 mg calcium(777 mg) tab Take 81 mg by mouth.           Future Appointments   Date Time Provider [place]    08/28/2017 10:30 AM Karlyn Agee, MD [place] in six months       ROS-except as noted above.. A complete cardiac and respiratory are reviewed and negative except as above ; Resp-denies wheezing  or productive cough,. Const- No unusual weight loss or fever; Neuro-no recent seizure or CVA ; GI- No BRBPR, abdom pain, bloating ; GU- no  hematuria   see supplement sheet, initialed and to be scanned by staff  Past Medical History:   Diagnosis Date   ??? Abdominal pain 05/08/2013   ??? Abnormal finding on EKG 11/17/2013   ??? ACP (advance care planning) 05/03/2015   ??? Acute pulmonary embolism ([place]) 06/08/2017   ??? Arthritis    ??? 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 Chronic pain of left knee 08/13/2014   ??? Chronic right hip pain 03/17/2013   ??? 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 Diabetes ([place])    ??? Elevated BUN 09/13/2011   ??? Finger lesion 03/02/2016   ??? Gallbladder polyp 04/08/2013   ??? Gastrointestinal disorder     GERD   ??? GERD (gastroesophageal reflux disease)    ??? Hypercholesterolemia    ??? Hypertension    ??? 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 Microalbuminuria 08/29/2013   ??? 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, adult ([place]) 08/28/2011   ??? 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 On aspirin at home 05/03/2015   ??? Other unknown and unspecified cause of morbidity or mortality     hx of bronchitis   ??? Rash, skin 09/25/2012   ??? 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[place]) 03/02/2016   ??? Slow transit constipation 07/07/6268   ??? Umbilical hernia 05/09/91      Social Hx= reports that she quit smoking about 26 years ago. Her smoking use included cigarettes. She has a 0.25 pack-year smoking history. She has never used smokeless tobacco. She reports that she does not drink alcohol or use drugs.     Exam and Labs:    Visit Vitals   BP 140/74 (BP 1 Location: Left arm, BP Patient Position: Sitting)   Pulse  77   Resp 16   Ht 5' 4"  (1.626 m)   Wt 312 lb (141.5 kg)   LMP  (LMP Unknown)   SpO2 93%   BMI 53.55 kg/m??    @Constitutional :  NAD, comfortable  Head: NC,AT. Eyes: No scleral icterus. Neck:  Neck supple. No JVD present.Throat: moist mucous membranes.  Chest: Effort normal & normal respiratory excursion .Neurological: alert, conversant and oriented . Skin: Skin is not cold. No obvious systemic rash noted. Not diaphoretic. No erythema. Psychiatric:  Grossly normal mood and affect.  Behavior appears normal. Extremities:  no clubbing or cyanosis. Abdomen: non distended    Lungs:breath sounds normal. No stridor. distress, wheezes or  Rales.  Heart:    normal rate, regular rhythm, normal S1, S2, no murmurs, rubs, clicks or gallops , PMI non displaced.    Edema: Edema is none.  Lab Results   Component Value Date/Time    Cholesterol, total 116 06/08/2017 04:00 AM    HDL Cholesterol 50 06/08/2017 04:00 AM    LDL, calculated 46.2 06/08/2017 04:00 AM    Triglyceride 99 06/08/2017 04:00 AM    CHOL/HDL Ratio 2.3 06/08/2017 04:00 AM     Lab Results   Component Value Date/Time    Sodium 134 (L) 06/11/2017 05:11 AM    Potassium 4.2 06/11/2017 05:11 AM    Chloride 103 06/11/2017 05:11 AM    CO2 24 06/11/2017 05:11 AM    Anion gap 7 06/11/2017 05:11 AM    Glucose 155 (H) 06/11/2017 05:11 AM    BUN 27 (H) 06/11/2017 05:11 AM    Creatinine 0.97 06/11/2017 05:11 AM    BUN/Creatinine ratio 28 (H) 06/11/2017 05:11 AM    GFR est AA >60 06/11/2017 05:11 AM    GFR est non-AA 56 (L) 06/11/2017 05:11 AM    Calcium 9.1 06/11/2017 05:11 AM      Wt Readings from Last 3 Encounters:   07/18/17 312 lb (141.5 kg)   07/18/17 312 lb (141.5 kg)   06/26/17 312 lb (141.5 kg)      BP Readings from Last 3 Encounters:   07/18/17 140/74   07/18/17 140/74   06/26/17 140/63      Current Outpatient Medications   Medication Sig    ??? Blood-Glucose Meter monitoring kit Use BID TRUEMETRIX Dx e11.9   ??? glucose blood VI test strips (BLOOD GLUCOSE TEST) strip Use BID Dx11.9 TRUEMETRIX   ??? apixaban (ELIQUIS) 5 mg tablet Take 1 Tab by mouth two (2) times a day.   ??? lisinopril (PRINIVIL, ZESTRIL) 10 mg tablet Take 1 Tab by mouth daily.   ??? metFORMIN (GLUCOPHAGE) 1,000 mg tablet Take 1 Tab by mouth two (2) times daily (with meals).   ??? methIMAzole (TAPAZOLE) 5 mg tablet Take 1 Tab by mouth daily.   ??? metoprolol tartrate (LOPRESSOR) 50 mg tablet Take 1 Tab by mouth two (2) times a day.   ??? rosuvastatin (CRESTOR) 20 mg tablet Take 1 Tab by mouth nightly.   ??? triamterene-hydroCHLOROthiazide (DYAZIDE) 37.5-25 mg per capsule TAKE 1 CAPSULE BY MOUTH EVERY DAY   ??? BREO ELLIPTA 200-25 mcg/dose inhaler INHALE 1 PUFF BY MOUTH DAILY   ??? lancets misc Use BID. Dx: E11.9   ??? nystatin (MYCOSTATIN) powder Apply  to affected area four (4) times daily.   ??? CALCIUM 600 + D tablet take 1 tablet by mouth twice a day   ??? aspirin delayed-release 81 mg tablet Take 1 Tab by mouth daily.   ???  oxyCODONE-acetaminophen (PERCOCET 10) 10-325 mg per tablet take 1/2 TO 1 tablet once daily if needed for pain   ??? FREESTYLE LANCETS 28 gauge misc    ??? aspirin-calcium carbonate 81 mg-300 mg calcium(777 mg) tab Take 81 mg by mouth.   ??? JANUMET 50-500 mg per tablet take 1 tablet by mouth once daily   ??? cyanocobalamin 1,000 mcg tablet Take 1,000 mcg by mouth daily.     No current facility-administered medications for this visit.       Impression see above.

## 2017-07-19 NOTE — Progress Notes (Signed)
Patient has graduated from the Transitions of Care Coordination  program on 5/16.  Patient's symptoms are stable at this time.  Patient/family has the ability to self-manage.   Care management goals have been completed at this time. No further nurse navigator follow up scheduled.    Goals Addressed     None          Pt has nurse navigator's contact information for any further questions, concerns, or needs.  Patients upcoming visits:    Future Appointments   Date Time Provider Department Center   08/28/2017 10:30 AM Barbarann Ehlers, MD [facility] [place] SCHED   01/30/2018  9:00 AM Mathews Robinsons, MD [facility] [place] SCHED

## 2017-07-27 DIAGNOSIS — R200 Anesthesia of skin: Secondary | ICD-10-CM | POA: Diagnosis not present

## 2017-07-31 DIAGNOSIS — M47812 Spondylosis without myelopathy or radiculopathy, cervical region: Secondary | ICD-10-CM | POA: Diagnosis not present

## 2017-07-31 DIAGNOSIS — R200 Anesthesia of skin: Secondary | ICD-10-CM | POA: Diagnosis not present

## 2017-07-31 DIAGNOSIS — M961 Postlaminectomy syndrome, not elsewhere classified: Secondary | ICD-10-CM | POA: Diagnosis not present

## 2017-07-31 DIAGNOSIS — M5023 Other cervical disc displacement, cervicothoracic region: Secondary | ICD-10-CM | POA: Diagnosis not present

## 2017-07-31 DIAGNOSIS — M5127 Other intervertebral disc displacement, lumbosacral region: Secondary | ICD-10-CM | POA: Diagnosis not present

## 2017-08-14 DIAGNOSIS — M961 Postlaminectomy syndrome, not elsewhere classified: Secondary | ICD-10-CM | POA: Diagnosis not present

## 2017-08-14 DIAGNOSIS — I10 Essential (primary) hypertension: Secondary | ICD-10-CM | POA: Diagnosis not present

## 2017-08-14 DIAGNOSIS — Z6833 Body mass index (BMI) 33.0-33.9, adult: Secondary | ICD-10-CM | POA: Diagnosis not present

## 2017-08-28 ENCOUNTER — Ambulatory Visit: Attending: Internal Medicine | Primary: Geriatric Medicine

## 2017-08-28 ENCOUNTER — Ambulatory Visit: Admit: 2017-08-28 | Discharge: 2017-08-28 | Payer: MEDICARE | Attending: Internal Medicine | Primary: Geriatric Medicine

## 2017-08-28 ENCOUNTER — Encounter: Attending: Internal Medicine | Primary: Geriatric Medicine

## 2017-08-28 DIAGNOSIS — Z0000 Encounter for general adult medical examination without abnormal findings: Secondary | ICD-10-CM

## 2017-08-28 MED ORDER — PNEUMOCOCCAL 13-VAL CONJ VACCINE-DIP CRM (PF) 0.5 ML IM SYRINGE
0.5 mL | Freq: Once | INTRAMUSCULAR | 0 refills | Status: AC
Start: 2017-08-28 — End: 2017-08-28

## 2017-08-28 MED ORDER — VARICELLA-ZOSTER GLYCOE VACC-AS01B ADJ(PF) 50 MCG/0.5 ML IM SUSPENSION
50 mcg/0.5 mL | Freq: Once | INTRAMUSCULAR | 1 refills | Status: AC
Start: 2017-08-28 — End: 2017-08-28

## 2017-08-28 NOTE — Progress Notes (Signed)
Chief Complaint   Patient presents with   ??? Follow-up     pulmonary embolism     1. Have you been to the ER, urgent care clinic since your last visit?  Hospitalized since your last visit?No    2. Have you seen or consulted any other health care providers outside of the [facility] since your last visit?  Include any pap smears or colon screening. No

## 2017-08-28 NOTE — Progress Notes (Signed)
Chief Complaint   Patient presents with   . Follow-up     pulmonary embolism     1. Have you been to the ER, urgent care clinic since your last visit?  Hospitalized since your last visit?No    2. Have you seen or consulted any other health care providers outside of the [facility] since your last visit?  Include any pap smears or colon screening. No

## 2017-08-28 NOTE — Progress Notes (Signed)
This is the Subsequent Medicare Annual Wellness Exam, performed 12 months or more after the Initial AWV or the last Subsequent AWV    I have reviewed the patient's medical history in detail and updated the computerized patient record.     History     Past Medical History:   Diagnosis Date   ??? Abdominal pain 05/08/2013   ??? Abnormal finding on EKG 11/17/2013   ??? ACP (advance care planning) 05/03/2015   ??? Acute pulmonary embolism ([place]) 06/08/2017   ??? Arthritis    ??? 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 Chronic pain of left knee 08/13/2014   ??? Chronic right hip pain 03/17/2013   ??? 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 Diabetes ([place])    ??? Elevated BUN 09/13/2011   ??? Finger lesion 03/02/2016   ??? Gallbladder polyp 04/08/2013   ??? Gastrointestinal disorder     GERD   ??? GERD (gastroesophageal reflux disease)    ??? Hypercholesterolemia    ??? Hypertension    ??? 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 Microalbuminuria 08/29/2013   ??? 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, adult ([place]) 08/28/2011   ??? 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 On aspirin at home 05/03/2015   ??? Other unknown and unspecified cause of morbidity or mortality     hx of bronchitis   ??? Rash, skin 09/25/2012   ??? 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[place]) 03/02/2016   ??? Slow transit constipation 0/11/8117   ??? Umbilical hernia 03/10/7827      Past Surgical History:   Procedure Laterality Date   ??? 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 HX ORTHOPAEDIC  2000    screw in foot left   ??? HX OTHER SURGICAL      colonoscopy   ??? HX TUBAL LIGATION       Current Outpatient Medications   Medication Sig Dispense Refill   ??? varicella-zoster recombinant, PF, (SHINGRIX) 50 mcg/0.5 mL susr injection 0.5 mL by IntraMUSCular route once for 1 dose. 0.5 mL 1   ??? pneumococcal 13 val conj dip (PREVNAR-13) 0.5 mL syrg injection 0.5 mL by IntraMUSCular route once  for 1 dose. 0.5 mL 0   ??? Blood-Glucose Meter monitoring kit Use BID TRUEMETRIX Dx e11.9 1 Kit 0   ??? glucose blood VI test strips (BLOOD GLUCOSE TEST) strip Use BID Dx11.9 TRUEMETRIX 100 Strip 11   ??? apixaban (ELIQUIS) 5 mg tablet Take 1 Tab by mouth two (2) times a day. 60 Tab 1   ??? lisinopril (PRINIVIL, ZESTRIL) 10 mg tablet Take 1 Tab by mouth daily. 90 Tab 1   ??? metFORMIN (GLUCOPHAGE) 1,000 mg tablet Take 1 Tab by mouth two (2) times daily (with meals). 180 Tab 1   ??? methIMAzole (TAPAZOLE) 5 mg tablet Take 1 Tab by mouth daily. 90 Tab 1   ??? metoprolol tartrate (LOPRESSOR) 50 mg tablet Take 1 Tab by mouth two (2) times a day. 180 Tab 1   ??? rosuvastatin (CRESTOR) 20 mg tablet Take 1 Tab by mouth nightly. 90 Tab 1   ??? triamterene-hydroCHLOROthiazide (DYAZIDE) 37.5-25 mg per capsule TAKE 1 CAPSULE BY MOUTH EVERY DAY 90 Cap 1   ??? BREO ELLIPTA 200-25 mcg/dose inhaler INHALE 1 PUFF BY MOUTH DAILY 1 Each 5   ??? lancets misc Use BID.  Dx: E11.9 100 Each 11   ??? nystatin (MYCOSTATIN) powder Apply  to affected area four (4) times daily. 120 g 3   ??? CALCIUM 600 + D tablet take 1 tablet by mouth twice a day 60 Tab 11   ??? aspirin-calcium carbonate 81 mg-300 mg calcium(777 mg) tab Take 81 mg by mouth.     ??? aspirin delayed-release 81 mg tablet Take 1 Tab by mouth daily. 30 Tab 11   ??? oxyCODONE-acetaminophen (PERCOCET 10) 10-325 mg per tablet take 1/2 TO 1 tablet once daily if needed for pain  0   ??? FREESTYLE LANCETS 28 gauge misc   1   ??? JANUMET 50-500 mg per tablet take 1 tablet by mouth once daily 30 Tab 1   ??? cyanocobalamin 1,000 mcg tablet Take 1,000 mcg by mouth daily.       Allergies   Allergen Reactions   ??? Almond Oil Anaphylaxis, Hives and Itching   ??? Nuts [Tree Nut] Anaphylaxis     Family History   Problem Relation Age of Onset   ??? Diabetes Mother    ??? Heart Disease Mother         rheumatic fever   ??? Breast Cancer Mother    ??? Cancer Father         lung   ??? Cancer Sister         colon & breast   ??? Breast Cancer Sister    ???  Cancer Brother         throat     Social History     Tobacco Use   ??? Smoking status: Former Smoker     Packs/day: 0.25     Years: 1.00     Pack years: 0.25     Types: Cigarettes     Last attempt to quit: 06/03/1991     Years since quitting: 26.2   ??? Smokeless tobacco: Never Used   Substance Use Topics   ??? Alcohol use: No     Alcohol/week: 0.0 oz     Patient Active Problem List   Diagnosis Code   ??? Essential hypertension I10   ??? Neuropathic arthritis M14.60   ??? Low TSH level R79.89   ??? Acid reflux K21.9   ??? Hypercholesterolemia E78.00   ??? Rash in adult R21   ??? Lung nodule seen on imaging study R91.1   ??? 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 Chronic right hip pain M25.551, G89.29   ??? Gallbladder polyp K82.4   ??? Umbilical hernia I43.3   ??? Microalbuminuria R80.9   ??? Abnormal finding on EKG R94.31   ??? Slow transit constipation K59.01   ??? Chronic pain of left knee M25.562, G89.29   ??? Osteopenia M85.80   ??? ACP (advance care planning) Z71.89   ??? 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 Morbid obesity (HCC) E66.01   ??? DM2 (diabetes mellitus, type 2) (HCC) E11.9   ??? Hyperthyroidism E05.90   ??? Abnormal nuclear stress test R94.39       Depression Risk Factor Screening:     3 most recent PHQ Screens 08/28/2017   Little interest or pleasure in doing things Not at all   Feeling down, depressed, irritable, or hopeless Not at all   Total Score PHQ 2 0     Alcohol Risk Factor Screening:   You do  not drink alcohol or very rarely.    Functional Ability and Level of Safety:   Hearing Loss  Hearing is good.    Activities of Daily Living  The home contains: grab bars  Patient needs help with:  transportation and housework    Fall Risk  Fall Risk Assessment, last 12 mths 08/28/2017   Able to walk? Yes   Fall in past 12 months? No       Abuse Screen  Patient is not abused    Cognitive Screening   Evaluation of Cognitive  Function:  Has your family/caregiver stated any concerns about your memory: no  Normal    Patient Care Team   Patient Care Team:  Karlyn Agee, MD as PCP - General (Internal Medicine)  Drinda Butts, Hassan Rowan, MD (Endocrinology)  Lupe Carney, MD as Physician (Cardiology)    Assessment/Plan   Education and counseling provided:  Pneumococcal Vaccine  shingrix    Diagnoses and all orders for this visit:    1. Medicare annual wellness visit, subsequent    Other orders  -     varicella-zoster recombinant, PF, (SHINGRIX) 50 mcg/0.5 mL susr injection; 0.5 mL by IntraMUSCular route once for 1 dose.  -     pneumococcal 13 val conj dip (PREVNAR-13) 0.5 mL syrg injection; 0.5 mL by IntraMUSCular route once for 1 dose.        Health Maintenance Due   Topic Date Due   ??? Shingrix Vaccine Age 75> (1 of 2) 07/24/1992   ??? Pneumococcal 65+ years (1 of 2 - PCV13) 07/25/2007   ??? MEDICARE YEARLY EXAM  05/17/2016   ??? FOOT EXAM Q1  08/23/2016   ??? MICROALBUMIN Q1  04/19/2017   ??? GLAUCOMA SCREENING Q2Y  04/27/2017   ??? EYE EXAM RETINAL OR DILATED  04/27/2017

## 2017-08-28 NOTE — Progress Notes (Signed)
This is the Subsequent Medicare Annual Wellness Exam, performed 12 months or more after the Initial AWV or the last Subsequent AWV    I have reviewed the patient's medical history in detail and updated the computerized patient record.     History     Past Medical History:   Diagnosis Date   ??? Abdominal pain 05/08/2013   ??? Abnormal finding on EKG 11/17/2013   ??? ACP (advance care planning) 05/03/2015   ??? Acute pulmonary embolism ([place]) 06/08/2017   ??? Arthritis    ??? 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 Chronic pain of left knee 08/13/2014   ??? Chronic right hip pain 03/17/2013   ??? COPD (chronic obstructive pulmonary disease) with chronic bronchitis ([place]) 04/19/2016   ??? Diabetes ([place])    ??? Elevated BUN 09/13/2011   ??? Finger lesion 03/02/2016   ??? Gallbladder polyp 04/08/2013   ??? Gastrointestinal disorder     GERD   ??? GERD (gastroesophageal reflux disease)    ??? Hypercholesterolemia    ??? Hypertension    ??? 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 Microalbuminuria 08/29/2013   ??? 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, adult ([place]) 08/28/2011   ??? 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 On aspirin at home 05/03/2015   ??? Other unknown and unspecified cause of morbidity or mortality     hx of bronchitis   ??? Rash, skin 09/25/2012   ??? 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 Slow transit constipation 1/0/2725   ??? Umbilical hernia 05/10/6438      Past Surgical History:   Procedure Laterality Date   ??? 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 HX ORTHOPAEDIC  2000    screw in foot left   ??? HX OTHER SURGICAL      colonoscopy   ??? HX TUBAL LIGATION       Current Outpatient Medications   Medication Sig Dispense Refill   ??? varicella-zoster recombinant, PF, (SHINGRIX) 50 mcg/0.5 mL susr injection 0.5 mL by IntraMUSCular route once for 1 dose. 0.5 mL 1   ??? pneumococcal 13 val conj dip (PREVNAR-13) 0.5 mL syrg injection 0.5 mL  by IntraMUSCular route once for 1 dose. 0.5 mL 0   ??? Blood-Glucose Meter monitoring kit Use BID TRUEMETRIX Dx e11.9 1 Kit 0   ??? glucose blood VI test strips (BLOOD GLUCOSE TEST) strip Use BID Dx11.9 TRUEMETRIX 100 Strip 11   ??? apixaban (ELIQUIS) 5 mg tablet Take 1 Tab by mouth two (2) times a day. 60 Tab 1   ??? lisinopril (PRINIVIL, ZESTRIL) 10 mg tablet Take 1 Tab by mouth daily. 90 Tab 1   ??? metFORMIN (GLUCOPHAGE) 1,000 mg tablet Take 1 Tab by mouth two (2) times daily (with meals). 180 Tab 1   ??? methIMAzole (TAPAZOLE) 5 mg tablet Take 1 Tab by mouth daily. 90 Tab 1   ??? metoprolol tartrate (LOPRESSOR) 50 mg tablet Take 1 Tab by mouth two (2) times a day. 180 Tab 1   ??? rosuvastatin (CRESTOR) 20 mg tablet Take 1 Tab by mouth nightly. 90 Tab 1   ??? triamterene-hydroCHLOROthiazide (DYAZIDE) 37.5-25 mg per capsule TAKE 1 CAPSULE BY MOUTH EVERY DAY 90 Cap 1   ??? BREO ELLIPTA 200-25 mcg/dose inhaler INHALE 1 PUFF BY MOUTH DAILY 1 Each 5   ??? lancets misc Use BID.  Dx: E11.9 100 Each 11   ??? nystatin (MYCOSTATIN) powder Apply  to affected area four (4) times daily. 120 g 3   ??? CALCIUM 600 + D tablet take 1 tablet by mouth twice a day 60 Tab 11   ??? aspirin-calcium carbonate 81 mg-300 mg calcium(777 mg) tab Take 81 mg by mouth.     ??? aspirin delayed-release 81 mg tablet Take 1 Tab by mouth daily. 30 Tab 11   ??? oxyCODONE-acetaminophen (PERCOCET 10) 10-325 mg per tablet take 1/2 TO 1 tablet once daily if needed for pain  0   ??? FREESTYLE LANCETS 28 gauge misc   1   ??? JANUMET 50-500 mg per tablet take 1 tablet by mouth once daily 30 Tab 1   ??? cyanocobalamin 1,000 mcg tablet Take 1,000 mcg by mouth daily.       Allergies   Allergen Reactions   ??? Almond Oil Anaphylaxis, Hives and Itching   ??? Nuts [Tree Nut] Anaphylaxis     Family History   Problem Relation Age of Onset   ??? Diabetes Mother    ??? Heart Disease Mother         rheumatic fever   ??? Breast Cancer Mother    ??? Cancer Father         lung   ??? Cancer Sister         colon & breast    ??? Breast Cancer Sister    ??? Cancer Brother         throat     Social History     Tobacco Use   ??? Smoking status: Former Smoker     Packs/day: 0.25     Years: 1.00     Pack years: 0.25     Types: Cigarettes     Last attempt to quit: 06/03/1991     Years since quitting: 26.2   ??? Smokeless tobacco: Never Used   Substance Use Topics   ??? Alcohol use: No     Alcohol/week: 0.0 oz     Patient Active Problem List   Diagnosis Code   ??? Essential hypertension I10   ??? Neuropathic arthritis M14.60   ??? Low TSH level R79.89   ??? Acid reflux K21.9   ??? Hypercholesterolemia E78.00   ??? Rash in adult R21   ??? Lung nodule seen on imaging study R91.1   ??? 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 Chronic right hip pain M25.551, G89.29   ??? Gallbladder polyp K82.4   ??? Umbilical hernia J50.0   ??? Microalbuminuria R80.9   ??? Abnormal finding on EKG R94.31   ??? Slow transit constipation K59.01   ??? Chronic pain of left knee M25.562, G89.29   ??? Osteopenia M85.80   ??? ACP (advance care planning) Z71.89   ??? 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 Morbid obesity (HCC) E66.01   ??? DM2 (diabetes mellitus, type 2) (HCC) E11.9   ??? Hyperthyroidism E05.90   ??? Abnormal nuclear stress test R94.39       Depression Risk Factor Screening:     3 most recent PHQ Screens 08/28/2017   Little interest or pleasure in doing things Not at all   Feeling down, depressed, irritable, or hopeless Not at all   Total Score PHQ 2 0     Alcohol Risk Factor Screening:   You do  not drink alcohol or very rarely.    Functional Ability and Level of Safety:   Hearing Loss  Hearing is good.    Activities of Daily Living  The home contains: grab bars  Patient needs help with:  transportation and housework    Fall Risk  Fall Risk Assessment, last 12 mths 08/28/2017   Able to walk? Yes   Fall in past 12 months? No       Abuse Screen  Patient is not abused    Cognitive Screening    Evaluation of Cognitive Function:  Has your family/caregiver stated any concerns about your memory: no  Normal    Patient Care Team   Patient Care Team:  Karlyn Agee, MD as PCP - General (Internal Medicine)  Drinda Butts, Hassan Rowan, MD (Endocrinology)  Lupe Carney, MD as Physician (Cardiology)    Assessment/Plan   Education and counseling provided:  Pneumococcal Vaccine  shingrix    Diagnoses and all orders for this visit:    1. Medicare annual wellness visit, subsequent    Other orders  -     varicella-zoster recombinant, PF, (SHINGRIX) 50 mcg/0.5 mL susr injection; 0.5 mL by IntraMUSCular route once for 1 dose.  -     pneumococcal 13 val conj dip (PREVNAR-13) 0.5 mL syrg injection; 0.5 mL by IntraMUSCular route once for 1 dose.        Health Maintenance Due   Topic Date Due   ??? Shingrix Vaccine Age 51> (1 of 2) 07/24/1992   ??? Pneumococcal 65+ years (1 of 2 - PCV13) 07/25/2007   ??? MEDICARE YEARLY EXAM  05/17/2016   ??? FOOT EXAM Q1  08/23/2016   ??? MICROALBUMIN Q1  04/19/2017   ??? GLAUCOMA SCREENING Q2Y  04/27/2017   ??? EYE EXAM RETINAL OR DILATED  04/27/2017

## 2017-09-02 ENCOUNTER — Encounter

## 2017-09-04 MED ORDER — ELIQUIS 5 MG TABLET
5 mg | ORAL_TABLET | ORAL | 0 refills | Status: DC
Start: 2017-09-04 — End: 2017-10-01

## 2017-09-18 DIAGNOSIS — M47816 Spondylosis without myelopathy or radiculopathy, lumbar region: Secondary | ICD-10-CM | POA: Diagnosis not present

## 2017-09-18 DIAGNOSIS — M961 Postlaminectomy syndrome, not elsewhere classified: Secondary | ICD-10-CM | POA: Diagnosis not present

## 2017-09-21 DIAGNOSIS — M47816 Spondylosis without myelopathy or radiculopathy, lumbar region: Secondary | ICD-10-CM | POA: Diagnosis not present

## 2017-09-21 DIAGNOSIS — I10 Essential (primary) hypertension: Secondary | ICD-10-CM | POA: Diagnosis not present

## 2017-09-21 DIAGNOSIS — Z6833 Body mass index (BMI) 33.0-33.9, adult: Secondary | ICD-10-CM | POA: Diagnosis not present

## 2017-09-24 ENCOUNTER — Inpatient Hospital Stay
Admit: 2017-09-24 | Discharge: 2017-09-25 | Disposition: A | Discharge status: Home / Self Care | Payer: MEDICARE | Attending: Emergency Medicine

## 2017-09-24 DIAGNOSIS — R609 Edema, unspecified: Secondary | ICD-10-CM

## 2017-09-24 LAB — SAMPLES BEING HELD

## 2017-09-24 NOTE — ED Triage Notes (Signed)
Pt presents to Ed with complaints of bilateral leg swelling. Pt states it "just started a few days ago, I've never seen my feet and legs swell like this" Pt states she was seen by a cardiologist, Sabarwhal, earlier this year and was told her heart is healthy. Pt denies SOB. Pt does report history of arthritis which makes "my legs hurt but not swell like this"

## 2017-09-24 NOTE — ED Triage Notes (Signed)
Weeks of swelling of both legs and feet

## 2017-09-24 NOTE — ED Notes (Signed)
Weeks of swelling of both legs and feet

## 2017-09-24 NOTE — ED Provider Notes (Signed)
77 y.o. female with past medical history significant for HTN, DM, arthritis, COPD, and acute PE who presents from private vehicle with chief complaint of leg swelling. Pt reports bilateral leg and foot swelling for 2 weeks. Pt notes accompanying bilateral leg pain. Pt notes the leg swelling has improved. Pt reports she has an appointment with her PCP on 09/28/17. Pt notes she had a nice chair that kept her feet elevated but she gave it away. Pt states she is taking eliquis d/t history of PE. Pt denies chest pain or SOB. There are no other acute medical concerns at this time.    PCP: Barbarann Ehlers, MD    Cardiology: Arabella Merles, MD    Note written by Erick Colace, Scribe, as dictated by Domingo Cocking, MD 8:15 PM             Past Medical History:   Diagnosis Date   ??? Abdominal pain 05/08/2013   ??? Abnormal finding on EKG 11/17/2013   ??? ACP (advance care planning) 05/03/2015   ??? Acute pulmonary embolism ([facility]) 06/08/2017   ??? Arthritis    ??? 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 Chronic pain of left knee 08/13/2014   ??? Chronic right hip pain 03/17/2013   ??? 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 Diabetes ([facility])    ??? Elevated BUN 09/13/2011   ??? Finger lesion 03/02/2016   ??? Gallbladder polyp 04/08/2013   ??? Gastrointestinal disorder     GERD   ??? GERD (gastroesophageal reflux disease)    ??? Hypercholesterolemia    ??? Hypertension    ??? 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 Microalbuminuria 08/29/2013   ??? 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 On aspirin at home 05/03/2015   ??? Other unknown and unspecified cause of morbidity or mortality     hx of bronchitis   ??? Rash, skin 09/25/2012   ??? 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 Slow transit constipation 08/13/2014   ??? Umbilical hernia 05/08/2013       Past Surgical  History:   Procedure Laterality Date   ??? 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 HX ORTHOPAEDIC  2000    screw in foot left   ??? HX OTHER SURGICAL      colonoscopy   ??? HX TUBAL LIGATION           Family History:   Problem Relation Age of Onset   ??? Diabetes Mother    ??? Heart Disease Mother         rheumatic fever   ??? Breast Cancer Mother    ??? Cancer Father         lung   ??? Cancer Sister         colon & breast   ??? Breast Cancer Sister    ??? Cancer Brother         throat       Social History     Socioeconomic History   ??? Marital status: DIVORCED     Spouse name: Not on file   ??? Number of children: Not on file   ??? Years of education: Not on file   ??? Highest education level: Not on file  Occupational History   ??? Not on file   Social Needs   ??? Financial resource strain: Not on file   ??? Food insecurity:     Worry: Not on file     Inability: Not on file   ??? Transportation needs:     Medical: Not on file     Non-medical: Not on file   Tobacco Use   ??? Smoking status: Former Smoker     Packs/day: 0.25     Years: 1.00     Pack years: 0.25     Types: Cigarettes     Last attempt to quit: 06/03/1991     Years since quitting: 26.3   ??? Smokeless tobacco: Never Used   Substance and Sexual Activity   ??? Alcohol use: No     Alcohol/week: 0.0 standard drinks   ??? Drug use: No   ??? Sexual activity: Never   Lifestyle   ??? Physical activity:     Days per week: Not on file     Minutes per session: Not on file   ??? Stress: Not on file   Relationships   ??? Social connections:     Talks on phone: Not on file     Gets together: Not on file     Attends religious service: Not on file     Active member of club or organization: Not on file     Attends meetings of clubs or organizations: Not on file     Relationship status: Not on file   ??? Intimate partner violence:     Fear of current or ex partner: Not on file     Emotionally abused: Not on file     Physically abused: Not on file     Forced sexual activity: Not on file   Other Topics Concern    ??? Not on file   Social History Narrative   ??? Not on file         ALLERGIES: Almond oil and Nuts [tree nut]    Review of Systems   Constitutional: Negative for fever.   HENT: Negative for facial swelling.    Eyes: Negative for visual disturbance.   Respiratory: Negative for chest tightness and shortness of breath.    Cardiovascular: Positive for leg swelling. Negative for chest pain.   Gastrointestinal: Negative for abdominal pain.   Genitourinary: Negative for difficulty urinating and dysuria.   Musculoskeletal: Positive for myalgias. Negative for arthralgias.   Skin: Negative for rash.   Neurological: Negative for headaches.   Hematological: Negative for adenopathy.   Psychiatric/Behavioral: Negative for suicidal ideas.       Vitals:    09/24/17 1914 09/24/17 2016   BP:  119/57   Pulse:  94   Resp:  18   Temp:  97.9 ??F (36.6 ??C)   SpO2: 97% 95%            Physical Exam   Constitutional: She is oriented to person, place, and time. She appears well-developed and well-nourished. No distress.   HENT:   Head: Normocephalic and atraumatic.   Mouth/Throat: Oropharynx is clear and moist.   Eyes: Pupils are equal, round, and reactive to light. No scleral icterus.   Neck: Normal range of motion. Neck supple. No thyromegaly present.   Cardiovascular: Normal rate, regular rhythm, normal heart sounds and intact distal pulses.   No murmur heard.  Pulmonary/Chest: Effort normal and breath sounds normal. No respiratory distress.   Abdominal: Soft. Bowel sounds are  normal. She exhibits no distension. There is no tenderness.   Musculoskeletal: Normal range of motion. She exhibits edema.   Bilateral lower extremity non pitting edema.    Neurological: She is alert and oriented to person, place, and time.   Skin: Skin is warm and dry. No rash noted. She is not diaphoretic.   Nursing note and vitals reviewed.   Note written by Erick Colace, Scribe, as dictated by Domingo Cocking, MD 8:15 PM      MDM  Number of Diagnoses or  Management Options  Dependent edema:   Diagnosis management comments: A:  Swelling in both lower extremities off and on for past 2 weeks.  Now improved.  No CP or SOB.  No h/o cardiac disease.  Exam with b/l lower ext edema.  No evidence of infection.  Labs with slight increase in creatinine.  Will try low dose lasix and leg elevation for next 3 days.  Has appt with PCP on Friday.       ED EKG interpretation:  Rhythm: normal sinus rhythm. Rate (approx.): 74.  Axis: normal.  ST segment:  No concerning ST elevations or depressions. This EKG was interpreted by Domingo Cocking, MD,ED Provider.    Procedures

## 2017-09-24 NOTE — ED Notes (Signed)
 Pt presents to Ed with complaints of bilateral leg swelling. Pt states it just started a few days ago, I've never seen my feet and legs swell like this Pt states she was seen by a cardiologist, Nance, earlier this year and was told her heart is healthy. Pt denies SOB. Pt does report history of arthritis which makes my legs hurt but not swell like this

## 2017-09-24 NOTE — ED Provider Notes (Signed)
77 y.o. female with past medical history significant for HTN, DM, arthritis, COPD, and acute PE who presents from private vehicle with chief complaint of leg swelling. Pt reports bilateral leg and foot swelling for 2 weeks. Pt notes accompanying bilateral leg pain. Pt notes the leg swelling has improved. Pt reports she has an appointment with her PCP on 09/28/17. Pt notes she had a nice chair that kept her feet elevated but she gave it away. Pt states she is taking eliquis d/t history of PE. Pt denies chest pain or SOB. There are no other acute medical concerns at this time.    PCP: Barbarann Ehlers, MD    Cardiology: Arabella Merles, MD    Note written by Erick Colace, Scribe, as dictated by Domingo Cocking, MD 8:15 PM             Past Medical History:   Diagnosis Date   ??? Abdominal pain 05/08/2013   ??? Abnormal finding on EKG 11/17/2013   ??? ACP (advance care planning) 05/03/2015   ??? Acute pulmonary embolism (HCC) 06/08/2017   ??? Arthritis    ??? 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 Chronic pain of left knee 08/13/2014   ??? Chronic right hip pain 03/17/2013   ??? 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 Diabetes ([facility])    ??? Elevated BUN 09/13/2011   ??? Finger lesion 03/02/2016   ??? Gallbladder polyp 04/08/2013   ??? Gastrointestinal disorder     GERD   ??? GERD (gastroesophageal reflux disease)    ??? Hypercholesterolemia    ??? Hypertension    ??? 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 Microalbuminuria 08/29/2013   ??? 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 On aspirin at home 05/03/2015   ??? Other unknown and unspecified cause of morbidity or mortality     hx of bronchitis   ??? Rash, skin 09/25/2012   ??? 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 Slow transit constipation 08/13/2014   ??? Umbilical hernia 05/08/2013        Past Surgical History:   Procedure Laterality Date   ??? 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 HX ORTHOPAEDIC  2000    screw in foot left   ??? HX OTHER SURGICAL      colonoscopy   ??? HX TUBAL LIGATION           Family History:   Problem Relation Age of Onset   ??? Diabetes Mother    ??? Heart Disease Mother         rheumatic fever   ??? Breast Cancer Mother    ??? Cancer Father         lung   ??? Cancer Sister         colon & breast   ??? Breast Cancer Sister    ??? Cancer Brother         throat       Social History     Socioeconomic History   ??? Marital status: DIVORCED     Spouse name: Not on file   ??? Number of children: Not on file   ??? Years of education: Not on file   ??? Highest education level: Not on file  Occupational History   ??? Not on file   Social Needs   ??? Financial resource strain: Not on file   ??? Food insecurity:     Worry: Not on file     Inability: Not on file   ??? Transportation needs:     Medical: Not on file     Non-medical: Not on file   Tobacco Use   ??? Smoking status: Former Smoker     Packs/day: 0.25     Years: 1.00     Pack years: 0.25     Types: Cigarettes     Last attempt to quit: 06/03/1991     Years since quitting: 26.3   ??? Smokeless tobacco: Never Used   Substance and Sexual Activity   ??? Alcohol use: No     Alcohol/week: 0.0 standard drinks   ??? Drug use: No   ??? Sexual activity: Never   Lifestyle   ??? Physical activity:     Days per week: Not on file     Minutes per session: Not on file   ??? Stress: Not on file   Relationships   ??? Social connections:     Talks on phone: Not on file     Gets together: Not on file     Attends religious service: Not on file     Active member of club or organization: Not on file     Attends meetings of clubs or organizations: Not on file     Relationship status: Not on file   ??? Intimate partner violence:     Fear of current or ex partner: Not on file     Emotionally abused: Not on file     Physically abused: Not on file     Forced sexual activity: Not on file    Other Topics Concern   ??? Not on file   Social History Narrative   ??? Not on file         ALLERGIES: Almond oil and Nuts [tree nut]    Review of Systems   Constitutional: Negative for fever.   HENT: Negative for facial swelling.    Eyes: Negative for visual disturbance.   Respiratory: Negative for chest tightness and shortness of breath.    Cardiovascular: Positive for leg swelling. Negative for chest pain.   Gastrointestinal: Negative for abdominal pain.   Genitourinary: Negative for difficulty urinating and dysuria.   Musculoskeletal: Positive for myalgias. Negative for arthralgias.   Skin: Negative for rash.   Neurological: Negative for headaches.   Hematological: Negative for adenopathy.   Psychiatric/Behavioral: Negative for suicidal ideas.       Vitals:    09/24/17 1914 09/24/17 2016   BP:  119/57   Pulse:  94   Resp:  18   Temp:  97.9 ??F (36.6 ??C)   SpO2: 97% 95%            Physical Exam   Constitutional: She is oriented to person, place, and time. She appears well-developed and well-nourished. No distress.   HENT:   Head: Normocephalic and atraumatic.   Mouth/Throat: Oropharynx is clear and moist.   Eyes: Pupils are equal, round, and reactive to light. No scleral icterus.   Neck: Normal range of motion. Neck supple. No thyromegaly present.   Cardiovascular: Normal rate, regular rhythm, normal heart sounds and intact distal pulses.   No murmur heard.  Pulmonary/Chest: Effort normal and breath sounds normal. No respiratory distress.   Abdominal: Soft. Bowel sounds are  normal. She exhibits no distension. There is no tenderness.   Musculoskeletal: Normal range of motion. She exhibits edema.   Bilateral lower extremity non pitting edema.    Neurological: She is alert and oriented to person, place, and time.   Skin: Skin is warm and dry. No rash noted. She is not diaphoretic.   Nursing note and vitals reviewed.   Note written by Erick Colace, Scribe, as dictated by Domingo Cocking, MD 8:15 PM      MDM   Number of Diagnoses or Management Options  Dependent edema:   Diagnosis management comments: A:  Swelling in both lower extremities off and on for past 2 weeks.  Now improved.  No CP or SOB.  No h/o cardiac disease.  Exam with b/l lower ext edema.  No evidence of infection.  Labs with slight increase in creatinine.  Will try low dose lasix and leg elevation for next 3 days.  Has appt with PCP on Friday.       ED EKG interpretation:  Rhythm: normal sinus rhythm. Rate (approx.): 74.  Axis: normal.  ST segment:  No concerning ST elevations or depressions. This EKG was interpreted by Domingo Cocking, MD,ED Provider.    Procedures

## 2017-09-25 LAB — METABOLIC PANEL, COMPREHENSIVE
A-G Ratio: 0.8 — ABNORMAL LOW (ref 1.1–2.2)
ALT (SGPT): 16 U/L (ref 12–78)
AST (SGOT): 14 U/L — ABNORMAL LOW (ref 15–37)
Albumin: 3 g/dL — ABNORMAL LOW (ref 3.5–5.0)
Alk. phosphatase: 57 U/L (ref 45–117)
Anion gap: 8 mmol/L (ref 5–15)
BUN/Creatinine ratio: 21 — ABNORMAL HIGH (ref 12–20)
BUN: 27 MG/DL — ABNORMAL HIGH (ref 6–20)
Bilirubin, total: 0.3 MG/DL (ref 0.2–1.0)
CO2: 24 mmol/L (ref 21–32)
Calcium: 8.9 MG/DL (ref 8.5–10.1)
Chloride: 111 mmol/L — ABNORMAL HIGH (ref 97–108)
Creatinine: 1.27 MG/DL — ABNORMAL HIGH (ref 0.55–1.02)
GFR est AA: 50 mL/min/{1.73_m2} — ABNORMAL LOW (ref >60)
GFR est non-AA: 41 mL/min/{1.73_m2} — ABNORMAL LOW (ref >60)
Globulin: 3.7 g/dL (ref 2.0–4.0)
Glucose: 128 mg/dL — ABNORMAL HIGH (ref 65–100)
Potassium: 4 mmol/L (ref 3.5–5.1)
Protein, total: 6.7 g/dL (ref 6.4–8.2)
Sodium: 143 mmol/L (ref 136–145)

## 2017-09-25 LAB — CBC WITH AUTOMATED DIFF
ABS. BASOPHILS: 0 10*3/uL (ref 0.0–0.1)
ABS. EOSINOPHILS: 0.1 10*3/uL (ref 0.0–0.4)
ABS. IMM. GRANS.: 0 10*3/uL
ABS. LYMPHOCYTES: 1.7 10*3/uL (ref 0.8–3.5)
ABS. MONOCYTES: 0.1 10*3/uL (ref 0.0–1.0)
ABS. NEUTROPHILS: 4.1 10*3/uL (ref 1.8–8.0)
ABSOLUTE NRBC: 0 10*3/uL (ref 0.00–0.01)
BASOPHILS: 0 % (ref 0–1)
EOSINOPHILS: 1 % (ref 0–7)
HCT: 38.2 % (ref 35.0–47.0)
HGB: 11.8 g/dL (ref 11.5–16.0)
IMMATURE GRANULOCYTES: 0 %
LYMPHOCYTES: 28 % (ref 12–49)
MCH: 29.2 PG (ref 26.0–34.0)
MCHC: 30.9 g/dL (ref 30.0–36.5)
MCV: 94.6 FL (ref 80.0–99.0)
MONOCYTES: 2 % — ABNORMAL LOW (ref 5–13)
MPV: 12.3 FL (ref 8.9–12.9)
NEUTROPHILS: 69 % (ref 32–75)
NRBC: 0 PER 100 WBC
PLATELET: 150 10*3/uL (ref 150–400)
RBC: 4.04 M/uL (ref 3.80–5.20)
RDW: 13.8 % (ref 11.5–14.5)
WBC: 6 10*3/uL (ref 3.6–11.0)

## 2017-09-25 LAB — EKG, 12 LEAD, INITIAL
Atrial Rate: 74 {beats}/min
Calculated P Axis: 36 degrees
Calculated R Axis: 9 degrees
Calculated T Axis: -7 degrees
Diagnosis: NORMAL
P-R Interval: 178 ms
Q-T Interval: 392 ms
QRS Duration: 92 ms
QTC Calculation (Bezet): 435 ms
Ventricular Rate: 74 {beats}/min

## 2017-09-25 LAB — COMPREHENSIVE METABOLIC PANEL
ALT: 16 U/L (ref 12–78)
AST: 14 U/L — ABNORMAL LOW (ref 15–37)
Albumin/Globulin Ratio: 0.8 — ABNORMAL LOW (ref 1.1–2.2)
Albumin: 3 g/dL — ABNORMAL LOW (ref 3.5–5.0)
Alkaline Phosphatase: 57 U/L (ref 45–117)
Anion Gap: 8 mmol/L (ref 5–15)
BUN: 27 MG/DL — ABNORMAL HIGH (ref 6–20)
Bun/Cre Ratio: 21 — ABNORMAL HIGH (ref 12–20)
CO2: 24 mmol/L (ref 21–32)
Calcium: 8.9 MG/DL (ref 8.5–10.1)
Chloride: 111 mmol/L — ABNORMAL HIGH (ref 97–108)
Creatinine: 1.27 MG/DL — ABNORMAL HIGH (ref 0.55–1.02)
EGFR IF NonAfrican American: 41 mL/min/{1.73_m2} — ABNORMAL LOW (ref >60)
GFR African American: 50 mL/min/{1.73_m2} — ABNORMAL LOW (ref >60)
Globulin: 3.7 g/dL (ref 2.0–4.0)
Glucose: 128 mg/dL — ABNORMAL HIGH (ref 65–100)
Potassium: 4 mmol/L (ref 3.5–5.1)
Sodium: 143 mmol/L (ref 136–145)
Total Bilirubin: 0.3 MG/DL (ref 0.2–1.0)
Total Protein: 6.7 g/dL (ref 6.4–8.2)

## 2017-09-25 LAB — CBC WITH AUTO DIFFERENTIAL
Basophils %: 0 % (ref 0–1)
Basophils Absolute: 0 10*3/uL (ref 0.0–0.1)
Eosinophils %: 1 % (ref 0–7)
Eosinophils Absolute: 0.1 10*3/uL (ref 0.0–0.4)
Granulocyte Absolute Count: 0 10*3/uL
Hematocrit: 38.2 % (ref 35.0–47.0)
Hemoglobin: 11.8 g/dL (ref 11.5–16.0)
Immature Granulocytes: 0 %
Lymphocytes %: 28 % (ref 12–49)
Lymphocytes Absolute: 1.7 10*3/uL (ref 0.8–3.5)
MCH: 29.2 PG (ref 26.0–34.0)
MCHC: 30.9 g/dL (ref 30.0–36.5)
MCV: 94.6 FL (ref 80.0–99.0)
MPV: 12.3 FL (ref 8.9–12.9)
Monocytes %: 2 % — ABNORMAL LOW (ref 5–13)
Monocytes Absolute: 0.1 10*3/uL (ref 0.0–1.0)
NRBC Absolute: 0 10*3/uL (ref 0.00–0.01)
Neutrophils %: 69 % (ref 32–75)
Neutrophils Absolute: 4.1 10*3/uL (ref 1.8–8.0)
Nucleated RBCs: 0 PER 100 WBC
Platelets: 150 10*3/uL (ref 150–400)
RBC: 4.04 M/uL (ref 3.80–5.20)
RDW: 13.8 % (ref 11.5–14.5)
WBC: 6 10*3/uL (ref 3.6–11.0)

## 2017-09-25 LAB — EKG 12-LEAD
Atrial Rate: 74 {beats}/min
Diagnosis: NORMAL
P Axis: 36 degrees
P-R Interval: 178 ms
Q-T Interval: 392 ms
QRS Duration: 92 ms
QTc Calculation (Bazett): 435 ms
R Axis: 9 degrees
T Axis: -7 degrees
Ventricular Rate: 74 {beats}/min

## 2017-09-25 MED ORDER — FUROSEMIDE 20 MG TAB
20 mg | ORAL_TABLET | Freq: Every day | ORAL | 0 refills | Status: AC
Start: 2017-09-25 — End: 2017-09-29

## 2017-09-28 ENCOUNTER — Encounter: Attending: Internal Medicine | Primary: Geriatric Medicine

## 2017-09-28 DIAGNOSIS — Z0000 Encounter for general adult medical examination without abnormal findings: Secondary | ICD-10-CM | POA: Diagnosis not present

## 2017-09-28 DIAGNOSIS — R600 Localized edema: Secondary | ICD-10-CM | POA: Diagnosis not present

## 2017-09-28 DIAGNOSIS — I10 Essential (primary) hypertension: Secondary | ICD-10-CM | POA: Diagnosis not present

## 2017-09-28 DIAGNOSIS — Z1389 Encounter for screening for other disorder: Secondary | ICD-10-CM | POA: Diagnosis not present

## 2017-09-28 DIAGNOSIS — Z7189 Other specified counseling: Secondary | ICD-10-CM | POA: Diagnosis not present

## 2017-09-28 DIAGNOSIS — M4800 Spinal stenosis, site unspecified: Secondary | ICD-10-CM | POA: Diagnosis not present

## 2017-09-28 DIAGNOSIS — Z1211 Encounter for screening for malignant neoplasm of colon: Secondary | ICD-10-CM | POA: Diagnosis not present

## 2017-09-28 DIAGNOSIS — E7800 Pure hypercholesterolemia, unspecified: Secondary | ICD-10-CM | POA: Diagnosis not present

## 2017-10-01 ENCOUNTER — Encounter

## 2017-10-05 MED ORDER — ELIQUIS 5 MG TABLET
5 mg | ORAL_TABLET | ORAL | 0 refills | Status: DC
Start: 2017-10-05 — End: 2017-11-02

## 2017-10-05 MED ORDER — METHIMAZOLE 5 MG TAB
5 mg | ORAL_TABLET | ORAL | 1 refills | Status: DC
Start: 2017-10-05 — End: 2018-02-04

## 2017-10-12 ENCOUNTER — Ambulatory Visit: Attending: Internal Medicine | Primary: Geriatric Medicine

## 2017-10-12 ENCOUNTER — Ambulatory Visit: Admit: 2017-10-12 | Discharge: 2017-10-12 | Payer: MEDICARE | Attending: Internal Medicine | Primary: Geriatric Medicine

## 2017-10-12 DIAGNOSIS — I2609 Other pulmonary embolism with acute cor pulmonale: Secondary | ICD-10-CM

## 2017-10-12 LAB — AMB POC LIPID PROFILE
Cholesterol (POC): 116
Cholesterol, POC: 116 NA
HDL Cholesterol (POC): 54
HDL Cholesterol, POC: 54 NA
LDL Cholesterol (POC): 46 MG/DL
LDL Cholesterol, POC: 46 MG/DL
Non-HDL Goal (POC): 62
Non-HDL Goal, POC: 62 NA
TChol/HDL Ratio (POC): 0.9
TChol/HDL Ratio (POC): 0.9 NA
Triglycerides (POC): 84
Triglycerides, POC: 84 NA

## 2017-10-12 LAB — AMB POC GLUCOSE BLOOD, BY GLUCOSE MONITORING DEVICE
Glucose POC: 174 mg/dL
Glucose, POC: 174 mg/dL

## 2017-10-12 LAB — AMB POC HEMOGLOBIN A1C
Hemoglobin A1C, POC: 6.3 %
Hemoglobin A1c (POC): 6.3 %

## 2017-10-12 NOTE — Progress Notes (Signed)
Chief Complaint   Patient presents with   ??? Follow-up     PE     1. Have you been to the ER, urgent care clinic since your last visit?  Hospitalized since your last visit?Yes When: 09/24/2017 [facility] ED for LE edema    2. Have you seen or consulted any other health care providers outside of the [facility] since your last visit?  Include any pap smears or colon screening. No

## 2017-10-12 NOTE — Progress Notes (Signed)
Allison Newman is a 77 y.o. female and presents with Follow-up (PE)  .  Subjective:  Pt comes-in for hospital d/c f/u.  Pt was admitted to [facility] 4/419-06/11/17 after presenting w SOB.  Pt was found to have bil PE and started on eliquis.  Pts only RF is her morbid obesity and sedentary lifestyle. Pt does NOT have a provoking factor.    Pt did not make an appt w sleep medicine as recommended at her last visit    Morbid obesity-  Wt Readings from Last 3 Encounters:   10/12/17 309 lb (140.2 kg)   08/28/17 306 lb (138.8 kg)   07/18/17 312 lb (141.5 kg)     CKD 2-  Lab Results   Component Value Date/Time    GFR est AA 50 (L) 09/24/2017 07:27 PM    GFR est non-AA 41 (L) 09/24/2017 07:27 PM    Creatinine (POC) 0.8 04/11/2013 11:28 AM    Creatinine 1.27 (H) 09/24/2017 07:27 PM    BUN 27 (H) 09/24/2017 07:27 PM    Sodium 143 09/24/2017 07:27 PM    Potassium 4.0 09/24/2017 07:27 PM    Chloride 111 (H) 09/24/2017 07:27 PM    CO2 24 09/24/2017 07:27 PM     Type 2 DM-controlled  Lab Results   Component Value Date/Time    Hemoglobin A1c 7.7 (H) 06/08/2017 04:05 AM    Hemoglobin A1c (POC) 6.3 10/12/2017 11:26 AM    Hemoglobin A1c, External 6.6 07/29/2014     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 [facility]    COPD/goiter/hyperlipidemia  Lab Results   Component Value Date/Time    Cholesterol, total 116 06/08/2017 04:00 AM    Cholesterol (POC) 116 10/12/2017 11:29 AM    HDL Cholesterol 50 06/08/2017 04:00 AM    HDL Cholesterol (POC) 54 10/12/2017 11:29 AM    LDL Cholesterol (POC) 46 10/12/2017 11:29 AM    LDL, calculated 46.2 06/08/2017 04:00 AM    VLDL, calculated 19.8 06/08/2017 04:00 AM    Triglyceride 99 06/08/2017 04:00 AM    Triglycerides (POC) 84 10/12/2017 11:29 AM    CHOL/HDL Ratio 2.3 06/08/2017 04:00 AM       Review of Systems  Review of systems (12) negative, except noted above.      Past Medical History:   Diagnosis Date   ??? Abdominal pain 05/08/2013   ??? Abnormal finding on EKG 11/17/2013   ??? ACP (advance care planning)  05/03/2015   ??? Acute pulmonary embolism (HCC) 06/08/2017   ??? Arthritis    ??? 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 Chronic pain of left knee 08/13/2014   ??? Chronic right hip pain 03/17/2013   ??? 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 Diabetes (HCC)    ??? Elevated BUN 09/13/2011   ??? Finger lesion 03/02/2016   ??? Gallbladder polyp 04/08/2013   ??? Gastrointestinal disorder     GERD   ??? GERD (gastroesophageal reflux disease)    ??? Hypercholesterolemia    ??? Hypertension    ??? 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 Microalbuminuria 08/29/2013   ??? 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 On aspirin at home 05/03/2015   ??? Other unknown and unspecified cause of morbidity or mortality     hx of bronchitis   ???  Rash, skin 09/25/2012   ??? 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 Slow transit constipation 08/13/2014   ??? Umbilical hernia 05/08/2013     Past Surgical History:   Procedure Laterality Date   ??? 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 HX ORTHOPAEDIC  2000    screw in foot left   ??? HX OTHER SURGICAL      colonoscopy   ??? HX TUBAL LIGATION       Social History     Socioeconomic History   ??? Marital status: DIVORCED     Spouse name: Not on file   ??? Number of children: Not on file   ??? Years of education: Not on file   ??? Highest education level: Not on file   Tobacco Use   ??? Smoking status: Former Smoker     Packs/day: 0.25     Years: 1.00     Pack years: 0.25     Types: Cigarettes     Last attempt to quit: 06/03/1991     Years since quitting: 26.3   ??? Smokeless tobacco: Never Used   Substance and Sexual Activity   ??? Alcohol use: No     Alcohol/week: 0.0 standard drinks   ??? Drug use: No   ??? Sexual activity: Never     Family History   Problem Relation Age of Onset   ??? Diabetes Mother    ??? Heart Disease Mother         rheumatic  fever   ??? Breast Cancer Mother    ??? Cancer Father         lung   ??? Cancer Sister         colon & breast   ??? Breast Cancer Sister    ??? Cancer Brother         throat     Current Outpatient Medications   Medication Sig Dispense Refill   ??? ELIQUIS 5 mg tablet TAKE 1 TABLET BY MOUTH TWICE DAILY 60 Tab 0   ??? methIMAzole (TAPAZOLE) 5 mg tablet TAKE 1 TABLET EVERY DAY 90 Tab 1   ??? glucose blood VI test strips (BLOOD GLUCOSE TEST) strip Use BID Dx11.9 TRUEMETRIX 100 Strip 11   ??? lisinopril (PRINIVIL, ZESTRIL) 10 mg tablet Take 1 Tab by mouth daily. 90 Tab 1   ??? metFORMIN (GLUCOPHAGE) 1,000 mg tablet Take 1 Tab by mouth two (2) times daily (with meals). 180 Tab 1   ??? metoprolol tartrate (LOPRESSOR) 50 mg tablet Take 1 Tab by mouth two (2) times a day. 180 Tab 1   ??? rosuvastatin (CRESTOR) 20 mg tablet Take 1 Tab by mouth nightly. 90 Tab 1   ??? triamterene-hydroCHLOROthiazide (DYAZIDE) 37.5-25 mg per capsule TAKE 1 CAPSULE BY MOUTH EVERY DAY 90 Cap 1   ??? BREO ELLIPTA 200-25 mcg/dose inhaler INHALE 1 PUFF BY MOUTH DAILY 1 Each 5   ??? lancets misc Use BID. Dx: E11.9 100 Each 11   ??? nystatin (MYCOSTATIN) powder Apply  to affected area four (4) times daily. 120 g 3   ??? CALCIUM 600 + D tablet take 1 tablet by mouth twice a day 60 Tab 11   ??? aspirin-calcium carbonate 81 mg-300 mg calcium(777 mg) tab Take 81 mg by mouth.     ??? aspirin delayed-release 81 mg tablet Take 1 Tab by mouth daily. 30 Tab 11   ??? oxyCODONE-acetaminophen (PERCOCET 10) 10-325 mg per tablet  take 1/2 TO 1 tablet once daily if needed for pain  0   ??? FREESTYLE LANCETS 28 gauge misc   1   ??? JANUMET 50-500 mg per tablet take 1 tablet by mouth once daily 30 Tab 1   ??? cyanocobalamin 1,000 mcg tablet Take 1,000 mcg by mouth daily.       Allergies   Allergen Reactions   ??? Almond Oil Anaphylaxis, Hives and Itching   ??? Nuts [Tree Nut] Anaphylaxis       Objective:  Visit Vitals  BP 139/78 (BP 1 Location: Left arm, BP Patient Position: Sitting)   Pulse 69   Temp 97.3 ??F (36.3  ??C) (Oral)   Resp 19   Ht 5\' 4"  (1.626 m)   Wt 309 lb (140.2 kg)   LMP  (LMP Unknown)   SpO2 93%   BMI 53.04 kg/m??     Physical Exam:   General appearance - alert, morbidly obese  Mental status - alert, oriented to person, place, and time  EYE-EOMI  Neck - supple, no significant adenopathy   Chest - clear to auscultation, distant BS  Heart - normal rate, regular rhythm, normal S1, S2, 2/6 systolic murmur  Abdomen - soft, nontender, nondistended, no masses or organomegaly  Ext-peripheral pulses normal, no pedal edema, no clubbing or cyanosis  Skin-erythematous, well demarcated, moist rash under left breast  Neuro -alert, oriented, normal speech, walks w walker, VERY SLOWLY    Results for orders placed or performed in visit on 10/12/17   AMB POC GLUCOSE BLOOD, BY GLUCOSE MONITORING DEVICE   Result Value Ref Range    Glucose POC 174 mg/dL   AMB POC HEMOGLOBIN [id number]   Result Value Ref Range    Hemoglobin A1c (POC) 6.3 %   AMB POC LIPID PROFILE   Result Value Ref Range    Cholesterol (POC) 116     Triglycerides (POC) 84     HDL Cholesterol (POC) 54     LDL Cholesterol (POC) 46 MG/DL    Non-HDL Goal (POC) 62     TChol/HDL Ratio (POC) 0.9        Assessment/Plan:    ICD-10-CM ICD-9-CM    1. Pulmonary embolism with acute cor pulmonale, unspecified chronicity, unspecified pulmonary embolism type (HCC) I26.09 415.19 REFERRAL TO HEMATOLOGY ONCOLOGY     415.0    2. Type 2 diabetes mellitus with stage 2 chronic kidney disease, without long-term current use of insulin (HCC) E11.22 250.40 AMB POC GLUCOSE BLOOD, BY GLUCOSE MONITORING DEVICE    N18.2 585.2 AMB POC HEMOGLOBIN A1C      AMB POC LIPID PROFILE   3. Morbid obesity (HCC) E66.01 278.01 REFERRAL TO SLEEP STUDIES      REFERRAL TO HEMATOLOGY ONCOLOGY   4. Snoring R06.83 786.09 REFERRAL TO SLEEP STUDIES   5. COPD (chronic obstructive pulmonary disease) with chronic bronchitis (HCC) J44.9 491.20    6. Chronic kidney disease (CKD), stage II (mild) N18.2 585.2    7.  Hypercholesterolemia E78.00 272.0    8. Essential hypertension I10 401.9      Orders Placed This Encounter   ??? REFERRAL TO SLEEP STUDIES     Referral Priority:   Routine     Referral Type:   Consultation     Referral Reason:   Specialty Services Required     Referred to Provider:   Romana JuniperBashir, Naim S, MD     Requested Specialty:   Sleep Medicine     Number of Visits Requested:  1   ??? REFERRAL TO HEMATOLOGY ONCOLOGY     Referral Priority:   Routine     Referral Type:   Consultation     Referral Reason:   Specialty Services Required     Referral Location:   MEDICAL ONCOLOGY [facility]     Referred to Provider:   Kathaleen Maser, MD     Number of Visits Requested:   1   ??? AMB POC GLUCOSE BLOOD, BY GLUCOSE MONITORING DEVICE   ??? AMB POC HEMOGLOBIN A1C   ??? AMB POC LIPID PROFILE     1. Pulmonary embolism with acute cor pulmonale, unspecified chronicity, unspecified pulmonary embolism type (HCC)  Will refer to heme for eval ? Lifelong anticoagulation given morbid obesity and sedentary lifestyle  - REFERRAL TO HEMATOLOGY ONCOLOGY    2. Type 2 diabetes mellitus with stage 2 chronic kidney disease, without long-term current use of insulin (HCC)  Controlled  - AMB POC GLUCOSE BLOOD, BY GLUCOSE MONITORING DEVICE  - AMB POC HEMOGLOBIN A1C  - AMB POC LIPID PROFILE    3. Morbid obesity (HCC)  Unchanged  - REFERRAL TO SLEEP STUDIES  - REFERRAL TO HEMATOLOGY ONCOLOGY    4. Snoring  Rerefer  - REFERRAL TO SLEEP STUDIES    5. COPD (chronic obstructive pulmonary disease) with chronic bronchitis (HCC)  Noted    6. Chronic kidney disease (CKD), stage II (mild)Noted    7. Hypercholesterolemia  Controlled    8. Essential hypertension  Controlled          There are no Patient Instructions on file for this visit.   Follow-up and Dispositions    ?? Return in about 3 months (around 01/12/2018) for DM f/u.           I have reviewed with the patient details of the assessment and plan and all questions were answered. Relevent patient education was  performed.The most recent lab findings were reviewed with the patient.    An After Visit Summary was printed and given to the patient.

## 2017-10-12 NOTE — Progress Notes (Signed)
 Chief Complaint   Patient presents with   . Follow-up     PE     1. Have you been to the ER, urgent care clinic since your last visit?  Hospitalized since your last visit?Yes When: 09/24/2017 [facility] ED for LE edema    2. Have you seen or consulted any other health care providers outside of the [facility] since your last visit?  Include any pap smears or colon screening. No

## 2017-10-12 NOTE — Progress Notes (Signed)
Allison Newman is a 77 y.o. female and presents with Follow-up (PE)  .  Subjective:  Pt comes-in for hospital d/c f/u.  Pt was admitted to [facility] 4/419-06/11/17 after presenting w SOB.  Pt was found to have bil PE and started on eliquis.  Pts only RF is her morbid obesity and sedentary lifestyle. Pt does NOT have a provoking factor.    Pt did not make an appt w sleep medicine as recommended at her last visit    Morbid obesity-  Wt Readings from Last 3 Encounters:   10/12/17 309 lb (140.2 kg)   08/28/17 306 lb (138.8 kg)   07/18/17 312 lb (141.5 kg)     CKD 2-  Lab Results   Component Value Date/Time    GFR est AA 50 (L) 09/24/2017 07:27 PM    GFR est non-AA 41 (L) 09/24/2017 07:27 PM    Creatinine (POC) 0.8 04/11/2013 11:28 AM    Creatinine 1.27 (H) 09/24/2017 07:27 PM    BUN 27 (H) 09/24/2017 07:27 PM    Sodium 143 09/24/2017 07:27 PM    Potassium 4.0 09/24/2017 07:27 PM    Chloride 111 (H) 09/24/2017 07:27 PM    CO2 24 09/24/2017 07:27 PM     Type 2 DM-controlled  Lab Results   Component Value Date/Time    Hemoglobin A1c 7.7 (H) 06/08/2017 04:05 AM    Hemoglobin A1c (POC) 6.3 10/12/2017 11:26 AM    Hemoglobin A1c, External 6.6 07/29/2014     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 Pain [facility]    COPD/goiter/hyperlipidemia  Lab Results   Component Value Date/Time    Cholesterol, total 116 06/08/2017 04:00 AM    Cholesterol (POC) 116 10/12/2017 11:29 AM    HDL Cholesterol 50 06/08/2017 04:00 AM    HDL Cholesterol (POC) 54 10/12/2017 11:29 AM    LDL Cholesterol (POC) 46 10/12/2017 11:29 AM    LDL, calculated 46.2 06/08/2017 04:00 AM    VLDL, calculated 19.8 06/08/2017 04:00 AM    Triglyceride 99 06/08/2017 04:00 AM    Triglycerides (POC) 84 10/12/2017 11:29 AM    CHOL/HDL Ratio 2.3 06/08/2017 04:00 AM       Review of Systems  Review of systems (12) negative, except noted above.      Past Medical History:   Diagnosis Date   ??? Abdominal pain 05/08/2013   ??? Abnormal finding on EKG 11/17/2013    ??? ACP (advance care planning) 05/03/2015   ??? Acute pulmonary embolism (HCC) 06/08/2017   ??? Arthritis    ??? 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 Chronic pain of left knee 08/13/2014   ??? Chronic right hip pain 03/17/2013   ??? 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 Diabetes (HCC)    ??? Elevated BUN 09/13/2011   ??? Finger lesion 03/02/2016   ??? Gallbladder polyp 04/08/2013   ??? Gastrointestinal disorder     GERD   ??? GERD (gastroesophageal reflux disease)    ??? Hypercholesterolemia    ??? Hypertension    ??? 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 Microalbuminuria 08/29/2013   ??? 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 On aspirin at home 05/03/2015   ??? Other unknown and unspecified cause of morbidity or mortality     hx of bronchitis   ???  Rash, skin 09/25/2012   ??? 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 Slow transit constipation 08/13/2014   ??? Umbilical hernia 05/08/2013     Past Surgical History:   Procedure Laterality Date   ??? 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 HX ORTHOPAEDIC  2000    screw in foot left   ??? HX OTHER SURGICAL      colonoscopy   ??? HX TUBAL LIGATION       Social History     Socioeconomic History   ??? Marital status: DIVORCED     Spouse name: Not on file   ??? Number of children: Not on file   ??? Years of education: Not on file   ??? Highest education level: Not on file   Tobacco Use   ??? Smoking status: Former Smoker     Packs/day: 0.25     Years: 1.00     Pack years: 0.25     Types: Cigarettes     Last attempt to quit: 06/03/1991     Years since quitting: 26.3   ??? Smokeless tobacco: Never Used   Substance and Sexual Activity   ??? Alcohol use: No     Alcohol/week: 0.0 standard drinks   ??? Drug use: No   ??? Sexual activity: Never     Family History   Problem Relation Age of Onset   ??? Diabetes Mother     ??? Heart Disease Mother         rheumatic fever   ??? Breast Cancer Mother    ??? Cancer Father         lung   ??? Cancer Sister         colon & breast   ??? Breast Cancer Sister    ??? Cancer Brother         throat     Current Outpatient Medications   Medication Sig Dispense Refill   ??? ELIQUIS 5 mg tablet TAKE 1 TABLET BY MOUTH TWICE DAILY 60 Tab 0   ??? methIMAzole (TAPAZOLE) 5 mg tablet TAKE 1 TABLET EVERY DAY 90 Tab 1   ??? glucose blood VI test strips (BLOOD GLUCOSE TEST) strip Use BID Dx11.9 TRUEMETRIX 100 Strip 11   ??? lisinopril (PRINIVIL, ZESTRIL) 10 mg tablet Take 1 Tab by mouth daily. 90 Tab 1   ??? metFORMIN (GLUCOPHAGE) 1,000 mg tablet Take 1 Tab by mouth two (2) times daily (with meals). 180 Tab 1   ??? metoprolol tartrate (LOPRESSOR) 50 mg tablet Take 1 Tab by mouth two (2) times a day. 180 Tab 1   ??? rosuvastatin (CRESTOR) 20 mg tablet Take 1 Tab by mouth nightly. 90 Tab 1   ??? triamterene-hydroCHLOROthiazide (DYAZIDE) 37.5-25 mg per capsule TAKE 1 CAPSULE BY MOUTH EVERY DAY 90 Cap 1   ??? BREO ELLIPTA 200-25 mcg/dose inhaler INHALE 1 PUFF BY MOUTH DAILY 1 Each 5   ??? lancets misc Use BID. Dx: E11.9 100 Each 11   ??? nystatin (MYCOSTATIN) powder Apply  to affected area four (4) times daily. 120 g 3   ??? CALCIUM 600 + D tablet take 1 tablet by mouth twice a day 60 Tab 11   ??? aspirin-calcium carbonate 81 mg-300 mg calcium(777 mg) tab Take 81 mg by mouth.     ??? aspirin delayed-release 81 mg tablet Take 1 Tab by mouth daily. 30 Tab 11   ??? oxyCODONE-acetaminophen (PERCOCET 10) 10-325 mg per tablet  take 1/2 TO 1 tablet once daily if needed for pain  0   ??? FREESTYLE LANCETS 28 gauge misc   1   ??? JANUMET 50-500 mg per tablet take 1 tablet by mouth once daily 30 Tab 1   ??? cyanocobalamin 1,000 mcg tablet Take 1,000 mcg by mouth daily.       Allergies   Allergen Reactions   ??? Almond Oil Anaphylaxis, Hives and Itching   ??? Nuts [Tree Nut] Anaphylaxis       Objective:  Visit Vitals   BP 139/78 (BP 1 Location: Left arm, BP Patient Position: Sitting)   Pulse 69   Temp 97.3 ??F (36.3 ??C) (Oral)   Resp 19   Ht 5\' 4"  (1.626 m)   Wt 309 lb (140.2 kg)   LMP  (LMP Unknown)   SpO2 93%   BMI 53.04 kg/m??     Physical Exam:   General appearance - alert, morbidly obese  Mental status - alert, oriented to person, place, and time  EYE-EOMI  Neck - supple, no significant adenopathy   Chest - clear to auscultation, distant BS  Heart - normal rate, regular rhythm, normal S1, S2, 2/6 systolic murmur  Abdomen - soft, nontender, nondistended, no masses or organomegaly  Ext-peripheral pulses normal, no pedal edema, no clubbing or cyanosis  Skin-erythematous, well demarcated, moist rash under left breast  Neuro -alert, oriented, normal speech, walks w walker, VERY SLOWLY    Results for orders placed or performed in visit on 10/12/17   AMB POC GLUCOSE BLOOD, BY GLUCOSE MONITORING DEVICE   Result Value Ref Range    Glucose POC 174 mg/dL   AMB POC HEMOGLOBIN [id number]   Result Value Ref Range    Hemoglobin A1c (POC) 6.3 %   AMB POC LIPID PROFILE   Result Value Ref Range    Cholesterol (POC) 116     Triglycerides (POC) 84     HDL Cholesterol (POC) 54     LDL Cholesterol (POC) 46 MG/DL    Non-HDL Goal (POC) 62     TChol/HDL Ratio (POC) 0.9        Assessment/Plan:    ICD-10-CM ICD-9-CM    1. Pulmonary embolism with acute cor pulmonale, unspecified chronicity, unspecified pulmonary embolism type (HCC) I26.09 415.19 REFERRAL TO HEMATOLOGY ONCOLOGY     415.0    2. Type 2 diabetes mellitus with stage 2 chronic kidney disease, without long-term current use of insulin (HCC) E11.22 250.40 AMB POC GLUCOSE BLOOD, BY GLUCOSE MONITORING DEVICE    N18.2 585.2 AMB POC HEMOGLOBIN A1C      AMB POC LIPID PROFILE   3. Morbid obesity (HCC) E66.01 278.01 REFERRAL TO SLEEP STUDIES      REFERRAL TO HEMATOLOGY ONCOLOGY   4. Snoring R06.83 786.09 REFERRAL TO SLEEP STUDIES   5. COPD (chronic obstructive pulmonary disease) with chronic bronchitis  (HCC) J44.9 491.20    6. Chronic kidney disease (CKD), stage II (mild) N18.2 585.2    7. Hypercholesterolemia E78.00 272.0    8. Essential hypertension I10 401.9      Orders Placed This Encounter   ??? REFERRAL TO SLEEP STUDIES     Referral Priority:   Routine     Referral Type:   Consultation     Referral Reason:   Specialty Services Required     Referred to Provider:   Romana JuniperBashir, Naim S, MD     Requested Specialty:   Sleep Medicine     Number of Visits Requested:  1   ??? REFERRAL TO HEMATOLOGY ONCOLOGY     Referral Priority:   Routine     Referral Type:   Consultation     Referral Reason:   Specialty Services Required     Referral Location:   MEDICAL ONCOLOGY [facility]     Referred to Provider:   Kathaleen Maser, MD     Number of Visits Requested:   1   ??? AMB POC GLUCOSE BLOOD, BY GLUCOSE MONITORING DEVICE   ??? AMB POC HEMOGLOBIN A1C   ??? AMB POC LIPID PROFILE     1. Pulmonary embolism with acute cor pulmonale, unspecified chronicity, unspecified pulmonary embolism type (HCC)  Will refer to heme for eval ? Lifelong anticoagulation given morbid obesity and sedentary lifestyle  - REFERRAL TO HEMATOLOGY ONCOLOGY    2. Type 2 diabetes mellitus with stage 2 chronic kidney disease, without long-term current use of insulin (HCC)  Controlled  - AMB POC GLUCOSE BLOOD, BY GLUCOSE MONITORING DEVICE  - AMB POC HEMOGLOBIN A1C  - AMB POC LIPID PROFILE    3. Morbid obesity (HCC)  Unchanged  - REFERRAL TO SLEEP STUDIES  - REFERRAL TO HEMATOLOGY ONCOLOGY    4. Snoring  Rerefer  - REFERRAL TO SLEEP STUDIES    5. COPD (chronic obstructive pulmonary disease) with chronic bronchitis (HCC)  Noted    6. Chronic kidney disease (CKD), stage II (mild)Noted    7. Hypercholesterolemia  Controlled    8. Essential hypertension  Controlled          There are no Patient Instructions on file for this visit.   Follow-up and Dispositions    ?? Return in about 3 months (around 01/12/2018) for DM f/u.            I have reviewed with the patient details of the assessment and plan and all questions were answered. Relevent patient education was performed.The most recent lab findings were reviewed with the patient.    An After Visit Summary was printed and given to the patient.

## 2017-10-15 ENCOUNTER — Encounter

## 2017-10-15 MED ORDER — METFORMIN 1,000 MG TAB
1000 mg | ORAL_TABLET | ORAL | 1 refills | Status: DC
Start: 2017-10-15 — End: 2018-03-26

## 2017-10-15 MED ORDER — ROSUVASTATIN 20 MG TAB
20 mg | ORAL_TABLET | ORAL | 1 refills | Status: DC
Start: 2017-10-15 — End: 2018-03-26

## 2017-10-29 ENCOUNTER — Encounter

## 2017-10-30 MED ORDER — TRIAMTERENE-HYDROCHLOROTHIAZIDE 37.5 MG-25 MG CAP
ORAL_CAPSULE | ORAL | 1 refills | Status: AC
Start: 2017-10-30 — End: ?

## 2017-10-30 MED ORDER — METOPROLOL TARTRATE 50 MG TAB
50 mg | ORAL_TABLET | ORAL | 1 refills | Status: DC
Start: 2017-10-30 — End: 2018-05-10

## 2017-10-30 MED ORDER — LISINOPRIL 10 MG TAB
10 mg | ORAL_TABLET | ORAL | 1 refills | Status: AC
Start: 2017-10-30 — End: ?

## 2017-11-02 ENCOUNTER — Encounter

## 2017-11-06 MED ORDER — ELIQUIS 5 MG TABLET
5 mg | ORAL_TABLET | ORAL | 0 refills | Status: DC
Start: 2017-11-06 — End: 2017-12-06

## 2017-11-07 ENCOUNTER — Ambulatory Visit: Attending: Internal Medicine | Primary: Geriatric Medicine

## 2017-11-07 ENCOUNTER — Ambulatory Visit: Admit: 2017-11-07 | Discharge: 2017-11-07 | Payer: MEDICARE | Attending: Internal Medicine | Primary: Geriatric Medicine

## 2017-11-07 DIAGNOSIS — I2699 Other pulmonary embolism without acute cor pulmonale: Secondary | ICD-10-CM

## 2017-11-07 NOTE — Progress Notes (Signed)
Allison Newman is a 77 y.o. female  Chief Complaint   Patient presents with   ??? New Patient     PE     1. Have you been to the ER, urgent care clinic since your last visit?  Hospitalized since your last visit?No    2. Have you seen or consulted any other health care providers outside of the [facility] since your last visit?  Include any pap smears or colon screening. No

## 2017-11-07 NOTE — Progress Notes (Signed)
Progress  Notes by Juliene Pina, MD at 11/07/17 0800                Author: Juliene Pina, MD  Service: --  Author Type: Physician       Filed: 11/07/17 0858  Encounter Date: 11/07/2017  Status: Signed          Editor: Juliene Pina, MD (Physician)                       [place] [facility]   [place], [place] [address]   W: [phone]   F: [phone]        Reason for Visit:     Allison Newman is a 77 y.o.  female who is seen in consultation at the request of Dr. Vira Agar for evaluation of Pulmonary embolism        Treatment History:     ??  None         History of Present Illness:     Patient is a 77 y.o. female seen for evaluation of PE      She presented with progressive SOB on taking stairs for 2 days prior to presented to the ER on 06/07/17. CTA on 06/07/17 showed multiple pulmonary emboli, Duplex negative. She was started on Eliquis.She had no preceding surgery, she does not recall any falls,  trauma, travel, illnesses, was using a walker for ambulation as usual. She has no personal of FH of clots. No clotting after pregnancies.       She is on Eliquis and is compliant, has no bleeding,  No changes in BM, she last had a colonoscopy 10 years ago. She has constipation. She has no blood in stool. Breathing has improved. 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 She has no  change in weight or appetite. Has a h/o R knee replacement. No changes in urination. She gets her mammograms on time.       She does not smoke nor uses Alcohol      Sister breast and colon cancer   Father had lung cancer   Brother throat cancer        Past Medical History:        Diagnosis  Date         ?  Abdominal pain  05/08/2013     ?  Abnormal finding on EKG  11/17/2013     ?  ACP (advance care planning)  05/03/2015     ?  Acute pulmonary embolism ([place])  06/08/2017     ?  Arthritis       ?  Chronic kidney disease (CKD), stage II (mild)  03/02/2016     ?  Chronic pain disorder   06/04/2013     ?  Chronic pain of left knee  08/13/2014     ?  Chronic right hip pain  03/17/2013     ?  COPD (chronic obstructive pulmonary disease) with chronic bronchitis ([place])  04/19/2016     ?  Diabetes ([place])       ?  Elevated BUN  09/13/2011     ?  Finger lesion  03/02/2016     ?  Gallbladder polyp  04/08/2013     ?  Gastrointestinal disorder            GERD         ?  GERD (gastroesophageal reflux disease)       ?  Hypercholesterolemia       ?  Hypertension       ?  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  Microalbuminuria  08/29/2013     ?  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, adult ([place])  08/28/2011     ?  Neuropathic arthritis  08/28/2011     ?  Neuropathic arthritis  08/28/2011     ?  On aspirin at home  05/03/2015     ?  Other unknown and unspecified cause of morbidity or mortality            hx of bronchitis         ?  Rash, skin  09/25/2012     ?  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  Slow transit constipation  08/13/2014         ?  Umbilical hernia  4/0/9811           Past Surgical History:         Procedure  Laterality  Date          ?  HX ORTHOPAEDIC    1998          knee replacement right          ?  HX ORTHOPAEDIC    2000          screw in foot left          ?  HX OTHER SURGICAL              colonoscopy          ?  HX TUBAL LIGATION               Social History          Tobacco Use         ?  Smoking status:  Former Smoker              Packs/day:  0.25         Years:  1.00         Pack years:  0.25         Types:  Cigarettes         Last attempt to quit:  06/03/1991         Years since quitting:  26.4         ?  Smokeless tobacco:  Never Used       Substance Use Topics         ?  Alcohol use:  No              Alcohol/week:  0.0 standard drinks           Family History         Problem  Relation  Age of Onset          ?  Diabetes  Mother       ?  Heart Disease  Mother                rheumatic fever          ?  Breast Cancer  Mother       ?  Cancer  Father                lung          ?  Cancer   Sister  colon & breast          ?  Breast Cancer  Sister       ?  Cancer  Brother                throat          Current Outpatient Medications        Medication  Sig         ?  ELIQUIS 5 mg tablet  TAKE 1 TABLET BY MOUTH TWICE DAILY     ?  triamterene-hydroCHLOROthiazide (DYAZIDE) 37.5-25 mg per capsule  TAKE 1 CAPSULE EVERY DAY     ?  lisinopril (PRINIVIL, ZESTRIL) 10 mg tablet  TAKE 1 TABLET EVERY DAY     ?  metoprolol tartrate (LOPRESSOR) 50 mg tablet  TAKE 1 TABLET TWICE DAILY     ?  rosuvastatin (CRESTOR) 20 mg tablet  TAKE 1 TABLET EVERY NIGHT     ?  metFORMIN (GLUCOPHAGE) 1,000 mg tablet  TAKE 1 TABLET TWICE DAILY WITH MEALS     ?  methIMAzole (TAPAZOLE) 5 mg tablet  TAKE 1 TABLET EVERY DAY     ?  glucose blood VI test strips (BLOOD GLUCOSE TEST) strip  Use BID Dx11.9 TRUEMETRIX     ?  BREO ELLIPTA 200-25 mcg/dose inhaler  INHALE 1 PUFF BY MOUTH DAILY     ?  lancets misc  Use BID. Dx: E11.9     ?  nystatin (MYCOSTATIN) powder  Apply  to affected area four (4) times daily.     ?  CALCIUM 600 + D tablet  take 1 tablet by mouth twice a day     ?  aspirin-calcium carbonate 81 mg-300 mg calcium(777 mg) tab  Take 81 mg by mouth.     ?  aspirin delayed-release 81 mg tablet  Take 1 Tab by mouth daily.     ?  oxyCODONE-acetaminophen (PERCOCET 10) 10-325 mg per tablet  take 1/2 TO 1 tablet once daily if needed for pain     ?  FREESTYLE LANCETS 28 gauge misc           ?  cyanocobalamin 1,000 mcg tablet  Take 1,000 mcg by mouth daily.          No current facility-administered medications for this visit.            Allergies        Allergen  Reactions         ?  Almond Oil  Anaphylaxis, Hives and Itching         ?  Nuts [Tree Nut]  Anaphylaxis            Review of Systems: A complete review of systems was obtained, negative except as described above.        Physical Exam:        Visit Vitals      Resp  16     Ht  5' 4" (1.626 m)     Wt  310 lb 1.6 oz (140.7 kg)     LMP   (LMP Unknown)        BMI  53.23  kg/m??        ECOG PS: 2   General: No distress , uses a walker   Eyes:  PERRLA, anicteric sclerae   HENT: Atraumatic, OP clear   Neck: Supple   Lymphatic: No cervical, supraclavicular, or inguinal adenopathy   Respiratory: CTAB, normal respiratory effort  CV: Normal rate, regular rhythm, no murmurs, no peripheral edema   GI: Soft, nontender, nondistended, no masses, no hepatomegaly, no splenomegaly   MS: Normal gait and station. Digits without clubbing or cyanosis.She has  bilateral ankle edema   Skin: No rashes, ecchymoses, or petechiae. Normal temperature, turgor, and texture.   Psych: Alert, oriented, appropriate affect, normal judgment/insight        Results:          Lab Results         Component  Value  Date/Time            WBC  6.0  09/24/2017 07:27 PM       HGB  11.8  09/24/2017 07:27 PM       HCT  38.2  09/24/2017 07:27 PM       PLATELET  150  09/24/2017 07:27 PM       MCV  94.6  09/24/2017 07:27 PM            ABS. NEUTROPHILS  4.1  09/24/2017 07:27 PM          Lab Results         Component  Value  Date/Time            Sodium  143  09/24/2017 07:27 PM       Potassium  4.0  09/24/2017 07:27 PM       Chloride  111 (H)  09/24/2017 07:27 PM       CO2  24  09/24/2017 07:27 PM       Glucose  128 (H)  09/24/2017 07:27 PM       BUN  27 (H)  09/24/2017 07:27 PM       Creatinine  1.27 (H)  09/24/2017 07:27 PM       GFR est AA  50 (L)  09/24/2017 07:27 PM       GFR est non-AA  41 (L)  09/24/2017 07:27 PM       Calcium  8.9  09/24/2017 07:27 PM       Glucose (POC)  187 (H)  06/11/2017 11:39 AM       Glucose POC  174  10/12/2017 11:24 AM            Creatinine (POC)  0.8  04/11/2013 11:28 AM          Lab Results         Component  Value  Date/Time            Bilirubin, total  0.3  09/24/2017 07:27 PM       ALT (SGPT)  16  09/24/2017 07:27 PM       AST (SGOT)  14 (L)  09/24/2017 07:27 PM       Alk. phosphatase  57  09/24/2017 07:27 PM       Protein, total  6.7  09/24/2017 07:27 PM       Albumin  3.0 (L)  09/24/2017 07:27  PM            Globulin  3.7  09/24/2017 07:27 PM             Lab Results         Component  Value  Date/Time            TSH  1.33  06/08/2017 03:50 AM            Lipase  122  12/27/2010 06:50 PM  No results found for: INR, APTT, DDIMSQ, DDIME, [phone], Okey Regal, FDPLT, Judy Pimple, [place], [id number],  [id number], [id number], [id number], [id number], [phone]      Records reviewed and summarized above.   Pathology report(s) reviewed above.   Radiology report(s) reviewed above.   Reviewed CTA and dopplers        Assessment:     1) Pulmonary embolism      Unprovoked PE 06/2017   She has multiple weak risk factors such as morbid obesity, sedentary life style but no clear risk factor that triggered this event      Estimated risk of recurrence following cessation of anticoagulation in patients with a first unprovoked episode of VTE is 10 percent at one year and 30 percent at five years (approximately 5 percent per year after the first year)      Per ACCP guidelines recommend indefinite anticoagulation as long as benefits out weighs risk      2) COPD   Per PCP      3) DM   Per PCP      4) Morbid obesity   Talked about life style changes      5) HM   She is uptodate with mammogram   She should get a colonoscopy with Dr. Elisha Ponder           Plan:        ??  Eliquis 5 mg BID indefinitely   ??  She should have a creatinine checked with PCP every 3-6 months and adjust Eliquis doses if indicated   ??  She states that she will call Dr. Dayton Bailiff office to set up a follow up for routine screening colonoscopy   ??  RTC 6 months      All questions answered. She had many unrelated concerns.      I appreciate the opportunity to participate in Allison Newman [facility] care.         Signed By:  Juliene Pina, MD

## 2017-11-07 NOTE — Progress Notes (Signed)
 Allison Newman is a 77 y.o. female  Chief Complaint   Patient presents with   . New Patient     PE     1. Have you been to the ER, urgent care clinic since your last visit?  Hospitalized since your last visit?No    2. Have you seen or consulted any other health care providers outside of the [facility] since your last visit?  Include any pap smears or colon screening. No

## 2017-11-07 NOTE — Addendum Note (Signed)
Addendum Note by Sharman Cheek A at 11/07/17 0800                Author: Sharman Cheek A  Service: --  Author Type: --       Filed: 11/30/17 1556  Encounter Date: 11/07/2017  Status: Signed          Editor: Sharman Cheek A          Addended by: Sharman Cheek A on: 11/30/2017 03:56 PM    Modules accepted: Level of Service

## 2017-11-07 NOTE — Addendum Note (Signed)
Addended by: Sharman Cheek A on: 11/30/2017 03:56 PM     Modules accepted: Level of Service

## 2017-11-07 NOTE — Progress Notes (Signed)
[facility]  [place], [place] [address]  W: [phone]  F: [phone]    Reason for Visit:   Allison Newman is a 77 y.o. female who is seen in consultation at the request of Dr. Vira Agar for evaluation of Pulmonary embolism    Treatment History:   ?? None     History of Present Illness:   Patient is a 77 y.o. female seen for evaluation of PE    She presented with progressive SOB on taking stairs for 2 days prior to presented to the ER on 06/07/17. CTA on 06/07/17 showed multiple pulmonary emboli, Duplex negative. She was started on Eliquis.She had no preceding surgery, she does not recall any falls, trauma, travel, illnesses, was using a walker for ambulation as usual. She has no personal of FH of clots. No clotting after pregnancies.     She is on Eliquis and is compliant, has no bleeding,  No changes in BM, she last had a colonoscopy 10 years ago. She has constipation. She has no blood in stool. Breathing has improved. 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 She has no change in weight or appetite. Has a h/o R knee replacement. No changes in urination. She gets her mammograms on time.     She does not smoke nor uses Alcohol    Sister breast and colon cancer  Father had lung cancer  Brother throat cancer    Past Medical History:   Diagnosis Date   ??? Abdominal pain 05/08/2013   ??? Abnormal finding on EKG 11/17/2013   ??? ACP (advance care planning) 05/03/2015   ??? Acute pulmonary embolism ([place]) 06/08/2017   ??? Arthritis    ??? 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 Chronic pain of left knee 08/13/2014   ??? Chronic right hip pain 03/17/2013   ??? COPD (chronic obstructive pulmonary disease) with chronic bronchitis ([place]) 04/19/2016   ??? Diabetes ([place])    ??? Elevated BUN 09/13/2011   ??? Finger lesion 03/02/2016   ??? Gallbladder polyp 04/08/2013   ??? Gastrointestinal disorder     GERD    ??? GERD (gastroesophageal reflux disease)    ??? Hypercholesterolemia    ??? Hypertension    ??? 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 Microalbuminuria 08/29/2013   ??? 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, adult ([place]) 08/28/2011   ??? 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 On aspirin at home 05/03/2015   ??? Other unknown and unspecified cause of morbidity or mortality     hx of bronchitis   ??? Rash, skin 09/25/2012   ??? 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 Slow transit constipation 0/08/2692   ??? Umbilical hernia 10/08/4625      Past Surgical History:   Procedure Laterality Date   ??? 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 HX ORTHOPAEDIC  2000    screw in foot left   ??? HX OTHER SURGICAL      colonoscopy   ??? HX TUBAL LIGATION        Social History     Tobacco Use   ??? Smoking status: Former Smoker     Packs/day: 0.25     Years: 1.00     Pack years: 0.25     Types: Cigarettes  Last attempt to quit: 06/03/1991     Years since quitting: 26.4   ??? Smokeless tobacco: Never Used   Substance Use Topics   ??? Alcohol use: No     Alcohol/week: 0.0 standard drinks      Family History   Problem Relation Age of Onset   ??? Diabetes Mother    ??? Heart Disease Mother         rheumatic fever   ??? Breast Cancer Mother    ??? Cancer Father         lung   ??? Cancer Sister         colon & breast   ??? Breast Cancer Sister    ??? Cancer Brother         throat     Current Outpatient Medications   Medication Sig   ??? ELIQUIS 5 mg tablet TAKE 1 TABLET BY MOUTH TWICE DAILY   ??? triamterene-hydroCHLOROthiazide (DYAZIDE) 37.5-25 mg per capsule TAKE 1 CAPSULE EVERY DAY   ??? lisinopril (PRINIVIL, ZESTRIL) 10 mg tablet TAKE 1 TABLET EVERY DAY   ??? metoprolol tartrate (LOPRESSOR) 50 mg tablet TAKE 1 TABLET TWICE DAILY   ??? rosuvastatin (CRESTOR) 20 mg tablet TAKE 1 TABLET EVERY NIGHT   ??? metFORMIN (GLUCOPHAGE) 1,000 mg tablet TAKE 1 TABLET TWICE DAILY WITH MEALS   ??? methIMAzole (TAPAZOLE) 5 mg tablet TAKE 1 TABLET EVERY DAY    ??? glucose blood VI test strips (BLOOD GLUCOSE TEST) strip Use BID Dx11.9 TRUEMETRIX   ??? BREO ELLIPTA 200-25 mcg/dose inhaler INHALE 1 PUFF BY MOUTH DAILY   ??? lancets misc Use BID. Dx: E11.9   ??? nystatin (MYCOSTATIN) powder Apply  to affected area four (4) times daily.   ??? CALCIUM 600 + D tablet take 1 tablet by mouth twice a day   ??? aspirin-calcium carbonate 81 mg-300 mg calcium(777 mg) tab Take 81 mg by mouth.   ??? aspirin delayed-release 81 mg tablet Take 1 Tab by mouth daily.   ??? oxyCODONE-acetaminophen (PERCOCET 10) 10-325 mg per tablet take 1/2 TO 1 tablet once daily if needed for pain   ??? FREESTYLE LANCETS 28 gauge misc    ??? cyanocobalamin 1,000 mcg tablet Take 1,000 mcg by mouth daily.     No current facility-administered medications for this visit.       Allergies   Allergen Reactions   ??? Almond Oil Anaphylaxis, Hives and Itching   ??? Nuts [Tree Nut] Anaphylaxis        Review of Systems: A complete review of systems was obtained, negative except as described above.    Physical Exam:     Visit Vitals  Resp 16   Ht 5' 4"  (1.626 m)   Wt 310 lb 1.6 oz (140.7 kg)   LMP  (LMP Unknown)   BMI 53.23 kg/m??     ECOG PS: 2  General: No distress , uses a walker  Eyes: PERRLA, anicteric sclerae  HENT: Atraumatic, OP clear  Neck: Supple  Lymphatic: No cervical, supraclavicular, or inguinal adenopathy  Respiratory: CTAB, normal respiratory effort  CV: Normal rate, regular rhythm, no murmurs, no peripheral edema  GI: Soft, nontender, nondistended, no masses, no hepatomegaly, no splenomegaly  MS: Normal gait and station. Digits without clubbing or cyanosis.She has bilateral ankle edema  Skin: No rashes, ecchymoses, or petechiae. Normal temperature, turgor, and texture.  Psych: Alert, oriented, appropriate affect, normal judgment/insight    Results:     Lab Results   Component Value Date/Time  WBC 6.0 09/24/2017 07:27 PM    HGB 11.8 09/24/2017 07:27 PM    HCT 38.2 09/24/2017 07:27 PM    PLATELET 150 09/24/2017 07:27 PM     MCV 94.6 09/24/2017 07:27 PM    ABS. NEUTROPHILS 4.1 09/24/2017 07:27 PM     Lab Results   Component Value Date/Time    Sodium 143 09/24/2017 07:27 PM    Potassium 4.0 09/24/2017 07:27 PM    Chloride 111 (H) 09/24/2017 07:27 PM    CO2 24 09/24/2017 07:27 PM    Glucose 128 (H) 09/24/2017 07:27 PM    BUN 27 (H) 09/24/2017 07:27 PM    Creatinine 1.27 (H) 09/24/2017 07:27 PM    GFR est AA 50 (L) 09/24/2017 07:27 PM    GFR est non-AA 41 (L) 09/24/2017 07:27 PM    Calcium 8.9 09/24/2017 07:27 PM    Glucose (POC) 187 (H) 06/11/2017 11:39 AM    Glucose POC 174 10/12/2017 11:24 AM    Creatinine (POC) 0.8 04/11/2013 11:28 AM     Lab Results   Component Value Date/Time    Bilirubin, total 0.3 09/24/2017 07:27 PM    ALT (SGPT) 16 09/24/2017 07:27 PM    AST (SGOT) 14 (L) 09/24/2017 07:27 PM    Alk. phosphatase 57 09/24/2017 07:27 PM    Protein, total 6.7 09/24/2017 07:27 PM    Albumin 3.0 (L) 09/24/2017 07:27 PM    Globulin 3.7 09/24/2017 07:27 PM       Lab Results   Component Value Date/Time    TSH 1.33 06/08/2017 03:50 AM    Lipase 122 12/27/2010 06:50 PM     No results found for: INR, APTT, DDIMSQ, DDIME, [id number], Okey Regal, FDPLT, [place] Nakayama, [place], [place], [place], [id number], [id number], [id number], [id number], [phone], [phone]    Records reviewed and summarized above.  Pathology report(s) reviewed above.  Radiology report(s) reviewed above.  Reviewed CTA and dopplers    Assessment:   1) Pulmonary embolism    Unprovoked PE 06/2017  She has multiple weak risk factors such as morbid obesity, sedentary life style but no clear risk factor that triggered this event    Estimated risk of recurrence following cessation of anticoagulation in patients with a first unprovoked episode of VTE is 10 percent at one year and 30 percent at five years (approximately 5 percent per year after the first year)    Per ACCP guidelines recommend indefinite anticoagulation as long as benefits out weighs risk    2) COPD  Per PCP    3) DM   Per PCP    4) Morbid obesity  Talked about life style changes    5) HM  She is uptodate with mammogram  She should get a colonoscopy with Dr. Elisha Ponder      Plan:     ?? Eliquis 5 mg BID indefinitely  ?? She should have a creatinine checked with PCP every 3-6 months and adjust Eliquis doses if indicated  ?? She states that she will call Dr. [place] Bailiff office to set up a follow up for routine screening colonoscopy  ?? RTC 6 months    All questions answered. She had many unrelated concerns.    I appreciate the opportunity to participate in Ms. Allison Newman [facility] care.    Signed By: Juliene Pina, MD

## 2017-11-08 DIAGNOSIS — K219 Gastro-esophageal reflux disease without esophagitis: Secondary | ICD-10-CM | POA: Diagnosis not present

## 2017-11-08 DIAGNOSIS — Z86010 Personal history of colonic polyps: Secondary | ICD-10-CM | POA: Diagnosis not present

## 2017-11-19 DIAGNOSIS — R69 Illness, unspecified: Secondary | ICD-10-CM | POA: Diagnosis not present

## 2017-12-01 ENCOUNTER — Encounter

## 2017-12-03 MED ORDER — BREO ELLIPTA 200 MCG-25 MCG/DOSE POWDER FOR INHALATION
200-25 mcg/dose | RESPIRATORY_TRACT | 5 refills | Status: DC
Start: 2017-12-03 — End: 2018-08-19

## 2017-12-06 ENCOUNTER — Encounter

## 2017-12-06 MED ORDER — ELIQUIS 5 MG TABLET
5 mg | ORAL_TABLET | ORAL | 11 refills | Status: DC
Start: 2017-12-06 — End: 2018-12-27

## 2017-12-11 DIAGNOSIS — K640 First degree hemorrhoids: Secondary | ICD-10-CM | POA: Diagnosis not present

## 2017-12-11 DIAGNOSIS — K5730 Diverticulosis of large intestine without perforation or abscess without bleeding: Secondary | ICD-10-CM | POA: Diagnosis not present

## 2017-12-11 DIAGNOSIS — Z86010 Personal history of colonic polyps: Secondary | ICD-10-CM | POA: Diagnosis not present

## 2017-12-11 DIAGNOSIS — K635 Polyp of colon: Secondary | ICD-10-CM | POA: Diagnosis not present

## 2017-12-14 DIAGNOSIS — K635 Polyp of colon: Secondary | ICD-10-CM | POA: Diagnosis not present

## 2018-01-15 ENCOUNTER — Ambulatory Visit: Attending: Internal Medicine | Primary: Geriatric Medicine

## 2018-01-15 ENCOUNTER — Ambulatory Visit: Admit: 2018-01-15 | Discharge: 2018-01-15 | Payer: MEDICARE | Attending: Internal Medicine | Primary: Geriatric Medicine

## 2018-01-15 DIAGNOSIS — E1122 Type 2 diabetes mellitus with diabetic chronic kidney disease: Secondary | ICD-10-CM

## 2018-01-15 LAB — AMB POC GLUCOSE BLOOD, BY GLUCOSE MONITORING DEVICE
Glucose POC: 237 mg/dL
Glucose, POC: 237 mg/dL

## 2018-01-15 NOTE — Progress Notes (Signed)
Allison Newman is a 77 y.o. female and presents with Diabetes  .  Subjective:  Pt consulted with the heme, Dr. Dionicia Abler,  who recommended lifelong AC    Pt has an appt scheduled to do a colonoscopy    Pt did not make an appt w sleep medicine, as recommended at her last visit. Pt does NOT want to do a sleep study.    Morbid obesity-  Wt Readings from Last 3 Encounters:   01/15/18 319 lb (144.7 kg)   11/07/17 310 lb 1.6 oz (140.7 kg)   10/12/17 309 lb (140.2 kg)     CKD 2-  Lab Results   Component Value Date/Time    GFR est AA 50 (L) 09/24/2017 07:27 PM    GFR est non-AA 41 (L) 09/24/2017 07:27 PM    Creatinine (POC) 0.8 04/11/2013 11:28 AM    Creatinine 1.27 (H) 09/24/2017 07:27 PM    BUN 27 (H) 09/24/2017 07:27 PM    Sodium 143 09/24/2017 07:27 PM    Potassium 4.0 09/24/2017 07:27 PM    Chloride 111 (H) 09/24/2017 07:27 PM    CO2 24 09/24/2017 07:27 PM     Type 2 DM-controlled  Lab Results   Component Value Date/Time    Hemoglobin A1c 7.7 (H) 06/08/2017 04:05 AM    Hemoglobin A1c (POC) 6.3 10/12/2017 11:26 AM    Hemoglobin A1c, External 6.6 07/29/2014     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 [facility]    COPD/thyroid goiter/hyperlipidemia  Lab Results   Component Value Date/Time    Cholesterol, total 116 06/08/2017 04:00 AM    Cholesterol (POC) 116 10/12/2017 11:29 AM    HDL Cholesterol 50 06/08/2017 04:00 AM    HDL Cholesterol (POC) 54 10/12/2017 11:29 AM    LDL Cholesterol (POC) 46 10/12/2017 11:29 AM    LDL, calculated 46.2 06/08/2017 04:00 AM    VLDL, calculated 19.8 06/08/2017 04:00 AM    Triglyceride 99 06/08/2017 04:00 AM    Triglycerides (POC) 84 10/12/2017 11:29 AM    CHOL/HDL Ratio 2.3 06/08/2017 04:00 AM       Review of Systems  Review of systems (12) negative, except noted above.      Past Medical History:   Diagnosis Date   ??? Abdominal pain 05/08/2013   ??? Abnormal finding on EKG 11/17/2013   ??? ACP (advance care planning) 05/03/2015   ??? Acute pulmonary embolism (HCC) 06/08/2017   ??? Arthritis     ??? 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 Chronic pain of left knee 08/13/2014   ??? Chronic right hip pain 03/17/2013   ??? 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 Diabetes (HCC)    ??? Elevated BUN 09/13/2011   ??? Finger lesion 03/02/2016   ??? Gallbladder polyp 04/08/2013   ??? Gastrointestinal disorder     GERD   ??? GERD (gastroesophageal reflux disease)    ??? Hypercholesterolemia    ??? Hypertension    ??? 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 Microalbuminuria 08/29/2013   ??? 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 On aspirin at home 05/03/2015   ??? Other unknown and unspecified cause of morbidity or mortality     hx of bronchitis   ??? Rash, skin 09/25/2012   ??? Skin ulcer due to diabetes  mellitus (HCC) 03/02/2016   ??? Slow transit constipation 08/13/2014   ??? Umbilical hernia 05/08/2013     Past Surgical History:   Procedure Laterality Date   ??? 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 HX ORTHOPAEDIC  2000    screw in foot left   ??? HX OTHER SURGICAL      colonoscopy   ??? HX TUBAL LIGATION       Social History     Socioeconomic History   ??? Marital status: DIVORCED     Spouse name: Not on file   ??? Number of children: Not on file   ??? Years of education: Not on file   ??? Highest education level: Not on file   Tobacco Use   ??? Smoking status: Former Smoker     Packs/day: 0.25     Years: 1.00     Pack years: 0.25     Types: Cigarettes     Last attempt to quit: 06/03/1991     Years since quitting: 26.6   ??? Smokeless tobacco: Never Used   Substance and Sexual Activity   ??? Alcohol use: No     Alcohol/week: 0.0 standard drinks   ??? Drug use: No   ??? Sexual activity: Never     Family History   Problem Relation Age of Onset   ??? Diabetes Mother    ??? Heart Disease Mother         rheumatic fever   ??? Breast Cancer Mother    ??? Cancer Father         lung    ??? Cancer Sister         colon & breast   ??? Breast Cancer Sister    ??? Cancer Brother         throat     Current Outpatient Medications   Medication Sig Dispense Refill   ??? ELIQUIS 5 mg tablet TAKE 1 TABLET BY MOUTH TWICE DAILY 60 Tab 11   ??? BREO ELLIPTA 200-25 mcg/dose inhaler INHALE 1 PUFF BY MOUTH DAILY 1 Each 5   ??? triamterene-hydroCHLOROthiazide (DYAZIDE) 37.5-25 mg per capsule TAKE 1 CAPSULE EVERY DAY 90 Cap 1   ??? lisinopril (PRINIVIL, ZESTRIL) 10 mg tablet TAKE 1 TABLET EVERY DAY 90 Tab 1   ??? metoprolol tartrate (LOPRESSOR) 50 mg tablet TAKE 1 TABLET TWICE DAILY 180 Tab 1   ??? rosuvastatin (CRESTOR) 20 mg tablet TAKE 1 TABLET EVERY NIGHT 90 Tab 1   ??? metFORMIN (GLUCOPHAGE) 1,000 mg tablet TAKE 1 TABLET TWICE DAILY WITH MEALS 180 Tab 1   ??? methIMAzole (TAPAZOLE) 5 mg tablet TAKE 1 TABLET EVERY DAY 90 Tab 1   ??? glucose blood VI test strips (BLOOD GLUCOSE TEST) strip Use BID Dx11.9 TRUEMETRIX 100 Strip 11   ??? lancets misc Use BID. Dx: E11.9 100 Each 11   ??? nystatin (MYCOSTATIN) powder Apply  to affected area four (4) times daily. 120 g 3   ??? CALCIUM 600 + D tablet take 1 tablet by mouth twice a day 60 Tab 11   ??? aspirin delayed-release 81 mg tablet Take 1 Tab by mouth daily. 30 Tab 11   ??? FREESTYLE LANCETS 28 gauge misc   1   ??? cyanocobalamin 1,000 mcg tablet Take 1,000 mcg by mouth daily.       Allergies   Allergen Reactions   ??? Almond Oil Anaphylaxis, Hives and Itching   ??? Nuts [Tree Nut] Anaphylaxis  Objective:  Visit Vitals  BP 140/79   Pulse 75   Temp 98.6 ??F (37 ??C) (Oral)   Resp 19   Ht 5\' 4"  (1.626 m)   Wt 319 lb (144.7 kg)   LMP  (LMP Unknown)   SpO2 96%   BMI 54.76 kg/m??     Physical Exam:   General appearance - alert, morbidly obese  Mental status - alert, oriented to person, place, and time  EYE-EOMI  Neck - supple, no significant adenopathy   Chest - clear to auscultation, distant BS  Heart - normal rate, regular rhythm, normal S1, S2, 2/6 systolic murmur   Abdomen - soft, nontender, obese w large pannus  Ext-peripheral pulses normal, no pedal edema, no clubbing or cyanosis  Skin-erythematous, well demarcated, moist rash under left breast  Neuro -alert, oriented, normal speech, walks w walker, VERY SLOWLY    Results for orders placed or performed in visit on 01/15/18   AMB POC GLUCOSE BLOOD, BY GLUCOSE MONITORING DEVICE   Result Value Ref Range    Glucose POC 237 mg/dL       Assessment/Plan:    ICD-10-CM ICD-9-CM    1. Type 2 diabetes mellitus with stage 2 chronic kidney disease, without long-term current use of insulin (HCC) E11.22 250.40 AMB POC GLUCOSE BLOOD, BY GLUCOSE MONITORING DEVICE    N18.2 585.2    2. Morbid obesity (HCC) E66.01 278.01    3. COPD (chronic obstructive pulmonary disease) with chronic bronchitis (HCC) J44.9 491.20    4. Current use of long term anticoagulation Z79.01 V58.61    5. Hypercholesterolemia E78.00 272.0    6. Physical deconditioning R53.81 799.3    7. 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 AMB POC GLUCOSE BLOOD, BY GLUCOSE MONITORING DEVICE     1. Type 2 diabetes mellitus with stage 2 chronic kidney disease, without long-term current use of insulin (HCC)  Controled  - AMB POC GLUCOSE BLOOD, BY GLUCOSE MONITORING DEVICE    2. Morbid obesity (HCC)  Worsened    3. COPD (chronic obstructive pulmonary disease) with chronic bronchitis (HCC)  Quiescent on current regimen    4. Current use of long term anticoagulation  Cont eliquis    5. Hypercholesterolemia  Controlled on statin    6. Physical deconditioning  Unchanged    7. Thyroid goiter  Noted            There are no Patient Instructions on file for this visit.   Follow-up and Dispositions    ?? Return in about 3 months (around 04/17/2018) for DM f/u.           I have reviewed with the patient details of the assessment and plan and all questions were answered. Relevent patient education was performed.The most recent lab findings were reviewed with the patient.     An After Visit Summary was printed and given to the patient.

## 2018-01-15 NOTE — Progress Notes (Signed)
Chief Complaint   Patient presents with   ??? Diabetes     1. Have you been to the ER, urgent care clinic since your last visit?  Hospitalized since your last visit?No    2. Have you seen or consulted any other health care providers outside of the [facility] since your last visit?  Include any pap smears or colon screening. No       [facility] amb glucose Dr. Elliott/Tarshell Waller, LPN

## 2018-01-15 NOTE — Progress Notes (Signed)
Allison Newman is a 77 y.o. female and presents with Diabetes  .  Subjective:  Pt consulted with the heme, Dr. Dionicia Ableraman,  who recommended lifelong AC    Pt has an appt scheduled to do a colonoscopy    Pt did not make an appt w sleep medicine, as recommended at her last visit. Pt does NOT want to do a sleep study.    Morbid obesity-  Wt Readings from Last 3 Encounters:   01/15/18 319 lb (144.7 kg)   11/07/17 310 lb 1.6 oz (140.7 kg)   10/12/17 309 lb (140.2 kg)     CKD 2-  Lab Results   Component Value Date/Time    GFR est AA 50 (L) 09/24/2017 07:27 PM    GFR est non-AA 41 (L) 09/24/2017 07:27 PM    Creatinine (POC) 0.8 04/11/2013 11:28 AM    Creatinine 1.27 (H) 09/24/2017 07:27 PM    BUN 27 (H) 09/24/2017 07:27 PM    Sodium 143 09/24/2017 07:27 PM    Potassium 4.0 09/24/2017 07:27 PM    Chloride 111 (H) 09/24/2017 07:27 PM    CO2 24 09/24/2017 07:27 PM     Type 2 DM-controlled  Lab Results   Component Value Date/Time    Hemoglobin A1c 7.7 (H) 06/08/2017 04:05 AM    Hemoglobin A1c (POC) 6.3 10/12/2017 11:26 AM    Hemoglobin A1c, External 6.6 07/29/2014     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 Pain [facility]    COPD/thyroid goiter/hyperlipidemia  Lab Results   Component Value Date/Time    Cholesterol, total 116 06/08/2017 04:00 AM    Cholesterol (POC) 116 10/12/2017 11:29 AM    HDL Cholesterol 50 06/08/2017 04:00 AM    HDL Cholesterol (POC) 54 10/12/2017 11:29 AM    LDL Cholesterol (POC) 46 10/12/2017 11:29 AM    LDL, calculated 46.2 06/08/2017 04:00 AM    VLDL, calculated 19.8 06/08/2017 04:00 AM    Triglyceride 99 06/08/2017 04:00 AM    Triglycerides (POC) 84 10/12/2017 11:29 AM    CHOL/HDL Ratio 2.3 06/08/2017 04:00 AM       Review of Systems  Review of systems (12) negative, except noted above.      Past Medical History:   Diagnosis Date   ??? Abdominal pain 05/08/2013   ??? Abnormal finding on EKG 11/17/2013   ??? ACP (advance care planning) 05/03/2015   ??? Acute pulmonary embolism (HCC) 06/08/2017   ??? Arthritis    ??? Chronic kidney  disease (CKD), 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   ??? 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 Chronic pain of left knee 08/13/2014   ??? Chronic right hip pain 03/17/2013   ??? 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 Diabetes (HCC)    ??? Elevated BUN 09/13/2011   ??? Finger lesion 03/02/2016   ??? Gallbladder polyp 04/08/2013   ??? Gastrointestinal disorder     GERD   ??? GERD (gastroesophageal reflux disease)    ??? Hypercholesterolemia    ??? Hypertension    ??? 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 Microalbuminuria 08/29/2013   ??? 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 On aspirin at home 05/03/2015   ??? Other unknown and unspecified cause of morbidity or mortality     hx of bronchitis   ??? Rash, skin 09/25/2012   ??? Skin ulcer due to diabetes  mellitus (HCC) 03/02/2016   ??? Slow transit constipation 08/13/2014   ??? Umbilical hernia 05/08/2013     Past Surgical History:   Procedure Laterality Date   ??? 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 HX ORTHOPAEDIC  2000    screw in foot left   ??? HX OTHER SURGICAL      colonoscopy   ??? HX TUBAL LIGATION       Social History     Socioeconomic History   ??? Marital status: DIVORCED     Spouse name: Not on file   ??? Number of children: Not on file   ??? Years of education: Not on file   ??? Highest education level: Not on file   Tobacco Use   ??? Smoking status: Former Smoker     Packs/day: 0.25     Years: 1.00     Pack years: 0.25     Types: Cigarettes     Last attempt to quit: 06/03/1991     Years since quitting: 26.6   ??? Smokeless tobacco: Never Used   Substance and Sexual Activity   ??? Alcohol use: No     Alcohol/week: 0.0 standard drinks   ??? Drug use: No   ??? Sexual activity: Never     Family History   Problem Relation Age of Onset   ??? Diabetes Mother    ??? Heart Disease Mother         rheumatic fever   ??? Breast Cancer Mother    ??? Cancer Father         lung   ??? Cancer Sister          colon & breast   ??? Breast Cancer Sister    ??? Cancer Brother         throat     Current Outpatient Medications   Medication Sig Dispense Refill   ??? ELIQUIS 5 mg tablet TAKE 1 TABLET BY MOUTH TWICE DAILY 60 Tab 11   ??? BREO ELLIPTA 200-25 mcg/dose inhaler INHALE 1 PUFF BY MOUTH DAILY 1 Each 5   ??? triamterene-hydroCHLOROthiazide (DYAZIDE) 37.5-25 mg per capsule TAKE 1 CAPSULE EVERY DAY 90 Cap 1   ??? lisinopril (PRINIVIL, ZESTRIL) 10 mg tablet TAKE 1 TABLET EVERY DAY 90 Tab 1   ??? metoprolol tartrate (LOPRESSOR) 50 mg tablet TAKE 1 TABLET TWICE DAILY 180 Tab 1   ??? rosuvastatin (CRESTOR) 20 mg tablet TAKE 1 TABLET EVERY NIGHT 90 Tab 1   ??? metFORMIN (GLUCOPHAGE) 1,000 mg tablet TAKE 1 TABLET TWICE DAILY WITH MEALS 180 Tab 1   ??? methIMAzole (TAPAZOLE) 5 mg tablet TAKE 1 TABLET EVERY DAY 90 Tab 1   ??? glucose blood VI test strips (BLOOD GLUCOSE TEST) strip Use BID Dx11.9 TRUEMETRIX 100 Strip 11   ??? lancets misc Use BID. Dx: E11.9 100 Each 11   ??? nystatin (MYCOSTATIN) powder Apply  to affected area four (4) times daily. 120 g 3   ??? CALCIUM 600 + D tablet take 1 tablet by mouth twice a day 60 Tab 11   ??? aspirin delayed-release 81 mg tablet Take 1 Tab by mouth daily. 30 Tab 11   ??? FREESTYLE LANCETS 28 gauge misc   1   ??? cyanocobalamin 1,000 mcg tablet Take 1,000 mcg by mouth daily.       Allergies   Allergen Reactions   ??? Almond Oil Anaphylaxis, Hives and Itching   ??? Nuts [Tree Nut] Anaphylaxis  Objective:  Visit Vitals  BP 140/79   Pulse 75   Temp 98.6 ??F (37 ??C) (Oral)   Resp 19   Ht 5\' 4"  (1.626 m)   Wt 319 lb (144.7 kg)   LMP  (LMP Unknown)   SpO2 96%   BMI 54.76 kg/m??     Physical Exam:   General appearance - alert, morbidly obese  Mental status - alert, oriented to person, place, and time  EYE-EOMI  Neck - supple, no significant adenopathy   Chest - clear to auscultation, distant BS  Heart - normal rate, regular rhythm, normal S1, S2, 2/6 systolic murmur  Abdomen - soft, nontender, obese w large pannus  Ext-peripheral  pulses normal, no pedal edema, no clubbing or cyanosis  Skin-erythematous, well demarcated, moist rash under left breast  Neuro -alert, oriented, normal speech, walks w walker, VERY SLOWLY    Results for orders placed or performed in visit on 01/15/18   AMB POC GLUCOSE BLOOD, BY GLUCOSE MONITORING DEVICE   Result Value Ref Range    Glucose POC 237 mg/dL       Assessment/Plan:    ICD-10-CM ICD-9-CM    1. Type 2 diabetes mellitus with stage 2 chronic kidney disease, without long-term current use of insulin (HCC) E11.22 250.40 AMB POC GLUCOSE BLOOD, BY GLUCOSE MONITORING DEVICE    N18.2 585.2    2. Morbid obesity (HCC) E66.01 278.01    3. COPD (chronic obstructive pulmonary disease) with chronic bronchitis (HCC) J44.9 491.20    4. Current use of long term anticoagulation Z79.01 V58.61    5. Hypercholesterolemia E78.00 272.0    6. Physical deconditioning R53.81 799.3    7. 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 AMB POC GLUCOSE BLOOD, BY GLUCOSE MONITORING DEVICE     1. Type 2 diabetes mellitus with stage 2 chronic kidney disease, without long-term current use of insulin (HCC)  Controled  - AMB POC GLUCOSE BLOOD, BY GLUCOSE MONITORING DEVICE    2. Morbid obesity (HCC)  Worsened    3. COPD (chronic obstructive pulmonary disease) with chronic bronchitis (HCC)  Quiescent on current regimen    4. Current use of long term anticoagulation  Cont eliquis    5. Hypercholesterolemia  Controlled on statin    6. Physical deconditioning  Unchanged    7. Thyroid goiter  Noted            There are no Patient Instructions on file for this visit.   Follow-up and Dispositions    ?? Return in about 3 months (around 04/17/2018) for DM f/u.           I have reviewed with the patient details of the assessment and plan and all questions were answered. Relevent patient education was performed.The most recent lab findings were reviewed with the patient.    An After Visit Summary was printed and given to the  patient.

## 2018-01-15 NOTE — Progress Notes (Signed)
Chief Complaint   Patient presents with   . Diabetes     1. Have you been to the ER, urgent care clinic since your last visit?  Hospitalized since your last visit?No    2. Have you seen or consulted any other health care providers outside of the [facility] since your last visit?  Include any pap smears or colon screening. No        [facility] amb glucose Dr. Loreli Dollar, LPN

## 2018-01-30 ENCOUNTER — Encounter: Attending: Specialist | Primary: Geriatric Medicine

## 2018-02-04 ENCOUNTER — Encounter

## 2018-02-04 NOTE — Telephone Encounter (Signed)
Patient called and stated that she would like for Dr. Mechele CollinElliott to send a two week supply to her local [facility] pharmacy on file The reason is because some of her medication fell out of her purse and [facility] not let her refill it until December 14th. She just need enough to last her until then.

## 2018-02-05 MED ORDER — METHIMAZOLE 5 MG TAB
5 mg | ORAL_TABLET | ORAL | 1 refills | Status: DC
Start: 2018-02-05 — End: 2018-02-16

## 2018-02-18 ENCOUNTER — Encounter

## 2018-02-18 MED ORDER — METHIMAZOLE 5 MG TAB
5 mg | ORAL_TABLET | ORAL | 1 refills | Status: DC
Start: 2018-02-18 — End: 2018-06-21

## 2018-02-18 NOTE — Telephone Encounter (Signed)
Dr. Elliott/telephone   Received: 3 days ago   Message Contents   Johnson-Mitchell, Shavette A sent to [facility]      ??      Caller (first and last name if not the patient or from practice): n/a   Caller's relationship to patient (if not from a practice): n/a   Name of caller (if calling from a practice): n/a   Patient insurance Type: HMO   Name of practice: ??[facility]   Specialist's title, first, and last name: Dermatologist, Amy Cavanagh   Specialist Type: Dermatology   Office Phone Number: [phone]   Fax number: [phone]   Date and time of appointment: Jan7th at 9:10 AM   Reason for appointment: Pt would a referral to see this dermatologist.   Details to clarify the request:

## 2018-02-18 NOTE — Telephone Encounter (Signed)
Referral done

## 2018-02-19 ENCOUNTER — Ambulatory Visit: Payer: MEDICARE | Primary: Geriatric Medicine

## 2018-03-07 ENCOUNTER — Encounter

## 2018-03-08 MED ORDER — NYSTATIN 100,000 UNIT/G TOPICAL POWDER
100000 unit/gram | CUTANEOUS | 3 refills | Status: AC
Start: 2018-03-08 — End: ?

## 2018-03-25 ENCOUNTER — Encounter

## 2018-03-26 ENCOUNTER — Ambulatory Visit: Payer: MEDICARE | Primary: Geriatric Medicine

## 2018-03-26 ENCOUNTER — Other Ambulatory Visit: Payer: Self-pay | Admitting: Internal Medicine

## 2018-03-26 DIAGNOSIS — Z1231 Encounter for screening mammogram for malignant neoplasm of breast: Secondary | ICD-10-CM

## 2018-03-26 MED ORDER — ROSUVASTATIN 20 MG TAB
20 mg | ORAL_TABLET | ORAL | 1 refills | Status: DC
Start: 2018-03-26 — End: 2018-09-13

## 2018-03-26 MED ORDER — METFORMIN 1,000 MG TAB
1000 mg | ORAL_TABLET | ORAL | 1 refills | Status: DC
Start: 2018-03-26 — End: 2018-09-13

## 2018-03-29 DIAGNOSIS — E7800 Pure hypercholesterolemia, unspecified: Secondary | ICD-10-CM | POA: Diagnosis not present

## 2018-03-29 DIAGNOSIS — M4800 Spinal stenosis, site unspecified: Secondary | ICD-10-CM | POA: Diagnosis not present

## 2018-03-29 DIAGNOSIS — R05 Cough: Secondary | ICD-10-CM | POA: Diagnosis not present

## 2018-03-29 DIAGNOSIS — K219 Gastro-esophageal reflux disease without esophagitis: Secondary | ICD-10-CM | POA: Diagnosis not present

## 2018-03-29 DIAGNOSIS — I10 Essential (primary) hypertension: Secondary | ICD-10-CM | POA: Diagnosis not present

## 2018-03-29 DIAGNOSIS — E663 Overweight: Secondary | ICD-10-CM | POA: Diagnosis not present

## 2018-04-09 ENCOUNTER — Ambulatory Visit
Admission: RE | Admit: 2018-04-09 | Discharge: 2018-04-09 | Disposition: A | Discharge status: Home / Self Care | Payer: Medicare HMO | Source: Ambulatory Visit | Attending: Internal Medicine | Admitting: Internal Medicine

## 2018-04-09 DIAGNOSIS — Z1231 Encounter for screening mammogram for malignant neoplasm of breast: Secondary | ICD-10-CM

## 2018-04-17 ENCOUNTER — Encounter: Attending: Internal Medicine | Primary: Geriatric Medicine

## 2018-04-23 ENCOUNTER — Inpatient Hospital Stay: Payer: MEDICARE

## 2018-04-23 MED ORDER — FLUMAZENIL 0.1 MG/ML IV SOLN
0.1 mg/mL | INTRAVENOUS | Status: DC | PRN
Start: 2018-04-23 — End: 2018-04-23

## 2018-04-23 MED ORDER — PANTOPRAZOLE 40 MG TAB, DELAYED RELEASE
40 mg | ORAL_TABLET | Freq: Every day | ORAL | 1 refills | Status: AC
Start: 2018-04-23 — End: ?

## 2018-04-23 MED ORDER — MIDAZOLAM 1 MG/ML IJ SOLN
1 mg/mL | INTRAMUSCULAR | Status: DC | PRN
Start: 2018-04-23 — End: 2018-04-23

## 2018-04-23 MED ORDER — ATROPINE 0.1 MG/ML SYRINGE
0.1 mg/mL | Freq: Once | INTRAMUSCULAR | Status: DC | PRN
Start: 2018-04-23 — End: 2018-04-23

## 2018-04-23 MED ORDER — NALOXONE 0.4 MG/ML INJECTION
0.4 mg/mL | INTRAMUSCULAR | Status: DC | PRN
Start: 2018-04-23 — End: 2018-04-23

## 2018-04-23 MED ORDER — EPINEPHRINE 0.1 MG/ML SYRINGE
0.1 mg/mL | Freq: Once | INTRAMUSCULAR | Status: DC | PRN
Start: 2018-04-23 — End: 2018-04-23

## 2018-04-23 MED ORDER — PROPOFOL 10 MG/ML IV EMUL
10 mg/mL | INTRAVENOUS | Status: DC | PRN
Start: 2018-04-23 — End: 2018-04-23
  Administered 2018-04-23 (×12): via INTRAVENOUS

## 2018-04-23 MED ORDER — FENTANYL CITRATE (PF) 50 MCG/ML IJ SOLN
50 mcg/mL | INTRAMUSCULAR | Status: DC | PRN
Start: 2018-04-23 — End: 2018-04-23

## 2018-04-23 MED ORDER — GLYCOPYRROLATE 0.2 MG/ML IJ SOLN
0.2 mg/mL | INTRAMUSCULAR | Status: DC | PRN
Start: 2018-04-23 — End: 2018-04-23
  Administered 2018-04-23: 18:00:00 via INTRAVENOUS

## 2018-04-23 MED ORDER — SIMETHICONE 40 MG/0.6 ML ORAL DROPS, SUSP
40 mg/0.6 mL | ORAL | Status: DC | PRN
Start: 2018-04-23 — End: 2018-04-23
  Administered 2018-04-23: 18:00:00 via ORAL

## 2018-04-23 MED ORDER — LIDOCAINE (PF) 20 MG/ML (2 %) IJ SOLN
20 mg/mL (2 %) | INTRAMUSCULAR | Status: DC | PRN
Start: 2018-04-23 — End: 2018-04-23
  Administered 2018-04-23 (×2): via INTRAVENOUS

## 2018-04-23 MED ORDER — SODIUM CHLORIDE 0.9 % IJ SYRG
INTRAMUSCULAR | Status: DC | PRN
Start: 2018-04-23 — End: 2018-04-23

## 2018-04-23 MED ORDER — SODIUM CHLORIDE 0.9 % IV
INTRAVENOUS | Status: DC | PRN
Start: 2018-04-23 — End: 2018-04-23
  Administered 2018-04-23: 18:00:00 via INTRAVENOUS

## 2018-04-23 MED ORDER — SODIUM CHLORIDE 0.9 % IJ SYRG
Freq: Three times a day (TID) | INTRAMUSCULAR | Status: DC
Start: 2018-04-23 — End: 2018-04-23

## 2018-04-23 MED ORDER — SODIUM CHLORIDE 0.9 % IV
INTRAVENOUS | Status: DC
Start: 2018-04-23 — End: 2018-04-23

## 2018-04-23 MED FILL — SIMETHICONE 40 MG/0.6 ML ORAL DROPS, SUSP: 40 mg/0.6 mL | ORAL | Qty: 30

## 2018-04-23 MED FILL — SODIUM CHLORIDE 0.9 % IV: INTRAVENOUS | Qty: 1000

## 2018-04-23 NOTE — Anesthesia Pre-Procedure Evaluation (Signed)
Relevant Problems   No relevant active problems       Anesthetic History   No history of anesthetic complications            Review of Systems / Medical History  Patient summary reviewed, nursing notes reviewed and pertinent labs reviewed    Pulmonary    COPD               Neuro/Psych   Within defined limits           Cardiovascular    Hypertension                   GI/Hepatic/Renal     GERD           Endo/Other    Diabetes  Hypothyroidism  Morbid obesity     Other Findings              Physical Exam    Airway  Mallampati: II  TM Distance: > 6 cm  Neck ROM: normal range of motion   Mouth opening: Normal     Cardiovascular  Regular rate and rhythm,  S1 and S2 normal,  no murmur, click, rub, or gallop             Dental  No notable dental hx       Pulmonary  Breath sounds clear to auscultation               Abdominal  GI exam deferred       Other Findings            Anesthetic Plan    ASA: 3  Anesthesia type: MAC          Induction: Intravenous  Anesthetic plan and risks discussed with: Patient

## 2018-04-23 NOTE — H&P (Signed)
[place], [place]      History and Physical       NAME:  Allison Newman   DOB:   [birth year]   MRN:   [id number]             History of Present Illness:  Patient is a 78 y.o. who is seen for history of colon polyps and family history of colon cancer. Last colonoscopy was in 2009 and polyps were removed. She has a history of dysphagia.     PMH:  Past Medical History:   Diagnosis Date   ??? Abdominal pain 05/08/2013   ??? Abnormal finding on EKG 11/17/2013   ??? ACP (advance care planning) 05/03/2015   ??? Acute pulmonary embolism ([place]) 06/08/2017   ??? Arthritis    ??? 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 Chronic pain of left knee 08/13/2014   ??? Chronic right hip pain 03/17/2013   ??? COPD (chronic obstructive pulmonary disease) with chronic bronchitis ([place]) 04/19/2016   ??? Diabetes ([place])    ??? Elevated BUN 09/13/2011   ??? Finger lesion 03/02/2016   ??? Gallbladder polyp 04/08/2013   ??? Gastrointestinal disorder     GERD   ??? GERD (gastroesophageal reflux disease)    ??? Hypercholesterolemia    ??? Hypertension    ??? 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 Microalbuminuria 08/29/2013   ??? 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, adult ([place]) 08/28/2011   ??? 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 On aspirin at home 05/03/2015   ??? Other unknown and unspecified cause of morbidity or mortality     hx of bronchitis   ??? Rash, skin 09/25/2012   ??? 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 Slow transit constipation 11/08/1738   ??? Umbilical hernia 10/05/4479       PSH:  Past Surgical History:   Procedure Laterality Date   ??? 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 HX ORTHOPAEDIC  2000    screw in foot left   ??? HX OTHER SURGICAL      colonoscopy   ??? HX TUBAL LIGATION         Allergies:  Allergies   Allergen Reactions   ??? Almond Oil Anaphylaxis, Hives and Itching   ??? Nuts [Tree Nut] Anaphylaxis        Home Medications:  Prior to Admission Medications   Prescriptions Last Dose Informant Patient Reported? Taking?   BREO ELLIPTA 200-25 mcg/dose inhaler 04/22/2018 at Unknown time  No Yes   Sig: INHALE 1 PUFF BY MOUTH DAILY   CALCIUM 600 + D tablet 04/21/2018  No No   Sig: take 1 tablet by mouth twice a day   ELIQUIS 5 mg tablet 04/20/2018  No No   Sig: TAKE 1 TABLET BY MOUTH TWICE DAILY   FREESTYLE LANCETS 28 gauge misc   Yes No   aspirin delayed-release 81 mg tablet 04/22/2018 at Unknown time  No Yes   Sig: Take 1 Tab by mouth daily.   glucose blood VI test strips (BLOOD GLUCOSE TEST) strip   No No   Sig: Use BID Dx11.9 TRUEMETRIX   lancets misc   No No   Sig: Use BID. Dx: E11.9   lisinopril (PRINIVIL, ZESTRIL) 10 mg tablet 04/23/2018 at Unknown time  No Yes   Sig: TAKE 1 TABLET EVERY DAY   metFORMIN (GLUCOPHAGE) 1,000 mg tablet 04/22/2018 at Unknown time  No Yes   Sig: TAKE 1 TABLET TWICE DAILY WITH MEALS   methIMAzole (TAPAZOLE) 5 mg tablet 04/22/2018 at Unknown time  No Yes   Sig: TAKE 1 TABLET EVERY DAY   metoprolol tartrate (LOPRESSOR) 50 mg tablet 04/22/2018 at Unknown time  No Yes   Sig: TAKE 1 TABLET TWICE DAILY   nystatin (NYSTOP) powder 04/22/2018 at Unknown time  No Yes   Sig: Apply to affected area TID PRN   rosuvastatin (CRESTOR) 20 mg tablet 04/22/2018 at Unknown time  No Yes   Sig: TAKE 1 TABLET EVERY NIGHT   triamterene-hydroCHLOROthiazide (DYAZIDE) 37.5-25 mg per capsule 04/23/2018 at Unknown time  No Yes   Sig: TAKE 1 CAPSULE EVERY DAY      Facility-Administered Medications: None       Hospital Medications:  Current Facility-Administered Medications   Medication Dose Route Frequency   ??? 0.9% sodium chloride infusion  50 mL/hr IntraVENous CONTINUOUS   ??? sodium chloride (NS) flush 5-40 mL  5-40 mL IntraVENous Q8H   ??? sodium chloride (NS) flush 5-40 mL  5-40 mL IntraVENous PRN   ??? midazolam (VERSED) injection 0.25-5 mg  0.25-5 mg IntraVENous Multiple    ??? fentaNYL citrate (PF) injection 25-200 mcg  25-200 mcg IntraVENous Multiple   ??? naloxone (NARCAN) injection 0.4 mg  0.4 mg IntraVENous Multiple   ??? flumazeniL (ROMAZICON) 0.1 mg/mL injection 0.2 mg  0.2 mg IntraVENous Multiple   ??? simethicone (MYLICON) 13YQ/6.5HQ oral drops 80 mg  1.2 mL Oral Multiple   ??? atropine injection 0.5 mg  0.5 mg IntraVENous ONCE PRN   ??? EPINEPHrine (ADRENALIN) 0.1 mg/mL syringe 1 mg  1 mg Endoscopically ONCE PRN       Social History:  Social History     Tobacco Use   ??? Smoking status: Former Smoker     Packs/day: 0.25     Years: 1.00     Pack years: 0.25     Types: Cigarettes     Last attempt to quit: 06/03/1991     Years since quitting: 26.9   ??? Smokeless tobacco: Never Used   Substance Use Topics   ??? Alcohol use: No     Alcohol/week: 0.0 standard drinks       Family History:  Family History   Problem Relation Age of Onset   ??? Diabetes Mother    ??? Heart Disease Mother         rheumatic fever   ??? Breast Cancer Mother    ??? Cancer Father         lung   ??? Cancer Sister         colon & breast   ??? Breast Cancer Sister    ??? Cancer Brother         throat             Review of Systems:      Constitutional: negative fever, negative chills, negative weight loss  Eyes:   negative visual changes  ENT:   negative sore throat, tongue or lip swelling  Respiratory:  negative cough, negative dyspnea  Cards:  negative for chest pain, palpitations, lower extremity edema  GI:   See HPI  GU:  negative for frequency, dysuria  Integument:  negative for rash and pruritus  Heme:  negative for easy bruising and gum/nose bleeding  Musculoskel: negative for  myalgias,  back pain and muscle weakness  Neuro: negative for headaches, dizziness, vertigo  Psych:  negative for feelings of anxiety, depression       Objective:     Patient Vitals for the past 8 hrs:   BP Temp Pulse Resp SpO2 Height Weight   04/23/18 1238 125/70 98.2 ??F (36.8 ??C) 85 20 95 % 5' 4"  (1.626 m) 144.7 kg (319 lb)     No intake/output data recorded.   No intake/output data recorded.    EXAM:     NEURO-a&o   HEENT-wnl   LUNGS-clear    COR-regular rate and rhythym     ABD-soft , no tenderness, no rebound, good bs     EXT-no edema     Data Review     No results for input(s): WBC, HGB, HCT, PLT, HGBEXT, HCTEXT, PLTEXT in the last 72 hours.  No results for input(s): NA, K, CL, CO2, BUN, CREA, GLU, PHOS, CA in the last 72 hours.  No results for input(s): SGOT, GPT, AP, TBIL, TP, ALB, GLOB, GGT, AML, LPSE in the last 72 hours.    No lab exists for component: AMYP, HLPSE  No results for input(s): INR, PTP, APTT, INREXT in the last 72 hours.       Assessment:   ?? Dysphagia  ?? History of colon polyps  ?? Family history of colon cancer     Patient Active Problem List   Diagnosis Code   ??? Essential hypertension I10   ??? Neuropathic arthritis M14.60   ??? Low TSH level R79.89   ??? Acid reflux K21.9   ??? Hypercholesterolemia E78.00   ??? Rash in adult R21   ??? Lung nodule seen on imaging study R91.1   ??? 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 Chronic right hip pain M25.551, G89.29   ??? Gallbladder polyp K82.4   ??? Umbilical hernia B20.1   ??? Microalbuminuria R80.9   ??? Abnormal finding on EKG R94.31   ??? Slow transit constipation K59.01   ??? Chronic pain of left knee M25.562, G89.29   ??? Osteopenia M85.80   ??? ACP (advance care planning) Z71.89   ??? 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 Morbid obesity (HCC) E66.01   ??? Pulmonary embolus (HCC) I26.99   ??? DM2 (diabetes mellitus, type 2) (HCC) E11.9   ??? Hyperthyroidism E05.90   ??? Abnormal nuclear stress test R94.39   ??? Physical deconditioning R53.81   ??? Current use of long term anticoagulation Z79.01     Plan:   ?? Endoscopic procedure with MAC     Signed By: Birdie Hopes. Elisha Ponder, MD     04/23/2018  12:43 PM

## 2018-04-23 NOTE — Anesthesia Pre-Procedure Evaluation (Signed)
Relevant Problems   No relevant active problems       Anesthetic History   No history of anesthetic complications            Review of Systems / Medical History  Patient summary reviewed, nursing notes reviewed and pertinent labs reviewed    Pulmonary    COPD               Neuro/Psych   Within defined limits           Cardiovascular    Hypertension                   GI/Hepatic/Renal     GERD           Endo/Other    Diabetes  Hypothyroidism  Morbid obesity     Other Findings              Physical Exam    Airway  Mallampati: II  TM Distance: > 6 cm  Neck ROM: normal range of motion   Mouth opening: Normal     Cardiovascular  Regular rate and rhythm,  S1 and S2 normal,  no murmur, click, rub, or gallop             Dental  No notable dental hx       Pulmonary  Breath sounds clear to auscultation               Abdominal  GI exam deferred       Other Findings            Anesthetic Plan    ASA: 3  Anesthesia type: MAC          Induction: Intravenous  Anesthetic plan and risks discussed with: Patient

## 2018-04-23 NOTE — H&P (Signed)
H&P by Marlise Eves., MD at 04/23/18 1243                Author: Marlise Eves., MD  Service: --  Author Type: Physician       Filed: 04/23/18 8469  Date of Service: 04/23/18 1243  Status: Signed          Editor: Marlise Eves., MD (Physician)               [place]   [place], [place]             History and Physical           NAME:  Allison Newman     DOB:   June 02, 1940    MRN:   [id number]                   History of Present Illness:  Patient is a  78 y.o. who is seen for history of colon polyps and family history of colon cancer. Last colonoscopy was in 2009 and polyps were removed. She has a history of dysphagia.       PMH:     Past Medical History:        Diagnosis  Date         ?  Abdominal pain  05/08/2013     ?  Abnormal finding on EKG  11/17/2013     ?  ACP (advance care planning)  05/03/2015     ?  Acute pulmonary embolism ([place])  06/08/2017     ?  Arthritis       ?  Chronic kidney disease (CKD), stage II (mild)  03/02/2016     ?  Chronic pain disorder  06/04/2013     ?  Chronic pain of left knee  08/13/2014     ?  Chronic right hip pain  03/17/2013     ?  COPD (chronic obstructive pulmonary disease) with chronic bronchitis ([place])  04/19/2016     ?  Diabetes ([place])       ?  Elevated BUN  09/13/2011     ?  Finger lesion  03/02/2016     ?  Gallbladder polyp  04/08/2013     ?  Gastrointestinal disorder            GERD         ?  GERD (gastroesophageal reflux disease)       ?  Hypercholesterolemia       ?  Hypertension       ?  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  Microalbuminuria  08/29/2013     ?  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, adult ([place])  08/28/2011     ?  Neuropathic arthritis  08/28/2011     ?  Neuropathic arthritis  08/28/2011     ?  On aspirin at home  05/03/2015     ?  Other unknown and unspecified cause of morbidity or mortality            hx of bronchitis         ?  Rash, skin  09/25/2012     ?  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  Slow transit constipation  08/13/2014         ?  Umbilical hernia  04/09/4008  PSH:     Past Surgical History:         Procedure  Laterality  Date          ?  HX ORTHOPAEDIC    1998          knee replacement right          ?  HX ORTHOPAEDIC    2000          screw in foot left          ?  HX OTHER SURGICAL              colonoscopy          ?  HX TUBAL LIGATION               Allergies:     Allergies        Allergen  Reactions         ?  Almond Oil  Anaphylaxis, Hives and Itching         ?  Nuts [Tree Nut]  Anaphylaxis           Home Medications:     Prior to Admission Medications     Prescriptions  Last Dose  Informant  Patient Reported?  Taking?      BREO ELLIPTA 200-25 mcg/dose inhaler  04/22/2018 at Unknown time    No  Yes      Sig: INHALE 1 PUFF BY MOUTH DAILY      CALCIUM 600 + D tablet  04/21/2018    No  No      Sig: take 1 tablet by mouth twice a day      ELIQUIS 5 mg tablet  04/20/2018    No  No      Sig: TAKE 1 TABLET BY MOUTH TWICE DAILY      FREESTYLE LANCETS 28 gauge misc      Yes  No      aspirin delayed-release 81 mg tablet  04/22/2018 at Unknown time    No  Yes      Sig: Take 1 Tab by mouth daily.      glucose blood VI test strips (BLOOD GLUCOSE TEST) strip      No  No      Sig: Use BID Dx11.9 TRUEMETRIX      lancets misc      No  No      Sig: Use BID. Dx: E11.9      lisinopril (PRINIVIL, ZESTRIL) 10 mg tablet  04/23/2018 at Unknown time    No  Yes      Sig: TAKE 1 TABLET EVERY DAY      metFORMIN (GLUCOPHAGE) 1,000 mg tablet  04/22/2018 at Unknown time    No  Yes      Sig: TAKE 1 TABLET TWICE DAILY WITH MEALS      methIMAzole (TAPAZOLE) 5 mg tablet  04/22/2018 at Unknown time    No  Yes      Sig: TAKE 1 TABLET EVERY DAY      metoprolol tartrate (LOPRESSOR) 50 mg tablet  04/22/2018 at Unknown time    No  Yes      Sig: TAKE 1 TABLET TWICE DAILY      nystatin (NYSTOP) powder  04/22/2018 at Unknown time    No  Yes      Sig: Apply to affected area TID PRN      rosuvastatin (CRESTOR) 20 mg  tablet  04/22/2018 at Unknown  time    No  Yes      Sig: TAKE 1 TABLET EVERY NIGHT      triamterene-hydroCHLOROthiazide (DYAZIDE) 37.5-25 mg per capsule  04/23/2018 at Unknown time    No  Yes      Sig: TAKE 1 CAPSULE EVERY DAY               Facility-Administered Medications: None           Hospital Medications:     Current Facility-Administered Medications          Medication  Dose  Route  Frequency           ?  0.9% sodium chloride infusion   50 mL/hr  IntraVENous  CONTINUOUS     ?  sodium chloride (NS) flush 5-40 mL   5-40 mL  IntraVENous  Q8H     ?  sodium chloride (NS) flush 5-40 mL   5-40 mL  IntraVENous  PRN     ?  midazolam (VERSED) injection 0.25-5 mg   0.25-5 mg  IntraVENous  Multiple     ?  fentaNYL citrate (PF) injection 25-200 mcg   25-200 mcg  IntraVENous  Multiple     ?  naloxone (NARCAN) injection 0.4 mg   0.4 mg  IntraVENous  Multiple     ?  flumazeniL (ROMAZICON) 0.1 mg/mL injection 0.2 mg   0.2 mg  IntraVENous  Multiple     ?  simethicone (MYLICON) 64PP/2.9JJ oral drops 80 mg   1.2 mL  Oral  Multiple     ?  atropine injection 0.5 mg   0.5 mg  IntraVENous  ONCE PRN           ?  EPINEPHrine (ADRENALIN) 0.1 mg/mL syringe 1 mg   1 mg  Endoscopically  ONCE PRN           Social History:     Social History          Tobacco Use         ?  Smoking status:  Former Smoker              Packs/day:  0.25         Years:  1.00         Pack years:  0.25         Types:  Cigarettes         Last attempt to quit:  06/03/1991         Years since quitting:  26.9         ?  Smokeless tobacco:  Never Used       Substance Use Topics         ?  Alcohol use:  No              Alcohol/week:  0.0 standard drinks           Family History:     Family History         Problem  Relation  Age of Onset          ?  Diabetes  Mother       ?  Heart Disease  Mother                rheumatic fever          ?  Breast Cancer  Mother       ?  Cancer  Father  lung          ?  Cancer  Sister                colon & breast          ?   Breast Cancer  Sister       ?  Cancer  Brother                throat                    Review of Systems:         Constitutional: negative fever, negative chills, negative weight loss   Eyes:   negative visual changes   ENT:   negative sore throat, tongue or lip swelling   Respiratory:  negative cough, negative dyspnea   Cards:  negative for chest pain, palpitations, lower extremity edema   GI:   See HPI   GU:  negative for frequency, dysuria   Integument:  negative for rash and pruritus   Heme:  negative for easy bruising and gum/nose bleeding   Musculoskel: negative for myalgias,  back pain and muscle weakness   Neuro: negative for headaches, dizziness, vertigo   Psych:  negative for feelings of anxiety, depression            Objective:        Patient Vitals for the past 8 hrs:              BP  Temp  Pulse  Resp  SpO2  Height  Weight              04/23/18 1238  125/70  98.2 ??F (36.8 ??C)  85  20  95 %  _0  (1.626 m)  144.7 kg (319 lb)        No intake/output data recorded.   No intake/output data recorded.      EXAM:      NEURO-a&o    HEENT-wnl    LUNGS-clear     COR-regular rate and rhythym      ABD-soft , no tenderness, no rebound, good bs      EXT-no edema       Data Review       No results for input(s): WBC, HGB, HCT, PLT, HGBEXT, HCTEXT, PLTEXT in the last 72 hours.   No results for input(s): NA, K, CL, CO2, BUN, CREA, GLU, PHOS, CA in the last 72 hours.   No results for input(s): SGOT, GPT, AP, TBIL, TP, ALB, GLOB, GGT, AML, LPSE in the last 72 hours.      No lab exists for component: AMYP, HLPSE   No results for input(s): INR, PTP, APTT, INREXT in the last 72 hours.            Assessment:     ??    Dysphagia   ??  History of colon polyps   ??  Family history of colon cancer          Patient Active Problem List        Diagnosis  Code         ?  Essential hypertension  I10     ?  Neuropathic arthritis  M14.60     ?  Low TSH level  R79.89     ?  Acid reflux  K21.9     ?  Hypercholesterolemia  E78.00     ?  Rash  in adult  R21     ?  Lung nodule seen on imaging study  R91.1     ?  Thyroid goiter  E04.9     ?  Chronic right hip pain  M25.551, G89.29     ?  Gallbladder polyp  K82.4     ?  Umbilical hernia  T24.5     ?  Microalbuminuria  R80.9     ?  Abnormal finding on EKG  R94.31     ?  Slow transit constipation  K59.01     ?  Chronic pain of left knee  M25.562, G89.29     ?  Osteopenia  M85.80     ?  ACP (advance care planning)  Z71.89     ?  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  COPD (chronic obstructive pulmonary disease) with chronic bronchitis ([place])  J44.9     ?  Pernicious anemia  D51.0     ?  Morbid obesity ([place])  E66.01     ?  Pulmonary embolus (HCC)  I26.99     ?  DM2 (diabetes mellitus, type 2) ([place])  E11.9     ?  Hyperthyroidism  E05.90     ?  Abnormal nuclear stress test  R94.39     ?  Physical deconditioning  R53.81         ?  Current use of long term anticoagulation  Z79.01          Plan:     ??    Endoscopic procedure with MAC           Signed By:  Birdie Hopes. Elisha Ponder, MD        04/23/2018  12:43 PM

## 2018-04-23 NOTE — Procedures (Signed)
Procedures  by Marlise Eves., MD at 04/23/18 1334                Author: Marlise Eves., MD  Service: --  Author Type: Physician       Filed: 04/23/18 1337  Date of Service: 04/23/18 1334  Status: Signed          Editor: Marlise Eves., MD (Physician)            Pre-procedure Diagnoses        1. Family history of colon cancer [Z80.0]        2. History of colon polyps [Z86.010]                           Post-procedure Diagnoses        1. Polyp of colon, unspecified part of colon, unspecified type [K63.5]        2. Internal hemorrhoids [K64.8]                           Procedures        1. COLONOSCOPY,REMV LESN,SNARE [[id number]]        2. [place]   [place], [place]            Colonoscopy Operative Report      ISA HITZ   [id number]   September 26, 1940         Procedure Type:   Colonoscopy with polypectomy (snare cautery), polypectomy (cold biopsy)       Indications:    Personal history of colon polyps (screening only)             Pre-operative Diagnosis: see indication above      Post-operative Diagnosis:  See findings below      Operator:  Arch Methot P. Elisha Ponder, MD         Referring Provider: Karlyn Agee, MD         Sedation:  MAC anesthesia Propofol         Procedure Details:  After informed consent was obtained with all risks and benefits of procedure explained and preoperative exam completed, the patient was taken to the endoscopy suite and placed in the left lateral decubitus position.  Upon sequential  sedation as per above, a digital rectal exam was performed demonstrating internal hemorrhoids.  The Olympus pediatric videocolonoscope  was inserted in the rectum and carefully advanced to the terminal ileum.  The cecum was identified by the ileocecal  valve and appendiceal orifice.  The quality of preparation was good.  The colonoscope was slowly withdrawn with careful evaluation  between folds. Retroflexion in the rectum was completed .       Findings:    Rectum: normal   Sigmoid: normal   Descending Colon: normal   Transverse Colon: normal   Ascending Colon: Four polyps were removed. Two were sessile and 10 mm in size - removed with hot snare. Polypectomy site closed with single Resolution 360 clip at each site. Two were 2 mm in size - removed with cold biopsy forceps   Cecum: normal   Terminal Ileum: normal         Specimen Removed: 1. Ascending  colon polyps         Complications: None.       EBL:  None.      Impression:    1. Ascending colon polyps - removed   2. Small internal hemorrhoids      Recommendations:   1. Follow up surgical pathology   2. Repeat colonoscopy in 3-5 years based on pathology results   3. High fiber diet education   4. Restart Eliquis on Friday 04/26/18         Signed By:  Birdie Hopes. Elisha Ponder, MD           04/23/2018  1:34 PM

## 2018-04-23 NOTE — Anesthesia Post-Procedure Evaluation (Signed)
Procedure(s):  COLONOSCOPY AND ESOPHAGOGASTRODUODENOSCOPY (EGD)  ESOPHAGOGASTRODUODENOSCOPY (EGD)  ESOPHAGOGASTRODUODENAL (EGD) BIOPSY  ENDOSCOPIC POLYPECTOMY  RESOLUTION CLIP.    MAC    Anesthesia Post Evaluation        Patient location during evaluation: PACU  Patient participation: complete - patient participated  Level of consciousness: awake  Pain management: adequate  Airway patency: patent  Anesthetic complications: no  Cardiovascular status: hemodynamically stable  Respiratory status: acceptable  Hydration status: acceptable  Comments: I have seen and evaluated the patient. The patient is ready for PACU discharge.  [place], DO                         Vitals Value Taken Time   BP 105/63 04/23/2018  1:24 PM   Temp     Pulse 110 04/23/2018  1:24 PM   Resp 18 04/23/2018  1:24 PM   SpO2 100 % 04/23/2018  1:24 PM

## 2018-04-23 NOTE — Procedures (Signed)
Procedures  by Marlise Eves., MD at 04/23/18 1332                Author: Marlise Eves., MD  Service: --  Author Type: Physician       Filed: 04/23/18 1334  Date of Service: 04/23/18 1332  Status: Signed          Editor: Marlise Eves., MD (Physician)            Pre-procedure Diagnoses        1. Pharyngoesophageal dysphagia [R13.14]                           Post-procedure Diagnoses        1. Gastric ulcer without hemorrhage or perforation, unspecified chronicity [K25.9]                           Procedures        1. UPPER GI ENDOSCOPY,BIOPSY [[id number]]                              [place]   [place], [place]              [facility]- Gastroduodenoscopy (EGD) Procedure Note      Allison Newman   [birth year]   [id number]         Procedure: Endoscopic Gastroduodenoscopy --diagnostic, with biopsy      Indication: dysphagia      Pre-operative Diagnosis: see indication above      Post-operative Diagnosis: see findings below      Operator: Eathen Budreau P. Elisha Ponder, MD      Referring Provider:  Karlyn Agee, MD         Anesthesia/Sedation:  MAC anesthesia Propofol           Procedure Details       After informed consent was obtained for the procedure, with all risks and benefits of procedure explained the patient was taken to the endoscopy suite and placed in the left lateral decubitus position.  Following sequential administration of sedation  as per above, the endoscope was inserted into the mouth and advanced under direct vision to second portion of the duodenum.  A careful inspection was made as the gastroscope was withdrawn, including a retroflexed view of the proximal stomach; findings  and interventions are described below.        Findings:    Esophagus:normal   Stomach: single 5-6 mm linear ulcer in gastric body - cold biopsies taken   Duodenum: normal to second portion         Therapies:  As above      Specimens: 1. gastric           EBL: None         Complications:   None; patient tolerated the procedure well.             Impression:     1. Gastric ulcer- biopsied      Recommendations:   1. Follow up surgical pathology   2. Treat for H pylori if positive   3. PPI script provided   4. Avoid non-essential NSAID's   5. Proceed to colonoscopy         Signed By:  Birdie Hopes. Elisha Ponder, MD  04/23/2018  1:32 PM

## 2018-04-23 NOTE — Other (Signed)
Allison Newman  04/18/1940  [id number]    Situation:  Verbal report received from: G. Elita Boone, RN  Procedure: Procedure(s) with comments:  COLONOSCOPY AND ESOPHAGOGASTRODUODENOSCOPY (EGD)  ESOPHAGOGASTRODUODENOSCOPY (EGD)  ESOPHAGOGASTRODUODENAL (EGD) BIOPSY  ENDOSCOPIC POLYPECTOMY  RESOLUTION CLIP - lot [id number]  exp 2021-01-21 ascending colon x2    Background:    Preoperative diagnosis: FAMILY HX COLON CANCER/DYSPHAGIA  Postoperative diagnosis: Gastric ulcer Dysphagia  Colon polyps    Operator:  Dr. Tally Joe  Assistant(s): Endoscopy Technician-1: Renard Hamper C  Endoscopy RN-1: Nanda Quinton, RN    Specimens:   ID Type Source Tests Collected by Time Destination   1 : gastric ulcer bx Preservative Gastric  Erling Conte., MD 04/23/2018 1306 Pathology   2 : polyps (4) Preservative Colon, Ascending  Erling Conte., MD 04/23/2018 1317 Pathology     H. Pylori  no    Assessment:  Intra-procedure medications   Anesthesia gave intra-procedure sedation and medications, see anesthesia flow sheet yes    Intravenous fluids: NS@ KVO     Vital signs stable     Abdominal assessment: round and soft     Recommendation:  Discharge patient per MD order.    Family or Friend   Permission to share finding with family or friend yes

## 2018-04-23 NOTE — Procedures (Signed)
[place]  [place], [place]          [facility]- Gastroduodenoscopy (EGD) Procedure Note    Allison Newman  19-Apr-1940  [id number]      Procedure: Endoscopic Gastroduodenoscopy --diagnostic, with biopsy    Indication: dysphagia    Pre-operative Diagnosis: see indication above    Post-operative Diagnosis: see findings below    Operator: River Mckercher P. Elisha Ponder, MD    Referring Provider:  Karlyn Agee, MD      Anesthesia/Sedation:  MAC anesthesia Propofol        Procedure Details     After informed consent was obtained for the procedure, with all risks and benefits of procedure explained the patient was taken to the endoscopy suite and placed in the left lateral decubitus position.  Following sequential administration of sedation as per above, the endoscope was inserted into the mouth and advanced under direct vision to second portion of the duodenum.  A careful inspection was made as the gastroscope was withdrawn, including a retroflexed view of the proximal stomach; findings and interventions are described below.      Findings:   Esophagus:normal  Stomach: single 5-6 mm linear ulcer in gastric body - cold biopsies taken  Duodenum: normal to second portion      Therapies:  As above    Specimens: 1. gastric         EBL: None      Complications:   None; patient tolerated the procedure well.           Impression:    1. Gastric ulcer- biopsied    Recommendations:  1. Follow up surgical pathology  2. Treat for H pylori if positive  3. PPI script provided  4. Avoid non-essential NSAID's  5. Proceed to colonoscopy    Signed By: Birdie Hopes. Elisha Ponder, MD     04/23/2018  1:32 PM

## 2018-04-23 NOTE — Procedures (Signed)
[name]  [place], [place]        Colonoscopy Operative Report    [name]  [id number]  02-17-1941      Procedure Type:   Colonoscopy with polypectomy (snare cautery), polypectomy (cold biopsy)     Indications:    Personal history of colon polyps (screening only)         Pre-operative Diagnosis: see indication above    Post-operative Diagnosis:  See findings below    Operator:  Carilyn Woolston P. Elisha Ponder, MD      Referring Provider: Karlyn Agee, MD      Sedation:  MAC anesthesia Propofol      Procedure Details:  After informed consent was obtained with all risks and benefits of procedure explained and preoperative exam completed, the patient was taken to the endoscopy suite and placed in the left lateral decubitus position.  Upon sequential sedation as per above, a digital rectal exam was performed demonstrating internal hemorrhoids.  The Olympus pediatric videocolonoscope  was inserted in the rectum and carefully advanced to the terminal ileum.  The cecum was identified by the ileocecal valve and appendiceal orifice.  The quality of preparation was good.  The colonoscope was slowly withdrawn with careful evaluation between folds. Retroflexion in the rectum was completed .     Findings:   Rectum: normal  Sigmoid: normal  Descending Colon: normal  Transverse Colon: normal  Ascending Colon: Four polyps were removed. Two were sessile and 10 mm in size - removed with hot snare. Polypectomy site closed with single Resolution 360 clip at each site. Two were 2 mm in size - removed with cold biopsy forceps  Cecum: normal  Terminal Ileum: normal      Specimen Removed: 1. Ascending colon polyps      Complications: None.     EBL:  None.    Impression:   1. Ascending colon polyps - removed  2. Small internal hemorrhoids    Recommendations:  1. Follow up surgical pathology  2. Repeat colonoscopy in 3-5 years based on pathology results  3. High fiber diet education   4. Restart Eliquis on Friday 04/26/18    Signed By: Birdie Hopes. Elisha Ponder, MD     04/23/2018  1:34 PM

## 2018-04-23 NOTE — Anesthesia Post-Procedure Evaluation (Addendum)
Procedure(s):  COLONOSCOPY AND ESOPHAGOGASTRODUODENOSCOPY (EGD)  ESOPHAGOGASTRODUODENOSCOPY (EGD)  ESOPHAGOGASTRODUODENAL (EGD) BIOPSY  ENDOSCOPIC POLYPECTOMY  RESOLUTION CLIP.    MAC    Anesthesia Post Evaluation        Patient location during evaluation: PACU  Patient participation: complete - patient participated  Level of consciousness: awake  Pain management: adequate  Airway patency: patent  Anesthetic complications: no  Cardiovascular status: hemodynamically stable  Respiratory status: acceptable  Hydration status: acceptable  Comments: I have seen and evaluated the patient. The patient is ready for PACU discharge.  Tai Skelly I Angelise Petrich, DO                         Vitals Value Taken Time   BP 105/63 04/23/2018  1:24 PM   Temp     Pulse 110 04/23/2018  1:24 PM   Resp 18 04/23/2018  1:24 PM   SpO2 100 % 04/23/2018  1:24 PM

## 2018-05-08 ENCOUNTER — Encounter: Attending: Internal Medicine | Primary: Geriatric Medicine

## 2018-05-08 ENCOUNTER — Inpatient Hospital Stay: Admit: 2018-05-08 | Payer: MEDICARE | Primary: Geriatric Medicine

## 2018-05-08 DIAGNOSIS — Z1231 Encounter for screening mammogram for malignant neoplasm of breast: Secondary | ICD-10-CM

## 2018-05-10 ENCOUNTER — Encounter

## 2018-05-10 MED ORDER — METOPROLOL TARTRATE 50 MG TAB
50 mg | ORAL_TABLET | ORAL | 1 refills | Status: DC
Start: 2018-05-10 — End: 2018-09-23

## 2018-06-21 MED ORDER — METHIMAZOLE 5 MG TAB
5 mg | ORAL_TABLET | ORAL | 0 refills | Status: DC
Start: 2018-06-21 — End: 2018-08-16

## 2018-08-16 ENCOUNTER — Encounter

## 2018-08-16 MED ORDER — METHIMAZOLE 5 MG TAB
5 mg | ORAL_TABLET | ORAL | 0 refills | Status: AC
Start: 2018-08-16 — End: ?

## 2018-08-19 MED ORDER — BREO ELLIPTA 200 MCG-25 MCG/DOSE POWDER FOR INHALATION
200-25 mcg/dose | RESPIRATORY_TRACT | 1 refills | Status: AC
Start: 2018-08-19 — End: ?

## 2018-08-30 ENCOUNTER — Encounter: Attending: Internal Medicine | Primary: Geriatric Medicine

## 2018-09-12 ENCOUNTER — Encounter

## 2018-09-13 MED ORDER — ROSUVASTATIN 20 MG TAB
20 mg | ORAL_TABLET | ORAL | 1 refills | Status: AC
Start: 2018-09-13 — End: ?

## 2018-09-13 MED ORDER — METFORMIN 1,000 MG TAB
1000 mg | ORAL_TABLET | ORAL | 1 refills | Status: AC
Start: 2018-09-13 — End: ?

## 2018-09-21 ENCOUNTER — Encounter

## 2018-09-23 MED ORDER — METOPROLOL TARTRATE 50 MG TAB
50 mg | ORAL_TABLET | ORAL | 1 refills | Status: AC
Start: 2018-09-23 — End: ?

## 2018-09-30 DIAGNOSIS — K219 Gastro-esophageal reflux disease without esophagitis: Secondary | ICD-10-CM | POA: Diagnosis not present

## 2018-09-30 DIAGNOSIS — M4800 Spinal stenosis, site unspecified: Secondary | ICD-10-CM | POA: Diagnosis not present

## 2018-09-30 DIAGNOSIS — Z0000 Encounter for general adult medical examination without abnormal findings: Secondary | ICD-10-CM | POA: Diagnosis not present

## 2018-09-30 DIAGNOSIS — I10 Essential (primary) hypertension: Secondary | ICD-10-CM | POA: Diagnosis not present

## 2018-09-30 DIAGNOSIS — Z1389 Encounter for screening for other disorder: Secondary | ICD-10-CM | POA: Diagnosis not present

## 2018-09-30 DIAGNOSIS — E7800 Pure hypercholesterolemia, unspecified: Secondary | ICD-10-CM | POA: Diagnosis not present

## 2018-09-30 DIAGNOSIS — E2839 Other primary ovarian failure: Secondary | ICD-10-CM | POA: Diagnosis not present

## 2018-10-01 DIAGNOSIS — I10 Essential (primary) hypertension: Secondary | ICD-10-CM | POA: Diagnosis not present

## 2018-10-01 DIAGNOSIS — E7800 Pure hypercholesterolemia, unspecified: Secondary | ICD-10-CM | POA: Diagnosis not present

## 2018-10-24 DIAGNOSIS — M961 Postlaminectomy syndrome, not elsewhere classified: Secondary | ICD-10-CM | POA: Diagnosis not present

## 2018-10-24 DIAGNOSIS — I10 Essential (primary) hypertension: Secondary | ICD-10-CM | POA: Diagnosis not present

## 2018-10-24 DIAGNOSIS — M47816 Spondylosis without myelopathy or radiculopathy, lumbar region: Secondary | ICD-10-CM | POA: Diagnosis not present

## 2018-10-24 DIAGNOSIS — M48062 Spinal stenosis, lumbar region with neurogenic claudication: Secondary | ICD-10-CM | POA: Diagnosis not present

## 2018-10-24 DIAGNOSIS — Z6832 Body mass index (BMI) 32.0-32.9, adult: Secondary | ICD-10-CM | POA: Diagnosis not present

## 2018-11-05 DIAGNOSIS — M47816 Spondylosis without myelopathy or radiculopathy, lumbar region: Secondary | ICD-10-CM | POA: Diagnosis not present

## 2018-11-10 DIAGNOSIS — R69 Illness, unspecified: Secondary | ICD-10-CM | POA: Diagnosis not present

## 2018-12-27 ENCOUNTER — Encounter

## 2018-12-27 MED ORDER — ELIQUIS 5 MG TABLET
5 mg | ORAL_TABLET | ORAL | 1 refills | Status: DC
Start: 2018-12-27 — End: 2019-12-10

## 2019-01-15 DIAGNOSIS — Z961 Presence of intraocular lens: Secondary | ICD-10-CM | POA: Diagnosis not present

## 2019-01-15 DIAGNOSIS — H5201 Hypermetropia, right eye: Secondary | ICD-10-CM | POA: Diagnosis not present

## 2019-01-15 DIAGNOSIS — H52223 Regular astigmatism, bilateral: Secondary | ICD-10-CM | POA: Diagnosis not present

## 2019-01-15 DIAGNOSIS — Z0100 Encounter for examination of eyes and vision without abnormal findings: Secondary | ICD-10-CM | POA: Diagnosis not present

## 2019-01-15 DIAGNOSIS — H524 Presbyopia: Secondary | ICD-10-CM | POA: Diagnosis not present

## 2019-01-15 DIAGNOSIS — H5212 Myopia, left eye: Secondary | ICD-10-CM | POA: Diagnosis not present

## 2019-01-15 DIAGNOSIS — H2703 Aphakia, bilateral: Secondary | ICD-10-CM | POA: Diagnosis not present

## 2019-03-03 ENCOUNTER — Encounter

## 2019-03-06 DIAGNOSIS — M179 Osteoarthritis of knee, unspecified: Secondary | ICD-10-CM | POA: Diagnosis not present

## 2019-03-06 DIAGNOSIS — E7800 Pure hypercholesterolemia, unspecified: Secondary | ICD-10-CM | POA: Diagnosis not present

## 2019-03-06 DIAGNOSIS — I10 Essential (primary) hypertension: Secondary | ICD-10-CM | POA: Diagnosis not present

## 2019-03-18 DIAGNOSIS — M25512 Pain in left shoulder: Secondary | ICD-10-CM | POA: Diagnosis not present

## 2019-03-18 DIAGNOSIS — M7552 Bursitis of left shoulder: Secondary | ICD-10-CM | POA: Diagnosis not present

## 2019-03-30 DIAGNOSIS — M179 Osteoarthritis of knee, unspecified: Secondary | ICD-10-CM | POA: Diagnosis not present

## 2019-03-30 DIAGNOSIS — I10 Essential (primary) hypertension: Secondary | ICD-10-CM | POA: Diagnosis not present

## 2019-03-30 DIAGNOSIS — E7800 Pure hypercholesterolemia, unspecified: Secondary | ICD-10-CM | POA: Diagnosis not present

## 2019-04-12 ENCOUNTER — Encounter

## 2019-04-24 DIAGNOSIS — I10 Essential (primary) hypertension: Secondary | ICD-10-CM | POA: Diagnosis not present

## 2019-04-24 DIAGNOSIS — E7800 Pure hypercholesterolemia, unspecified: Secondary | ICD-10-CM | POA: Diagnosis not present

## 2019-04-24 DIAGNOSIS — M179 Osteoarthritis of knee, unspecified: Secondary | ICD-10-CM | POA: Diagnosis not present

## 2019-07-07 DIAGNOSIS — M1990 Unspecified osteoarthritis, unspecified site: Secondary | ICD-10-CM | POA: Diagnosis not present

## 2019-07-07 DIAGNOSIS — Z008 Encounter for other general examination: Secondary | ICD-10-CM | POA: Diagnosis not present

## 2019-07-07 DIAGNOSIS — I10 Essential (primary) hypertension: Secondary | ICD-10-CM | POA: Diagnosis not present

## 2019-07-07 DIAGNOSIS — G8929 Other chronic pain: Secondary | ICD-10-CM | POA: Diagnosis not present

## 2019-07-07 DIAGNOSIS — Z6837 Body mass index (BMI) 37.0-37.9, adult: Secondary | ICD-10-CM | POA: Diagnosis not present

## 2019-07-07 DIAGNOSIS — I739 Peripheral vascular disease, unspecified: Secondary | ICD-10-CM | POA: Diagnosis not present

## 2019-07-07 DIAGNOSIS — K5900 Constipation, unspecified: Secondary | ICD-10-CM | POA: Diagnosis not present

## 2019-07-07 DIAGNOSIS — K219 Gastro-esophageal reflux disease without esophagitis: Secondary | ICD-10-CM | POA: Diagnosis not present

## 2019-07-07 DIAGNOSIS — E785 Hyperlipidemia, unspecified: Secondary | ICD-10-CM | POA: Diagnosis not present

## 2019-07-07 DIAGNOSIS — M4800 Spinal stenosis, site unspecified: Secondary | ICD-10-CM | POA: Diagnosis not present

## 2019-07-11 DIAGNOSIS — M179 Osteoarthritis of knee, unspecified: Secondary | ICD-10-CM | POA: Diagnosis not present

## 2019-07-11 DIAGNOSIS — I10 Essential (primary) hypertension: Secondary | ICD-10-CM | POA: Diagnosis not present

## 2019-07-11 DIAGNOSIS — E7800 Pure hypercholesterolemia, unspecified: Secondary | ICD-10-CM | POA: Diagnosis not present

## 2019-10-03 DIAGNOSIS — I10 Essential (primary) hypertension: Secondary | ICD-10-CM | POA: Diagnosis not present

## 2019-10-07 ENCOUNTER — Other Ambulatory Visit: Payer: Self-pay | Admitting: Internal Medicine

## 2019-10-07 DIAGNOSIS — I10 Essential (primary) hypertension: Secondary | ICD-10-CM | POA: Diagnosis not present

## 2019-10-07 DIAGNOSIS — Z0000 Encounter for general adult medical examination without abnormal findings: Secondary | ICD-10-CM | POA: Diagnosis not present

## 2019-10-07 DIAGNOSIS — E2839 Other primary ovarian failure: Secondary | ICD-10-CM

## 2019-10-07 DIAGNOSIS — E7800 Pure hypercholesterolemia, unspecified: Secondary | ICD-10-CM | POA: Diagnosis not present

## 2019-10-07 DIAGNOSIS — Z1231 Encounter for screening mammogram for malignant neoplasm of breast: Secondary | ICD-10-CM

## 2019-10-07 DIAGNOSIS — Z1389 Encounter for screening for other disorder: Secondary | ICD-10-CM | POA: Diagnosis not present

## 2019-10-07 DIAGNOSIS — Z23 Encounter for immunization: Secondary | ICD-10-CM | POA: Diagnosis not present

## 2019-10-10 DIAGNOSIS — Z20828 Contact with and (suspected) exposure to other viral communicable diseases: Secondary | ICD-10-CM | POA: Diagnosis not present

## 2019-11-04 DIAGNOSIS — I10 Essential (primary) hypertension: Secondary | ICD-10-CM | POA: Diagnosis not present

## 2019-11-05 DIAGNOSIS — R200 Anesthesia of skin: Secondary | ICD-10-CM | POA: Diagnosis not present

## 2019-11-05 DIAGNOSIS — R42 Dizziness and giddiness: Secondary | ICD-10-CM | POA: Diagnosis not present

## 2019-11-25 DIAGNOSIS — G629 Polyneuropathy, unspecified: Secondary | ICD-10-CM | POA: Diagnosis not present

## 2019-12-04 DIAGNOSIS — R69 Illness, unspecified: Secondary | ICD-10-CM | POA: Diagnosis not present

## 2019-12-04 DIAGNOSIS — I10 Essential (primary) hypertension: Secondary | ICD-10-CM | POA: Diagnosis not present

## 2019-12-08 ENCOUNTER — Emergency Department: Admit: 2019-12-09 | Payer: MEDICARE | Primary: Geriatric Medicine

## 2019-12-08 DIAGNOSIS — U071 COVID-19: Secondary | ICD-10-CM

## 2019-12-08 LAB — METABOLIC PANEL, COMPREHENSIVE
A-G Ratio: 0.6 — ABNORMAL LOW (ref 1.1–2.2)
ALT (SGPT): 17 U/L (ref 12–78)
AST (SGOT): 43 U/L — ABNORMAL HIGH (ref 15–37)
Albumin: 3 g/dL — ABNORMAL LOW (ref 3.5–5.0)
Alk. phosphatase: 50 U/L (ref 45–117)
Anion gap: 5 mmol/L (ref 5–15)
BUN/Creatinine ratio: 25 — ABNORMAL HIGH (ref 12–20)
BUN: 20 MG/DL (ref 6–20)
Bilirubin, total: 0.6 MG/DL (ref 0.2–1.0)
CO2: 25 mmol/L (ref 21–32)
Calcium: 8.6 MG/DL (ref 8.5–10.1)
Chloride: 107 mmol/L (ref 97–108)
Creatinine: 0.81 MG/DL (ref 0.55–1.02)
GFR est AA: >60 mL/min/{1.73_m2} (ref >60)
GFR est non-AA: >60 mL/min/{1.73_m2} (ref >60)
Globulin: 4.9 g/dL — ABNORMAL HIGH (ref 2.0–4.0)
Glucose: 158 mg/dL — ABNORMAL HIGH (ref 65–100)
Potassium: 5.1 mmol/L (ref 3.5–5.1)
Protein, total: 7.9 g/dL (ref 6.4–8.2)
Sodium: 137 mmol/L (ref 136–145)

## 2019-12-08 LAB — NT-PRO BNP: NT pro-BNP: 651 PG/ML — ABNORMAL HIGH (ref <450)

## 2019-12-08 LAB — SAMPLES BEING HELD

## 2019-12-08 LAB — COMPREHENSIVE METABOLIC PANEL
ALT: 17 U/L (ref 12–78)
AST: 43 U/L — ABNORMAL HIGH (ref 15–37)
Albumin/Globulin Ratio: 0.6 — ABNORMAL LOW (ref 1.1–2.2)
Albumin: 3 g/dL — ABNORMAL LOW (ref 3.5–5.0)
Alkaline Phosphatase: 50 U/L (ref 45–117)
Anion Gap: 5 mmol/L (ref 5–15)
BUN: 20 MG/DL (ref 6–20)
Bun/Cre Ratio: 25 — ABNORMAL HIGH (ref 12–20)
CO2: 25 mmol/L (ref 21–32)
Calcium: 8.6 MG/DL (ref 8.5–10.1)
Chloride: 107 mmol/L (ref 97–108)
Creatinine: 0.81 MG/DL (ref 0.55–1.02)
EGFR IF NonAfrican American: >60 mL/min/{1.73_m2} (ref >60)
GFR African American: >60 mL/min/{1.73_m2} (ref >60)
Globulin: 4.9 g/dL — ABNORMAL HIGH (ref 2.0–4.0)
Glucose: 158 mg/dL — ABNORMAL HIGH (ref 65–100)
Potassium: 5.1 mmol/L (ref 3.5–5.1)
Sodium: 137 mmol/L (ref 136–145)
Total Bilirubin: 0.6 MG/DL (ref 0.2–1.0)
Total Protein: 7.9 g/dL (ref 6.4–8.2)

## 2019-12-08 LAB — PROBNP, N-TERMINAL: BNP: 651 PG/ML — ABNORMAL HIGH (ref <450)

## 2019-12-08 NOTE — ED Notes (Signed)
She arrives after calling [facility] for coughing for several days. They did a positive rapid COVID test and said her oxygen levels were very low and she should go to the ED now. She denies any other symptoms. Placed on oxygen by nasal cannula in triage for oxygen saturations of 87. She had the COVID vaccine x2

## 2019-12-08 NOTE — H&P (Signed)
[facility]  HISTORY AND PHYSICAL    Name:  Allison Newman, Allison Newman  MR#:  [id number]  DOB:  08/04/1940  ACCOUNT #:  [id number]  ADMIT DATE:  12/08/2019      The patient was seen, evaluated, and admitted by me on 12/08/2019.    PRIMARY CARE PHYSICIAN:  Tyna Jaksch, MD    SOURCE OF INFORMATION:  Patient and review of ED and old electronic medical record.    CHIEF COMPLAINT:  Shortness of breath.    HISTORY OF PRESENT ILLNESS:  This is a 79 year old woman with a past medical history significant for COPD, type 2 diabetes, hypertension, dyslipidemia, hyperthyroidism, pulmonary embolism, who was in her usual state of health until a few days ago when the patient developed shortness of breath.  This is progressive and getting worse.  The shortness of breath is associated with cough which is nonproductive.  The patient stated that she is fully vaccinated against COVID-19 virus infection.  Because of worsening symptoms, the patient called the [facility].  The [facility] came to the house.  The patient was tested for COVID, the Allison Newman was positive.  Because the patient has oxygen requirement, the patient was advised to go to the emergency room for further evaluation and treatment.  The patient has a history of COPD but she is not on oxygen at home.  The patient denies fever, rigors, or chills.  No history of congestive heart failure.  When the patient arrived at the emergency room, the patient was in significant respiratory distress and required being placed on high-flow oxygen.  The patient was last admitted to this hospital from 06/07/2017 to 06/11/2017.  The patient was admitted and treated for acute respiratory failure secondary to bilateral pulmonary embolism.  The patient was discharged home on Eliquis.  The patient stated that she finished the course of Eliquis for about 6 months and presently she is not on any anticoagulation.  The patient stated that the shortness of breath is worse with activity  and also with mild exertion.    PAST MEDICAL HISTORY:  COPD, type 2 diabetes, hypertension, dyslipidemia, hypothyroidism, and pulmonary embolism.    ALLERGIES:  THE PATIENT IS ALLERGIC TO ALMOND OIL AND NUTS.    MEDICATIONS:  Aspirin 81 mg daily, Breo inhalation twice daily, lisinopril 10 mg daily, Glucophage 1000 mg twice daily, Tapazole 5 mg daily, Lopressor 50 mg twice daily, oxycodone 5 mg every 4 hours as needed for pain, Protonix 40 mg daily, Crestor 20 mg daily, and Dyazide 37.5/25 one tablet daily.    FAMILY HISTORY:  This was reviewed.  Her mother had diabetes and heart disease.  Her father had lung cancer.    PAST SURGICAL HISTORY:  This is significant for right knee replacement and tubal ligation.    SOCIAL HISTORY:  The patient is a former smoker, quit tobacco abuse in 05/1991.  No history of alcohol abuse.    REVIEW OF SYSTEMS:  HEAD, EYES, EARS, NOSE, AND THROAT:  No headache, no dizziness, no blurring of vision, no photophobia.  RESPIRATORY:  This is positive for shortness of breath and cough.  No hemoptysis.  CARDIOVASCULAR:  No chest pain, no orthopnea, no palpitation.  GASTROINTESTINAL:  No nausea or vomiting.  No diarrhea.  No constipation.  GENITOURINARY:  No dysuria, no urgency, and no frequency.    All other systems are reviewed and they are negative.    PHYSICAL EXAMINATION:  GENERAL APPEARANCE:  The patient appeared ill, in moderate distress.  VITAL SIGNS:  On arrival at the emergency room, temperature 98.8, pulse 90, respiratory rate 30, blood pressure 163/87, and oxygen saturation 87%.  HEAD:  Normocephalic, atraumatic.  EYES:  Normal eye movement.  No redness, no drainage, no discharge.  EARS:  Normal external ears with no obvious drainage.  NOSE:  No deformity and no drainage.  MOUTH AND THROAT:  No visible oral lesion.  NECK:  Neck is supple.  No JVD, no thyromegaly.  CHEST:  Diffuse expiratory wheezing.  No crackles.  HEART:  Normal S1 and S2, regular.  No clinically appreciable  murmur.  ABDOMEN:  Soft, obese, nontender.  Normal bowel sounds.  CENTRAL VENOUS SYSTEM:  Alert and oriented x3.  No gross focal neurological deficit.  EXTREMITIES:  Edema 2+.  Pulses 2+ bilaterally.  MUSCULOSKELETAL:  No obvious joint deformity or swelling.  SKIN:  No active skin lesions seen in the exposed parts of the body.  PSYCHIATRIC:  Normal mood and affect.  LYMPHATIC:  No cervical lymphadenopathy.    DIAGNOSTIC DATA:  Chest x-ray, no definitive acute findings.  EKG shows normal sinus rhythm and nonspecific ST and T-wave abnormalities.    LABORATORY DATA:  Pro-BNP level 651.  Chemistry, sodium 137, potassium 5.1, chloride 107, CO2 of 25, glucose 158, BUN 20, creatinine 0.81, calcium 8.6.  Total bilirubin 0.6, ALT 17, AST 43, alkaline phosphatase 50, total protein 7.9, albumin level 3.0, globulin 4.9.  Hematology, WBC 3.6, hemoglobin 13.3, hematocrit 43.1, and platelets 124.  Procalcitonin level less than 0.05.  D-dimer 1.64.  ABG done in the emergency room, pH 7.38, pCO2 of 43, pO2 of 57, bicarbonate 25.5.  Urinalysis, this is significant for 1+ bacteria, negative nitrites, trace leukocyte esterase, small blood.    ASSESSMENT:  1.  Acute respiratory failure with hypoxia.  2.  COVID-19 virus infection.  3.  Chronic obstructive pulmonary disease with acute exacerbation.  4.  Type 2 diabetes.  5.  Hypertension.  6.  Morbid obesity.  7.  Dyslipidemia.  8.  Hyperthyroidism.  9.  Elevated D-dimer.  10.  Acute cystitis with hematuria.  11.  Thrombocytopenia.  12.  Suspected acute congestive heart failure.    PLAN:  1.  Acute respiratory failure with hypoxia.  We will admit the patient for further evaluation and treatment.  We will identify and treat the underlying etiological factors which include COVID-19 virus infection, COPD with acute exacerbation, possible congestive heart failure.  This will be discussed below.  We will continue with supplemental oxygen.  We will check serial cardiac markers to rule out acute  myocardial infarction as another possible cause of acute respiratory failure with hypoxia.  We will monitor the patient closely.  We will obtain CTA of the chest to rule out pulmonary embolism, especially in this patient who has had bilateral pulmonary embolism in the past and was treated with Eliquis.  The patient is no longer on Eliquis.  2.  COVID-19 virus infection.  We will start the patient on Decadron.  The patient has significant oxygen requirement.  The patient will require evaluation for remdesivir therapy if indicated.  This is contributing to the patient's acute respiratory failure with hypoxia.  3.  COPD with acute exacerbation.  The patient will be started on steroid and DuoNeb as needed, doxycycline for any underlying chest infection.  4.  Type 2 diabetes.  The patient will be placed on sliding scale with insulin coverage.  We will check hemoglobin A1c level.  We will hold oral agent  till the time of discharge from the hospital.  5.  Hypertension.  We will continue with preadmission medication and monitor the patient's blood pressure closely.  6.  Morbid obesity.  Diet and exercise advised.  Consider inpatient dietary consult if indicated.  7.  Dyslipidemia.  We will continue with Crestor.  8.  Hyperthyroidism.  We will continue with Tapazole and check TSH level.  9.  Elevated D-dimer.  We will check ultrasound of the lower extremity for DVT.  10.  Acute cystitis with hematuria.  We will start the patient on Rocephin.  We will await urine culture.  11.  Thrombocytopenia.  This is mild.  We will continue to monitor the patient's platelet count.  12.  Suspected acute congestive heart failure.  We will start the patient on Lasix.  We will obtain echocardiogram to determine the ejection fraction and the type of congestive heart failure.  The patient may require Cardiology consult if the echocardiogram is consistent with heart failure.  13.  Other issues:  Code status, the patient is a full code.  We will  place the patient on Lovenox for DVT prophylaxis as per COVID patient protocol.    FUNCTIONAL STATUS PRIOR TO ADMISSION:  The patient came from home.  The patient is ambulatory with assistive device.    COVID PRECAUTION:  The patient was wearing a face mask.  I was wearing cap, face shield, gown, gloves, and N95 face mask for this patient's encounter.        Noralyn Pick, MD      [id number]  D:  12/09/2019 8:23  T:  12/09/2019 9:37  JOB #:  [id number]  CC:  Tyna Jaksch, MD

## 2019-12-08 NOTE — ED Provider Notes (Signed)
Please note that this dictation was completed with Dragon, the computer voice recognition software. ??Quite often unanticipated grammatical, syntax, homophones, and other interpretive errors are inadvertently transcribed by the computer software. ??Please disregard these errors. ??Please excuse any errors that have escaped final proofreading.    79 year old female past medical history Mark for abdominal pain, abnormal EKG, advance care planning, acute PE, arthritis, chronic kidney disease stage II, chronic pain disorder, COPD, diabetes, elevated BUN, GERD, hypertension, morbid obesity, neuropathic arthritis, skin ulcer secondary to diabetes, umbilical hernia and slow transit constipation presents ER complaining of "been coughing for several days.  No big deal I have COPD I am used to coughing.  We called dispatch health today they came out they checked me they said my O2 sat was low and they did a Covid swab and they said it was positive.  I do not have home oxygen not never been on home oxygen.  I am not a smoker.  They told me needed to come to the ER and be admitted and evaluated for oxygen therapy."  Patient states she has not had a fever recently has been tolerating p.o. normally normal bladder bowel habits.  Patient was brought to the ER by POV POV for further evaluation of acute hypoxia and Covid positive status.  Patient states she has been vaccinated against Covid has been waiting to get her booster.    pt denies HA, vison changes, diff swallowing, CP,  Abd pain, F/Ch, N/V, D/Cons or other current systemic complaints    Past Surgical History:  04/23/2018: COLONOSCOPY; N/A      Comment:  COLONOSCOPY AND ESOPHAGOGASTRODUODENOSCOPY (EGD)                performed by Erling Conte., MD at [facility] ENDOSCOPY  1998: HX ORTHOPAEDIC      Comment:  knee replacement right  2000: HX ORTHOPAEDIC      Comment:  screw in foot left  No date: HX OTHER SURGICAL      Comment:  colonoscopy  No date: HX TUBAL LIGATION    Social/ PSH  reviewed in EMR    EMR Chart Reviewed           Past Medical History:   Diagnosis Date   ??? Abdominal pain 05/08/2013   ??? Abnormal finding on EKG 11/17/2013   ??? ACP (advance care planning) 05/03/2015   ??? Acute pulmonary embolism (HCC) 06/08/2017   ??? Arthritis    ??? 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 Chronic pain of left knee 08/13/2014   ??? Chronic right hip pain 03/17/2013   ??? 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 Diabetes (HCC)    ??? Elevated BUN 09/13/2011   ??? Finger lesion 03/02/2016   ??? Gallbladder polyp 04/08/2013   ??? Gastrointestinal disorder     GERD   ??? GERD (gastroesophageal reflux disease)    ??? Hypercholesterolemia    ??? Hypertension    ??? 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 Microalbuminuria 08/29/2013   ??? 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 On aspirin at home 05/03/2015   ??? Other unknown and unspecified cause of morbidity or mortality     hx of bronchitis   ??? Rash, skin 09/25/2012   ??? 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  Slow transit constipation 08/13/2014   ??? Umbilical hernia 05/08/2013       Past Surgical History:   Procedure Laterality Date   ??? COLONOSCOPY N/A 04/23/2018    COLONOSCOPY AND ESOPHAGOGASTRODUODENOSCOPY (EGD) performed by Erling Contealreja, Jayant P., [facility]   ??? 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 HX ORTHOPAEDIC  2000    screw in foot left   ??? HX OTHER SURGICAL      colonoscopy   ??? HX TUBAL LIGATION           Family History:   Problem Relation Age of Onset   ??? Diabetes Mother    ??? Heart Disease Mother         rheumatic fever   ??? Breast Cancer Mother    ??? Cancer Father         lung   ??? Cancer Sister         colon & breast   ??? Breast Cancer Sister    ??? Cancer Brother         throat       Social History     Socioeconomic History   ??? Marital status: DIVORCED     Spouse  name: Not on file   ??? Number of children: Not on file   ??? Years of education: Not on file   ??? Highest education level: Not on file   Occupational History   ??? Not on file   Tobacco Use   ??? Smoking status: Former Smoker     Packs/day: 0.25     Years: 1.00     Pack years: 0.25     Types: Cigarettes     Quit date: 06/03/1991     Years since quitting: 28.5   ??? Smokeless tobacco: Never Used   Substance and Sexual Activity   ??? Alcohol use: No     Alcohol/week: 0.0 standard drinks   ??? Drug use: No   ??? Sexual activity: Never   Other Topics Concern   ??? Not on file   Social History Narrative   ??? Not on file     Social Determinants of Health     Financial Resource Strain:    ??? Difficulty of Paying Living Expenses:    Food Insecurity:    ??? Worried About Programme researcher, broadcasting/film/videounning Out of Food in the Last Year:    ??? Baristaan Out of Food in the Last Year:    Transportation Needs:    ??? Freight forwarderLack of Transportation (Medical):    ??? Lack of Transportation (Non-Medical):    Physical Activity:    ??? Days of Exercise per Week:    ??? Minutes of Exercise per Session:    Stress:    ??? Feeling of Stress :    Social Connections:    ??? Frequency of Communication with Friends and Family:    ??? Frequency of Social Gatherings with Friends and Family:    ??? Attends Religious Services:    ??? Database administratorActive Member of Clubs or Organizations:    ??? Attends Engineer, structuralClub or Organization Meetings:    ??? Marital Status:    Intimate Programme researcher, broadcasting/film/videoartner Violence:    ??? Fear of Current or Ex-Partner:    ??? Emotionally Abused:    ??? Physically Abused:    ??? Sexually Abused:          ALLERGIES: Almond oil and Nuts [tree nut]    Review of Systems   Constitutional: Negative for  chills and fever.   HENT: Negative for drooling, sore throat, trouble swallowing and voice change.    Eyes: Negative for visual disturbance.   Respiratory: Positive for cough. Negative for chest tightness, shortness of breath, wheezing and stridor.    Cardiovascular: Negative for chest pain, palpitations and leg swelling.   Gastrointestinal: Negative for  abdominal pain, constipation, diarrhea, nausea and vomiting.   Genitourinary: Negative for dysuria, vaginal bleeding and vaginal discharge.   Musculoskeletal: Negative for back pain.   Skin: Negative for rash.   Neurological: Negative for speech difficulty and headaches.   Psychiatric/Behavioral: Negative for confusion.   All other systems reviewed and are negative.      Vitals:    12/08/19 1724 12/08/19 1731 12/08/19 2102   BP: (!) 163/87  (!) 179/114   Pulse: 90  76   Resp: 30  25   Temp: 98.8 ??F (37.1 ??C)  99.7 ??F (37.6 ??C)   SpO2: (!) 87% 93% 92%            Physical Exam  Vitals and nursing note reviewed.   Constitutional:       General: She is not in acute distress.     Appearance: Normal appearance. She is well-developed. She is obese. She is not ill-appearing, toxic-appearing or diaphoretic.      Comments: NAD, AxOx4, speaking in complete sentences  On NC    HENT:      Head: Normocephalic and atraumatic.      Right Ear: External ear normal.      Left Ear: External ear normal.   Eyes:      General: No scleral icterus.        Right eye: No discharge.         Left eye: No discharge.      Extraocular Movements: Extraocular movements intact.      Conjunctiva/sclera: Conjunctivae normal.      Pupils: Pupils are equal, round, and reactive to light.   Neck:      Vascular: No JVD.      Trachea: No tracheal deviation.   Cardiovascular:      Rate and Rhythm: Normal rate and regular rhythm.      Pulses: Normal pulses.      Heart sounds: Normal heart sounds. No murmur heard.   No friction rub. No gallop.    Pulmonary:      Effort: Pulmonary effort is normal. No respiratory distress.      Breath sounds: Normal breath sounds. No stridor. No wheezing, rhonchi or rales.   Chest:      Chest wall: No tenderness.   Abdominal:      General: Bowel sounds are normal.      Palpations: Abdomen is soft.      Tenderness: There is no abdominal tenderness. There is no guarding or rebound.      Hernia: No hernia is present.      Comments:  nttp       Genitourinary:     Vagina: No vaginal discharge.      Comments: Pt denies urinary/ vaginal complaints  Musculoskeletal:         General: No swelling, tenderness, deformity or signs of injury. Normal range of motion.      Cervical back: Normal range of motion and neck supple. No tenderness.      Right lower leg: Edema present.      Left lower leg: Edema present.   Skin:     General: Skin is warm  and dry.      Capillary Refill: Capillary refill takes less than 2 seconds.      Coloration: Skin is not pale.      Findings: No bruising, erythema or rash.   Neurological:      General: No focal deficit present.      Mental Status: She is alert and oriented to person, place, and time. Mental status is at baseline.      Cranial Nerves: No cranial nerve deficit.      Sensory: No sensory deficit.      Motor: No weakness or abnormal muscle tone.      Coordination: Coordination normal.      Gait: Gait abnormal.      Deep Tendon Reflexes: Reflexes normal.   Psychiatric:         Behavior: Behavior normal.         Thought Content: Thought content normal.          MDM  Number of Diagnoses or Management Options         Procedures    Chief Complaint   Patient presents with   ??? Positive For Covid-19   ??? Shortness of Breath       11:08 PM  The patients presenting problems have been discussed, and they are in agreement with the care plan formulated and outlined with them.  I have encouraged them to ask questions as they arise throughout their visit.    MEDICATIONS GIVEN:  Medications   sodium chloride (NS) flush 5-10 mL (has no administration in time range)   sodium chloride 0.9 % bolus infusion 1,000 mL (1,000 mL IntraVENous New Bag 12/08/19 2148)   acetaminophen (TYLENOL) tablet 650 mg (has no administration in time range)     Or   acetaminophen (TYLENOL) suppository 650 mg (has no administration in time range)       LABS REVIEWED:  Labs Reviewed   METABOLIC PANEL, COMPREHENSIVE - Abnormal; Notable for the following  components:       Result Value    Glucose 158 (*)     BUN/Creatinine ratio 25 (*)     AST (SGOT) 43 (*)     Albumin 3.0 (*)     Globulin 4.9 (*)     A-G Ratio 0.6 (*)     All other components within normal limits   NT-PRO BNP - Abnormal; Notable for the following components:    NT pro-BNP 651 (*)     All other components within normal limits   CBC WITH AUTOMATED DIFF - Abnormal; Notable for the following components:    PLATELET 124 (*)     MONOCYTES 14 (*)     All other components within normal limits   D DIMER - Abnormal; Notable for the following components:    D-dimer 1.64 (*)     All other components within normal limits   C REACTIVE PROTEIN, QT - Abnormal; Notable for the following components:    C-Reactive protein 2.92 (*)     All other components within normal limits   CULTURE, BLOOD   CULTURE, BLOOD   SAMPLES BEING HELD   LACTIC ACID   PROCALCITONIN   MAGNESIUM   URINALYSIS W/ RFLX MICROSCOPIC   LACTIC ACID   FIBRINOGEN   LD   FERRITIN   D DIMER   C REACTIVE PROTEIN, QT       RADIOLOGY RESULTS:  The following have been ordered and reviewed:  _____________________________________________________________________  _____________________________________________________________________  EKG interpretation:   Rhythm: normal sinus rhythm; and regular . Rate (approx.): 84; Axis: RAD; P wave: normal; QRS interval: normal ; ST/T wave: normal; Negative acute significant segmental elevations/ compared to prior study 09/24/2017    PROCEDURES:        CONSULTATIONS:       PROGRESS NOTES:      DIAGNOSIS:    1. Hypoxia    2. COVID        PLAN:  1-      ED COURSE: The patients hospital course has been uncomplicated.    Perfect Serve Consult for Admission  11:13 PM    ED Room Number: ER16/16  Patient Name and age:  Allison Newman 79 y.o.  female  Working Diagnosis:   1. Hypoxia    2. COVID        COVID-19 Suspicion:  yes  Sepsis present:  no  Reassessment needed: yes  Code Status:  Full Code  Readmission: no  Isolation  Requirements:  yes  Recommended Level of Care:  telemetry  Department:[facility] Adult ED - [phone]  Other:  COVID (+) / hypoxia today; given decadron

## 2019-12-09 ENCOUNTER — Inpatient Hospital Stay: Admit: 2019-12-09 | Payer: MEDICARE | Primary: Geriatric Medicine

## 2019-12-09 ENCOUNTER — Inpatient Hospital Stay
Admit: 2019-12-09 | Discharge: 2019-12-16 | Disposition: A | Discharge status: Home Health | Payer: MEDICARE | Attending: Internal Medicine | Admitting: Internal Medicine

## 2019-12-09 LAB — METABOLIC PANEL, COMPREHENSIVE
A-G Ratio: 0.7 — ABNORMAL LOW (ref 1.1–2.2)
ALT (SGPT): 15 U/L (ref 12–78)
AST (SGOT): 20 U/L (ref 15–37)
Albumin: 2.8 g/dL — ABNORMAL LOW (ref 3.5–5.0)
Alk. phosphatase: 43 U/L — ABNORMAL LOW (ref 45–117)
Anion gap: 9 mmol/L (ref 5–15)
BUN/Creatinine ratio: 28 — ABNORMAL HIGH (ref 12–20)
BUN: 16 MG/DL (ref 6–20)
Bilirubin, total: 0.4 MG/DL (ref 0.2–1.0)
CO2: 22 mmol/L (ref 21–32)
Calcium: 8.1 MG/DL — ABNORMAL LOW (ref 8.5–10.1)
Chloride: 108 mmol/L (ref 97–108)
Creatinine: 0.57 MG/DL (ref 0.55–1.02)
GFR est AA: >60 mL/min/{1.73_m2} (ref >60)
GFR est non-AA: >60 mL/min/{1.73_m2} (ref >60)
Globulin: 3.9 g/dL (ref 2.0–4.0)
Glucose: 149 mg/dL — ABNORMAL HIGH (ref 65–100)
Potassium: 3.8 mmol/L (ref 3.5–5.1)
Protein, total: 6.7 g/dL (ref 6.4–8.2)
Sodium: 139 mmol/L (ref 136–145)

## 2019-12-09 LAB — CBC WITH AUTOMATED DIFF
ABS. BASOPHILS: 0 10*3/uL (ref 0.0–0.1)
ABS. BASOPHILS: 0 10*3/uL (ref 0.0–0.1)
ABS. EOSINOPHILS: 0 10*3/uL (ref 0.0–0.4)
ABS. EOSINOPHILS: 0 10*3/uL (ref 0.0–0.4)
ABS. IMM. GRANS.: 0 10*3/uL (ref 0.00–0.04)
ABS. IMM. GRANS.: 0 10*3/uL (ref 0.00–0.04)
ABS. LYMPHOCYTES: 0.9 10*3/uL (ref 0.8–3.5)
ABS. LYMPHOCYTES: 0.5 10*3/uL — ABNORMAL LOW (ref 0.8–3.5)
ABS. MONOCYTES: 0.5 10*3/uL (ref 0.0–1.0)
ABS. MONOCYTES: 0.1 10*3/uL (ref 0.0–1.0)
ABS. NEUTROPHILS: 2.2 10*3/uL (ref 1.8–8.0)
ABS. NEUTROPHILS: 2.1 10*3/uL (ref 1.8–8.0)
ABSOLUTE NRBC: 0 10*3/uL (ref 0.00–0.01)
ABSOLUTE NRBC: 0 10*3/uL (ref 0.00–0.01)
BASOPHILS: 0 % (ref 0–1)
BASOPHILS: 0 % (ref 0–1)
EOSINOPHILS: 1 % (ref 0–7)
EOSINOPHILS: 0 % (ref 0–7)
HCT: 43.1 % (ref 35.0–47.0)
HCT: 38.9 % (ref 35.0–47.0)
HGB: 13.3 g/dL (ref 11.5–16.0)
HGB: 12.1 g/dL (ref 11.5–16.0)
IMMATURE GRANULOCYTES: 0 % (ref 0.0–0.5)
IMMATURE GRANULOCYTES: 0 % (ref 0.0–0.5)
LYMPHOCYTES: 25 % (ref 12–49)
LYMPHOCYTES: 19 % (ref 12–49)
MCH: 28.6 PG (ref 26.0–34.0)
MCH: 29.1 PG (ref 26.0–34.0)
MCHC: 30.9 g/dL (ref 30.0–36.5)
MCHC: 31.1 g/dL (ref 30.0–36.5)
MCV: 92.7 FL (ref 80.0–99.0)
MCV: 93.5 FL (ref 80.0–99.0)
MONOCYTES: 14 % — ABNORMAL HIGH (ref 5–13)
MONOCYTES: 5 % (ref 5–13)
MPV: 12.4 FL (ref 8.9–12.9)
NEUTROPHILS: 60 % (ref 32–75)
NEUTROPHILS: 76 % — ABNORMAL HIGH (ref 32–75)
NRBC: 0 PER 100 WBC
NRBC: 0 PER 100 WBC
PLATELET: 124 10*3/uL — ABNORMAL LOW (ref 150–400)
PLATELET: 103 10*3/uL — ABNORMAL LOW (ref 150–400)
RBC: 4.65 M/uL (ref 3.80–5.20)
RBC: 4.16 M/uL (ref 3.80–5.20)
RDW: 14.3 % (ref 11.5–14.5)
RDW: 14.1 % (ref 11.5–14.5)
WBC: 3.6 10*3/uL (ref 3.6–11.0)
WBC: 2.7 10*3/uL — ABNORMAL LOW (ref 3.6–11.0)

## 2019-12-09 LAB — TROPONIN I
Troponin-I, Qt.: <0.05 ng/mL (ref <0.05)
Troponin-I, Qt.: <0.05 ng/mL (ref <0.05)

## 2019-12-09 LAB — URINALYSIS W/ RFLX MICROSCOPIC
Bilirubin, Urine: NEGATIVE
Bilirubin: NEGATIVE
Glucose, Ur: NEGATIVE mg/dL
Glucose: NEGATIVE mg/dL
Ketone: 15 mg/dL — AB
Ketones, Urine: 15 mg/dL — AB
Nitrite, Urine: NEGATIVE
Nitrites: NEGATIVE
Protein, UA: 30 mg/dL — AB
Protein: 30 mg/dL — AB
Specific Gravity, UA: 1.026 (ref 1.003–1.030)
Specific gravity: 1.026 (ref 1.003–1.030)
Urobilinogen, UA, POCT: 1 EU/dL (ref 0.2–1.0)
Urobilinogen: 1 EU/dL (ref 0.2–1.0)
pH (UA): 5.5 (ref 5.0–8.0)
pH, UA: 5.5 (ref 5.0–8.0)

## 2019-12-09 LAB — GLUCOSE, POC
Glucose (POC): 172 mg/dL — ABNORMAL HIGH (ref 65–117)
Glucose (POC): 207 mg/dL — ABNORMAL HIGH (ref 65–117)
Glucose (POC): 208 mg/dL — ABNORMAL HIGH (ref 65–117)

## 2019-12-09 LAB — FIBRINOGEN
Fibrinogen: 336 mg/dL (ref 200–475)
Fibrinogen: 336 mg/dL (ref 200–475)

## 2019-12-09 LAB — POC G3 - PUL
Base Excess, Arterial: 0.1 mmol/L
Base excess (POC): 0.1 mmol/L
HCO3 (POC): 25.5 MMOL/L (ref 22–26)
HCO3, Art: 25.5 MMOL/L (ref 22–26)
POC O2 SAT: 88.7 % — ABNORMAL LOW (ref 92–97)
pCO2 (POC): 43.1 MMHG (ref 35.0–45.0)
pCO2, Art: 43.1 MMHG (ref 35.0–45.0)
pH (POC): 7.38 (ref 7.35–7.45)
pH, Art: 7.38 (ref 7.35–7.45)
pO2 (POC): 57 MMHG — ABNORMAL LOW (ref 80–100)
pO2, Art: 57 MMHG — ABNORMAL LOW (ref 80–100)
sO2 (POC): 88.7 % — ABNORMAL LOW (ref 92–97)

## 2019-12-09 LAB — EKG, 12 LEAD, INITIAL
Atrial Rate: 84 {beats}/min
Calculated P Axis: 71 degrees
Calculated R Axis: 121 degrees
Calculated T Axis: 38 degrees
Diagnosis: NORMAL
P-R Interval: 170 ms
Q-T Interval: 394 ms
QRS Duration: 80 ms
QTC Calculation (Bezet): 465 ms
Ventricular Rate: 84 {beats}/min

## 2019-12-09 LAB — C REACTIVE PROTEIN, QT
C-Reactive protein: 2.92 mg/dL — ABNORMAL HIGH (ref 0.00–0.60)
C-Reactive protein: 2.93 mg/dL — ABNORMAL HIGH (ref 0.00–0.60)

## 2019-12-09 LAB — LACTIC ACID
Lactic Acid: 1.2 MMOL/L (ref 0.4–2.0)
Lactic Acid: 0.9 MMOL/L (ref 0.4–2.0)
Lactic acid: 1.2 MMOL/L (ref 0.4–2.0)
Lactic acid: 0.9 MMOL/L (ref 0.4–2.0)

## 2019-12-09 LAB — SARS-COV-2

## 2019-12-09 LAB — PROCALCITONIN
Procalcitonin: <0.05 ng/mL
Procalcitonin: <0.05 ng/mL

## 2019-12-09 LAB — NT-PRO BNP: NT pro-BNP: 640 PG/ML — ABNORMAL HIGH (ref <450)

## 2019-12-09 LAB — D DIMER
D-dimer: 1.64 mg/L FEU — ABNORMAL HIGH (ref 0.00–0.65)
D-dimer: 1.97 mg/L FEU — ABNORMAL HIGH (ref 0.00–0.65)

## 2019-12-09 LAB — MAGNESIUM
Magnesium: 1.9 mg/dL (ref 1.6–2.4)
Magnesium: 1.9 mg/dL (ref 1.6–2.4)
Magnesium: 1.7 mg/dL (ref 1.6–2.4)
Magnesium: 1.7 mg/dL (ref 1.6–2.4)

## 2019-12-09 LAB — HEMOGLOBIN A1C WITH EAG
Est. average glucose: 146 mg/dL
Hemoglobin A1c: 6.7 % — ABNORMAL HIGH (ref 4.0–5.6)

## 2019-12-09 LAB — FERRITIN
Ferritin: 122 NG/ML (ref 8–252)
Ferritin: 122 NG/ML (ref 8–252)

## 2019-12-09 LAB — TSH 3RD GENERATION
TSH: 0.27 u[IU]/mL — ABNORMAL LOW (ref 0.36–3.74)
TSH: 0.27 u[IU]/mL — ABNORMAL LOW (ref 0.36–3.74)

## 2019-12-09 LAB — LD: LD: 299 U/L — ABNORMAL HIGH (ref 81–246)

## 2019-12-09 LAB — PHOSPHORUS
Phosphorus: 2.9 MG/DL (ref 2.6–4.7)
Phosphorus: 2.9 MG/DL (ref 2.6–4.7)

## 2019-12-09 LAB — SAMPLES BEING HELD

## 2019-12-09 LAB — CBC WITH AUTO DIFFERENTIAL
Basophils %: 0 % (ref 0–1)
Basophils %: 0 % (ref 0–1)
Basophils Absolute: 0 10*3/uL (ref 0.0–0.1)
Basophils Absolute: 0 10*3/uL (ref 0.0–0.1)
Eosinophils %: 1 % (ref 0–7)
Eosinophils %: 0 % (ref 0–7)
Eosinophils Absolute: 0 10*3/uL (ref 0.0–0.4)
Eosinophils Absolute: 0 10*3/uL (ref 0.0–0.4)
Granulocyte Absolute Count: 0 10*3/uL (ref 0.00–0.04)
Granulocyte Absolute Count: 0 10*3/uL (ref 0.00–0.04)
Hematocrit: 43.1 % (ref 35.0–47.0)
Hematocrit: 38.9 % (ref 35.0–47.0)
Hemoglobin: 13.3 g/dL (ref 11.5–16.0)
Hemoglobin: 12.1 g/dL (ref 11.5–16.0)
Immature Granulocytes: 0 % (ref 0.0–0.5)
Immature Granulocytes: 0 % (ref 0.0–0.5)
Lymphocytes %: 25 % (ref 12–49)
Lymphocytes %: 19 % (ref 12–49)
Lymphocytes Absolute: 0.9 10*3/uL (ref 0.8–3.5)
Lymphocytes Absolute: 0.5 10*3/uL — ABNORMAL LOW (ref 0.8–3.5)
MCH: 28.6 PG (ref 26.0–34.0)
MCH: 29.1 PG (ref 26.0–34.0)
MCHC: 30.9 g/dL (ref 30.0–36.5)
MCHC: 31.1 g/dL (ref 30.0–36.5)
MCV: 92.7 FL (ref 80.0–99.0)
MCV: 93.5 FL (ref 80.0–99.0)
MPV: 12.4 FL (ref 8.9–12.9)
Monocytes %: 14 % — ABNORMAL HIGH (ref 5–13)
Monocytes %: 5 % (ref 5–13)
Monocytes Absolute: 0.5 10*3/uL (ref 0.0–1.0)
Monocytes Absolute: 0.1 10*3/uL (ref 0.0–1.0)
NRBC Absolute: 0 10*3/uL (ref 0.00–0.01)
NRBC Absolute: 0 10*3/uL (ref 0.00–0.01)
Neutrophils %: 60 % (ref 32–75)
Neutrophils %: 76 % — ABNORMAL HIGH (ref 32–75)
Neutrophils Absolute: 2.2 10*3/uL (ref 1.8–8.0)
Neutrophils Absolute: 2.1 10*3/uL (ref 1.8–8.0)
Nucleated RBCs: 0 PER 100 WBC
Nucleated RBCs: 0 PER 100 WBC
Platelets: 124 10*3/uL — ABNORMAL LOW (ref 150–400)
Platelets: 103 10*3/uL — ABNORMAL LOW (ref 150–400)
RBC: 4.65 M/uL (ref 3.80–5.20)
RBC: 4.16 M/uL (ref 3.80–5.20)
RDW: 14.3 % (ref 11.5–14.5)
RDW: 14.1 % (ref 11.5–14.5)
WBC: 3.6 10*3/uL (ref 3.6–11.0)
WBC: 2.7 10*3/uL — ABNORMAL LOW (ref 3.6–11.0)

## 2019-12-09 LAB — EKG 12-LEAD
Atrial Rate: 84 {beats}/min
Diagnosis: NORMAL
P Axis: 71 degrees
P-R Interval: 170 ms
Q-T Interval: 394 ms
QRS Duration: 80 ms
QTc Calculation (Bazett): 465 ms
R Axis: 121 degrees
T Axis: 38 degrees
Ventricular Rate: 84 {beats}/min

## 2019-12-09 LAB — COMPREHENSIVE METABOLIC PANEL
ALT: 15 U/L (ref 12–78)
AST: 20 U/L (ref 15–37)
Albumin/Globulin Ratio: 0.7 — ABNORMAL LOW (ref 1.1–2.2)
Albumin: 2.8 g/dL — ABNORMAL LOW (ref 3.5–5.0)
Alkaline Phosphatase: 43 U/L — ABNORMAL LOW (ref 45–117)
Anion Gap: 9 mmol/L (ref 5–15)
BUN: 16 MG/DL (ref 6–20)
Bun/Cre Ratio: 28 — ABNORMAL HIGH (ref 12–20)
CO2: 22 mmol/L (ref 21–32)
Calcium: 8.1 MG/DL — ABNORMAL LOW (ref 8.5–10.1)
Chloride: 108 mmol/L (ref 97–108)
Creatinine: 0.57 MG/DL (ref 0.55–1.02)
EGFR IF NonAfrican American: >60 mL/min/{1.73_m2} (ref >60)
GFR African American: >60 mL/min/{1.73_m2} (ref >60)
Globulin: 3.9 g/dL (ref 2.0–4.0)
Glucose: 149 mg/dL — ABNORMAL HIGH (ref 65–100)
Potassium: 3.8 mmol/L (ref 3.5–5.1)
Sodium: 139 mmol/L (ref 136–145)
Total Bilirubin: 0.4 MG/DL (ref 0.2–1.0)
Total Protein: 6.7 g/dL (ref 6.4–8.2)

## 2019-12-09 LAB — TROPONIN
Troponin I: <0.05 ng/mL (ref <0.05)
Troponin I: <0.05 ng/mL (ref <0.05)

## 2019-12-09 LAB — LACTATE DEHYDROGENASE: LD: 299 U/L — ABNORMAL HIGH (ref 81–246)

## 2019-12-09 LAB — POCT GLUCOSE
POC Glucose: 172 mg/dL — ABNORMAL HIGH (ref 65–117)
POC Glucose: 207 mg/dL — ABNORMAL HIGH (ref 65–117)
POC Glucose: 208 mg/dL — ABNORMAL HIGH (ref 65–117)

## 2019-12-09 LAB — HEMOGLOBIN A1C W/EAG
Hemoglobin A1C: 6.7 % — ABNORMAL HIGH (ref 4.0–5.6)
eAG: 146 mg/dL

## 2019-12-09 LAB — C-REACTIVE PROTEIN
CRP: 2.92 mg/dL — ABNORMAL HIGH (ref 0.00–0.60)
CRP: 2.93 mg/dL — ABNORMAL HIGH (ref 0.00–0.60)

## 2019-12-09 LAB — D-DIMER, QUANTITATIVE
D-Dimer, Quant: 1.64 mg/L FEU — ABNORMAL HIGH (ref 0.00–0.65)
D-Dimer, Quant: 1.97 mg/L FEU — ABNORMAL HIGH (ref 0.00–0.65)

## 2019-12-09 LAB — COVID-19

## 2019-12-09 LAB — PROBNP, N-TERMINAL: BNP: 640 PG/ML — ABNORMAL HIGH (ref <450)

## 2019-12-09 MED ORDER — ENOXAPARIN 40 MG/0.4 ML SUB-Q SYRINGE
40 mg/0.4 mL | Freq: Two times a day (BID) | SUBCUTANEOUS | Status: DC
Start: 2019-12-09 — End: 2019-12-15
  Administered 2019-12-09 – 2019-12-15 (×13): via SUBCUTANEOUS

## 2019-12-09 MED ORDER — INSULIN LISPRO 100 UNIT/ML INJECTION
100 unit/mL | Freq: Four times a day (QID) | SUBCUTANEOUS | Status: DC
Start: 2019-12-09 — End: 2019-12-15
  Administered 2019-12-09 – 2019-12-15 (×23): via SUBCUTANEOUS

## 2019-12-09 MED ORDER — SODIUM CHLORIDE 0.9 % IJ SYRG
Freq: Three times a day (TID) | INTRAMUSCULAR | Status: DC
Start: 2019-12-09 — End: 2019-12-15
  Administered 2019-12-09 – 2019-12-15 (×19): via INTRAVENOUS

## 2019-12-09 MED ORDER — ONDANSETRON (PF) 4 MG/2 ML INJECTION
4 mg/2 mL | Freq: Four times a day (QID) | INTRAMUSCULAR | Status: DC | PRN
Start: 2019-12-09 — End: 2019-12-15

## 2019-12-09 MED ORDER — METOPROLOL TARTRATE 50 MG TAB
50 mg | Freq: Two times a day (BID) | ORAL | Status: DC
Start: 2019-12-09 — End: 2019-12-15
  Administered 2019-12-09 – 2019-12-15 (×13): via ORAL

## 2019-12-09 MED ORDER — METHIMAZOLE 5 MG TAB
5 mg | Freq: Every day | ORAL | Status: DC
Start: 2019-12-09 — End: 2019-12-15
  Administered 2019-12-09 – 2019-12-15 (×7): via ORAL

## 2019-12-09 MED ORDER — GLUCOSE 4 GRAM CHEWABLE TAB
4 gram | ORAL | Status: DC | PRN
Start: 2019-12-09 — End: 2019-12-15

## 2019-12-09 MED ORDER — DEXTROSE 50% IN WATER (D50W) IV SYRG
INTRAVENOUS | Status: DC | PRN
Start: 2019-12-09 — End: 2019-12-15

## 2019-12-09 MED ORDER — CEFTRIAXONE 1 GRAM SOLUTION FOR INJECTION
1 gram | INTRAMUSCULAR | Status: DC
Start: 2019-12-09 — End: 2019-12-10
  Administered 2019-12-09 – 2019-12-10 (×2): via INTRAVENOUS

## 2019-12-09 MED ORDER — IOPAMIDOL 76 % IV SOLN
76 % | Freq: Once | INTRAVENOUS | Status: AC
Start: 2019-12-09 — End: 2019-12-09
  Administered 2019-12-09: 12:00:00 via INTRAVENOUS

## 2019-12-09 MED ORDER — DEXTROMETHORPHAN-GUAIFENESIN 10 MG-100 MG/5 ML SYRUP
100-10 mg/5 mL | ORAL | Status: DC | PRN
Start: 2019-12-09 — End: 2019-12-15

## 2019-12-09 MED ORDER — TRIAMTERENE-HYDROCHLOROTHIAZIDE 37.5 MG-25 MG TAB
Freq: Every day | ORAL | Status: DC
Start: 2019-12-09 — End: 2019-12-15
  Administered 2019-12-09 – 2019-12-15 (×7): via ORAL

## 2019-12-09 MED ORDER — FUROSEMIDE 10 MG/ML IJ SOLN
10 mg/mL | Freq: Two times a day (BID) | INTRAMUSCULAR | Status: DC
Start: 2019-12-09 — End: 2019-12-13
  Administered 2019-12-09 – 2019-12-13 (×9): via INTRAVENOUS

## 2019-12-09 MED ORDER — GLUCAGON 1 MG INJECTION
1 mg | INTRAMUSCULAR | Status: DC | PRN
Start: 2019-12-09 — End: 2019-12-15

## 2019-12-09 MED ORDER — DEXAMETHASONE 4 MG TAB
4 mg | Freq: Every day | ORAL | Status: DC
Start: 2019-12-09 — End: 2019-12-15
  Administered 2019-12-09 – 2019-12-15 (×7): via ORAL

## 2019-12-09 MED ORDER — SODIUM CHLORIDE 0.9% BOLUS IV
0.9 % | INTRAVENOUS | Status: AC
Start: 2019-12-09 — End: 2019-12-09
  Administered 2019-12-09: 02:00:00 via INTRAVENOUS

## 2019-12-09 MED ORDER — ASPIRIN 81 MG TAB, DELAYED RELEASE
81 mg | Freq: Every day | ORAL | Status: DC
Start: 2019-12-09 — End: 2019-12-15
  Administered 2019-12-09 – 2019-12-15 (×7): via ORAL

## 2019-12-09 MED ORDER — L.ACIDOPHILUS-L.PARACASEI-B.BIFIDUM-S.THERMOPHL 8 BILLION CELL CAPSULE
8 billion cell | Freq: Every day | ORAL | Status: DC
Start: 2019-12-09 — End: 2019-12-15
  Administered 2019-12-09 – 2019-12-15 (×7): via ORAL

## 2019-12-09 MED ORDER — ACETAMINOPHEN 650 MG RECTAL SUPPOSITORY
650 mg | Freq: Four times a day (QID) | RECTAL | Status: DC | PRN
Start: 2019-12-09 — End: 2019-12-15

## 2019-12-09 MED ORDER — LISINOPRIL 10 MG TAB
10 mg | Freq: Every day | ORAL | Status: DC
Start: 2019-12-09 — End: 2019-12-15
  Administered 2019-12-09 – 2019-12-15 (×7): via ORAL

## 2019-12-09 MED ORDER — ACETAMINOPHEN 325 MG TABLET
325 mg | Freq: Four times a day (QID) | ORAL | Status: DC | PRN
Start: 2019-12-09 — End: 2019-12-15

## 2019-12-09 MED ORDER — ONDANSETRON 4 MG TAB, RAPID DISSOLVE
4 mg | Freq: Three times a day (TID) | ORAL | Status: DC | PRN
Start: 2019-12-09 — End: 2019-12-15

## 2019-12-09 MED ORDER — OXYCODONE 5 MG TAB
5 mg | ORAL | Status: DC | PRN
Start: 2019-12-09 — End: 2019-12-15
  Administered 2019-12-09 – 2019-12-14 (×8): via ORAL

## 2019-12-09 MED ORDER — DEXAMETHASONE SODIUM PHOSPHATE (PF) 10 MG/ML INJECTION
10 mg/mL | Freq: Once | INTRAMUSCULAR | Status: AC
Start: 2019-12-09 — End: 2019-12-08
  Administered 2019-12-09: 04:00:00 via INTRAVENOUS

## 2019-12-09 MED ORDER — ENOXAPARIN 30 MG/0.3 ML SUB-Q SYRINGE
30 mg/0.3 mL | Freq: Two times a day (BID) | SUBCUTANEOUS | Status: DC
Start: 2019-12-09 — End: 2019-12-09
  Administered 2019-12-09: 10:00:00 via SUBCUTANEOUS

## 2019-12-09 MED ORDER — ACETAMINOPHEN 325 MG TABLET
325 mg | Freq: Four times a day (QID) | ORAL | Status: DC | PRN
Start: 2019-12-09 — End: 2019-12-09

## 2019-12-09 MED ORDER — DOXYCYCLINE HYCLATE 100 MG IV SOLUTION
100 mg | Freq: Two times a day (BID) | INTRAVENOUS | Status: DC
Start: 2019-12-09 — End: 2019-12-10
  Administered 2019-12-09 – 2019-12-10 (×3): via INTRAVENOUS

## 2019-12-09 MED ORDER — SODIUM CHLORIDE 0.9 % IJ SYRG
INTRAMUSCULAR | Status: DC | PRN
Start: 2019-12-09 — End: 2019-12-15

## 2019-12-09 MED ORDER — POLYETHYLENE GLYCOL 3350 17 GRAM (100 %) ORAL POWDER PACKET
17 gram | Freq: Every day | ORAL | Status: DC | PRN
Start: 2019-12-09 — End: 2019-12-15

## 2019-12-09 MED ORDER — ROSUVASTATIN 10 MG TAB
10 mg | Freq: Every evening | ORAL | Status: DC
Start: 2019-12-09 — End: 2019-12-15
  Administered 2019-12-10 – 2019-12-15 (×6): via ORAL

## 2019-12-09 MED ORDER — ACETAMINOPHEN 650 MG RECTAL SUPPOSITORY
650 mg | Freq: Four times a day (QID) | RECTAL | Status: DC | PRN
Start: 2019-12-09 — End: 2019-12-09

## 2019-12-09 MED ORDER — PANTOPRAZOLE 40 MG TAB, DELAYED RELEASE
40 mg | Freq: Every day | ORAL | Status: DC
Start: 2019-12-09 — End: 2019-12-15
  Administered 2019-12-09 – 2019-12-15 (×7): via ORAL

## 2019-12-09 MED ORDER — IPRATROPIUM-ALBUTEROL 2.5 MG-0.5 MG/3 ML NEB SOLUTION
2.5 mg-0.5 mg/3 ml | RESPIRATORY_TRACT | Status: DC | PRN
Start: 2019-12-09 — End: 2019-12-12

## 2019-12-09 MED FILL — DOXY-100 100 MG INTRAVENOUS SOLUTION: 100 mg | INTRAVENOUS | Qty: 100

## 2019-12-09 MED FILL — PANTOPRAZOLE 40 MG TAB, DELAYED RELEASE: 40 mg | ORAL | Qty: 1

## 2019-12-09 MED FILL — INSULIN LISPRO 100 UNIT/ML INJECTION: 100 unit/mL | SUBCUTANEOUS | Qty: 3

## 2019-12-09 MED FILL — METOPROLOL TARTRATE 50 MG TAB: 50 mg | ORAL | Qty: 1

## 2019-12-09 MED FILL — RISAQUAD 8 BILLION CELL CAPSULE: 8 billion cell | ORAL | Qty: 1

## 2019-12-09 MED FILL — INSULIN LISPRO 100 UNIT/ML INJECTION: 100 unit/mL | SUBCUTANEOUS | Qty: 2

## 2019-12-09 MED FILL — CEFTRIAXONE 1 GRAM SOLUTION FOR INJECTION: 1 gram | INTRAMUSCULAR | Qty: 1

## 2019-12-09 MED FILL — LISINOPRIL 10 MG TAB: 10 mg | ORAL | Qty: 1

## 2019-12-09 MED FILL — MONOJECT PREFILL ADVANCED 0.9 % SODIUM CHLORIDE INJECTION SYRINGE: INTRAMUSCULAR | Qty: 10

## 2019-12-09 MED FILL — ROSUVASTATIN 10 MG TAB: 10 mg | ORAL | Qty: 2

## 2019-12-09 MED FILL — TRIAMTERENE-HYDROCHLOROTHIAZIDE 37.5 MG-25 MG TAB: ORAL | Qty: 1

## 2019-12-09 MED FILL — ASPIRIN 81 MG TAB, DELAYED RELEASE: 81 mg | ORAL | Qty: 1

## 2019-12-09 MED FILL — DEXAMETHASONE 4 MG TAB: 4 mg | ORAL | Qty: 2

## 2019-12-09 MED FILL — OXYCODONE 5 MG TAB: 5 mg | ORAL | Qty: 1

## 2019-12-09 MED FILL — METHIMAZOLE 5 MG TAB: 5 mg | ORAL | Qty: 1

## 2019-12-09 MED FILL — MONOJECT PREFILL ADVANCED 0.9 % SODIUM CHLORIDE INJECTION SYRINGE: INTRAMUSCULAR | Qty: 40

## 2019-12-09 MED FILL — SODIUM CHLORIDE 0.9 % IV: INTRAVENOUS | Qty: 1000

## 2019-12-09 MED FILL — ENOXAPARIN 40 MG/0.4 ML SUB-Q SYRINGE: 40 mg/0.4 mL | SUBCUTANEOUS | Qty: 0.4

## 2019-12-09 MED FILL — ISOVUE-370  76 % INTRAVENOUS SOLUTION: 370 mg iodine /mL (76 %) | INTRAVENOUS | Qty: 100

## 2019-12-09 MED FILL — DEXAMETHASONE SODIUM PHOSPHATE (PF) 10 MG/ML INJECTION: 10 mg/mL | INTRAMUSCULAR | Qty: 1

## 2019-12-09 MED FILL — FUROSEMIDE 10 MG/ML IJ SOLN: 10 mg/mL | INTRAMUSCULAR | Qty: 4

## 2019-12-09 NOTE — Progress Notes (Signed)
Progress Notes by Purvis Sheffield, MD at 12-27-19 1238                Author: Purvis Sheffield, MD  Service: Internal Medicine  Author Type: Physician       Filed: 12-27-2019 1804  Date of Service: 27-Dec-2019 1238  Status: Signed          Editor: Purvis Sheffield, MD (Physician)                                                                                    Hospitalist Progress Note   Purvis Sheffield, MD   Answering service: [phone] OR [phone] from in house phone         Date of Service:  2019/12/27   NAME:  Allison Newman   DOB:  Feb 15, 1941   MRN:  [id number]        Admission Summary:        44F p/w sob, obese hx of COPD      Interval history / Subjective:        Patient seen and examined at bedside, feels little improved. Hx revisited, few days, feeling sob and cough. No chest pain.          Assessment & Plan:          #. COVID virus PNA. Based on Rapid test outside facility. Repeat PCR pending.   #. COPD exacerbation: 2nd to above.   #. Acute hypoxic resp failure: 2nd to above   - elevated D-dimer related to above, CTA: no PE, b/l infiltrates   - Steroids, empiric abx, Albuterol inh PRN. Zinc, ascorbic acid.O2- wean to keep sats >90%.      #. DM2: A1c 6.7, SSI, AccuChecks, monitor   #. HTN: chronic, stable, Home regimen, PRN BP meds. Monitor   #. Morbid obesity: BMI 52. counseled on health benefits of weight loss, Healthy diet   #. HLD: chronic, Stable, Home regimen   #. Hyperthyroidism: home Tapazole. TSH pending.   #. UTI: UA suggestive, Urine culture pending. Empiric iv abx.      Code status: Full   DVT prophylaxis: lovenox   Care Plan discussed with: Patient/Family and Nurse   Disposition: TBD 1-2days           Hospital Problems   Date Reviewed:  12/27/19                     Codes  Class  Noted  POA              * (Principal) Acute respiratory failure with hypoxia [facility])  ICD-10-CM: J96.01   ICD-9-CM: 518.81    12/08/2019  Yes                         Review of Systems:     Pertinent items are mentioned in  interval history.        Vital Signs:      Last 24hrs VS reviewed since prior progress note. Most recent are:   Visit Vitals      BP  (!) 166/85     Pulse  76  Temp  97.5 ??F (36.4 ??C)     Resp  12     Ht  5\' 4"  (1.626 m)     Wt  136.4 kg (300 lb 11.3 oz)     SpO2  93%        BMI  51.62 kg/m??              Intake/Output Summary (Last 24 hours) at 12/09/2019 1238   Last data filed at 12/09/2019 1200     Gross per 24 hour        Intake  1000 ml        Output  2200 ml        Net  -1200 ml              Physical Examination:     Evaluated face to face and examined 12/09/19      General:  Alert, oriented, No acute distress. Morbidly obese.   Resp:  No accessory muscle use, Good AE, no wheezes. no crepitations   Abd:  Soft, non-tender, non-distended, BS+   Extremities:  No cyanosis or clubbing, no significant edema   Neuro:  Grossly normal, no focal neuro deficits, follows commands, speech wnl   Psych:  Good insight, not agitated.        Data Review:      Review and/or order of clinical lab test   Review and/or order of tests in the radiology section of CPT   Review and/or order of tests in the medicine section of CPT     Labs:          Recent Labs            12/09/19   0545  12/08/19   2106     WBC  2.7*  3.6     HGB  12.1  13.3     HCT  38.9  43.1         PLT  103*  124*          Recent Labs            12/09/19   0545  12/08/19   1917     NA  139  137     K  3.8  5.1     CL  108  107     CO2  22  25     BUN  16  20     CREA  0.57  0.81     GLU  149*  158*     CA  8.1*  8.6     MG  1.7  1.9         PHOS  2.9   --           Recent Labs            12/09/19   0545  12/08/19   1917     ALT  15  17     AP  43*  50     TBILI  0.4  0.6     TP  6.7  7.9     ALB  2.8*  3.0*         GLOB  3.9  4.9*        No results for input(s): INR, PTP, APTT, INREXT in the last 72 hours.      Recent Labs           12/09/19   0259  FERR  122         No results found for: FOL, RBCF    No results for input(s): PH, PCO2, PO2 in the last 72  hours.     Recent Labs            12/09/19   0545  12/09/19   0259         TROIQ  <0.05  <0.05          Lab Results         Component  Value  Date/Time            Cholesterol, total  116  06/08/2017 04:00 AM       HDL Cholesterol  50  06/08/2017 04:00 AM       LDL, calculated  46.2  06/08/2017 04:00 AM       Triglyceride  99  06/08/2017 04:00 AM            CHOL/HDL Ratio  2.3  06/08/2017 04:00 AM          Lab Results         Component  Value  Date/Time            Glucose (POC)  207 (H)  12/09/2019 11:35 AM       Glucose (POC)  172 (H)  12/09/2019 09:24 AM       Glucose (POC)  187 (H)  06/11/2017 11:39 AM       Glucose (POC)  159 (H)  06/11/2017 07:03 AM       Glucose (POC)  163 (H)  06/10/2017 08:39 PM       Glucose POC  237  01/15/2018 10:37 AM            Glucose POC  174  10/12/2017 11:24 AM          Lab Results         Component  Value  Date/Time            Color  YELLOW/STRAW  12/09/2019 02:58 AM       Appearance  CLEAR  12/09/2019 02:58 AM       Specific gravity  1.026  12/09/2019 02:58 AM       pH (UA)  5.5  12/09/2019 02:58 AM       Protein  30 (A)  12/09/2019 02:58 AM       Glucose  Negative  12/09/2019 02:58 AM       Ketone  15 (A)  12/09/2019 02:58 AM       Bilirubin  Negative  12/09/2019 02:58 AM       Urobilinogen  1.0  12/09/2019 02:58 AM       Nitrites  Negative  12/09/2019 02:58 AM       Leukocyte Esterase  TRACE (A)  12/09/2019 02:58 AM       Epithelial cells  FEW  12/09/2019 02:58 AM       Bacteria  1+ (A)  12/09/2019 02:58 AM       WBC  5-10  12/09/2019 02:58 AM            RBC  0-5  12/09/2019 02:58 AM          Medications Reviewed:          Current Facility-Administered Medications          Medication  Dose  Route  Frequency           ?  aspirin delayed-release tablet 81 mg   81 mg  Oral  DAILY     ?  lisinopriL (PRINIVIL, ZESTRIL) tablet 10 mg   10 mg  Oral  DAILY     ?  methIMAzole (TAPAZOLE) tablet 5 mg   5 mg  Oral  DAILY     ?  metoprolol tartrate (LOPRESSOR) tablet 50 mg   50 mg  Oral  BID      ?  oxyCODONE IR (ROXICODONE) tablet 5 mg   5 mg  Oral  Q4H PRN     ?  pantoprazole (PROTONIX) tablet 40 mg   40 mg  Oral  DAILY     ?  rosuvastatin (CRESTOR) tablet 20 mg   20 mg  Oral  QHS     ?  triamterene-hydroCHLOROthiazide (MAXZIDE) 37.5-25 mg per tablet 1 Tablet   1 Tablet  Oral  DAILY     ?  sodium chloride (NS) flush 5-40 mL   5-40 mL  IntraVENous  Q8H     ?  sodium chloride (NS) flush 5-40 mL   5-40 mL  IntraVENous  PRN     ?  acetaminophen (TYLENOL) tablet 650 mg   650 mg  Oral  Q6H PRN          Or           ?  acetaminophen (TYLENOL) suppository 650 mg   650 mg  Rectal  Q6H PRN     ?  polyethylene glycol (MIRALAX) packet 17 g   17 g  Oral  DAILY PRN     ?  ondansetron (ZOFRAN ODT) tablet 4 mg   4 mg  Oral  Q8H PRN          Or           ?  ondansetron (ZOFRAN) injection 4 mg   4 mg  IntraVENous  Q6H PRN     ?  doxycycline (VIBRAMYCIN) 100 mg in 0.9% sodium chloride (MBP/ADV) 100 mL MBP   100 mg  IntraVENous  Q12H     ?  insulin lispro (HUMALOG) injection     SubCUTAneous  AC&HS     ?  glucose chewable tablet 16 g   4 Tablet  Oral  PRN     ?  dextrose (D50W) injection syrg 12.5-25 g   12.5-25 g  IntraVENous  PRN     ?  glucagon (GLUCAGEN) injection 1 mg   1 mg  IntraMUSCular  PRN     ?  albuterol-ipratropium (DUO-NEB) 2.5 MG-0.5 MG/3 ML   3 mL  Nebulization  Q4H PRN     ?  guaiFENesin-dextromethorphan (ROBITUSSIN DM) 100-10 mg/5 mL syrup 5 mL   5 mL  Oral  Q4H PRN     ?  dexAMETHasone (DECADRON) tablet 6 mg   6 mg  Oral  DAILY     ?  cefTRIAXone (ROCEPHIN) 1 g in sterile water (preservative free) 10 mL IV syringe   1 g  IntraVENous  Q24H     ?  L.acidophilus-paracasei-S.thermophil-bifidobacter (RISAQUAD) 8 billion cell capsule   1 Capsule  Oral  DAILY     ?  enoxaparin (LOVENOX) injection 40 mg   40 mg  SubCUTAneous  Q12H     ?  furosemide (LASIX) injection 40 mg   40 mg  IntraVENous  Q12H           ?  sodium chloride (NS) flush 5-10 mL   5-10 mL  IntraVENous  PRN          Current Outpatient Medications         Medication  Sig         ?  oxyCODONE (OXYIR) 5 mg capsule  Take 5 mg by mouth every four (4) hours as needed for Pain.     ?  metoprolol tartrate (LOPRESSOR) 50 mg tablet  TAKE 1 TABLET TWICE DAILY     ?  rosuvastatin (CRESTOR) 20 mg tablet  TAKE 1 TABLET EVERY NIGHT     ?  metFORMIN (GLUCOPHAGE) 1,000 mg tablet  TAKE 1 TABLET TWICE DAILY WITH MEALS (Patient taking differently: two (2) times daily (with meals).)     ?  Breo Ellipta 200-25 mcg/dose inhaler  INHALE 1 PUFF BY MOUTH DAILY     ?  methIMAzole (TAPAZOLE) 5 mg tablet  TAKE 1 TABLET EVERY DAY     ?  triamterene-hydroCHLOROthiazide (DYAZIDE) 37.5-25 mg per capsule  TAKE 1 CAPSULE EVERY DAY     ?  lisinopril (PRINIVIL, ZESTRIL) 10 mg tablet  TAKE 1 TABLET EVERY DAY     ?  CALCIUM 600 + D tablet  take 1 tablet by mouth twice a day     ?  aspirin delayed-release 81 mg tablet  Take 1 Tab by mouth daily.     ?  Eliquis 5 mg tablet  TAKE 1 TABLET BY MOUTH TWICE DAILY     ?  pantoprazole (PROTONIX) 40 mg tablet  Take 1 Tab by mouth daily.     ?  nystatin (NYSTOP) powder  Apply to affected area TID PRN     ?  glucose blood VI test strips (BLOOD GLUCOSE TEST) strip  Use BID Dx11.9 TRUEMETRIX     ?  lancets misc  Use BID. Dx: E11.9         ?  FREESTYLE LANCETS 28 gauge misc       ______________________________________________________________________   EXPECTED LENGTH OF STAY: - - -   ACTUAL LENGTH OF STAY:          1                 Nneka Blanda, MD

## 2019-12-09 NOTE — H&P (Signed)
H&P dictated [id number]

## 2019-12-09 NOTE — ED Notes (Signed)
 TRANSFER - OUT REPORT:    Verbal report given to Koda on Allison Newman  being transferred to [facility]  for routine progression of care       Report consisted of patient's Situation, Background, Assessment and   Recommendations(SBAR).     Information from the following report(s) SBAR and ED Summary was reviewed with the receiving nurse.    Lines:   Peripheral IV 12/09/19 Left Forearm (Active)   Site Assessment Clean, dry, & intact 12/09/19 1558   Phlebitis Assessment 0 12/09/19 1558   Infiltration Assessment 0 12/09/19 1558   Dressing Status Clean, dry, & intact 12/09/19 1558   Dressing Type Transparent 12/09/19 1558   Hub Color/Line Status Blue 12/09/19 1558   Action Taken Open ports on tubing capped 12/09/19 1558   Alcohol Cap Used Yes 12/09/19 1558        Opportunity for questions and clarification was provided.      Patient transported with:   Monitor

## 2019-12-10 LAB — GLUCOSE, POC
Glucose (POC): 182 mg/dL — ABNORMAL HIGH (ref 65–117)
Glucose (POC): 183 mg/dL — ABNORMAL HIGH (ref 65–117)
Glucose (POC): 212 mg/dL — ABNORMAL HIGH (ref 65–117)
Glucose (POC): 213 mg/dL — ABNORMAL HIGH (ref 65–117)

## 2019-12-10 LAB — C REACTIVE PROTEIN, QT: C-Reactive protein: 3.68 mg/dL — ABNORMAL HIGH (ref 0.00–0.60)

## 2019-12-10 LAB — D DIMER: D-dimer: 2.48 mg/L FEU — ABNORMAL HIGH (ref 0.00–0.65)

## 2019-12-10 LAB — CULTURE, URINE
Colonies Counted: 30000
Colony Count: 30000

## 2019-12-10 LAB — SARS-COV-2: SARS-CoV-2: DETECTED — CR

## 2019-12-10 LAB — POCT GLUCOSE
POC Glucose: 182 mg/dL — ABNORMAL HIGH (ref 65–117)
POC Glucose: 183 mg/dL — ABNORMAL HIGH (ref 65–117)
POC Glucose: 212 mg/dL — ABNORMAL HIGH (ref 65–117)
POC Glucose: 213 mg/dL — ABNORMAL HIGH (ref 65–117)

## 2019-12-10 LAB — COVID-19: SARS-CoV-2: DETECTED — CR

## 2019-12-10 LAB — C-REACTIVE PROTEIN: CRP: 3.68 mg/dL — ABNORMAL HIGH (ref 0.00–0.60)

## 2019-12-10 LAB — D-DIMER, QUANTITATIVE: D-Dimer, Quant: 2.48 mg/L FEU — ABNORMAL HIGH (ref 0.00–0.65)

## 2019-12-10 MED ORDER — SODIUM CHLORIDE 0.9 % IV
INTRAVENOUS | Status: AC
Start: 2019-12-10 — End: 2019-12-14
  Administered 2019-12-11 – 2019-12-14 (×4): via INTRAVENOUS

## 2019-12-10 MED ORDER — REMDESIVIR 100 MG IV INJECTION
Freq: Once | INTRAVENOUS | Status: AC
Start: 2019-12-10 — End: 2019-12-10
  Administered 2019-12-10: 21:00:00 via INTRAVENOUS

## 2019-12-10 MED FILL — VEKLURY 100 MG INTRAVENOUS POWDER FOR SOLUTION: 100 mg | INTRAVENOUS | Qty: 200

## 2019-12-10 MED FILL — TRIAMTERENE-HYDROCHLOROTHIAZIDE 37.5 MG-25 MG TAB: ORAL | Qty: 1

## 2019-12-10 MED FILL — DOXY-100 100 MG INTRAVENOUS SOLUTION: 100 mg | INTRAVENOUS | Qty: 100

## 2019-12-10 MED FILL — ENOXAPARIN 40 MG/0.4 ML SUB-Q SYRINGE: 40 mg/0.4 mL | SUBCUTANEOUS | Qty: 0.4

## 2019-12-10 MED FILL — CEFTRIAXONE 1 GRAM SOLUTION FOR INJECTION: 1 gram | INTRAMUSCULAR | Qty: 1

## 2019-12-10 MED FILL — METOPROLOL TARTRATE 50 MG TAB: 50 mg | ORAL | Qty: 1

## 2019-12-10 MED FILL — FUROSEMIDE 10 MG/ML IJ SOLN: 10 mg/mL | INTRAMUSCULAR | Qty: 4

## 2019-12-10 MED FILL — ROSUVASTATIN 10 MG TAB: 10 mg | ORAL | Qty: 2

## 2019-12-10 MED FILL — LISINOPRIL 10 MG TAB: 10 mg | ORAL | Qty: 1

## 2019-12-10 MED FILL — INSULIN LISPRO 100 UNIT/ML INJECTION: 100 unit/mL | SUBCUTANEOUS | Qty: 1

## 2019-12-10 MED FILL — PANTOPRAZOLE 40 MG TAB, DELAYED RELEASE: 40 mg | ORAL | Qty: 1

## 2019-12-10 MED FILL — OXYCODONE 5 MG TAB: 5 mg | ORAL | Qty: 1

## 2019-12-10 MED FILL — DEXAMETHASONE 1 MG TAB: 1 mg | ORAL | Qty: 2

## 2019-12-10 MED FILL — RISAQUAD 8 BILLION CELL CAPSULE: 8 billion cell | ORAL | Qty: 1

## 2019-12-10 MED FILL — ASPIRIN 81 MG TAB, DELAYED RELEASE: 81 mg | ORAL | Qty: 1

## 2019-12-10 MED FILL — METHIMAZOLE 5 MG TAB: 5 mg | ORAL | Qty: 1

## 2019-12-10 NOTE — Progress Notes (Signed)
Pt is Covid positive, unable to give vaccine.

## 2019-12-10 NOTE — Progress Notes (Signed)
Progress Notes by Purvis Sheffield, MD at 12/10/19 1339                Author: Purvis Sheffield, MD  Service: Internal Medicine  Author Type: Physician       Filed: 12/10/19 1344  Date of Service: 12/10/19 1339  Status: Signed          Editor: Purvis Sheffield, MD (Physician)                                                                                    Hospitalist Progress Note   Purvis Sheffield, MD   Answering service: [phone] OR [phone] from in house phone         Date of Service:  12/10/2019   NAME:  Allison Newman   DOB:  Nov 28, 1940   MRN:  [id number]        Admission Summary:        20F p/w sob, obese hx of COPD      Interval history / Subjective:        Patient seen and examined at bedside, feels little improved.           Assessment & Plan:          #. COVID virus PNA. Based on Rapid test outside facility. Repeat PCR +ve.   #. COPD exacerbation: 2nd to above.   #. Acute hypoxic resp failure: 2nd to above   - elevated D-dimer related to above, CTA: no PE, b/l infiltrates. Will start Remdesivir   - Steroids, empiric abx, Albuterol inh PRN. Zinc, ascorbic acid.O2- wean to keep sats >90%.      #. DM2: A1c 6.7, SSI, AccuChecks, monitor   #. HTN: chronic, stable, Home regimen, PRN BP meds. Monitor   #. Morbid obesity: BMI 52. counseled on health benefits of weight loss, Healthy diet   #. HLD: chronic, Stable, Home regimen   #. Hyperthyroidism: home Tapazole. TSH 0.27.   #. UTI: ruled out.      Code status: Full   DVT prophylaxis: lovenox   Care Plan discussed with: Patient/Family and Nurse   Disposition: TBD 1-2days           Hospital Problems   Date Reviewed:  12-17-2019                     Codes  Class  Noted  POA              * (Principal) Acute respiratory failure with hypoxia [facility])  ICD-10-CM: J96.01   ICD-9-CM: 518.81    12/08/2019  Yes                         Review of Systems:     Pertinent items are mentioned in interval history.        Vital Signs:      Last 24hrs VS reviewed since prior progress  note. Most recent are:   Visit Vitals      BP  105/67 (BP 1 Location: Right lower arm, BP Patient Position: At rest)     Pulse  69  Temp  98.6 ??F (37 ??C)     Resp  22     Ht   (1.626 m)     Wt  136.4 kg (300 lb 11.3 oz)     SpO2  93%        BMI  51.62 kg/m??              Intake/Output Summary (Last 24 hours) at 12/10/2019 1339   Last data filed at 12/10/2019 0600     Gross per 24 hour        Intake  --        Output  2750 ml        Net  -2750 ml              Physical Examination:     Evaluated face to face and examined 12/10/19      General:  Alert, oriented, No acute distress. Morbidly obese.   Resp:  No accessory muscle use, Good AE, no wheezes. no crepitations   Abd:  Soft, non-tender, non-distended, BS+   Extremities:  No cyanosis or clubbing, no significant edema   Neuro:  Grossly normal, no focal neuro deficits, follows commands, speech wnl   Psych:  Good insight, not agitated.        Data Review:      Review and/or order of clinical lab test   Review and/or order of tests in the radiology section of CPT   Review and/or order of tests in the medicine section of CPT     Labs:          Recent Labs            12/09/19   0545  12/08/19   2106     WBC  2.7*  3.6     HGB  12.1  13.3     HCT  38.9  43.1         PLT  103*  124*          Recent Labs            12/09/19   0545  12/08/19   1917     NA  139  137     K  3.8  5.1     CL  108  107     CO2  22  25     BUN  16  20     CREA  0.57  0.81     GLU  149*  158*     CA  8.1*  8.6     MG  1.7  1.9         PHOS  2.9   --           Recent Labs            12/09/19   0545  12/08/19   1917     ALT  15  17     AP  43*  50     TBILI  0.4  0.6     TP  6.7  7.9     ALB  2.8*  3.0*         GLOB  3.9  4.9*        No results for input(s): INR, PTP, APTT, INREXT, INREXT in the last 72 hours.      Recent Labs           12/09/19   0259  FERR  122         No results found for: FOL, RBCF    No results for input(s): PH, PCO2, PO2 in the last 72 hours.     Recent Labs             12/09/19   0545  12/09/19   0259         TROIQ  <0.05  <0.05          Lab Results         Component  Value  Date/Time            Cholesterol, total  116  06/08/2017 04:00 AM       HDL Cholesterol  50  06/08/2017 04:00 AM       LDL, calculated  46.2  06/08/2017 04:00 AM       Triglyceride  99  06/08/2017 04:00 AM            CHOL/HDL Ratio  2.3  06/08/2017 04:00 AM          Lab Results         Component  Value  Date/Time            Glucose (POC)  212 (H)  12/10/2019 12:24 PM       Glucose (POC)  183 (H)  12/10/2019 08:36 AM       Glucose (POC)  182 (H)  12/09/2019 11:43 PM       Glucose (POC)  208 (H)  12/09/2019 04:08 PM            Glucose (POC)  207 (H)  12/09/2019 11:35 AM          Lab Results         Component  Value  Date/Time            Color  YELLOW/STRAW  12/09/2019 02:58 AM       Appearance  CLEAR  12/09/2019 02:58 AM       Specific gravity  1.026  12/09/2019 02:58 AM       pH (UA)  5.5  12/09/2019 02:58 AM       Protein  30 (A)  12/09/2019 02:58 AM       Glucose  Negative  12/09/2019 02:58 AM       Ketone  15 (A)  12/09/2019 02:58 AM       Bilirubin  Negative  12/09/2019 02:58 AM       Urobilinogen  1.0  12/09/2019 02:58 AM       Nitrites  Negative  12/09/2019 02:58 AM       Leukocyte Esterase  TRACE (A)  12/09/2019 02:58 AM       Epithelial cells  FEW  12/09/2019 02:58 AM       Bacteria  1+ (A)  12/09/2019 02:58 AM       WBC  5-10  12/09/2019 02:58 AM            RBC  0-5  12/09/2019 02:58 AM          Medications Reviewed:          Current Facility-Administered Medications          Medication  Dose  Route  Frequency           ?  aspirin delayed-release tablet 81 mg   81 mg  Oral  DAILY     ?  lisinopriL (PRINIVIL, ZESTRIL) tablet 10 mg   10  mg  Oral  DAILY     ?  methIMAzole (TAPAZOLE) tablet 5 mg   5 mg  Oral  DAILY     ?  metoprolol tartrate (LOPRESSOR) tablet 50 mg   50 mg  Oral  BID     ?  oxyCODONE IR (ROXICODONE) tablet 5 mg   5 mg  Oral  Q4H PRN     ?  pantoprazole (PROTONIX) tablet 40 mg   40 mg   Oral  DAILY     ?  rosuvastatin (CRESTOR) tablet 20 mg   20 mg  Oral  QHS     ?  triamterene-hydroCHLOROthiazide (MAXZIDE) 37.5-25 mg per tablet 1 Tablet   1 Tablet  Oral  DAILY     ?  sodium chloride (NS) flush 5-40 mL   5-40 mL  IntraVENous  Q8H     ?  sodium chloride (NS) flush 5-40 mL   5-40 mL  IntraVENous  PRN     ?  acetaminophen (TYLENOL) tablet 650 mg   650 mg  Oral  Q6H PRN          Or           ?  acetaminophen (TYLENOL) suppository 650 mg   650 mg  Rectal  Q6H PRN           ?  polyethylene glycol (MIRALAX) packet 17 g   17 g  Oral  DAILY PRN           ?  ondansetron (ZOFRAN ODT) tablet 4 mg   4 mg  Oral  Q8H PRN          Or           ?  ondansetron (ZOFRAN) injection 4 mg   4 mg  IntraVENous  Q6H PRN     ?  doxycycline (VIBRAMYCIN) 100 mg in 0.9% sodium chloride (MBP/ADV) 100 mL MBP   100 mg  IntraVENous  Q12H     ?  insulin lispro (HUMALOG) injection     SubCUTAneous  AC&HS     ?  glucose chewable tablet 16 g   4 Tablet  Oral  PRN     ?  dextrose (D50W) injection syrg 12.5-25 g   12.5-25 g  IntraVENous  PRN     ?  glucagon (GLUCAGEN) injection 1 mg   1 mg  IntraMUSCular  PRN     ?  albuterol-ipratropium (DUO-NEB) 2.5 MG-0.5 MG/3 ML   3 mL  Nebulization  Q4H PRN     ?  guaiFENesin-dextromethorphan (ROBITUSSIN DM) 100-10 mg/5 mL syrup 5 mL   5 mL  Oral  Q4H PRN     ?  dexAMETHasone (DECADRON) tablet 6 mg   6 mg  Oral  DAILY     ?  cefTRIAXone (ROCEPHIN) 1 g in sterile water (preservative free) 10 mL IV syringe   1 g  IntraVENous  Q24H     ?  L.acidophilus-paracasei-S.thermophil-bifidobacter (RISAQUAD) 8 billion cell capsule   1 Capsule  Oral  DAILY     ?  enoxaparin (LOVENOX) injection 40 mg   40 mg  SubCUTAneous  Q12H     ?  furosemide (LASIX) injection 40 mg   40 mg  IntraVENous  Q12H           ?  sodium chloride (NS) flush 5-10 mL   5-10 mL  IntraVENous  PRN     ______________________________________________________________________   EXPECTED LENGTH OF STAY: 5d  9h   ACTUAL LENGTH OF STAY:          2                  Purvis SheffieldAsad Wenceslaus Gist, MD

## 2019-12-11 LAB — METABOLIC PANEL, COMPREHENSIVE
A-G Ratio: 0.6 — ABNORMAL LOW (ref 1.1–2.2)
ALT (SGPT): 13 U/L (ref 12–78)
AST (SGOT): 19 U/L (ref 15–37)
Albumin: 2.7 g/dL — ABNORMAL LOW (ref 3.5–5.0)
Alk. phosphatase: 47 U/L (ref 45–117)
Anion gap: 8 mmol/L (ref 5–15)
BUN/Creatinine ratio: 28 — ABNORMAL HIGH (ref 12–20)
BUN: 23 MG/DL — ABNORMAL HIGH (ref 6–20)
Bilirubin, total: 0.4 MG/DL (ref 0.2–1.0)
CO2: 31 mmol/L (ref 21–32)
Calcium: 8.6 MG/DL (ref 8.5–10.1)
Chloride: 98 mmol/L (ref 97–108)
Creatinine: 0.81 MG/DL (ref 0.55–1.02)
GFR est AA: >60 mL/min/{1.73_m2} (ref >60)
GFR est non-AA: >60 mL/min/{1.73_m2} (ref >60)
Globulin: 4.6 g/dL — ABNORMAL HIGH (ref 2.0–4.0)
Glucose: 154 mg/dL — ABNORMAL HIGH (ref 65–100)
Potassium: 3.3 mmol/L — ABNORMAL LOW (ref 3.5–5.1)
Protein, total: 7.3 g/dL (ref 6.4–8.2)
Sodium: 137 mmol/L (ref 136–145)

## 2019-12-11 LAB — GLUCOSE, POC
Glucose (POC): 251 mg/dL — ABNORMAL HIGH (ref 65–117)
Glucose (POC): 170 mg/dL — ABNORMAL HIGH (ref 65–117)
Glucose (POC): 146 mg/dL — ABNORMAL HIGH (ref 65–117)
Glucose (POC): 294 mg/dL — ABNORMAL HIGH (ref 65–117)

## 2019-12-11 LAB — PROCALCITONIN
Procalcitonin: <0.05 ng/mL
Procalcitonin: <0.05 ng/mL

## 2019-12-11 LAB — POCT GLUCOSE
POC Glucose: 251 mg/dL — ABNORMAL HIGH (ref 65–117)
POC Glucose: 170 mg/dL — ABNORMAL HIGH (ref 65–117)
POC Glucose: 146 mg/dL — ABNORMAL HIGH (ref 65–117)
POC Glucose: 294 mg/dL — ABNORMAL HIGH (ref 65–117)

## 2019-12-11 LAB — COMPREHENSIVE METABOLIC PANEL
ALT: 13 U/L (ref 12–78)
AST: 19 U/L (ref 15–37)
Albumin/Globulin Ratio: 0.6 — ABNORMAL LOW (ref 1.1–2.2)
Albumin: 2.7 g/dL — ABNORMAL LOW (ref 3.5–5.0)
Alkaline Phosphatase: 47 U/L (ref 45–117)
Anion Gap: 8 mmol/L (ref 5–15)
BUN: 23 MG/DL — ABNORMAL HIGH (ref 6–20)
Bun/Cre Ratio: 28 — ABNORMAL HIGH (ref 12–20)
CO2: 31 mmol/L (ref 21–32)
Calcium: 8.6 MG/DL (ref 8.5–10.1)
Chloride: 98 mmol/L (ref 97–108)
Creatinine: 0.81 MG/DL (ref 0.55–1.02)
EGFR IF NonAfrican American: >60 mL/min/{1.73_m2} (ref >60)
GFR African American: >60 mL/min/{1.73_m2} (ref >60)
Globulin: 4.6 g/dL — ABNORMAL HIGH (ref 2.0–4.0)
Glucose: 154 mg/dL — ABNORMAL HIGH (ref 65–100)
Potassium: 3.3 mmol/L — ABNORMAL LOW (ref 3.5–5.1)
Sodium: 137 mmol/L (ref 136–145)
Total Bilirubin: 0.4 MG/DL (ref 0.2–1.0)
Total Protein: 7.3 g/dL (ref 6.4–8.2)

## 2019-12-11 MED ORDER — POTASSIUM CHLORIDE SR 10 MEQ TAB
10 mEq | ORAL | Status: AC
Start: 2019-12-11 — End: 2019-12-11
  Administered 2019-12-11: 14:00:00 via ORAL

## 2019-12-11 MED FILL — METOPROLOL TARTRATE 50 MG TAB: 50 mg | ORAL | Qty: 1

## 2019-12-11 MED FILL — VEKLURY 100 MG INTRAVENOUS POWDER FOR SOLUTION: 100 mg | INTRAVENOUS | Qty: 100

## 2019-12-11 MED FILL — ENOXAPARIN 40 MG/0.4 ML SUB-Q SYRINGE: 40 mg/0.4 mL | SUBCUTANEOUS | Qty: 0.4

## 2019-12-11 MED FILL — PANTOPRAZOLE 40 MG TAB, DELAYED RELEASE: 40 mg | ORAL | Qty: 1

## 2019-12-11 MED FILL — OXYCODONE 5 MG TAB: 5 mg | ORAL | Qty: 1

## 2019-12-11 MED FILL — RISAQUAD 8 BILLION CELL CAPSULE: 8 billion cell | ORAL | Qty: 1

## 2019-12-11 MED FILL — INSULIN LISPRO 100 UNIT/ML INJECTION: 100 unit/mL | SUBCUTANEOUS | Qty: 3

## 2019-12-11 MED FILL — DEXAMETHASONE 1 MG TAB: 1 mg | ORAL | Qty: 2

## 2019-12-11 MED FILL — INSULIN LISPRO 100 UNIT/ML INJECTION: 100 unit/mL | SUBCUTANEOUS | Qty: 1

## 2019-12-11 MED FILL — FUROSEMIDE 10 MG/ML IJ SOLN: 10 mg/mL | INTRAMUSCULAR | Qty: 4

## 2019-12-11 MED FILL — ROSUVASTATIN 10 MG TAB: 10 mg | ORAL | Qty: 2

## 2019-12-11 MED FILL — METHIMAZOLE 5 MG TAB: 5 mg | ORAL | Qty: 1

## 2019-12-11 MED FILL — TRIAMTERENE-HYDROCHLOROTHIAZIDE 37.5 MG-25 MG TAB: ORAL | Qty: 1

## 2019-12-11 MED FILL — ASPIRIN 81 MG TAB, DELAYED RELEASE: 81 mg | ORAL | Qty: 1

## 2019-12-11 MED FILL — K-TAB 10 MEQ TABLET,EXTENDED RELEASE: 10 mEq | ORAL | Qty: 4

## 2019-12-11 MED FILL — LISINOPRIL 10 MG TAB: 10 mg | ORAL | Qty: 1

## 2019-12-11 NOTE — Progress Notes (Signed)
Bedside shift change report given to Mo RN (oncoming nurse) by Rolly Salter RN (offgoing nurse). Report included the following information SBAR, Kardex, Intake/Output, MAR and Recent Results.

## 2019-12-11 NOTE — Progress Notes (Signed)
 Transition of Care Plan  RUR 17%    COVID 19 Positive 12/09/19  Has been fully vaccinated    Disposition Home    Transportation    Family--son [facility] health   Will arrange if ordered    Home 02   Will arrange if ordered. Currently requiring 3-5 liters 02 NC  No 02 at home   [facility] accepts patient's insurance--[employer] (contact Apolinar [phone]    Medical follow up PCP and specialist    Contact  Son   Allison Newman  [phone]    Reason for Admission:  COVID 19 PNA, COPD exacerbation , acute hypoxic respiratory failure  Hx COPD  No 02 at home, diabetes, hypertension, GERD, pulmonary embolism                   RUR Score:      17%               Plan for utilizing home health:     TBD  Will arrange if ordered     PCP: First and Last name:  Allison True PEDLAR, MD     Name of Practice:    Are you a current patient: Yes/No: yes   Approximate date of last visit: 08/21/19   Can you participate in a virtual visit with your PCP: no                    Current Advanced Directive/Advance Care Plan: Full Code      Healthcare Decision Maker:   Click here to complete HealthCare Decision Makers including selection of the Healthcare Decision Maker Relationship (ie Primary)                           Transition of Care Plan:      Home with medical follow up    Will arrange home 02 if patient is not weaned and requires it    CM called patient in her room but she did not answer the room phone nor cell.  CM then called her son Allison Newman he confirmed demographics, PCP and insurance  [employer].  Patient secures her medications from [facility] and mail order    Patient lives in a two level home with her brother, Lupita Sharps and grandson, Dominick.  Patient has a bedroom on the first floor.  She uses RW for ambulation and is self care with adl's and iadl's.  Patient drives occasionally but son or other family members transport to appointments and daily errands.  Son said patient will drive to the grocery store but he usually  transports her.     Medicare pt has received, reviewed, and signed 1st IM letter informing them of their right to appeal the discharge.  CM explained the Medicare letter to patient's son on the phone. A copy has been placed on pt bedside chart. And copy left for patient/family.    CM will follow for transition of care needs-- Will order home 02 if required.   [facility] does accept [facility].     Son will transport home.    Care Management Interventions  PCP Verified by CM: Yes  Mode of Transport at Discharge:  (car)  Transition of Care Consult (CM Consult): Other  Discharge Durable Medical Equipment: No  Physical Therapy Consult: No  Occupational Therapy Consult: No  Speech Therapy Consult: No  Support Systems: Child(ren) (lives in two level home  with her brother and grandson.  Son is supportive-  self care and independent prior to admission   No AMD)  Confirm Follow Up Transport: Family  Discharge Location  Discharge Placement: Home

## 2019-12-11 NOTE — Progress Notes (Signed)
Progress Notes by [name], Derrill Bagnell M, DO at 12/11/19 1406                Author: [name], Mireya Meditz M, DO  Service: Hospitalist  Author Type: Physician       Filed: 12/11/19 1411  Date of Service: 12/11/19 1406  Status: Signed          Editor: [name], Marykathleen Russi M, DO (Physician)                                                                                    Hospitalist Progress Note   Conrad BurlingtonAmber M Mccade Sullenberger, DO   Answering service: [phone] OR [phone] from in house phone         Date of Service:  12/11/2019   NAME:  Allison Newman   DOB:  05/26/1940   MRN:  [id number]        Admission Summary:        37F p/w sob, obese hx of COPD      Interval history / Subjective:        Follow up COVID19 PNA. Patient seen and examined. Feels fine at rest. Attempted to wean oxygen while in room (5 to 3 liters) and was 85% on room air when moving  to the side.           Assessment & Plan:          COVID virus PNA:   COPD exacerbation: 2nd to above.   Acute hypoxic resp failure: 2nd to above   - CTA: no PE, b/l infiltrates. Remdesivir started 10/6   - Continue Steroids   - s/p empiric abx   - Albuterol inh PRN   - Zinc, ascorbic acid.O2   - wean to keep sats >90%   - encourage ambulation (PT/OT consult)    - incentive spirometer   - continue lasix BID       Hypokalemia: repleted    DM2: A1c 6.7, SSI, AccuChecks, monitor   HTN: chronic, stable, Home regimen, PRN BP meds. Monitor   Morbid obesity: BMI 52. 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 TSH 0.27.   UTI: ruled out.      Encourage ambulation, incentive spirometer. Stable to transfer to medical floor       Code status: Full   DVT prophylaxis: lovenox   Care Plan discussed with: Patient/Family and Nurse   Disposition: TBD 1-2days pending clinical improvement and oxygen requirements           Hospital Problems   Date Reviewed:  12/09/2019                     Codes  Class  Noted  POA              *  (Principal) Acute respiratory failure with hypoxia [facility](H[facility])  ICD-10-CM: J96.01   ICD-9-CM: 518.81    12/08/2019  Yes                         Review of Systems:  Negative unless stated above        Vital Signs:      Last 24hrs VS reviewed since prior progress note. Most recent are:   Visit Vitals      BP  112/66 (BP 1 Location: Left lower arm, BP Patient Position: At rest)     Pulse  78     Temp  98.4 ??F (36.9 ??C)     Resp  18     Ht   (1.626 m)     Wt  136.4 kg (300 lb 11.3 oz)     SpO2  90%        BMI  51.62 kg/m??              Intake/Output Summary (Last 24 hours) at 12/11/2019 1406   Last data filed at 12/11/2019 0332     Gross per 24 hour        Intake  --        Output  1700 ml        Net  -1700 ml              Physical Examination:     Evaluated face to face and examined 12/11/19      General:  Alert, oriented, No acute distress. Morbidly obese.   Resp:  No accessory muscle use, Good AE, no wheezes. no crepitations   Abd:  Soft, non-tender, non-distended, BS+   Extremities:  No cyanosis or clubbing, no significant edema   Neuro:  Grossly normal, no focal neuro deficits, follows commands, speech wnl   Psych:  Good insight, not agitated.        Data Review:      Review and/or order of clinical lab test   Review and/or order of tests in the radiology section of CPT   Review and/or order of tests in the medicine section of CPT     Labs:          Recent Labs            12/09/19   0545  12/08/19   2106     WBC  2.7*  3.6     HGB  12.1  13.3     HCT  38.9  43.1         PLT  103*  124*          Recent Labs             12/11/19   0345  12/09/19   0545  12/08/19   1917     NA  137  139  137     K  3.3*  3.8  5.1     CL  98  108  107     CO2  BUN  23*  16  20     CREA  0.81  0.57  0.81     GLU  154*  149*  158*     CA  8.6  8.1*  8.6     MG   --   1.7  1.9          PHOS   --   2.9   --           Recent Labs             12/11/19   0345  12/09/19   0545  12/08/19   1917  ALT  13  15  17      AP  47  43*   50     TBILI  0.4  0.4  0.6     TP  7.3  6.7  7.9     ALB  2.7*  2.8*  3.0*          GLOB  4.6*  3.9  4.9*        No results for input(s): INR, PTP, APTT, INREXT, INREXT in the last 72 hours.      Recent Labs           12/09/19   0259        FERR  122         No results found for: FOL, RBCF    No results for input(s): PH, PCO2, PO2 in the last 72 hours.     Recent Labs            12/09/19   0545  12/09/19   0259         TROIQ  <0.05  <0.05          Lab Results         Component  Value  Date/Time            Cholesterol, total  116  06/08/2017 04:00 AM       HDL Cholesterol  50  06/08/2017 04:00 AM       LDL, calculated  46.2  06/08/2017 04:00 AM       Triglyceride  99  06/08/2017 04:00 AM            CHOL/HDL Ratio  2.3  06/08/2017 04:00 AM          Lab Results         Component  Value  Date/Time            Glucose (POC)  146 (H)  12/11/2019 11:14 AM       Glucose (POC)  170 (H)  12/11/2019 08:54 AM       Glucose (POC)  251 (H)  12/10/2019 09:10 PM       Glucose (POC)  213 (H)  12/10/2019 04:28 PM            Glucose (POC)  212 (H)  12/10/2019 12:24 PM          Lab Results         Component  Value  Date/Time            Color  YELLOW/STRAW  12/09/2019 02:58 AM       Appearance  CLEAR  12/09/2019 02:58 AM       Specific gravity  1.026  12/09/2019 02:58 AM       pH (UA)  5.5  12/09/2019 02:58 AM       Protein  30 (A)  12/09/2019 02:58 AM       Glucose  Negative  12/09/2019 02:58 AM       Ketone  15 (A)  12/09/2019 02:58 AM       Bilirubin  Negative  12/09/2019 02:58 AM       Urobilinogen  1.0  12/09/2019 02:58 AM       Nitrites  Negative  12/09/2019 02:58 AM       Leukocyte Esterase  TRACE (A)  12/09/2019 02:58 AM       Epithelial cells  FEW  12/09/2019 02:58 AM       Bacteria  1+ (A)  12/09/2019 02:58 AM       WBC  5-10  12/09/2019 02:58 AM            RBC  0-5  12/09/2019 02:58 AM          Medications Reviewed:          Current Facility-Administered Medications          Medication  Dose  Route  Frequency           ?   remdesivir 100 mg in 0.9% sodium chloride 250 mL IVPB   100 mg  IntraVENous  Q24H     ?  aspirin delayed-release tablet 81 mg   81 mg  Oral  DAILY     ?  lisinopriL (PRINIVIL, ZESTRIL) tablet 10 mg   10 mg  Oral  DAILY     ?  methIMAzole (TAPAZOLE) tablet 5 mg   5 mg  Oral  DAILY     ?  metoprolol tartrate (LOPRESSOR) tablet 50 mg   50 mg  Oral  BID     ?  oxyCODONE IR (ROXICODONE) tablet 5 mg   5 mg  Oral  Q4H PRN     ?  pantoprazole (PROTONIX) tablet 40 mg   40 mg  Oral  DAILY     ?  rosuvastatin (CRESTOR) tablet 20 mg   20 mg  Oral  QHS     ?  triamterene-hydroCHLOROthiazide (MAXZIDE) 37.5-25 mg per tablet 1 Tablet   1 Tablet  Oral  DAILY     ?  sodium chloride (NS) flush 5-40 mL   5-40 mL  IntraVENous  Q8H     ?  sodium chloride (NS) flush 5-40 mL   5-40 mL  IntraVENous  PRN     ?  acetaminophen (TYLENOL) tablet 650 mg   650 mg  Oral  Q6H PRN          Or           ?  acetaminophen (TYLENOL) suppository 650 mg   650 mg  Rectal  Q6H PRN     ?  polyethylene glycol (MIRALAX) packet 17 g   17 g  Oral  DAILY PRN     ?  ondansetron (ZOFRAN ODT) tablet 4 mg   4 mg  Oral  Q8H PRN          Or           ?  ondansetron (ZOFRAN) injection 4 mg   4 mg  IntraVENous  Q6H PRN     ?  insulin lispro (HUMALOG) injection     SubCUTAneous  AC&HS     ?  glucose chewable tablet 16 g   4 Tablet  Oral  PRN     ?  dextrose (D50W) injection syrg 12.5-25 g   12.5-25 g  IntraVENous  PRN     ?  glucagon (GLUCAGEN) injection 1 mg   1 mg  IntraMUSCular  PRN     ?  albuterol-ipratropium (DUO-NEB) 2.5 MG-0.5 MG/3 ML   3 mL  Nebulization  Q4H PRN     ?  guaiFENesin-dextromethorphan (ROBITUSSIN DM) 100-10 mg/5 mL syrup 5 mL   5 mL  Oral  Q4H PRN     ?  dexAMETHasone (DECADRON) tablet 6 mg   6 mg  Oral  DAILY     ?  L.acidophilus-paracasei-S.thermophil-bifidobacter (RISAQUAD) 8 billion cell capsule   1 Capsule  Oral  DAILY     ?  enoxaparin (LOVENOX) injection 40 mg   40 mg  SubCUTAneous  Q12H     ?  furosemide (LASIX) injection 40 mg   40 mg   IntraVENous  Q12H           ?  sodium chloride (NS) flush 5-10 mL   5-10 mL  IntraVENous  PRN     ______________________________________________________________________   EXPECTED LENGTH OF STAY: 5d 9h   ACTUAL LENGTH OF STAY:          3                 Santanna Whitford Zenon Mayo, DO

## 2019-12-12 ENCOUNTER — Inpatient Hospital Stay: Admit: 2019-12-12 | Payer: MEDICARE | Primary: Geriatric Medicine

## 2019-12-12 LAB — METABOLIC PANEL, COMPREHENSIVE
A-G Ratio: 0.6 — ABNORMAL LOW (ref 1.1–2.2)
ALT (SGPT): 16 U/L (ref 12–78)
AST (SGOT): 18 U/L (ref 15–37)
Albumin: 2.7 g/dL — ABNORMAL LOW (ref 3.5–5.0)
Alk. phosphatase: 49 U/L (ref 45–117)
Anion gap: 3 mmol/L — ABNORMAL LOW (ref 5–15)
BUN/Creatinine ratio: 43 — ABNORMAL HIGH (ref 12–20)
BUN: 34 MG/DL — ABNORMAL HIGH (ref 6–20)
Bilirubin, total: 0.4 MG/DL (ref 0.2–1.0)
CO2: 33 mmol/L — ABNORMAL HIGH (ref 21–32)
Calcium: 8.5 MG/DL (ref 8.5–10.1)
Chloride: 99 mmol/L (ref 97–108)
Creatinine: 0.8 MG/DL (ref 0.55–1.02)
GFR est AA: >60 mL/min/{1.73_m2} (ref >60)
GFR est non-AA: >60 mL/min/{1.73_m2} (ref >60)
Globulin: 4.7 g/dL — ABNORMAL HIGH (ref 2.0–4.0)
Glucose: 197 mg/dL — ABNORMAL HIGH (ref 65–100)
Potassium: 3.6 mmol/L (ref 3.5–5.1)
Protein, total: 7.4 g/dL (ref 6.4–8.2)
Sodium: 135 mmol/L — ABNORMAL LOW (ref 136–145)

## 2019-12-12 LAB — GLUCOSE, POC
Glucose (POC): 314 mg/dL — ABNORMAL HIGH (ref 65–117)
Glucose (POC): 192 mg/dL — ABNORMAL HIGH (ref 65–117)
Glucose (POC): 211 mg/dL — ABNORMAL HIGH (ref 65–117)
Glucose (POC): 280 mg/dL — ABNORMAL HIGH (ref 65–117)

## 2019-12-12 LAB — COMPREHENSIVE METABOLIC PANEL
ALT: 16 U/L (ref 12–78)
AST: 18 U/L (ref 15–37)
Albumin/Globulin Ratio: 0.6 — ABNORMAL LOW (ref 1.1–2.2)
Albumin: 2.7 g/dL — ABNORMAL LOW (ref 3.5–5.0)
Alkaline Phosphatase: 49 U/L (ref 45–117)
Anion Gap: 3 mmol/L — ABNORMAL LOW (ref 5–15)
BUN: 34 MG/DL — ABNORMAL HIGH (ref 6–20)
Bun/Cre Ratio: 43 — ABNORMAL HIGH (ref 12–20)
CO2: 33 mmol/L — ABNORMAL HIGH (ref 21–32)
Calcium: 8.5 MG/DL (ref 8.5–10.1)
Chloride: 99 mmol/L (ref 97–108)
Creatinine: 0.8 MG/DL (ref 0.55–1.02)
EGFR IF NonAfrican American: >60 mL/min/{1.73_m2} (ref >60)
GFR African American: >60 mL/min/{1.73_m2} (ref >60)
Globulin: 4.7 g/dL — ABNORMAL HIGH (ref 2.0–4.0)
Glucose: 197 mg/dL — ABNORMAL HIGH (ref 65–100)
Potassium: 3.6 mmol/L (ref 3.5–5.1)
Sodium: 135 mmol/L — ABNORMAL LOW (ref 136–145)
Total Bilirubin: 0.4 MG/DL (ref 0.2–1.0)
Total Protein: 7.4 g/dL (ref 6.4–8.2)

## 2019-12-12 LAB — POCT GLUCOSE
POC Glucose: 314 mg/dL — ABNORMAL HIGH (ref 65–117)
POC Glucose: 192 mg/dL — ABNORMAL HIGH (ref 65–117)
POC Glucose: 211 mg/dL — ABNORMAL HIGH (ref 65–117)
POC Glucose: 280 mg/dL — ABNORMAL HIGH (ref 65–117)

## 2019-12-12 MED ORDER — ALBUTEROL SULFATE HFA 90 MCG/ACTUATION AEROSOL INHALER
90 mcg/actuation | Freq: Three times a day (TID) | RESPIRATORY_TRACT | Status: DC
Start: 2019-12-12 — End: 2019-12-15
  Administered 2019-12-12 – 2019-12-15 (×10): via RESPIRATORY_TRACT

## 2019-12-12 MED FILL — DEXAMETHASONE 1 MG TAB: 1 mg | ORAL | Qty: 2

## 2019-12-12 MED FILL — METOPROLOL TARTRATE 50 MG TAB: 50 mg | ORAL | Qty: 1

## 2019-12-12 MED FILL — INSULIN LISPRO 100 UNIT/ML INJECTION: 100 unit/mL | SUBCUTANEOUS | Qty: 1

## 2019-12-12 MED FILL — ROSUVASTATIN 10 MG TAB: 10 mg | ORAL | Qty: 2

## 2019-12-12 MED FILL — ASPIRIN 81 MG TAB, DELAYED RELEASE: 81 mg | ORAL | Qty: 1

## 2019-12-12 MED FILL — METHIMAZOLE 5 MG TAB: 5 mg | ORAL | Qty: 1

## 2019-12-12 MED FILL — FUROSEMIDE 10 MG/ML IJ SOLN: 10 mg/mL | INTRAMUSCULAR | Qty: 4

## 2019-12-12 MED FILL — TRIAMTERENE-HYDROCHLOROTHIAZIDE 37.5 MG-25 MG TAB: ORAL | Qty: 1

## 2019-12-12 MED FILL — PANTOPRAZOLE 40 MG TAB, DELAYED RELEASE: 40 mg | ORAL | Qty: 1

## 2019-12-12 MED FILL — OXYCODONE 5 MG TAB: 5 mg | ORAL | Qty: 1

## 2019-12-12 MED FILL — ENOXAPARIN 40 MG/0.4 ML SUB-Q SYRINGE: 40 mg/0.4 mL | SUBCUTANEOUS | Qty: 0.4

## 2019-12-12 MED FILL — PROAIR HFA 90 MCG/ACTUATION AEROSOL INHALER: 90 mcg/actuation | RESPIRATORY_TRACT | Qty: 0

## 2019-12-12 MED FILL — LISINOPRIL 10 MG TAB: 10 mg | ORAL | Qty: 1

## 2019-12-12 MED FILL — RISAQUAD 8 BILLION CELL CAPSULE: 8 billion cell | ORAL | Qty: 1

## 2019-12-12 MED FILL — VEKLURY 100 MG INTRAVENOUS POWDER FOR SOLUTION: 100 mg | INTRAVENOUS | Qty: 100

## 2019-12-12 NOTE — Progress Notes (Signed)
Problem: Risk for Spread of Infection  Goal: Prevent transmission of infectious organism to others  Description: Prevent the transmission of infectious organisms to other patients, staff members, and visitors.  Outcome: Progressing Towards Goal     Problem: Diabetes Self-Management  Goal: *Disease process and treatment process  Description: Define diabetes and identify own type of diabetes; list 3 options for treating diabetes.  Outcome: Progressing Towards Goal

## 2019-12-12 NOTE — Progress Notes (Signed)
Progress Notes by [name], Chevelle Durr M, DO at 12/12/19 1647                Author: [name], Coleby Yett M, DO  Service: Hospitalist  Author Type: Physician       Filed: 12/12/19 1656  Date of Service: 12/12/19 1647  Status: Signed          Editor: [name], Jaylea Plourde M, DO (Physician)                                                                                    Hospitalist Progress Note   Conrad BurlingtonAmber M Maeleigh Buschman, DO   Answering service: [phone] OR [phone] from in house phone         Date of Service:  12/12/2019   NAME:  Langston MaskerJean C Liguori   DOB:  11-04-1940   MRN:  [id number]        Admission Summary:        59F p/w sob, obese hx of COPD      Interval history / Subjective:        Follow up COVID19 PNA. Patient seen and examined. Continues to feel fine at rest. CXR completed this AM and demonstrates progression of bilateral opacities.           Assessment & Plan:          COVID virus PNA:   COPD exacerbation: 2nd to above.   Acute hypoxic resp failure: 2nd to above   - CTA: no PE, b/l infiltrates. Remdesivir started 10/6   - Continue Steroids   - s/p empiric abx   - Albuterol inh    - Zinc, ascorbic acid.O2   - wean to keep sats >90%   - encourage ambulation (PTconsult)    - incentive spirometer   - continue lasix BID      Hypokalemia: repleted    DM2: A1c 6.7, SSI, AccuChecks, monitor   HTN: chronic, stable, Home regimen, PRN BP meds. Monitor   Morbid obesity: BMI 52. 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 TSH 0.27.   UTI: ruled out      Encourage ambulation, incentive spirometer. PT consult ordered      Code status: Full   DVT prophylaxis: lovenox   Care Plan discussed with: Patient/Family and Nurse   Disposition: Pending clinical improvement            Hospital Problems   Date Reviewed:  12/09/2019                     Codes  Class  Noted  POA              * (Principal) Acute respiratory failure with hypoxia (HCC)  ICD-10-CM: J96.01    ICD-9-CM: 518.81    12/08/2019  Yes                         Review of Systems:     Negative unless stated above        Vital Signs:      Last 24hrs  VS reviewed since prior progress note. Most recent are:   Visit Vitals      BP  111/63 (BP 1 Location: Left lower arm, BP Patient Position: At rest)     Pulse  61     Temp  99.1 ??F (37.3 ??C)     Resp  17     Ht  5\' 4"  (1.626 m)     Wt  140 kg (308 lb 10.3 oz)     SpO2  95%        BMI  52.98 kg/m??              Intake/Output Summary (Last 24 hours) at 12/12/2019 1647   Last data filed at 12/12/2019 0417     Gross per 24 hour        Intake  --        Output  1400 ml        Net  -1400 ml              Physical Examination:     Evaluated face to face and examined 12/12/19      General:  Alert, oriented, No acute distress. Morbidly obese.   Resp:  No accessory muscle use, Good AE, no wheezes. no crepitations   Abd:  Soft, non-tender, non-distended, BS+   Extremities:  No cyanosis or clubbing, no significant edema   Neuro:  Grossly normal, no focal neuro deficits, follows commands, speech wnl   Psych:  Good insight, not agitated.        Data Review:      Review and/or order of clinical lab test   Review and/or order of tests in the radiology section of CPT   Review and/or order of tests in the medicine section of CPT     Labs:        No results for input(s): WBC, HGB, HCT, PLT, HGBEXT, HCTEXT, PLTEXT, HGBEXT, HCTEXT, PLTEXT in the last 72 hours.     Recent Labs            12/12/19   0522  12/11/19   0345     NA  135*  137     K  3.6  3.3*     CL  99  98     CO2  33*  31     BUN  34*  23*     CREA  0.80  0.81     GLU  197*  154*         CA  8.5  8.6          Recent Labs            12/12/19   0522  12/11/19   0345     ALT  16  13     AP  49  47     TBILI  0.4  0.4     TP  7.4  7.3     ALB  2.7*  2.7*         GLOB  4.7*  4.6*        No results for input(s): INR, PTP, APTT, INREXT, INREXT in the last 72 hours.    No results for input(s): FE, TIBC, PSAT, FERR in the last 72 hours.    No  results found for: FOL, RBCF    No results for input(s): PH, PCO2, PO2 in the last 72 hours.   No results for input(s): CPK, CKNDX, TROIQ  in the last 72 hours.      No lab exists for component: CPKMB     Lab Results         Component  Value  Date/Time            Cholesterol, total  116  06/08/2017 04:00 AM       HDL Cholesterol  50  06/08/2017 04:00 AM       LDL, calculated  46.2  06/08/2017 04:00 AM       Triglyceride  99  06/08/2017 04:00 AM            CHOL/HDL Ratio  2.3  06/08/2017 04:00 AM          Lab Results         Component  Value  Date/Time            Glucose (POC)  280 (H)  12/12/2019 04:43 PM       Glucose (POC)  211 (H)  12/12/2019 11:34 AM       Glucose (POC)  192 (H)  12/12/2019 06:51 AM       Glucose (POC)  314 (H)  12/11/2019 09:50 PM            Glucose (POC)  294 (H)  12/11/2019 04:30 PM          Lab Results         Component  Value  Date/Time            Color  YELLOW/STRAW  12/09/2019 02:58 AM       Appearance  CLEAR  12/09/2019 02:58 AM       Specific gravity  1.026  12/09/2019 02:58 AM       pH (UA)  5.5  12/09/2019 02:58 AM       Protein  30 (A)  12/09/2019 02:58 AM       Glucose  Negative  12/09/2019 02:58 AM       Ketone  15 (A)  12/09/2019 02:58 AM       Bilirubin  Negative  12/09/2019 02:58 AM       Urobilinogen  1.0  12/09/2019 02:58 AM       Nitrites  Negative  12/09/2019 02:58 AM       Leukocyte Esterase  TRACE (A)  12/09/2019 02:58 AM       Epithelial cells  FEW  12/09/2019 02:58 AM       Bacteria  1+ (A)  12/09/2019 02:58 AM       WBC  5-10  12/09/2019 02:58 AM            RBC  0-5  12/09/2019 02:58 AM          Medications Reviewed:          Current Facility-Administered Medications          Medication  Dose  Route  Frequency           ?  remdesivir 100 mg in 0.9% sodium chloride 250 mL IVPB   100 mg  IntraVENous  Q24H     ?  aspirin delayed-release tablet 81 mg   81 mg  Oral  DAILY     ?  lisinopriL (PRINIVIL, ZESTRIL) tablet 10 mg   10 mg  Oral  DAILY     ?  methIMAzole (TAPAZOLE)  tablet 5 mg   5 mg  Oral  DAILY     ?  metoprolol tartrate (LOPRESSOR) tablet 50 mg  50 mg  Oral  BID     ?  oxyCODONE IR (ROXICODONE) tablet 5 mg   5 mg  Oral  Q4H PRN     ?  pantoprazole (PROTONIX) tablet 40 mg   40 mg  Oral  DAILY     ?  rosuvastatin (CRESTOR) tablet 20 mg   20 mg  Oral  QHS     ?  triamterene-hydroCHLOROthiazide (MAXZIDE) 37.5-25 mg per tablet 1 Tablet   1 Tablet  Oral  DAILY     ?  sodium chloride (NS) flush 5-40 mL   5-40 mL  IntraVENous  Q8H     ?  sodium chloride (NS) flush 5-40 mL   5-40 mL  IntraVENous  PRN     ?  acetaminophen (TYLENOL) tablet 650 mg   650 mg  Oral  Q6H PRN          Or           ?  acetaminophen (TYLENOL) suppository 650 mg   650 mg  Rectal  Q6H PRN     ?  polyethylene glycol (MIRALAX) packet 17 g   17 g  Oral  DAILY PRN           ?  ondansetron (ZOFRAN ODT) tablet 4 mg   4 mg  Oral  Q8H PRN          Or           ?  ondansetron (ZOFRAN) injection 4 mg   4 mg  IntraVENous  Q6H PRN     ?  insulin lispro (HUMALOG) injection     SubCUTAneous  AC&HS     ?  glucose chewable tablet 16 g   4 Tablet  Oral  PRN     ?  dextrose (D50W) injection syrg 12.5-25 g   12.5-25 g  IntraVENous  PRN     ?  glucagon (GLUCAGEN) injection 1 mg   1 mg  IntraMUSCular  PRN     ?  albuterol-ipratropium (DUO-NEB) 2.5 MG-0.5 MG/3 ML   3 mL  Nebulization  Q4H PRN     ?  guaiFENesin-dextromethorphan (ROBITUSSIN DM) 100-10 mg/5 mL syrup 5 mL   5 mL  Oral  Q4H PRN     ?  dexAMETHasone (DECADRON) tablet 6 mg   6 mg  Oral  DAILY     ?  L.acidophilus-paracasei-S.thermophil-bifidobacter (RISAQUAD) 8 billion cell capsule   1 Capsule  Oral  DAILY     ?  enoxaparin (LOVENOX) injection 40 mg   40 mg  SubCUTAneous  Q12H     ?  furosemide (LASIX) injection 40 mg   40 mg  IntraVENous  Q12H           ?  sodium chloride (NS) flush 5-10 mL   5-10 mL  IntraVENous  PRN     ______________________________________________________________________   EXPECTED LENGTH OF STAY: 5d 9h   ACTUAL LENGTH OF STAY:          4                  Braydon Kullman Zenon Mayo, DO

## 2019-12-12 NOTE — Progress Notes (Signed)
 TRANSFER - OUT REPORT:    Verbal report given to 6E nurse (name) on Allison Newman  being transferred to 6East(unit) for routine progression of care       Report consisted of patient's Situation, Background, Assessment and   Recommendations(SBAR).     Information from the following report(s) SBAR, Kardex, ED Summary, Intake/Output, MAR, Recent Results and Cardiac Rhythm NS/SB was reviewed with the receiving nurse.    Lines:   Peripheral IV 12/10/19 Right Arm (Active)   Site Assessment Clean, dry, & intact 12/11/19 0400   Phlebitis Assessment 0 12/11/19 0400   Infiltration Assessment 0 12/11/19 0400   Dressing Status Clean, dry, & intact 12/11/19 0400   Dressing Type Transparent 12/11/19 0400   Hub Color/Line Status Pink;Capped 12/11/19 0400   Action Taken Open ports on tubing capped 12/11/19 0400   Alcohol Cap Used Yes 12/11/19 0400        Opportunity for questions and clarification was provided.      Patient transported with:   O2 @ 6 liters

## 2019-12-13 LAB — METABOLIC PANEL, COMPREHENSIVE
A-G Ratio: 0.6 — ABNORMAL LOW (ref 1.1–2.2)
ALT (SGPT): 18 U/L (ref 12–78)
AST (SGOT): 15 U/L (ref 15–37)
Albumin: 2.9 g/dL — ABNORMAL LOW (ref 3.5–5.0)
Alk. phosphatase: 48 U/L (ref 45–117)
Anion gap: 6 mmol/L (ref 5–15)
BUN/Creatinine ratio: 46 — ABNORMAL HIGH (ref 12–20)
BUN: 38 MG/DL — ABNORMAL HIGH (ref 6–20)
Bilirubin, total: 0.4 MG/DL (ref 0.2–1.0)
CO2: 31 mmol/L (ref 21–32)
Calcium: 9.1 MG/DL (ref 8.5–10.1)
Chloride: 97 mmol/L (ref 97–108)
Creatinine: 0.82 MG/DL (ref 0.55–1.02)
GFR est AA: >60 mL/min/{1.73_m2} (ref >60)
GFR est non-AA: >60 mL/min/{1.73_m2} (ref >60)
Globulin: 4.7 g/dL — ABNORMAL HIGH (ref 2.0–4.0)
Glucose: 201 mg/dL — ABNORMAL HIGH (ref 65–100)
Potassium: 3.5 mmol/L (ref 3.5–5.1)
Protein, total: 7.6 g/dL (ref 6.4–8.2)
Sodium: 134 mmol/L — ABNORMAL LOW (ref 136–145)

## 2019-12-13 LAB — C REACTIVE PROTEIN, QT: C-Reactive protein: 2.86 mg/dL — ABNORMAL HIGH (ref 0.00–0.60)

## 2019-12-13 LAB — GLUCOSE, POC
Glucose (POC): 447 mg/dL — ABNORMAL HIGH (ref 65–117)
Glucose (POC): 155 mg/dL — ABNORMAL HIGH (ref 65–117)
Glucose (POC): 177 mg/dL — ABNORMAL HIGH (ref 65–117)
Glucose (POC): 258 mg/dL — ABNORMAL HIGH (ref 65–117)

## 2019-12-13 LAB — POCT GLUCOSE
POC Glucose: 447 mg/dL — ABNORMAL HIGH (ref 65–117)
POC Glucose: 155 mg/dL — ABNORMAL HIGH (ref 65–117)
POC Glucose: 177 mg/dL — ABNORMAL HIGH (ref 65–117)
POC Glucose: 258 mg/dL — ABNORMAL HIGH (ref 65–117)

## 2019-12-13 LAB — COMPREHENSIVE METABOLIC PANEL
ALT: 18 U/L (ref 12–78)
AST: 15 U/L (ref 15–37)
Albumin/Globulin Ratio: 0.6 — ABNORMAL LOW (ref 1.1–2.2)
Albumin: 2.9 g/dL — ABNORMAL LOW (ref 3.5–5.0)
Alkaline Phosphatase: 48 U/L (ref 45–117)
Anion Gap: 6 mmol/L (ref 5–15)
BUN: 38 MG/DL — ABNORMAL HIGH (ref 6–20)
Bun/Cre Ratio: 46 — ABNORMAL HIGH (ref 12–20)
CO2: 31 mmol/L (ref 21–32)
Calcium: 9.1 MG/DL (ref 8.5–10.1)
Chloride: 97 mmol/L (ref 97–108)
Creatinine: 0.82 MG/DL (ref 0.55–1.02)
EGFR IF NonAfrican American: >60 mL/min/{1.73_m2} (ref >60)
GFR African American: >60 mL/min/{1.73_m2} (ref >60)
Globulin: 4.7 g/dL — ABNORMAL HIGH (ref 2.0–4.0)
Glucose: 201 mg/dL — ABNORMAL HIGH (ref 65–100)
Potassium: 3.5 mmol/L (ref 3.5–5.1)
Sodium: 134 mmol/L — ABNORMAL LOW (ref 136–145)
Total Bilirubin: 0.4 MG/DL (ref 0.2–1.0)
Total Protein: 7.6 g/dL (ref 6.4–8.2)

## 2019-12-13 LAB — C-REACTIVE PROTEIN: CRP: 2.86 mg/dL — ABNORMAL HIGH (ref 0.00–0.60)

## 2019-12-13 MED ORDER — INSULIN LISPRO 100 UNIT/ML INJECTION
100 unit/mL | Freq: Once | SUBCUTANEOUS | Status: AC
Start: 2019-12-13 — End: 2019-12-12
  Administered 2019-12-13: 03:00:00 via SUBCUTANEOUS

## 2019-12-13 MED ORDER — FUROSEMIDE 10 MG/ML IJ SOLN
10 mg/mL | Freq: Every day | INTRAMUSCULAR | Status: DC
Start: 2019-12-13 — End: 2019-12-15
  Administered 2019-12-14 – 2019-12-15 (×2): via INTRAVENOUS

## 2019-12-13 MED FILL — VEKLURY 100 MG INTRAVENOUS POWDER FOR SOLUTION: 100 mg | INTRAVENOUS | Qty: 100

## 2019-12-13 MED FILL — FUROSEMIDE 10 MG/ML IJ SOLN: 10 mg/mL | INTRAMUSCULAR | Qty: 4

## 2019-12-13 MED FILL — TRIAMTERENE-HYDROCHLOROTHIAZIDE 37.5 MG-25 MG TAB: ORAL | Qty: 1

## 2019-12-13 MED FILL — LISINOPRIL 10 MG TAB: 10 mg | ORAL | Qty: 1

## 2019-12-13 MED FILL — INSULIN LISPRO 100 UNIT/ML INJECTION: 100 unit/mL | SUBCUTANEOUS | Qty: 1

## 2019-12-13 MED FILL — ENOXAPARIN 40 MG/0.4 ML SUB-Q SYRINGE: 40 mg/0.4 mL | SUBCUTANEOUS | Qty: 0.4

## 2019-12-13 MED FILL — METOPROLOL TARTRATE 50 MG TAB: 50 mg | ORAL | Qty: 1

## 2019-12-13 MED FILL — RISAQUAD 8 BILLION CELL CAPSULE: 8 billion cell | ORAL | Qty: 1

## 2019-12-13 MED FILL — PANTOPRAZOLE 40 MG TAB, DELAYED RELEASE: 40 mg | ORAL | Qty: 1

## 2019-12-13 MED FILL — ASPIRIN 81 MG TAB, DELAYED RELEASE: 81 mg | ORAL | Qty: 1

## 2019-12-13 MED FILL — DEXAMETHASONE 1 MG TAB: 1 mg | ORAL | Qty: 2

## 2019-12-13 MED FILL — METHIMAZOLE 5 MG TAB: 5 mg | ORAL | Qty: 1

## 2019-12-13 MED FILL — ROSUVASTATIN 10 MG TAB: 10 mg | ORAL | Qty: 2

## 2019-12-13 MED FILL — OXYCODONE 5 MG TAB: 5 mg | ORAL | Qty: 1

## 2019-12-13 NOTE — Progress Notes (Signed)
Pt is sitting in the chair with even and unlabored respirations on 5 L of oxygen via nasal cannula. Pt has no complaints of pain at this time. Continuing IV antibiotics at this time. Physical therapy saw the pt today and pt ws able to get from the bed to the chair. Pt has a bariatric bedside commode in her room. All safety precautions are in place. Chair in low position and locked.

## 2019-12-13 NOTE — Progress Notes (Signed)
Problem: Mobility Impaired (Adult and Pediatric)  Goal: *Acute Goals and Plan of Care (Insert Text)  Description: FUNCTIONAL STATUS PRIOR TO ADMISSION: Patient was modified independent using a rolling walker for functional mobility; used office chair on wheels in kitchen.    HOME SUPPORT PRIOR TO ADMISSION: The patient lived with grandson and brother but did not require assist.    Physical Therapy Goals  Initiated 12/13/2019  1.  Patient will move from supine to sit and sit to supine  and scoot up and down in bed with modified independence within 7 day(s).    2.  Patient will transfer from bed to chair and chair to bed with modified independence using the least restrictive device within 7 day(s).  3.  Patient will perform sit to stand with modified independence within 7 day(s).  4.  Patient will ambulate with modified independence for > 50 feet with the least restrictive device within 7 day(s).       PHYSICAL THERAPY EVALUATION  Patient: CARIDAD DAMORE (79 y.o. female)  Date: 12/13/2019  Primary Diagnosis: Acute respiratory failure with hypoxia (HCC) [J96.01]        Precautions:   Fall, COVID-19 +    ASSESSMENT  Based on the objective data described below, the patient presents with generalized weakness, decline in functional mobility, SOB observed with activity, stable oxygen sats improved from room air at rest 90 to 94% - 96% after transfer to bedside chair.  Pt at high risk for deconditioning with immobility secondary to co-morbidities  - obesity, arthritis - joint pain, COPD.  Recommend increasing time up in chair, use of Bariatric BSC vs. Pure wick during the day, and increasing amb with RW in room.    For the weekend, recommend patient to complete as able in order to maintain strength, endurance and independence with nursing: OOB to chair 3x/day with RW and use of BSC during the day.  PT to follow-up with patient after the weekend.      Current Level of Function Impacting Discharge (mobility/balance): SBG to  CGA for bed mobility, transfers with RW    Functional Outcome Measure:  The patient scored 55/100 on the Barthel Index outcome measure .      Other factors to consider for discharge: supportive family     Patient will benefit from skilled therapy intervention to address the above noted impairments.       PLAN :  Recommendations and Planned Interventions: bed mobility training, transfer training, gait training, therapeutic exercises, patient and family training/education, and therapeutic activities      Frequency/Duration: Patient will be followed by physical therapy:  5 times a week to address goals.    Recommendation for discharge: (in order for the patient to meet his/her long term goals)  Physical therapy at least 2 days/week in the home     This discharge recommendation:  Has been made in collaboration with the attending provider and/or case management    IF patient discharges home will need the following DME: patient owns DME required for discharge         SUBJECTIVE:   Patient stated "I sleep in my recliner at home - but I can do the 3 steps up to the kitchen and I go up to full bath to shower."    OBJECTIVE DATA SUMMARY:   HISTORY:    Past Medical History:   Diagnosis Date    Abdominal pain 05/08/2013    Abnormal finding on EKG 11/17/2013    ACP (  advance care planning) 05/03/2015    Acute pulmonary embolism (HCC) 06/08/2017    Arthritis     Chronic kidney disease (CKD), 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    Chronic pain of left knee 08/13/2014    Chronic right hip pain 03/17/2013    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    Diabetes (HCC)     Elevated BUN 09/13/2011    Finger lesion 03/02/2016    Gallbladder polyp 04/08/2013    Gastrointestinal disorder     GERD    GERD (gastroesophageal reflux disease)     Hypercholesterolemia     Hypertension     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    Microalbuminuria 08/29/2013    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,  adult (HCC) 08/28/2011    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    On aspirin at home 05/03/2015    Other unknown and unspecified cause of morbidity or mortality     hx of bronchitis    Rash, skin 09/25/2012    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) 03/02/2016    Slow transit constipation 08/13/2014    Umbilical hernia 05/08/2013     Past Surgical History:   Procedure Laterality Date    COLONOSCOPY N/A 04/23/2018    COLONOSCOPY AND ESOPHAGOGASTRODUODENOSCOPY (EGD) performed by Erling Conte., MD at [facility]    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    HX ORTHOPAEDIC  2000    screw in foot left    HX OTHER SURGICAL      colonoscopy    HX TUBAL LIGATION         Personal factors and/or comorbidities impacting plan of care: as above    Home Situation  Home Environment: Private residence  # Steps to Enter: 0 (back entrance)  One/Two Story Residence: Split level (sleeps in recliner only 3 steps)  # of Interior Steps: 3  Interior Rails: Left  Living Alone: No  Support Systems: Other Family Member(s) (grandson, brother)  Patient Expects to be Discharged to:: House  Current DME Used/Available at Home: Gilmer Mor, straight, Environmental consultant, rolling, Grab bars (hand held shower)  Tub or Shower Type: Air traffic controller (built in Information systems manager)    EXAMINATION/PRESENTATION/DECISION MAKING:   Critical Behavior:  Neurologic State: Alert  Orientation Level: Oriented X4  Cognition: Appropriate decision making, Appropriate safety awareness  Safety/Judgement: Awareness of environment, Good awareness of safety precautions  Range Of Motion:  AROM: Generally decreased, functional                       Strength:    Strength: Generally decreased, functional                    Tone & Sensation:   Tone: Normal              Sensation: Intact               Coordination:  Coordination: Within functional limits  Functional Mobility:  Bed Mobility:     Supine to Sit: Stand-by assistance;Additional time;Bed Modified         Transfers:  Sit to Stand: Contact guard assistance  Stand to Sit: Contact guard assistance  Stand Pivot Transfers: Contact guard assistance                    Balance:   Sitting: Intact;Without support  Standing: Impaired;Without support  Standing - Static: Good;Constant support (RW)  Standing - Dynamic : Fair;Constant support  Ambulation/Gait Training:  Distance (ft): 3 Feet (ft)  Assistive Device: Walker, rolling;Gait belt  Ambulation - Level of Assistance: Contact guard assistance        Gait Abnormalities: Antalgic;Decreased step clearance        Base of Support: Widened;Shift to left (c/o pain right knee)     Speed/Cadence: Slow;Shuffled  Step Length: Right shortened;Left shortened          Functional Measure:  Barthel Index:    Bathing: 0  Bladder: 10  Bowels: 10  Grooming: 5  Dressing: 5  Feeding: 10  Mobility: 0  Stairs: 0  Toilet Use: 5 (BSC)  Transfer (Bed to Chair and Back): 10  Total: 55/100       The Barthel ADL Index: Guidelines  1. The index should be used as a record of what a patient does, not as a record of what a patient could do.  2. The main aim is to establish degree of independence from any help, physical or verbal, however minor and for whatever reason.  3. The need for supervision renders the patient not independent.  4. A patient's performance should be established using the best available evidence. Asking the patient, friends/relatives and nurses are the usual sources, but direct observation and common sense are also important. However direct testing is not needed.  5. Usually the patient's performance over the preceding 24-48 hours is important, but occasionally longer periods will be relevant.  6. Middle categories imply that the patient supplies over 50 per cent of the effort.  7. Use of aids to be independent is allowed.    Clarisa Kindred., Barthel, D.W. [phone]). Functional evaluation: the Barthel Index. Md State Med J (14)2.  Zenaida Niece der [place], J.J.M.F, [place], Ian Malkin., Margret Chance.,  [place], [place]. (1999). Measuring the change indisability after inpatient rehabilitation; comparison of the responsiveness of the Barthel Index and Functional Independence Measure. Journal of Neurology, Neurosurgery, and Psychiatry, 66(4), [phone].  Dawson Bills, N.J.A, [place],  W.J.M, & Koop[place]chap, M.A. (2004.) Assessment of post-stroke quality of life in cost-effectiveness studies: The usefulness of the Barthel Index and the EuroQoL-5D. Quality of Life Research, 33, 151-76           Physical Therapy Evaluation Charge Determination   History Examination Presentation Decision-Making   LOW Complexity : Zero comorbidities / personal factors that will impact the outcome / POC LOW Complexity : 1-2 Standardized tests and measures addressing body structure, function, activity limitation and / or participation in recreation  LOW Complexity : Stable, uncomplicated  LOW Complexity : FOTO score of 75-100      Based on the above components, the patient evaluation is determined to be of the following complexity level: LOW     Pain Rating:  Pt denied pain - until standing - with weight bearing c/o right knee pain - chronic    Activity Tolerance:   SpO2 stable on RA  for OOB/few steps to bedside chair    After treatment patient left in no apparent distress:   Call bell within reach and recliner with legs elevated    COMMUNICATION/EDUCATION:   The patient's plan of care was discussed with: Registered nurse.     Fall prevention education was provided and the patient/caregiver indicated understanding., Patient/family have participated as able in goal setting and plan of care., and Patient/family agree to work toward stated goals and plan of care.  Thank you for this referral.  Osborne Casco, PT   Time Calculation: 45 mins

## 2019-12-13 NOTE — Progress Notes (Signed)
Progress Notes by Conrad Burlington, DO at 12/13/19 1734                Author: Conrad Burlington, DO  Service: Hospitalist  Author Type: Physician       Filed: 12/13/19 1737  Date of Service: 12/13/19 1734  Status: Signed          Editor: Conrad Burlington, DO (Physician)                                                                                    Hospitalist Progress Note   Conrad Burlington, DO   Answering service: [phone] OR [phone] from in house phone         Date of Service:  12/13/2019   NAME:  Allison Newman   DOB:  08/02/1940   MRN:  [id number]        Admission Summary:        57F p/w sob, obese hx of COPD      Interval history / Subjective:        Follow up COVID19 PNA. Patient seen and examined. Up to chair. Has good diuresis. Placed on room air during encounter and Sp02 dropped to mid to low 80s.            Assessment & Plan:          COVID virus PNA:   COPD exacerbation: 2nd to above.   Acute hypoxic resp failure: 2nd to above   - CTA: no PE, b/l infiltrates. Remdesivir started 10/6   - Continue Steroids   - s/p empiric abx   - Albuterol inh    - Zinc, ascorbic acid.O2   - wean to keep sats >90%   - encourage ambulation (PTconsult)    - incentive spirometer   - decrease lasix to daily       Hypokalemia: repleted    DM2: A1c 6.7, SSI, AccuChecks, monitor   HTN: chronic, stable, Home regimen, PRN BP meds. Monitor   Morbid obesity: BMI 52. 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 TSH 0.27.   UTI: ruled out      Encourage ambulation, incentive spirometer. PT consult ordered      Code status: Full   DVT prophylaxis: lovenox   Care Plan discussed with: Patient/Family and Nurse   Disposition: Pending clinical improvement            Hospital Problems   Date Reviewed:  01/03/20                     Codes  Class  Noted  POA              * (Principal) Acute respiratory failure with hypoxia (HCC)  ICD-10-CM: J96.01    ICD-9-CM: 518.81    12/08/2019  Yes                         Review of Systems:     Negative unless stated above        Vital Signs:  Last 24hrs VS reviewed since prior progress note. Most recent are:   Visit Vitals      BP  108/72 (BP 1 Location: Right arm, BP Patient Position: At rest)     Pulse  67     Temp  98.1 ??F (36.7 ??C)     Resp  18     Ht   (1.626 m)     Wt  138.5 kg (305 lb 5.4 oz)     SpO2  94%        BMI  52.41 kg/m??           No intake or output data in the 24 hours ending 12/13/19 1734         Physical Examination:     Evaluated face to face and examined 12/13/19      General:  Alert, oriented, No acute distress. Morbidly obese.   Resp:  No accessory muscle use, Good AE, no wheezes. no crepitations   Abd:  Soft, non-tender, non-distended, BS+   Extremities:  No cyanosis or clubbing, no significant edema   Neuro:  Grossly normal, no focal neuro deficits, follows commands, speech wnl   Psych:  Good insight, not agitated.        Data Review:      Review and/or order of clinical lab test   Review and/or order of tests in the radiology section of CPT   Review and/or order of tests in the medicine section of CPT     Labs:        No results for input(s): WBC, HGB, HCT, PLT, HGBEXT, HCTEXT, PLTEXT, HGBEXT, HCTEXT, PLTEXT in the last 72 hours.     Recent Labs             12/13/19   0313  12/12/19   0522  12/11/19   0345     NA  134*  135*  137     K  3.5  3.6  3.3*     CL  97  99  98     CO2  31  33*  31     BUN  38*  34*  23*     CREA  0.82  0.80  0.81     GLU  201*  197*  154*          CA  9.1  8.5  8.6          Recent Labs             12/13/19   0313  12/12/19   0522  12/11/19   0345     ALT  AP  48  49  47     TBILI  0.4  0.4  0.4     TP  7.6  7.4  7.3     ALB  2.9*  2.7*  2.7*          GLOB  4.7*  4.7*  4.6*        No results for input(s): INR, PTP, APTT, INREXT, INREXT in the last 72 hours.    No results for input(s): FE, TIBC, PSAT, FERR in the last 72 hours.    No results found  for: FOL, RBCF    No results for input(s): PH, PCO2, PO2 in the last 72 hours.   No results for input(s): CPK, CKNDX, TROIQ in the last 72 hours.  No lab exists for component: CPKMB     Lab Results         Component  Value  Date/Time            Cholesterol, total  116  06/08/2017 04:00 AM       HDL Cholesterol  50  06/08/2017 04:00 AM       LDL, calculated  46.2  06/08/2017 04:00 AM       Triglyceride  99  06/08/2017 04:00 AM            CHOL/HDL Ratio  2.3  06/08/2017 04:00 AM          Lab Results         Component  Value  Date/Time            Glucose (POC)  258 (H)  12/13/2019 03:59 PM       Glucose (POC)  177 (H)  12/13/2019 11:09 AM       Glucose (POC)  155 (H)  12/13/2019 07:04 AM       Glucose (POC)  447 (H)  12/12/2019 10:58 PM            Glucose (POC)  280 (H)  12/12/2019 04:43 PM          Lab Results         Component  Value  Date/Time            Color  YELLOW/STRAW  12/09/2019 02:58 AM       Appearance  CLEAR  12/09/2019 02:58 AM       Specific gravity  1.026  12/09/2019 02:58 AM       pH (UA)  5.5  12/09/2019 02:58 AM       Protein  30 (A)  12/09/2019 02:58 AM       Glucose  Negative  12/09/2019 02:58 AM       Ketone  15 (A)  12/09/2019 02:58 AM       Bilirubin  Negative  12/09/2019 02:58 AM       Urobilinogen  1.0  12/09/2019 02:58 AM       Nitrites  Negative  12/09/2019 02:58 AM       Leukocyte Esterase  TRACE (A)  12/09/2019 02:58 AM       Epithelial cells  FEW  12/09/2019 02:58 AM       Bacteria  1+ (A)  12/09/2019 02:58 AM       WBC  5-10  12/09/2019 02:58 AM            RBC  0-5  12/09/2019 02:58 AM          Medications Reviewed:          Current Facility-Administered Medications          Medication  Dose  Route  Frequency           ?  [START ON 12/14/2019] furosemide (LASIX) injection 40 mg   40 mg  IntraVENous  DAILY     ?  albuterol (PROVENTIL HFA, VENTOLIN HFA, PROAIR HFA) inhaler 2 Puff   2 Puff  Inhalation  TID RT     ?  remdesivir 100 mg in 0.9% sodium chloride 250 mL IVPB   100 mg   IntraVENous  Q24H     ?  aspirin delayed-release tablet 81 mg   81 mg  Oral  DAILY     ?  lisinopriL (PRINIVIL, ZESTRIL) tablet 10 mg  10 mg  Oral  DAILY     ?  methIMAzole (TAPAZOLE) tablet 5 mg   5 mg  Oral  DAILY     ?  metoprolol tartrate (LOPRESSOR) tablet 50 mg   50 mg  Oral  BID     ?  oxyCODONE IR (ROXICODONE) tablet 5 mg   5 mg  Oral  Q4H PRN     ?  pantoprazole (PROTONIX) tablet 40 mg   40 mg  Oral  DAILY     ?  rosuvastatin (CRESTOR) tablet 20 mg   20 mg  Oral  QHS     ?  triamterene-hydroCHLOROthiazide (MAXZIDE) 37.5-25 mg per tablet 1 Tablet   1 Tablet  Oral  DAILY     ?  sodium chloride (NS) flush 5-40 mL   5-40 mL  IntraVENous  Q8H     ?  sodium chloride (NS) flush 5-40 mL   5-40 mL  IntraVENous  PRN     ?  acetaminophen (TYLENOL) tablet 650 mg   650 mg  Oral  Q6H PRN          Or           ?  acetaminophen (TYLENOL) suppository 650 mg   650 mg  Rectal  Q6H PRN           ?  polyethylene glycol (MIRALAX) packet 17 g   17 g  Oral  DAILY PRN     ?  ondansetron (ZOFRAN ODT) tablet 4 mg   4 mg  Oral  Q8H PRN          Or           ?  ondansetron (ZOFRAN) injection 4 mg   4 mg  IntraVENous  Q6H PRN     ?  insulin lispro (HUMALOG) injection     SubCUTAneous  AC&HS     ?  glucose chewable tablet 16 g   4 Tablet  Oral  PRN     ?  dextrose (D50W) injection syrg 12.5-25 g   12.5-25 g  IntraVENous  PRN     ?  glucagon (GLUCAGEN) injection 1 mg   1 mg  IntraMUSCular  PRN     ?  guaiFENesin-dextromethorphan (ROBITUSSIN DM) 100-10 mg/5 mL syrup 5 mL   5 mL  Oral  Q4H PRN     ?  dexAMETHasone (DECADRON) tablet 6 mg   6 mg  Oral  DAILY     ?  L.acidophilus-paracasei-S.thermophil-bifidobacter (RISAQUAD) 8 billion cell capsule   1 Capsule  Oral  DAILY     ?  enoxaparin (LOVENOX) injection 40 mg   40 mg  SubCUTAneous  Q12H           ?  sodium chloride (NS) flush 5-10 mL   5-10 mL  IntraVENous  PRN     ______________________________________________________________________   EXPECTED LENGTH OF STAY: 5d 9h   ACTUAL LENGTH OF  STAY:          5                 [employer] Zenon MayoM Tomma Ehinger, DO

## 2019-12-13 NOTE — Progress Notes (Signed)
Problem: Risk for Spread of Infection  Goal: Prevent transmission of infectious organism to others  Description: Prevent the transmission of infectious organisms to other patients, staff members, and visitors.  Outcome: Progressing Towards Goal     Problem: Breathing Pattern - Ineffective  Goal: Ability to achieve and maintain a regular respiratory rate  Outcome: Progressing Towards Goal     Problem: Discharge Planning  Goal: *Discharge to safe environment  Outcome: Progressing Towards Goal

## 2019-12-13 NOTE — Progress Notes (Signed)
Physical Therapy 12/13/2019    Orders received, chart reviewed and patient evaluated by physical therapy. Pending progression with skilled acute physical therapy, recommend:  Physical therapy at least 2 days/week in the home     Recommend with nursing OOB to chair 3x/day and use of bariatric bedside commode. Thank you for completing as able in order to maintain patient strength, endurance and independence.     Full evaluation to follow.   Osborne Casco, PT

## 2019-12-14 LAB — GLUCOSE, POC
Glucose (POC): 259 mg/dL — ABNORMAL HIGH (ref 65–117)
Glucose (POC): 196 mg/dL — ABNORMAL HIGH (ref 65–117)
Glucose (POC): 339 mg/dL — ABNORMAL HIGH (ref 65–117)
Glucose (POC): 316 mg/dL — ABNORMAL HIGH (ref 65–117)

## 2019-12-14 LAB — CULTURE, BLOOD
Culture result:: NO GROWTH
Culture result:: NO GROWTH

## 2019-12-14 LAB — POCT GLUCOSE
POC Glucose: 259 mg/dL — ABNORMAL HIGH (ref 65–117)
POC Glucose: 196 mg/dL — ABNORMAL HIGH (ref 65–117)
POC Glucose: 339 mg/dL — ABNORMAL HIGH (ref 65–117)
POC Glucose: 316 mg/dL — ABNORMAL HIGH (ref 65–117)

## 2019-12-14 LAB — CULTURE, BLOOD 1
Culture: NO GROWTH
Culture: NO GROWTH

## 2019-12-14 MED FILL — ROSUVASTATIN 10 MG TAB: 10 mg | ORAL | Qty: 2

## 2019-12-14 MED FILL — RISAQUAD 8 BILLION CELL CAPSULE: 8 billion cell | ORAL | Qty: 1

## 2019-12-14 MED FILL — LISINOPRIL 10 MG TAB: 10 mg | ORAL | Qty: 1

## 2019-12-14 MED FILL — VEKLURY 100 MG INTRAVENOUS POWDER FOR SOLUTION: 100 mg | INTRAVENOUS | Qty: 100

## 2019-12-14 MED FILL — ASPIRIN 81 MG TAB, DELAYED RELEASE: 81 mg | ORAL | Qty: 1

## 2019-12-14 MED FILL — INSULIN LISPRO 100 UNIT/ML INJECTION: 100 unit/mL | SUBCUTANEOUS | Qty: 1

## 2019-12-14 MED FILL — ENOXAPARIN 40 MG/0.4 ML SUB-Q SYRINGE: 40 mg/0.4 mL | SUBCUTANEOUS | Qty: 0.4

## 2019-12-14 MED FILL — METHIMAZOLE 5 MG TAB: 5 mg | ORAL | Qty: 1

## 2019-12-14 MED FILL — FUROSEMIDE 10 MG/ML IJ SOLN: 10 mg/mL | INTRAMUSCULAR | Qty: 4

## 2019-12-14 MED FILL — METOPROLOL TARTRATE 50 MG TAB: 50 mg | ORAL | Qty: 1

## 2019-12-14 MED FILL — TRIAMTERENE-HYDROCHLOROTHIAZIDE 37.5 MG-25 MG TAB: ORAL | Qty: 1

## 2019-12-14 MED FILL — OXYCODONE 5 MG TAB: 5 mg | ORAL | Qty: 1

## 2019-12-14 MED FILL — DEXAMETHASONE 1 MG TAB: 1 mg | ORAL | Qty: 2

## 2019-12-14 MED FILL — PANTOPRAZOLE 40 MG TAB, DELAYED RELEASE: 40 mg | ORAL | Qty: 1

## 2019-12-14 NOTE — Progress Notes (Signed)
Progress Notes by [name], [name], DO at 12/14/19 1647                Author: [name], [name], DO  Service: Hospitalist  Author Type: Physician       Filed: 12/14/19 1649  Date of Service: 12/14/19 1647  Status: Signed          Editor: [name], [name], DO (Physician)                                                                                    Hospitalist Progress Note   Conrad BurlingtonAmber M [name], DO   Answering service: [phone] OR [phone] from in house phone         Date of Service:  12/14/2019   NAME:  Allison MaskerJean C Newman   DOB:  1940-06-27   MRN:  [id number]        Admission Summary:        23F p/w sob, obese hx of COPD      Interval history / Subjective:        Follow up COVID19 PNA. Patient seen and examined. Up in chair. Weaned oxygen to 3 liters and maintains low 90. Will have patient work with PT and OT 10/11 to  determine home needs.           Assessment & Plan:          COVID virus PNA:   COPD exacerbation: 2nd to above.   Acute hypoxic resp failure: 2nd to above   - CTA: no PE, b/l infiltrates. Remdesivir started 10/6   - Continue Steroids while hospitalized   - s/p empiric abx   - Albuterol inh    - Zinc, ascorbic acid.O2   - wean to keep sats >90%. Will need home oxygen 2-3 liters (TBD). Patient agreeable.    - encourage ambulation (PT/OTconsult)    - incentive spirometer   - continue lasix daily       Hypokalemia: repleted    DM2: A1c 6.7, SSI, AccuChecks, monitor   HTN: chronic, stable, Home regimen, PRN BP meds. Monitor   Morbid obesity: BMI 52. 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 TSH 0.27.   UTI: ruled out      Encourage ambulation, incentive spirometer. PT/OT consult to determine home needs      Code status: Full   DVT prophylaxis: lovenox   Care Plan discussed with: Patient/Family and Nurse   Disposition: Pending clinical improvement            Hospital Problems   Date Reviewed:  12/09/2019                      Codes  Class  Noted  POA              * (Principal) Acute respiratory failure with hypoxia (HCC)  ICD-10-CM: J96.01   ICD-9-CM: 518.81    12/08/2019  Yes                         Review  of Systems:     Negative unless stated above        Vital Signs:      Last 24hrs VS reviewed since prior progress note. Most recent are:   Visit Vitals      BP  100/70 (BP 1 Location: Right lower arm, BP Patient Position: Sitting)     Pulse  64     Temp  97.4 ??F (36.3 ??C)     Resp  18     Ht  5\' 4"  (1.626 m)     Wt  138.5 kg (305 lb 5.4 oz)     SpO2  91%        BMI  52.41 kg/m??              Intake/Output Summary (Last 24 hours) at 12/14/2019 1647   Last data filed at 12/14/2019 1449     Gross per 24 hour        Intake  --        Output  1000 ml        Net  -1000 ml              Physical Examination:     Evaluated face to face and examined 12/14/19      General:  Alert, oriented, No acute distress. Morbidly obese.   Resp:  No accessory muscle use, Good AE, no wheezes. no crepitations   Abd:  Soft, non-tender, non-distended, BS+   Extremities:  No cyanosis or clubbing, no significant edema   Neuro:  Grossly normal, no focal neuro deficits, follows commands, speech wnl   Psych:  Good insight, not agitated.        Data Review:      Review and/or order of clinical lab test   Review and/or order of tests in the radiology section of CPT   Review and/or order of tests in the medicine section of CPT     Labs:        No results for input(s): WBC, HGB, HCT, PLT, HGBEXT, HCTEXT, PLTEXT, HGBEXT, HCTEXT, PLTEXT in the last 72 hours.     Recent Labs            12/13/19   0313  12/12/19   0522     NA  134*  135*     K  3.5  3.6     CL  97  99     CO2  31  33*     BUN  38*  34*     CREA  0.82  0.80     GLU  201*  197*         CA  9.1  8.5          Recent Labs            12/13/19   0313  12/12/19   0522     ALT  18  16     AP  48  49     TBILI  0.4  0.4     TP  7.6  7.4     ALB  2.9*  2.7*         GLOB  4.7*  4.7*        No results for input(s):  INR, PTP, APTT, INREXT, INREXT in the last 72 hours.    No results for input(s): FE, TIBC, PSAT, FERR in the last 72 hours.    No results found for: FOL,  RBCF    No results for input(s): PH, PCO2, PO2 in the last 72 hours.   No results for input(s): CPK, CKNDX, TROIQ in the last 72 hours.      No lab exists for component: CPKMB     Lab Results         Component  Value  Date/Time            Cholesterol, total  116  06/08/2017 04:00 AM       HDL Cholesterol  50  06/08/2017 04:00 AM       LDL, calculated  46.2  06/08/2017 04:00 AM       Triglyceride  99  06/08/2017 04:00 AM            CHOL/HDL Ratio  2.3  06/08/2017 04:00 AM          Lab Results         Component  Value  Date/Time            Glucose (POC)  316 (H)  12/14/2019 04:06 PM       Glucose (POC)  339 (H)  12/14/2019 11:10 AM       Glucose (POC)  196 (H)  12/14/2019 06:55 AM       Glucose (POC)  259 (H)  12/13/2019 10:43 PM            Glucose (POC)  258 (H)  12/13/2019 03:59 PM          Lab Results         Component  Value  Date/Time            Color  YELLOW/STRAW  12/09/2019 02:58 AM       Appearance  CLEAR  12/09/2019 02:58 AM       Specific gravity  1.026  12/09/2019 02:58 AM       pH (UA)  5.5  12/09/2019 02:58 AM       Protein  30 (A)  12/09/2019 02:58 AM       Glucose  Negative  12/09/2019 02:58 AM       Ketone  15 (A)  12/09/2019 02:58 AM       Bilirubin  Negative  12/09/2019 02:58 AM       Urobilinogen  1.0  12/09/2019 02:58 AM       Nitrites  Negative  12/09/2019 02:58 AM       Leukocyte Esterase  TRACE (A)  12/09/2019 02:58 AM       Epithelial cells  FEW  12/09/2019 02:58 AM       Bacteria  1+ (A)  12/09/2019 02:58 AM       WBC  5-10  12/09/2019 02:58 AM            RBC  0-5  12/09/2019 02:58 AM          Medications Reviewed:          Current Facility-Administered Medications          Medication  Dose  Route  Frequency           ?  furosemide (LASIX) injection 40 mg   40 mg  IntraVENous  DAILY     ?  albuterol (PROVENTIL HFA, VENTOLIN HFA, PROAIR HFA)  inhaler 2 Puff   2 Puff  Inhalation  TID RT     ?  aspirin delayed-release tablet 81 mg   81 mg  Oral  DAILY     ?  lisinopriL (PRINIVIL, ZESTRIL) tablet 10 mg   10 mg  Oral  DAILY     ?  methIMAzole (TAPAZOLE) tablet 5 mg   5 mg  Oral  DAILY     ?  metoprolol tartrate (LOPRESSOR) tablet 50 mg   50 mg  Oral  BID     ?  oxyCODONE IR (ROXICODONE) tablet 5 mg   5 mg  Oral  Q4H PRN     ?  pantoprazole (PROTONIX) tablet 40 mg   40 mg  Oral  DAILY     ?  rosuvastatin (CRESTOR) tablet 20 mg   20 mg  Oral  QHS     ?  triamterene-hydroCHLOROthiazide (MAXZIDE) 37.5-25 mg per tablet 1 Tablet   1 Tablet  Oral  DAILY     ?  sodium chloride (NS) flush 5-40 mL   5-40 mL  IntraVENous  Q8H     ?  sodium chloride (NS) flush 5-40 mL   5-40 mL  IntraVENous  PRN     ?  acetaminophen (TYLENOL) tablet 650 mg   650 mg  Oral  Q6H PRN          Or           ?  acetaminophen (TYLENOL) suppository 650 mg   650 mg  Rectal  Q6H PRN           ?  polyethylene glycol (MIRALAX) packet 17 g   17 g  Oral  DAILY PRN           ?  ondansetron (ZOFRAN ODT) tablet 4 mg   4 mg  Oral  Q8H PRN          Or           ?  ondansetron (ZOFRAN) injection 4 mg   4 mg  IntraVENous  Q6H PRN     ?  insulin lispro (HUMALOG) injection     SubCUTAneous  AC&HS     ?  glucose chewable tablet 16 g   4 Tablet  Oral  PRN     ?  dextrose (D50W) injection syrg 12.5-25 g   12.5-25 g  IntraVENous  PRN     ?  glucagon (GLUCAGEN) injection 1 mg   1 mg  IntraMUSCular  PRN     ?  guaiFENesin-dextromethorphan (ROBITUSSIN DM) 100-10 mg/5 mL syrup 5 mL   5 mL  Oral  Q4H PRN     ?  dexAMETHasone (DECADRON) tablet 6 mg   6 mg  Oral  DAILY     ?  L.acidophilus-paracasei-S.thermophil-bifidobacter (RISAQUAD) 8 billion cell capsule   1 Capsule  Oral  DAILY     ?  enoxaparin (LOVENOX) injection 40 mg   40 mg  SubCUTAneous  Q12H           ?  sodium chloride (NS) flush 5-10 mL   5-10 mL  IntraVENous  PRN     ______________________________________________________________________   EXPECTED LENGTH  OF STAY: 5d 9h   ACTUAL LENGTH OF STAY:          6                 Bryn Saline Zenon Mayo, DO

## 2019-12-14 NOTE — Progress Notes (Signed)
Problem: Risk for Spread of Infection  Goal: Prevent transmission of infectious organism to others  Description: Prevent the transmission of infectious organisms to other patients, staff members, and visitors.  Outcome: Progressing Towards Goal     Problem: Airway Clearance - Ineffective  Goal: Achieve or maintain patent airway  Outcome: Progressing Towards Goal     Problem: Gas Exchange - Impaired  Goal: Absence of hypoxia  Outcome: Progressing Towards Goal     Problem: Breathing Pattern - Ineffective  Goal: Ability to achieve and maintain a regular respiratory rate  Outcome: Progressing Towards Goal

## 2019-12-14 NOTE — Progress Notes (Signed)
Pt glucose 357. NP messaged and 4 units given at 2215.            Results for Allison Newman, Allison Newman (MRN [id number]) as of 12/14/2019 22:25   Ref. Range 12/14/2019 21:15   GLUCOSE,FAST - POC Latest Ref Range: 65 - 117 mg/dL 419 (H)

## 2019-12-15 LAB — GLUCOSE, POC
Glucose (POC): 357 mg/dL — ABNORMAL HIGH (ref 65–117)
Glucose (POC): 185 mg/dL — ABNORMAL HIGH (ref 65–117)
Glucose (POC): 231 mg/dL — ABNORMAL HIGH (ref 65–117)

## 2019-12-15 LAB — POCT GLUCOSE
POC Glucose: 357 mg/dL — ABNORMAL HIGH (ref 65–117)
POC Glucose: 185 mg/dL — ABNORMAL HIGH (ref 65–117)
POC Glucose: 231 mg/dL — ABNORMAL HIGH (ref 65–117)

## 2019-12-15 MED ORDER — MICONAZOLE NITRATE 2 % TOPICAL POWDER
2 % | Freq: Two times a day (BID) | CUTANEOUS | Status: DC
Start: 2019-12-15 — End: 2019-12-15
  Administered 2019-12-15: 17:00:00 via TOPICAL

## 2019-12-15 MED ORDER — FUROSEMIDE 40 MG TAB
40 mg | ORAL_TABLET | Freq: Every day | ORAL | 0 refills | Status: AC
Start: 2019-12-15 — End: ?

## 2019-12-15 MED ORDER — INSULIN LISPRO 100 UNIT/ML INJECTION
100 unit/mL | Freq: Once | SUBCUTANEOUS | Status: AC
Start: 2019-12-15 — End: 2019-12-14
  Administered 2019-12-15 (×2): via SUBCUTANEOUS

## 2019-12-15 MED FILL — LISINOPRIL 10 MG TAB: 10 mg | ORAL | Qty: 1

## 2019-12-15 MED FILL — ASPIRIN 81 MG TAB, DELAYED RELEASE: 81 mg | ORAL | Qty: 1

## 2019-12-15 MED FILL — INSULIN LISPRO 100 UNIT/ML INJECTION: 100 unit/mL | SUBCUTANEOUS | Qty: 1

## 2019-12-15 MED FILL — FUROSEMIDE 10 MG/ML IJ SOLN: 10 mg/mL | INTRAMUSCULAR | Qty: 4

## 2019-12-15 MED FILL — ROSUVASTATIN 10 MG TAB: 10 mg | ORAL | Qty: 2

## 2019-12-15 MED FILL — TRIAMTERENE-HYDROCHLOROTHIAZIDE 37.5 MG-25 MG TAB: ORAL | Qty: 1

## 2019-12-15 MED FILL — METHIMAZOLE 5 MG TAB: 5 mg | ORAL | Qty: 1

## 2019-12-15 MED FILL — REMEDY PHYTOPLEX ANTIFUNGAL 2 % TOPICAL POWDER: 2 % | CUTANEOUS | Qty: 85

## 2019-12-15 MED FILL — METOPROLOL TARTRATE 50 MG TAB: 50 mg | ORAL | Qty: 1

## 2019-12-15 MED FILL — DEXAMETHASONE 1 MG TAB: 1 mg | ORAL | Qty: 2

## 2019-12-15 MED FILL — ENOXAPARIN 40 MG/0.4 ML SUB-Q SYRINGE: 40 mg/0.4 mL | SUBCUTANEOUS | Qty: 0.4

## 2019-12-15 MED FILL — RISAQUAD 8 BILLION CELL CAPSULE: 8 billion cell | ORAL | Qty: 1

## 2019-12-15 MED FILL — PANTOPRAZOLE 40 MG TAB, DELAYED RELEASE: 40 mg | ORAL | Qty: 1

## 2019-12-15 NOTE — Progress Notes (Signed)
 Problem: Mobility Impaired (Adult and Pediatric)  Goal: *Acute Goals and Plan of Care (Insert Text)  Description: FUNCTIONAL STATUS PRIOR TO ADMISSION: Patient was modified independent using a rolling walker for functional mobility; used office chair on wheels in kitchen.    HOME SUPPORT PRIOR TO ADMISSION: The patient lived with grandson and brother but did not require assist.    Physical Therapy Goals  Initiated 12/13/2019  1.  Patient will move from supine to sit and sit to supine  and scoot up and down in bed with modified independence within 7 day(s).    2.  Patient will transfer from bed to chair and chair to bed with modified independence using the least restrictive device within 7 day(s).  3.  Patient will perform sit to stand with modified independence within 7 day(s).  4.  Patient will ambulate with modified independence for > 50 feet with the least restrictive device within 7 day(s).       Outcome: Progressing Towards Goal     PHYSICAL THERAPY TREATMENT  Patient: Allison Newman (79 y.o. female)  Date: 12/15/2019  Diagnosis: Acute respiratory failure with hypoxia (HCC) [J96.01] Acute respiratory failure with hypoxia [facility])       Precautions: Fall  Chart, physical therapy assessment, plan of care and goals were reviewed.    ASSESSMENT  Patient continues with skilled PT services and is slowly progressing towards goals. Pt is lethargic but eager to get out of bed. Pt noted to be 92% O2 prior to moving, desaturates with transfer to the bedside commode. Pt attempts to void at the [facility] [address] however unable to pass urine despite urge. Pt assisted with hygiene at trunk and back and noted yeast deposits, cleaned as able, notified RN. Pt assisted into standing and can clean front however needs assistance with posterior hygiene. Pt requires sitting rest break x2 after toileting prior to transfer due to increased labor of breathing and decreased O2 levels to 86%. Pt reads are intermittent and may not be accurate, expect  patient may be desatting further however unable to get readings. Pt transfers to chair and admits significant fatigue. Pt assisted further with bathing while attempting to increase reading. Pt sitting in chair 90-92% after and positioned comfortably. Pt is not expected to tolerate home mobility well and will require home O2 at this time for 3-4L depending on progression with gait to DC home with assistance.     Current Level of Function Impacting Discharge (mobility/balance): min A with tasks, CGA with transfers    Other factors to consider for discharge:          PLAN :  Patient continues to benefit from skilled intervention to address the above impairments.  Continue treatment per established plan of care.  to address goals.    Recommendation for discharge: (in order for the patient to meet his/her long term goals)  Physical therapy at least 2 days/week in the home     This discharge recommendation:  Has been made in collaboration with the attending provider and/or case management    IF patient discharges home will need the following DME: portable oxygen and to be determined (TBD)       SUBJECTIVE:   Patient stated "I wanted to go today but that won't happen. I did everything right, how did I catch covid??."    OBJECTIVE DATA SUMMARY:   Critical Behavior:  Neurologic State: Alert  Orientation Level: Oriented X4  Cognition: Appropriate decision making, Appropriate for age attention/concentration, Appropriate safety  awareness  Safety/Judgement: Awareness of environment, Good awareness of safety precautions  Functional Mobility Training:  Bed Mobility:  Rolling: Supervision  Supine to Sit: Supervision     Scooting: Supervision        Transfers:  Sit to Stand: Contact guard assistance  Stand to Sit: Contact guard assistance                             Balance:  Sitting: Intact;Without support  Standing: Impaired;Without support  Standing - Static: Good;Constant support  Standing - Dynamic : Fair;Good;Constant  support  Ambulation/Gait Training:  Distance (ft): 4 Feet (ft)  Assistive Device: Walker, rolling;Gait belt  Ambulation - Level of Assistance: Contact guard assistance (desaturates)        Gait Abnormalities: Antalgic;Decreased step clearance        Base of Support: Widened;Center of gravity altered     Speed/Cadence: Shuffled;Pace decreased (<100 feet/min)  Step Length: Left shortened;Right shortened            Pain Rating:  Knee pain    Activity Tolerance:   Fair and requires rest breaks    After treatment patient left in no apparent distress:   Sitting in chair and Call bell within reach    COMMUNICATION/COLLABORATION:   The patient's plan of care was discussed with: Occupational therapist, Registered nurse, and Case management.     Asberry PARAS Hutcherson, PT   Time Calculation: 54 mins

## 2019-12-15 NOTE — Discharge Summary (Signed)
Discharge Summary by Conrad Burlington, DO at 12/15/19 1821                Author: Conrad Burlington, DO  Service: Hospitalist  Author Type: Physician       Filed: 12/15/19 1829  Date of Service: 12/15/19 1821  Status: Signed          Editor: Conrad Burlington, DO (Physician)                       Discharge Summary           PATIENT ID: Allison Newman   MRN: [id number]    DATE OF BIRTH: [birth year]     DATE OF ADMISSION: 12/08/2019  8:45 PM     DATE OF DISCHARGE: 12/15/2019    PRIMARY CARE PROVIDER: Tyna Jaksch, MD        ATTENDING PHYSICIAN: Conrad Burlington, DO    DISCHARGING PROVIDER: Conrad Burlington, DO     To contact this individual call [phone] and ask the operator to page.  If unavailable ask to be transferred the Adult Hospitalist Department.      CONSULTATIONS: None      PROCEDURES/SURGERIES: * No surgery found *      ADMITTING DIAGNOSES & HOSPITAL COURSE:       79 year old female with past medical history COPD, type 2 diabetes, hypertension, hyperlipidemia, hypothyroidism, presenting to [facility] with shortness of breath.  Per patient, symptoms have been progressively getting worse.  She notified dispatch  health and was found to be hypoxic and have COVID-19.  She was sent to the ED for further evaluation.  Patient admitted for further evaluation.  She was treated with steroids, remdesivir, and Lasix.  Patient improved while hospitalized but was unable  to wean from supplemental oxygen. On day of discharge, patient had minimal desaturations on 3 liters supplemental oxygen but recovered. Patient has support at home and will likely do better with increased activity at home. She is at increased risk for  further debility while hospitalized. She was agreeable to discharge with supplemental oxygen.          CTA chest:   IMPRESSION   1. No acute pulmonary embolism.   2. Mild patchy bilateral ground glass attenuation can be seen with pulmonary   edema or nonspecific infection/inflammation.   3.  Mild mediastinal left hilar lymphadenopathy may be congestive or reactive.        DISCHARGE DIAGNOSES / PLAN:           COVID virus PNA (despite covid vaccine x 2):   COPD exacerbation: 2nd to above.   Acute hypoxic resp failure: 2nd to above   - CTA: no PE, b/l infiltrates. Remdesivir started 10/6   - s/p Steroids while hospitalized   - s/p empiric abx   - s/p remdesivir    - Albuterol inh    - wean to keep sats >90%. Will need home oxygen 3 liters (TBD). Patient agreeable.    - encourage ambulation (PT/OTconsult)    - incentive spirometer   - continue lasix daily - prescribed x 1 week at discharge    ??   Hypokalemia: repleted    DM2: A1c 6.7, SSI, AccuChecks, monitor   HTN: chronic, stable, Home regimen, PRN BP meds. Monitor   Morbid obesity: BMI 52. 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  TSH 0.27.   UTI: ruled out        ADDITIONAL CARE RECOMMENDATIONS:    Continue lasix 40 mg x 1 week       PENDING TEST RESULTS:    At the time of discharge the following test results are still pending: none      FOLLOW UP APPOINTMENTS:       Follow-up Information               Follow up With  Specialties  Details  Why  Contact Info              Tyna Jaksch, MD  Family Medicine    Office nurse will reach out to you with appointment information.   [address] [place] [address]   [phone]                 [facility] HOME CARE  [facility] health PT  [address]   [place] [address]   [phone]              [employer]      Home oxygen  [address]   [place] [address]   [phone]                 EQUIPMENT needed: supplemental oxygen          DISCHARGE MEDICATIONS:     Current Discharge Medication List              START taking these medications          Details        furosemide (Lasix) 40 mg tablet  Take 1 Tablet by mouth daily.   Qty: 7 Tablet, Refills:  0   Start date:  12/16/2019                     CONTINUE these medications which have NOT CHANGED          Details        oxyCODONE (OXYIR) 5 mg capsule  Take 5 mg by mouth every four (4) hours as needed for Pain.               metoprolol tartrate (LOPRESSOR) 50 mg tablet  TAKE 1 TABLET TWICE DAILY   Qty: 180 Tab, Refills:  1          Associated Diagnoses: HTN, goal below 140/90               rosuvastatin (CRESTOR) 20 mg tablet  TAKE 1 TABLET EVERY NIGHT   Qty: 90 Tab, Refills:  1          Associated Diagnoses: Diabetes mellitus without complication (HCC); Hypercholesterolemia; Microalbuminuria;  Elevated BUN               metFORMIN (GLUCOPHAGE) 1,000 mg tablet  TAKE 1 TABLET TWICE DAILY WITH MEALS   Qty: 180 Tab, Refills:  1               Breo Ellipta 200-25 mcg/dose inhaler  INHALE 1 PUFF BY MOUTH DAILY   Qty: 60 Each, Refills:  1          Associated Diagnoses: COPD (chronic obstructive pulmonary disease) with chronic bronchitis (HCC)               methIMAzole (TAPAZOLE) 5 mg tablet  TAKE 1 TABLET EVERY DAY   Qty:  90 Tab, Refills:  0               triamterene-hydroCHLOROthiazide (DYAZIDE) 37.5-25 mg per capsule  TAKE 1 CAPSULE EVERY DAY   Qty: 90 Cap, Refills:  1          Associated Diagnoses: HTN, goal below 140/90               lisinopril (PRINIVIL, ZESTRIL) 10 mg tablet  TAKE 1 TABLET EVERY DAY   Qty: 90 Tab, Refills:  1          Associated Diagnoses: HTN, goal below 140/90               CALCIUM 600 + D tablet  take 1 tablet by mouth twice a day   Qty: 60 Tab, Refills:  11               aspirin delayed-release 81 mg tablet  Take 1 Tab by mouth daily.   Qty: 30 Tab, Refills:  11          Associated Diagnoses: On aspirin at home               pantoprazole (PROTONIX) 40 mg tablet  Take 1 Tab by mouth daily.   Qty: 30 Tab, Refills:  1               nystatin (NYSTOP) powder  Apply to affected area TID PRN   Qty: 120 g, Refills:  3          Associated Diagnoses: Skin candidiasis               glucose blood VI test strips (BLOOD  GLUCOSE TEST) strip  Use BID Dx11.9 TRUEMETRIX   Qty: 100 Strip, Refills:  11          Associated Diagnoses: Type 2 diabetes mellitus without complication, without long-term current  use of insulin (HCC)               !! lancets misc  Use BID. Dx: E11.9   Qty: 100 Each, Refills:  11          Associated Diagnoses: Uncontrolled type 2 diabetes mellitus with hyperglycemia (HCC)               !! FREESTYLE LANCETS 28 gauge misc  Refills: 1               !! - Potential duplicate medications found. Please discuss with provider.                     NOTIFY YOUR PHYSICIAN FOR ANY OF THE FOLLOWING:    Fever over 101 degrees for 24 hours.    Chest pain, shortness of breath, fever, chills, nausea, vomiting, diarrhea, change in mentation, falling, weakness, bleeding. Severe pain or pain not relieved by medications.   Or, any other signs or symptoms that you may have questions about.      DISPOSITION:      x   Home With:     OT    PT    HH    RN                   Long term SNF/Inpatient Rehab       Independent/assisted living          Hospice          Other:           PATIENT CONDITION AT  DISCHARGE:       Functional status         Poor        Deconditioned         x  Independent         Cognition       x   Lucid        Forgetful           Dementia         Catheters/lines (plus indication)         Foley        PICC        PEG         x  None         Code status       x   Full code           DNR         PHYSICAL EXAMINATION AT DISCHARGE:   General: ??Alert, oriented, No acute distress. Morbidly obese.   Resp: ??No accessory muscle use, Good AE, no wheezes. no crepitations, mild crackles at bases   Abd: ??Soft, non-tender, non-distended, BS+   Extremities: ??No cyanosis or clubbing, no significant edema   Neuro: ??Grossly normal, no focal neuro deficits, follows commands, speech wnl   Psych: ??Good insight, not agitated.   ??          CHRONIC MEDICAL DIAGNOSES:      Problem List  as of 12/15/2019  Date Reviewed:  12-26-19                     Codes  Class  Noted - Resolved             * (Principal) Acute respiratory failure with hypoxia [facility])  ICD-10-CM: J96.01   ICD-9-CM: 518.81    12/08/2019 - Present                       Physical deconditioning  ICD-10-CM: R53.81   ICD-9-CM: 799.3    01/15/2018 - Present                       Current use of long term anticoagulation  ICD-10-CM: Z79.01   ICD-9-CM: V58.61    01/15/2018 - Present                       Abnormal nuclear stress test  ICD-10-CM: R94.39   ICD-9-CM: 794.39    07/18/2017 - Present                       Pulmonary embolus (HCC)  ICD-10-CM: I26.99   ICD-9-CM: 415.19    06/08/2017 - Present                       DM2 (diabetes mellitus, type 2) (HCC) (Chronic)  ICD-10-CM: E11.9   ICD-9-CM: 250.00    06/08/2017 - Present                       Hyperthyroidism (Chronic)  ICD-10-CM: E05.90   ICD-9-CM: 242.90    06/08/2017 - Present                       Pernicious anemia  ICD-10-CM: D51.0   ICD-9-CM: 281.0    04/24/2017 - Present  Morbid obesity (HCC)  ICD-10-CM: E66.01   ICD-9-CM: 278.01    04/24/2017 - Present                       COPD (chronic obstructive pulmonary disease) with chronic bronchitis (HCC)  ICD-10-CM: J44.9   ICD-9-CM: 491.20    04/19/2016 - Present                       Chronic kidney disease (CKD), stage II (mild)  ICD-10-CM: N18.2   ICD-9-CM: 585.2    03/02/2016 - Present                       Osteopenia  ICD-10-CM: M85.80   ICD-9-CM: 733.90    05/03/2015 - Present                       ACP (advance care planning)  ICD-10-CM: Z71.89   ICD-9-CM: V65.49    05/03/2015 - Present                       Slow transit constipation  ICD-10-CM: K59.01   ICD-9-CM: 564.01    08/13/2014 - Present                       Chronic pain of left knee  ICD-10-CM: M25.562, G89.29   ICD-9-CM: 719.46, 338.29    08/13/2014 - Present                       Abnormal finding on EKG  ICD-10-CM: R94.31   ICD-9-CM: 794.31    11/17/2013 - Present                       Microalbuminuria  ICD-10-CM: R80.9    ICD-9-CM: 791.0    08/29/2013 - Present                       Umbilical hernia  ICD-10-CM: K42.9   ICD-9-CM: 553.1    05/08/2013 - Present                       Gallbladder polyp  ICD-10-CM: K82.4   ICD-9-CM: 575.6    04/08/2013 - Present                       Chronic right hip pain  ICD-10-CM: M25.551, G89.29   ICD-9-CM: 719.45, 338.29    03/17/2013 - Present                       Rash in adult  ICD-10-CM: R21   ICD-9-CM: 782.1    09/25/2012 - Present                       Lung nodule seen on imaging study  ICD-10-CM: R91.1   ICD-9-CM: 793.11    09/25/2012 - Present                       Thyroid goiter  ICD-10-CM: E04.9   ICD-9-CM: 240.9    09/25/2012 - Present                       Hypercholesterolemia (Chronic)  ICD-10-CM: E78.00   ICD-9-CM: 272.0    05/29/2012 -  Present                       Acid reflux (Chronic)  ICD-10-CM: K21.9   ICD-9-CM: 530.81    01/15/2012 - Present                       Low TSH level (Chronic)  ICD-10-CM: R79.89   ICD-9-CM: 794.5    09/13/2011 - Present                       Essential hypertension  ICD-10-CM: I10   ICD-9-CM: 401.9    08/28/2011 - Present                       Neuropathic arthritis (Chronic)  ICD-10-CM: M14.60   ICD-9-CM: 094.0, 713.5    08/28/2011 - Present                       RESOLVED: Acute hypoxemic respiratory failure (HCC)  ICD-10-CM: J96.01   ICD-9-CM: 518.81    06/08/2017 - 06/11/2017                       RESOLVED: Finger lesion  ICD-10-CM: L98.9   ICD-9-CM: 709.9    03/02/2016 - 09/04/2016                       RESOLVED: 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)  ICD-10-CM: Z61.096, L98.499   ICD-9-CM: 250.80, 707.9    03/02/2016 - 04/24/2017                       RESOLVED: On aspirin at home  ICD-10-CM: Z79.82   ICD-9-CM: V58.66    05/03/2015 - 04/24/2017                       RESOLVED: Encounter for immunization  ICD-10-CM: Z23   ICD-9-CM: V03.89    05/03/2015 - 09/04/2016                       RESOLVED: Need for pneumococcal vaccination  ICD-10-CM: Z23   ICD-9-CM: V03.82     05/03/2015 - 09/04/2016                       RESOLVED: Need for diphtheria-tetanus-pertussis (Tdap) vaccine, adult/adolescent  ICD-10-CM: Z23   ICD-9-CM: V06.1    01/22/2014 - 09/04/2016                       RESOLVED: Chronic pain disorder  ICD-10-CM: G89.4   ICD-9-CM: 338.4    06/04/2013 - 09/04/2016                       RESOLVED: Abdominal pain  ICD-10-CM: R10.9   ICD-9-CM: 789.00    05/08/2013 - 09/04/2016                       RESOLVED: Abdominal pain, chronic, right lower quadrant  ICD-10-CM: R10.31, G89.29   ICD-9-CM: 789.03, 338.29    04/08/2013 - 04/24/2017                       RESOLVED: Acute bronchitis  ICD-10-CM: J20.9   ICD-9-CM: 466.0    12/12/2011 - 09/04/2016  RESOLVED: Elevated BUN  ICD-10-CM: R79.9   ICD-9-CM: 790.6    09/13/2011 - 09/04/2016                       RESOLVED: Abnormal urine finding  ICD-10-CM: R82.90   ICD-9-CM: 791.9    09/13/2011 - 09/04/2016                       RESOLVED: Diabetes mellitus type II, uncontrolled (HCC) (Chronic)  ICD-10-CM: E11.65   ICD-9-CM: 250.02    08/28/2011 - 04/24/2017                          Greater than 30 minutes were spent with the patient on counseling and coordination of care      Signed:    Conrad Burlingtonmber M Javen Ridings, DO   12/15/2019   6:21 PM

## 2019-12-15 NOTE — Progress Notes (Signed)
 NUTRITION  Reason for Assessment: LOS      Recommendations/plan:   Recommend bowel regimen    Insulin adjustments per MD    Continue current diet regimen    Will rescreen per policy     Information obtained from:   Chart, RN  Past Medical History:   Diagnosis Date   . Abdominal pain 05/08/2013   . Abnormal finding on EKG 11/17/2013   . ACP (advance care planning) 05/03/2015   . Acute pulmonary embolism (HCC) 06/08/2017   . Arthritis    . Chronic kidney disease (CKD), 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   . 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   . Chronic pain of left knee 08/13/2014   . Chronic right hip pain 03/17/2013   . 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   . Diabetes (HCC)    . Elevated BUN 09/13/2011   . Finger lesion 03/02/2016   . Gallbladder polyp 04/08/2013   . Gastrointestinal disorder     GERD   . GERD (gastroesophageal reflux disease)    . Hypercholesterolemia    . Hypertension    . 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 Microalbuminuria 08/29/2013   . 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, adult (HCC) 08/28/2011   . 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   . 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   . On aspirin  at home 05/03/2015   . Other unknown and unspecified cause of morbidity or mortality     hx of bronchitis   . Rash, skin 09/25/2012   . 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) 03/02/2016   . Slow transit constipation 08/13/2014   . Umbilical hernia 05/08/2013       Pt screened for LOS.  Chart/labs/meds reviewed.  Admitted with Acute respiratory failure with hypoxia (HCC) [J96.01]  Discussed with RN.  Could d/c today.  Eating fine - just all the wrong things.  No edema, no wounds.  Last BM 10/5.  BG uncontrolled at this time - likely d/t steroids.  HbA1C indicative of good control PTA (6.7%).  No overt, acute nutritional concerns at this time.      Current Nutrition Therapies:  Diet: consistent CHO 45 gm/meal  Supplements: none at this time  Meal intake: No data found.  Supplement intake: No data  found.      Wt hx:  Wt Readings from Last 10 Encounters:   12/15/19 133.4 kg (294 lb 1.5 oz)   04/23/18 144.7 kg (319 lb)   01/15/18 144.7 kg (319 lb)   11/07/17 140.7 kg (310 lb 1.6 oz)   10/12/17 140.2 kg (309 lb)   08/28/17 138.8 kg (306 lb)   07/18/17 141.5 kg (312 lb)   07/18/17 141.5 kg (312 lb)   06/26/17 141.5 kg (312 lb)   06/11/17 139.4 kg (307 lb 5.1 oz)         Nutrition-Related Concerns Identified:  COVID, uncontrolled BG      Recent Labs     12/13/19  0313   GLU 201*   BUN 38*   CREA 0.82   NA 134*   K 3.5   CL 97   CO2 31   CA 9.1       Recent Labs     12/15/19  1133 12/15/19  0644 12/14/19  2115 12/14/19  1606 12/14/19  1110 12/14/19  0655 12/13/19  2243 12/13/19  1559 12/13/19  1109 12/13/19  0704 12/12/19  2258 12/12/19  1643   GLUCPOC 231* 185* 357* 316* 339*  196* 259* 258* 177* 155* 447* 280*       Lab Results   Component Value Date/Time    Hemoglobin A1c 6.7 (H) 12/09/2019 02:59 AM    Hemoglobin A1c 7.7 (H) 06/08/2017 04:05 AM    Hemoglobin A1c 6.3 (H) 08/25/2015 03:55 PM    Hemoglobin A1c (POC) 6.3 10/12/2017 11:26 AM    Hemoglobin A1c, External 6.6 07/29/2014 12:00 AM         Gerard JONELLE Molt, RD  Available via [employer]

## 2019-12-15 NOTE — Progress Notes (Signed)
Attempted to schedule hospital follow up PCP appointment.  Awaiting call back from the office with appointment information.  Pending patient discharge.  Elio Forget, Care Management Specialist.     Received call back from [facility].  Office nurse will contact the patient with appointment information.  Elio Forget, Care Management Specialist.

## 2019-12-15 NOTE — Progress Notes (Signed)
 Problem: Self Care Deficits Care Plan (Adult)  Goal: *Acute Goals and Plan of Care (Insert Text)  Description: FUNCTIONAL STATUS PRIOR TO ADMISSION: Patient was modified independent using a rolling walker for functional mobility; used office chair on wheels in kitchen. Pt reports mod I with ADLs, sits on shower chair to bathe    HOME SUPPORT PRIOR TO ADMISSION: The patient lived with grandson and brother but did not require assist.       Occupational Therapy Goals  Initiated 12/15/2019  1.  Patient will perform standing ADLs at sink with Spo2 >90% with modified independence within 7 day(s).  2.  Patient will perform lower body dressing using compensatory strategies with modified independence within 7 day(s).  3.  Patient will perform bathing with modified independence within 7 day(s).  4.  Patient will perform toilet transfers with modified independence within 7 day(s).  5.  Patient will perform all aspects of toileting with modified independence within 7 day(s).  7.  Patient will utilize energy conservation techniques during functional activities with verbal cues within 7 day(s).    Outcome: Progressing Towards Goal    OCCUPATIONAL THERAPY EVALUATION  Patient: Allison Newman (79 y.o. female)  Date: 12/15/2019  Primary Diagnosis: Acute respiratory failure with hypoxia (HCC) [J96.01]        Precautions:   Fall    ASSESSMENT  Based on the objective data described below, the patient presents with need for supplemental O2, decreased activity tolerance, impaired pulmonary endurance, and decline in functional status for ADLs s/p admission for acute respiratory failure due to COVID-19. Pt in chair upon arrival with nasal cannula removed. Spo2 87% and replaced 3L NC.  Pt with intensive PT session earlier this AM so fatigued. Currently she requires A for toilet transfers, BM hygiene and LB ADLs. If discharging home, recommend HHOT/PT and family support.     Current Level of Function Impacting Discharge (ADLs/self-care):  CGA, RW, 3L, decreased activity tolerance    Functional Outcome Measure:  The patient scored Total: 50/100 on the Barthel Index outcome measure which is indicative of being 50 in basic self-care.       Other factors to consider for discharge: from home     Patient will benefit from skilled therapy intervention to address the above noted impairments.       PLAN :  Recommendations and Planned Interventions: self care training, functional mobility training, therapeutic exercise, balance training, therapeutic activities, endurance activities, and patient education    Frequency/Duration: Patient will be followed by occupational therapy 5 times a week to address goals.    Recommendation for discharge: (in order for the patient to meet his/her long term goals)  Occupational therapy at least 2 days/week in the home AND ensure assist and/or supervision for safety with ADLs    This discharge recommendation:  Has not yet been discussed the attending provider and/or case management    IF patient discharges home will need the following DME: bedside commode and portable oxygen       SUBJECTIVE:   Patient stated "I used to like to cook."    OBJECTIVE DATA SUMMARY:   HISTORY:   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  Chronic pain of left knee 08/13/2014    Chronic right hip pain 03/17/2013    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    Diabetes (HCC)     Elevated BUN 09/13/2011    Finger lesion 03/02/2016    Gallbladder polyp 04/08/2013    Gastrointestinal disorder     GERD    GERD (gastroesophageal reflux disease)     Hypercholesterolemia     Hypertension     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    Microalbuminuria 08/29/2013    Morbid obesity  with BMI of 60.0-69.9, adult (HCC) 08/28/2011    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    On aspirin  at home 05/03/2015    Other unknown and unspecified cause of morbidity or mortality     hx of bronchitis    Rash, skin 09/25/2012    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) 03/02/2016    Slow transit constipation 08/13/2014    Umbilical hernia 05/08/2013     Past Surgical History:   Procedure Laterality Date    COLONOSCOPY N/A 04/23/2018    COLONOSCOPY AND ESOPHAGOGASTRODUODENOSCOPY (EGD) performed by [place] Carlus SQUIBB., MD at [facility] ENDOSCOPY    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    HX ORTHOPAEDIC  2000    screw in foot left    HX OTHER SURGICAL      colonoscopy    HX TUBAL LIGATION         Expanded or extensive additional review of patient history:     Home Situation  Home Environment: Private residence  # Steps to Enter: 0 (back entrance)  One/Two Story Residence: Split level (sleeps in recliner only 3 steps)  # of Interior Steps: 3  Interior Rails: Left  Living Alone: No  Support Systems: Other Family Member(s) (grandson, brother)  Patient Expects to be Discharged to:: House  Current DME Used/Available at Home: Cane, straight, Environmental consultant, rolling, Grab bars (hand held shower)  Tub or Shower Type: Shower (built in Information systems manager)    Hand dominance: Right    EXAMINATION OF PERFORMANCE DEFICITS:  Cognitive/Behavioral Status:                      Skin: intact    Edema: none noted    Hearing:       Vision/Perceptual:                           Acuity: Within Defined Limits         Range of Motion:  B UEs WFL                            Strength:  Generalized weakness                   Coordination:     Fine Motor Skills-Upper: Left Intact;Right Intact    Gross Motor Skills-Upper: Left Intact;Right Intact    Tone & Sensation:  intact                            Balance:  Sitting: Intact;Without support  Standing: Impaired;Without support  Standing - Static: Good;Constant support  Standing -  Dynamic : Fair;Good;Constant support    Functional Mobility and Transfers for ADLs:  Bed Mobility:  Received OOB in chair upon arrival  Transfers:  Sit to Stand: Contact guard assistance  Stand to Sit: Contact guard assistance    ADL Assessment:  Feeding: Independent    Oral Facial Hygiene/Grooming: Setup    Bathing: Moderate assistance    Upper Body Dressing: Minimum assistance    Lower Body Dressing: Moderate assistance    Toileting: Moderate assistance                ADL Intervention and task modifications:                      Functional Measure:    Barthel Index:  Bathing: 0  Bladder: 5  Bowels: 10  Grooming: 5  Dressing: 5  Feeding: 10  Mobility: 0  Stairs: 0  Toilet Use: 5  Transfer (Bed to Chair and Back): 10  Total: 50/100      The Barthel ADL Index: Guidelines  1. The index should be used as a record of what a patient does, not as a record of what a patient could do.  2. The main aim is to establish degree of independence from any help, physical or verbal, however minor and for whatever reason.  3. The need for supervision renders the patient not independent.  4. A patient's performance should be established using the best available evidence. Asking the patient, friends/relatives and nurses are the usual sources, but direct observation and common sense are also important. However direct testing is not needed.  5. Usually the patient's performance over the preceding 24-48 hours is important, but occasionally longer periods will be relevant.  6. Middle categories imply that the patient supplies over 50 per cent of the effort.  7. Use of aids to be independent is allowed.    Score Interpretation (from Sinoff 1997)   80-100 Independent   60-79 Minimally independent   40-59 Partially dependent   20-39 Very dependent   <20 Totally dependent     -Mahoney, F.l., Barthel, D.W. (1965). Functional evaluation: the Barthel Index. [place] [address] [place] (14)2.  -Sinoff, G., Ore, L. (1997). The Barthel activities of daily  living index: self-reporting versus actual performance in the old (> or = 75 years). Journal of American Geriatric Society 45(7), [phone].   -Fleeta cotton [place], J.J.M.F, Orman ROES., Oneita MERYL Sebastian DELORSE. (1999). Measuring the change in disability after inpatient rehabilitation; comparison of the responsiveness of the Barthel Index and Functional Independence Measure. Journal of Neurology, Neurosurgery, and Psychiatry, 66(4), [phone].  Marea Chillington, N.J.A, Scholte op [place],  W.J.M, & Koopmanschap, M.A. (2004) Assessment of post-stroke quality of life in cost-effectiveness studies: The usefulness of the Barthel Index and the EuroQoL-5D. Quality of Life Research, 22, 572-56     Occupational Therapy Evaluation Charge Determination   History Examination Decision-Making   LOW Complexity : Brief history review  MEDIUM Complexity : 3-5 performance deficits relating to physical, cognitive , or psychosocial skils that result in activity limitations and / or participation restrictions MEDIUM Complexity : Patient may present with comorbidities that affect occupational performnce. Miniml to moderate modification of tasks or assistance (eg, physical or verbal ) with assesment(s) is necessary to enable patient to complete evaluation       Based on the above components, the patient evaluation is determined to be of the following complexity level: LOW   Pain Rating:  none    Activity Tolerance:   desaturates with exertion and requires oxygen and requires rest breaks    After treatment patient left in no apparent distress:  Sitting in chair and Call bell within reach    COMMUNICATION/EDUCATION:   The patient's plan of care was discussed with: Physical therapist and Registered nurse.     Patient/family have participated as able in goal setting and plan of care.    This patient's plan of care is appropriate for delegation to OTA.    Thank you for this referral.  Benton DELENA Dadds, OT  Time Calculation: 15 mins

## 2019-12-15 NOTE — Progress Notes (Signed)
Pulse oximetry assessment   80% at rest on room air (if 88% or less, skip next steps)    94% at rest on 3LPM  86% while ambulating on 3LPM (okay w/ MD)

## 2019-12-15 NOTE — Progress Notes (Signed)
TOC:    RUR 17%    Disposition: Home with HH PT and Home Oxygen.  Referrals [facility] to [facility] [employer] and [facility]  for [facility] for oxygen.    Oxygen has been delivered by  [facility] and [facility] accepted for [facility].  AVS updated.    Transportation: Son    Follow up: PCP/Specialist    Primary contact: Ann Lions [phone]    Cheree Ditto, RN/CRM  [phone]

## 2019-12-16 ENCOUNTER — Encounter: Primary: Geriatric Medicine

## 2019-12-16 NOTE — Progress Notes (Signed)
Patient contacted regarding COVID-19 diagnosis. Discussed COVID-19 related testing which was available at this time. Test results were positive. Patient informed of results, if available? yes.     Patient is fully vaccinated against [phone]    Care Transition Nurse contacted the patient by telephone to perform post discharge assessment. Call within 2 business days of discharge: Yes Verified name and DOB with patient as identifiers. Provided introduction to self, and explanation of the CTN/ACM role, and reason for call due to risk factors for infection and/or exposure to COVID-19.     Symptoms reviewed with patient who verbalized the following symptoms: no new symptoms and no worsening symptoms      Due to no new or worsening symptoms encounter was not routed to provider for escalation. Discussed follow-up appointments. If no appointment was previously scheduled, appointment scheduling offered:  no.  [facility] follow up appointment(s):   Future Appointments   Date Time Provider Department Center   12/18/2019  3:00 PM Sharlotte Alamo, PT [facility]     Non-[facility] follow up appointment(s): PCP 10/20    Interventions to address risk factors: Obtained and reviewed discharge summary and/or continuity of care documents     Advance Care Planning:   Does patient have an Advance Directive: patient declined education.     Educated patient about risk for severe COVID-19 due to risk factors according to CDC guidelines. CTN reviewed discharge instructions, medical action plan and red flag symptoms with the patient who verbalized understanding. Discussed COVID vaccination status: yes. Education provided on COVID-19 vaccination as appropriate. Discussed exposure protocols and quarantine with CDC Guidelines. Patient was given an opportunity to verbalize any questions and concerns and agrees to contact CTN or health care provider for questions related to their healthcare.    Reviewed and educated patient on any new and changed  medications related to discharge diagnosis     Was patient discharged with a pulse oximeter? no     CTN provided contact information. Plan for follow-up call in 5-7 days based on severity of symptoms and risk factors.

## 2019-12-18 ENCOUNTER — Encounter: Admit: 2019-12-18 | Discharge: 2019-12-18 | Primary: Geriatric Medicine

## 2019-12-23 NOTE — Progress Notes (Signed)
Patient contacted regarding COVID-19 diagnosis. Discussed COVID-19 related testing which was available at this time. Test results were positive. Patient informed of results, if available? yes      Care Transition Nurse contacted the patient by telephone to perform follow-up assessment. Verified name and DOB with patient as identifiers. Patient has following risk factors of: COPD.      Symptoms reviewed with patient who verbalized the following symptoms: Patient reports all symptoms have resolved..  Due to no new or worsening symptoms encounter was not routed to provider for escalation.    Interventions to address risk factors: Obtained and reviewed discharge summary and/or continuity of care documents    Educated patient about risk for severe COVID-19 due to risk factors according to CDC guidelines. CTN reviewed discharge instructions, medical action plan and red flag symptoms with the patient who verbalized understanding. Discussed COVID vaccination status: yes. Education provided on COVID-19 vaccination as appropriate. Discussed exposure protocols and quarantine with CDC Guidelines. Patient was given an opportunity to verbalize any questions and concerns and agrees to contact CTN or health care provider for questions related to their healthcare.    Reviewed and educated patient on any new and changed medications related to discharge diagnosis     Was patient discharged with a pulse oximeter? yes Discussed and confirmed pulse oximeter discharge instructions and when to notify provider or seek emergency care.    CTN provided contact information. Plan for follow-up call in 5-7 days based on severity of symptoms and risk factors.

## 2019-12-25 DIAGNOSIS — R200 Anesthesia of skin: Secondary | ICD-10-CM | POA: Diagnosis not present

## 2019-12-25 DIAGNOSIS — M1811 Unilateral primary osteoarthritis of first carpometacarpal joint, right hand: Secondary | ICD-10-CM | POA: Diagnosis not present

## 2020-01-02 DIAGNOSIS — I10 Essential (primary) hypertension: Secondary | ICD-10-CM | POA: Diagnosis not present

## 2020-01-07 ENCOUNTER — Ambulatory Visit
Admission: RE | Admit: 2020-01-07 | Discharge: 2020-01-07 | Disposition: A | Discharge status: Home / Self Care | Payer: Medicare HMO | Source: Ambulatory Visit | Attending: Internal Medicine | Admitting: Internal Medicine

## 2020-01-07 ENCOUNTER — Other Ambulatory Visit: Payer: Self-pay

## 2020-01-07 DIAGNOSIS — Z1231 Encounter for screening mammogram for malignant neoplasm of breast: Secondary | ICD-10-CM

## 2020-01-07 DIAGNOSIS — E2839 Other primary ovarian failure: Secondary | ICD-10-CM

## 2020-01-07 DIAGNOSIS — Z780 Asymptomatic menopausal state: Secondary | ICD-10-CM | POA: Diagnosis not present

## 2020-01-20 DIAGNOSIS — G5601 Carpal tunnel syndrome, right upper limb: Secondary | ICD-10-CM | POA: Diagnosis not present

## 2020-02-02 DIAGNOSIS — I10 Essential (primary) hypertension: Secondary | ICD-10-CM | POA: Diagnosis not present

## 2020-02-03 DIAGNOSIS — I10 Essential (primary) hypertension: Secondary | ICD-10-CM | POA: Diagnosis not present

## 2020-02-12 DIAGNOSIS — H3530 Unspecified macular degeneration: Secondary | ICD-10-CM | POA: Diagnosis not present

## 2020-02-12 DIAGNOSIS — H524 Presbyopia: Secondary | ICD-10-CM | POA: Diagnosis not present

## 2020-02-12 DIAGNOSIS — R69 Illness, unspecified: Secondary | ICD-10-CM | POA: Diagnosis not present

## 2020-02-12 DIAGNOSIS — H353190 Nonexudative age-related macular degeneration, unspecified eye, stage unspecified: Secondary | ICD-10-CM | POA: Diagnosis not present

## 2020-02-12 DIAGNOSIS — H52223 Regular astigmatism, bilateral: Secondary | ICD-10-CM | POA: Diagnosis not present

## 2020-02-12 DIAGNOSIS — H5203 Hypermetropia, bilateral: Secondary | ICD-10-CM | POA: Diagnosis not present

## 2020-02-17 DIAGNOSIS — G5601 Carpal tunnel syndrome, right upper limb: Secondary | ICD-10-CM | POA: Diagnosis not present

## 2020-03-02 DIAGNOSIS — I10 Essential (primary) hypertension: Secondary | ICD-10-CM | POA: Diagnosis not present

## 2020-03-02 DIAGNOSIS — K219 Gastro-esophageal reflux disease without esophagitis: Secondary | ICD-10-CM | POA: Diagnosis not present

## 2020-03-02 DIAGNOSIS — E7800 Pure hypercholesterolemia, unspecified: Secondary | ICD-10-CM | POA: Diagnosis not present

## 2020-03-02 DIAGNOSIS — M179 Osteoarthritis of knee, unspecified: Secondary | ICD-10-CM | POA: Diagnosis not present

## 2020-03-03 DIAGNOSIS — I10 Essential (primary) hypertension: Secondary | ICD-10-CM | POA: Diagnosis not present

## 2020-03-04 DIAGNOSIS — I10 Essential (primary) hypertension: Secondary | ICD-10-CM | POA: Diagnosis not present

## 2020-03-13 DIAGNOSIS — Z03818 Encounter for observation for suspected exposure to other biological agents ruled out: Secondary | ICD-10-CM | POA: Diagnosis not present

## 2020-03-30 DIAGNOSIS — G5601 Carpal tunnel syndrome, right upper limb: Secondary | ICD-10-CM | POA: Diagnosis not present

## 2020-04-04 DIAGNOSIS — I10 Essential (primary) hypertension: Secondary | ICD-10-CM | POA: Diagnosis not present

## 2020-04-05 DIAGNOSIS — M545 Low back pain, unspecified: Secondary | ICD-10-CM | POA: Diagnosis not present

## 2020-04-05 DIAGNOSIS — I10 Essential (primary) hypertension: Secondary | ICD-10-CM | POA: Diagnosis not present

## 2020-04-05 DIAGNOSIS — M25552 Pain in left hip: Secondary | ICD-10-CM | POA: Diagnosis not present

## 2020-04-07 DIAGNOSIS — I10 Essential (primary) hypertension: Secondary | ICD-10-CM | POA: Diagnosis not present

## 2020-04-07 DIAGNOSIS — R42 Dizziness and giddiness: Secondary | ICD-10-CM | POA: Diagnosis not present

## 2020-04-28 DIAGNOSIS — R42 Dizziness and giddiness: Secondary | ICD-10-CM | POA: Diagnosis not present

## 2020-04-28 DIAGNOSIS — Z7982 Long term (current) use of aspirin: Secondary | ICD-10-CM | POA: Diagnosis not present

## 2020-04-28 DIAGNOSIS — M1990 Unspecified osteoarthritis, unspecified site: Secondary | ICD-10-CM | POA: Diagnosis not present

## 2020-04-28 DIAGNOSIS — E669 Obesity, unspecified: Secondary | ICD-10-CM | POA: Diagnosis not present

## 2020-04-28 DIAGNOSIS — Z6834 Body mass index (BMI) 34.0-34.9, adult: Secondary | ICD-10-CM | POA: Diagnosis not present

## 2020-04-28 DIAGNOSIS — Z008 Encounter for other general examination: Secondary | ICD-10-CM | POA: Diagnosis not present

## 2020-04-28 DIAGNOSIS — I10 Essential (primary) hypertension: Secondary | ICD-10-CM | POA: Diagnosis not present

## 2020-04-28 DIAGNOSIS — E785 Hyperlipidemia, unspecified: Secondary | ICD-10-CM | POA: Diagnosis not present

## 2020-04-28 DIAGNOSIS — K219 Gastro-esophageal reflux disease without esophagitis: Secondary | ICD-10-CM | POA: Diagnosis not present

## 2020-04-28 DIAGNOSIS — G8929 Other chronic pain: Secondary | ICD-10-CM | POA: Diagnosis not present

## 2020-04-29 ENCOUNTER — Ambulatory Visit: Payer: Medicare HMO

## 2020-05-03 DIAGNOSIS — I10 Essential (primary) hypertension: Secondary | ICD-10-CM | POA: Diagnosis not present

## 2020-05-04 ENCOUNTER — Encounter: Payer: Self-pay | Admitting: Physical Therapy

## 2020-05-04 ENCOUNTER — Ambulatory Visit: Payer: Medicare HMO | Attending: Internal Medicine | Admitting: Physical Therapy

## 2020-05-04 ENCOUNTER — Other Ambulatory Visit: Payer: Self-pay

## 2020-05-04 DIAGNOSIS — R2681 Unsteadiness on feet: Secondary | ICD-10-CM | POA: Diagnosis not present

## 2020-05-04 DIAGNOSIS — R42 Dizziness and giddiness: Secondary | ICD-10-CM | POA: Diagnosis not present

## 2020-05-04 DIAGNOSIS — R262 Difficulty in walking, not elsewhere classified: Secondary | ICD-10-CM | POA: Insufficient documentation

## 2020-05-04 NOTE — Therapy (Signed)
[facility] [address] [place], [place], [address] Phone: [phone]   Fax:  [phone]  Physical Therapy Evaluation  Patient Details  Name: Margaret Gordon MRN: [id number] Date of Birth: [birth year] Referring Provider (PT): Renford Dills, MD   Encounter Date: 05/04/2020   PT End of Session - 05/04/20 1639    Visit Number 1    Number of Visits 7    Date for PT Re-Evaluation 06/18/20    Authorization Type [employer] Medicare; $35 copay    Progress Note Due on Visit 10    PT Start Time 0931    PT Stop Time 1015    PT Time Calculation (min) 44 min    Activity Tolerance Patient tolerated treatment well    Behavior During Therapy [facility] for tasks assessed/performed           Past Medical History:  Diagnosis Date  . Anemia    hx  . Arthritis   . GERD (gastroesophageal reflux disease)   . History of hiatal hernia   . Hyperlipidemia   . Hypertension   . Pneumonia    hx    Past Surgical History:  Procedure Laterality Date  . LUMBAR LAMINECTOMY/DECOMPRESSION MICRODISCECTOMY Left 11/02/2016   Procedure: Left Lumbar Three-Four, Lumbar Four-Five Laminectomy and Foraminotomy;  Surgeon: Tia Alert, MD;  Location: [facility] OR;  Service: Neurosurgery;  Laterality: Left;  . TONSILLECTOMY    . VESICOVAGINAL FISTULA CLOSURE W/ TAH      There were no vitals filed for this visit.    Subjective Assessment - 05/04/20 0938    Subjective Dizziness began about 2 months ago; felt the spinning begin while driving.  Had have husband's assistance to get into her house due to significant imbalance.  Episode lasted about 3 days.  Pt did not go to the hospital or have imaging.  Now having more symptoms of imbalance.  Denies changes in vision or hearing; denies changes in speech or strength; no changes in sensation.  Pt is back to driving.    Pertinent History anemia, OA, GERD, hiatal hernia, HLD, HTN, PNA, L lumbar laminectomy/microdiscetomy     Diagnostic tests None    Patient Stated Goals To feel less dizzy and off balance    Currently in Pain? Yes   L hip and knee OA             [facility] PT Assessment - 05/04/20 0948      Assessment   Medical Diagnosis Dizziness    Referring Provider (PT) Renford Dills, MD    Onset Date/Surgical Date 04/26/20    Prior Therapy Vertigo years ago      Precautions   Precautions Other (comment)    Precaution Comments anemia, OA, GERD, hiatal hernia, HLD, HTN, PNA, L lumbar laminectomy/microdiscetomy      Balance Screen   Has the patient fallen in the past 6 months No      Home Environment   Living Environment Private residence    Living Arrangements Spouse/significant other      Prior Function   Level of Independence Independent    Vocation Retired;Volunteer work    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[employer], 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[employer]; matron to the [employer]      Observation/Other [employer] on Therapeutic Outcomes (FOTO)  Dizziness Positional Status: 52% and Dizziness Functional Status: 52.2%      Sensation   Light Touch Appears Intact      Coordination   Gross  Motor Movements are Fluid and Coordinated Yes    Fine Motor Movements are Fluid and Coordinated Yes    Finger Nose [facility]inger Test [facility]    Heel Shin Test [facility] but limited on L due to hip pain                  Vestibular Assessment - 05/04/20 0952      Symptom Behavior   Type of Dizziness  Spinning;Imbalance    Frequency of Dizziness Daily    Duration of Dizziness seconds - minute    Symptom Nature Motion provoked;Spontaneous    Aggravating Factors Spontaneous onset    Relieving Factors Medication;Slow movements;Rest    Progression of Symptoms Better      Oculomotor Exam   Oculomotor Alignment Normal    Spontaneous Absent    Gaze-induced  Absent    Smooth Pursuits Intact    Saccades Slow      Oculomotor Exam-Fixation Suppressed    Left Head Impulse positive    Right Head Impulse  negative      Vestibulo-Ocular Reflex   VOR to Slow Head Movement Normal    VOR Cancellation Normal      Visual Acuity   Static 7    Dynamic 6      Positional Testing   Dix-Hallpike Dix-Hallpike Right;Dix-Hallpike Left    Horizontal Canal Testing Horizontal Canal Right;Horizontal Canal Left      Dix-Hallpike Right   Dix-Hallpike Right Duration 0    Dix-Hallpike Right Symptoms No nystagmus      Dix-Hallpike Left   Dix-Hallpike Left Duration 0    Dix-Hallpike Left Symptoms Right nystagmus      Horizontal Canal Right   Horizontal Canal Right Duration 0    Horizontal Canal Right Symptoms Normal      Horizontal Canal Left   Horizontal Canal Left Duration 0    Horizontal Canal Left Symptoms Normal      Positional Sensitivities   Sit to Supine No dizziness    Supine to Left Side No dizziness    Supine to Right Side No dizziness    Supine to Sitting No dizziness    Right Hallpike No dizziness    Up from Right Hallpike No dizziness    Up from Left Hallpike No dizziness    Nose to Right Knee No dizziness    Right Knee to Sitting No dizziness    Nose to Left Knee No dizziness    Left Knee to Sitting No dizziness    Head Turning x 5 No dizziness    Head Nodding x 5 No dizziness    Pivot Right in Standing No dizziness    Pivot Left in Standing No dizziness    Rolling Right No dizziness    Rolling Left No dizziness              Objective measurements completed on examination: See above findings.               PT Education - 05/04/20 1638    Education Details Clinical findings, vestibular hypofunction vs. BPPV presentation, PT POC and goals, handout discussing neuritis/labyrinthitis    Person(s) Educated Patient    Methods Explanation;Handout    Comprehension Verbalized understanding            PT Short Term Goals - 05/04/20 1648      PT SHORT TERM GOAL #1   Title Pt will participate in assessment of falls risk during gait with FGA  Time 2     Period Weeks    Status New    Target Date 05/18/20      PT SHORT TERM GOAL #2   Title Pt will initiate and demonstrate independence with initial HEP    Time 2    Period Weeks    Status New    Target Date 05/18/20             PT Long Term Goals - 05/04/20 1649      PT LONG TERM GOAL #1   Title Pt will demonstrate independence with final HEP    Time 6    Period Weeks    Status New    Target Date 06/18/20      PT LONG TERM GOAL #2   Title Pt will increase reported DPS to 61% and DFS >/=57% on FOTO    Baseline DPS: 52, DFS: 52.2    Time 6    Period Weeks    Status New    Target Date 06/18/20      PT LONG TERM GOAL #3   Title Pt will demonstrate 4 point increase in FGA to indicate decreased falls risk during community ambulation    Baseline TBD    Time 6    Period Weeks    Status New    Target Date 06/18/20      PT LONG TERM GOAL #4   Title Pt will report no symptoms of disequilibrium 5/7 days a week while performing multiple community activities    Time 6    Period Weeks    Status New    Target Date 06/18/20                  Plan - 05/04/20 1640    Clinical Impression Statement Pt is a 80 year old female referred to Neuro OPPT for evaluation of acute onset of vertigo.  Pt's PMH is significant for the following: anemia, OA, GERD, hiatal hernia, HLD, HTN, PNA, L lumbar laminectomy/microdiscetomy. The following deficits were noted during pt's exam: disequilibrium, was positive for corrective saccade with L head impulse test, non-fatiguing right nystagmus during L dix-hallpike test indicating partially compensated L vestibular hypofunction, and impaired standing balance.  Pt was negative for positional vertigo with dix-hallpike and roll test bilaterally.  Pt would benefit from skilled PT to address these impairments and functional limitations to maximize functional mobility independence and reduce falls risk.    Personal Factors and Comorbidities Comorbidity 3+     Comorbidities anemia, OA, GERD, hiatal hernia, HLD, HTN, PNA, L lumbar laminectomy/microdiscetomy    Examination-Activity Limitations Locomotion Level    Examination-Participation Restrictions Community Activity;Occupation;Driving    Stability/Clinical Decision Making Stable/Uncomplicated    Clinical Decision Making Low    Rehab Potential Excellent    PT Frequency 1x / week    PT Duration 6 weeks    PT Treatment/Interventions ADLs/Self Care Home Management;Canalith Repostioning;Gait training;Functional mobility training;Stair training;Therapeutic activities;Therapeutic exercise;Balance training;Neuromuscular re-education;Patient/family education;Vestibular    PT Next Visit Plan assess FGA and reset goal; initiate HEP focusing on higher level balance, VOR    Consulted and Agree with Plan of Care Patient           Patient will benefit from skilled therapeutic intervention in order to improve the following deficits and impairments:  Decreased balance,Difficulty walking,Dizziness  Visit Diagnosis: Dizziness and giddiness  Unsteadiness on feet  Difficulty in walking, not elsewhere classified     Problem List Patient Active Problem List  Diagnosis Date Noted  . S/P lumbar laminectomy 11/02/2016  . Abnormal CT scan, chest 04/01/2013  . Upper airway cough syndrome 01/05/2013    Dierdre Highman, PT, DPT 05/04/20    4:56 PM    [facility] [address] [place], [place], [address] Phone: [phone]   Fax:  [phone]  Name: Margaret Gordon MRN: [id number] Date of Birth: [birth year]

## 2020-05-05 ENCOUNTER — Encounter

## 2020-05-11 ENCOUNTER — Encounter: Payer: Self-pay | Admitting: Physical Therapy

## 2020-05-11 ENCOUNTER — Ambulatory Visit: Payer: Medicare HMO | Admitting: Physical Therapy

## 2020-05-11 ENCOUNTER — Other Ambulatory Visit: Payer: Self-pay

## 2020-05-11 DIAGNOSIS — R262 Difficulty in walking, not elsewhere classified: Secondary | ICD-10-CM | POA: Diagnosis not present

## 2020-05-11 DIAGNOSIS — R42 Dizziness and giddiness: Secondary | ICD-10-CM

## 2020-05-11 DIAGNOSIS — R2681 Unsteadiness on feet: Secondary | ICD-10-CM | POA: Diagnosis not present

## 2020-05-11 NOTE — Patient Instructions (Signed)
Gaze Stabilization: Standing Feet Together (Compliant Surface)    Feet together on pillow, keeping eyes on target on wall __3__ feet away, tilt head down 15-30 and move head side to side for __60__ seconds, slightly faster speed. Repeat while moving head up and down for __60__ seconds at a slightly faster speed. Do __2__ sessions per day.   Feet Heel-Toe "Tandem"    Standing beside your counter top, one hand on counter top as needed for balance, walk a straight line bringing one foot directly in front of the other, slowly focusing on balance. Repeat for __4__ laps down and back per session. Do __2__ sessions per day.    Feet Partial Heel-Toe (Compliant Surface) Head Motion - Eyes Open    With eyes open, standing on compliant surface: pillow/cushion, right foot partially in front of the other, move head slowly: up and down 10 times and then side to side 10 times.  Switch feet and repeat head turns and head nods, 10 times each focusing on your balance. Repeat 1 times per session. Do 2 sessions per day.

## 2020-05-11 NOTE — Therapy (Signed)
[facility] [address] [place], [place], [address] Phone: [phone]   Fax:  [phone]  Physical Therapy Treatment  Patient Details  Name: Margaret Gordon MRN: [id number] Date of Birth: Nov 19, 1940 Referring Provider (PT): Renford Dills, MD   Encounter Date: 05/11/2020   PT End of Session - 05/11/20 1230    Visit Number 2    Number of Visits 7    Date for PT Re-Evaluation 06/18/20    Authorization Type [employer]; $35 copay    Progress Note Due on Visit 10    PT Start Time 1025    PT Stop Time 1109    PT Time Calculation (min) 44 min    Activity Tolerance Patient tolerated treatment well    Behavior During Therapy [facility] for tasks assessed/performed           Past Medical History:  Diagnosis Date  . Anemia    hx  . Arthritis   . GERD (gastroesophageal reflux disease)   . History of hiatal hernia   . Hyperlipidemia   . Hypertension   . Pneumonia    hx    Past Surgical History:  Procedure Laterality Date  . LUMBAR LAMINECTOMY/DECOMPRESSION MICRODISCECTOMY Left 11/02/2016   Procedure: Left Lumbar Three-Four, Lumbar Four-Five Laminectomy and Foraminotomy;  Surgeon: Tia Alert, MD;  Location: [facility] OR;  Service: Neurosurgery;  Laterality: Left;  . TONSILLECTOMY    . VESICOVAGINAL FISTULA CLOSURE W/ TAH      There were no vitals filed for this visit.   Subjective Assessment - 05/11/20 1028    Subjective Dizziness is not bad; has been staying busy.  No falls.  L knee is still bothering her.    Pertinent History anemia, OA, GERD, hiatal hernia, HLD, HTN, PNA, L lumbar laminectomy/microdiscetomy    Diagnostic tests None    Patient Stated Goals To feel less dizzy and off balance    Currently in Pain? Yes              [facility] PT Assessment - 05/11/20 0001      Functional Gait  Assessment   Gait assessed  Yes    Gait Level Surface Walks 20 ft in less than 7 sec but greater than 5.5 sec, uses  assistive device, slower speed, mild gait deviations, or deviates 6-10 in outside of the 12 in walkway width.    Change in Gait Speed Able to smoothly change walking speed without loss of balance or gait deviation. Deviate no more than 6 in outside of the 12 in walkway width.    Gait with Horizontal Head Turns Performs head turns smoothly with no change in gait. Deviates no more than 6 in outside 12 in walkway width    Gait with Vertical Head Turns Performs head turns with no change in gait. Deviates no more than 6 in outside 12 in walkway width.    Gait and Pivot Turn Pivot turns safely in greater than 3 sec and stops with no loss of balance, or pivot turns safely within 3 sec and stops with mild imbalance, requires small steps to catch balance.    Step Over Obstacle Is able to step over one shoe box (4.5 in total height) but must slow down and adjust steps to clear box safely. May require verbal cueing.   Limited due to L knee pain   Gait with Narrow Base of Support Ambulates 4-7 steps.    Gait with Eyes Closed Walks 20 ft, uses  assistive device, slower speed, mild gait deviations, deviates 6-10 in outside 12 in walkway width. Ambulates 20 ft in less than 9 sec but greater than 7 sec.    Ambulating Backwards Walks 20 ft, uses assistive device, slower speed, mild gait deviations, deviates 6-10 in outside 12 in walkway width.    Steps Two feet to a stair, must use rail.   Limited due to L knee pain   Total Score 20               Vestibular Assessment - 05/11/20 0001      Balancemaster   Balancemaster Comment 30 sec, compliant, narrow BOS; increased amounts of sway                     Vestibular Treatment/Exercise - 05/11/20 0001      Vestibular Treatment/Exercise   Gaze Exercises X1 Viewing Horizontal;X1 Viewing Vertical      X1 Viewing Horizontal   Foot Position standing narrow BOS, on pillow    Reps 2    Comments 60 sec each; no symptoms      X1 Viewing Vertical    Foot Position standing narrow BOS, one pillow    Reps 2    Comments 60 seconds; no symptoms provoked              Balance Exercises - 05/11/20 0001      Balance Exercises: Standing   Standing Eyes Opened Narrow base of support (BOS);Foam/compliant surface;Head turns   10 reps   Standing Eyes Closed Narrow base of support (BOS);Foam/compliant surface;30 secs    Tandem Stance Eyes open;Foam/compliant surface   head turns, 10 reps   Tandem Gait Forward;Intermittent upper extremity support   4 laps            PT Education - 05/11/20 1228    Education Details habituation and balance exercises, HEP    Person(s) Educated Patient    Methods Explanation;Demonstration;Handout    Comprehension Verbalized understanding            PT Short Term Goals - 05/11/20 1246      PT SHORT TERM GOAL #1   Title Pt will participate in assessment of falls risk during gait with FGA    Time 2    Period Weeks    Status Achieved    Target Date 05/18/20      PT SHORT TERM GOAL #2   Title Pt will initiate and demonstrate independence with initial HEP    Time 2    Period Weeks    Status New    Target Date 05/18/20             PT Long Term Goals - 05/11/20 1250      PT LONG TERM GOAL #1   Title Pt will demonstrate independence with final HEP  (Date for all LTG is 06/18/20)    Time 6    Period Weeks    Status New      PT LONG TERM GOAL #2   Title Pt will increase reported DPS to 61% and DFS >/=57% on FOTO    Baseline DPS: 52, DFS: 52.2    Time 6    Period Weeks    Status New      PT LONG TERM GOAL #3   Title Pt will demonstrate 4 point increase in FGA to indicate decreased falls risk during community ambulation    Baseline 20/30    Time 6  Period Weeks    Status New      PT LONG TERM GOAL #4   Title Pt will report no symptoms of disequilibrium 5/7 days a week while performing multiple community activities    Time 6    Period Weeks    Status New                  Plan - 05/11/20 1231    Clinical Impression Statement Pt reports with an FGA score of 20/30 although her score was affect by her current L knee pain.  Tolerated all balance and habituation exercises well with no onset of symtpoms or dizziness.  L weight shift noted during eyes closed movement, patient unaware of weight shift after discussion.  Stagered stance on pillow was the most challenging, no UE support needed to complete exercise.    Personal Factors and Comorbidities Comorbidity 3+    Comorbidities anemia, OA, GERD, hiatal hernia, HLD, HTN, PNA, L lumbar laminectomy/microdiscetomy    Examination-Activity Limitations Locomotion Level    Examination-Participation Restrictions Community Activity;Occupation;Driving    Stability/Clinical Decision Making Stable/Uncomplicated    Rehab Potential Excellent    PT Frequency 1x / week    PT Duration 6 weeks    PT Treatment/Interventions ADLs/Self Care Home Management;Canalith Repostioning;Gait training;Functional mobility training;Stair training;Therapeutic activities;Therapeutic exercise;Balance training;Neuromuscular re-education;Patient/family education;Vestibular    PT Next Visit Plan check and progress HEP, higher level balance - compliant surface, staggered stance, address L weight shift. VOR with busy background    Consulted and Agree with Plan of Care Patient           Patient will benefit from skilled therapeutic intervention in order to improve the following deficits and impairments:  Decreased balance,Difficulty walking,Dizziness  Visit Diagnosis: Dizziness and giddiness  Unsteadiness on feet  Difficulty in walking, not elsewhere classified     Problem List Patient Active Problem List   Diagnosis Date Noted  . S/P lumbar laminectomy 11/02/2016  . Abnormal CT scan, chest 04/01/2013  . Upper airway cough syndrome 01/05/2013    Brooke Dare, SPT 05/11/2020, 5:15 PM  [facility] [address] [place], [place], [address] Phone: [phone]   Fax:  [phone]  Name: JERILYNN FELDMEIER MRN: [id number] Date of Birth: [birth year]

## 2020-05-17 ENCOUNTER — Inpatient Hospital Stay: Admit: 2020-05-17 | Payer: MEDICARE | Attending: Critical Care Medicine | Primary: Geriatric Medicine

## 2020-05-17 DIAGNOSIS — N281 Cyst of kidney, acquired: Secondary | ICD-10-CM

## 2020-05-18 ENCOUNTER — Other Ambulatory Visit: Payer: Self-pay

## 2020-05-18 ENCOUNTER — Encounter: Payer: Self-pay | Admitting: Physical Therapy

## 2020-05-18 ENCOUNTER — Ambulatory Visit: Payer: Medicare HMO | Admitting: Physical Therapy

## 2020-05-18 DIAGNOSIS — R42 Dizziness and giddiness: Secondary | ICD-10-CM

## 2020-05-18 DIAGNOSIS — R2681 Unsteadiness on feet: Secondary | ICD-10-CM | POA: Diagnosis not present

## 2020-05-18 DIAGNOSIS — R262 Difficulty in walking, not elsewhere classified: Secondary | ICD-10-CM | POA: Diagnosis not present

## 2020-05-18 NOTE — Therapy (Signed)
[facility] [address] [place], [place], [address] Phone: [phone]   Fax:  [phone]  Physical Therapy Treatment  Patient Details  Name: Margaret Gordon MRN: [id number] Date of Birth: [birth year] Referring Provider (PT): Renford Dills, MD   Encounter Date: 05/18/2020   PT End of Session - 05/18/20 0929    Visit Number 3    Number of Visits 7    Date for PT Re-Evaluation 06/18/20    Authorization Type [employer] Medicare; $35 copay    Progress Note Due on Visit 10    PT Start Time 0847    PT Stop Time 0923    PT Time Calculation (min) 36 min    Activity Tolerance Patient tolerated treatment well    Behavior During Therapy [facility] for tasks assessed/performed           Past Medical History:  Diagnosis Date  . Anemia    hx  . Arthritis   . GERD (gastroesophageal reflux disease)   . History of hiatal hernia   . Hyperlipidemia   . Hypertension   . Pneumonia    hx    Past Surgical History:  Procedure Laterality Date  . LUMBAR LAMINECTOMY/DECOMPRESSION MICRODISCECTOMY Left 11/02/2016   Procedure: Left Lumbar Three-Four, Lumbar Four-Five Laminectomy and Foraminotomy;  Surgeon: Tia Alert, MD;  Location: [facility] OR;  Service: Neurosurgery;  Laterality: Left;  . TONSILLECTOMY    . VESICOVAGINAL FISTULA CLOSURE W/ TAH      There were no vitals filed for this visit.   Subjective Assessment - 05/18/20 0850    Subjective L knee is really bothering her today and has been worse this last week.  A friend suggested an over the counter medication that she says she is going to try.  She is also schedule to visit the doctor to assess her knee.  No reports of dizziness.  There are no movements or positions that provoke her dizziness.    Pertinent History anemia, OA, GERD, hiatal hernia, HLD, HTN, PNA, L lumbar laminectomy/microdiscetomy    Diagnostic tests None    Patient Stated Goals To feel less dizzy and off balance     Currently in Pain? Yes    Pain Location Knee    Pain Orientation Left              [facility] PT Assessment - 05/18/20 0001      Observation/Other Assessments   Focus on Therapeutic Outcomes (FOTO)  Dizziness Positional Status: 70% and Dizziness Functional Status: 67.4%                          Vestibular Treatment/Exercise - 05/18/20 0001      Vestibular Treatment/Exercise   Gaze Exercises X1 Viewing Horizontal;X1 Viewing Vertical;X2 Viewing Horizontal;X2 Viewing Vertical;Eye/Head Exercise Horizontal;Eye/Head Exercise Vertical      X1 Viewing Horizontal   Foot Position standing    Reps 2    Comments busy background, x30, x60 sec; no symptoms      X1 Viewing Vertical   Foot Position standing    Reps 2    Comments busy background, x30, x60; no symptoms      X2 Viewing Horizontal   Foot Position sitting, standing    Reps 1     Comments x60 sec each position; pt had difficulty maintaing rhythm; no symptoms      X2 Viewing Vertical   Foot Position sitting, standing    Reps 1  Comments x60 sec each position; pt had difficulty maintaining rhythm - pt moved marked card for pt but still coordination was difficult; no symptoms      Eye/Head Exercise Horizontal   Foot Position walking forwards and backwards    Reps 3    Comments 6 ft distance, looking at x on busy background with head turns; no symptoms      Eye/Head Exercise Vertical   Foot Position walking forward and backwards    Reps 3    Comments 6 ft distance, looking at x on busy background with head turns; no symptoms                 PT Education - 05/18/20 0927    Education Details status for discharge because of inability to reproduce symptoms, daily activities decreasing symptoms, if she has a flare within the next week coming in for another session, improvement in FOTO score    Person(s) Educated Patient    Methods Explanation    Comprehension Verbalized understanding            PT  Short Term Goals - 05/18/20 0940      PT SHORT TERM GOAL #1   Title Pt will participate in assessment of falls risk during gait with FGA    Time 2    Period Weeks    Status Achieved    Target Date 05/18/20      PT SHORT TERM GOAL #2   Title Pt will initiate and demonstrate independence with initial HEP    Baseline unable to provoke symptoms with vestibular exercises    Time 2    Period Weeks    Status Deferred    Target Date 05/18/20             PT Long Term Goals - 05/18/20 0937      PT LONG TERM GOAL #1   Title Pt will demonstrate independence with final HEP  (Date for all LTG is 06/18/20)    Baseline Unable to elicit symptoms with vetibular exercises    Time 6    Period Weeks    Status Deferred      PT LONG TERM GOAL #2   Title Pt will increase reported DPS to 61% and DFS >/=57% on FOTO    Baseline DPS: 70, DFS: 67    Time 6    Period Weeks    Status Achieved      PT LONG TERM GOAL #3   Title Pt will demonstrate 4 point increase in FGA to indicate decreased falls risk during community ambulation    Baseline 20/30; did not perfrom becuase of continuation in L knee pain    Time 6    Period Weeks    Status Deferred      PT LONG TERM GOAL #4   Title Pt will report no symptoms of disequilibrium 5/7 days a week while performing multiple community activities    Time 6    Period Weeks    Status Achieved                 Plan - 05/18/20 0930    Clinical Impression Statement Unable to reproduce symtpoms with any progression of vesitbular exercises.  She is still limited in function and mobility due to L knee pain and onset of back pain during walking.  Discussed potential of discharge if there is no onset of symptoms within the next week since unable to reproduce symtpoms in therapy and no  onset of symtpoms with ADLs.  Pt has made excellent progress meeting two out of four of her LTG.  Other two goals were deferred due to her L knee pain.    Personal Factors and  Comorbidities Comorbidity 3+    Comorbidities anemia, OA, GERD, hiatal hernia, HLD, HTN, PNA, L lumbar laminectomy/microdiscetomy    Examination-Activity Limitations Locomotion Level    Examination-Participation Restrictions Community Activity;Occupation;Driving    Stability/Clinical Decision Making Stable/Uncomplicated    Rehab Potential Excellent    PT Frequency 1x / week    PT Duration 6 weeks    PT Treatment/Interventions ADLs/Self Care Home Management;Canalith Repostioning;Gait training;Functional mobility training;Stair training;Therapeutic activities;Therapeutic exercise;Balance training;Neuromuscular re-education;Patient/family education;Vestibular    PT Next Visit Plan D/C?  Pt may come in after traveling if her symtpoms return, if not she will call to cancel appointment and we will continue with discharge    Consulted and Agree with Plan of Care Patient           Patient will benefit from skilled therapeutic intervention in order to improve the following deficits and impairments:  Decreased balance,Difficulty walking,Dizziness  Visit Diagnosis: Dizziness and giddiness  Unsteadiness on feet     Problem List Patient Active Problem List   Diagnosis Date Noted  . S/P lumbar laminectomy 11/02/2016  . Abnormal CT scan, chest 04/01/2013  . Upper airway cough syndrome 01/05/2013    Brooke Dare, SPT 05/18/2020, 9:44 AM  [facility] [address] [place], [place], [address] Phone: [phone]   Fax:  [phone]  Name: Margaret Gordon MRN: [id number] Date of Birth: [birth year]

## 2020-05-24 ENCOUNTER — Encounter: Payer: Self-pay | Admitting: Physical Therapy

## 2020-05-24 NOTE — Therapy (Signed)
[facility] [address] [place], [place], [address] Phone: [phone]   Fax:  [phone]  Patient Details  Name: NYELLE WOLFSON MRN: [id number] Date of Birth: June 04, 1940 Referring Provider:  No ref. provider found  Encounter Date: 05/24/2020   PHYSICAL THERAPY DISCHARGE SUMMARY  Visits from Start of Care: 3   Current functional level related to goals / functional outcomes: Pt has met all LTG and is no longer experiencing symptoms of dizziness.  Pt has requested to cancel remaining visits due to full resolution of symptoms.   Remaining deficits: None   Education / Equipment: Education regarding indications to return to therapy  Plan: Patient agrees to discharge.  Patient goals were met. Patient is being discharged due to meeting the stated rehab goals.  ?????     Rico Junker, PT, DPT 05/24/20    10:43 AM    [facility] [address] [place], [place], [address] Phone: [phone]   Fax:  [phone]

## 2020-05-25 ENCOUNTER — Ambulatory Visit: Payer: Medicare HMO | Admitting: Physical Therapy

## 2020-06-01 ENCOUNTER — Ambulatory Visit: Payer: Medicare HMO | Admitting: Physical Therapy

## 2020-06-01 DIAGNOSIS — M5416 Radiculopathy, lumbar region: Secondary | ICD-10-CM | POA: Diagnosis not present

## 2020-06-01 DIAGNOSIS — M4316 Spondylolisthesis, lumbar region: Secondary | ICD-10-CM | POA: Diagnosis not present

## 2020-06-02 ENCOUNTER — Inpatient Hospital Stay: Payer: MEDICARE | Attending: Critical Care Medicine | Primary: Geriatric Medicine

## 2020-06-02 DIAGNOSIS — K219 Gastro-esophageal reflux disease without esophagitis: Secondary | ICD-10-CM | POA: Diagnosis not present

## 2020-06-02 DIAGNOSIS — I10 Essential (primary) hypertension: Secondary | ICD-10-CM | POA: Diagnosis not present

## 2020-06-02 DIAGNOSIS — E7800 Pure hypercholesterolemia, unspecified: Secondary | ICD-10-CM | POA: Diagnosis not present

## 2020-06-02 DIAGNOSIS — M179 Osteoarthritis of knee, unspecified: Secondary | ICD-10-CM | POA: Diagnosis not present

## 2020-06-03 DIAGNOSIS — I10 Essential (primary) hypertension: Secondary | ICD-10-CM | POA: Diagnosis not present

## 2020-06-08 ENCOUNTER — Ambulatory Visit: Payer: Medicare HMO | Admitting: Physical Therapy

## 2020-06-15 ENCOUNTER — Ambulatory Visit: Payer: Medicare HMO | Admitting: Physical Therapy

## 2020-06-17 DIAGNOSIS — M25552 Pain in left hip: Secondary | ICD-10-CM | POA: Diagnosis not present

## 2020-06-17 DIAGNOSIS — M545 Low back pain, unspecified: Secondary | ICD-10-CM | POA: Diagnosis not present

## 2020-06-17 DIAGNOSIS — M1612 Unilateral primary osteoarthritis, left hip: Secondary | ICD-10-CM | POA: Diagnosis not present

## 2020-06-17 DIAGNOSIS — M4316 Spondylolisthesis, lumbar region: Secondary | ICD-10-CM | POA: Diagnosis not present

## 2020-06-22 ENCOUNTER — Other Ambulatory Visit: Payer: Self-pay | Admitting: Neurological Surgery

## 2020-06-22 DIAGNOSIS — M4316 Spondylolisthesis, lumbar region: Secondary | ICD-10-CM | POA: Diagnosis not present

## 2020-06-22 DIAGNOSIS — M25562 Pain in left knee: Secondary | ICD-10-CM | POA: Diagnosis not present

## 2020-06-22 DIAGNOSIS — N2889 Other specified disorders of kidney and ureter: Secondary | ICD-10-CM

## 2020-06-23 ENCOUNTER — Ambulatory Visit
Admission: RE | Admit: 2020-06-23 | Discharge: 2020-06-23 | Disposition: A | Discharge status: Home / Self Care | Payer: Medicare HMO | Source: Ambulatory Visit | Attending: Neurological Surgery | Admitting: Neurological Surgery

## 2020-06-23 DIAGNOSIS — N281 Cyst of kidney, acquired: Secondary | ICD-10-CM | POA: Diagnosis not present

## 2020-06-23 DIAGNOSIS — M25562 Pain in left knee: Secondary | ICD-10-CM | POA: Diagnosis not present

## 2020-06-23 DIAGNOSIS — N2889 Other specified disorders of kidney and ureter: Secondary | ICD-10-CM

## 2020-07-07 DIAGNOSIS — M1712 Unilateral primary osteoarthritis, left knee: Secondary | ICD-10-CM | POA: Diagnosis not present

## 2020-07-14 DIAGNOSIS — M179 Osteoarthritis of knee, unspecified: Secondary | ICD-10-CM | POA: Diagnosis not present

## 2020-07-14 DIAGNOSIS — E7800 Pure hypercholesterolemia, unspecified: Secondary | ICD-10-CM | POA: Diagnosis not present

## 2020-07-14 DIAGNOSIS — I10 Essential (primary) hypertension: Secondary | ICD-10-CM | POA: Diagnosis not present

## 2020-07-14 DIAGNOSIS — K219 Gastro-esophageal reflux disease without esophagitis: Secondary | ICD-10-CM | POA: Diagnosis not present

## 2020-07-21 DIAGNOSIS — M1712 Unilateral primary osteoarthritis, left knee: Secondary | ICD-10-CM | POA: Diagnosis not present

## 2020-07-28 DIAGNOSIS — M1712 Unilateral primary osteoarthritis, left knee: Secondary | ICD-10-CM | POA: Diagnosis not present

## 2020-08-04 DIAGNOSIS — M1712 Unilateral primary osteoarthritis, left knee: Secondary | ICD-10-CM | POA: Diagnosis not present

## 2020-08-11 DIAGNOSIS — Z20822 Contact with and (suspected) exposure to covid-19: Secondary | ICD-10-CM | POA: Diagnosis not present

## 2020-08-12 DIAGNOSIS — H52223 Regular astigmatism, bilateral: Secondary | ICD-10-CM | POA: Diagnosis not present

## 2020-08-12 DIAGNOSIS — H524 Presbyopia: Secondary | ICD-10-CM | POA: Diagnosis not present

## 2020-08-12 DIAGNOSIS — H5203 Hypermetropia, bilateral: Secondary | ICD-10-CM | POA: Diagnosis not present

## 2020-08-12 DIAGNOSIS — H353111 Nonexudative age-related macular degeneration, right eye, early dry stage: Secondary | ICD-10-CM | POA: Diagnosis not present

## 2020-08-12 DIAGNOSIS — H3530 Unspecified macular degeneration: Secondary | ICD-10-CM | POA: Diagnosis not present

## 2020-08-12 DIAGNOSIS — H353121 Nonexudative age-related macular degeneration, left eye, early dry stage: Secondary | ICD-10-CM | POA: Diagnosis not present

## 2020-09-01 DIAGNOSIS — U071 COVID-19: Secondary | ICD-10-CM | POA: Diagnosis not present

## 2020-09-01 DIAGNOSIS — Z20822 Contact with and (suspected) exposure to covid-19: Secondary | ICD-10-CM | POA: Diagnosis not present

## 2020-09-12 DIAGNOSIS — U071 COVID-19: Secondary | ICD-10-CM | POA: Diagnosis not present

## 2020-09-14 DIAGNOSIS — U071 COVID-19: Secondary | ICD-10-CM | POA: Diagnosis not present

## 2020-09-15 DIAGNOSIS — K219 Gastro-esophageal reflux disease without esophagitis: Secondary | ICD-10-CM | POA: Diagnosis not present

## 2020-09-15 DIAGNOSIS — I10 Essential (primary) hypertension: Secondary | ICD-10-CM | POA: Diagnosis not present

## 2020-09-21 DIAGNOSIS — U071 COVID-19: Secondary | ICD-10-CM | POA: Diagnosis not present

## 2020-09-25 DIAGNOSIS — Z20822 Contact with and (suspected) exposure to covid-19: Secondary | ICD-10-CM | POA: Diagnosis not present

## 2020-09-25 DIAGNOSIS — U071 COVID-19: Secondary | ICD-10-CM | POA: Diagnosis not present

## 2020-10-08 DIAGNOSIS — E7800 Pure hypercholesterolemia, unspecified: Secondary | ICD-10-CM | POA: Diagnosis not present

## 2020-10-08 DIAGNOSIS — Z23 Encounter for immunization: Secondary | ICD-10-CM | POA: Diagnosis not present

## 2020-10-08 DIAGNOSIS — I10 Essential (primary) hypertension: Secondary | ICD-10-CM | POA: Diagnosis not present

## 2020-10-08 DIAGNOSIS — Z0000 Encounter for general adult medical examination without abnormal findings: Secondary | ICD-10-CM | POA: Diagnosis not present

## 2020-10-08 DIAGNOSIS — Z79899 Other long term (current) drug therapy: Secondary | ICD-10-CM | POA: Diagnosis not present

## 2020-10-08 DIAGNOSIS — M4800 Spinal stenosis, site unspecified: Secondary | ICD-10-CM | POA: Diagnosis not present

## 2020-10-08 DIAGNOSIS — Z1331 Encounter for screening for depression: Secondary | ICD-10-CM | POA: Diagnosis not present

## 2020-10-08 DIAGNOSIS — M179 Osteoarthritis of knee, unspecified: Secondary | ICD-10-CM | POA: Diagnosis not present

## 2020-10-08 DIAGNOSIS — Z1389 Encounter for screening for other disorder: Secondary | ICD-10-CM | POA: Diagnosis not present

## 2020-10-15 DIAGNOSIS — Z0100 Encounter for examination of eyes and vision without abnormal findings: Secondary | ICD-10-CM | POA: Diagnosis not present

## 2020-11-03 DIAGNOSIS — I10 Essential (primary) hypertension: Secondary | ICD-10-CM | POA: Diagnosis not present

## 2020-12-03 DIAGNOSIS — I10 Essential (primary) hypertension: Secondary | ICD-10-CM | POA: Diagnosis not present

## 2020-12-20 DIAGNOSIS — D649 Anemia, unspecified: Secondary | ICD-10-CM | POA: Diagnosis not present

## 2020-12-20 DIAGNOSIS — R6889 Other general symptoms and signs: Secondary | ICD-10-CM | POA: Diagnosis not present

## 2020-12-20 DIAGNOSIS — E8770 Fluid overload, unspecified: Secondary | ICD-10-CM | POA: Diagnosis not present

## 2020-12-20 DIAGNOSIS — R600 Localized edema: Secondary | ICD-10-CM | POA: Diagnosis not present

## 2021-01-03 DIAGNOSIS — I10 Essential (primary) hypertension: Secondary | ICD-10-CM | POA: Diagnosis not present

## 2021-01-18 DIAGNOSIS — E7800 Pure hypercholesterolemia, unspecified: Secondary | ICD-10-CM | POA: Diagnosis not present

## 2021-01-18 DIAGNOSIS — M179 Osteoarthritis of knee, unspecified: Secondary | ICD-10-CM | POA: Diagnosis not present

## 2021-01-18 DIAGNOSIS — I10 Essential (primary) hypertension: Secondary | ICD-10-CM | POA: Diagnosis not present

## 2021-01-18 DIAGNOSIS — K219 Gastro-esophageal reflux disease without esophagitis: Secondary | ICD-10-CM | POA: Diagnosis not present

## 2021-01-31 DIAGNOSIS — M25511 Pain in right shoulder: Secondary | ICD-10-CM | POA: Diagnosis not present

## 2021-01-31 DIAGNOSIS — M542 Cervicalgia: Secondary | ICD-10-CM | POA: Diagnosis not present

## 2021-02-02 DIAGNOSIS — I10 Essential (primary) hypertension: Secondary | ICD-10-CM | POA: Diagnosis not present

## 2021-02-07 ENCOUNTER — Other Ambulatory Visit: Payer: Self-pay | Admitting: Internal Medicine

## 2021-02-07 DIAGNOSIS — Z1231 Encounter for screening mammogram for malignant neoplasm of breast: Secondary | ICD-10-CM

## 2021-02-11 DIAGNOSIS — M179 Osteoarthritis of knee, unspecified: Secondary | ICD-10-CM | POA: Diagnosis not present

## 2021-02-11 DIAGNOSIS — I10 Essential (primary) hypertension: Secondary | ICD-10-CM | POA: Diagnosis not present

## 2021-02-11 DIAGNOSIS — E7800 Pure hypercholesterolemia, unspecified: Secondary | ICD-10-CM | POA: Diagnosis not present

## 2021-02-11 DIAGNOSIS — K219 Gastro-esophageal reflux disease without esophagitis: Secondary | ICD-10-CM | POA: Diagnosis not present

## 2021-02-23 ENCOUNTER — Ambulatory Visit
Admission: RE | Admit: 2021-02-23 | Discharge: 2021-02-23 | Disposition: A | Discharge status: Home / Self Care | Payer: Medicare HMO | Source: Ambulatory Visit | Attending: Internal Medicine | Admitting: Internal Medicine

## 2021-02-23 DIAGNOSIS — Z1231 Encounter for screening mammogram for malignant neoplasm of breast: Secondary | ICD-10-CM

## 2021-03-29 DIAGNOSIS — M9902 Segmental and somatic dysfunction of thoracic region: Secondary | ICD-10-CM | POA: Diagnosis not present

## 2021-03-29 DIAGNOSIS — M9905 Segmental and somatic dysfunction of pelvic region: Secondary | ICD-10-CM | POA: Diagnosis not present

## 2021-03-29 DIAGNOSIS — M5441 Lumbago with sciatica, right side: Secondary | ICD-10-CM | POA: Diagnosis not present

## 2021-03-29 DIAGNOSIS — M5451 Vertebrogenic low back pain: Secondary | ICD-10-CM | POA: Diagnosis not present

## 2021-03-29 DIAGNOSIS — M9903 Segmental and somatic dysfunction of lumbar region: Secondary | ICD-10-CM | POA: Diagnosis not present

## 2021-03-31 ENCOUNTER — Other Ambulatory Visit: Payer: Self-pay | Admitting: Internal Medicine

## 2021-03-31 ENCOUNTER — Ambulatory Visit
Admission: RE | Admit: 2021-03-31 | Discharge: 2021-03-31 | Disposition: A | Discharge status: Home / Self Care | Payer: No Typology Code available for payment source | Source: Ambulatory Visit | Attending: Internal Medicine | Admitting: Internal Medicine

## 2021-03-31 DIAGNOSIS — M5441 Lumbago with sciatica, right side: Secondary | ICD-10-CM

## 2021-03-31 DIAGNOSIS — M47816 Spondylosis without myelopathy or radiculopathy, lumbar region: Secondary | ICD-10-CM | POA: Diagnosis not present

## 2021-03-31 DIAGNOSIS — M545 Low back pain, unspecified: Secondary | ICD-10-CM | POA: Diagnosis not present

## 2021-04-07 ENCOUNTER — Encounter: Payer: Self-pay | Admitting: *Deleted

## 2021-04-07 ENCOUNTER — Encounter: Payer: Self-pay | Admitting: Family Medicine

## 2021-04-07 ENCOUNTER — Other Ambulatory Visit: Payer: Self-pay

## 2021-04-07 ENCOUNTER — Ambulatory Visit (INDEPENDENT_AMBULATORY_CARE_PROVIDER_SITE_OTHER): Payer: No Typology Code available for payment source | Admitting: Family Medicine

## 2021-04-07 VITALS — BP 100/64 | HR 79 | Temp 98.2°F | Ht 67.0 in | Wt 213.1 lb

## 2021-04-07 DIAGNOSIS — Z833 Family history of diabetes mellitus: Secondary | ICD-10-CM

## 2021-04-07 DIAGNOSIS — K219 Gastro-esophageal reflux disease without esophagitis: Secondary | ICD-10-CM | POA: Diagnosis not present

## 2021-04-07 DIAGNOSIS — M5441 Lumbago with sciatica, right side: Secondary | ICD-10-CM | POA: Diagnosis not present

## 2021-04-07 DIAGNOSIS — R058 Other specified cough: Secondary | ICD-10-CM

## 2021-04-07 DIAGNOSIS — M170 Bilateral primary osteoarthritis of knee: Secondary | ICD-10-CM

## 2021-04-07 DIAGNOSIS — G8929 Other chronic pain: Secondary | ICD-10-CM

## 2021-04-07 DIAGNOSIS — E7800 Pure hypercholesterolemia, unspecified: Secondary | ICD-10-CM | POA: Diagnosis not present

## 2021-04-07 DIAGNOSIS — M19011 Primary osteoarthritis, right shoulder: Secondary | ICD-10-CM | POA: Diagnosis not present

## 2021-04-07 DIAGNOSIS — I10 Essential (primary) hypertension: Secondary | ICD-10-CM | POA: Insufficient documentation

## 2021-04-07 DIAGNOSIS — Z1159 Encounter for screening for other viral diseases: Secondary | ICD-10-CM

## 2021-04-07 DIAGNOSIS — E041 Nontoxic single thyroid nodule: Secondary | ICD-10-CM

## 2021-04-07 DIAGNOSIS — E559 Vitamin D deficiency, unspecified: Secondary | ICD-10-CM | POA: Diagnosis not present

## 2021-04-07 DIAGNOSIS — M419 Scoliosis, unspecified: Secondary | ICD-10-CM | POA: Diagnosis not present

## 2021-04-07 DIAGNOSIS — M19019 Primary osteoarthritis, unspecified shoulder: Secondary | ICD-10-CM | POA: Insufficient documentation

## 2021-04-07 DIAGNOSIS — Z860100 Personal history of colon polyps, unspecified: Secondary | ICD-10-CM | POA: Insufficient documentation

## 2021-04-07 DIAGNOSIS — Z862 Personal history of diseases of the blood and blood-forming organs and certain disorders involving the immune mechanism: Secondary | ICD-10-CM

## 2021-04-07 DIAGNOSIS — Z6833 Body mass index (BMI) 33.0-33.9, adult: Secondary | ICD-10-CM

## 2021-04-07 DIAGNOSIS — M19012 Primary osteoarthritis, left shoulder: Secondary | ICD-10-CM

## 2021-04-07 DIAGNOSIS — M179 Osteoarthritis of knee, unspecified: Secondary | ICD-10-CM | POA: Insufficient documentation

## 2021-04-07 LAB — COMPREHENSIVE METABOLIC PANEL
ALT: 11 U/L (ref 0–35)
AST: 16 U/L (ref 0–37)
Albumin: 3.9 g/dL (ref 3.5–5.2)
Alkaline Phosphatase: 93 U/L (ref 39–117)
BUN: 30 mg/dL — ABNORMAL HIGH (ref 6–23)
CO2: 40 mEq/L — ABNORMAL HIGH (ref 19–32)
Calcium: 10 mg/dL (ref 8.4–10.5)
Chloride: 98 mEq/L (ref 96–112)
Creatinine, Ser: 1.45 mg/dL — ABNORMAL HIGH (ref 0.40–1.20)
GFR: 34.5 mL/min — ABNORMAL LOW (ref >60.00)
Glucose, Bld: 104 mg/dL — ABNORMAL HIGH (ref 70–99)
Potassium: 4.3 mEq/L (ref 3.5–5.1)
Sodium: 143 mEq/L (ref 135–145)
Total Bilirubin: 0.9 mg/dL (ref 0.2–1.2)
Total Protein: 7.2 g/dL (ref 6.0–8.3)

## 2021-04-07 LAB — VITAMIN D 25 HYDROXY (VIT D DEFICIENCY, FRACTURES): VITD: 39.31 ng/mL (ref 30.00–100.00)

## 2021-04-07 LAB — IBC + FERRITIN
Ferritin: 367.2 ng/mL — ABNORMAL HIGH (ref 10.0–291.0)
Iron: 83 ug/dL (ref 42–145)
Saturation Ratios: 29.9 % (ref 20.0–50.0)
TIBC: 277.2 ug/dL (ref 250.0–450.0)
Transferrin: 198 mg/dL — ABNORMAL LOW (ref 212.0–360.0)

## 2021-04-07 LAB — CBC WITH DIFFERENTIAL/PLATELET
Basophils Absolute: 0 10*3/uL (ref 0.0–0.1)
Basophils Relative: 0.1 % (ref 0.0–3.0)
Eosinophils Absolute: 0 10*3/uL (ref 0.0–0.7)
Eosinophils Relative: 0.2 % (ref 0.0–5.0)
HCT: 41 % (ref 36.0–46.0)
Hemoglobin: 12.6 g/dL (ref 12.0–15.0)
Lymphocytes Relative: 16.2 % (ref 12.0–46.0)
Lymphs Abs: 2.3 10*3/uL (ref 0.7–4.0)
MCHC: 30.7 g/dL (ref 30.0–36.0)
MCV: 74 fl — ABNORMAL LOW (ref 78.0–100.0)
Monocytes Absolute: 0.9 10*3/uL (ref 0.1–1.0)
Monocytes Relative: 6.8 % (ref 3.0–12.0)
Neutro Abs: 10.6 10*3/uL — ABNORMAL HIGH (ref 1.4–7.7)
Neutrophils Relative %: 76.7 % (ref 43.0–77.0)
Platelets: 203 10*3/uL (ref 150.0–400.0)
RBC: 5.54 Mil/uL — ABNORMAL HIGH (ref 3.87–5.11)
RDW: 15.3 % (ref 11.5–15.5)
WBC: 13.9 10*3/uL — ABNORMAL HIGH (ref 4.0–10.5)

## 2021-04-07 LAB — TSH: TSH: 0.77 u[IU]/mL (ref 0.35–5.50)

## 2021-04-07 LAB — LIPID PANEL
Cholesterol: 166 mg/dL (ref 0–200)
HDL: 67.5 mg/dL (ref >39.00)
LDL Cholesterol: 76 mg/dL (ref 0–99)
NonHDL: 98.28
Total CHOL/HDL Ratio: 2
Triglycerides: 111 mg/dL (ref 0.0–149.0)
VLDL: 22.2 mg/dL (ref 0.0–40.0)

## 2021-04-07 LAB — HEMOGLOBIN A1C: Hgb A1c MFr Bld: 6.1 % (ref 4.6–6.5)

## 2021-04-07 NOTE — Assessment & Plan Note (Signed)
Contributing to chronic back pain.

## 2021-04-07 NOTE — Assessment & Plan Note (Signed)
Restart meloxicam as this was helping with pain and inflammation in multiple areas.  tramadol , OTC meds not helpful.  Consider return to [facility] specialist to determine if  Only option to treat is  Pain management. At next OV we can consider repeat steroid course, referral to PT and possibly medication such as gabapentin.

## 2021-04-07 NOTE — Progress Notes (Signed)
Patient ID: Margaret Gordon, female    DOB: 11-18-1940, 81 y.o.   MRN: [id number]  This visit was conducted in person.  BP 100/64    Pulse 79    Temp 98.2 F (36.8 C) (Temporal)    Ht 5\' 7"  (1.702 m)    Wt 213 lb 2 oz (96.7 kg)    SpO2 100%    BMI 33.38 kg/m    CC:  Chief Complaint  Patient presents with   Establish Care    Subjective:   HPI: Margaret Gordon is a 81 y.o. female presenting on 04/07/2021 for Establish Care   Previous PCP Dr. Nehemiah Settle  Last CPX: 10/2021   She has chronic low back pain, radiated to right leg.  [place] worsening over  last year.  Uses cane for stability.  S/P lumbar laminectomy 2018 Dr. Yetta Barre. Recent 03/31/2021  X-ray: persistent lower lumbar spondylosis, facet hypertrophy, and scoliosis. No fracture.  She  plans on starting water aerobics.   She has chronic pain in right shoulder Dr. Eulah Pont.. improved with injection in 08/2020     Tylenol,  ibuprofen  glucosamine has not helped. Meloxicam did help some with pain.  Tramadol 50 mg one daily did not help.  Hypertension:   On amlodipine 5 mg daily and Lasix  20 mg daily (she is no longer on valsartan/losartan) BP Readings from Last 3 Encounters:  04/07/21 100/64  11/03/16 (!) 143/87  10/30/16 (!) 157/77  Using medication without problems or lightheadedness:  Chest pain with exertion: none Edema:none yes Short of breath: Average home BPs: Other issues:  High cholesterol on  atorvastatin  Relevant past medical, surgical, family and social history reviewed and updated as indicated. Interim medical history since our last visit reviewed. Allergies and medications reviewed and updated. Outpatient Medications Prior to Visit  Medication Sig Dispense Refill   amLODipine (NORVASC) 5 MG tablet Take 5 mg by mouth daily.     APPLE CIDER VINEGAR PO Take 1 each by mouth every morning.     atorvastatin (LIPITOR) 10 MG tablet Take 10 mg by mouth daily.     FIBER ADULT GUMMIES PO Take 1 each by mouth  daily.     furosemide (LASIX) 20 MG tablet Take 20 mg by mouth daily.     Multiple Vitamin (MULTIVITAMIN WITH MINERALS) TABS tablet Take 1 tablet by mouth at bedtime.     pantoprazole (PROTONIX) 40 MG tablet Take 1 tablet (40 mg total) by mouth daily. 30 tablet 5   traMADol (ULTRAM) 50 MG tablet Take 50 mg by mouth daily as needed.     aspirin 81 MG tablet Take 81 mg by mouth daily.     cholecalciferol (VITAMIN D) 1000 UNITS tablet Take 1,000 Units by mouth daily.     famotidine (PEPCID) 20 MG tablet Take 20 mg by mouth at bedtime.     HYDROcodone-acetaminophen (NORCO/VICODIN) 5-325 MG per tablet Take 1 tablet by mouth 2 (two) times daily as needed for moderate pain.      ibuprofen (ADVIL,MOTRIN) 200 MG tablet Take 400-800 mg by mouth 2 (two) times daily as needed.     losartan (COZAAR) 50 MG tablet Take 50 mg by mouth daily.     meloxicam (MOBIC) 15 MG tablet Take 15 mg by mouth daily.  11   methocarbamol (ROBAXIN) 500 MG tablet Take 1 tablet (500 mg total) by mouth every 6 (six) hours as needed for muscle spasms. 30 tablet 0   OVER THE  COUNTER MEDICATION Take 1,300 mg by mouth every 6 (six) hours as needed. Tylenol arthritis     Polyvinyl Alcohol-Povidone (REFRESH OP) Place 1 drop into both eyes 2 (two) times daily as needed (dry eyes).     No facility-administered medications prior to visit.     Per HPI unless specifically indicated in ROS section below Review of Systems  Constitutional:  Negative for fatigue and fever.  HENT:  Negative for congestion.   Eyes:  Negative for pain.  Respiratory:  Negative for cough and shortness of breath.   Cardiovascular:  Negative for chest pain, palpitations and leg swelling.  Gastrointestinal:  Negative for abdominal pain.  Genitourinary:  Negative for dysuria and vaginal bleeding.  Musculoskeletal:  Positive for arthralgias and back pain.  Neurological:  Negative for syncope, light-headedness and headaches.  Psychiatric/Behavioral:  Negative for  dysphoric mood.   Objective:  BP 100/64    Pulse 79    Temp 98.2 F (36.8 C) (Temporal)    Ht 5\' 7"  (1.702 m)    Wt 213 lb 2 oz (96.7 kg)    SpO2 100%    BMI 33.38 kg/m   Wt Readings from Last 3 Encounters:  04/07/21 213 lb 2 oz (96.7 kg)  11/03/16 232 lb (105.2 kg)  10/30/16 232 lb 14.4 oz (105.6 kg)      Physical Exam Constitutional:      General: She is not in acute distress.    Appearance: Normal appearance. She is well-developed. She is obese. She is not ill-appearing or toxic-appearing.  HENT:     Head: Normocephalic.     Right Ear: Hearing, tympanic membrane, ear canal and external ear normal. Tympanic membrane is not erythematous, retracted or bulging.     Left Ear: Hearing, tympanic membrane, ear canal and external ear normal. Tympanic membrane is not erythematous, retracted or bulging.     Nose: No mucosal edema or rhinorrhea.     Right Sinus: No maxillary sinus tenderness or frontal sinus tenderness.     Left Sinus: No maxillary sinus tenderness or frontal sinus tenderness.     Mouth/Throat:     Pharynx: Uvula midline.  Eyes:     General: Lids are normal. Lids are everted, no foreign bodies appreciated.     Conjunctiva/sclera: Conjunctivae normal.     Pupils: Pupils are equal, round, and reactive to light.  Neck:     Thyroid: No thyroid mass or thyromegaly.     Vascular: No carotid bruit.     Trachea: Trachea normal.  Cardiovascular:     Rate and Rhythm: Normal rate and regular rhythm.     Pulses: Normal pulses.     Heart sounds: Normal heart sounds, S1 normal and S2 normal. No murmur heard.   No friction rub. No gallop.  Pulmonary:     Effort: Pulmonary effort is normal. No tachypnea or respiratory distress.     Breath sounds: Normal breath sounds. No decreased breath sounds, wheezing, rhonchi or rales.  Abdominal:     General: Bowel sounds are normal.     Palpations: Abdomen is soft.     Tenderness: There is no abdominal tenderness.  Musculoskeletal:      Cervical back: Normal range of motion and neck supple.  Skin:    General: Skin is warm and dry.     Findings: No rash.  Neurological:     Mental Status: She is alert.  Psychiatric:        Mood and Affect: Mood is not  anxious or depressed.        Speech: Speech normal.        Behavior: Behavior normal. Behavior is cooperative.        Thought Content: Thought content normal.        Judgment: Judgment normal.      Results for orders placed or performed during the hospital encounter of 10/30/16  Surgical pcr screen   Specimen: Nasal Mucosa; Nasal Swab  Result Value Ref Range   MRSA, PCR NEGATIVE NEGATIVE   Staphylococcus aureus NEGATIVE NEGATIVE  Basic metabolic panel  Result Value Ref Range   Sodium 142 135 - 145 mmol/L   Potassium 4.0 3.5 - 5.1 mmol/L   Chloride 106 101 - 111 mmol/L   CO2 30 22 - 32 mmol/L   Glucose, Bld 101 (H) 65 - 99 mg/dL   BUN 21 (H) 6 - 20 mg/dL   Creatinine, Ser 0.96 0.44 - 1.00 mg/dL   Calcium 9.6 8.9 - 10.3 mg/dL   GFR calc non Af Amer 57 (L) >60 mL/min   GFR calc Af Amer >60 >60 mL/min   Anion gap 6 5 - 15  CBC WITH DIFFERENTIAL  Result Value Ref Range   WBC 6.7 4.0 - 10.5 K/uL   RBC 5.13 (H) 3.87 - 5.11 MIL/uL   Hemoglobin 11.7 (L) 12.0 - 15.0 g/dL   HCT 38.9 36.0 - 46.0 %   MCV 75.8 (L) 78.0 - 100.0 fL   MCH 22.8 (L) 26.0 - 34.0 pg   MCHC 30.1 30.0 - 36.0 g/dL   RDW 14.8 11.5 - 15.5 %   Platelets 172 150 - 400 K/uL   Neutrophils Relative % 68 %   Neutro Abs 4.7 1.7 - 7.7 K/uL   Lymphocytes Relative 23 %   Lymphs Abs 1.5 0.7 - 4.0 K/uL   Monocytes Relative 6 %   Monocytes Absolute 0.4 0.1 - 1.0 K/uL   Eosinophils Relative 3 %   Eosinophils Absolute 0.2 0.0 - 0.7 K/uL   Basophils Relative 0 %   Basophils Absolute 0.0 0.0 - 0.1 K/uL  Protime-INR  Result Value Ref Range   Prothrombin Time 13.9 11.4 - 15.2 seconds   INR 1.06   Hemoglobin A1c  Result Value Ref Range   Hgb A1c MFr Bld 5.6 4.8 - 5.6 %   Mean Plasma Glucose 114.02 mg/dL     This visit occurred during the SARS-CoV-2 public health emergency.  Safety protocols were in place, including screening questions prior to the visit, additional usage of staff PPE, and extensive cleaning of exam room while observing appropriate contact time as indicated for disinfecting solutions.   COVID 19 screen:  No recent travel or known exposure to COVID19 The patient denies respiratory symptoms of COVID 19 at this time. The importance of social distancing was discussed today.   Assessment and Plan Problem List Items Addressed This Visit     Body mass index (BMI) of 33.0 to 33.9 in adult   Chronic bilateral low back pain with right-sided sciatica      Restart meloxicam as this was helping with pain and inflammation in multiple areas.  tramadol , OTC meds not helpful.  Consider return to abck specialist to determine if  Only option to treat is  Pain management. At next OV we can consider repeat steroid course, referral to PT and possibly medication such as gabapentin.      GERD (gastroesophageal reflux disease)     Well controlled on protonix.  Relevant Medications   FIBER ADULT GUMMIES PO   High cholesterol    Chronic , due for re-eval on statin.      Relevant Medications   amLODipine (NORVASC) 5 MG tablet   furosemide (LASIX) 20 MG tablet   Other Relevant Orders   Comprehensive metabolic panel   Lipid panel   History of anemia - Primary   Relevant Orders   CBC with Differential/Platelet   IBC + Ferritin   Hypertension    On amlodipine 5 mg daily and Lasix  20 mg daily (she is no longer on valsartan/losartan) Change the lasix ( furosemide) to as needed for swelling.  Follow BP at home every few days.Marland Kitchen goal < 140/90.      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  Start home PT and NSAID (meloxicam) if not improving  consider return to PT.      Osteoarthritis, knee   Scoliosis    Contributing to chronic back pain.      Thyroid nodule     S/P  Aspiration 2014.  Check TSH today.      Relevant Orders   TSH   Upper airway cough syndrome     Resolved on  protonix 40 mg  daily.      Vitamin D deficiency    Due for re-eval.      Relevant Orders   VITAMIN D 25 Hydroxy (Vit-D Deficiency, Fractures)   Other Visit Diagnoses     Need for hepatitis C screening test       Relevant Orders   Hepatitis C antibody   Family history of diabetes mellitus       Relevant Orders   Hemoglobin A1c      Orders Placed This Encounter  Procedures   Comprehensive metabolic panel   VITAMIN D 25 Hydroxy (Vit-D Deficiency, Fractures)   Lipid panel   Hemoglobin A1c   CBC with Differential/Platelet   IBC + Ferritin   TSH   Hepatitis C antibody       Eliezer Lofts, MD

## 2021-04-07 NOTE — Assessment & Plan Note (Signed)
Resolved on  protonix 40 mg  daily.

## 2021-04-07 NOTE — Assessment & Plan Note (Signed)
Due for re-eval. 

## 2021-04-07 NOTE — Patient Instructions (Addendum)
Change the lasix ( furosemide) to as needed for swelling.  Follow BP at home every few days.Marland Kitchen goal < 140/90.  Start meloxicam daily for pain in back and joints.  Start home physical therapy on right shoulder.  Please stop at the lab to have labs drawn.

## 2021-04-07 NOTE — Assessment & Plan Note (Addendum)
S/P  Aspiration 2014.  Check TSH today.

## 2021-04-07 NOTE — Assessment & Plan Note (Signed)
Well controlled on protonix.

## 2021-04-07 NOTE — Assessment & Plan Note (Signed)
On amlodipine 5 mg daily and Lasix  20 mg daily (she is no longer on valsartan/losartan) Change the lasix ( furosemide) to as needed for swelling.  Follow BP at home every few days.Marland Kitchen goal < 140/90.

## 2021-04-07 NOTE — Assessment & Plan Note (Signed)
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  Start home PT and NSAID (meloxicam) if not improving consider return to PT.

## 2021-04-07 NOTE — Assessment & Plan Note (Signed)
Chronic , due for re-eval on statin.

## 2021-04-08 ENCOUNTER — Encounter: Payer: Self-pay | Admitting: Family Medicine

## 2021-04-08 LAB — HEPATITIS C ANTIBODY
Hepatitis C Ab: NONREACTIVE
SIGNAL TO CUT-OFF: <0.02 (ref <1.00)

## 2021-04-12 ENCOUNTER — Telehealth: Payer: Self-pay | Admitting: Family Medicine

## 2021-04-12 DIAGNOSIS — R944 Abnormal results of kidney function studies: Secondary | ICD-10-CM

## 2021-04-12 NOTE — Telephone Encounter (Signed)
-----   Message from Alvina Chou sent at 04/11/2021 12:35 PM EST ----- Regarding: Lab orders for  Wednesday, 2.15.23 Lab orders for f/u labs

## 2021-04-20 ENCOUNTER — Other Ambulatory Visit: Payer: Self-pay

## 2021-04-20 ENCOUNTER — Other Ambulatory Visit (INDEPENDENT_AMBULATORY_CARE_PROVIDER_SITE_OTHER): Payer: No Typology Code available for payment source

## 2021-04-20 DIAGNOSIS — R944 Abnormal results of kidney function studies: Secondary | ICD-10-CM | POA: Diagnosis not present

## 2021-04-20 LAB — BASIC METABOLIC PANEL
BUN: 20 mg/dL (ref 6–23)
CO2: 32 mEq/L (ref 19–32)
Calcium: 9.5 mg/dL (ref 8.4–10.5)
Chloride: 103 mEq/L (ref 96–112)
Creatinine, Ser: 1.05 mg/dL (ref 0.40–1.20)
GFR: 50.81 mL/min — ABNORMAL LOW (ref >60.00)
Glucose, Bld: 81 mg/dL (ref 70–99)
Potassium: 3.9 mEq/L (ref 3.5–5.1)
Sodium: 141 mEq/L (ref 135–145)

## 2021-04-29 ENCOUNTER — Encounter: Payer: Self-pay | Admitting: Family Medicine

## 2021-04-29 NOTE — Telephone Encounter (Signed)
Message also sent to Support Pool to reschedule appointments.

## 2021-05-03 DIAGNOSIS — I10 Essential (primary) hypertension: Secondary | ICD-10-CM | POA: Diagnosis not present

## 2021-05-03 DIAGNOSIS — Z6830 Body mass index (BMI) 30.0-30.9, adult: Secondary | ICD-10-CM | POA: Diagnosis not present

## 2021-05-03 DIAGNOSIS — M4316 Spondylolisthesis, lumbar region: Secondary | ICD-10-CM | POA: Diagnosis not present

## 2021-05-04 ENCOUNTER — Encounter: Payer: Self-pay | Admitting: Physical Therapy

## 2021-05-04 ENCOUNTER — Ambulatory Visit: Payer: No Typology Code available for payment source | Attending: Neurological Surgery | Admitting: Physical Therapy

## 2021-05-04 ENCOUNTER — Other Ambulatory Visit: Payer: Self-pay

## 2021-05-04 DIAGNOSIS — R2681 Unsteadiness on feet: Secondary | ICD-10-CM | POA: Insufficient documentation

## 2021-05-04 DIAGNOSIS — M6281 Muscle weakness (generalized): Secondary | ICD-10-CM | POA: Diagnosis not present

## 2021-05-04 DIAGNOSIS — M545 Low back pain, unspecified: Secondary | ICD-10-CM | POA: Diagnosis not present

## 2021-05-04 DIAGNOSIS — R42 Dizziness and giddiness: Secondary | ICD-10-CM | POA: Insufficient documentation

## 2021-05-04 DIAGNOSIS — R262 Difficulty in walking, not elsewhere classified: Secondary | ICD-10-CM | POA: Insufficient documentation

## 2021-05-04 NOTE — Therapy (Signed)
[facility] [address] [place], [place], [address] Phone: [phone]   Fax:  [phone]  Physical Therapy Evaluation  Patient Details  Name: Margaret Gordon MRN: [id number] Date of Birth: March 25, 1940 Referring Provider (PT): Marikay Alar   Encounter Date: 05/04/2021   PT End of Session - 05/04/21 1825     Visit Number 1    Date for PT Re-Evaluation 06/29/21    PT Start Time 1500    PT Stop Time 1543    PT Time Calculation (min) 43 min    Activity Tolerance Patient tolerated treatment well;Patient limited by pain    Behavior During Therapy [facility] for tasks assessed/performed             Past Medical History:  Diagnosis Date   Anemia    hx   Arthritis    Asthma    GERD (gastroesophageal reflux disease)    History of chicken pox    History of glaucoma    History of hiatal hernia    Hyperlipidemia    Hypertension    Migraines    Pneumonia    hx   Scoliosis     Past Surgical History:  Procedure Laterality Date   LUMBAR LAMINECTOMY/DECOMPRESSION MICRODISCECTOMY Left 11/02/2016   Procedure: Left Lumbar Three-Four, Lumbar Four-Five Laminectomy and Foraminotomy;  Surgeon: Tia Alert, MD;  Location: [facility] OR;  Service: Neurosurgery;  Laterality: Left;   TONSILLECTOMY     VESICOVAGINAL FISTULA CLOSURE W/ TAH      There were no vitals filed for this visit.    Subjective Assessment - 05/04/21 1505     Subjective Patient reports back surgery 3 years ago. Unfortunately her backpain has returned. 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 She is very frustrated due to losing her Independence. She also reports R shoulder pain/frozen shoulder. She received an injection around early Dec. by Dr Eulah Pont. She has not returned since the injection. Her primary Dr Pattricia Boss) gave her some exercises and advized that she may need some therapy for the shoulder, but then her back  flared up.    Patient is accompained by: Family member   husband   Pertinent History She is status post L3-4 L4-5 decompressive hemilaminectomy on the left.    Limitations Lifting;Standing;Walking;House hold activities    How long can you sit comfortably? difficult to rise after.    How long can you walk comfortably? Sometimes it hurts immediatly    Patient Stated Goals Be able to take care of herself and move without hurting.    Currently in Pain? Yes    Pain Score 7     Pain Location Back    Pain Orientation Right;Left;Lower    Pain Descriptors / Indicators Aching;Sharp;Jabbing    Pain Type Chronic pain;Acute pain    Pain Radiating Towards Sometimes into buttocks.    Pain Onset 1 to 4 weeks ago    Pain Frequency Constant    Aggravating Factors  Sometimes walking, sit to standing.    Pain Relieving Factors massage,Tylenol    Effect of Pain on Daily Activities Limits self care and walking.                [facility] PT Assessment - 05/04/21 0001       Assessment   Medical Diagnosis Spondylolysthesis    Referring Provider (PT) Marikay Alar      Balance Screen   Has the patient fallen in the  past 6 months No      Home [employer] information [employer] manager residence    Living Arrangements Spouse/significant other    Available Help at Discharge Family    Type of Home House    Home Access Stairs to enter    Entrance Stairs-Number of Steps 2    Entrance Stairs-Rails None    Home Layout One level    Home Equipment [place] - single point      Prior Function   Level of Independence Independent with household mobility with device   Needs help donning compression socks and removing bra.   Leisure [place], riding recumbent bike.      Cognition   Overall Cognitive Status Within Functional Limits for tasks assessed      Sensation   Light Touch Impaired by gross assessment   Diminished throughout LLE     Posture/Postural Control   Posture Comments forward head, rounded shoulders,  sloped, R elevated, R pelvis elevated.      ROM / Strength   AROM / PROM / Strength AROM;Strength      AROM   AROM Assessment Site Lumbar    Lumbar Flexion touches ankles    Lumbar Extension 50%    Lumbar - Right Side Bend to knee    Lumbar - Left Side Bend just below knee    Lumbar - Right Rotation 25%    Lumbar - Left Rotation 50%      Strength   Overall Strength Comments BLE strength 4/5, except hip extension appears impaired, assessment limited due to pain.      Palpation   Palpation comment B lumbar paraspinals, R gluts, B ITB, all TTP.      Ambulation/Gait   Gait Comments Walks with cane, slow, step to at times, step through at time, unsteady.                        Objective measurements completed on examination: See above findings.                PT Education - 05/04/21 1825     Education Details POC    Person(s) Educated Patient;Spouse    Methods Explanation    Comprehension Verbalized understanding              PT Short Term Goals - 05/04/21 1835       PT SHORT TERM GOAL #1   Title I with basic HEP    Time 4    Period Weeks    Status New    Target Date 06/01/21               PT Long Term Goals - 05/04/21 1836       PT LONG TERM GOAL #1   Title I with final HEP.    Time 8    Period Weeks    Status New    Target Date 06/29/21      PT LONG TERM GOAL #2   Title Increase FOTO score to 51    Baseline 44    Time 8    Period Weeks    Status New    Target Date 06/29/21      PT LONG TERM GOAL #3   Title Patient will demosntrate lumbar ROM of at least 80% of full in all planes.    Baseline 25-50%    Time 8    Period Weeks    Status New  Target Date 06/29/21      PT LONG TERM GOAL #4   Title Patient will report back pain of <3/10 after performing her normal daily activities Independently.    Baseline REquires occasional A from husband with back pain up to 7/10    Time 8    Period Weeks    Status New     Target Date 06/29/21                    Plan - 05/04/21 1826     Clinical Impression Statement Patient referred to PT due to recent exacerbation of chronic back pain. She is also dealing with new onset of R frozen shoulder, being addressed by her Dr at this time, but impeding her self care. 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 She is status post L3-4 L4-5 decompressive hemilaminectomy on the left. She demosntrates impaired sensation on the L, along with lower back muscle spasm, altered posture, decreased ROM in lumbar region, hip weakness. She will benefit from PT to treat her acute apin along iwth stretching and strengthening program to improve her posture and maintain painfree activity.    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 She is status post L3-4 L4-5 decompressive hemilaminectomy on the left.    Examination-Activity Limitations Reach Overhead;Bend;Locomotion Level;Transfers;Hygiene/Grooming;Dressing;Squat;Sleep    Examination-Participation Restrictions Cleaning;Meal Prep;Interpersonal Relationship;Laundry    Stability/Clinical Decision Making Evolving/Moderate complexity    Clinical Decision Making Moderate    Rehab Potential Good    PT Frequency 2x / week    PT Duration 8 weeks    PT Treatment/Interventions ADLs/Self Care Home Management;Iontophoresis 4mg /ml Dexamethasone;Gait training;Stair training;Functional mobility training;Moist Heat;Traction;Therapeutic activities;Therapeutic exercise;Balance training;Neuromuscular re-education;Manual techniques;Dry needling;Passive range of motion;Patient/family education    PT Next Visit Plan HEP    Consulted and Agree with Plan of Care Patient             Patient will benefit from skilled therapeutic intervention in order to improve the following deficits and  impairments:  Abnormal gait, Decreased range of motion, Difficulty walking, Pain, Decreased activity tolerance, Decreased balance, Decreased mobility, Decreased strength, Impaired sensation, Postural dysfunction, Improper body mechanics, Impaired flexibility  Visit Diagnosis: Difficulty in walking, not elsewhere classified  Acute bilateral low back pain without sciatica  Muscle weakness (generalized)  Unsteadiness on feet     Problem List Patient Active Problem List   Diagnosis Date Noted   Personal history of colonic polyps 04/07/2021   Thyroid nodule 04/07/2021   GERD (gastroesophageal reflux disease) 04/07/2021   Chronic bilateral low back pain with right-sided sciatica 04/07/2021   Scoliosis 04/07/2021   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   Osteoarthritis, 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   Hypertension 04/07/2021   High cholesterol 04/07/2021   History of anemia 04/07/2021   Body mass index (BMI) of 33.0 to 33.9 in adult 04/07/2021   Vitamin D deficiency 04/07/2021   OA (osteoarthritis of spine) 01/16/2017   S/P lumbar laminectomy 11/02/2016   Abnormal CT scan, chest 04/01/2013   Upper airway cough syndrome 01/05/2013    13/04/2012, DPT 05/04/2021, 6:44 PM  [facility] [address] [place], [place], [place] Phone: [phone]   Fax:  [phone]  Name: Margaret Gordon MRN: [id number] Date of Birth: 19-Oct-1940

## 2021-05-05 ENCOUNTER — Ambulatory Visit: Payer: No Typology Code available for payment source | Admitting: Family Medicine

## 2021-05-09 ENCOUNTER — Encounter: Payer: Self-pay | Admitting: Physical Therapy

## 2021-05-09 ENCOUNTER — Other Ambulatory Visit: Payer: Self-pay

## 2021-05-09 ENCOUNTER — Ambulatory Visit: Payer: No Typology Code available for payment source | Admitting: Physical Therapy

## 2021-05-09 DIAGNOSIS — M545 Low back pain, unspecified: Secondary | ICD-10-CM

## 2021-05-09 DIAGNOSIS — R42 Dizziness and giddiness: Secondary | ICD-10-CM

## 2021-05-09 DIAGNOSIS — R262 Difficulty in walking, not elsewhere classified: Secondary | ICD-10-CM

## 2021-05-09 DIAGNOSIS — M6281 Muscle weakness (generalized): Secondary | ICD-10-CM

## 2021-05-09 DIAGNOSIS — R2681 Unsteadiness on feet: Secondary | ICD-10-CM

## 2021-05-09 NOTE — Therapy (Signed)
[place] ?[address] ?[place], [place], [address] ?Phone: [phone]   Fax:  [phone] ? ?Physical Therapy Treatment ? ?Patient Details  ?Name: Margaret Gordon ?MRN: [id number] ?Date of Birth: 03-30-1940 ?Referring Provider (PT): Marikay Alar ? ? ?Encounter Date: 05/09/2021 ? ? PT End of Session - 05/09/21 1636   ? ? Visit Number 2   ? Date for PT Re-Evaluation 06/29/21   ? PT Start Time 1545   ? PT Stop Time 1628   ? PT Time Calculation (min) 43 min   ? Activity Tolerance Patient tolerated treatment well;Patient limited by pain   ? Behavior During Therapy [facility] for tasks assessed/performed   ? ?  ?  ? ?  ? ? ?Past Medical History:  ?Diagnosis Date  ? Anemia   ? hx  ? Arthritis   ? Asthma   ? GERD (gastroesophageal reflux disease)   ? History of chicken pox   ? History of glaucoma   ? History of hiatal hernia   ? Hyperlipidemia   ? Hypertension   ? Migraines   ? Pneumonia   ? hx  ? Scoliosis   ? ? ?Past Surgical History:  ?Procedure Laterality Date  ? LUMBAR LAMINECTOMY/DECOMPRESSION MICRODISCECTOMY Left 11/02/2016  ? Procedure: Left Lumbar Three-Four, Lumbar Four-Five Laminectomy and Foraminotomy;  Surgeon: Tia Alert, MD;  Location: [facility] OR;  Service: Neurosurgery;  Laterality: Left;  ? TONSILLECTOMY    ? VESICOVAGINAL FISTULA CLOSURE W/ TAH    ? ? ?There were no vitals filed for this visit. ? ? Subjective Assessment - 05/09/21 1545   ? ? Subjective Patient reports no real changes in her status since evaluation.   ? Pertinent History She is status post L3-4 L4-5 decompressive hemilaminectomy on the left.   ? Currently in Pain? Yes   ? Pain Score 6    ? Pain Location Back   ? Pain Orientation Right;Left;Lower   ? Pain Descriptors / Indicators Aching;Sharp;Jabbing   ? Pain Type Chronic pain   ? Pain Onset 1 to 4 weeks ago   ? Pain Frequency Constant   ? ?  ?  ? ?  ? ? ? ? ? ? ? ? ? ? ? ? ? ? ? ? ? ? ? ? [facility] Adult PT Treatment/Exercise - 05/09/21 0001   ? ?  ?  Exercises  ? Exercises Lumbar   ?  ? Lumbar Exercises: Stretches  ? Active Hamstring Stretch Right;Left;3 reps;10 seconds   ? Passive Hamstring Stretch Limitations Attempted passive stretch with strap, but patient was unable to control due to R shoulder pain, so deferred.   ? Single Knee to Chest Stretch Right;Left;3 reps;10 seconds   ? Double Knee to Chest Stretch 3 reps;10 seconds   ? Pelvic Tilt 3 reps;5 seconds   ? ITB Stretch Right;Left;3 reps;10 seconds   ?  ? Lumbar Exercises: Aerobic  ? Nustep L4 x 5 minutes.   ?  ? Lumbar Exercises: Standing  ? Row Strengthening;Both;10 reps;Theraband   ? Theraband Level (Row) Level 2 (Red)   ? Shoulder Extension Strengthening;Both;10 reps;Theraband   ? Theraband Level (Shoulder Extension) Level 2 (Red)   ? Other Standing Lumbar Exercises B shoulder ER against red Tband resistance, 10 reps.   ?  ? Lumbar Exercises: Supine  ? Bridge Non-compliant;10 reps;1 second   ? Bridge with clamshell Non-compliant;10 reps;1 second   ? ?  ?  ? ?  ? ? ? ? ? ? ? ? ? ?  PT Education - 05/09/21 1636   ? ? Education Details HEP   ? Person(s) Educated Patient   ? Methods Explanation;Demonstration;Handout   ? Comprehension Verbalized understanding;Returned demonstration   ? ?  ?  ? ?  ? ? ? PT Short Term Goals - 05/09/21 1638   ? ?  ? PT SHORT TERM GOAL #1  ? Title I with basic HEP   ? Baseline initiated   ? Time 4   ? Period Weeks   ? Status On-going   ? Target Date 06/01/21   ? ?  ?  ? ?  ? ? ? ? PT Long Term Goals - 05/04/21 1836   ? ?  ? PT LONG TERM GOAL #1  ? Title I with final HEP.   ? Time 8   ? Period Weeks   ? Status New   ? Target Date 06/29/21   ?  ? PT LONG TERM GOAL #2  ? Title Increase FOTO score to 51   ? Baseline 44   ? Time 8   ? Period Weeks   ? Status New   ? Target Date 06/29/21   ?  ? PT LONG TERM GOAL #3  ? Title Patient will demosntrate lumbar ROM of at least 80% of full in all planes.   ? Baseline 25-50%   ? Time 8   ? Period Weeks   ? Status New   ? Target Date 06/29/21    ?  ? PT LONG TERM GOAL #4  ? Title Patient will report back pain of <3/10 after performing her normal daily activities Independently.   ? Baseline REquires occasional A from husband with back pain up to 7/10   ? Time 8   ? Period Weeks   ? Status New   ? Target Date 06/29/21   ? ?  ?  ? ?  ? ? ? ? ? ? ? ? Plan - 05/09/21 1551   ? ? Clinical Impression Statement Patient reports no change in her pain. Her house is hectic right now due to having sold her house unexpectedly quickly. Treatment focused on stretching to lower back and hips followed by initiating lower body strengthening. She performed all exercises without complaint, reports no increase in back pain.   ? Personal Factors and Comorbidities Age;Fitness;Comorbidity 3+   ? 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 She is status post L3-4 L4-5 decompressive hemilaminectomy on the left.   ? Examination-Activity Limitations Reach Overhead;Bend;Locomotion Level;Transfers;Hygiene/Grooming;Dressing;Squat;Sleep   ? Examination-Participation Restrictions Cleaning;Meal Prep;Interpersonal Relationship;Laundry   ? Stability/Clinical Decision Making Evolving/Moderate complexity   ? Clinical Decision Making Moderate   ? Rehab Potential Good   ? PT Frequency 2x / week   ? PT Duration 8 weeks   ? PT Treatment/Interventions ADLs/Self Care Home Management;Iontophoresis 4mg /ml Dexamethasone;Gait training;Stair training;Functional mobility training;Moist Heat;Traction;Therapeutic activities;Therapeutic exercise;Balance training;Neuromuscular re-education;Manual techniques;Dry needling;Passive range of motion;Patient/family education   ? PT Next Visit Plan HEP   ? PT Home Exercise Plan   ? Consulted and Agree with Plan of Care Patient   ? ?  ?  ? ?  ? ? ?Patient will benefit from skilled therapeutic intervention in order to improve the following deficits and impairments:  Abnormal gait, Decreased range of motion,  Difficulty walking, Pain, Decreased activity tolerance, Decreased balance, Decreased mobility, Decreased strength, Impaired sensation, Postural dysfunction, Improper body mechanics, Impaired flexibility ? ?Visit Diagnosis: ?Difficulty in walking, not elsewhere classified ? ?Acute bilateral low  back pain without sciatica ? ?Muscle weakness (generalized) ? ?Unsteadiness on feet ? ?Dizziness and giddiness ? ? ? ? ?Problem List ?Patient Active Problem List  ? Diagnosis Date Noted  ? Personal history of colonic polyps 04/07/2021  ? Thyroid nodule 04/07/2021  ? GERD (gastroesophageal reflux disease) 04/07/2021  ? Chronic bilateral low back pain with right-sided sciatica 04/07/2021  ? Scoliosis 04/07/2021  ? 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  ? Osteoarthritis, 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  ? Hypertension 04/07/2021  ? High cholesterol 04/07/2021  ? History of anemia 04/07/2021  ? Body mass index (BMI) of 33.0 to 33.9 in adult 04/07/2021  ? Vitamin D deficiency 04/07/2021  ? OA (osteoarthritis of spine) 01/16/2017  ? S/P lumbar laminectomy 11/02/2016  ? Abnormal CT scan, chest 04/01/2013  ? Upper airway cough syndrome 01/05/2013  ? ? ?Iona Beard,  DPT ?05/09/2021, 4:39 PM ? ?[place] [facility] ?[address] ?[place], [place], [address] ?Phone: [phone]   Fax:  [phone] ? ?Name: AMBERLEIGH GERKEN ?MRN: [id number] ?Date of Birth: 04-11-1940 ? ? ? ?

## 2021-05-09 NOTE — Patient Instructions (Signed)
Access Code: [email or web address] ?URL: [email or web address] ?Date: 05/09/2021 ?Prepared by: Oley Balm ? ?Exercises ?Single Knee to Chest Stretch - 1 x daily - 7 x weekly - 3 reps - 10 hold ?Supine Double Knee to Chest - 1 x daily - 7 x weekly - 3 reps - 10 hold ?Supine Hamstring Stretch - 1 x daily - 7 x weekly - 3 reps - 10 hold ?Supine Piriformis Stretch with Leg Straight - 1 x daily - 7 x weekly - 3 reps - 10 hold ?Supine Lower Trunk Rotation - 1 x daily - 7 x weekly - 3 reps - 10 hold ?Supine Bridge with Pelvic Floor Contraction - 1 x daily - 7 x weekly - 1 sets - 10 reps ?Bridge with Hip Abduction and Resistance - Ground Touches - 1 x daily - 7 x weekly - 1 sets - 10 reps ?Hooklying Clamshell with Resistance - 1 x daily - 7 x weekly - 3 sets - 10 reps ? ?

## 2021-05-10 ENCOUNTER — Encounter: Payer: Self-pay | Admitting: Family Medicine

## 2021-05-10 ENCOUNTER — Ambulatory Visit (INDEPENDENT_AMBULATORY_CARE_PROVIDER_SITE_OTHER): Payer: No Typology Code available for payment source | Admitting: Family Medicine

## 2021-05-10 VITALS — BP 130/78 | HR 78 | Temp 98.3°F | Ht 67.0 in | Wt 216.1 lb

## 2021-05-10 DIAGNOSIS — E7800 Pure hypercholesterolemia, unspecified: Secondary | ICD-10-CM | POA: Diagnosis not present

## 2021-05-10 DIAGNOSIS — I10 Essential (primary) hypertension: Secondary | ICD-10-CM

## 2021-05-10 DIAGNOSIS — R944 Abnormal results of kidney function studies: Secondary | ICD-10-CM | POA: Diagnosis not present

## 2021-05-10 DIAGNOSIS — G8929 Other chronic pain: Secondary | ICD-10-CM

## 2021-05-10 DIAGNOSIS — M5441 Lumbago with sciatica, right side: Secondary | ICD-10-CM

## 2021-05-10 DIAGNOSIS — M19011 Primary osteoarthritis, right shoulder: Secondary | ICD-10-CM

## 2021-05-10 DIAGNOSIS — M19012 Primary osteoarthritis, left shoulder: Secondary | ICD-10-CM | POA: Diagnosis not present

## 2021-05-10 LAB — RENAL FUNCTION PANEL
Albumin: 4 g/dL (ref 3.5–5.2)
BUN: 16 mg/dL (ref 6–23)
CO2: 34 mEq/L — ABNORMAL HIGH (ref 19–32)
Calcium: 9.6 mg/dL (ref 8.4–10.5)
Chloride: 103 mEq/L (ref 96–112)
Creatinine, Ser: 0.97 mg/dL (ref 0.40–1.20)
GFR: 55.86 mL/min — ABNORMAL LOW (ref >60.00)
Glucose, Bld: 86 mg/dL (ref 70–99)
Phosphorus: 2.8 mg/dL (ref 2.3–4.6)
Potassium: 4.2 mEq/L (ref 3.5–5.1)
Sodium: 142 mEq/L (ref 135–145)

## 2021-05-10 MED ORDER — TRAMADOL HCL 50 MG PO TABS: 50.0000 mg | ORAL_TABLET | Freq: Every day | ORAL | 0 refills | Status: AC | PRN

## 2021-05-10 NOTE — Assessment & Plan Note (Signed)
Chronic, well controlled ? ?Now improved control on amlodipine 5 mg daily as well as Lasix 20 mg once daily. ?She will continue to follow her blood pressures every few days at home. ?

## 2021-05-10 NOTE — Assessment & Plan Note (Signed)
Acute, resolved ? ?This was felt to be due to daily Lasix use and dehydration.  She is unable to stop the daily Lasix as she has resulting peripheral swelling.  She is instead increased her water intake and this has improved her GFR back to baseline. ?She will continue to hold NSAIDs until we determine her GFR is stable.  Of note she her brother does have history of renal issues, unknown type. ? ?

## 2021-05-10 NOTE — Patient Instructions (Addendum)
Please stop at the lab to have labs drawn. ? Follow daily weights... hold lasix if becoming dehydrated. ? Continue amlodipine. ? Use tylenol for pain on daily basis... can use tramadol for breakthrough pain. ? Continue PT for back ans shoulder. ? ? ?

## 2021-05-10 NOTE — Assessment & Plan Note (Signed)
Chronic, improving control ? ?She is currently followed by Dr. Yetta Barre, neurosurgery.  He has recommended physical therapy and follow-up in 6 weeks with possible reimaging.  She states she has noticed some improvement with physical therapy so far. ?She will continue to hold meloxicam for now.  If her renal function is improved persistently on increased water we may be able to retry meloxicam as well as continue Lasix ?

## 2021-05-10 NOTE — Assessment & Plan Note (Addendum)
Chronic, well controlled ? ?At goal on recent check on statin. ?

## 2021-05-10 NOTE — Assessment & Plan Note (Addendum)
Chronic, moderate control ? ?She has had some improvement in pain and mobility with home physical therapy.  She is now going to formal physical therapy.  Recommended follow-up with orthopedics for possible steroid injection if not continuing to improve. ? ?We will try a trial of tramadol 50 mg p.o. daily as needed pain.  No red flags on PDMP ?

## 2021-05-10 NOTE — Progress Notes (Signed)
Patient ID: Margaret Gordon, female    DOB: 05-31-1940, 81 y.o.   MRN: [id number]  This visit was conducted in person.  BP 130/78    Pulse 78    Temp 98.3 F (36.8 C) (Oral)    Ht 5\' 7"  (1.702 m)    Wt 216 lb 1 oz (98 kg)    SpO2 99%    BMI 33.84 kg/m    CC:  Chief Complaint  Patient presents with   Follow-up    Multiple Issues   Medication Refill    Tramadol-Not on current medication list    Subjective:   HPI: LATORYA BAUTCH is a 81 y.o. female presenting on 05/10/2021 for Follow-up (Multiple Issues) and Medication Refill (Tramadol-Not on current medication list)   Hypertension:   on amlodipine 5 mg daily, using lasix 20 mg once daily ( if she  uses prn swelling increases significantly) BP Readings from Last 3 Encounters:  05/10/21 130/78  04/07/21 100/64  11/03/16 (!) 143/87  Using medication without problems or lightheadedness:  none Chest pain with exertion: none Edema:  stable Short of breath: none Average home BPs: Other issues:   ARF: noted on higher dose of lasix.11/05/16 improved with decreasing lasix and increasing fluids. Held meloxicam given renal issues. Does have family history of kidney problems Estimated Creatinine Clearance: 53.1 mL/min (by C-G formula based on SCr of 1.05 mg/dL).  Chronic low back pain   Reviewed recent OV with Dr. Marland Kitchen Neurosurgery from 04/2021 Referred to PT.. first visit 05/04/2021   Plans follow up in 6 week to repeat X-rays.   Chronic pain in right shoulder.07/04/2021 at last OV recommended home PT for  right shoulder pain.  Relevant past medical, surgical, family and social history reviewed and updated as indicated. Interim medical history since our last visit reviewed. Allergies and medications reviewed and updated. Outpatient Medications Prior to Visit  Medication Sig Dispense Refill   amLODipine (NORVASC) 5 MG tablet Take 5 mg by mouth daily.     APPLE CIDER VINEGAR PO Take 1 each by mouth every morning.     atorvastatin (LIPITOR) 10  MG tablet Take 10 mg by mouth daily.     FIBER ADULT GUMMIES PO Take 1 each by mouth daily.     furosemide (LASIX) 20 MG tablet Take 20 mg by mouth daily.     Multiple Vitamin (MULTIVITAMIN WITH MINERALS) TABS tablet Take 1 tablet by mouth at bedtime.     pantoprazole (PROTONIX) 40 MG tablet Take 1 tablet (40 mg total) by mouth daily. 30 tablet 5   No facility-administered medications prior to visit.     Per HPI unless specifically indicated in ROS section below Review of Systems  Constitutional:  Negative for fatigue and fever.  HENT:  Negative for congestion.   Eyes:  Negative for pain.  Respiratory:  Negative for cough and shortness of breath.   Cardiovascular:  Negative for chest pain, palpitations and leg swelling.  Gastrointestinal:  Negative for abdominal pain.  Genitourinary:  Negative for dysuria and vaginal bleeding.  Musculoskeletal:  Negative for back pain.  Neurological:  Negative for syncope, light-headedness and headaches.  Psychiatric/Behavioral:  Negative for dysphoric mood.   Objective:  BP 130/78    Pulse 78    Temp 98.3 F (36.8 C) (Oral)    Ht 5\' 7"  (1.702 m)    Wt 216 lb 1 oz (98 kg)    SpO2 99%    BMI 33.84 kg/m  Wt Readings from Last 3 Encounters:  05/10/21 216 lb 1 oz (98 kg)  04/07/21 213 lb 2 oz (96.7 kg)  11/03/16 232 lb (105.2 kg)      Physical Exam Constitutional:      General: She is not in acute distress.    Appearance: Normal appearance. She is well-developed. She is not ill-appearing or toxic-appearing.  HENT:     Head: Normocephalic.     Right Ear: Hearing, tympanic membrane, ear canal and external ear normal. Tympanic membrane is not erythematous, retracted or bulging.     Left Ear: Hearing, tympanic membrane, ear canal and external ear normal. Tympanic membrane is not erythematous, retracted or bulging.     Nose: No mucosal edema or rhinorrhea.     Right Sinus: No maxillary sinus tenderness or frontal sinus tenderness.     Left Sinus: No  maxillary sinus tenderness or frontal sinus tenderness.     Mouth/Throat:     Pharynx: Uvula midline.  Eyes:     General: Lids are normal. Lids are everted, no foreign bodies appreciated.     Conjunctiva/sclera: Conjunctivae normal.     Pupils: Pupils are equal, round, and reactive to light.  Neck:     Thyroid: No thyroid mass or thyromegaly.     Vascular: No carotid bruit.     Trachea: Trachea normal.  Cardiovascular:     Rate and Rhythm: Normal rate and regular rhythm.     Pulses: Normal pulses.     Heart sounds: Normal heart sounds, S1 normal and S2 normal. No murmur heard.   No friction rub. No gallop.  Pulmonary:     Effort: Pulmonary effort is normal. No tachypnea or respiratory distress.     Breath sounds: Normal breath sounds. No decreased breath sounds, wheezing, rhonchi or rales.  Abdominal:     General: Bowel sounds are normal.     Palpations: Abdomen is soft.     Tenderness: There is no abdominal tenderness.  Musculoskeletal:     Right shoulder: Tenderness present. Decreased range of motion.     Cervical back: Normal range of motion and neck supple.     Lumbar back: Tenderness present. Decreased range of motion.  Skin:    General: Skin is warm and dry.     Findings: No rash.  Neurological:     Mental Status: She is alert.  Psychiatric:        Mood and Affect: Mood is not anxious or depressed.        Speech: Speech normal.        Behavior: Behavior normal. Behavior is cooperative.        Thought Content: Thought content normal.        Judgment: Judgment normal.      Results for orders placed or performed in visit on 04/20/21  Basic Metabolic Panel  Result Value Ref Range   Sodium 141 135 - 145 mEq/L   Potassium 3.9 3.5 - 5.1 mEq/L   Chloride 103 96 - 112 mEq/L   CO2 32 19 - 32 mEq/L   Glucose, Bld 81 70 - 99 mg/dL   BUN 20 6 - 23 mg/dL   Creatinine, Ser 3.84 0.40 - 1.20 mg/dL   GFR 66.59 (L) >93.57 mL/min   Calcium 9.5 8.4 - 10.5 mg/dL    This visit  occurred during the SARS-CoV-2 public health emergency.  Safety protocols were in place, including screening questions prior to the visit, additional usage of staff PPE, and extensive  cleaning of exam room while observing appropriate contact time as indicated for disinfecting solutions.   COVID 19 screen:  No recent travel or known exposure to COVID19 The patient denies respiratory symptoms of COVID 19 at this time. The importance of social distancing was discussed today.   Assessment and Plan Problem List Items Addressed This Visit     Chronic bilateral low back pain with right-sided sciatica    Chronic, improving control  She is currently followed by Dr. Yetta Barre, neurosurgery.  He has recommended physical therapy and follow-up in 6 weeks with possible reimaging.  She states she has noticed some improvement with physical therapy so far. She will continue to hold meloxicam for now.  If her renal function is improved persistently on increased water we may be able to retry meloxicam as well as continue Lasix      Relevant Medications   traMADol (ULTRAM) 50 MG tablet   Decreased GFR - Primary    Acute, resolved  This was felt to be due to daily Lasix use and dehydration.  She is unable to stop the daily Lasix as she has resulting peripheral swelling.  She is instead increased her water intake and this has improved her GFR back to baseline. She will continue to hold NSAIDs until we determine her GFR is stable.  Of note she her brother does have history of renal issues, unknown type.       Relevant Orders   Renal Function Panel   High cholesterol    Chronic, well controlled  At goal on recent check on statin.      Hypertension    Chronic, well controlled  Now improved control on amlodipine 5 mg daily as well as Lasix 20 mg once daily. She will continue to follow her blood pressures every few days at home.      OA (osteoarthritis) of shoulder    Chronic, moderate control  She has  had some improvement in pain and mobility with home physical therapy.  She is now going to formal physical therapy.  Recommended follow-up with orthopedics for possible steroid injection if not continuing to improve.  We will try a trial of tramadol 50 mg p.o. daily as needed pain.  No red flags on PDMP      Relevant Medications   traMADol (ULTRAM) 50 MG tablet       Kerby Nora, MD

## 2021-05-13 ENCOUNTER — Ambulatory Visit: Payer: No Typology Code available for payment source | Admitting: Physical Therapy

## 2021-05-13 ENCOUNTER — Other Ambulatory Visit: Payer: Self-pay

## 2021-05-13 DIAGNOSIS — M545 Low back pain, unspecified: Secondary | ICD-10-CM

## 2021-05-13 DIAGNOSIS — R262 Difficulty in walking, not elsewhere classified: Secondary | ICD-10-CM | POA: Diagnosis not present

## 2021-05-13 DIAGNOSIS — M6281 Muscle weakness (generalized): Secondary | ICD-10-CM

## 2021-05-13 NOTE — Therapy (Signed)
[place] ?[facility] ?[address] ?[place], [place], [address] ?Phone: [phone]   Fax:  [phone] ? ?Physical Therapy Treatment ? ?Patient Details  ?Name: Margaret Gordon ?MRN: [id number] ?Date of Birth: [birth year] ?Referring Provider (PT): Marikay Alar ? ? ?Encounter Date: 05/13/2021 ? ? PT End of Session - 05/13/21 0933   ? ? Visit Number 3   ? Date for PT Re-Evaluation 06/29/21   ? PT Start Time 0845   ? PT Stop Time 0925   ? PT Time Calculation (min) 40 min   ? ?  ?  ? ?  ? ? ?Past Medical History:  ?Diagnosis Date  ? Anemia   ? hx  ? Arthritis   ? Asthma   ? GERD (gastroesophageal reflux disease)   ? History of chicken pox   ? History of glaucoma   ? History of hiatal hernia   ? Hyperlipidemia   ? Hypertension   ? Migraines   ? Pneumonia   ? hx  ? Scoliosis   ? ? ?Past Surgical History:  ?Procedure Laterality Date  ? LUMBAR LAMINECTOMY/DECOMPRESSION MICRODISCECTOMY Left 11/02/2016  ? Procedure: Left Lumbar Three-Four, Lumbar Four-Five Laminectomy and Foraminotomy;  Surgeon: Tia Alert, MD;  Location: [facility] OR;  Service: Neurosurgery;  Laterality: Left;  ? TONSILLECTOMY    ? VESICOVAGINAL FISTULA CLOSURE W/ TAH    ? ? ?There were no vitals filed for this visit. ? ? Subjective Assessment - 05/13/21 0846   ? ? Subjective felt great after last session. mornings are my worst pain- improves with mvmt   ? Currently in Pain? Yes   ? Pain Score 8    ? Pain Location Back   ? ?  ?  ? ?  ? ? ? ? ? ? ? ? ? ? ? ? ? ? ? ? ? ? ? ? [facility] Adult PT Treatment/Exercise - 05/13/21 0001   ? ?  ? Lumbar Exercises: Aerobic  ? Nustep L4 x 6 minutes.   LE only  ?  ? Lumbar Exercises: Standing  ? Row Strengthening;Both;Theraband;20 reps   ? Theraband Level (Row) Level 2 (Red)   ? Shoulder Extension Strengthening;Both;Theraband;20 reps   ? Theraband Level (Shoulder Extension) Level 2 (Red)   ? Other Standing Lumbar Exercises hip ext and abd yellow tband 10x   difficult and increased pain  ?  ?  Lumbar Exercises: Seated  ? Sit to Stand 10 reps   with wt ball press  ? Other Seated Lumbar Exercises pevic ROM on sit fit 15 x   ?  ? Lumbar Exercises: Supine  ? Ab Set 15 reps   ? Clam 15 reps   red tband  ? Bent Knee Raise 20 reps   red tband  ? Bridge Non-compliant;15 reps;3 seconds   feet on ball, KTC and obl  ? Bridge with clamshell Compliant;10 reps   ?  ? Manual Therapy  ? Manual Therapy Passive ROM   ? Manual therapy comments minimal LE tightness   ? Passive ROM LE and trunk   ? ?  ?  ? ?  ? ? ? ? ? ? ? ? ? ? ? ? PT Short Term Goals - 05/13/21 0935   ? ?  ? PT SHORT TERM GOAL #1  ? Title I with basic HEP   ? Status Achieved   ? ?  ?  ? ?  ? ? ? ? PT Long Term Goals -  05/04/21 1836   ? ?  ? PT LONG TERM GOAL #1  ? Title I with final HEP.   ? Time 8   ? Period Weeks   ? Status New   ? Target Date 06/29/21   ?  ? PT LONG TERM GOAL #2  ? Title Increase FOTO score to 51   ? Baseline 44   ? Time 8   ? Period Weeks   ? Status New   ? Target Date 06/29/21   ?  ? PT LONG TERM GOAL #3  ? Title Patient will demosntrate lumbar ROM of at least 80% of full in all planes.   ? Baseline 25-50%   ? Time 8   ? Period Weeks   ? Status New   ? Target Date 06/29/21   ?  ? PT LONG TERM GOAL #4  ? Title Patient will report back pain of <3/10 after performing her normal daily activities Independently.   ? Baseline REquires occasional A from husband with back pain up to 7/10   ? Time 8   ? Period Weeks   ? Status New   ? Target Date 06/29/21   ? ?  ?  ? ?  ? ? ? ? ? ? ? ? Plan - 05/13/21 0934   ? ? Clinical Impression Statement pt states PT is helping and she feel sbetter after and HEP is helping. progressed ther ex with cuing, standing hip tband ex were difficut and increased pain. minimal LE tightness noted   ? PT Treatment/Interventions ADLs/Self Care Home Management;Iontophoresis 4mg /ml Dexamethasone;Gait training;Stair training;Functional mobility training;Moist Heat;Traction;Therapeutic activities;Therapeutic exercise;Balance  training;Neuromuscular re-education;Manual techniques;Dry needling;Passive range of motion;Patient/family education   ? PT Next Visit Plan progress ex and HEP   ? ?  ?  ? ?  ? ? ?Patient will benefit from skilled therapeutic intervention in order to improve the following deficits and impairments:  Abnormal gait, Decreased range of motion, Difficulty walking, Pain, Decreased activity tolerance, Decreased balance, Decreased mobility, Decreased strength, Impaired sensation, Postural dysfunction, Improper body mechanics, Impaired flexibility ? ?Visit Diagnosis: ?Difficulty in walking, not elsewhere classified ? ?Acute bilateral low back pain without sciatica ? ?Muscle weakness (generalized) ? ? ? ? ?Problem List ?Patient Active Problem List  ? Diagnosis Date Noted  ? Decreased GFR 05/10/2021  ? Personal history of colonic polyps 04/07/2021  ? Thyroid nodule 04/07/2021  ? GERD (gastroesophageal reflux disease) 04/07/2021  ? Chronic bilateral low back pain with right-sided sciatica 04/07/2021  ? Scoliosis 04/07/2021  ? 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  ? Osteoarthritis, 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  ? Hypertension 04/07/2021  ? High cholesterol 04/07/2021  ? History of anemia 04/07/2021  ? Body mass index (BMI) of 33.0 to 33.9 in adult 04/07/2021  ? Vitamin D deficiency 04/07/2021  ? OA (osteoarthritis of spine) 01/16/2017  ? S/P lumbar laminectomy 11/02/2016  ? Abnormal CT scan, chest 04/01/2013  ? Upper airway cough syndrome 01/05/2013  ? ? ?Tauren Delbuono,ANGIE, PTA ?05/13/2021, 9:36 AM ? ?[place] ?[facility] ?[address] ?[place], [place], [place] ?Phone: [phone]   Fax:  [phone] ? ?Name: Margaret Gordon ?MRN: Margaret Gordon ?Date of Birth: 12-Sep-1940 ? ? ? ?

## 2021-05-18 ENCOUNTER — Encounter: Payer: Self-pay | Admitting: Physical Therapy

## 2021-05-18 ENCOUNTER — Ambulatory Visit: Payer: No Typology Code available for payment source | Admitting: Physical Therapy

## 2021-05-18 ENCOUNTER — Other Ambulatory Visit: Payer: Self-pay

## 2021-05-18 DIAGNOSIS — R262 Difficulty in walking, not elsewhere classified: Secondary | ICD-10-CM

## 2021-05-18 DIAGNOSIS — M545 Low back pain, unspecified: Secondary | ICD-10-CM

## 2021-05-18 DIAGNOSIS — M6281 Muscle weakness (generalized): Secondary | ICD-10-CM

## 2021-05-18 DIAGNOSIS — R2681 Unsteadiness on feet: Secondary | ICD-10-CM

## 2021-05-18 DIAGNOSIS — R42 Dizziness and giddiness: Secondary | ICD-10-CM

## 2021-05-18 NOTE — Patient Instructions (Signed)
Access Code: [email or web address] ?URL: [email or web address] ?Date: 05/18/2021 ?Prepared by: Ethel Rana ? ?Exercises ?Single Knee to Chest Stretch - 1 x daily - 7 x weekly - 3 reps - 10 hold ?Supine Double Knee to Chest - 1 x daily - 7 x weekly - 3 reps - 10 hold ?Supine Hamstring Stretch - 1 x daily - 7 x weekly - 3 reps - 10 hold ?Supine Piriformis Stretch with Leg Straight - 1 x daily - 7 x weekly - 3 reps - 10 hold ?Supine Lower Trunk Rotation - 1 x daily - 7 x weekly - 3 reps - 10 hold ?Supine Bridge with Pelvic Floor Contraction - 1 x daily - 7 x weekly - 1 sets - 10 reps ?Bridge with Hip Abduction and Resistance - Ground Touches - 1 x daily - 7 x weekly - 1 sets - 10 reps ?Hooklying Clamshell with Resistance - 1 x daily - 7 x weekly - 3 sets - 10 reps ?Standing 3-Way Leg Reach with Resistance at Ankles and Counter Support - 1 x daily - 7 x weekly - 2 sets - 10 reps ? ?

## 2021-05-18 NOTE — Therapy (Signed)
[place] ?[facility] ?[address] ?[place], [place], [address] ?Phone: [phone]   Fax:  [phone] ? ?Physical Therapy Treatment ? ?Patient Details  ?Name: Margaret Gordon ?MRN: [id number] ?Date of Birth: Sep 13, 1940 ?Referring Provider (PT): Marikay Alar ? ? ?Encounter Date: 05/18/2021 ? ? PT End of Session - 05/18/21 1246   ? ? Visit Number 4   ? Date for PT Re-Evaluation 06/29/21   ? PT Start Time 1147   ? PT Stop Time 1228   ? PT Time Calculation (min) 41 min   ? Activity Tolerance Patient tolerated treatment well;Patient limited by pain   ? Behavior During Therapy [facility] for tasks assessed/performed   ? ?  ?  ? ?  ? ? ?Past Medical History:  ?Diagnosis Date  ? Anemia   ? hx  ? Arthritis   ? Asthma   ? GERD (gastroesophageal reflux disease)   ? History of chicken pox   ? History of glaucoma   ? History of hiatal hernia   ? Hyperlipidemia   ? Hypertension   ? Migraines   ? Pneumonia   ? hx  ? Scoliosis   ? ? ?Past Surgical History:  ?Procedure Laterality Date  ? LUMBAR LAMINECTOMY/DECOMPRESSION MICRODISCECTOMY Left 11/02/2016  ? Procedure: Left Lumbar Three-Four, Lumbar Four-Five Laminectomy and Foraminotomy;  Surgeon: Tia Alert, MD;  Location: [facility] OR;  Service: Neurosurgery;  Laterality: Left;  ? TONSILLECTOMY    ? VESICOVAGINAL FISTULA CLOSURE W/ TAH    ? ? ?There were no vitals filed for this visit. ? ? Subjective Assessment - 05/18/21 1154   ? ? Subjective Patietn reports that she still feels like she is moving forward and improving.   ? Pertinent History She is status post L3-4 L4-5 decompressive hemilaminectomy on the left.   ? Patient Stated Goals Be able to take care of herself and move without hurting.   ? Currently in Pain? Yes   ? Pain Score 7    ? Pain Location Back   ? Pain Orientation Right;Left   ? Pain Descriptors / Indicators Aching;Sharp;Jabbing   ? Pain Type Chronic pain   ? Pain Onset More than a month ago   ? Pain Frequency Constant   ? ?  ?  ? ?   ? ? ? ? ? ? ? ? ? ? ? ? ? ? ? ? ? ? ? ? [facility] Adult PT Treatment/Exercise - 05/18/21 0001   ? ?  ? Ambulation/Gait  ? Gait Comments Ambulated 1 x 100' without cane. She was steady, including turns, on level surfaces, but very tentative and fearful.   ?  ? Lumbar Exercises: Stretches  ? Active Hamstring Stretch Right;Left;2 reps;10 seconds   ? Single Knee to Chest Stretch Right;Left;2 reps;10 seconds   ? Double Knee to Chest Stretch 2 reps;10 seconds   ? Lower Trunk Rotation 2 reps;10 seconds   ?  ? Lumbar Exercises: Aerobic  ? Recumbent Bike L3 x 5 minutes   ?  ? Lumbar Exercises: Standing  ? Other Standing Lumbar Exercises hip ext and abd yellow tband 10x   Patient with increased difficulty durin gstanding activities  ? Other Standing Lumbar Exercises Paloff press with yellow Tband resistance   ?  ? Lumbar Exercises: Seated  ? Other Seated Lumbar Exercises Seated with rotation to the R, both hands on mat. Weight shifts forward and back into the diagonal with emphasis on trunk activation and stretch, Repeate to  L, 4 x each.   ? ?  ?  ? ?  ? ? ? ? ? ? ? ? ? ? PT Education - 05/18/21 1245   ? ? Education Details Updated HEP for hip abduction/ext in stance.   ? Person(s) Educated Patient   ? Methods Explanation;Demonstration;Handout   ? Comprehension Returned demonstration;Verbalized understanding   ? ?  ?  ? ?  ? ? ? PT Short Term Goals - 05/13/21 0935   ? ?  ? PT SHORT TERM GOAL #1  ? Title I with basic HEP   ? Status Achieved   ? ?  ?  ? ?  ? ? ? ? PT Long Term Goals - 05/18/21 1251   ? ?  ? PT LONG TERM GOAL #1  ? Title I with final HEP.   ? Baseline continuing to update exercises.   ? Time 7   ? Period Weeks   ? Status On-going   ? Target Date 06/29/21   ?  ? PT LONG TERM GOAL #2  ? Title Increase FOTO score to 51   ? Baseline 44   ? Time 8   ? Period Weeks   ? Status New   ? Target Date 06/29/21   ?  ? PT LONG TERM GOAL #3  ? Title Patient will demosntrate lumbar ROM of at least 80% of full in all planes.   ?  Baseline 25-50%   ? Time 8   ? Period Weeks   ? Status New   ? Target Date 06/29/21   ?  ? PT LONG TERM GOAL #4  ? Title Patient will report back pain of <3/10 after performing her normal daily activities Independently.   ? Baseline Continues to experience pain up to 8/10, but possibly less frequent.   ? Time 7   ? Period Weeks   ? Status On-going   ? Target Date 06/29/21   ? ?  ?  ? ?  ? ? ? ? ? ? ? ? Plan - 05/18/21 1247   ? ? Clinical Impression Statement Patient feels better overall. She has been inconsistent with HEP due to moving, but is getting back into the habit. She would like to eliminate the straight cane, has started doing so at home. Treatment focused on activating trunk for improved stability, then practice gait without the cane. She had no increased pain, but was very tentative without the cane. Therapist encouraged her to progress slowly without it due ot her obvious fear, but continue to practice as she was not unsteady.   ? 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 She is status post L3-4 L4-5 decompressive hemilaminectomy on the left.   ? Examination-Activity Limitations Reach Overhead;Bend;Locomotion Level;Transfers;Hygiene/Grooming;Dressing;Squat;Sleep   ? Examination-Participation Restrictions Cleaning;Meal Prep;Interpersonal Relationship;Laundry   ? Stability/Clinical Decision Making Evolving/Moderate complexity   ? Clinical Decision Making Moderate   ? Rehab Potential Good   ? PT Frequency 2x / week   ? PT Duration Other (comment)   7w  ? PT Treatment/Interventions ADLs/Self Care Home Management;Iontophoresis 4mg /ml Dexamethasone;Gait training;Stair training;Functional mobility training;Moist Heat;Traction;Therapeutic activities;Therapeutic exercise;Balance training;Neuromuscular re-education;Manual techniques;Dry needling;Passive range of motion;Patient/family education   ? PT Next Visit Plan Add upper trunk/scapular stabilization exercises  to HEP. Pateint has been educated that as her program gets longer, she can split it into 2 days so it is not too time consuming.   ? PT Home Exercise Plan [email or web address]   ? Consulted and Agree  with Plan of Care Patient   ? ?  ?  ? ?  ? ? ?Patient will benefit from skilled therapeutic intervention in order to improve the following deficits and impairments:  Abnormal gait, Decreased range of motion, Difficulty walking, Pain, Decreased activity tolerance, Decreased balance, Decreased mobility, Decreased strength, Impaired sensation, Postural dysfunction, Improper body mechanics, Impaired flexibility ? ?Visit Diagnosis: ?Difficulty in walking, not elsewhere classified ? ?Acute bilateral low back pain without sciatica ? ?Muscle weakness (generalized) ? ?Unsteadiness on feet ? ?Dizziness and giddiness ? ? ? ? ?Problem List ?Patient Active Problem List  ? Diagnosis Date Noted  ? Decreased GFR 05/10/2021  ? Personal history of colonic polyps 04/07/2021  ? Thyroid nodule 04/07/2021  ? GERD (gastroesophageal reflux disease) 04/07/2021  ? Chronic bilateral low back pain with right-sided sciatica 04/07/2021  ? Scoliosis 04/07/2021  ? 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  ? Osteoarthritis, 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  ? Hypertension 04/07/2021  ? High cholesterol 04/07/2021  ? History of anemia 04/07/2021  ? Body mass index (BMI) of 33.0 to 33.9 in adult 04/07/2021  ? Vitamin D deficiency 04/07/2021  ? OA (osteoarthritis of spine) 01/16/2017  ? S/P lumbar laminectomy 11/02/2016  ? Abnormal CT scan, chest 04/01/2013  ? Upper airway cough syndrome 01/05/2013  ? ? ?Iona Beard, DPT ?05/18/2021, 12:52 PM ? ?[place] ?[facility] ?[address] ?[place], [place], [address] ?Phone: [phone]   Fax:  [phone] ? ?Name: Margaret Gordon ?MRN: [id number] ?Date of Birth: 28-Feb-1941 ? ? ? ?

## 2021-05-20 ENCOUNTER — Other Ambulatory Visit: Payer: Self-pay

## 2021-05-20 ENCOUNTER — Ambulatory Visit: Payer: No Typology Code available for payment source | Admitting: Physical Therapy

## 2021-05-20 DIAGNOSIS — R262 Difficulty in walking, not elsewhere classified: Secondary | ICD-10-CM | POA: Diagnosis not present

## 2021-05-20 DIAGNOSIS — R2681 Unsteadiness on feet: Secondary | ICD-10-CM

## 2021-05-20 DIAGNOSIS — M545 Low back pain, unspecified: Secondary | ICD-10-CM

## 2021-05-20 DIAGNOSIS — M6281 Muscle weakness (generalized): Secondary | ICD-10-CM

## 2021-05-20 NOTE — Therapy (Signed)
[place] ?[facility] ?[address] ?[place], [place], [address] ?Phone: [phone]   Fax:  [phone] ? ?Physical Therapy Treatment ? ?Patient Details  ?Name: Margaret Gordon ?MRN: [id number] ?Date of Birth: Oct 17, 1940 ?Referring Provider (PT): Marikay Alar ? ? ?Encounter Date: 05/20/2021 ? ? PT End of Session - 05/20/21 0920   ? ? Visit Number 5   ? Date for PT Re-Evaluation 06/29/21   ? PT Start Time 0845   ? PT Stop Time 0930   ? PT Time Calculation (min) 45 min   ? ?  ?  ? ?  ? ? ?Past Medical History:  ?Diagnosis Date  ? Anemia   ? hx  ? Arthritis   ? Asthma   ? GERD (gastroesophageal reflux disease)   ? History of chicken pox   ? History of glaucoma   ? History of hiatal hernia   ? Hyperlipidemia   ? Hypertension   ? Migraines   ? Pneumonia   ? hx  ? Scoliosis   ? ? ?Past Surgical History:  ?Procedure Laterality Date  ? LUMBAR LAMINECTOMY/DECOMPRESSION MICRODISCECTOMY Left 11/02/2016  ? Procedure: Left Lumbar Three-Four, Lumbar Four-Five Laminectomy and Foraminotomy;  Surgeon: Tia Alert, MD;  Location: [facility] OR;  Service: Neurosurgery;  Laterality: Left;  ? TONSILLECTOMY    ? VESICOVAGINAL FISTULA CLOSURE W/ TAH    ? ? ?There were no vitals filed for this visit. ? ? Subjective Assessment - 05/20/21 0846   ? ? Subjective " i feel like I am getting stronger- it takes time I see"   ? Currently in Pain? Yes   ? Pain Score 6    ? Pain Location Back   ? ?  ?  ? ?  ? ? ? ? ? ? ? ? ? ? ? ? ? ? ? ? ? ? ? ? [facility] Adult PT Treatment/Exercise - 05/20/21 0001   ? ?  ? Lumbar Exercises: Aerobic  ? Recumbent Bike L 5 6 min LE only   ?  ? Lumbar Exercises: Machines for Strengthening  ? Cybex Lumbar Extension black tband trunk flex and ext 2 sets 10   ? Cybex Knee Extension 5# 10 x   ? Cybex Knee Flexion 15# 10 x   ? Other Lumbar Machine Exercise seated row 15# 2 sets 10   ?  ? Lumbar Exercises: Standing  ? Heel Raises 20 reps   on black bar  ? Row Strengthening;Both;Theraband;20 reps    ? Shoulder Extension Strengthening;Both;Theraband;20 reps   ? Other Standing Lumbar Exercises hip ext and abd yellow tband 10x   ? Other Standing Lumbar Exercises 4 inch step up fwd and later 8 each with UE support   ?  ? Lumbar Exercises: Seated  ? Sit to Stand 10 reps   wt ball press  ? Other Seated Lumbar Exercises resisted clams, hip flex 15 each   iso ball btwn the knee squeeze 15 x  ? ?  ?  ? ?  ? ? ? ? ? ? ? ? ? ? ? ? PT Short Term Goals - 05/13/21 0935   ? ?  ? PT SHORT TERM GOAL #1  ? Title I with basic HEP   ? Status Achieved   ? ?  ?  ? ?  ? ? ? ? PT Long Term Goals - 05/18/21 1251   ? ?  ? PT LONG TERM GOAL #1  ? Title I with  final HEP.   ? Baseline continuing to update exercises.   ? Time 7   ? Period Weeks   ? Status On-going   ? Target Date 06/29/21   ?  ? PT LONG TERM GOAL #2  ? Title Increase FOTO score to 51   ? Baseline 44   ? Time 8   ? Period Weeks   ? Status New   ? Target Date 06/29/21   ?  ? PT LONG TERM GOAL #3  ? Title Patient will demosntrate lumbar ROM of at least 80% of full in all planes.   ? Baseline 25-50%   ? Time 8   ? Period Weeks   ? Status New   ? Target Date 06/29/21   ?  ? PT LONG TERM GOAL #4  ? Title Patient will report back pain of <3/10 after performing her normal daily activities Independently.   ? Baseline Continues to experience pain up to 8/10, but possibly less frequent.   ? Time 7   ? Period Weeks   ? Status On-going   ? Target Date 06/29/21   ? ?  ?  ? ?  ? ? ? ? ? ? ? ? Plan - 05/20/21 0920   ? ? Clinical Impression Statement progressed ther ex with addition of machine interventions and pt tolerated well. pt does have RT shld issue that limited ROM. standing hip tband ext arre difficult for pt. pt walked around clinic without AD and did very well   ? PT Treatment/Interventions ADLs/Self Care Home Management;Iontophoresis 4mg /ml Dexamethasone;Gait training;Stair training;Functional mobility training;Moist Heat;Traction;Therapeutic activities;Therapeutic  exercise;Balance training;Neuromuscular re-education;Manual techniques;Dry needling;Passive range of motion;Patient/family education   ? PT Next Visit Plan Add upper trunk/scapular stabilization exercises to HEP. Pateint has been educated that as her program gets longer, she can split it into 2 days so it is not too time consuming.   ? ?  ?  ? ?  ? ? ?Patient will benefit from skilled therapeutic intervention in order to improve the following deficits and impairments:  Abnormal gait, Decreased range of motion, Difficulty walking, Pain, Decreased activity tolerance, Decreased balance, Decreased mobility, Decreased strength, Impaired sensation, Postural dysfunction, Improper body mechanics, Impaired flexibility ? ?Visit Diagnosis: ?Difficulty in walking, not elsewhere classified ? ?Acute bilateral low back pain without sciatica ? ?Muscle weakness (generalized) ? ?Unsteadiness on feet ? ? ? ? ?Problem List ?Patient Active Problem List  ? Diagnosis Date Noted  ? Decreased GFR 05/10/2021  ? Personal history of colonic polyps 04/07/2021  ? Thyroid nodule 04/07/2021  ? GERD (gastroesophageal reflux disease) 04/07/2021  ? Chronic bilateral low back pain with right-sided sciatica 04/07/2021  ? Scoliosis 04/07/2021  ? 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  ? Osteoarthritis, 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  ? Hypertension 04/07/2021  ? High cholesterol 04/07/2021  ? History of anemia 04/07/2021  ? Body mass index (BMI) of 33.0 to 33.9 in adult 04/07/2021  ? Vitamin D deficiency 04/07/2021  ? OA (osteoarthritis of spine) 01/16/2017  ? S/P lumbar laminectomy 11/02/2016  ? Abnormal CT scan, chest 04/01/2013  ? Upper airway cough syndrome 01/05/2013  ? ? ?Lorena Clearman,ANGIE, PTA ?05/20/2021, 9:22 AM ? ?[place] ?[facility] ?[address] ?[place], [place], [place] ?Phone: [phone]   Fax:  [phone] ? ?Name: Margaret Gordon ?MRN: Roseanne Reno ?Date of Birth: 10/09/1940 ? ? ? ?

## 2021-05-23 ENCOUNTER — Other Ambulatory Visit: Payer: Self-pay

## 2021-05-23 ENCOUNTER — Ambulatory Visit: Payer: No Typology Code available for payment source | Admitting: Physical Therapy

## 2021-05-23 ENCOUNTER — Encounter: Payer: Self-pay | Admitting: Physical Therapy

## 2021-05-23 DIAGNOSIS — R42 Dizziness and giddiness: Secondary | ICD-10-CM

## 2021-05-23 DIAGNOSIS — M545 Low back pain, unspecified: Secondary | ICD-10-CM

## 2021-05-23 DIAGNOSIS — R262 Difficulty in walking, not elsewhere classified: Secondary | ICD-10-CM | POA: Diagnosis not present

## 2021-05-23 DIAGNOSIS — R2681 Unsteadiness on feet: Secondary | ICD-10-CM

## 2021-05-23 DIAGNOSIS — M6281 Muscle weakness (generalized): Secondary | ICD-10-CM

## 2021-05-23 NOTE — Patient Instructions (Signed)
Access Code: [email or web address] ?URL: [email or web address] ?Date: 05/23/2021 ?Prepared by: Oley Balm ? ?Exercises ?Single Knee to Chest Stretch - 1 x daily - 7 x weekly - 3 reps - 10 hold ?Supine Double Knee to Chest - 1 x daily - 7 x weekly - 3 reps - 10 hold ?Supine Hamstring Stretch - 1 x daily - 7 x weekly - 3 reps - 10 hold ?Supine Piriformis Stretch with Leg Straight - 1 x daily - 7 x weekly - 3 reps - 10 hold ?Supine Lower Trunk Rotation - 1 x daily - 7 x weekly - 3 reps - 10 hold ?Supine Bridge with Pelvic Floor Contraction - 1 x daily - 7 x weekly - 1 sets - 10 reps ?Bridge with Hip Abduction and Resistance - Ground Touches - 1 x daily - 7 x weekly - 1 sets - 10 reps ?Hooklying Clamshell with Resistance - 1 x daily - 7 x weekly - 3 sets - 10 reps ?Standing 3-Way Leg Reach with Resistance at Ankles and Counter Support - 1 x daily - 7 x weekly - 2 sets - 10 reps ?Shoulder Extension with Resistance - 1 x daily - 7 x weekly - 2 sets - 10 reps ?Standing Bilateral Low Shoulder Row with Anchored Resistance - 1 x daily - 7 x weekly - 2 sets - 10 reps ?Shoulder External Rotation and Scapular Retraction with Resistance - 1 x daily - 7 x weekly - 2 sets - 10 reps ? ?

## 2021-05-23 NOTE — Therapy (Signed)
[facility] [address] ?[place], [place], [address] ?Phone: [phone]   Fax:  [phone] ? ?Physical Therapy Treatment ? ?Patient Details  ?Name: Margaret Gordon ?MRN: [id number] ?Date of Birth: 16-Sep-1940 ?Referring Provider (PT): Sherley Bounds ? ? ?Encounter Date: 05/23/2021 ? ? PT End of Session - 05/23/21 1052   ? ? Visit Number 6   ? Date for PT Re-Evaluation 06/29/21   ? PT Start Time 1016   ? PT Stop Time 1054   ? PT Time Calculation (min) 38 min   ? Activity Tolerance Patient tolerated treatment well;Patient limited by pain   ? Behavior During Therapy [facility] for tasks assessed/performed   ? ?  ?  ? ?  ? ? ?Past Medical History:  ?Diagnosis Date  ? Anemia   ? hx  ? Arthritis   ? Asthma   ? GERD (gastroesophageal reflux disease)   ? History of chicken pox   ? History of glaucoma   ? History of hiatal hernia   ? Hyperlipidemia   ? Hypertension   ? Migraines   ? Pneumonia   ? hx  ? Scoliosis   ? ? ?Past Surgical History:  ?Procedure Laterality Date  ? LUMBAR LAMINECTOMY/DECOMPRESSION MICRODISCECTOMY Left 11/02/2016  ? Procedure: Left Lumbar Three-Four, Lumbar Four-Five Laminectomy and Foraminotomy;  Surgeon: Eustace Moore, MD;  Location: [facility];  Service: Neurosurgery;  Laterality: Left;  ? TONSILLECTOMY    ? VESICOVAGINAL FISTULA CLOSURE W/ TAH    ? ? ?There were no vitals filed for this visit. ? ? Subjective Assessment - 05/23/21 1017   ? ? Subjective Patient reports no issues.   ? Pertinent History She is status post L3-4 L4-5 decompressive hemilaminectomy on the left.   ? Currently in Pain? No/denies   ? ?  ?  ? ?  ? ? ? ? ? ? ? ? ? ? ? ? ? ? ? ? ? ? ? ? [facility] Adult PT Treatment/Exercise - 05/23/21 0001   ? ?  ? Lumbar Exercises: Standing  ? Row Strengthening;Both;Theraband;20 reps   ? Theraband Level (Row) Level 2 (Red)   ? Shoulder Extension Strengthening;Both;Theraband;20 reps   ? Theraband Level (Shoulder Extension) Level 2 (Red)   ? Other Standing Lumbar  Exercises Shoulder ER- 2 x 10 reps iwth red T band   ? Other Standing Lumbar Exercises seated rotation to L, reach out to side, engaging a trunk weight sihft as far as possible, then return to center. 5 reps to eachside.   ?  ? Lumbar Exercises: Seated  ? Other Seated Lumbar Exercises R AAROM with dowel   ? Other Seated Lumbar Exercises Trunk extension against Bl Tband resistance, 2 x 10 reps.   ? ?  ?  ? ?  ? ? ? ? ? ? ? ? ? ? PT Education - 05/23/21 1051   ? ? Education Details Updated HEP. Enouraged to call Dr and request order for PT assessment of R shoulder due to continued stiffness.   ? Person(s) Educated Patient   ? Methods Explanation;Demonstration;Handout   ? Comprehension Returned demonstration;Verbalized understanding   ? ?  ?  ? ?  ? ? ? PT Short Term Goals - 05/13/21 0935   ? ?  ? PT SHORT TERM GOAL #1  ? Title I with basic HEP   ? Status Achieved   ? ?  ?  ? ?  ? ? ? ? PT Long Term  Goals - 05/18/21 1251   ? ?  ? PT LONG TERM GOAL #1  ? Title I with final HEP.   ? Baseline continuing to update exercises.   ? Time 7   ? Period Weeks   ? Status On-going   ? Target Date 06/29/21   ?  ? PT LONG TERM GOAL #2  ? Title Increase FOTO score to 51   ? Baseline 44   ? Time 8   ? Period Weeks   ? Status New   ? Target Date 06/29/21   ?  ? PT LONG TERM GOAL #3  ? Title Patient will demosntrate lumbar ROM of at least 80% of full in all planes.   ? Baseline 25-50%   ? Time 8   ? Period Weeks   ? Status New   ? Target Date 06/29/21   ?  ? PT LONG TERM GOAL #4  ? Title Patient will report back pain of <3/10 after performing her normal daily activities Independently.   ? Baseline Continues to experience pain up to 8/10, but possibly less frequent.   ? Time 7   ? Period Weeks   ? Status On-going   ? Target Date 06/29/21   ? ?  ?  ? ?  ? ? ? ? ? ? ? ? Plan - 05/23/21 1021   ? ? Clinical Impression Statement Patienet reports no new issues iwth her back. Her R shoulder continues to be tight and painful, interferes at times  iwth her self care. Encouraged her to call her Dr and request an assessment.   ? Stability/Clinical Decision Making Evolving/Moderate complexity   ? Clinical Decision Making Moderate   ? Rehab Potential Good   ? PT Frequency 2x / week   ? PT Duration 6 weeks   ? PT Treatment/Interventions ADLs/Self Care Home Management;Iontophoresis 4mg /ml Dexamethasone;Gait training;Stair training;Functional mobility training;Moist Heat;Traction;Therapeutic activities;Therapeutic exercise;Balance training;Neuromuscular re-education;Manual techniques;Dry needling;Passive range of motion;Patient/family education   ? PT Next Visit Plan Progress as tolerated. Re-assess goals.   ? PT [place]   ? Consulted and Agree with Plan of Care Patient   ? ?  ?  ? ?  ? ? ?Patient will benefit from skilled therapeutic intervention in order to improve the following deficits and impairments:  Abnormal gait, Decreased range of motion, Difficulty walking, Pain, Decreased activity tolerance, Decreased balance, Decreased mobility, Decreased strength, Impaired sensation, Postural dysfunction, Improper body mechanics, Impaired flexibility ? ?Visit Diagnosis: ?Difficulty in walking, not elsewhere classified ? ?Acute bilateral low back pain without sciatica ? ?Muscle weakness (generalized) ? ?Unsteadiness on feet ? ?Dizziness and giddiness ? ? ? ? ?Problem List ?Patient Active Problem List  ? Diagnosis Date Noted  ? Decreased GFR 05/10/2021  ? Personal history of colonic polyps 04/07/2021  ? Thyroid nodule 04/07/2021  ? GERD (gastroesophageal reflux disease) 04/07/2021  ? Chronic bilateral low back pain with right-sided sciatica 04/07/2021  ? Scoliosis 04/07/2021  ? 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  ? Osteoarthritis, 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  ? Hypertension 04/07/2021  ? High cholesterol 04/07/2021  ? History of anemia 04/07/2021  ? Body mass index (BMI) of 33.0 to 33.9 in adult 04/07/2021  ? Vitamin D deficiency 04/07/2021  ? OA  (osteoarthritis of spine) 01/16/2017  ? S/P lumbar laminectomy 11/02/2016  ? Abnormal CT scan, chest 04/01/2013  ? Upper airway cough syndrome 01/05/2013  ? ? ?Marcelina Morel, DPT ?05/23/2021, 10:58 AM ? ?[facility] [address]  [address] ?[place], [place], [address] ?Phone: [phone]   Fax:  [phone] ? ?Name: Margaret Gordon ?MRN: [id number] ?Date of Birth: February 25, 1941 ? ? ? ?

## 2021-05-25 ENCOUNTER — Ambulatory Visit: Payer: No Typology Code available for payment source | Admitting: Physical Therapy

## 2021-05-25 ENCOUNTER — Other Ambulatory Visit: Payer: Self-pay

## 2021-05-25 ENCOUNTER — Encounter: Payer: Self-pay | Admitting: Physical Therapy

## 2021-05-25 DIAGNOSIS — R262 Difficulty in walking, not elsewhere classified: Secondary | ICD-10-CM | POA: Diagnosis not present

## 2021-05-25 DIAGNOSIS — M545 Low back pain, unspecified: Secondary | ICD-10-CM

## 2021-05-25 DIAGNOSIS — M6281 Muscle weakness (generalized): Secondary | ICD-10-CM

## 2021-05-25 NOTE — Patient Instructions (Signed)
Access Code: [email or web address] ?URL: [email or web address] ?Date: 05/25/2021 ?Prepared by: [facility] ? ?Exercises ?Supine Double Knee to Chest - 1 x daily - 7 x weekly - 3 reps - 10 hold ?Supine Piriformis Stretch with Leg Straight - 1 x daily - 7 x weekly - 3 reps - 10 hold ?Supine Lower Trunk Rotation - 1 x daily - 7 x weekly - 3 reps - 10 hold ?Bridge with Hip Abduction and Resistance - Ground Touches - 1 x daily - 7 x weekly - 1 sets - 10 reps ?Standing 3-Way Leg Reach with Resistance at Ankles and Counter Support - 1 x daily - 7 x weekly - 2 sets - 10 reps ?Shoulder Extension with Resistance - 1 x daily - 7 x weekly - 2 sets - 10 reps ?Standing Bilateral Low Shoulder Row with Anchored Resistance - 1 x daily - 7 x weekly - 2 sets - 10 reps ?Shoulder External Rotation and Scapular Retraction with Resistance - 1 x daily - 7 x weekly - 2 sets - 10 reps ? ?

## 2021-05-25 NOTE — Therapy (Signed)
[facility] ?[facility] ?[address] ?[place], [place], [address] ?Phone: [phone]   Fax:  [phone] ? ?Physical Therapy Treatment ? ?Patient Details  ?Name: Margaret Gordon ?MRN: [id number] ?Date of Birth: September 23, 1940 ?Referring Provider (PT): Marikay Alar ? ? ?Encounter Date: 05/25/2021 ? ? PT End of Session - 05/25/21 1213   ? ? Visit Number 7   ? Date for PT Re-Evaluation 06/29/21   ? PT Start Time 1015   ? PT Stop Time 1058   ? PT Time Calculation (min) 43 min   ? Activity Tolerance Patient tolerated treatment well;No increased pain   ? Behavior During Therapy [facility] for tasks assessed/performed   ? ?  ?  ? ?  ? ? ?Past Medical History:  ?Diagnosis Date  ? Anemia   ? hx  ? Arthritis   ? Asthma   ? GERD (gastroesophageal reflux disease)   ? History of chicken pox   ? History of glaucoma   ? History of hiatal hernia   ? Hyperlipidemia   ? Hypertension   ? Migraines   ? Pneumonia   ? hx  ? Scoliosis   ? ? ?Past Surgical History:  ?Procedure Laterality Date  ? LUMBAR LAMINECTOMY/DECOMPRESSION MICRODISCECTOMY Left 11/02/2016  ? Procedure: Left Lumbar Three-Four, Lumbar Four-Five Laminectomy and Foraminotomy;  Surgeon: Tia Alert, MD;  Location: [facility];  Service: Neurosurgery;  Laterality: Left;  ? TONSILLECTOMY    ? VESICOVAGINAL FISTULA CLOSURE W/ TAH    ? ? ?There were no vitals filed for this visit. ? ? Subjective Assessment - 05/25/21 1021   ? ? Subjective Pt states that today she is not having any issues. She is doing her HEP maybe 1-2 days   ? Pertinent History She is status post L3-4 L4-5 decompressive hemilaminectomy on the left.   ? Currently in Pain? No/denies   ? ?  ?  ? ?  ? ? ? ? ? ? ? ? ? ? ? ? ? ? ? ? ? ? ? ? [facility] Adult PT Treatment/Exercise - 05/25/21 0001   ? ?  ? Lumbar Exercises: Standing  ? Other Standing Lumbar Exercises single UE extension #5 with ipsilateral hip flexion x8 reps each, Pt using SPC for some balance support   ? Other Standing Lumbar  Exercises hip abduction toe tap/hold 5x5 sec each- BUE support   ?  ? Lumbar Exercises: Seated  ? Other Seated Lumbar Exercises TrA activation x8 reps- focus on trunk only   ? Other Seated Lumbar Exercises TrA activition with alternating slow march x10 reps, progressed to contralateral UE extension #5 x8 reps each   ?  ? Lumbar Exercises: Supine  ? Other Supine Lumbar Exercises hip hooklying 90/90 rotation x10 reps   ? Other Supine Lumbar Exercises TrA activation with BUE pressdown with red TB x10 reps- PT cues to avoid clenching glutes   ? ?  ?  ? ?  ? ? ? ? ? ? ? ? ? ? PT Education - 05/25/21 1212   ? ? Education Details updates to HEP-encouraged 2 days of HEP adherance outside of PT appointments   ? Person(s) Educated Patient   ? Methods Explanation;Handout;Verbal cues   ? Comprehension Verbalized understanding;Returned demonstration   ? ?  ?  ? ?  ? ? ? PT Short Term Goals - 05/13/21 0935   ? ?  ? PT SHORT TERM GOAL #1  ? Title I with basic HEP   ?  Status Achieved   ? ?  ?  ? ?  ? ? ? ? PT Long Term Goals - 05/18/21 1251   ? ?  ? PT LONG TERM GOAL #1  ? Title I with final HEP.   ? Baseline continuing to update exercises.   ? Time 7   ? Period Weeks   ? Status On-going   ? Target Date 06/29/21   ?  ? PT LONG TERM GOAL #2  ? Title Increase FOTO score to 51   ? Baseline 44   ? Time 8   ? Period Weeks   ? Status New   ? Target Date 06/29/21   ?  ? PT LONG TERM GOAL #3  ? Title Patient will demosntrate lumbar ROM of at least 80% of full in all planes.   ? Baseline 25-50%   ? Time 8   ? Period Weeks   ? Status New   ? Target Date 06/29/21   ?  ? PT LONG TERM GOAL #4  ? Title Patient will report back pain of <3/10 after performing her normal daily activities Independently.   ? Baseline Continues to experience pain up to 8/10, but possibly less frequent.   ? Time 7   ? Period Weeks   ? Status On-going   ? Target Date 06/29/21   ? ?  ?  ? ?  ? ? ? ? ? ? ? ? Plan - 05/25/21 1213   ? ? Clinical Impression Statement Pt  arrived without exacerbation of her low back pain. Pt is having difficulty maintaining compliance with her HEP. PT was able to adjust her current program to encourage compliance and address compensatory patterns with transverse abdominus activation. Pt was able to progress all exercises with moderate feedback on technique, and she denied increase in low back or Rt shoulder pain today. Will continue to progress trunk/hip strength and endurance moving forward.   ? Stability/Clinical Decision Making Evolving/Moderate complexity   ? Rehab Potential Good   ? PT Frequency 2x / week   ? PT Duration 6 weeks   ? PT Treatment/Interventions ADLs/Self Care Home Management;Iontophoresis 4mg /ml Dexamethasone;Gait training;Stair training;Functional mobility training;Moist Heat;Traction;Therapeutic activities;Therapeutic exercise;Balance training;Neuromuscular re-education;Manual techniques;Dry needling;Passive range of motion;Patient/family education   ? PT Next Visit Plan progress hip strength; trunk/lat strengthening progressions   ? PT Home Exercise Plan   ? Consulted and Agree with Plan of Care Patient   ? ?  ?  ? ?  ? ? ?Patient will benefit from skilled therapeutic intervention in order to improve the following deficits and impairments:  Abnormal gait, Decreased range of motion, Difficulty walking, Pain, Decreased activity tolerance, Decreased balance, Decreased mobility, Decreased strength, Impaired sensation, Postural dysfunction, Improper body mechanics, Impaired flexibility ? ?Visit Diagnosis: ?Difficulty in walking, not elsewhere classified ? ?Acute bilateral low back pain without sciatica ? ?Muscle weakness (generalized) ? ? ? ? ?Problem List ?Patient Active Problem List  ? Diagnosis Date Noted  ? Decreased GFR 05/10/2021  ? Personal history of colonic polyps 04/07/2021  ? Thyroid nodule 04/07/2021  ? GERD (gastroesophageal reflux disease) 04/07/2021  ? Chronic bilateral low back pain with right-sided sciatica  04/07/2021  ? Scoliosis 04/07/2021  ? 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  ? Osteoarthritis, 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  ? Hypertension 04/07/2021  ? High cholesterol 04/07/2021  ? History of anemia 04/07/2021  ? Body mass index (BMI) of 33.0 to 33.9 in adult 04/07/2021  ? Vitamin D deficiency 04/07/2021  ? OA (  osteoarthritis of spine) 01/16/2017  ? S/P lumbar laminectomy 11/02/2016  ? Abnormal CT scan, chest 04/01/2013  ? Upper airway cough syndrome 01/05/2013  ?2:14 PM,05/25/21 ?Donita BrooksSara Lela Gell PT, DPT ?[facility]  ?[phone] ? ?[facility] ?[id number] ?[place], [place], [address] ?Phone: [phone]   Fax:  [phone] ? ?Name: Margaret Gordon ?MRN: [id number] ?Date of Birth: 1940-09-13 ? ? ? ?

## 2021-05-30 ENCOUNTER — Other Ambulatory Visit: Payer: Self-pay

## 2021-05-30 ENCOUNTER — Encounter: Payer: Self-pay | Admitting: Physical Therapy

## 2021-05-30 ENCOUNTER — Ambulatory Visit: Payer: No Typology Code available for payment source | Admitting: Physical Therapy

## 2021-05-30 DIAGNOSIS — R2681 Unsteadiness on feet: Secondary | ICD-10-CM

## 2021-05-30 DIAGNOSIS — R262 Difficulty in walking, not elsewhere classified: Secondary | ICD-10-CM | POA: Diagnosis not present

## 2021-05-30 DIAGNOSIS — M6281 Muscle weakness (generalized): Secondary | ICD-10-CM

## 2021-05-30 DIAGNOSIS — M545 Low back pain, unspecified: Secondary | ICD-10-CM

## 2021-05-30 NOTE — Therapy (Signed)
[facility] ?[facility] ?[address] ?[place], [place], [address] ?Phone: [phone]   Fax:  [phone] ? ?Physical Therapy Treatment ? ?Patient Details  ?Name: Margaret Gordon ?MRN: [id number] ?Date of Birth: 1940-11-05 ?Referring Provider (PT): Marikay Alar ? ? ?Encounter Date: 05/30/2021 ? ? PT End of Session - 05/30/21 1102   ? ? Visit Number 8   ? Date for PT Re-Evaluation 06/29/21   ? PT Start Time 1015   ? PT Stop Time 1100   ? PT Time Calculation (min) 45 min   ? Activity Tolerance Patient tolerated treatment well;No increased pain   ? Behavior During Therapy [facility] for tasks assessed/performed   ? ?  ?  ? ?  ? ? ?Past Medical History:  ?Diagnosis Date  ? Anemia   ? hx  ? Arthritis   ? Asthma   ? GERD (gastroesophageal reflux disease)   ? History of chicken pox   ? History of glaucoma   ? History of hiatal hernia   ? Hyperlipidemia   ? Hypertension   ? Migraines   ? Pneumonia   ? hx  ? Scoliosis   ? ? ?Past Surgical History:  ?Procedure Laterality Date  ? LUMBAR LAMINECTOMY/DECOMPRESSION MICRODISCECTOMY Left 11/02/2016  ? Procedure: Left Lumbar Three-Four, Lumbar Four-Five Laminectomy and Foraminotomy;  Surgeon: Tia Alert, MD;  Location: [facility];  Service: Neurosurgery;  Laterality: Left;  ? TONSILLECTOMY    ? VESICOVAGINAL FISTULA CLOSURE W/ TAH    ? ? ?There were no vitals filed for this visit. ? ? Subjective Assessment - 05/30/21 1022   ? ? Subjective Not too bad today.  Still moving slow   ? Currently in Pain? Yes   ? Pain Score 5    ? Pain Location Back   ? Pain Descriptors / Indicators Aching;Tightness;Spasm   ? Aggravating Factors  activity   ? ?  ?  ? ?  ? ? ? ? ? ? ? ? ? ? ? ? ? ? ? ? ? ? ? ? [facility] Adult PT Treatment/Exercise - 05/30/21 0001   ? ?  ? Lumbar Exercises: Stretches  ? Passive Hamstring Stretch Right;Left;3 reps;20 seconds   ? Piriformis Stretch Right;Left;3 reps;20 seconds   ?  ? Lumbar Exercises: Aerobic  ? Recumbent Bike level 3 x 5 minutes   ?   ? Lumbar Exercises: Machines for Strengthening  ? Cybex Knee Extension 5# 2x10   ? Cybex Knee Flexion 20# 2x10   ?  ? Lumbar Exercises: Seated  ? Other Seated Lumbar Exercises red tband shoulder row and extension cues for posture and core activation, ball b/n knees squeeze   ?  ? Lumbar Exercises: Supine  ? Pelvic Tilt 15 reps   ? Clam 15 reps   ? Clam Limitations red tband   ? Other Supine Lumbar Exercises feet on ball K2C, trunk rotation, small bridge and isometric abs   ? ?  ?  ? ?  ? ? ? ? ? ? ? ? ? ? ? ? PT Short Term Goals - 05/13/21 0935   ? ?  ? PT SHORT TERM GOAL #1  ? Title I with basic HEP   ? Status Achieved   ? ?  ?  ? ?  ? ? ? ? PT Long Term Goals - 05/18/21 1251   ? ?  ? PT LONG TERM GOAL #1  ? Title I with final HEP.   ? Baseline  continuing to update exercises.   ? Time 7   ? Period Weeks   ? Status On-going   ? Target Date 06/29/21   ?  ? PT LONG TERM GOAL #2  ? Title Increase FOTO score to 51   ? Baseline 44   ? Time 8   ? Period Weeks   ? Status New   ? Target Date 06/29/21   ?  ? PT LONG TERM GOAL #3  ? Title Patient will demosntrate lumbar ROM of at least 80% of full in all planes.   ? Baseline 25-50%   ? Time 8   ? Period Weeks   ? Status New   ? Target Date 06/29/21   ?  ? PT LONG TERM GOAL #4  ? Title Patient will report back pain of <3/10 after performing her normal daily activities Independently.   ? Baseline Continues to experience pain up to 8/10, but possibly less frequent.   ? Time 7   ? Period Weeks   ? Status On-going   ? Target Date 06/29/21   ? ?  ?  ? ?  ? ? ? ? ? ? ? ? Plan - 05/30/21 1104   ? ? Clinical Impression Statement Patient tolerated exercises well, did a lot of cueing for core activation and posture control, needed some tactile cues to get it right.  No increased pain with anything today   ? PT Next Visit Plan progress hip strength; trunk/lat strengthening progressions   ? Consulted and Agree with Plan of Care Patient   ? ?  ?  ? ?  ? ? ?Patient will benefit from  skilled therapeutic intervention in order to improve the following deficits and impairments:  Abnormal gait, Decreased range of motion, Difficulty walking, Pain, Decreased activity tolerance, Decreased balance, Decreased mobility, Decreased strength, Impaired sensation, Postural dysfunction, Improper body mechanics, Impaired flexibility ? ?Visit Diagnosis: ?Difficulty in walking, not elsewhere classified ? ?Acute bilateral low back pain without sciatica ? ?Muscle weakness (generalized) ? ?Unsteadiness on feet ? ? ? ? ?Problem List ?Patient Active Problem List  ? Diagnosis Date Noted  ? Decreased GFR 05/10/2021  ? Personal history of colonic polyps 04/07/2021  ? Thyroid nodule 04/07/2021  ? GERD (gastroesophageal reflux disease) 04/07/2021  ? Chronic bilateral low back pain with right-sided sciatica 04/07/2021  ? Scoliosis 04/07/2021  ? 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  ? Osteoarthritis, 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  ? Hypertension 04/07/2021  ? High cholesterol 04/07/2021  ? History of anemia 04/07/2021  ? Body mass index (BMI) of 33.0 to 33.9 in adult 04/07/2021  ? Vitamin D deficiency 04/07/2021  ? OA (osteoarthritis of spine) 01/16/2017  ? S/P lumbar laminectomy 11/02/2016  ? Abnormal CT scan, chest 04/01/2013  ? Upper airway cough syndrome 01/05/2013  ? ? Jearld Lesch, PT ?05/30/2021, 11:25 AM ? ?[place] ?[facility] ?[address] ?[place], [place], [address] ?Phone: [phone]   Fax:  [phone] ? ?Name: Margaret Gordon ?MRN: [id number] ?Date of Birth: 06/15/1940 ? ? ? ?

## 2021-06-01 ENCOUNTER — Ambulatory Visit: Payer: No Typology Code available for payment source | Admitting: Physical Therapy

## 2021-06-01 ENCOUNTER — Other Ambulatory Visit: Payer: Self-pay

## 2021-06-01 DIAGNOSIS — R262 Difficulty in walking, not elsewhere classified: Secondary | ICD-10-CM | POA: Diagnosis not present

## 2021-06-01 DIAGNOSIS — M6281 Muscle weakness (generalized): Secondary | ICD-10-CM

## 2021-06-01 DIAGNOSIS — M545 Low back pain, unspecified: Secondary | ICD-10-CM

## 2021-06-01 NOTE — Therapy (Signed)
[facility] [facility] ?[place] ?[place], [place], [address] ?Phone: [phone]   Fax:  [phone] ? ?Physical Therapy Treatment ? ?Patient Details  ?Name: Margaret Gordon ?MRN: [id number] ?Date of Birth: 1940-09-05 ?Referring Provider (PT): Sherley Bounds ? ? ?Encounter Date: 06/01/2021 ? ? PT End of Session - 06/01/21 1045   ? ? Visit Number 9   ? Date for PT Re-Evaluation 06/29/21   ? PT Start Time 1015   ? PT Stop Time 1055   ? PT Time Calculation (min) 40 min   ? Activity Tolerance Patient tolerated treatment well;No increased pain   ? Behavior During Therapy [facility] for tasks assessed/performed   ? ?  ?  ? ?  ? ? ?Past Medical History:  ?Diagnosis Date  ? Anemia   ? hx  ? Arthritis   ? Asthma   ? GERD (gastroesophageal reflux disease)   ? History of chicken pox   ? History of glaucoma   ? History of hiatal hernia   ? Hyperlipidemia   ? Hypertension   ? Migraines   ? Pneumonia   ? hx  ? Scoliosis   ? ? ?Past Surgical History:  ?Procedure Laterality Date  ? LUMBAR LAMINECTOMY/DECOMPRESSION MICRODISCECTOMY Left 11/02/2016  ? Procedure: Left Lumbar Three-Four, Lumbar Four-Five Laminectomy and Foraminotomy;  Surgeon: Eustace Moore, MD;  Location: [facility];  Service: Neurosurgery;  Laterality: Left;  ? TONSILLECTOMY    ? VESICOVAGINAL FISTULA CLOSURE W/ TAH    ? ? ?There were no vitals filed for this visit. ? ? Subjective Assessment - 06/01/21 1018   ? ? Subjective Pt states that she is doing "pretty good" this morning.   ? Currently in Pain? No/denies   ? ?  ?  ? ?  ? ? ? ? ? ? ? ? ? ? ? ? ? ? ? ? ? ? ? ? [place] Adult PT Treatment/Exercise - 06/01/21 0001   ? ?  ? Lumbar Exercises: Aerobic  ? Nustep 3 x5 min, no UE   ?  ? Lumbar Exercises: Standing  ? Shoulder Extension Power Tower;10 reps   ? Shoulder Extension Limitations x2 sets #5, with ipsilateral foot resting on 6" box   ?  ? Lumbar Exercises: Seated  ? Other Seated Lumbar Exercises pallof press with red TB x10 reps each  direction   ?  ? Lumbar Exercises: Supine  ? Clam 10 reps   ? Clam Limitations x2 sets, single leg- red TB   ? Isometric Hip Flexion 10 reps;2 seconds   ? Isometric Hip Flexion Limitations hooklying with TrA activation   ? Other Supine Lumbar Exercises hooklying BUE pressdown into green physioball x10 reps   ? ?  ?  ? ?  ? ? ? ? ? ? ? ? ? ? PT Education - 06/01/21 1045   ? ? Education Details technique with therex   ? Person(s) Educated Patient   ? Methods Explanation;Verbal cues   ? Comprehension Verbalized understanding;Returned demonstration   ? ?  ?  ? ?  ? ? ? PT Short Term Goals - 05/13/21 0935   ? ?  ? PT SHORT TERM GOAL #1  ? Title I with basic HEP   ? Status Achieved   ? ?  ?  ? ?  ? ? ? ? PT Long Term Goals - 06/01/21 1022   ? ?  ? PT LONG TERM GOAL #1  ? Title I with  final HEP.   ? Baseline continuing to update exercises.   ? Time 7   ? Period Weeks   ? Status On-going   ? Target Date 06/29/21   ?  ? PT LONG TERM GOAL #2  ? Title Increase FOTO score to 51   ? Baseline 44   ? Time 8   ? Period Weeks   ? Status New   ? Target Date 06/29/21   ?  ? PT LONG TERM GOAL #3  ? Title Patient will demosntrate lumbar ROM of at least 80% of full in all planes.   ? Baseline 25-50%   ? Time 8   ? Period Weeks   ? Status New   ? Target Date 06/29/21   ?  ? PT LONG TERM GOAL #4  ? Title Patient will report back pain of <3/10 after performing her normal daily activities Independently.   ? Baseline Continues to experience pain up to 5 or 6/10, overall better   ? Time 7   ? Period Weeks   ? Status Partially Met   ? Target Date 06/29/21   ? ?  ?  ? ?  ? ? ? ? ? ? ? ? Plan - 06/01/21 1045   ? ? Clinical Impression Statement Pt continues to report having ?good? days. She is making progress towards her activity goal, noting max pain of 5 or 6/10 compared to 8/10 at last assessment. Session continued with therex to promote trunk and hip strength. PT monitored Rt shoulder response to new exercises, but pt was able to complete  these without complaints of Rt shoulder discomfort.   ? PT Next Visit Plan progress hip strength; trunk/lat strengthening progressions   ? Consulted and Agree with Plan of Care Patient   ? ?  ?  ? ?  ? ? ?Patient will benefit from skilled therapeutic intervention in order to improve the following deficits and impairments:  Abnormal gait, Decreased range of motion, Difficulty walking, Pain, Decreased activity tolerance, Decreased balance, Decreased mobility, Decreased strength, Impaired sensation, Postural dysfunction, Improper body mechanics, Impaired flexibility ? ?Visit Diagnosis: ?Difficulty in walking, not elsewhere classified ? ?Acute bilateral low back pain without sciatica ? ?Muscle weakness (generalized) ? ? ? ? ?Problem List ?Patient Active Problem List  ? Diagnosis Date Noted  ? Decreased GFR 05/10/2021  ? Personal history of colonic polyps 04/07/2021  ? Thyroid nodule 04/07/2021  ? GERD (gastroesophageal reflux disease) 04/07/2021  ? Chronic bilateral low back pain with right-sided sciatica 04/07/2021  ? Scoliosis 04/07/2021  ? 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  ? Osteoarthritis, 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  ? Hypertension 04/07/2021  ? High cholesterol 04/07/2021  ? History of anemia 04/07/2021  ? Body mass index (BMI) of 33.0 to 33.9 in adult 04/07/2021  ? Vitamin D deficiency 04/07/2021  ? OA (osteoarthritis of spine) 01/16/2017  ? S/P lumbar laminectomy 11/02/2016  ? Abnormal CT scan, chest 04/01/2013  ? Upper airway cough syndrome 01/05/2013  ? ?10:57 AM,06/01/21 ?Sherol Dade PT, DPT ?[facility]  ?[phone] ? ?[place] ?[address] ?[address] ?[place], [place], [address] ?Phone: [phone]   Fax:  [phone] ? ?Name: Margaret Gordon ?MRN: [id number] ?Date of Birth: 1940-07-31 ? ? ? ?

## 2021-06-06 ENCOUNTER — Ambulatory Visit: Payer: No Typology Code available for payment source | Attending: Neurological Surgery | Admitting: Physical Therapy

## 2021-06-06 ENCOUNTER — Encounter: Payer: Self-pay | Admitting: Physical Therapy

## 2021-06-06 DIAGNOSIS — M6281 Muscle weakness (generalized): Secondary | ICD-10-CM | POA: Insufficient documentation

## 2021-06-06 DIAGNOSIS — R2681 Unsteadiness on feet: Secondary | ICD-10-CM | POA: Diagnosis not present

## 2021-06-06 DIAGNOSIS — M545 Low back pain, unspecified: Secondary | ICD-10-CM | POA: Diagnosis not present

## 2021-06-06 DIAGNOSIS — R42 Dizziness and giddiness: Secondary | ICD-10-CM | POA: Insufficient documentation

## 2021-06-06 DIAGNOSIS — R262 Difficulty in walking, not elsewhere classified: Secondary | ICD-10-CM | POA: Diagnosis not present

## 2021-06-06 IMAGING — US US RENAL
1 series · 14 of 25 positions shown · non-contrast
Comparison: Correlation made with lumbar spine MRI 06/17/2020,
05/14/2013

CLINICAL DATA: Renal mass on MRI

EXAM:
RENAL / URINARY TRACT ULTRASOUND COMPLETE

[Series 1: us renal · 0.22mm/px · 14 of 48 slices shown]
[im 1/48]
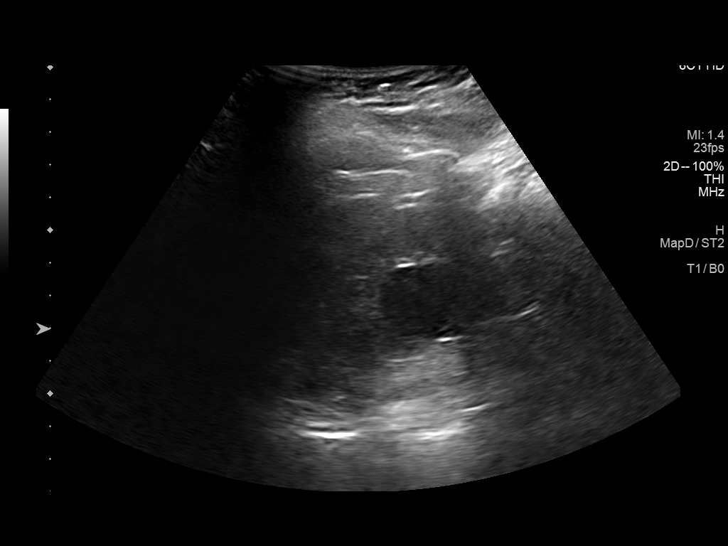
[im 4/48]
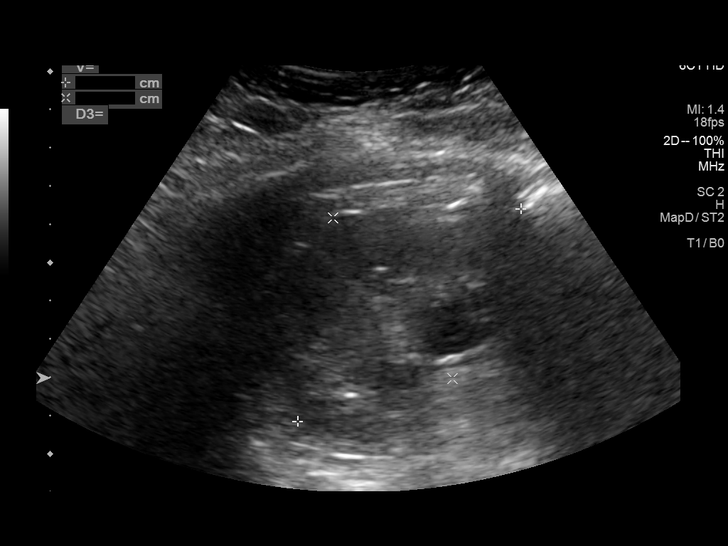
[im 8/48]
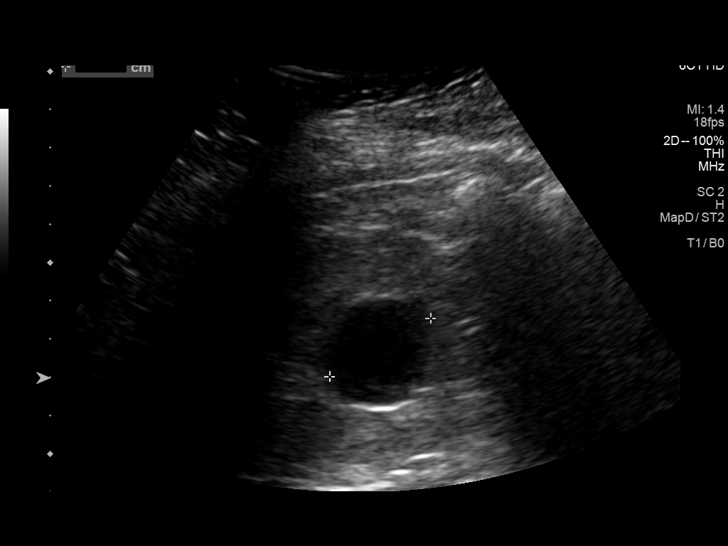
[im 12/48]
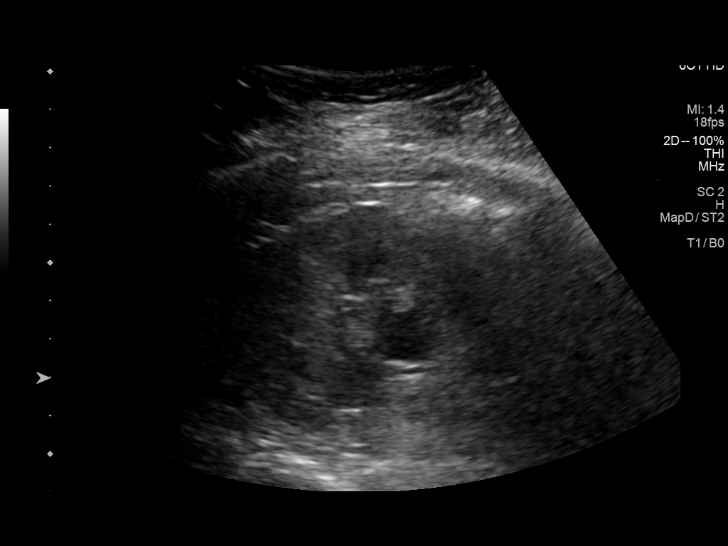
[im 16/48]
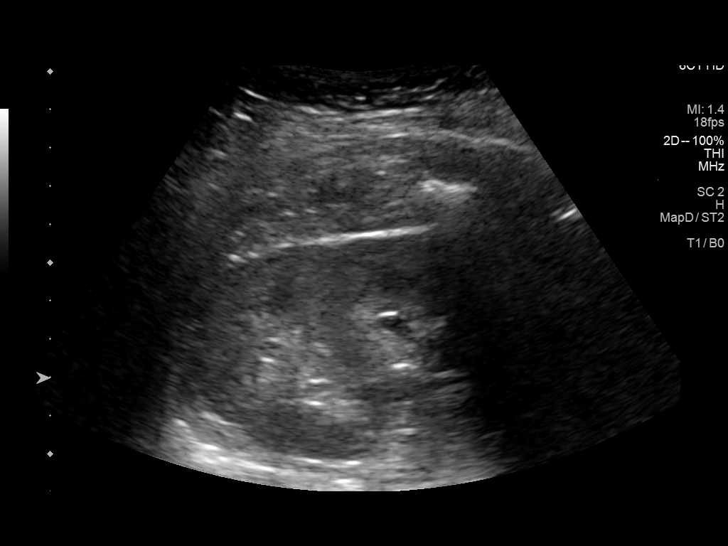
[im 18/48]
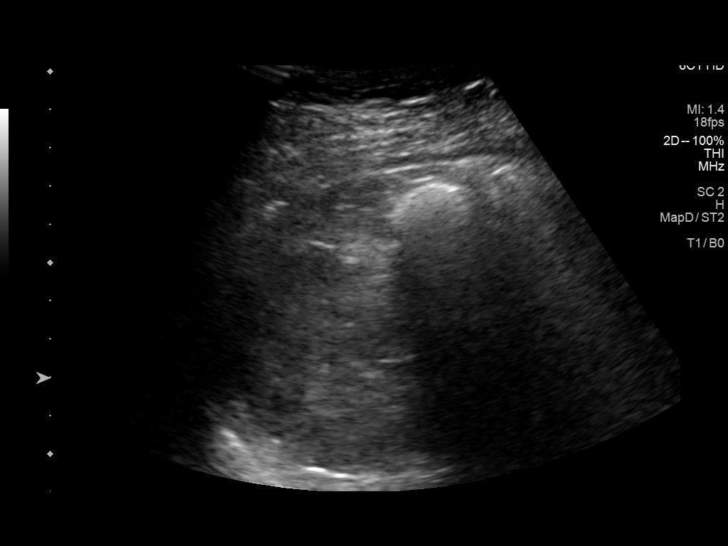
[im 22/48]
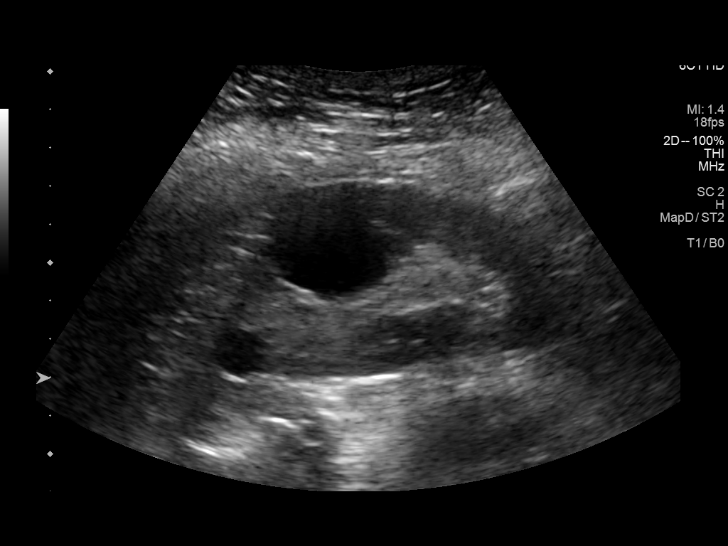
[im 26/48]
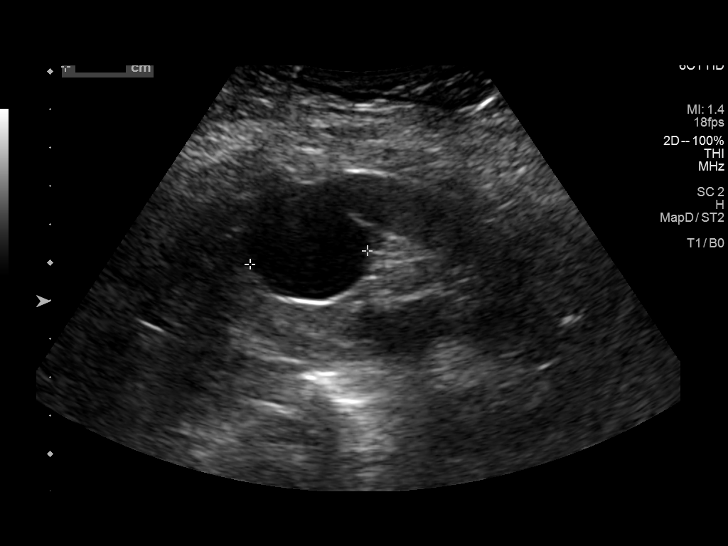
[im 30/48]
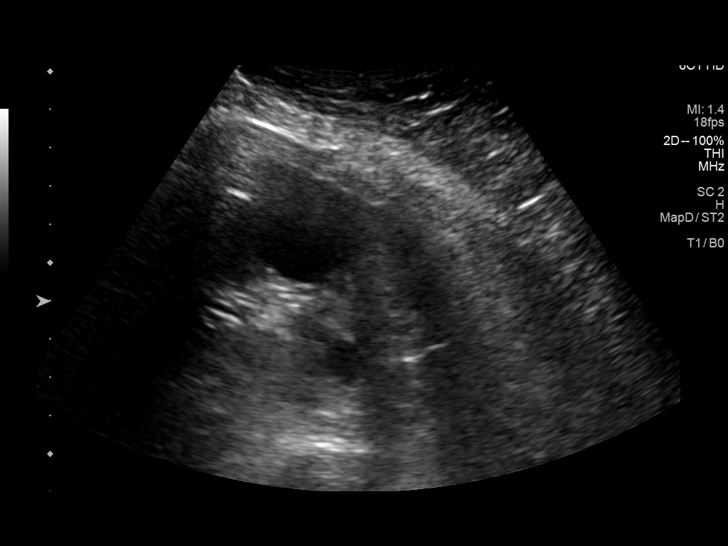
[im 32/48]
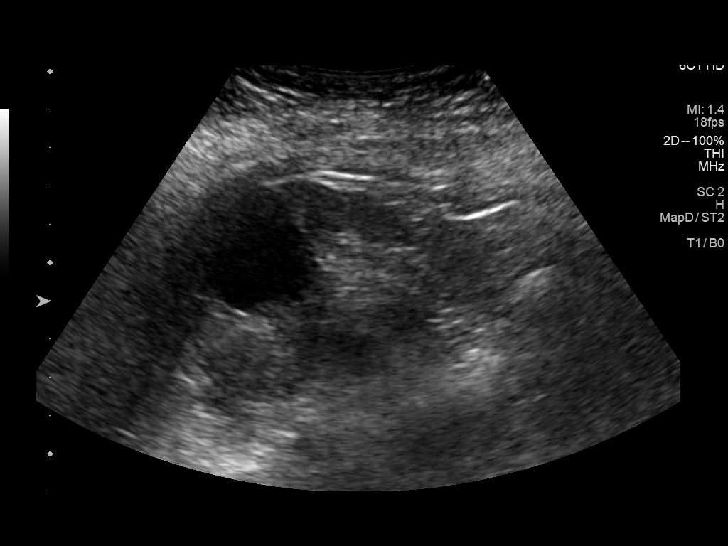
[im 36/48]
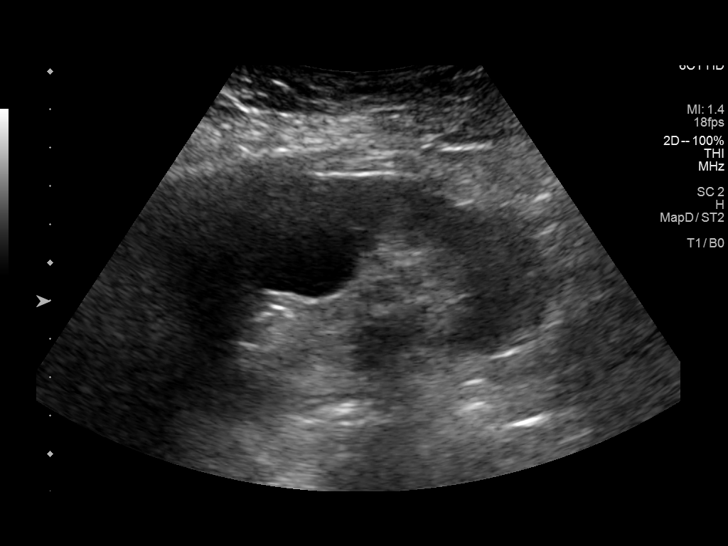
[im 40/48]
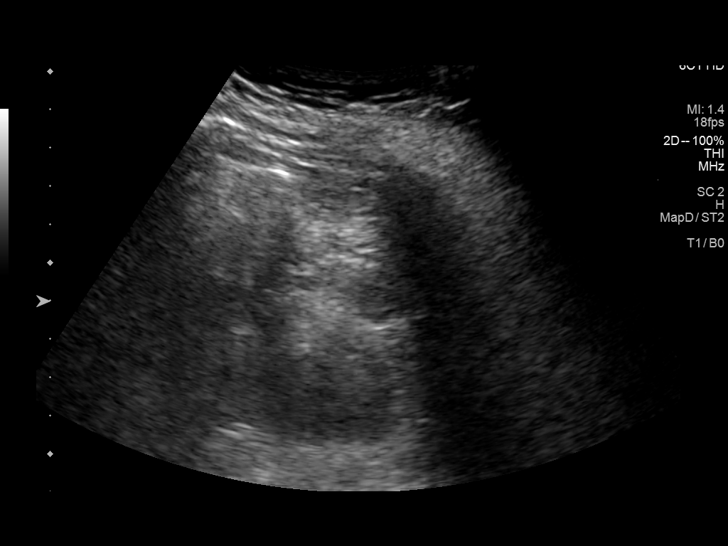
[im 44/48]
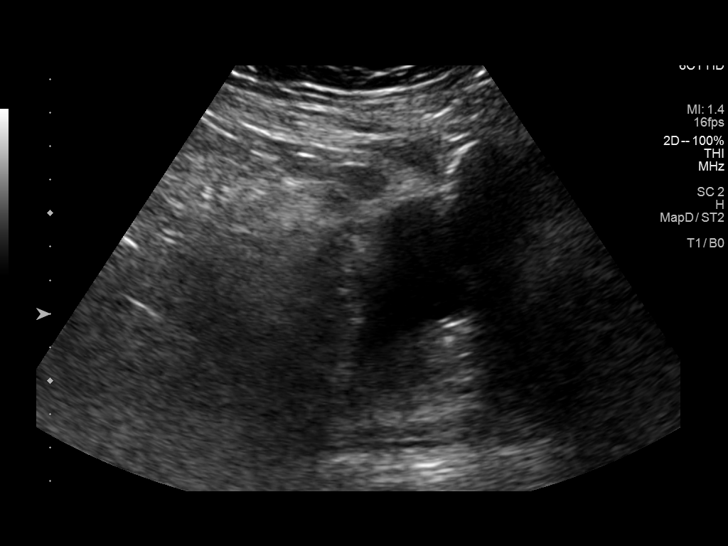
[im 48/48]
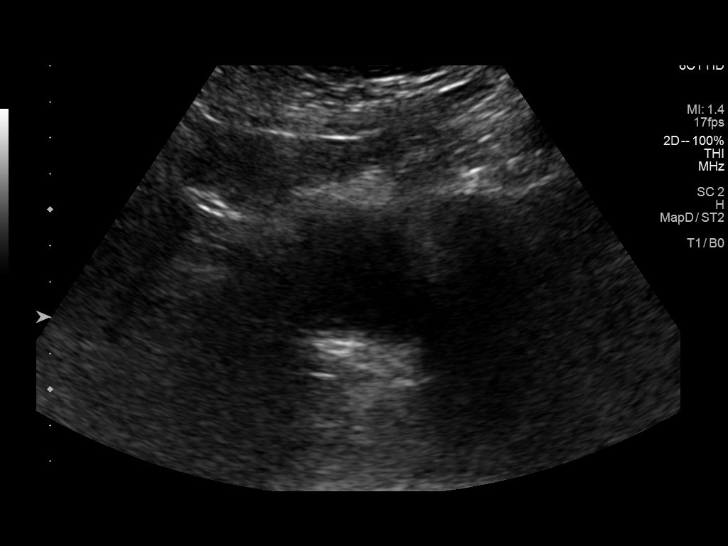

[14 of 25 positions shown; findings below may reference images not displayed]

FINDINGS: Right Kidney:

Renal measurements: 8.1 x 5.2 x 4.6 cm = volume: 101.5 mL.
Echogenicity within normal limits. Interpolar region cyst measuring
3 cm likely corresponding to largest cyst on the MRI. The smaller T2
hypointense lesions are without correlate. No hydronephrosis
visualized.

Left Kidney:

Renal measurements: 9.4 x 5.1 x 5.6 cm = volume: 140.7 mL.
Echogenicity within normal limits. Interpolar region cyst measuring
approximately 3 cm and upper pole cyst measuring approximately 1 cm.
The T2 hypointense lesion at the lower pole is without correlate. No
hydronephrosis visualized.

Bladder:

Appears normal for degree of bladder distention.

Other:

None.
IMPRESSION: Bilateral renal cysts are present. The indeterminate lesions on MRI
are not clearly identified by ultrasound, but were also present on
2626 MRI and likely reflect proteinaceous cysts.

## 2021-06-06 NOTE — Therapy (Signed)
[facility] ?[address] ?[address] ?[place], [place], [address] ?Phone: [phone]   Fax:  [phone] ? ?Physical Therapy Treatment ?Progress Note ?Reporting Period 05/04/21 to 06/06/21 ? ?See note below for Objective Data and Assessment of Progress/Goals.  ? ?  ?Patient Details  ?Name: Margaret Gordon ?MRN: [id number] ?Date of Birth: [birth year] ?Referring Provider (PT): Sherley Bounds ? ? ?Encounter Date: 06/06/2021 ? ? PT End of Session - 06/06/21 1106   ? ? Visit Number 10   ? Date for PT Re-Evaluation 06/29/21   ? PT Start Time 1015   ? PT Stop Time 1056   ? PT Time Calculation (min) 41 min   ? Activity Tolerance Patient tolerated treatment well   ? Behavior During Therapy [facility] for tasks assessed/performed   ? ?  ?  ? ?  ? ? ?Past Medical History:  ?Diagnosis Date  ? Anemia   ? hx  ? Arthritis   ? Asthma   ? GERD (gastroesophageal reflux disease)   ? History of chicken pox   ? History of glaucoma   ? History of hiatal hernia   ? Hyperlipidemia   ? Hypertension   ? Migraines   ? Pneumonia   ? hx  ? Scoliosis   ? ? ?Past Surgical History:  ?Procedure Laterality Date  ? LUMBAR LAMINECTOMY/DECOMPRESSION MICRODISCECTOMY Left 11/02/2016  ? Procedure: Left Lumbar Three-Four, Lumbar Four-Five Laminectomy and Foraminotomy;  Surgeon: Eustace Moore, MD;  Location: [facility];  Service: Neurosurgery;  Laterality: Left;  ? TONSILLECTOMY    ? VESICOVAGINAL FISTULA CLOSURE W/ TAH    ? ? ?There were no vitals filed for this visit. ? ? Subjective Assessment - 06/06/21 1016   ? ? Subjective Patient reports her pain is improving but she still has it every day. It is higher today for some reason. She performed some stretches before even getting out of bed.   ? Pertinent History She is status post L3-4 L4-5 decompressive hemilaminectomy on the left.   ? Currently in Pain? Yes   ? Pain Score 8    ? Pain Location Back   ? Pain Orientation Right;Left   ? Pain Descriptors / Indicators Aching;Tightness;Spasm    ? Pain Type Chronic pain   ? Pain Onset More than a month ago   ? Pain Frequency Constant   ? ?  ?  ? ?  ? ? ? ? ? ? ? ? ? ? ? ? ? ? ? ? ? ? ? ? [facility] Adult PT Treatment/Exercise - 06/06/21 0001   ? ?  ? Lumbar Exercises: Stretches  ? Other Lumbar Stretch Exercise Prone lying on elbows. She had difficulty getting up on her R arm due to shoulder stiffness.   ?  ? Lumbar Exercises: Aerobic  ? Nustep L4 x 6 minutes, no UE   ?  ? Lumbar Exercises: Standing  ? Row Strengthening;Both;20 reps   ? Row Limitations 15#   ? Shoulder Extension Power Tower   ? Shoulder Extension Limitations 2 x 10 reps, 10#   ? Other Standing Lumbar Exercises Paloff press with 10#, 10 reps each direction   ?  ? Lumbar Exercises: Seated  ? Other Seated Lumbar Exercises Seated trunk ext against black theraband. 2 x 10 reps   ? ?  ?  ? ?  ? ? ? ? ? ? ? ? ? ? ? ? PT Short Term Goals - 05/13/21 0935   ? ?  ?  PT SHORT TERM GOAL #1  ? Title I with basic HEP   ? Status Achieved   ? ?  ?  ? ?  ? ? ? ? PT Long Term Goals - 06/06/21 1026   ? ?  ? PT LONG TERM GOAL #1  ? Title I with final HEP.   ? Baseline continuing to update exercises.   ? Time 7   ? Period Weeks   ? Status On-going   ? Target Date 06/29/21   ?  ? PT LONG TERM GOAL #2  ? Title Increase FOTO score to 51   ? Baseline 44   ? Time 8   ? Period Weeks   ? Status On-going   ? Target Date 06/29/21   ?  ? PT LONG TERM GOAL #3  ? Title Patient will demosntrate lumbar ROM of at least 80% of full in all planes.   ? Baseline Met   ? Time 8   ? Period Weeks   ? Status Achieved   ? Target Date 06/29/21   ?  ? PT LONG TERM GOAL #4  ? Title Patient will report back pain of <3/10 after performing her normal daily activities Independently.   ? Baseline Usually < 6/10, but today 8/10   ? Time 7   ? Period Weeks   ? Status On-going   ? Target Date 06/29/21   ? ?  ?  ? ?  ? ? ? ? ? ? ? ? Plan - 06/06/21 1046   ? ? Clinical Impression Statement Patient reports overall improvement, but still having pain, which  is increased today. She is having difficulty consistently performing HEP, but feels that when she does the stretches, it helps. She is progressing toward LTG, but her pain goals have not been met.   ? Personal Factors and Comorbidities Age;Fitness;Comorbidity 3+   ? 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 She is status post L3-4 L4-5 decompressive hemilaminectomy on the left.   ? Examination-Activity Limitations Reach Overhead;Bend;Locomotion Level;Transfers;Hygiene/Grooming;Dressing;Squat;Sleep   ? Examination-Participation Restrictions Cleaning;Meal Prep;Interpersonal Relationship;Laundry   ? Stability/Clinical Decision Making Evolving/Moderate complexity   ? Clinical Decision Making Moderate   ? Rehab Potential Good   ? PT Frequency 2x / week   ? PT Duration Other (comment)   5w  ? PT Treatment/Interventions ADLs/Self Care Home Management;Iontophoresis 42m/ml Dexamethasone;Gait training;Stair training;Functional mobility training;Moist Heat;Traction;Therapeutic activities;Therapeutic exercise;Balance training;Neuromuscular re-education;Manual techniques;Dry needling;Passive range of motion;Patient/family education   ? PT Next Visit Plan progress hip strength; trunk/lat strengthening progressions   ? PT HY[place]  ? Consulted and Agree with Plan of Care Patient   ? ?  ?  ? ?  ? ? ?Patient will benefit from skilled therapeutic intervention in order to improve the following deficits and impairments:  Abnormal gait, Decreased range of motion, Difficulty walking, Pain, Decreased activity tolerance, Decreased balance, Decreased mobility, Decreased strength, Impaired sensation, Postural dysfunction, Improper body mechanics, Impaired flexibility ? ?Visit Diagnosis: ?Difficulty in walking, not elsewhere classified ? ?Muscle weakness (generalized) ? ?Unsteadiness on feet ? ?Acute bilateral low back pain without sciatica ? ? ? ? ?Problem List ?Patient  Active Problem List  ? Diagnosis Date Noted  ? Decreased GFR 05/10/2021  ? Personal history of colonic polyps 04/07/2021  ? Thyroid nodule 04/07/2021  ? GERD (gastroesophageal reflux disease) 04/07/2021  ? Chronic bilateral low back pain with right-sided sciatica 04/07/2021  ? Scoliosis 04/07/2021  ? OA (osteoarthritis) of shoulder  04/07/2021  ? Osteoarthritis, 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  ? Hypertension 04/07/2021  ? High cholesterol 04/07/2021  ? History of anemia 04/07/2021  ? Body mass index (BMI) of 33.0 to 33.9 in adult 04/07/2021  ? Vitamin D deficiency 04/07/2021  ? OA (osteoarthritis of spine) 01/16/2017  ? S/P lumbar laminectomy 11/02/2016  ? Abnormal CT scan, chest 04/01/2013  ? Upper airway cough syndrome 01/05/2013  ? ? ?Marcelina Morel, DPT ?06/06/2021, 11:07 AM ? ?[facility] ?[address] ?[place], [place], [address] ?Phone: [phone]   Fax:  [phone] ? ?Name: ELIETTE DRUMWRIGHT ?MRN: [id number] ?Date of Birth: 1940/07/05 ? ? ? ?

## 2021-06-08 ENCOUNTER — Encounter: Payer: Self-pay | Admitting: Physical Therapy

## 2021-06-08 ENCOUNTER — Ambulatory Visit: Payer: No Typology Code available for payment source | Admitting: Physical Therapy

## 2021-06-08 DIAGNOSIS — R262 Difficulty in walking, not elsewhere classified: Secondary | ICD-10-CM

## 2021-06-08 DIAGNOSIS — M545 Low back pain, unspecified: Secondary | ICD-10-CM

## 2021-06-08 DIAGNOSIS — R2681 Unsteadiness on feet: Secondary | ICD-10-CM

## 2021-06-08 DIAGNOSIS — M6281 Muscle weakness (generalized): Secondary | ICD-10-CM

## 2021-06-08 DIAGNOSIS — R42 Dizziness and giddiness: Secondary | ICD-10-CM

## 2021-06-08 NOTE — Therapy (Signed)
[facility] ?[address] ?[place], [place], [address] ?Phone: [phone]   Fax:  [phone] ? ?Physical Therapy Treatment ? ?Patient Details  ?Name: Margaret Gordon ?MRN: [id number] ?Date of Birth: August 23, 1940 ?Referring Provider (PT): Sherley Bounds ? ? ?Encounter Date: 06/08/2021 ? ? PT End of Session - 06/08/21 1058   ? ? Visit Number 11   ? Date for PT Re-Evaluation 06/29/21   ? PT Start Time 1015   ? PT Stop Time 1057   ? PT Time Calculation (min) 42 min   ? Activity Tolerance Patient tolerated treatment well   ? Behavior During Therapy [facility] for tasks assessed/performed   ? ?  ?  ? ?  ? ? ?Past Medical History:  ?Diagnosis Date  ? Anemia   ? hx  ? Arthritis   ? Asthma   ? GERD (gastroesophageal reflux disease)   ? History of chicken pox   ? History of glaucoma   ? History of hiatal hernia   ? Hyperlipidemia   ? Hypertension   ? Migraines   ? Pneumonia   ? hx  ? Scoliosis   ? ? ?Past Surgical History:  ?Procedure Laterality Date  ? LUMBAR LAMINECTOMY/DECOMPRESSION MICRODISCECTOMY Left 11/02/2016  ? Procedure: Left Lumbar Three-Four, Lumbar Four-Five Laminectomy and Foraminotomy;  Surgeon: Eustace Moore, MD;  Location: [facility];  Service: Neurosurgery;  Laterality: Left;  ? TONSILLECTOMY    ? VESICOVAGINAL FISTULA CLOSURE W/ TAH    ? ? ?There were no vitals filed for this visit. ? ? Subjective Assessment - 06/08/21 1016   ? ? Subjective Patient reports that the feel like a knot in her L anterolateral/proximal knee continues to bother her.   ? Pertinent History She is status post L3-4 L4-5 decompressive hemilaminectomy on the left. Scoliosis   ? Currently in Pain? Yes   ? Pain Score 10-Worst pain ever   ? Pain Location Knee   ? Pain Orientation Left;Proximal;Lateral   ? Pain Descriptors / Indicators Spasm   ? Pain Type Acute pain   ? Pain Onset In the past 7 days   ? Pain Frequency Constant   ? Multiple Pain Sites Yes   ? Pain Score 6   ? Pain Location Back   ? Pain  Orientation Right;Left   ? Pain Descriptors / Indicators Aching;Tightness;Spasm   ? Pain Type Chronic pain   ? Pain Onset More than a month ago   ? Pain Frequency Constant   ? Aggravating Factors  worst in the morning   ? ?  ?  ? ?  ? ? ? ? ? ? ? ? ? ? ? ? ? ? ? ? ? ? ? ? [place] [facility] Adult PT Treatment/Exercise - 06/08/21 0001   ? ?  ? Lumbar Exercises: Stretches  ? Passive Hamstring Stretch Right;Left;1 rep;30 seconds   ? Passive Hamstring Stretch Limitations stretching strap-also stretched add/abd   ?  ? Lumbar Exercises: Aerobic  ? Nustep L5 x 6 minutes   ?  ? Lumbar Exercises: Standing  ? Other Standing Lumbar Exercises B side step against red Tband resistance   ?  ? Lumbar Exercises: Seated  ? Other Seated Lumbar Exercises Sit to stand x 10 while holding 2# weight in BUe.   ?  ? Lumbar Exercises: Supine  ? Bridge Compliant;10 reps;3 seconds   ? Bridge Limitations over physioball   ? Other Supine Lumbar Exercises Bridge with ball roll side to side,  then single knee to chest while stabilizing the ball with opp leg, 10 reps each   ?  ? Manual Therapy  ? Manual Therapy Soft tissue mobilization;Passive ROM   ? Soft tissue mobilization Patient with new soft mass just superior, ant/lateral to L knee. therapsit performed gentle massage to area and retrograde massage.   ? Passive ROM knee flexion   ? ?  ?  ? ?  ? ? ? ? ? ? ? ? ? ? ? ? PT Short Term Goals - 05/13/21 0935   ? ?  ? PT SHORT TERM GOAL #1  ? Title I with basic HEP   ? Status Achieved   ? ?  ?  ? ?  ? ? ? ? PT Long Term Goals - 06/06/21 1026   ? ?  ? PT LONG TERM GOAL #1  ? Title I with final HEP.   ? Baseline continuing to update exercises.   ? Time 7   ? Period Weeks   ? Status On-going   ? Target Date 06/29/21   ?  ? PT LONG TERM GOAL #2  ? Title Increase FOTO score to 51   ? Baseline 44   ? Time 8   ? Period Weeks   ? Status On-going   ? Target Date 06/29/21   ?  ? PT LONG TERM GOAL #3  ? Title Patient will demosntrate lumbar ROM of at least 80% of full in all  planes.   ? Baseline Met   ? Time 8   ? Period Weeks   ? Status Achieved   ? Target Date 06/29/21   ?  ? PT LONG TERM GOAL #4  ? Title Patient will report back pain of <3/10 after performing her normal daily activities Independently.   ? Baseline Usually < 6/10, but today 8/10   ? Time 7   ? Period Weeks   ? Status On-going   ? Target Date 06/29/21   ? ?  ?  ? ?  ? ? ? ? ? ? ? ? Plan - 06/08/21 1044   ? ? Clinical Impression Statement Patient reports that her L knee pain has continued. The area is swollen and taught. Therapist performed STM to the area, retrograde massage, and then gentle stretch into flexion. Patient continued trunk and LE stretch and strengthening for pain relief in back. Tolerated all activities wihtout increased pain.   ? Personal Factors and Comorbidities Age;Fitness;Comorbidity 3+   ? 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 She is status post L3-4 L4-5 decompressive hemilaminectomy on the left.   ? Examination-Activity Limitations Reach Overhead;Bend;Locomotion Level;Transfers;Hygiene/Grooming;Dressing;Squat;Sleep   ? Examination-Participation Restrictions Cleaning;Meal Prep;Interpersonal Relationship;Laundry   ? Stability/Clinical Decision Making Evolving/Moderate complexity   ? Clinical Decision Making Moderate   ? Rehab Potential Good   ? PT Frequency 2x / week   ? PT Treatment/Interventions ADLs/Self Care Home Management;Iontophoresis 20m/ml Dexamethasone;Gait training;Stair training;Functional mobility training;Moist Heat;Traction;Therapeutic activities;Therapeutic exercise;Balance training;Neuromuscular re-education;Manual techniques;Dry needling;Passive range of motion;Patient/family education   ? PT Next Visit Plan progress hip strength; trunk/lat strengthening progressions   ? PT H[place]  ? Consulted and Agree with Plan of Care Patient   ? ?  ?  ? ?  ? ? ?Patient will benefit from skilled therapeutic intervention  in order to improve the following deficits and impairments:  Abnormal gait, Decreased range of motion, Difficulty walking, Pain, Decreased activity tolerance, Decreased balance, Decreased mobility, Decreased strength,  Impaired sensation, Postural dysfunction, Improper body mechanics, Impaired flexibility ? ?Visit Diagnosis: ?Difficulty in walking, not elsewhere classified ? ?Muscle weakness (generalized) ? ?Unsteadiness on feet ? ?Acute bilateral low back pain without sciatica ? ?Dizziness and giddiness ? ? ? ? ?Problem List ?Patient Active Problem List  ? Diagnosis Date Noted  ? Decreased GFR 05/10/2021  ? Personal history of colonic polyps 04/07/2021  ? Thyroid nodule 04/07/2021  ? GERD (gastroesophageal reflux disease) 04/07/2021  ? Chronic bilateral low back pain with right-sided sciatica 04/07/2021  ? Scoliosis 04/07/2021  ? 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  ? Osteoarthritis, 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  ? Hypertension 04/07/2021  ? High cholesterol 04/07/2021  ? History of anemia 04/07/2021  ? Body mass index (BMI) of 33.0 to 33.9 in adult 04/07/2021  ? Vitamin D deficiency 04/07/2021  ? OA (osteoarthritis of spine) 01/16/2017  ? S/P lumbar laminectomy 11/02/2016  ? Abnormal CT scan, chest 04/01/2013  ? Upper airway cough syndrome 01/05/2013  ? ? ?Marcelina Morel, DPT ?06/08/2021, 10:59 AM ? ?[facility] ?[address] ?[address] ?[place], [place], [address] ?Phone: [phone]   Fax:  [phone] ? ?Name: Margaret Gordon ?MRN: [id number] ?Date of Birth: 1940/12/07 ? ? ? ?

## 2021-06-17 ENCOUNTER — Encounter: Payer: Self-pay | Admitting: Physical Therapy

## 2021-06-17 ENCOUNTER — Ambulatory Visit: Payer: No Typology Code available for payment source | Admitting: Physical Therapy

## 2021-06-17 DIAGNOSIS — R262 Difficulty in walking, not elsewhere classified: Secondary | ICD-10-CM

## 2021-06-17 DIAGNOSIS — R2681 Unsteadiness on feet: Secondary | ICD-10-CM

## 2021-06-17 DIAGNOSIS — M545 Low back pain, unspecified: Secondary | ICD-10-CM

## 2021-06-17 DIAGNOSIS — R42 Dizziness and giddiness: Secondary | ICD-10-CM

## 2021-06-17 DIAGNOSIS — M6281 Muscle weakness (generalized): Secondary | ICD-10-CM

## 2021-06-17 NOTE — Therapy (Signed)
[facility] ?[address] ?[place], [place], [address] ?Phone: [phone]   Fax:  [phone] ? ?Physical Therapy Treatment ? ?Patient Details  ?Name: Margaret Gordon ?MRN: [id number] ?Date of Birth: Oct 10, 1940 ?Referring Provider (PT): Sherley Bounds ? ? ?Encounter Date: 06/17/2021 ? ? PT End of Session - 06/17/21 1101   ? ? Visit Number 12   ? Date for PT Re-Evaluation 06/29/21   ? PT Start Time 1017   ? PT Stop Time 1059   ? PT Time Calculation (min) 42 min   ? Activity Tolerance Patient tolerated treatment well   ? Behavior During Therapy [facility] for tasks assessed/performed   ? ?  ?  ? ?  ? ? ?Past Medical History:  ?Diagnosis Date  ? Anemia   ? hx  ? Arthritis   ? Asthma   ? GERD (gastroesophageal reflux disease)   ? History of chicken pox   ? History of glaucoma   ? History of hiatal hernia   ? Hyperlipidemia   ? Hypertension   ? Migraines   ? Pneumonia   ? hx  ? Scoliosis   ? ? ?Past Surgical History:  ?Procedure Laterality Date  ? LUMBAR LAMINECTOMY/DECOMPRESSION MICRODISCECTOMY Left 11/02/2016  ? Procedure: Left Lumbar Three-Four, Lumbar Four-Five Laminectomy and Foraminotomy;  Surgeon: Eustace Moore, MD;  Location: [facility] [place];  Service: Neurosurgery;  Laterality: Left;  ? TONSILLECTOMY    ? VESICOVAGINAL FISTULA CLOSURE W/ TAH    ? ? ?There were no vitals filed for this visit. ? ? Subjective Assessment - 06/17/21 1021   ? ? Subjective Sometimes my low back is getting better but sometimes it's not. I'm trying to get back into my routine without pain but it's not letting me. I have the most problems when I'm standing up for a long time. Patient rates L knee a 6/10 pain but we're focusing on the back.   ? Pertinent History She is status post L3-4 L4-5 decompressive hemilaminectomy on the left. Scoliosis   ? Limitations Lifting;Standing;Walking;House hold activities   ? Patient Stated Goals Be able to take care of herself and move without hurting.   ? Currently in Pain?  Yes   ? Pain Score 6    ? Pain Location Back   ? Pain Orientation Lower   ? Pain Descriptors / Indicators Constant   ? Pain Type Acute pain   ? Pain Frequency Constant   ? ?  ?  ? ?  ? ? ? ? ? ? ? ? ? ? ? ? ? ? ? ? ? ? ? ? [facility] Adult PT Treatment/Exercise - 06/17/21 0001   ? ?  ? Lumbar Exercises: Stretches  ? Passive Hamstring Stretch Right;Left;3 reps;30 seconds   ? Double Knee to Chest Stretch 3 reps;30 seconds   ?  ? Lumbar Exercises: Aerobic  ? Recumbent Bike Lvl 2 x 6 mins   ?  ? Lumbar Exercises: Machines for Strengthening  ? Other Lumbar Machine Exercise lat pulls 10x 15#   ?  ? Lumbar Exercises: Standing  ? Other Standing Lumbar Exercises 3# cane shoulder flexion w/ trunk ext.   attemped shoulder flexion w/ 3# cane but unable due to ROM.  ? Other Standing Lumbar Exercises shoulder ext and rows 2x10 both UE, red TB   ?  ? Lumbar Exercises: Seated  ? Other Seated Lumbar Exercises STS on airex x10   w/ UE  ?  ? Lumbar Exercises:  Supine  ? Bridge 15 reps;Compliant;3 seconds;2 seconds   2 sets w/ blue yoga ball.  ? Other Supine Lumbar Exercises trunk rotation w/ blue yoga ball 2x10 each side   ? ?  ?  ? ?  ? ? ? ? ? ? ? ? ? ? ? ? PT Short Term Goals - 05/13/21 0935   ? ?  ? PT SHORT TERM GOAL #1  ? Title I with basic HEP   ? Status Achieved   ? ?  ?  ? ?  ? ? ? ? PT Long Term Goals - 06/06/21 1026   ? ?  ? PT LONG TERM GOAL #1  ? Title I with final HEP.   ? Baseline continuing to update exercises.   ? Time 7   ? Period Weeks   ? Status On-going   ? Target Date 06/29/21   ?  ? PT LONG TERM GOAL #2  ? Title Increase FOTO score to 51   ? Baseline 44   ? Time 8   ? Period Weeks   ? Status On-going   ? Target Date 06/29/21   ?  ? PT LONG TERM GOAL #3  ? Title Patient will demosntrate lumbar ROM of at least 80% of full in all planes.   ? Baseline Met   ? Time 8   ? Period Weeks   ? Status Achieved   ? Target Date 06/29/21   ?  ? PT LONG TERM GOAL #4  ? Title Patient will report back pain of <3/10 after performing her  normal daily activities Independently.   ? Baseline Usually < 6/10, but today 8/10   ? Time 7   ? Period Weeks   ? Status On-going   ? Target Date 06/29/21   ? ?  ?  ? ?  ? ? ? ? ? ? ? ? Plan - 06/17/21 1101   ? ? Clinical Impression Statement Patient stated she had pain in her L knee but focused today's session on low back pain. Focused on stretching and strengthening back. Attempted shoulder flexion w/ 3# weight and cane but was unable due to limited ROM of R shoulder. She did well overall. VC's needed for correct posture w/ shoulder ext, rows, and lat pulls.   ? Personal Factors and Comorbidities Age;Fitness;Comorbidity 3+   ? 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 She is status post L3-4 L4-5 decompressive hemilaminectomy on the left.   ? Examination-Activity Limitations Reach Overhead;Bend;Locomotion Level;Transfers;Hygiene/Grooming;Dressing;Squat;Sleep   ? Examination-Participation Restrictions Cleaning;Meal Prep;Interpersonal Relationship;Laundry   ? Stability/Clinical Decision Making Evolving/Moderate complexity   ? Clinical Decision Making Moderate   ? Rehab Potential Good   ? PT Frequency 2x / week   ? PT Duration Other (comment)   ? PT Treatment/Interventions ADLs/Self Care Home Management;Iontophoresis 26m/ml Dexamethasone;Gait training;Stair training;Functional mobility training;Moist Heat;Traction;Therapeutic activities;Therapeutic exercise;Balance training;Neuromuscular re-education;Manual techniques;Dry needling;Passive range of motion;Patient/family education   ? PT Next Visit Plan Progress as tolerated.   ? PT [place]  ? Consulted and Agree with Plan of Care Patient   ? ?  ?  ? ?  ? ? ?Patient will benefit from skilled therapeutic intervention in order to improve the following deficits and impairments:  Abnormal gait, Decreased range of motion, Difficulty walking, Pain, Decreased activity tolerance, Decreased balance,  Decreased mobility, Decreased strength, Impaired sensation, Postural dysfunction, Improper body mechanics, Impaired flexibility ? ?Visit Diagnosis: ?Difficulty in walking, not elsewhere classified ? ?  Muscle weakness (generalized) ? ?Unsteadiness on feet ? ?Acute bilateral low back pain without sciatica ? ?Dizziness and giddiness ? ? ? ? ?Problem List ?Patient Active Problem List  ? Diagnosis Date Noted  ? Decreased GFR 05/10/2021  ? Personal history of colonic polyps 04/07/2021  ? Thyroid nodule 04/07/2021  ? GERD (gastroesophageal reflux disease) 04/07/2021  ? Chronic bilateral low back pain with right-sided sciatica 04/07/2021  ? Scoliosis 04/07/2021  ? 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  ? Osteoarthritis, 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  ? Hypertension 04/07/2021  ? High cholesterol 04/07/2021  ? History of anemia 04/07/2021  ? Body mass index (BMI) of 33.0 to 33.9 in adult 04/07/2021  ? Vitamin D deficiency 04/07/2021  ? OA (osteoarthritis of spine) 01/16/2017  ? S/P lumbar laminectomy 11/02/2016  ? Abnormal CT scan, chest 04/01/2013  ? Upper airway cough syndrome 01/05/2013  ? ? ?Dquan Cortopassi Chauncey Cruel ?06/17/2021, 11:06 AM ? ?[facility] [place] ?[address] ?[address] ?[place], [place], [address] ?Phone: [phone]   Fax:  [phone] ? ?Name: Margaret Gordon ?MRN: [id number] ?Date of Birth: Jul 11, 1940 ? ? ? ?

## 2021-06-21 ENCOUNTER — Ambulatory Visit: Payer: No Typology Code available for payment source | Admitting: Physical Therapy

## 2021-06-21 ENCOUNTER — Encounter: Payer: Self-pay | Admitting: Physical Therapy

## 2021-06-21 DIAGNOSIS — R262 Difficulty in walking, not elsewhere classified: Secondary | ICD-10-CM | POA: Diagnosis not present

## 2021-06-21 DIAGNOSIS — M545 Low back pain, unspecified: Secondary | ICD-10-CM

## 2021-06-21 DIAGNOSIS — M6281 Muscle weakness (generalized): Secondary | ICD-10-CM

## 2021-06-21 NOTE — Therapy (Signed)
?[facility] ?[address] ?[place], [place], [address] ?Phone: [phone]   Fax:  [phone] ? ?Physical Therapy Treatment ? ?Patient Details  ?Name: Margaret Gordon ?MRN: [id number] ?Date of Birth: 07-08-1940 ?Referring Provider (PT): Sherley Bounds ? ? ?Encounter Date: 06/21/2021 ? ? PT End of Session - 06/21/21 1014   ? ? Visit Number 13   ? Date for PT Re-Evaluation 06/29/21   ? PT Start Time 0930   ? PT Stop Time 1013   ? PT Time Calculation (min) 43 min   ? Activity Tolerance Patient tolerated treatment well   ? Behavior During Therapy [facility] for tasks assessed/performed   ? ?  ?  ? ?  ? ? ?Past Medical History:  ?Diagnosis Date  ? Anemia   ? hx  ? Arthritis   ? Asthma   ? GERD (gastroesophageal reflux disease)   ? History of chicken pox   ? History of glaucoma   ? History of hiatal hernia   ? Hyperlipidemia   ? Hypertension   ? Migraines   ? Pneumonia   ? hx  ? Scoliosis   ? ? ?Past Surgical History:  ?Procedure Laterality Date  ? LUMBAR LAMINECTOMY/DECOMPRESSION MICRODISCECTOMY Left 11/02/2016  ? Procedure: Left Lumbar Three-Four, Lumbar Four-Five Laminectomy and Foraminotomy;  Surgeon: Eustace Moore, MD;  Location: [facility];  Service: Neurosurgery;  Laterality: Left;  ? TONSILLECTOMY    ? VESICOVAGINAL FISTULA CLOSURE W/ TAH    ? ? ?There were no vitals filed for this visit. ? ? Subjective Assessment - 06/21/21 0943   ? ? Subjective I'm feeling a lot better in my back and L knee. I feel better after last session. Not sure what was going on yesterday I woke with so much pain.   ? Pertinent History She is status post L3-4 L4-5 decompressive hemilaminectomy on the left. Scoliosis   ? Limitations Lifting;Standing;Walking;House hold activities   ? Currently in Pain? Yes   ? Pain Score 5    ? Pain Location Back   ? Pain Orientation Lower   ? Pain Descriptors / Indicators Constant   ? ?  ?  ? ?  ? ? ? ? ? ? ? ? ? ? ? ? ? ? ? ? ? ? ? ? [facility] Adult PT Treatment/Exercise -  06/21/21 0001   ? ?  ? Lumbar Exercises: Stretches  ? Passive Hamstring Stretch Right;Left;2 reps;20 seconds   ? Double Knee to Chest Stretch 3 reps;30 seconds   ?  ? Lumbar Exercises: Aerobic  ? UBE (Upper Arm Bike) lvl 2 x5 mins 2.5 mins fwd and 2.5 backwards. Only LE, unable to do w/ right UE. Nustep and recumbent bike were taken.   ?  ? Lumbar Exercises: Machines for Strengthening  ? Other Lumbar Machine Exercise lat pulls 10x 15#   ?  ? Lumbar Exercises: Standing  ? Other Standing Lumbar Exercises 2# cane shoulder flex with trunk ext 2x10   ? Other Standing Lumbar Exercises shoulder ext and rows red TB 2x15   ?  ? Lumbar Exercises: Seated  ? Other Seated Lumbar Exercises STS on airex 2x10, LOB with first rep.   ?  ? Lumbar Exercises: Supine  ? Bridge Compliant   ? Bridge with clamshell Compliant;10 reps;3 seconds   ? Bridge with [employer] Limitations 2 sets w/ red TB   ? ?  ?  ? ?  ? ? ? ? ? ? ? ? ? ? ? ?  PT Short Term Goals - 05/13/21 0935   ? ?  ? PT SHORT TERM GOAL #1  ? Title I with basic HEP   ? Status Achieved   ? ?  ?  ? ?  ? ? ? ? PT Long Term Goals - 06/06/21 1026   ? ?  ? PT LONG TERM GOAL #1  ? Title I with final HEP.   ? Baseline continuing to update exercises.   ? Time 7   ? Period Weeks   ? Status On-going   ? Target Date 06/29/21   ?  ? PT LONG TERM GOAL #2  ? Title Increase FOTO score to 51   ? Baseline 44   ? Time 8   ? Period Weeks   ? Status On-going   ? Target Date 06/29/21   ?  ? PT LONG TERM GOAL #3  ? Title Patient will demosntrate lumbar ROM of at least 80% of full in all planes.   ? Baseline Met   ? Time 8   ? Period Weeks   ? Status Achieved   ? Target Date 06/29/21   ?  ? PT LONG TERM GOAL #4  ? Title Patient will report back pain of <3/10 after performing her normal daily activities Independently.   ? Baseline Usually < 6/10, but today 8/10   ? Time 7   ? Period Weeks   ? Status On-going   ? Target Date 06/29/21   ? ?  ?  ? ?  ? ? ? ? ? ? ? ? Plan - 06/21/21 1015   ? ? Clinical  Impression Statement Patient stated her L knee and back doesn't hurt as bad today, they've been getting better after last session. LOB after first rep w/ STS on airex but recovered and did well afterwards. Focused on stretching and strengthening back. VC's needed for correct posture w/ rows.   ? Personal Factors and Comorbidities Age;Fitness;Comorbidity 3+   ? 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 She is status post L3-4 L4-5 decompressive hemilaminectomy on the left.   ? Examination-Activity Limitations Reach Overhead;Bend;Locomotion Level;Transfers;Hygiene/Grooming;Dressing;Squat;Sleep   ? Examination-Participation Restrictions Cleaning;Meal Prep;Interpersonal Relationship;Laundry   ? Stability/Clinical Decision Making Evolving/Moderate complexity   ? Clinical Decision Making Moderate   ? Rehab Potential Good   ? PT Frequency 2x / week   ? PT Duration Other (comment)   ? PT Treatment/Interventions ADLs/Self Care Home Management;Iontophoresis 25m/ml Dexamethasone;Gait training;Stair training;Functional mobility training;Moist Heat;Traction;Therapeutic activities;Therapeutic exercise;Balance training;Neuromuscular re-education;Manual techniques;Dry needling;Passive range of motion;Patient/family education   ? PT Next Visit Plan Progress as tolerated.   ? PT [place]  ? Consulted and Agree with Plan of Care Patient   ? ?  ?  ? ?  ? ? ?Patient will benefit from skilled therapeutic intervention in order to improve the following deficits and impairments:  Abnormal gait, Decreased range of motion, Difficulty walking, Pain, Decreased activity tolerance, Decreased balance, Decreased mobility, Decreased strength, Impaired sensation, Postural dysfunction, Improper body mechanics, Impaired flexibility ? ?Visit Diagnosis: ?No diagnosis found. ? ? ? ? ?Problem List ?Patient Active Problem List  ? Diagnosis Date Noted  ? Decreased GFR 05/10/2021  ?  Personal history of colonic polyps 04/07/2021  ? Thyroid nodule 04/07/2021  ? GERD (gastroesophageal reflux disease) 04/07/2021  ? Chronic bilateral low back pain with right-sided sciatica 04/07/2021  ? Scoliosis 04/07/2021  ? 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  ? Osteoarthritis, 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  ?  Hypertension 04/07/2021  ? High cholesterol 04/07/2021  ? History of anemia 04/07/2021  ? Body mass index (BMI) of 33.0 to 33.9 in adult 04/07/2021  ? Vitamin D deficiency 04/07/2021  ? OA (osteoarthritis of spine) 01/16/2017  ? S/P lumbar laminectomy 11/02/2016  ? Abnormal CT scan, chest 04/01/2013  ? Upper airway cough syndrome 01/05/2013  ? ? ?Margaret Gordon ?06/21/2021, 10:20 AM ? ?[facility] ?[address] ?[address] ?[place], [place], [address] ?Phone: [phone]   Fax:  [phone] ? ?Name: Margaret Gordon ?MRN: [id number] ?Date of Birth: April 12, 1940 ? ? ? ?

## 2021-06-24 ENCOUNTER — Encounter: Payer: Self-pay | Admitting: Physical Therapy

## 2021-06-24 ENCOUNTER — Ambulatory Visit: Payer: No Typology Code available for payment source | Admitting: Physical Therapy

## 2021-06-24 DIAGNOSIS — R262 Difficulty in walking, not elsewhere classified: Secondary | ICD-10-CM

## 2021-06-24 DIAGNOSIS — M6281 Muscle weakness (generalized): Secondary | ICD-10-CM

## 2021-06-24 DIAGNOSIS — R42 Dizziness and giddiness: Secondary | ICD-10-CM

## 2021-06-24 NOTE — Therapy (Signed)
[facility] ?[place] ?[address] ?[place], [place], [address] ?Phone: [phone]   Fax:  [phone] ? ?Physical Therapy Treatment ? ?Patient Details  ?Name: Margaret Gordon ?MRN: [id number] ?Date of Birth: [birth year] ?Referring Provider (PT): Sherley Bounds ? ? ?Encounter Date: 06/24/2021 ? ? PT End of Session - 06/24/21 0936   ? ? Visit Number 14   ? Date for PT Re-Evaluation 06/29/21   ? PT Start Time 0845   ? PT Stop Time 0277   ? PT Time Calculation (min) 42 min   ? Activity Tolerance Patient tolerated treatment well   ? Behavior During Therapy [facility] for tasks assessed/performed   ? ?  ?  ? ?  ? ? ?Past Medical History:  ?Diagnosis Date  ? Anemia   ? hx  ? Arthritis   ? Asthma   ? GERD (gastroesophageal reflux disease)   ? History of chicken pox   ? History of glaucoma   ? History of hiatal hernia   ? Hyperlipidemia   ? Hypertension   ? Migraines   ? Pneumonia   ? hx  ? Scoliosis   ? ? ?Past Surgical History:  ?Procedure Laterality Date  ? LUMBAR LAMINECTOMY/DECOMPRESSION MICRODISCECTOMY Left 11/02/2016  ? Procedure: Left Lumbar Three-Four, Lumbar Four-Five Laminectomy and Foraminotomy;  Surgeon: Eustace Moore, MD;  Location: [facility];  Service: Neurosurgery;  Laterality: Left;  ? TONSILLECTOMY    ? VESICOVAGINAL FISTULA CLOSURE W/ TAH    ? ? ?There were no vitals filed for this visit. ? ? Subjective Assessment - 06/24/21 0849   ? ? Subjective I'm feeling good today, I forgot my cane so let's see how today goes.   ? Pertinent History She is status post L3-4 L4-5 decompressive hemilaminectomy on the left. Scoliosis   ? Limitations Lifting;Standing;Walking;House hold activities   ? Patient Stated Goals Be able to take care of herself and move without hurting.   ? Currently in Pain? Yes   ? Pain Score 6    ? Pain Location Back   ? Pain Orientation Lower   ? Pain Descriptors / Indicators Constant   ? ?  ?  ? ?  ? ? ? ? ? ? ? ? ? ? ? ? ? ? ? ? ? ? ? ? [facility] Adult PT  Treatment/Exercise - 06/24/21 0001   ? ?  ? Lumbar Exercises: Aerobic  ? Recumbent Bike lvl 2.5 x 28mns   ?  ? Lumbar Exercises: Machines for Strengthening  ? Other Lumbar Machine Exercise lat pulls 15# 2x10   ? Other Lumbar Machine Exercise rows 2x10 20#   ?  ? Lumbar Exercises: Standing  ? Other Standing Lumbar Exercises fwd walk on sports cord x4 #20   ?  ? Lumbar Exercises: Seated  ? Other Seated Lumbar Exercises STS on airex 2x10 w/ UE   ?  ? Lumbar Exercises: Supine  ? Bridge with March 10 reps   ? Bridge with [employer] on blue yoga ball   ? Other Supine Lumbar Exercises bridge hold on blue yoga ball 30 secs 2x   ? Other Supine Lumbar Exercises trunk rotation boths sides on blue yoga ball 2x10   ? ?  ?  ? ?  ? ? ? ? ? ? ? ? ? ? ? ? PT Short Term Goals - 05/13/21 0935   ? ?  ? PT SHORT TERM GOAL #1  ? Title I with basic  HEP   ? Status Achieved   ? ?  ?  ? ?  ? ? ? ? PT Long Term Goals - 06/06/21 1026   ? ?  ? PT LONG TERM GOAL #1  ? Title I with final HEP.   ? Baseline continuing to update exercises.   ? Time 7   ? Period Weeks   ? Status On-going   ? Target Date 06/29/21   ?  ? PT LONG TERM GOAL #2  ? Title Increase FOTO score to 51   ? Baseline 44   ? Time 8   ? Period Weeks   ? Status On-going   ? Target Date 06/29/21   ?  ? PT LONG TERM GOAL #3  ? Title Patient will demosntrate lumbar ROM of at least 80% of full in all planes.   ? Baseline Met   ? Time 8   ? Period Weeks   ? Status Achieved   ? Target Date 06/29/21   ?  ? PT LONG TERM GOAL #4  ? Title Patient will report back pain of <3/10 after performing her normal daily activities Independently.   ? Baseline Usually < 6/10, but today 8/10   ? Time 7   ? Period Weeks   ? Status On-going   ? Target Date 06/29/21   ? ?  ?  ? ?  ? ? ? ? ? ? ? ? Plan - 06/24/21 0915   ? ? Clinical Impression Statement Patient came in feeling good. Complained of dizziness on rows due to not eating. Focused on strengthening and balance. She had increased pain w/  fwd walking on sports cord so ended session w/ stretching to help relieve pain. VC's needed for correct posture on rows and marches w/ bridge on blue yoga ball.   ? Personal Factors and Comorbidities Age;Fitness;Comorbidity 3+   ? 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 She is status post L3-4 L4-5 decompressive hemilaminectomy on the left.   ? Examination-Activity Limitations Reach Overhead;Bend;Locomotion Level;Transfers;Hygiene/Grooming;Dressing;Squat;Sleep   ? Examination-Participation Restrictions Cleaning;Meal Prep;Interpersonal Relationship;Laundry   ? Stability/Clinical Decision Making Evolving/Moderate complexity   ? Clinical Decision Making Moderate   ? Rehab Potential Good   ? PT Frequency 2x / week   ? PT Duration Other (comment)   ? PT Treatment/Interventions ADLs/Self Care Home Management;Iontophoresis 4m/ml Dexamethasone;Gait training;Stair training;Functional mobility training;Moist Heat;Traction;Therapeutic activities;Therapeutic exercise;Balance training;Neuromuscular re-education;Manual techniques;Dry needling;Passive range of motion;Patient/family education   ? PT Next Visit Plan Progress as tolerated.   ? PT [place]  ? Consulted and Agree with Plan of Care Patient   ? ?  ?  ? ?  ? ? ?Patient will benefit from skilled therapeutic intervention in order to improve the following deficits and impairments:  Abnormal gait, Decreased range of motion, Difficulty walking, Pain, Decreased activity tolerance, Decreased balance, Decreased mobility, Decreased strength, Impaired sensation, Postural dysfunction, Improper body mechanics, Impaired flexibility ? ?Visit Diagnosis: ?Difficulty in walking, not elsewhere classified ? ?Muscle weakness (generalized) ? ?Dizziness and giddiness ? ? ? ? ?Problem List ?Patient Active Problem List  ? Diagnosis Date Noted  ? Decreased GFR 05/10/2021  ? Personal history of colonic polyps 04/07/2021  ?  Thyroid nodule 04/07/2021  ? GERD (gastroesophageal reflux disease) 04/07/2021  ? Chronic bilateral low back pain with right-sided sciatica 04/07/2021  ? Scoliosis 04/07/2021  ? 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  ? Osteoarthritis, 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  ? Hypertension 04/07/2021  ? High  cholesterol 04/07/2021  ? History of anemia 04/07/2021  ? Body mass index (BMI) of 33.0 to 33.9 in adult 04/07/2021  ? Vitamin D deficiency 04/07/2021  ? OA (osteoarthritis of spine) 01/16/2017  ? S/P lumbar laminectomy 11/02/2016  ? Abnormal CT scan, chest 04/01/2013  ? Upper airway cough syndrome 01/05/2013  ? ? ?Deval Mroczka Chauncey Cruel ?06/24/2021, 9:45 AM ? ?[place] ?[place] [address] ?[place], [place], [address] ?Phone: [phone]   Fax:  [phone] ? ?Name: Margaret Gordon ?MRN: [id number] ?Date of Birth: Nov 11, 1940 ? ? ? ?

## 2021-06-28 ENCOUNTER — Ambulatory Visit: Payer: No Typology Code available for payment source | Admitting: Physical Therapy

## 2021-06-28 ENCOUNTER — Encounter: Payer: Self-pay | Admitting: Physical Therapy

## 2021-06-28 DIAGNOSIS — R262 Difficulty in walking, not elsewhere classified: Secondary | ICD-10-CM | POA: Diagnosis not present

## 2021-06-28 DIAGNOSIS — R42 Dizziness and giddiness: Secondary | ICD-10-CM

## 2021-06-28 DIAGNOSIS — M6281 Muscle weakness (generalized): Secondary | ICD-10-CM

## 2021-06-28 DIAGNOSIS — M545 Low back pain, unspecified: Secondary | ICD-10-CM

## 2021-06-28 DIAGNOSIS — R2681 Unsteadiness on feet: Secondary | ICD-10-CM

## 2021-06-28 NOTE — Therapy (Signed)
[facility] ?[place] ?[address] ?[place], [place], [address] ?Phone: [phone]   Fax:  [phone] ? ?Physical Therapy Treatment ? ?Patient Details  ?Name: Margaret Gordon ?MRN: [id number] ?Date of Birth: Jul 26, 1940 ?Referring Provider (PT): Sherley Bounds ? ? ?Encounter Date: 06/28/2021 ? ? PT End of Session - 06/28/21 1148   ? ? Visit Number 15   ? Date for PT Re-Evaluation 06/29/21   ? PT Start Time 1016   ? PT Stop Time 1057   ? PT Time Calculation (min) 41 min   ? Activity Tolerance Patient tolerated treatment well   ? Behavior During Therapy [facility] for tasks assessed/performed   ? ?  ?  ? ?  ? ? ?Past Medical History:  ?Diagnosis Date  ? Anemia   ? hx  ? Arthritis   ? Asthma   ? GERD (gastroesophageal reflux disease)   ? History of chicken pox   ? History of glaucoma   ? History of hiatal hernia   ? Hyperlipidemia   ? Hypertension   ? Migraines   ? Pneumonia   ? hx  ? Scoliosis   ? ? ?Past Surgical History:  ?Procedure Laterality Date  ? LUMBAR LAMINECTOMY/DECOMPRESSION MICRODISCECTOMY Left 11/02/2016  ? Procedure: Left Lumbar Three-Four, Lumbar Four-Five Laminectomy and Foraminotomy;  Surgeon: Eustace Moore, MD;  Location: [facility];  Service: Neurosurgery;  Laterality: Left;  ? TONSILLECTOMY    ? VESICOVAGINAL FISTULA CLOSURE W/ TAH    ? ? ?There were no vitals filed for this visit. ? ? Subjective Assessment - 06/28/21 1018   ? ? Subjective Patient reports she is feeling better overall. She forgot her cane this weekend and had not issues, so she was plased.   ? Pertinent History She is status post L3-4 L4-5 decompressive hemilaminectomy on the left. Scoliosis   ? Currently in Pain? Yes   ? Pain Score 6    ? Pain Location Back   ? Pain Orientation Lower   ? Pain Descriptors / Indicators Constant   ? Pain Type Acute pain   ? Pain Onset More than a month ago   ? Pain Frequency Constant   ? Multiple Pain Sites No   ? ?  ?  ? ?  ? ? ? ? ? ? ? ? ? ? ? ? ? ? ? ? ? ? ? ? [facility]  Adult PT Treatment/Exercise - 06/28/21 0001   ? ?  ? Ambulation/Gait  ? Gait Comments Walked without AD x 75'. She had 2 episodes of lateral balance loss, with flexed posture, decresed step length, Trendelenberg on R.   ?  ? Lumbar Exercises: Stretches  ? Active Hamstring Stretch Right;Left;3 reps;10 seconds   ? Passive Hamstring Stretch Right;Left;3 reps;10 seconds   ? ITB Stretch Right;Left;3 reps;10 seconds   ?  ? Lumbar Exercises: Aerobic  ? Nustep L5 x 6 minutes   ?  ? Lumbar Exercises: Standing  ? Other Standing Lumbar Exercises Alternate taps on 2" step x 10 reps, then placed R foot on 4" step and performed active hip abd/add x 10 reps, repeated on L.BUE support.   ?  ? Lumbar Exercises: Seated  ? Other Seated Lumbar Exercises clamshells and hip abd against green Tband resistance.   ?  ? Lumbar Exercises: Supine  ? Bridge Compliant;10 reps;2 seconds   ? Bridge Limitations over physioball   ? Other Supine Lumbar Exercises Bridge on physioball, roll side to side, 2  x 5 reps.   ? Other Supine Lumbar Exercises SLR with leg turned into ER, 10 reps each side.   ?  ? Lumbar Exercises: Sidelying  ? Hip Abduction Both;10 reps;1 second   ? ?  ?  ? ?  ? ? ? ? ? ? ? ? ? ? ? ? PT Short Term Goals - 05/13/21 0935   ? ?  ? PT SHORT TERM GOAL #1  ? Title I with basic HEP   ? Status Achieved   ? ?  ?  ? ?  ? ? ? ? PT Long Term Goals - 06/28/21 1030   ? ?  ? PT LONG TERM GOAL #1  ? Title I with final HEP.   ? Baseline continuing to update exercises.   ? Status On-going   ?  ? PT LONG TERM GOAL #3  ? Title Patient will demosntrate lumbar ROM of at least 80% of full in all planes.   ? Status Achieved   ?  ? PT LONG TERM GOAL #4  ? Title Patient will report back pain of <3/10 after performing her normal daily activities Independently.   ? Baseline up to 6/10   ? Time 4   ? Period Weeks   ? Status On-going   ? ?  ?  ? ?  ? ? ? ? ? ? ? ? Plan - 06/28/21 1026   ? ? Clinical Impression Statement Patient reports improvement overall.  She had been running around before treatment, so did have some increased back pain. Therapist initiated treatment activities with stretch for LBP, progressed into trunk stability as patient reports she needs to continue to strengthen her back.   ? ?  ?  ? ?  ? ? ?Patient will benefit from skilled therapeutic intervention in order to improve the following deficits and impairments:    ? ?Visit Diagnosis: ?Difficulty in walking, not elsewhere classified ? ?Muscle weakness (generalized) ? ?Dizziness and giddiness ? ?Acute bilateral low back pain without sciatica ? ?Unsteadiness on feet ? ? ? ? ?Problem List ?Patient Active Problem List  ? Diagnosis Date Noted  ? Decreased GFR 05/10/2021  ? Personal history of colonic polyps 04/07/2021  ? Thyroid nodule 04/07/2021  ? GERD (gastroesophageal reflux disease) 04/07/2021  ? Chronic bilateral low back pain with right-sided sciatica 04/07/2021  ? Scoliosis 04/07/2021  ? 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  ? Osteoarthritis, 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  ? Hypertension 04/07/2021  ? High cholesterol 04/07/2021  ? History of anemia 04/07/2021  ? Body mass index (BMI) of 33.0 to 33.9 in adult 04/07/2021  ? Vitamin D deficiency 04/07/2021  ? OA (osteoarthritis of spine) 01/16/2017  ? S/P lumbar laminectomy 11/02/2016  ? Abnormal CT scan, chest 04/01/2013  ? Upper airway cough syndrome 01/05/2013  ? ? ?Marcelina Morel, DPT ?06/28/2021, 11:49 AM ? ?[facility] ?[address] [place] ?[address] ?[place], [place], [address] ?Phone: [phone]   Fax:  [phone] ? ?Name: Margaret Gordon ?MRN: [id number] ?Date of Birth: Nov 13, 1940 ? ? ? ?

## 2021-06-30 DIAGNOSIS — M419 Scoliosis, unspecified: Secondary | ICD-10-CM | POA: Diagnosis not present

## 2021-06-30 DIAGNOSIS — M4316 Spondylolisthesis, lumbar region: Secondary | ICD-10-CM | POA: Diagnosis not present

## 2021-07-01 ENCOUNTER — Ambulatory Visit: Payer: No Typology Code available for payment source | Admitting: Physical Therapy

## 2021-07-01 ENCOUNTER — Encounter: Payer: Self-pay | Admitting: Physical Therapy

## 2021-07-01 DIAGNOSIS — M6281 Muscle weakness (generalized): Secondary | ICD-10-CM

## 2021-07-01 DIAGNOSIS — M545 Low back pain, unspecified: Secondary | ICD-10-CM

## 2021-07-01 DIAGNOSIS — R262 Difficulty in walking, not elsewhere classified: Secondary | ICD-10-CM

## 2021-07-01 NOTE — Therapy (Signed)
[facility] ?[facility] ?[address] ?[place], [place], [address] ?Phone: [phone]   Fax:  [phone] ? ?Physical Therapy Treatment ? ?Patient Details  ?Name: Margaret Gordon ?MRN: [id number] ?Date of Birth: 1940/05/31 ?Referring Provider (PT): Sherley Bounds ? ? ?Encounter Date: 07/01/2021 ? ? PT End of Session - 07/01/21 1015   ? ? Visit Number 16   ? Date for PT Re-Evaluation 06/29/21   ? PT Start Time 0930   ? PT Stop Time 1012   ? PT Time Calculation (min) 42 min   ? Activity Tolerance Patient tolerated treatment well   ? Behavior During Therapy [facility] for tasks assessed/performed   ? ?  ?  ? ?  ? ? ?Past Medical History:  ?Diagnosis Date  ? Anemia   ? hx  ? Arthritis   ? Asthma   ? GERD (gastroesophageal reflux disease)   ? History of chicken pox   ? History of glaucoma   ? History of hiatal hernia   ? Hyperlipidemia   ? Hypertension   ? Migraines   ? Pneumonia   ? hx  ? Scoliosis   ? ? ?Past Surgical History:  ?Procedure Laterality Date  ? LUMBAR LAMINECTOMY/DECOMPRESSION MICRODISCECTOMY Left 11/02/2016  ? Procedure: Left Lumbar Three-Four, Lumbar Four-Five Laminectomy and Foraminotomy;  Surgeon: Eustace Moore, MD;  Location: [facility];  Service: Neurosurgery;  Laterality: Left;  ? TONSILLECTOMY    ? VESICOVAGINAL FISTULA CLOSURE W/ TAH    ? ? ?There were no vitals filed for this visit. ? ? Subjective Assessment - 07/01/21 0935   ? ? Subjective I'm doing good, I didn't like what the doctor said. He said I have real bad scoliosis.   ? Pertinent History She is status post L3-4 L4-5 decompressive hemilaminectomy on the left. Scoliosis   ? Limitations Lifting;Standing;Walking;House hold activities   ? Currently in Pain? Yes   ? Pain Score 6    ? Pain Location Back   ? Pain Orientation Lower;Left   ? Pain Descriptors / Indicators Constant   ? ?  ?  ? ?  ? ? ? ? ? ? ? ? ? ? ? ? ? ? ? ? ? ? ? ? [facility] Adult PT Treatment/Exercise - 07/01/21 0001   ? ?  ? Lumbar Exercises: Stretches  ?  Passive Hamstring Stretch Right;Left;2 reps;20 seconds   ? Single Knee to Chest Stretch Right;Left;2 reps;20 seconds   ? Double Knee to Chest Stretch 2 reps;30 seconds   ?  ? Lumbar Exercises: Aerobic  ? Nustep lvl 6 x6 mins   ?  ? Lumbar Exercises: Standing  ? Other Standing Lumbar Exercises marching 2x10 on airex HHA   ? Other Standing Lumbar Exercises trunk lateral bending 2x10 5lbs   ?  ? Lumbar Exercises: Seated  ? Other Seated Lumbar Exercises STS on airex 2x10 w/ chest press yellow weight ball   ?  ? Lumbar Exercises: Supine  ? Bridge Compliant;20 reps;3 seconds   ? Bridge Limitations on blue yoga ball   ? Bridge with clamshell Compliant;20 reps   red TB  ? Straight Leg Raise 10 reps;3 seconds   ? ?  ?  ? ?  ? ? ? ? ? ? ? ? ? ? ? ? PT Short Term Goals - 05/13/21 0935   ? ?  ? PT SHORT TERM GOAL #1  ? Title I with basic HEP   ? Status Achieved   ? ?  ?  ? ?  ? ? ? ?  PT Long Term Goals - 07/01/21 1010   ? ?  ? PT LONG TERM GOAL #1  ? Title I with final HEP.   ? Baseline continuing to update exercises.   ? Time 7   ? Period Weeks   ? Status On-going   ? Target Date 06/29/21   ?  ? PT LONG TERM GOAL #4  ? Title Patient will report back pain of <3/10 after performing her normal daily activities Independently.   ? Baseline sometimes 5/10 sometimes 8/10   ? Time 4   ? Period Weeks   ? Status On-going   ? Target Date 06/29/21   ? ?  ?  ? ?  ? ? ? ? ? ? ? ? Plan - 07/01/21 1012   ? ? Clinical Impression Statement Patient came in doing well. She had visited the doctor and they said she had really bad scoliosis which she wasn't happy about. Focused on stretching and strengthening back for today's session. She was compliant and did well overall. Vc's needed for correct posture during bridges w/ clamshells.   ? Personal Factors and Comorbidities Age;Fitness;Comorbidity 3+   ? 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 She is status post L3-4 L4-5 decompressive  hemilaminectomy on the left.   ? Examination-Activity Limitations Reach Overhead;Bend;Locomotion Level;Transfers;Hygiene/Grooming;Dressing;Squat;Sleep   ? Examination-Participation Restrictions Cleaning;Meal Prep;Interpersonal Relationship;Laundry   ? Stability/Clinical Decision Making Evolving/Moderate complexity   ? Clinical Decision Making Moderate   ? Rehab Potential Good   ? PT Frequency 2x / week   ? PT Duration Other (comment)   ? PT Treatment/Interventions ADLs/Self Care Home Management;Iontophoresis 4mg /ml Dexamethasone;Gait training;Stair training;Functional mobility training;Moist Heat;Traction;Therapeutic activities;Therapeutic exercise;Balance training;Neuromuscular re-education;Manual techniques;Dry needling;Passive range of motion;Patient/family education   ? PT Next Visit Plan Progress as tolerated.   ? PT [place]   ? Consulted and Agree with Plan of Care Patient   ? ?  ?  ? ?  ? ? ?Patient will benefit from skilled therapeutic intervention in order to improve the following deficits and impairments:  Abnormal gait, Decreased range of motion, Difficulty walking, Pain, Decreased activity tolerance, Decreased balance, Decreased mobility, Decreased strength, Impaired sensation, Postural dysfunction, Improper body mechanics, Impaired flexibility ? ?Visit Diagnosis: ?Difficulty in walking, not elsewhere classified ? ?Muscle weakness (generalized) ? ?Acute bilateral low back pain without sciatica ? ? ? ? ?Problem List ?Patient Active Problem List  ? Diagnosis Date Noted  ? Decreased GFR 05/10/2021  ? Personal history of colonic polyps 04/07/2021  ? Thyroid nodule 04/07/2021  ? GERD (gastroesophageal reflux disease) 04/07/2021  ? Chronic bilateral low back pain with right-sided sciatica 04/07/2021  ? Scoliosis 04/07/2021  ? 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  ? Osteoarthritis, 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  ? Hypertension 04/07/2021  ? High cholesterol 04/07/2021  ? History of anemia 04/07/2021   ? Body mass index (BMI) of 33.0 to 33.9 in adult 04/07/2021  ? Vitamin D deficiency 04/07/2021  ? OA (osteoarthritis of spine) 01/16/2017  ? S/P lumbar laminectomy 11/02/2016  ? Abnormal CT scan, chest 04/01/2013  ? Upper airway cough syndrome 01/05/2013  ? ? ?Margaret Gordon ?07/01/2021, 10:17 AM ? ?[facility] ?[facility] ?[address] ?[place], [place], [address] ?Phone: [phone]   Fax:  [phone] ? ?Name: Margaret Gordon ?MRN: [id number] ?Date of Birth: 27-May-1940 ? ? ? ?

## 2021-07-05 ENCOUNTER — Ambulatory Visit: Payer: No Typology Code available for payment source | Attending: Neurological Surgery | Admitting: Physical Therapy

## 2021-07-05 ENCOUNTER — Encounter: Payer: Self-pay | Admitting: Physical Therapy

## 2021-07-05 DIAGNOSIS — M6281 Muscle weakness (generalized): Secondary | ICD-10-CM | POA: Diagnosis not present

## 2021-07-05 DIAGNOSIS — R42 Dizziness and giddiness: Secondary | ICD-10-CM | POA: Insufficient documentation

## 2021-07-05 DIAGNOSIS — R262 Difficulty in walking, not elsewhere classified: Secondary | ICD-10-CM | POA: Insufficient documentation

## 2021-07-05 DIAGNOSIS — M545 Low back pain, unspecified: Secondary | ICD-10-CM | POA: Insufficient documentation

## 2021-07-05 DIAGNOSIS — R2681 Unsteadiness on feet: Secondary | ICD-10-CM | POA: Insufficient documentation

## 2021-07-05 NOTE — Therapy (Signed)
[facility] ?[facility] ?[id number] ?[place], [place], [id number] ?Phone: [phone]   Fax:  [phone] ? ?Physical Therapy Treatment ? ?Patient Details  ?Name: Margaret Gordon ?MRN: [id number] ?Date of Birth: 17-Apr-1940 ?Referring Provider (PT): Marikay Alaravid Jones ? ? ?Encounter Date: 07/05/2021 ? ? PT End of Session - 07/05/21 1014   ? ? Visit Number 17   ? Date for PT Re-Evaluation 06/29/21   ? PT Start Time 0930   ? PT Stop Time 1013   ? PT Time Calculation (min) 43 min   ? Activity Tolerance Patient tolerated treatment well   ? Behavior [facility] for tasks assessed/performed   ? ?  ?  ? ?  ? ? ?Past Medical History:  ?Diagnosis Date  ? Anemia   ? hx  ? Arthritis   ? Asthma   ? GERD (gastroesophageal reflux disease)   ? History of chicken pox   ? History of glaucoma   ? History of hiatal hernia   ? Hyperlipidemia   ? Hypertension   ? Migraines   ? Pneumonia   ? hx  ? Scoliosis   ? ? ?Past Surgical History:  ?Procedure Laterality Date  ? LUMBAR LAMINECTOMY/DECOMPRESSION MICRODISCECTOMY Left 11/02/2016  ? Procedure: Left Lumbar Three-Four, Lumbar Four-Five Laminectomy and Foraminotomy;  Surgeon: Tia AlertJones, David S, MD;  [facility];  Service: Neurosurgery;  Laterality: Left;  ? TONSILLECTOMY    ? VESICOVAGINAL FISTULA CLOSURE W/ TAH    ? ? ?There were no vitals filed for this visit. ? ? Subjective Assessment - 07/05/21 0932   ? ? Subjective I'm doing good, I made it through convention this weekend a lot of walking, I'm grateful I could do that. I got a back brace yesterday it feels good. My L knee is hurting like a 8/10.   ? Pertinent History She is status post L3-4 L4-5 decompressive hemilaminectomy on the left. Scoliosis   ? Limitations Lifting;Standing;Walking;House hold activities   ? Patient Stated Goals Be able to take care of herself and move without hurting.   ? Currently in Pain? Yes   ? Pain Score 6    ? Pain Location Back   ? Pain Orientation Lower;Left   ? Pain  Descriptors / Indicators Constant   ? ?  ?  ? ?  ? ? ? ? ? ? ? ? ? ? ? ? ? ? ? ? ? ? ? ? [facility] Adult PT Treatment/Exercise - 07/05/21 0001   ? ?  ? Lumbar Exercises: Stretches  ? Passive Hamstring Stretch Right;Left;2 reps;30 seconds   ? Double Knee to Chest Stretch 2 reps;30 seconds   ?  ? Lumbar Exercises: Aerobic  ? Nustep lvl 6 x 6mins   ?  ? Lumbar Exercises: Machines for Strengthening  ? Other Lumbar Machine Exercise sports cord x4 fwd and backwards 10#; x2 laterally both sides 10#   ?  ? Lumbar Exercises: Standing  ? Other Standing Lumbar Exercises alternating toe taps on cone 2x10 B LE   ? Other Standing Lumbar Exercises trunk lateral bending 2x10 6#   ?  ? Lumbar Exercises: Supine  ? Bridge Compliant;3 seconds;20 reps   ? Bridge Limitations on blue yoga ball   ? Bridge with clamshell Compliant;20 reps;3 seconds   red TB  ? Other Supine Lumbar Exercises SLR B LE 2x10   ? ?  ?  ? ?  ? ? ? ? ? ? ? ? ? ? ? ?  PT Short Term Goals - 05/13/21 0935   ? ?  ? PT SHORT TERM GOAL #1  ? Title I with basic HEP   ? Status Achieved   ? ?  ?  ? ?  ? ? ? ? PT Long Term Goals - 07/05/21 1014   ? ?  ? PT LONG TERM GOAL #1  ? Title I with final HEP.   ? Baseline continuing to update exercises.   ? Time 7   ? Period Weeks   ? Status On-going   ? Target Date 06/29/21   ?  ? PT LONG TERM GOAL #4  ? Title Patient will report back pain of <3/10 after performing her normal daily activities Independently.   ? Baseline sometimes 5/10 sometimes 8/10   ? Time 4   ? Period Weeks   ? Status On-going   ? Target Date 06/29/21   ? ?  ?  ? ?  ? ? ? ? ? ? ? ? Plan - 07/05/21 1028   ? ? Clinical Impression Statement Patient came in doing well. She was fitted for a back brace yesterday and she stated that it had helped. Attempted dead bugs but was unable due to lack of ROM. Vc's needed for correct posture during SLR.   ? Personal Factors and Comorbidities Age;Fitness;Comorbidity 3+   ? Comorbidities scoliosis, known spondylolisthesis at L4-5 with  residual foraminal narrowing. She is status post L3-4 L4-5 decompressive hemilaminectomy on the left.   ? Examination-Activity Limitations Reach Overhead;Bend;Locomotion Level;Transfers;Hygiene/Grooming;Dressing;Squat;Sleep   ? Examination-Participation Restrictions Cleaning;Meal Prep;Interpersonal Relationship;Laundry   ? Stability/Clinical Decision Making Evolving/Moderate complexity   ? Clinical Decision Making Moderate   ? Rehab Potential Good   ? PT Frequency 2x / week   ? PT Duration Other (comment)   ? PT Treatment/Interventions ADLs/Self Care Home Management;Iontophoresis 4mg /ml Dexamethasone;Gait training;Stair training;Functional mobility training;Moist Heat;Traction;Therapeutic activities;Therapeutic exercise;Balance training;Neuromuscular re-education;Manual techniques;Dry needling;Passive range of motion;Patient/family education   ? PT Next Visit Plan Progress as tolerated.   ? PT Home Exercise Plan   ? Consulted and Agree with Plan of Care Patient   ? ?  ?  ? ?  ? ? ?Patient will benefit from skilled therapeutic intervention in order to improve the following deficits and impairments:  Abnormal gait, Decreased range of motion, Difficulty walking, Pain, Decreased activity tolerance, Decreased balance, Decreased mobility, Decreased strength, Impaired sensation, Postural dysfunction, Improper body mechanics, Impaired flexibility ? ?Visit Diagnosis: ?Difficulty in walking, not elsewhere classified ? ?Muscle weakness (generalized) ? ?Acute bilateral low back pain without sciatica ? ?Unsteadiness on feet ? ? ? ? ?Problem List ?Patient Active Problem List  ? Diagnosis Date Noted  ? Decreased GFR 05/10/2021  ? Personal history of colonic polyps 04/07/2021  ? Thyroid nodule 04/07/2021  ? GERD (gastroesophageal reflux disease) 04/07/2021  ? Chronic bilateral low back pain with right-sided sciatica 04/07/2021  ? Scoliosis 04/07/2021  ? 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  ? Osteoarthritis, knee  04/07/2021  ? Hypertension 04/07/2021  ? High cholesterol 04/07/2021  ? History of anemia 04/07/2021  ? Body mass index (BMI) of 33.0 to 33.9 in adult 04/07/2021  ? Vitamin D deficiency 04/07/2021  ? OA (osteoarthritis of spine) 01/16/2017  ? S/P lumbar laminectomy 11/02/2016  ? Abnormal CT scan, chest 04/01/2013  ? Upper airway cough syndrome 01/05/2013  ? ? ?Jerron Niblack 13/04/2012 ?07/05/2021, 10:56 AM ? ?[facility] ?[facility] ?[address] [facility] ?[place], [place], [place] ?Phone: [phone]  Fax:  [phone] ? ?Name: Margaret Gordon ?MRN: [id number] ?Date of Birth: May 13, 1940 ? ? ? ?

## 2021-07-08 ENCOUNTER — Ambulatory Visit: Payer: No Typology Code available for payment source | Admitting: Physical Therapy

## 2021-07-08 ENCOUNTER — Encounter: Payer: Self-pay | Admitting: Physical Therapy

## 2021-07-08 DIAGNOSIS — R262 Difficulty in walking, not elsewhere classified: Secondary | ICD-10-CM

## 2021-07-08 DIAGNOSIS — M545 Low back pain, unspecified: Secondary | ICD-10-CM

## 2021-07-08 DIAGNOSIS — R2681 Unsteadiness on feet: Secondary | ICD-10-CM

## 2021-07-08 DIAGNOSIS — M6281 Muscle weakness (generalized): Secondary | ICD-10-CM

## 2021-07-08 DIAGNOSIS — R42 Dizziness and giddiness: Secondary | ICD-10-CM

## 2021-07-08 NOTE — Therapy (Signed)
[facility] ?[place] [address] ?[place], [place], [address] ?Phone: [phone]   Fax:  [phone] ? ?Physical Therapy Treatment ? ?Patient Details  ?Name: Margaret Gordon ?MRN: [id number] ?Date of Birth: May 10, 1940 ?Referring Provider (PT): Sherley Bounds ? ? ?Encounter Date: 07/08/2021 ? ? PT End of Session - 07/08/21 1011   ? ? Visit Number 18   ? Date for PT Re-Evaluation 06/29/21   ? PT Start Time 0930   ? PT Stop Time 1010   ? PT Time Calculation (min) 40 min   ? Activity Tolerance Patient tolerated treatment well   ? Behavior During Therapy [facility] for tasks assessed/performed   ? ?  ?  ? ?  ? ? ?Past Medical History:  ?Diagnosis Date  ? Anemia   ? hx  ? Arthritis   ? Asthma   ? GERD (gastroesophageal reflux disease)   ? History of chicken pox   ? History of glaucoma   ? History of hiatal hernia   ? Hyperlipidemia   ? Hypertension   ? Migraines   ? Pneumonia   ? hx  ? Scoliosis   ? ? ?Past Surgical History:  ?Procedure Laterality Date  ? LUMBAR LAMINECTOMY/DECOMPRESSION MICRODISCECTOMY Left 11/02/2016  ? Procedure: Left Lumbar Three-Four, Lumbar Four-Five Laminectomy and Foraminotomy;  Surgeon: Eustace Moore, MD;  Location: [facility];  Service: Neurosurgery;  Laterality: Left;  ? TONSILLECTOMY    ? VESICOVAGINAL FISTULA CLOSURE W/ TAH    ? ? ?There were no vitals filed for this visit. ? ? Subjective Assessment - 07/08/21 0935   ? ? Subjective I'm doing good. Having some pain in my left hip but it's only in the morning. It tends to go away later on.   ? Pertinent History She is status post L3-4 L4-5 decompressive hemilaminectomy on the left. Scoliosis   ? Limitations Lifting;Standing;Walking;House hold activities   ? Patient Stated Goals Be able to take care of herself and move without hurting.   ? Currently in Pain? Yes   ? Pain Score 7    ? Pain Location Hip   ? Pain Orientation Left   ? Pain Descriptors / Indicators Constant   ? ?  ?  ? ?   ? ? ? ? ? ? ? ? ? ? ? ? ? ? ? ? ? ? ? ? [facility] Adult PT Treatment/Exercise - 07/08/21 0001   ? ?  ? Lumbar Exercises: Aerobic  ? Recumbent Bike lvl 2 x 6 mins   ?  ? Lumbar Exercises: Standing  ? Other Standing Lumbar Exercises alternating steps taps on cones 3x10; marching 2x10 B LE   ? Other Standing Lumbar Exercises lateral stepping on airex 2x10 each way; tandem stance 30 secs x2 each leg   ?  ? Manual Therapy  ? Soft tissue mobilization IT band   ? Passive ROM hip flexion and ITB stretch 30 secs x4   ? ?  ?  ? ?  ? ? ? ? ? ? ? ? ? ? ? ? PT Short Term Goals - 05/13/21 0935   ? ?  ? PT SHORT TERM GOAL #1  ? Title I with basic HEP   ? Status Achieved   ? ?  ?  ? ?  ? ? ? ? PT Long Term Goals - 07/08/21 1009   ? ?  ? PT LONG TERM GOAL #1  ? Title I with final HEP.   ?  Baseline continuing to update exercises.   ? Time 7   ? Period Weeks   ? Status On-going   ? Target Date 06/29/21   ?  ? PT LONG TERM GOAL #2  ? Title Increase FOTO score to 51   ? Baseline 44   ? Time 8   ? Period Weeks   ? Status On-going   ? Target Date 06/29/21   ?  ? PT LONG TERM GOAL #3  ? Title Patient will demosntrate lumbar ROM of at least 80% of full in all planes.   ? Baseline Met   ? Time 8   ? Period Weeks   ? Status Achieved   ? Target Date 06/29/21   ?  ? PT LONG TERM GOAL #4  ? Title Patient will report back pain of <3/10 after performing her normal daily activities Independently.   ? Baseline sometimes 5/10   ? Time 4   ? Period Weeks   ? Status On-going   ? Target Date 06/29/21   ? ?  ?  ? ?  ? ? ? ? ? ? ? ? Plan - 07/08/21 1016   ? ? Clinical Impression Statement Patient came in doing well. Focused on balance and stretching for this session. Ended w/ STM to the ITBand to relieve pain. VC's needed to lift knees higher w/ marches and take bigger steps doing lateral side steps on airex.   ? Personal Factors and Comorbidities Age;Fitness;Comorbidity 3+   ? Comorbidities scoliosis, known spondylolisthesis at L4-5 with residual foraminal  narrowing. She is status post L3-4 L4-5 decompressive hemilaminectomy on the left.   ? Examination-Activity Limitations Reach Overhead;Bend;Locomotion Level;Transfers;Hygiene/Grooming;Dressing;Squat;Sleep   ? Examination-Participation Restrictions Cleaning;Meal Prep;Interpersonal Relationship;Laundry   ? Stability/Clinical Decision Making Evolving/Moderate complexity   ? Clinical Decision Making Moderate   ? Rehab Potential Good   ? PT Frequency 2x / week   ? PT Duration Other (comment)   ? PT Treatment/Interventions ADLs/Self Care Home Management;Iontophoresis 42m/ml Dexamethasone;Gait training;Stair training;Functional mobility training;Moist Heat;Traction;Therapeutic activities;Therapeutic exercise;Balance training;Neuromuscular re-education;Manual techniques;Dry needling;Passive range of motion;Patient/family education   ? PT Next Visit Plan Add more exercises to HEP   ? PT [place]  ? Consulted and Agree with Plan of Care Patient   ? ?  ?  ? ?  ? ? ?Patient will benefit from skilled therapeutic intervention in order to improve the following deficits and impairments:  Abnormal gait, Decreased range of motion, Difficulty walking, Pain, Decreased activity tolerance, Decreased balance, Decreased mobility, Decreased strength, Impaired sensation, Postural dysfunction, Improper body mechanics, Impaired flexibility ? ?Visit Diagnosis: ?Difficulty in walking, not elsewhere classified ? ?Muscle weakness (generalized) ? ?Acute bilateral low back pain without sciatica ? ?Unsteadiness on feet ? ?Dizziness and giddiness ? ? ? ? ?Problem List ?Patient Active Problem List  ? Diagnosis Date Noted  ? Decreased GFR 05/10/2021  ? Personal history of colonic polyps 04/07/2021  ? Thyroid nodule 04/07/2021  ? GERD (gastroesophageal reflux disease) 04/07/2021  ? Chronic bilateral low back pain with right-sided sciatica 04/07/2021  ? Scoliosis 04/07/2021  ? 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  ?  Osteoarthritis, 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  ? Hypertension 04/07/2021  ? High cholesterol 04/07/2021  ? History of anemia 04/07/2021  ? Body mass index (BMI) of 33.0 to 33.9 in adult 04/07/2021  ? Vitamin D deficiency 04/07/2021  ? OA (osteoarthritis of spine) 01/16/2017  ? S/P lumbar laminectomy 11/02/2016  ? Abnormal CT scan, chest 04/01/2013  ? Upper airway  cough syndrome 01/05/2013  ? ? ?Hulbert Branscome Chauncey Cruel ?07/08/2021, 10:23 AM ? ?[place] ?[place] ?[address] ?[place], [place], [address] ?Phone: [phone]   Fax:  [phone] ? ?Name: WHITNI PASQUINI ?MRN: [id number] ?Date of Birth: January 29, 1941 ? ? ? ?

## 2021-07-12 ENCOUNTER — Ambulatory Visit: Payer: No Typology Code available for payment source | Admitting: Physical Therapy

## 2021-07-12 DIAGNOSIS — R262 Difficulty in walking, not elsewhere classified: Secondary | ICD-10-CM | POA: Diagnosis not present

## 2021-07-12 DIAGNOSIS — M6281 Muscle weakness (generalized): Secondary | ICD-10-CM

## 2021-07-12 DIAGNOSIS — M545 Low back pain, unspecified: Secondary | ICD-10-CM

## 2021-07-12 NOTE — Therapy (Signed)
[facility] ?[place] ?[address] ?[place], [place], [address] ?Phone: [phone]   Fax:  [phone] ? ?Physical Therapy Treatment ? ?Patient Details  ?Name: Margaret Gordon ?MRN: [id number] ?Date of Birth: 17-May-1940 ?Referring Provider (PT): Sherley Bounds ? ? ?Encounter Date: 07/12/2021 ? ? PT End of Session - 07/12/21 0846   ? ? Visit Number 19   ? Date for PT Re-Evaluation 08/29/21   ? PT Start Time 0845   ? PT Stop Time 0930   ? PT Time Calculation (min) 45 min   ? ?  ?  ? ?  ? ? ?Past Medical History:  ?Diagnosis Date  ? Anemia   ? hx  ? Arthritis   ? Asthma   ? GERD (gastroesophageal reflux disease)   ? History of chicken pox   ? History of glaucoma   ? History of hiatal hernia   ? Hyperlipidemia   ? Hypertension   ? Migraines   ? Pneumonia   ? hx  ? Scoliosis   ? ? ?Past Surgical History:  ?Procedure Laterality Date  ? LUMBAR LAMINECTOMY/DECOMPRESSION MICRODISCECTOMY Left 11/02/2016  ? Procedure: Left Lumbar Three-Four, Lumbar Four-Five Laminectomy and Foraminotomy;  Surgeon: Eustace Moore, MD;  Location: [facility];  Service: Neurosurgery;  Laterality: Left;  ? TONSILLECTOMY    ? VESICOVAGINAL FISTULA CLOSURE W/ TAH    ? ? ?There were no vitals filed for this visit. ? ? Subjective Assessment - 07/12/21 0844   ? ? Subjective left hip really hurting this morning   ? Currently in Pain? Yes   ? Pain Score 9    ? Pain Location Hip   ? Pain Orientation Left   ? ?  ?  ? ?  ? ? ? ? ? ? ? ? ? ? ? ? ? ? ? ? ? ? ? ? [facility] Adult PT Treatment/Exercise - 07/12/21 0001   ? ?  ? Lumbar Exercises: Aerobic  ? Nustep L 5 79m LE only   ?  ? Lumbar Exercises: Machines for Strengthening  ? Cybex Lumbar Extension black tband trunk flex and ext 2 sets 10   ? Cybex Knee Extension 5# 2x10   ? Cybex Knee Flexion 20# 2x10   ? Other Lumbar Machine Exercise rows 1x10 20#   ?  ? Lumbar Exercises: Standing  ? Other Standing Lumbar Exercises wt ball trunk rotation 15 x each   ?  ? Lumbar Exercises:  Seated  ? Other Seated Lumbar Exercises STS on airex 10 xw/ chest press yellow weight ball   ? Other Seated Lumbar Exercises clamshells and hip abd against green Tband resistance.   ADD ball squeeze  ?  ? Manual Therapy  ? Soft tissue mobilization IT band and quad   ? Passive ROM hip flexion and ITB stretch 30 secs x4   ? ?  ?  ? ?  ? ? ? ? ? ? ? ? ? ? ? ? PT Short Term Goals - 05/13/21 0935   ? ?  ? PT SHORT TERM GOAL #1  ? Title I with basic HEP   ? Status Achieved   ? ?  ?  ? ?  ? ? ? ? PT Long Term Goals - 07/12/21 0907   ? ?  ? PT LONG TERM GOAL #1  ? Title I with final HEP.   ? Baseline evolving as we go   ? Status Partially Met   ?  ?  PT LONG TERM GOAL #3  ? Title Patient will demosntrate lumbar ROM of at least 80% of full in all planes.   ? Status Achieved   ?  ? PT LONG TERM GOAL #4  ? Title Patient will report back pain of <3/10 after performing her normal daily activities Independently.   ? Baseline back and left hip vary   ? Status Partially Met   ? ?  ?  ? ?  ? ? ? ? ? ? ? ? Plan - 07/12/21 0926   ? ? Clinical Impression Statement progressed strengtheing ex with cuing to engage core with ex, limited with RT shld pain and left hip pain. Good HS flexibiity but IT and Left quad are very tight. progressing with goals   ? PT Treatment/Interventions ADLs/Self Care Home Management;Iontophoresis 39m/ml Dexamethasone;Gait training;Stair training;Functional mobility training;Moist Heat;Traction;Therapeutic activities;Therapeutic exercise;Balance training;Neuromuscular re-education;Manual techniques;Dry needling;Passive range of motion;Patient/family education   ? PT Next Visit Plan Add more exercises to HEP ( changed renewal date to reflect correct date as renewal done 06/28/21)   ? ?  ?  ? ?  ? ? ?Patient will benefit from skilled therapeutic intervention in order to improve the following deficits and impairments:  Abnormal gait, Decreased range of motion, Difficulty walking, Pain, Decreased activity tolerance,  Decreased balance, Decreased mobility, Decreased strength, Impaired sensation, Postural dysfunction, Improper body mechanics, Impaired flexibility ? ?Visit Diagnosis: ?Difficulty in walking, not elsewhere classified ? ?Muscle weakness (generalized) ? ?Acute bilateral low back pain without sciatica ? ? ? ? ?Problem List ?Patient Active Problem List  ? Diagnosis Date Noted  ? Decreased GFR 05/10/2021  ? Personal history of colonic polyps 04/07/2021  ? Thyroid nodule 04/07/2021  ? GERD (gastroesophageal reflux disease) 04/07/2021  ? Chronic bilateral low back pain with right-sided sciatica 04/07/2021  ? Scoliosis 04/07/2021  ? 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  ? Osteoarthritis, 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  ? Hypertension 04/07/2021  ? High cholesterol 04/07/2021  ? History of anemia 04/07/2021  ? Body mass index (BMI) of 33.0 to 33.9 in adult 04/07/2021  ? Vitamin D deficiency 04/07/2021  ? OA (osteoarthritis of spine) 01/16/2017  ? S/P lumbar laminectomy 11/02/2016  ? Abnormal CT scan, chest 04/01/2013  ? Upper airway cough syndrome 01/05/2013  ? ? ?Finley Chevez,ANGIE, PTA ?07/12/2021, 9:29 AM ? ?[facility] ?[place] ?[place] [address]?Phone: [phone]  Fax:  [phone]? ?Name: Margaret Gordon?MRN: [id number]?Date of Birth: 505-30-1942? ? ? ?

## 2021-07-12 NOTE — Addendum Note (Signed)
Addended by: Iona Beard on: 07/12/2021 12:30 PM ? ? Modules accepted: Orders ? ?

## 2021-07-12 NOTE — Therapy (Deleted)
[facility] ?[facility] ?[address] ?[place], [place], [address] ?Phone: [phone]   Fax:  [phone] ? ?Physical Therapy Treatment ? ?Patient Details  ?Name: Margaret Gordon ?MRN: [id number] ?Date of Birth: 02/05/1941 ?Referring Provider (PT): David Jones ? ? ?Encounter Date: 07/12/2021 ? ? PT End of Session - 07/12/21 0846   ? ? Visit Number 19   ? Date for PT Re-Evaluation 07/29/21   ? PT Start Time 0845   ? PT Stop Time 0930   ? PT Time Calculation (min) 45 min   ? ?  ?  ? ?  ? ? ?Past Medical History:  ?Diagnosis Date  ? Anemia   ? hx  ? Arthritis   ? Asthma   ? GERD (gastroesophageal reflux disease)   ? History of chicken pox   ? History of glaucoma   ? History of hiatal hernia   ? Hyperlipidemia   ? Hypertension   ? Migraines   ? Pneumonia   ? hx  ? Scoliosis   ? ? ?Past Surgical History:  ?Procedure Laterality Date  ? LUMBAR LAMINECTOMY/DECOMPRESSION MICRODISCECTOMY Left 11/02/2016  ? Procedure: Left Lumbar Three-Four, Lumbar Four-Five Laminectomy and Foraminotomy;  Surgeon: Jones, David S, MD;  Location[facility];  Service: Neurosurgery;  Laterality: Left;  ? TONSILLECTOMY    ? VESICOVAGINAL FISTULA CLOSURE W/ TAH    ? ? ?There were no vitals filed for this visit. ? ? Subjective Assessment - 07/12/21 0844   ? ? Subjective left hip really hurting this morning   ? Currently in Pain? Yes   ? Pain Score 9    ? Pain Location Hip   ? Pain Orientation Left   ? ?  ?  ? ?  ? ? ? ? ? ? ? ? ? ? ? ? ? ? ? ? ? ? ? ? [facility] Adult PT Treatment/Exercise - 07/12/21 0001   ? ?  ? Lumbar Exercises: Aerobic  ? Nustep L 5 6mn LE only   ?  ? Lumbar Exercises: Machines for Strengthening  ? Cybex Lumbar Extension black tband trunk flex and ext 2 sets 10   ? Cybex Knee Extension 5# 2x10   ? Cybex Knee Flexion 20# 2x10   ? Other Lumbar Machine Exercise rows 1x10 20#   ?  ? Lumbar Exercises: Standing  ? Other Standing Lumbar Exercises wt ball trunk rotation 15 x each   ?  ? Lumbar Exercises:  Seated  ? Other Seated Lumbar Exercises STS on airex 10 xw/ chest press yellow weight ball   ? Other Seated Lumbar Exercises clamshells and hip abd against green Tband resistance.   ADD ball squeeze  ?  ? Manual Therapy  ? Soft tissue mobilization IT band and quad   ? Passive ROM hip flexion and ITB stretch 30 secs x4   ? ?  ?  ? ?  ? ? ? ? ? ? ? ? ? ? ? ? PT Short Term Goals - 05/13/21 0935   ? ?  ? PT SHORT TERM GOAL #1  ? Title I with basic HEP   ? Status Achieved   ? ?  ?  ? ?  ? ? ? ? PT Long Term Goals - 07/12/21 0907   ? ?  ? PT LONG TERM GOAL #1  ? Title I with final HEP.   ? Baseline evolving as we go   ? Status Partially Met   ?  ?   PT LONG TERM GOAL #3  ? Title Patient will demosntrate lumbar ROM of at least 80% of full in all planes.   ? Status Achieved   ?  ? PT LONG TERM GOAL #4  ? Title Patient will report back pain of <3/10 after performing her normal daily activities Independently.   ? Baseline back and left hip vary   ? Status Partially Met   ? ?  ?  ? ?  ? ? ? ? ? ? ? ? ? ?Patient will benefit from skilled therapeutic intervention in order to improve the following deficits and impairments:    ? ?Visit Diagnosis: ?Difficulty in walking, not elsewhere classified ? ?Muscle weakness (generalized) ? ?Acute bilateral low back pain without sciatica ? ? ? ? ?Problem List ?Patient Active Problem List  ? Diagnosis Date Noted  ? Decreased GFR 05/10/2021  ? Personal history of colonic polyps 04/07/2021  ? Thyroid nodule 04/07/2021  ? GERD (gastroesophageal reflux disease) 04/07/2021  ? Chronic bilateral low back pain with right-sided sciatica 04/07/2021  ? Scoliosis 04/07/2021  ? 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  ? Osteoarthritis, 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  ? Hypertension 04/07/2021  ? High cholesterol 04/07/2021  ? History of anemia 04/07/2021  ? Body mass index (BMI) of 33.0 to 33.9 in adult 04/07/2021  ? Vitamin D deficiency 04/07/2021  ? OA (osteoarthritis of spine) 01/16/2017  ? S/P lumbar laminectomy  11/02/2016  ? Abnormal CT scan, chest 04/01/2013  ? Upper airway cough syndrome 01/05/2013  ? ? ?Broxton Broady,ANGIE, PTA ?07/12/2021, 9:10 AM ? ?[facility] ?[place] ?[address] ?[place], [place], [address] ?Phone: [phone]   Fax:  [phone] ? ?Name: Margaret Gordon ?MRN: [id number] ?Date of Birth: 1940-06-28 ? ? ? ?

## 2021-07-15 ENCOUNTER — Ambulatory Visit: Payer: No Typology Code available for payment source | Admitting: Physical Therapy

## 2021-07-15 DIAGNOSIS — M6281 Muscle weakness (generalized): Secondary | ICD-10-CM

## 2021-07-15 DIAGNOSIS — R262 Difficulty in walking, not elsewhere classified: Secondary | ICD-10-CM | POA: Diagnosis not present

## 2021-07-15 DIAGNOSIS — M545 Low back pain, unspecified: Secondary | ICD-10-CM

## 2021-07-15 NOTE — Therapy (Signed)
[facility] ?[place] ?[address] ?[place], [place], [address] ?Phone: [phone]   Fax:  [phone] ? ?Physical Therapy Treatment ? ?Patient Details  ?Name: Margaret Gordon ?MRN: [id number] ?Date of Birth: October 06, 1940 ?Referring Provider (PT): Sherley Bounds ? ? ?Encounter Date: 07/15/2021 ? ? PT End of Session - 07/15/21 1015   ? ? Visit Number 20   ? Date for PT Re-Evaluation 08/29/21   ? PT Start Time 0930   ? PT Stop Time 1015   ? PT Time Calculation (min) 45 min   ? ?  ?  ? ?  ? ? ?Past Medical History:  ?Diagnosis Date  ? Anemia   ? hx  ? Arthritis   ? Asthma   ? GERD (gastroesophageal reflux disease)   ? History of chicken pox   ? History of glaucoma   ? History of hiatal hernia   ? Hyperlipidemia   ? Hypertension   ? Migraines   ? Pneumonia   ? hx  ? Scoliosis   ? ? ?Past Surgical History:  ?Procedure Laterality Date  ? LUMBAR LAMINECTOMY/DECOMPRESSION MICRODISCECTOMY Left 11/02/2016  ? Procedure: Left Lumbar Three-Four, Lumbar Four-Five Laminectomy and Foraminotomy;  Surgeon: Eustace Moore, MD;  Location: [facility];  Service: Neurosurgery;  Laterality: Left;  ? TONSILLECTOMY    ? VESICOVAGINAL FISTULA CLOSURE W/ TAH    ? ? ?There were no vitals filed for this visit. ? ? Subjective Assessment - 07/15/21 0931   ? ? Subjective strength and func 70-80 % better. pain good and bad days- prior to PT bad daily. standing increases pain less than 5 min , slight flexion helps. relief with sitting - goes away   ? Currently in Pain? Yes   ? Pain Score 6    ? Pain Location Back   ? Pain Orientation Left   ? ?  ?  ? ?  ? ? ? ? ? ? ? ? ? ? ? ? ? ? ? ? ? ? ? ? [facility] Adult PT Treatment/Exercise - 07/15/21 0001   ? ?  ? Lumbar Exercises: Aerobic  ? Nustep L 5 26m LE only   ?  ? Lumbar Exercises: Machines for Strengthening  ? Cybex Lumbar Extension black tband trunk flex and ext 2 sets 10   ?  ? Lumbar Exercises: Standing  ? Row Strengthening;Both;20 reps   ? Theraband Level (Row) Level 2  (Red)   ? Shoulder Extension Strengthening;Both;20 reps;Theraband   ? Theraband Level (Shoulder Extension) Level 2 (Red)   ? Other Standing Lumbar Exercises 4 inch step up opp leg ext 10 x BIL with UE support then lateral step up with hip abd   ? Other Standing Lumbar Exercises standing trunk ext with wt ball 10 x   ?  ? Lumbar Exercises: Seated  ? Other Seated Lumbar Exercises STS on airex 10 xw/ chest press yellow weight ball   ? Other Seated Lumbar Exercises clamshells and hip abd against green Tband resistance.   ?  ? Manual Therapy  ? Manual Therapy Soft tissue mobilization;Passive ROM   ? Soft tissue mobilization Left ITB   ? Passive ROM BIL LE and trunk   ? ?  ?  ? ?  ? ? ? ? ? ? ? ? ? ? ? ? PT Short Term Goals - 05/13/21 0935   ? ?  ? PT SHORT TERM GOAL #1  ? Title I with basic HEP   ?  Status Achieved   ? ?  ?  ? ?  ? ? ? ? PT Long Term Goals - 07/12/21 0907   ? ?  ? PT LONG TERM GOAL #1  ? Title I with final HEP.   ? Baseline evolving as we go   ? Status Partially Met   ?  ? PT LONG TERM GOAL #3  ? Title Patient will demosntrate lumbar ROM of at least 80% of full in all planes.   ? Status Achieved   ?  ? PT LONG TERM GOAL #4  ? Title Patient will report back pain of <3/10 after performing her normal daily activities Independently.   ? Baseline back and left hip vary   ? Status Partially Met   ? ?  ?  ? ?  ? ? ? ? ? ? ? ? Plan - 07/15/21 1016   ? ? Clinical Impression Statement progressed standing ex as this is her most limiting func actvitity- less than 5 min and relieved with flex. did well with cuing but did needed seated rest. tightness and pain left IT   ? PT Treatment/Interventions ADLs/Self Care Home Management;Iontophoresis 70m/ml Dexamethasone;Gait training;Stair training;Functional mobility training;Moist Heat;Traction;Therapeutic activities;Therapeutic exercise;Balance training;Neuromuscular re-education;Manual techniques;Dry needling;Passive range of motion;Patient/family education   ? PT Next  Visit Plan progress with standing ex   ? ?  ?  ? ?  ? ? ?Patient will benefit from skilled therapeutic intervention in order to improve the following deficits and impairments:  Abnormal gait, Decreased range of motion, Difficulty walking, Pain, Decreased activity tolerance, Decreased balance, Decreased mobility, Decreased strength, Impaired sensation, Postural dysfunction, Improper body mechanics, Impaired flexibility ? ?Visit Diagnosis: ?Difficulty in walking, not elsewhere classified ? ?Muscle weakness (generalized) ? ?Acute bilateral low back pain without sciatica ? ? ? ? ?Problem List ?Patient Active Problem List  ? Diagnosis Date Noted  ? Decreased GFR 05/10/2021  ? Personal history of colonic polyps 04/07/2021  ? Thyroid nodule 04/07/2021  ? GERD (gastroesophageal reflux disease) 04/07/2021  ? Chronic bilateral low back pain with right-sided sciatica 04/07/2021  ? Scoliosis 04/07/2021  ? 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  ? Osteoarthritis, 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  ? Hypertension 04/07/2021  ? High cholesterol 04/07/2021  ? History of anemia 04/07/2021  ? Body mass index (BMI) of 33.0 to 33.9 in adult 04/07/2021  ? Vitamin D deficiency 04/07/2021  ? OA (osteoarthritis of spine) 01/16/2017  ? S/P lumbar laminectomy 11/02/2016  ? Abnormal CT scan, chest 04/01/2013  ? Upper airway cough syndrome 01/05/2013  ? ? ?Margaret Gordon,Margaret Gordon, Margaret Gordon ?07/15/2021, 10:17 AM ? ?[place] ?[place] [place] ?[place] [address]?Phone: [phone]  Fax:  [phone]? ?Name: Margaret Gordon?MRN: [id number]?Date of Birth: 5August 11, 1942? ? ? ?

## 2021-07-19 ENCOUNTER — Ambulatory Visit: Payer: No Typology Code available for payment source | Admitting: Physical Therapy

## 2021-07-19 DIAGNOSIS — R262 Difficulty in walking, not elsewhere classified: Secondary | ICD-10-CM

## 2021-07-19 DIAGNOSIS — M545 Low back pain, unspecified: Secondary | ICD-10-CM

## 2021-07-19 DIAGNOSIS — M6281 Muscle weakness (generalized): Secondary | ICD-10-CM

## 2021-07-19 NOTE — Therapy (Signed)
[facility] [address] ?[place], [place], [address] ?Phone: [phone]   Fax:  [phone] ? ?Physical Therapy Treatment ? ?Patient Details  ?Name: Margaret Gordon ?MRN: [id number] ?Date of Birth: December 16, 1940 ?Referring Provider (PT): Sherley Bounds ? ? ?Encounter Date: 07/19/2021 ? ? PT End of Session - 07/19/21 1100   ? ? Visit Number 21   ? Date for PT Re-Evaluation 08/29/21   ? PT Start Time 1015   ? PT Stop Time 1100   ? PT Time Calculation (min) 45 min   ? ?  ?  ? ?  ? ? ?Past Medical History:  ?Diagnosis Date  ? Anemia   ? hx  ? Arthritis   ? Asthma   ? GERD (gastroesophageal reflux disease)   ? History of chicken pox   ? History of glaucoma   ? History of hiatal hernia   ? Hyperlipidemia   ? Hypertension   ? Migraines   ? Pneumonia   ? hx  ? Scoliosis   ? ? ?Past Surgical History:  ?Procedure Laterality Date  ? LUMBAR LAMINECTOMY/DECOMPRESSION MICRODISCECTOMY Left 11/02/2016  ? Procedure: Left Lumbar Three-Four, Lumbar Four-Five Laminectomy and Foraminotomy;  Surgeon: Eustace Moore, MD;  Location: [facility];  Service: Neurosurgery;  Laterality: Left;  ? TONSILLECTOMY    ? VESICOVAGINAL FISTULA CLOSURE W/ TAH    ? ? ?There were no vitals filed for this visit. ? ? Subjective Assessment - 07/19/21 1019   ? ? Subjective doing okay   ? Currently in Pain? Yes   ? Pain Score 5    ? Pain Location Back   ? Pain Orientation Left   ? ?  ?  ? ?  ? ? ? ? ? ? ? ? ? ? ? ? ? ? ? ? ? ? ? ? [facility] Adult PT Treatment/Exercise - 07/19/21 0001   ? ?  ? Lumbar Exercises: Aerobic  ? Nustep L 5 32m LE only   ?  ? Lumbar Exercises: Standing  ? Other Standing Lumbar Exercises resisted gait 30 # fwd 5 x, laterally 3 x ( pain going Left)   4 inch step ups fwd and lateral with opp leg raise UE support  ? Other Standing Lumbar Exercises 10# cable pulleys 2 sets 10 row and shld ext   ?  ? Manual Therapy  ? Manual Therapy Soft tissue mobilization;Passive ROM   ? Soft tissue mobilization Left ITB    ? Passive ROM BIL LE and trunk   ? ?  ?  ? ?  ? ? ? ? ? ? ? ? ? ? ? ? PT Short Term Goals - 05/13/21 0935   ? ?  ? PT SHORT TERM GOAL #1  ? Title I with basic HEP   ? Status Achieved   ? ?  ?  ? ?  ? ? ? ? PT Long Term Goals - 07/12/21 0907   ? ?  ? PT LONG TERM GOAL #1  ? Title I with final HEP.   ? Baseline evolving as we go   ? Status Partially Met   ?  ? PT LONG TERM GOAL #3  ? Title Patient will demosntrate lumbar ROM of at least 80% of full in all planes.   ? Status Achieved   ?  ? PT LONG TERM GOAL #4  ? Title Patient will report back pain of <3/10 after performing her normal daily activities Independently.   ?  Baseline back and left hip vary   ? Status Partially Met   ? ?  ?  ? ?  ? ? ? ? ? ? ? ? Plan - 07/19/21 1100   ? ? Clinical Impression Statement continue to work on standing func strengthening with cuing and rest   ? PT Next Visit Plan progress with standing ex   ? ?  ?  ? ?  ? ? ?Patient will benefit from skilled therapeutic intervention in order to improve the following deficits and impairments:  Abnormal gait, Decreased range of motion, Difficulty walking, Pain, Decreased activity tolerance, Decreased balance, Decreased mobility, Decreased strength, Impaired sensation, Postural dysfunction, Improper body mechanics, Impaired flexibility ? ?Visit Diagnosis: ?Difficulty in walking, not elsewhere classified ? ?Acute bilateral low back pain without sciatica ? ?Muscle weakness (generalized) ? ? ? ? ?Problem List ?Patient Active Problem List  ? Diagnosis Date Noted  ? Decreased GFR 05/10/2021  ? Personal history of colonic polyps 04/07/2021  ? Thyroid nodule 04/07/2021  ? GERD (gastroesophageal reflux disease) 04/07/2021  ? Chronic bilateral low back pain with right-sided sciatica 04/07/2021  ? Scoliosis 04/07/2021  ? 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  ? Osteoarthritis, 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  ? Hypertension 04/07/2021  ? High cholesterol 04/07/2021  ? History of anemia 04/07/2021  ? Body mass index  (BMI) of 33.0 to 33.9 in adult 04/07/2021  ? Vitamin D deficiency 04/07/2021  ? OA (osteoarthritis of spine) 01/16/2017  ? S/P lumbar laminectomy 11/02/2016  ? Abnormal CT scan, chest 04/01/2013  ? Upper airway cough syndrome 01/05/2013  ? ? ?Braylynn Ghan,ANGIE, PTA ?07/19/2021, 11:01 AM ? ?[facility] ?[place] ?[address] ?[place], [place], [address] ?Phone: [phone]   Fax:  [phone] ? ?Name: SANTIA LABATE ?MRN: [id number] ?Date of Birth: 01/14/1941 ? ? ? ?

## 2021-07-22 ENCOUNTER — Ambulatory Visit: Payer: No Typology Code available for payment source | Admitting: Physical Therapy

## 2021-07-22 DIAGNOSIS — R262 Difficulty in walking, not elsewhere classified: Secondary | ICD-10-CM

## 2021-07-22 DIAGNOSIS — M545 Low back pain, unspecified: Secondary | ICD-10-CM

## 2021-07-22 DIAGNOSIS — M6281 Muscle weakness (generalized): Secondary | ICD-10-CM

## 2021-07-22 NOTE — Therapy (Signed)
[facility] [place], [place], [address] Phone: [phone]   Fax:  [phone]  Physical Therapy Treatment  Patient Details  Name: Margaret Gordon MRN: [id number] Date of Birth: 09-01-1940 Referring Provider (PT): Sherley Bounds   Encounter Date: 07/22/2021   PT End of Session - 07/22/21 1011     Visit Number 22    Date for PT Re-Evaluation 08/29/21    PT Start Time 0930    PT Stop Time 1015    PT Time Calculation (min) 45 min             Past Medical History:  Diagnosis Date   Anemia    hx   Arthritis    Asthma    GERD (gastroesophageal reflux disease)    History of chicken pox    History of glaucoma    History of hiatal hernia    Hyperlipidemia    Hypertension    Migraines    Pneumonia    hx   Scoliosis     Past Surgical History:  Procedure Laterality Date   LUMBAR LAMINECTOMY/DECOMPRESSION MICRODISCECTOMY Left 11/02/2016   Procedure: Left Lumbar Three-Four, Lumbar Four-Five Laminectomy and Foraminotomy;  Surgeon: Eustace Moore, MD;  Location: [facility];  Service: Neurosurgery;  Laterality: Left;   TONSILLECTOMY     VESICOVAGINAL FISTULA CLOSURE W/ TAH      There were no vitals filed for this visit.   Subjective Assessment - 07/22/21 0923     Subjective feeling good, pian in that favorite spot that won't go away    Currently in Pain? Yes    Pain Score 6     Pain Location Hip    Pain Orientation Left                               [facility] Adult PT Treatment/Exercise - 07/22/21 0001       Lumbar Exercises: Aerobic   Nustep L 5 3m LE only      Lumbar Exercises: Standing   Other Standing Lumbar Exercises 10 x wt ball trunk rotation   hip 3 way HHA on airex alt 20 x 3# ankle wts   Other Standing Lumbar Exercises 5# standing shruggs 10 x, latera flexion and rotation   5# shld ext 2 sets 10     Lumbar Exercises: Seated   Sit to Stand 10 reps   wt ball press.      Lumbar Exercises: Supine   Straight Leg Raise 10 reps   3# 2nd set with abd     Manual Therapy   Manual Therapy Soft tissue mobilization;Passive ROM    Soft tissue mobilization Left ITB    Passive ROM BIL LE and trunk                       PT Short Term Goals - 05/13/21 0935       PT SHORT TERM GOAL #1   Title I with basic HEP    Status Achieved               PT Long Term Goals - 07/22/21 0959       PT LONG TERM GOAL #1   Title I with final HEP.    Baseline evolving as we go    Status Partially Met      PT LONG TERM GOAL #3   Title Patient  will demosntrate lumbar ROM of at least 80% of full in all planes.    Status Achieved      PT LONG TERM GOAL #4   Title Patient will report back pain of <3/10 after performing her normal daily activities Independently.    Status Partially Met                   Plan - 07/22/21 1012     Clinical Impression Statement progressed func standing ex with seated rest needed. some increased pain with standing but decreases with rest/sitting. no changes in LTGs    PT Next Visit Plan progress with standing ex. renewal due             Patient will benefit from skilled therapeutic intervention in order to improve the following deficits and impairments:  Abnormal gait, Decreased range of motion, Difficulty walking, Pain, Decreased activity tolerance, Decreased balance, Decreased mobility, Decreased strength, Impaired sensation, Postural dysfunction, Improper body mechanics, Impaired flexibility  Visit Diagnosis: Difficulty in walking, not elsewhere classified  Acute bilateral low back pain without sciatica  Muscle weakness (generalized)     Problem List Patient Active Problem List   Diagnosis Date Noted   Decreased GFR 05/10/2021   Personal history of colonic polyps 04/07/2021   Thyroid nodule 04/07/2021   GERD (gastroesophageal reflux disease) 04/07/2021   Chronic bilateral low back pain with  right-sided sciatica 04/07/2021   Scoliosis 04/07/2021   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   Osteoarthritis, 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   Hypertension 04/07/2021   High cholesterol 04/07/2021   History of anemia 04/07/2021   Body mass index (BMI) of 33.0 to 33.9 in adult 04/07/2021   Vitamin D deficiency 04/07/2021   OA (osteoarthritis of spine) 01/16/2017   S/P lumbar laminectomy 11/02/2016   Abnormal CT scan, chest 04/01/2013   Upper airway cough syndrome 01/05/2013    Lorinda Copland,ANGIE, PTA 07/22/2021, 10:15 AM  [facility] [place], [place], [address] Phone: [phone]   Fax:  [phone]  Name: Margaret Gordon MRN: [id number] Date of Birth: [birth year]

## 2021-07-26 ENCOUNTER — Encounter: Payer: Self-pay | Admitting: Physical Therapy

## 2021-07-26 ENCOUNTER — Ambulatory Visit: Payer: No Typology Code available for payment source | Admitting: Physical Therapy

## 2021-07-26 DIAGNOSIS — M6281 Muscle weakness (generalized): Secondary | ICD-10-CM

## 2021-07-26 DIAGNOSIS — R262 Difficulty in walking, not elsewhere classified: Secondary | ICD-10-CM

## 2021-07-26 DIAGNOSIS — M545 Low back pain, unspecified: Secondary | ICD-10-CM

## 2021-07-26 NOTE — Therapy (Signed)
[place] [place], [place], [address] Phone: [phone]   Fax:  [phone]  Physical Therapy Treatment  Patient Details  Name: Margaret Gordon MRN: [id number] Date of Birth: 20-Sep-1940 Referring Provider (PT): Sherley Bounds   Encounter Date: 07/26/2021   PT End of Session - 07/26/21 1018     Visit Number 23    Date for PT Re-Evaluation 08/29/21    PT Start Time 0933    PT Stop Time 1013    PT Time Calculation (min) 40 min    Activity Tolerance Patient tolerated treatment well    Behavior During Therapy [facility] for tasks assessed/performed             Past Medical History:  Diagnosis Date   Anemia    hx   Arthritis    Asthma    GERD (gastroesophageal reflux disease)    History of chicken pox    History of glaucoma    History of hiatal hernia    Hyperlipidemia    Hypertension    Migraines    Pneumonia    hx   Scoliosis     Past Surgical History:  Procedure Laterality Date   LUMBAR LAMINECTOMY/DECOMPRESSION MICRODISCECTOMY Left 11/02/2016   Procedure: Left Lumbar Three-Four, Lumbar Four-Five Laminectomy and Foraminotomy;  Surgeon: Eustace Moore, MD;  Location: [facility];  Service: Neurosurgery;  Laterality: Left;   TONSILLECTOMY     VESICOVAGINAL FISTULA CLOSURE W/ TAH      There were no vitals filed for this visit.   Subjective Assessment - 07/26/21 0943     Subjective Doing OK I really notice things on the left when I overdo it like standing too much. Bending and stretching have helped.    Patient Stated Goals Be able to take care of herself and move without hurting.    Currently in Pain? Yes    Pain Score 8    not consistent with behavior and non verbal communication   Pain Location Back    Pain Orientation Left;Lower    Pain Descriptors / Indicators Constant                               [facility] Adult PT Treatment/Exercise - 07/26/21 0001       Lumbar Exercises:  Stretches   Other Lumbar Stretch Exercise SKTC 10x10 second holds B; QL stretch 3x30 seconds    Other Lumbar Stretch Exercise DKTC 5x5 second holds; lumbar rotations 5x5 second holds      Lumbar Exercises: Aerobic   Nustep L 5 66m LE only      Lumbar Exercises: Standing   Other Standing Lumbar Exercises RFIS x10      Lumbar Exercises: Seated   Other Seated Lumbar Exercises seated marches with TA set 1x20; TA set 1x15 3 second holds      Lumbar Exercises: Supine   Other Supine Lumbar Exercises PPT 1x15 3 second holds                     PT Education - 07/26/21 1017     Education Details exercise form and purpose    Person(s) Educated Patient    Methods Explanation    Comprehension Verbalized understanding              PT Short Term Goals - 05/13/21 0935       PT SHORT TERM GOAL #1  Title I with basic HEP    Status Achieved               PT Long Term Goals - 07/22/21 0959       PT LONG TERM GOAL #1   Title I with final HEP.    Baseline evolving as we go    Status Partially Met      PT LONG TERM GOAL #3   Title Patient will demosntrate lumbar ROM of at least 80% of full in all planes.    Status Achieved      PT LONG TERM GOAL #4   Title Patient will report back pain of <3/10 after performing her normal daily activities Independently.    Status Partially Met                   Plan - 07/26/21 1018     Clinical Impression Statement [name] arrives today doing OK, reports stretches have been helping the most but not 100% compliant with HEP, I really encouraged better adherence to exercise at home. Her pain was higher than typical so we focused on flexion based stretches and core activation, has an extremely weak core and this may be part of the issue here. May be close to maximizing benefit from PT will defer to evaluating therapist here however.    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 She is status post L3-4 L4-5 decompressive hemilaminectomy on the left.    Examination-Activity Limitations Reach Overhead;Bend;Locomotion Level;Transfers;Hygiene/Grooming;Dressing;Squat;Sleep    Examination-Participation Restrictions Cleaning;Meal Prep;Interpersonal Relationship;Laundry    Stability/Clinical Decision Making Evolving/Moderate complexity    Clinical Decision Making Moderate    Rehab Potential Good    PT Frequency 2x / week    PT Duration Other (comment)    PT Treatment/Interventions ADLs/Self Care Home Management;Iontophoresis 75m/ml Dexamethasone;Gait training;Stair training;Functional mobility training;Moist Heat;Traction;Therapeutic activities;Therapeutic exercise;Balance training;Neuromuscular re-education;Manual techniques;Dry needling;Passive range of motion;Patient/family education    PT Next Visit Plan progress with standing ex. renewal due in june    [place] Agree with Plan of Care Patient             Patient will benefit from skilled therapeutic intervention in order to improve the following deficits and impairments:  Abnormal gait, Decreased range of motion, Difficulty walking, Pain, Decreased activity tolerance, Decreased balance, Decreased mobility, Decreased strength, Impaired sensation, Postural dysfunction, Improper body mechanics, Impaired flexibility  Visit Diagnosis: Difficulty in walking, not elsewhere classified  Acute bilateral low back pain without sciatica  Muscle weakness (generalized)     Problem List Patient Active Problem List   Diagnosis Date Noted   Decreased GFR 05/10/2021   Personal history of colonic polyps 04/07/2021   Thyroid nodule 04/07/2021   GERD (gastroesophageal reflux disease) 04/07/2021   Chronic bilateral low back pain with right-sided sciatica 04/07/2021   Scoliosis 04/07/2021   OA (osteoarthritis) of  shoulder 04/07/2021   Osteoarthritis, 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   Hypertension 04/07/2021   High cholesterol 04/07/2021   History of anemia 04/07/2021   Body mass index (BMI) of 33.0 to 33.9 in adult 04/07/2021   Vitamin D deficiency 04/07/2021   OA (osteoarthritis of spine) 01/16/2017   S/P lumbar laminectomy 11/02/2016   Abnormal CT scan, chest 04/01/2013   Upper airway cough syndrome 01/05/2013   KAnn LionsPT, DPT, PN2   [facility]  [address] [place], [place], [address] Phone: [phone]   Fax:  [phone]  Name: Margaret Gordon MRN: [id number] Date of Birth: 04/03/1940

## 2021-07-29 ENCOUNTER — Encounter: Payer: Self-pay | Admitting: Physical Therapy

## 2021-07-29 ENCOUNTER — Ambulatory Visit: Payer: No Typology Code available for payment source | Admitting: Physical Therapy

## 2021-07-29 DIAGNOSIS — R262 Difficulty in walking, not elsewhere classified: Secondary | ICD-10-CM

## 2021-07-29 DIAGNOSIS — M545 Low back pain, unspecified: Secondary | ICD-10-CM

## 2021-07-29 DIAGNOSIS — R2681 Unsteadiness on feet: Secondary | ICD-10-CM

## 2021-07-29 DIAGNOSIS — M6281 Muscle weakness (generalized): Secondary | ICD-10-CM

## 2021-07-29 DIAGNOSIS — R42 Dizziness and giddiness: Secondary | ICD-10-CM

## 2021-07-29 NOTE — Therapy (Signed)
[place]. [place], [place], [address] Phone: [phone]   Fax:  [phone]  Physical Therapy Treatment  Patient Details  Name: Margaret Gordon MRN: [id number] Date of Birth: August 05, 1940 Referring Provider (PT): Sherley Bounds   Encounter Date: 07/29/2021   PT End of Session - 07/29/21 1002     Visit Number 24    Date for PT Re-Evaluation 08/29/21    PT Start Time 0932    PT Stop Time 1010    PT Time Calculation (min) 38 min    Activity Tolerance Patient tolerated treatment well    Behavior During Therapy [facility] for tasks assessed/performed             Past Medical History:  Diagnosis Date   Anemia    hx   Arthritis    Asthma    GERD (gastroesophageal reflux disease)    History of chicken pox    History of glaucoma    History of hiatal hernia    Hyperlipidemia    Hypertension    Migraines    Pneumonia    hx   Scoliosis     Past Surgical History:  Procedure Laterality Date   LUMBAR LAMINECTOMY/DECOMPRESSION MICRODISCECTOMY Left 11/02/2016   Procedure: Left Lumbar Three-Four, Lumbar Four-Five Laminectomy and Foraminotomy;  Surgeon: Eustace Moore, MD;  Location: [facility];  Service: Neurosurgery;  Laterality: Left;   TONSILLECTOMY     VESICOVAGINAL FISTULA CLOSURE W/ TAH      There were no vitals filed for this visit.                      [facility] Adult PT Treatment/Exercise - 07/29/21 0001       Ambulation/Gait   Gait Comments Ambulated after strengthening, 1 x 120', no AD, upright posutre.      Lumbar Exercises: Aerobic   Recumbent Bike L3.5 x 6 minutes      Lumbar Exercises: Standing   Row Strengthening;Power tower;Both;20 reps    Row Limitations 5#    Shoulder Extension Strengthening;Power Tower;Both;20 reps    Shoulder Extension Limitations 5#    Other Standing Lumbar Exercises 3 way hip on airex pad iwth BUE HH support, 3# resistance.                        PT Short Term Goals - 05/13/21 0935       PT SHORT TERM GOAL #1   Title I with basic HEP    Status Achieved               PT Long Term Goals - 07/29/21 0946       PT LONG TERM GOAL #1   Title I with final HEP.    Baseline Updating to include more equipment as patient would like to go to gym after D/C.    Time 2    Period Weeks    Status On-going      PT LONG TERM GOAL #3   Title Patient will demosntrate lumbar ROM of at least 80% of full in all planes.    Status Achieved      PT LONG TERM GOAL #4   Title Patient will report back pain of <3/10 after performing her normal daily activities Independently.    Status Partially Met                   Plan - 07/29/21 1003  Clinical Impression Statement patient reports overall improvement. Knee is still bothering her, she is following up with the Dr. Treatment emphasized strength and posture. Discussed D/C in the next week or two and patient would like instruction on gym equipment.    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 She is status post L3-4 L4-5 decompressive hemilaminectomy on the left.    Examination-Activity Limitations Reach Overhead;Bend;Locomotion Level;Transfers;Hygiene/Grooming;Dressing;Squat;Sleep    Examination-Participation Restrictions Cleaning;Meal Prep;Interpersonal Relationship;Laundry    Stability/Clinical Decision Making Evolving/Moderate complexity    Clinical Decision Making Low    Rehab Potential Good    PT Frequency 2x / week    PT Duration Other (comment)    PT Treatment/Interventions ADLs/Self Care Home Management;Iontophoresis 3m/ml Dexamethasone;Gait training;Stair training;Functional mobility training;Moist Heat;Traction;Therapeutic activities;Therapeutic exercise;Balance training;Neuromuscular re-education;Manual techniques;Dry needling;Passive  range of motion;Patient/family education    PT Next Visit Plan progress with standing ex. gym equipment    PT [place] Agree with Plan of Care Patient             Patient will benefit from skilled therapeutic intervention in order to improve the following deficits and impairments:  Abnormal gait, Decreased range of motion, Difficulty walking, Pain, Decreased activity tolerance, Decreased balance, Decreased mobility, Decreased strength, Impaired sensation, Postural dysfunction, Improper body mechanics, Impaired flexibility  Visit Diagnosis: Difficulty in walking, not elsewhere classified  Acute bilateral low back pain without sciatica  Muscle weakness (generalized)  Unsteadiness on feet  Dizziness and giddiness     Problem List Patient Active Problem List   Diagnosis Date Noted   Decreased GFR 05/10/2021   Personal history of colonic polyps 04/07/2021   Thyroid nodule 04/07/2021   GERD (gastroesophageal reflux disease) 04/07/2021   Chronic bilateral low back pain with right-sided sciatica 04/07/2021   Scoliosis 04/07/2021   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   Osteoarthritis, 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   Hypertension 04/07/2021   High cholesterol 04/07/2021   History of anemia 04/07/2021   Body mass index (BMI) of 33.0 to 33.9 in adult 04/07/2021   Vitamin D deficiency 04/07/2021   OA (osteoarthritis of spine) 01/16/2017   S/P lumbar laminectomy 11/02/2016   Abnormal CT scan, chest 04/01/2013   Upper airway cough syndrome 01/05/2013    SMarcelina Morel DPT 07/29/2021, 10:13 AM  [place] [address]Phone: [phone]  Fax:  [phone] Name: Margaret TITSWORTHMRN: [id number] of Birth: 501-Apr-1942

## 2021-08-02 ENCOUNTER — Ambulatory Visit: Payer: No Typology Code available for payment source | Admitting: Physical Therapy

## 2021-08-02 DIAGNOSIS — R262 Difficulty in walking, not elsewhere classified: Secondary | ICD-10-CM

## 2021-08-02 DIAGNOSIS — M6281 Muscle weakness (generalized): Secondary | ICD-10-CM

## 2021-08-02 DIAGNOSIS — M545 Low back pain, unspecified: Secondary | ICD-10-CM

## 2021-08-02 NOTE — Patient Instructions (Signed)

## 2021-08-02 NOTE — Therapy (Addendum)
[place]. [place], [place], [address] Phone: [phone]   Fax:  [phone]  Physical Therapy Treatment  Patient Details  Name: Margaret Gordon MRN: [id number] Date of Birth: 1940-07-08 Referring Provider (PT): Sherley Bounds   Encounter Date: 08/02/2021   PT End of Session - 08/02/21 1313     Visit Number 25    Date for PT Re-Evaluation 08/29/21    PT Start Time 43    PT Stop Time 1315    PT Time Calculation (min) 45 min             Past Medical History:  Diagnosis Date   Anemia    hx   Arthritis    Asthma    GERD (gastroesophageal reflux disease)    History of chicken pox    History of glaucoma    History of hiatal hernia    Hyperlipidemia    Hypertension    Migraines    Pneumonia    hx   Scoliosis     Past Surgical History:  Procedure Laterality Date   LUMBAR LAMINECTOMY/DECOMPRESSION MICRODISCECTOMY Left 11/02/2016   Procedure: Left Lumbar Three-Four, Lumbar Four-Five Laminectomy and Foraminotomy;  Surgeon: Eustace Moore, MD;  Location: [facility];  Service: Neurosurgery;  Laterality: Left;   TONSILLECTOMY     VESICOVAGINAL FISTULA CLOSURE W/ TAH      There were no vitals filed for this visit.   Subjective Assessment - 08/02/21 1226     Subjective i can go to [employer] with my daughter. not sure ready for D/C but will go with what ever is best. Had massage and it was so helpful    Currently in Pain? Yes    Pain Score 6     Pain Location Back    Pain Orientation Left;Lower                               [facility] Adult PT Treatment/Exercise - 08/02/21 0001       Lumbar Exercises: Aerobic   Nustep L 5 6 min LE only      Lumbar Exercises: Machines for Strengthening   Cybex Lumbar Extension black tband trunk flex and ext 2 sets 10    Cybex Knee Extension 5# 2x10    Cybex Knee Flexion 20# 2x10      Lumbar Exercises: Standing   Other Standing Lumbar Exercises  3 way hip on airex pad iwth BUE HH support, 3# resistance.    Other Standing Lumbar Exercises standing lateral flexion 5 # 15 x each              Trigger Point Dry Needling - 08/02/21 0001     Consent Given? Yes    Education Handout Provided Yes    Muscles Treated Back/Hip Gluteus maximus    Gluteus Maximus Response Twitch response elicited;Palpable increased muscle length                   PT Education - 08/02/21 1251     Education Details gym equipment- wt,safety, use and list of machines    Person(s) Educated Patient    Methods Explanation;Demonstration    Comprehension Verbalized understanding;Returned demonstration              PT Short Term Goals - 05/13/21 0935       PT SHORT TERM GOAL #1   Title I with basic HEP  Status Achieved               PT Long Term Goals - 07/29/21 0946       PT LONG TERM GOAL #1   Title I with final HEP.    Baseline Updating to include more equipment as patient would like to go to gym after D/C.    Time 2    Period Weeks    Status On-going      PT LONG TERM GOAL #3   Title Patient will demosntrate lumbar ROM of at least 80% of full in all planes.    Status Achieved      PT LONG TERM GOAL #4   Title Patient will report back pain of <3/10 after performing her normal daily activities Independently.    Status Partially Met                   Plan - 08/02/21 1313     Clinical Impression Statement educ pt on starting gym transition- ex sheet given with wt and reps. added DN today for spot in left hip.    PT Treatment/Interventions ADLs/Self Care Home Management;Iontophoresis 81m/ml Dexamethasone;Gait training;Stair training;Functional mobility training;Moist Heat;Traction;Therapeutic activities;Therapeutic exercise;Balance training;Neuromuscular re-education;Manual techniques;Dry needling;Passive range of motion;Patient/family education    PT Next Visit Plan assess and progress              Patient will benefit from skilled therapeutic intervention in order to improve the following deficits and impairments:  Abnormal gait, Decreased range of motion, Difficulty walking, Pain, Decreased activity tolerance, Decreased balance, Decreased mobility, Decreased strength, Impaired sensation, Postural dysfunction, Improper body mechanics, Impaired flexibility  Visit Diagnosis: Difficulty in walking, not elsewhere classified  Acute bilateral low back pain without sciatica  Muscle weakness (generalized)     Problem List Patient Active Problem List   Diagnosis Date Noted   Decreased GFR 05/10/2021   Personal history of colonic polyps 04/07/2021   Thyroid nodule 04/07/2021   GERD (gastroesophageal reflux disease) 04/07/2021   Chronic bilateral low back pain with right-sided sciatica 04/07/2021   Scoliosis 04/07/2021   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   Osteoarthritis, 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   Hypertension 04/07/2021   High cholesterol 04/07/2021   History of anemia 04/07/2021   Body mass index (BMI) of 33.0 to 33.9 in adult 04/07/2021   Vitamin D deficiency 04/07/2021   OA (osteoarthritis of spine) 01/16/2017   S/P lumbar laminectomy 11/02/2016   Abnormal CT scan, chest 04/01/2013   Upper airway cough syndrome 01/05/2013    ASumner Boast PT 08/02/2021, 1:44 PM  [place] [address]Phone: [phone]  Fax:  [phone] Name: Margaret GELLESMRN: [id number] of Birth: [birth year]

## 2021-08-09 ENCOUNTER — Ambulatory Visit: Payer: No Typology Code available for payment source | Attending: Neurological Surgery | Admitting: Physical Therapy

## 2021-08-09 DIAGNOSIS — R42 Dizziness and giddiness: Secondary | ICD-10-CM | POA: Diagnosis not present

## 2021-08-09 DIAGNOSIS — R262 Difficulty in walking, not elsewhere classified: Secondary | ICD-10-CM | POA: Diagnosis present

## 2021-08-09 DIAGNOSIS — R2681 Unsteadiness on feet: Secondary | ICD-10-CM | POA: Diagnosis not present

## 2021-08-09 DIAGNOSIS — M6281 Muscle weakness (generalized): Secondary | ICD-10-CM | POA: Insufficient documentation

## 2021-08-09 DIAGNOSIS — M545 Low back pain, unspecified: Secondary | ICD-10-CM | POA: Insufficient documentation

## 2021-08-09 NOTE — Therapy (Signed)
[facility]. [place], [place], [address] Phone: [phone]   Fax:  [phone]  Physical Therapy Treatment  Patient Details  Name: Margaret Gordon MRN: [id number] Date of Birth: 02/02/1941 Referring Provider (PT): Sherley Bounds   Encounter Date: 08/09/2021   PT End of Session - 08/09/21 1013     Visit Number 26    Date for PT Re-Evaluation 08/29/21    PT Start Time 0935    PT Stop Time 19    PT Time Calculation (min) 45 min             Past Medical History:  Diagnosis Date   Anemia    hx   Arthritis    Asthma    GERD (gastroesophageal reflux disease)    History of chicken pox    History of glaucoma    History of hiatal hernia    Hyperlipidemia    Hypertension    Migraines    Pneumonia    hx   Scoliosis     Past Surgical History:  Procedure Laterality Date   LUMBAR LAMINECTOMY/DECOMPRESSION MICRODISCECTOMY Left 11/02/2016   Procedure: Left Lumbar Three-Four, Lumbar Four-Five Laminectomy and Foraminotomy;  Surgeon: Eustace Moore, MD;  Location: [facility];  Service: Neurosurgery;  Laterality: Left;   TONSILLECTOMY     VESICOVAGINAL FISTULA CLOSURE W/ TAH      There were no vitals filed for this visit.   Subjective Assessment - 08/09/21 0937     Subjective very busy and unfortunately have not been to gym, but going too. needles are wonderful- knots gone    Currently in Pain? Yes    Pain Score 6     Pain Location Back                               [facility] Adult PT Treatment/Exercise - 08/09/21 0001       Lumbar Exercises: Aerobic   Nustep L 5 6 min LE only      Lumbar Exercises: Standing   Other Standing Lumbar Exercises resited gait 5 x fwd and back and 3 x each side    Other Standing Lumbar Exercises standing pulley shld ext and row 2 sets 10 10#      Lumbar Exercises: Supine   Ab Set 15 reps    Other Supine Lumbar Exercises feet on ball bridge, KTC and obl 15 x  each      Manual Therapy   Manual Therapy Passive ROM    Manual therapy comments increased flexibility noted    Passive ROM LE and trunk                       PT Short Term Goals - 05/13/21 0935       PT SHORT TERM GOAL #1   Title I with basic HEP    Status Achieved               PT Long Term Goals - 07/29/21 0946       PT LONG TERM GOAL #1   Title I with final HEP.    Baseline Updating to include more equipment as patient would like to go to gym after D/C.    Time 2    Period Weeks    Status On-going      PT LONG TERM GOAL #3   Title Patient will demosntrate lumbar  ROM of at least 80% of full in all planes.    Status Achieved      PT LONG TERM GOAL #4   Title Patient will report back pain of <3/10 after performing her normal daily activities Independently.    Status Partially Met                   Plan - 08/09/21 1013     Clinical Impression Statement pt arrived feeling better, increased upright posture and walking better. pt has not tried gym yet but daughter had been OOT and she goes with her so in the plans    PT Treatment/Interventions ADLs/Self Care Home Management;Iontophoresis 31m/ml Dexamethasone;Gait training;Stair training;Functional mobility training;Moist Heat;Traction;Therapeutic activities;Therapeutic exercise;Balance training;Neuromuscular re-education;Manual techniques;Dry needling;Passive range of motion;Patient/family education    PT Next Visit Plan assess goals             Patient will benefit from skilled therapeutic intervention in order to improve the following deficits and impairments:     Visit Diagnosis: Acute bilateral low back pain without sciatica  Muscle weakness (generalized)     Problem List Patient Active Problem List   Diagnosis Date Noted   Decreased GFR 05/10/2021   Personal history of colonic polyps 04/07/2021   Thyroid nodule 04/07/2021   GERD (gastroesophageal reflux disease) 04/07/2021    Chronic bilateral low back pain with right-sided sciatica 04/07/2021   Scoliosis 04/07/2021   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   Osteoarthritis, 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   Hypertension 04/07/2021   High cholesterol 04/07/2021   History of anemia 04/07/2021   Body mass index (BMI) of 33.0 to 33.9 in adult 04/07/2021   Vitamin D deficiency 04/07/2021   OA (osteoarthritis of spine) 01/16/2017   S/P lumbar laminectomy 11/02/2016   Abnormal CT scan, chest 04/01/2013   Upper airway cough syndrome 01/05/2013    Teshaun Olarte,ANGIE, PTA 08/09/2021, 10:15 AM  [place] [address]Phone: [phone]  Fax:  [phone] Name: Margaret VOGELGESANGMRN: [id number] of Birth: [birth year]

## 2021-08-12 ENCOUNTER — Ambulatory Visit: Payer: No Typology Code available for payment source | Admitting: Physical Therapy

## 2021-08-12 DIAGNOSIS — M545 Low back pain, unspecified: Secondary | ICD-10-CM | POA: Diagnosis not present

## 2021-08-12 DIAGNOSIS — M6281 Muscle weakness (generalized): Secondary | ICD-10-CM

## 2021-08-12 DIAGNOSIS — R262 Difficulty in walking, not elsewhere classified: Secondary | ICD-10-CM

## 2021-08-12 NOTE — Therapy (Signed)
[facility]. [place], [place], [address] Phone: [phone]   Fax:  [phone]  Physical Therapy Treatment  Patient Details  Name: Margaret Gordon MRN: [id number] Date of Birth: 10/14/1940 Referring Provider (PT): Sherley Bounds   Encounter Date: 08/12/2021   PT End of Session - 08/12/21 1007     Visit Number 80    Date for PT Re-Evaluation 08/29/21    PT Start Time 0930    PT Stop Time 1015    PT Time Calculation (min) 45 min             Past Medical History:  Diagnosis Date   Anemia    hx   Arthritis    Asthma    GERD (gastroesophageal reflux disease)    History of chicken pox    History of glaucoma    History of hiatal hernia    Hyperlipidemia    Hypertension    Migraines    Pneumonia    hx   Scoliosis     Past Surgical History:  Procedure Laterality Date   LUMBAR LAMINECTOMY/DECOMPRESSION MICRODISCECTOMY Left 11/02/2016   Procedure: Left Lumbar Three-Four, Lumbar Four-Five Laminectomy and Foraminotomy;  Surgeon: Eustace Moore, MD;  Location: [facility];  Service: Neurosurgery;  Laterality: Left;   TONSILLECTOMY     VESICOVAGINAL FISTULA CLOSURE W/ TAH      There were no vitals filed for this visit.   Subjective Assessment - 08/12/21 0932     Subjective feeling pretty good this morning    Currently in Pain? Yes    Pain Score 5     Pain Location Back    Pain Orientation Left;Lower                               [facility] Adult PT Treatment/Exercise - 08/12/21 0001       Lumbar Exercises: Aerobic   Nustep L 5 6 min LE only      Lumbar Exercises: Machines for Strengthening   Cybex Lumbar Extension black tband trunk flex and ext 2 sets 10      Lumbar Exercises: Standing   Heel Raises 20 reps   black bar   Other Standing Lumbar Exercises wt ball trunk ext and rotation 10 x each    Other Standing Lumbar Exercises standing pulley shld ext and row 2 sets 10 10#       Modalities   Modalities Iontophoresis      Iontophoresis   Type of Iontophoresis Dexamethasone    Location Left SI    Dose 1.1 cc    Time 4 hour leave on patch      Manual Therapy   Manual Therapy Passive ROM;Soft tissue mobilization    Soft tissue mobilization left buttock/SI   very tender   Passive ROM LE and trunk                       PT Short Term Goals - 05/13/21 0935       PT SHORT TERM GOAL #1   Title I with basic HEP    Status Achieved               PT Long Term Goals - 08/12/21 0944       PT LONG TERM GOAL #1   Title I with final HEP.    Status Partially Met  PT LONG TERM GOAL #3   Title Patient will demosntrate lumbar ROM of at least 80% of full in all planes.    Status Achieved      PT LONG TERM GOAL #4   Title Patient will report back pain of <3/10 after performing her normal daily activities Independently.    Status Partially Met                   Plan - 08/12/21 1007     Clinical Impression Statement verall pt is feeling better but still struggles with left back/buttock pain with prolinged standing ( 5-145 min varies with actvity). added trail of ionto    PT Treatment/Interventions ADLs/Self Care Home Management;Iontophoresis 62m/ml Dexamethasone;Gait training;Stair training;Functional mobility training;Moist Heat;Traction;Therapeutic activities;Therapeutic exercise;Balance training;Neuromuscular re-education;Manual techniques;Dry needling;Passive range of motion;Patient/family education    PT Next Visit Plan asses ionto             Patient will benefit from skilled therapeutic intervention in order to improve the following deficits and impairments:  Abnormal gait, Decreased range of motion, Difficulty walking, Pain, Decreased activity tolerance, Decreased balance, Decreased mobility, Decreased strength, Impaired sensation, Postural dysfunction, Improper body mechanics, Impaired flexibility  Visit Diagnosis: Acute  bilateral low back pain without sciatica  Muscle weakness (generalized)  Difficulty in walking, not elsewhere classified     Problem List Patient Active Problem List   Diagnosis Date Noted   Decreased GFR 05/10/2021   Personal history of colonic polyps 04/07/2021   Thyroid nodule 04/07/2021   GERD (gastroesophageal reflux disease) 04/07/2021   Chronic bilateral low back pain with right-sided sciatica 04/07/2021   Scoliosis 04/07/2021   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   Osteoarthritis, 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   Hypertension 04/07/2021   High cholesterol 04/07/2021   History of anemia 04/07/2021   Body mass index (BMI) of 33.0 to 33.9 in adult 04/07/2021   Vitamin D deficiency 04/07/2021   OA (osteoarthritis of spine) 01/16/2017   S/P lumbar laminectomy 11/02/2016   Abnormal CT scan, chest 04/01/2013   Upper airway cough syndrome 01/05/2013    Margaret Gordon,Margaret Gordon, PTA 08/12/2021, 10:09 AM  [place] [address]Phone: [phone]  Fax:  [phone] Name: Margaret POLANCOMRN: [id number] of Birth: [birth year]

## 2021-08-16 ENCOUNTER — Ambulatory Visit: Payer: No Typology Code available for payment source | Admitting: Physical Therapy

## 2021-08-16 ENCOUNTER — Encounter: Payer: Self-pay | Admitting: Physical Therapy

## 2021-08-16 DIAGNOSIS — M545 Low back pain, unspecified: Secondary | ICD-10-CM

## 2021-08-16 DIAGNOSIS — M6281 Muscle weakness (generalized): Secondary | ICD-10-CM

## 2021-08-16 DIAGNOSIS — R2681 Unsteadiness on feet: Secondary | ICD-10-CM

## 2021-08-16 DIAGNOSIS — R262 Difficulty in walking, not elsewhere classified: Secondary | ICD-10-CM

## 2021-08-16 DIAGNOSIS — R42 Dizziness and giddiness: Secondary | ICD-10-CM

## 2021-08-16 NOTE — Therapy (Signed)
[facility]. [place], [place], [address] Phone: [phone]   Fax:  [phone]  Physical Therapy Treatment  Patient Details  Name: Margaret Gordon MRN: [id number] Date of Birth: 16-Jul-1940 Referring Provider (PT): Sherley Bounds   Encounter Date: 08/16/2021   PT End of Session - 08/16/21 0942     Visit Number 28    Date for PT Re-Evaluation 08/29/21    PT Start Time 0931    PT Stop Time 1011    PT Time Calculation (min) 40 min    Activity Tolerance Patient tolerated treatment well    Behavior During Therapy [facility] for tasks assessed/performed             Past Medical History:  Diagnosis Date   Anemia    hx   Arthritis    Asthma    GERD (gastroesophageal reflux disease)    History of chicken pox    History of glaucoma    History of hiatal hernia    Hyperlipidemia    Hypertension    Migraines    Pneumonia    hx   Scoliosis     Past Surgical History:  Procedure Laterality Date   LUMBAR LAMINECTOMY/DECOMPRESSION MICRODISCECTOMY Left 11/02/2016   Procedure: Left Lumbar Three-Four, Lumbar Four-Five Laminectomy and Foraminotomy;  Surgeon: Eustace Moore, MD;  Location: [facility];  Service: Neurosurgery;  Laterality: Left;   TONSILLECTOMY     VESICOVAGINAL FISTULA CLOSURE W/ TAH      There were no vitals filed for this visit.   Subjective Assessment - 08/16/21 0934     Subjective Patient reports that the [place] did help. She is starting at the gym today and has the exercises prescribed here and will work with a Clinical research associate.    Patient is accompained by: Family member   husband   Pertinent History She is status post L3-4 L4-5 decompressive hemilaminectomy on the left. Scoliosis    Limitations Lifting;Standing;Walking;House hold activities    How long can you sit comfortably? difficult to rise after.    How long can you walk comfortably? Sometimes it hurts immediatly    Patient Stated Goals Be able to take care  of herself and move without hurting.    Currently in Pain? No/denies    Pain Onset More than a month ago    Pain Onset More than a month ago                               [facility] Adult PT Treatment/Exercise - 08/16/21 0001       Lumbar Exercises: Stretches   Single Knee to Chest Stretch Right;Left;3 reps;10 seconds    Double Knee to Chest Stretch 3 reps;10 seconds    Lower Trunk Rotation 3 reps;10 seconds    Lower Trunk Rotation Limitations B    ITB Stretch Right;Left;3 reps;10 seconds      Lumbar Exercises: Aerobic   Nustep L 5 6 min LE only      Lumbar Exercises: Machines for Strengthening   Cybex Lumbar Extension black tband trunk flex and ext 2 sets 10      Lumbar Exercises: Standing   Row Strengthening;Both;20 reps    Row Limitations 5# 2 x 10 reps    Shoulder Extension Strengthening;Power Tower;Both;20 reps    Shoulder Extension Limitations 5# 2 x 10 reps  PT Short Term Goals - 05/13/21 0935       PT SHORT TERM GOAL #1   Title I with basic HEP    Status Achieved               PT Long Term Goals - 08/16/21 0949       PT LONG TERM GOAL #1   Title I with final HEP.    Baseline Going to gym today to start/assess HEP    Time 2    Period Weeks    Status On-going      PT LONG TERM GOAL #3   Title Patient will demosntrate lumbar ROM of at least 80% of full in all planes.    Status Achieved      PT LONG TERM GOAL #4   Title Patient will report back pain of <3/10 after performing her normal daily activities Independently.    Baseline Has been rising higher than 3/10, but improved the past couple weeks.    Time 2    Period Weeks    Status Partially Met                   Plan - 08/16/21 0943     Clinical Impression Statement Patient reports good results from the [place], declined to use today, but may need again next week. She is starting to the gym today. Has written exercises and plans to  consult a trainer, so treatment focused on strength and ambulation in preparation for her conference next week.    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 She is status post L3-4 L4-5 decompressive hemilaminectomy on the left.    Examination-Activity Limitations Reach Overhead;Bend;Locomotion Level;Transfers;Hygiene/Grooming;Dressing;Squat;Sleep    Stability/Clinical Decision Making Evolving/Moderate complexity    Clinical Decision Making Low    Rehab Potential Good    PT Frequency 2x / week    PT Duration 2 weeks    PT Treatment/Interventions ADLs/Self Care Home Management;Iontophoresis 4mg /ml Dexamethasone;Gait training;Stair training;Functional mobility training;Moist Heat;Traction;Therapeutic activities;Therapeutic exercise;Balance training;Neuromuscular re-education;Manual techniques;Dry needling;Passive range of motion;Patient/family education    PT Next Visit Plan How did it go at the gym?    PT Home Exercise Plan [id number]    Consulted and Agree with Plan of Care Patient             Patient will benefit from skilled therapeutic intervention in order to improve the following deficits and impairments:  Abnormal gait, Decreased range of motion, Difficulty walking, Pain, Decreased activity tolerance, Decreased balance, Decreased mobility, Decreased strength, Impaired sensation, Postural dysfunction, Improper body mechanics, Impaired flexibility  Visit Diagnosis: Acute bilateral low back pain without sciatica  Muscle weakness (generalized)  Difficulty in walking, not elsewhere classified  Unsteadiness on feet  Dizziness and giddiness     Problem List Patient Active Problem List   Diagnosis Date Noted   Decreased GFR 05/10/2021   Personal history of colonic polyps 04/07/2021   Thyroid nodule 04/07/2021   GERD (gastroesophageal reflux disease) 04/07/2021   Chronic bilateral low back pain with right-sided sciatica 04/07/2021    Scoliosis 04/07/2021   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   Osteoarthritis, 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   Hypertension 04/07/2021   High cholesterol 04/07/2021   History of anemia 04/07/2021   Body mass index (BMI) of 33.0 to 33.9 in adult 04/07/2021   Vitamin D deficiency 04/07/2021   OA (osteoarthritis of spine) 01/16/2017   S/P lumbar laminectomy 11/02/2016   Abnormal CT scan, chest 04/01/2013   Upper  airway cough syndrome 01/05/2013    Marcelina Morel, DPT 08/16/2021, 10:13 AM  [place]. [place], [place], [address] Phone: [phone]   Fax:  [phone]  Name: Margaret Gordon MRN: [id number] Date of Birth: 1940-05-08

## 2021-08-19 ENCOUNTER — Ambulatory Visit: Payer: No Typology Code available for payment source | Admitting: Physical Therapy

## 2021-08-19 DIAGNOSIS — M6281 Muscle weakness (generalized): Secondary | ICD-10-CM

## 2021-08-19 DIAGNOSIS — R262 Difficulty in walking, not elsewhere classified: Secondary | ICD-10-CM

## 2021-08-19 DIAGNOSIS — M545 Low back pain, unspecified: Secondary | ICD-10-CM

## 2021-08-19 NOTE — Therapy (Signed)
[place]. [place], [place], [address] Phone: [phone]   Fax:  [phone]  Physical Therapy Treatment  Patient Details  Name: Margaret Gordon MRN: [id number] Date of Birth: [birth year] Referring Provider (PT): Sherley Bounds   Encounter Date: 08/19/2021   PT End of Session - 08/19/21 1010     Visit Number 83    Date for PT Re-Evaluation 08/29/21    PT Start Time 0930    PT Stop Time 1015    PT Time Calculation (min) 45 min             Past Medical History:  Diagnosis Date   Anemia    hx   Arthritis    Asthma    GERD (gastroesophageal reflux disease)    History of chicken pox    History of glaucoma    History of hiatal hernia    Hyperlipidemia    Hypertension    Migraines    Pneumonia    hx   Scoliosis     Past Surgical History:  Procedure Laterality Date   LUMBAR LAMINECTOMY/DECOMPRESSION MICRODISCECTOMY Left 11/02/2016   Procedure: Left Lumbar Three-Four, Lumbar Four-Five Laminectomy and Foraminotomy;  Surgeon: Eustace Moore, MD;  Location: [facility];  Service: Neurosurgery;  Laterality: Left;   TONSILLECTOMY     VESICOVAGINAL FISTULA CLOSURE W/ TAH      There were no vitals filed for this visit.   Subjective Assessment - 08/19/21 0930     Subjective went to gym tuesday and it went well. yesterday and today hurting. definately want a patch    Currently in Pain? Yes    Pain Score 7     Pain Location Back    Pain Orientation Left                               [facility] Adult PT Treatment/Exercise - 08/19/21 0001       Lumbar Exercises: Aerobic   Nustep L 5 6 min LE only      Lumbar Exercises: Machines for Strengthening   Cybex Lumbar Extension black tband trunk flex and ext 2 sets 10      Lumbar Exercises: Standing   Other Standing Lumbar Exercises BIL hip ext and abd 10 x      Lumbar Exercises: Supine   Other Supine Lumbar Exercises core stab 20 min       Iontophoresis   Type of Iontophoresis Dexamethasone    Location Left SI    Dose 1.1 cc    Time 4 hour leave on patch      Manual Therapy   Manual Therapy Passive ROM    Passive ROM LE and trunk                       PT Short Term Goals - 05/13/21 0935       PT SHORT TERM GOAL #1   Title I with basic HEP    Status Achieved               PT Long Term Goals - 08/19/21 1009       PT LONG TERM GOAL #1   Title I with final HEP.    Baseline started at gym    Status Partially Met                   Plan -  08/19/21 1010     Clinical Impression Statement pt arrived with increased pain and decreased amb without limp, stated started at gym and it went well but sore. very busy with planning a conference. progressing with goals slowly and working towards independant gym    PT Treatment/Interventions ADLs/Self Care Home Management;Iontophoresis 37m/ml Dexamethasone;Gait training;Stair training;Functional mobility training;Moist Heat;Traction;Therapeutic activities;Therapeutic exercise;Balance training;Neuromuscular re-education;Manual techniques;Dry needling;Passive range of motion;Patient/family education    PT Next Visit Plan assess and progress             Patient will benefit from skilled therapeutic intervention in order to improve the following deficits and impairments:  Abnormal gait, Decreased range of motion, Difficulty walking, Pain, Decreased activity tolerance, Decreased balance, Decreased mobility, Decreased strength, Impaired sensation, Postural dysfunction, Improper body mechanics, Impaired flexibility  Visit Diagnosis: Acute bilateral low back pain without sciatica  Muscle weakness (generalized)  Difficulty in walking, not elsewhere classified     Problem List Patient Active Problem List   Diagnosis Date Noted   Decreased GFR 05/10/2021   Personal history of colonic polyps 04/07/2021   Thyroid nodule 04/07/2021   GERD  (gastroesophageal reflux disease) 04/07/2021   Chronic bilateral low back pain with right-sided sciatica 04/07/2021   Scoliosis 04/07/2021   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   Osteoarthritis, 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   Hypertension 04/07/2021   High cholesterol 04/07/2021   History of anemia 04/07/2021   Body mass index (BMI) of 33.0 to 33.9 in adult 04/07/2021   Vitamin D deficiency 04/07/2021   OA (osteoarthritis of spine) 01/16/2017   S/P lumbar laminectomy 11/02/2016   Abnormal CT scan, chest 04/01/2013   Upper airway cough syndrome 01/05/2013    Saachi Zale,ANGIE, PTA 08/19/2021, 10:16 AM  [place] [address]Phone: [phone]  Fax:  [phone] Name: Margaret WERNETTEMRN: [id number] of Birth: [birth year]

## 2021-08-23 ENCOUNTER — Ambulatory Visit: Payer: No Typology Code available for payment source | Admitting: Physical Therapy

## 2021-08-23 DIAGNOSIS — M545 Low back pain, unspecified: Secondary | ICD-10-CM

## 2021-08-23 DIAGNOSIS — M6281 Muscle weakness (generalized): Secondary | ICD-10-CM

## 2021-08-23 DIAGNOSIS — R262 Difficulty in walking, not elsewhere classified: Secondary | ICD-10-CM

## 2021-08-23 NOTE — Therapy (Signed)
[place]. [place], [place], [address] Phone: [phone]   Fax:  [phone]  Physical Therapy Treatment Progress Note Reporting Period 07/19/21 to 08/23/21  See note below for Objective Data and Assessment of Progress/Goals.      Patient Details  Name: Margaret Gordon MRN: [id number] Date of Birth: 18-Oct-1940 Referring Provider (PT): Sherley Bounds   Encounter Date: 08/23/2021   PT End of Session - 08/23/21 1011     Visit Number 30    Date for PT Re-Evaluation 08/29/21    PT Start Time 0930    PT Stop Time 64    PT Time Calculation (min) 50 min             Past Medical History:  Diagnosis Date   Anemia    hx   Arthritis    Asthma    GERD (gastroesophageal reflux disease)    History of chicken pox    History of glaucoma    History of hiatal hernia    Hyperlipidemia    Hypertension    Migraines    Pneumonia    hx   Scoliosis     Past Surgical History:  Procedure Laterality Date   LUMBAR LAMINECTOMY/DECOMPRESSION MICRODISCECTOMY Left 11/02/2016   Procedure: Left Lumbar Three-Four, Lumbar Four-Five Laminectomy and Foraminotomy;  Surgeon: Eustace Moore, MD;  Location: [facility];  Service: Neurosurgery;  Laterality: Left;   TONSILLECTOMY     VESICOVAGINAL FISTULA CLOSURE W/ TAH      There were no vitals filed for this visit.   Subjective Assessment - 08/23/21 0933     Subjective rough getting ready for this conference that starts tomorrow    Currently in Pain? Yes    Pain Score 7     Pain Location Back                               [facility] Adult PT Treatment/Exercise - 08/23/21 0001       Lumbar Exercises: Aerobic   Nustep L 5 6 min LE only      Lumbar Exercises: Standing   Other Standing Lumbar Exercises BIL hip ext and abd 10 x      Lumbar Exercises: Seated   Other Seated Lumbar Exercises seated on sit fit 4 way pelvic moblity 15 x   clams and hip abd on sit fit  blue tband 15 x each. then tband row and shld ext 10 each, trunk rottaion 10 each     Iontophoresis   Type of Iontophoresis Dexamethasone    Location Left SI    Dose 1.1 cc    Time 4 hour leave on patch      Manual Therapy   Manual Therapy Passive ROM    Passive ROM LE and trunk                       PT Short Term Goals - 05/13/21 0935       PT SHORT TERM GOAL #1   Title I with basic HEP    Status Achieved               PT Long Term Goals - 08/23/21 1012       PT LONG TERM GOAL #1   Title I with final HEP.    Status Partially Met      PT LONG TERM GOAL #2  Baseline not captured    Status Deferred      PT LONG TERM GOAL #3   Title Patient will demosntrate lumbar ROM of at least 80% of full in all planes.    Status Achieved      PT LONG TERM GOAL #4   Status Partially Met                   Plan - 08/23/21 1013     Clinical Impression Statement pt arrived more stiff and sore with stress and business of preparing for conference- respoding well to ionto and felt better after todays session. taylored ex for increased pain and tightness    PT Treatment/Interventions ADLs/Self Care Home Management;Iontophoresis 54m/ml Dexamethasone;Gait training;Stair training;Functional mobility training;Moist Heat;Traction;Therapeutic activities;Therapeutic exercise;Balance training;Neuromuscular re-education;Manual techniques;Dry needling;Passive range of motion;Patient/family education    PT Next Visit Plan assess and progress             Patient will benefit from skilled therapeutic intervention in order to improve the following deficits and impairments:  Abnormal gait, Decreased range of motion, Difficulty walking, Pain, Decreased activity tolerance, Decreased balance, Decreased mobility, Decreased strength, Impaired sensation, Postural dysfunction, Improper body mechanics, Impaired flexibility  Visit Diagnosis: Acute bilateral low back pain without  sciatica  Muscle weakness (generalized)  Difficulty in walking, not elsewhere classified     Problem List Patient Active Problem List   Diagnosis Date Noted   Decreased GFR 05/10/2021   Personal history of colonic polyps 04/07/2021   Thyroid nodule 04/07/2021   GERD (gastroesophageal reflux disease) 04/07/2021   Chronic bilateral low back pain with right-sided sciatica 04/07/2021   Scoliosis 04/07/2021   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   Osteoarthritis, 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   Hypertension 04/07/2021   High cholesterol 04/07/2021   History of anemia 04/07/2021   Body mass index (BMI) of 33.0 to 33.9 in adult 04/07/2021   Vitamin D deficiency 04/07/2021   OA (osteoarthritis of spine) 01/16/2017   S/P lumbar laminectomy 11/02/2016   Abnormal CT scan, chest 04/01/2013   Upper airway cough syndrome 01/05/2013    SEthel RanaDPT 08/23/21 2:13 PM   Bettie Swavely,ANGIE, PTA 08/23/2021, 10:15 AM  [place] [address]Phone: [phone]  Fax:  [phone] Name: Margaret NAULTMRN: [id number] of Birth: 5Apr 20, 1942

## 2021-08-30 ENCOUNTER — Ambulatory Visit: Payer: No Typology Code available for payment source | Admitting: Physical Therapy

## 2021-08-30 DIAGNOSIS — M6281 Muscle weakness (generalized): Secondary | ICD-10-CM

## 2021-08-30 DIAGNOSIS — M545 Low back pain, unspecified: Secondary | ICD-10-CM

## 2021-08-30 DIAGNOSIS — R262 Difficulty in walking, not elsewhere classified: Secondary | ICD-10-CM

## 2021-08-30 NOTE — Therapy (Signed)
[facility] [address]. [place], [place], [address] Phone: [phone]   Fax:  [phone]  Physical Therapy Treatment  Patient Details  Name: Margaret Gordon MRN: [id number] Date of Birth: 04/02/1940 Referring Provider (PT): Marikay Alar   Encounter Date: 08/30/2021   PT End of Session - 08/30/21 1022     Visit Number 31    Date for PT Re-Evaluation 09/30/21    PT Start Time 0930    PT Stop Time 1015    PT Time Calculation (min) 45 min             Past Medical History:  Diagnosis Date   Anemia    hx   Arthritis    Asthma    GERD (gastroesophageal reflux disease)    History of chicken pox    History of glaucoma    History of hiatal hernia    Hyperlipidemia    Hypertension    Migraines    Pneumonia    hx   Scoliosis     Past Surgical History:  Procedure Laterality Date   LUMBAR LAMINECTOMY/DECOMPRESSION MICRODISCECTOMY Left 11/02/2016   Procedure: Left Lumbar Three-Four, Lumbar Four-Five Laminectomy and Foraminotomy;  Surgeon: Tia Alert, MD;  Location: [facility];  Service: Neurosurgery;  Laterality: Left;   TONSILLECTOMY     VESICOVAGINAL FISTULA CLOSURE W/ TAH      There were no vitals filed for this visit.   Subjective Assessment - 08/30/21 0938     Subjective conference last was and wore me out    Currently in Pain? Yes    Pain Score 7                                [facility] Adult PT Treatment/Exercise - 08/30/21 0001       Lumbar Exercises: Seated   Other Seated Lumbar Exercises trunk flex an dext 15 x each      Lumbar Exercises: Supine   Other Supine Lumbar Exercises core stab 20 min      Manual Therapy   Manual Therapy Passive ROM    Passive ROM LE and trunk                       PT Short Term Goals - 05/13/21 0935       PT SHORT TERM GOAL #1   Title I with basic HEP    Status Achieved               PT Long Term Goals - 08/30/21 4034        PT LONG TERM GOAL #1   Title I with final HEP.    Status Partially Met      PT LONG TERM GOAL #3   Title Patient will demosntrate lumbar ROM of at least 80% of full in all planes.    Status Achieved      PT LONG TERM GOAL #4   Status Partially Met                   Plan - 08/30/21 0939     Clinical Impression Statement pt arrived bakc after long busy conferene and tired and sore,no changes with goals. continues withhip pain and LE tightness. limited gym transition d/t conference- more time now to get going    PT Next Visit Plan Renewal Done 2 x this week and then  1 x a week for 3 weeks             Patient will benefit from skilled therapeutic intervention in order to improve the following deficits and impairments:     Visit Diagnosis: Acute bilateral low back pain without sciatica  Muscle weakness (generalized)  Difficulty in walking, not elsewhere classified     Problem List Patient Active Problem List   Diagnosis Date Noted   Decreased GFR 05/10/2021   Personal history of colonic polyps 04/07/2021   Thyroid nodule 04/07/2021   GERD (gastroesophageal reflux disease) 04/07/2021   Chronic bilateral low back pain with right-sided sciatica 04/07/2021   Scoliosis 04/07/2021   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   Osteoarthritis, 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   Hypertension 04/07/2021   High cholesterol 04/07/2021   History of anemia 04/07/2021   Body mass index (BMI) of 33.0 to 33.9 in adult 04/07/2021   Vitamin D deficiency 04/07/2021   OA (osteoarthritis of spine) 01/16/2017   S/P lumbar laminectomy 11/02/2016   Abnormal CT scan, chest 04/01/2013   Upper airway cough syndrome 01/05/2013  Oley Balm DPT 08/30/21 1:21 PM   Kavya Haag,ANGIE, PTA 08/30/2021, 10:38 AM  [facility] [address]. [place], [place], [address] Phone: [phone]   Fax:  [phone]  Name: SARAH-JANE PLUIM MRN:  [id number] Date of Birth: Feb 26, 1941

## 2021-09-02 ENCOUNTER — Encounter: Payer: Self-pay | Admitting: Physical Therapy

## 2021-09-02 ENCOUNTER — Ambulatory Visit: Payer: No Typology Code available for payment source | Admitting: Physical Therapy

## 2021-09-02 DIAGNOSIS — M545 Low back pain, unspecified: Secondary | ICD-10-CM

## 2021-09-02 DIAGNOSIS — M6281 Muscle weakness (generalized): Secondary | ICD-10-CM

## 2021-09-02 DIAGNOSIS — R2681 Unsteadiness on feet: Secondary | ICD-10-CM

## 2021-09-02 DIAGNOSIS — R262 Difficulty in walking, not elsewhere classified: Secondary | ICD-10-CM

## 2021-09-02 DIAGNOSIS — R42 Dizziness and giddiness: Secondary | ICD-10-CM

## 2021-09-02 NOTE — Therapy (Signed)
[facility] [place] [place] [address]. [place], [place], [address] Phone: [phone]   Fax:  [phone]  Physical Therapy Treatment  Patient Details  Name: Margaret Gordon MRN: [id number] Date of Birth: 07-10-1940 Referring Provider (PT): Marikay Alar   Encounter Date: 09/02/2021   PT End of Session - 09/02/21 1006     Visit Number 32    Date for PT Re-Evaluation 09/30/21    PT Start Time 0931    PT Stop Time 1012    PT Time Calculation (min) 41 min    Activity Tolerance Patient tolerated treatment well    Behavior During Therapy [facility] for tasks assessed/performed             Past Medical History:  Diagnosis Date   Anemia    hx   Arthritis    Asthma    GERD (gastroesophageal reflux disease)    History of chicken pox    History of glaucoma    History of hiatal hernia    Hyperlipidemia    Hypertension    Migraines    Pneumonia    hx   Scoliosis     Past Surgical History:  Procedure Laterality Date   LUMBAR LAMINECTOMY/DECOMPRESSION MICRODISCECTOMY Left 11/02/2016   Procedure: Left Lumbar Three-Four, Lumbar Four-Five Laminectomy and Foraminotomy;  Surgeon: Tia Alert, MD;  Location: [facility] OR;  Service: Neurosurgery;  Laterality: Left;   TONSILLECTOMY     VESICOVAGINAL FISTULA CLOSURE W/ TAH      There were no vitals filed for this visit.   Subjective Assessment - 09/02/21 0932     Subjective Patient reports just her normal aches and pains. She wants to continue to improve her L sided strength and control the pain.    Currently in Pain? Yes    Pain Score 7     Pain Location Back    Pain Onset More than a month ago    Pain Frequency Constant                               [facility] Adult PT Treatment/Exercise - 09/02/21 0001       Lumbar Exercises: Stretches   Passive Hamstring Stretch Right;Left;1 rep;30 seconds    ITB Stretch Right;Left;1 rep;30 seconds    Piriformis Stretch Right;Left;1  rep;30 seconds      Lumbar Exercises: Aerobic   Recumbent Bike L3.5 x 6 minutes      Lumbar Exercises: Machines for Strengthening   Other Lumbar Machine Exercise rows, lats (15#), ext, 10 # each, 2 x 10 reps      Lumbar Exercises: Seated   Other Seated Lumbar Exercises trunk ext 2 x 10, black Tband      Lumbar Exercises: Supine   Bridge Compliant;10 reps;2 seconds    Bridge Limitations Repeat with hip IR x 10, over physioball.    Bridge with clamshell Non-compliant;10 reps;2 seconds    Other Supine Lumbar Exercises Knees to chest over ball straight, then into diagonals each way, 10 each.                       PT Short Term Goals - 05/13/21 0935       PT SHORT TERM GOAL #1   Title I with basic HEP    Status Achieved               PT Long Term Goals -  09/02/21 1007       PT LONG TERM GOAL #1   Title I with final HEP.    Time 4    Status On-going      PT LONG TERM GOAL #3   Title Patient will demosntrate lumbar ROM of at least 80% of full in all planes.    Status Achieved      PT LONG TERM GOAL #4   Time 4    Status On-going                   Plan - 09/02/21 0937     Clinical Impression Statement Patient reports that she has her normal aches and pains, back pain always higher in the morning. Progressed with strengthening and stretch for low back and LE, planning to transition to a gym.    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 She is status post L3-4 L4-5 decompressive hemilaminectomy on the left.    Examination-Activity Limitations Reach Overhead;Bend;Locomotion Level;Transfers;Hygiene/Grooming;Dressing;Squat;Sleep    Examination-Participation Restrictions Cleaning;Meal Prep;Interpersonal Relationship;Laundry    Stability/Clinical Decision Making Stable/Uncomplicated    Clinical Decision Making Low    Rehab Potential Good    PT Frequency 2x / week    PT Duration 4 weeks    PT  Treatment/Interventions ADLs/Self Care Home Management;Iontophoresis 4mg /ml Dexamethasone;Gait training;Stair training;Functional mobility training;Moist Heat;Traction;Therapeutic activities;Therapeutic exercise;Balance training;Neuromuscular re-education;Manual techniques;Dry needling;Passive range of motion;Patient/family education    PT Next Visit Plan Renewal Done 2 x this week and then 1 x a week for 3 weeks    PT Home Exercise Plan    Consulted and Agree with Plan of Care Patient             Patient will benefit from skilled therapeutic intervention in order to improve the following deficits and impairments:  Abnormal gait, Decreased range of motion, Difficulty walking, Pain, Decreased activity tolerance, Decreased balance, Decreased mobility, Decreased strength, Impaired sensation, Postural dysfunction, Improper body mechanics, Impaired flexibility  Visit Diagnosis: Acute bilateral low back pain without sciatica  Muscle weakness (generalized)  Difficulty in walking, not elsewhere classified  Unsteadiness on feet  Dizziness and giddiness     Problem List Patient Active Problem List   Diagnosis Date Noted   Decreased GFR 05/10/2021   Personal history of colonic polyps 04/07/2021   Thyroid nodule 04/07/2021   GERD (gastroesophageal reflux disease) 04/07/2021   Chronic bilateral low back pain with right-sided sciatica 04/07/2021   Scoliosis 04/07/2021   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   Osteoarthritis, 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   Hypertension 04/07/2021   High cholesterol 04/07/2021   History of anemia 04/07/2021   Body mass index (BMI) of 33.0 to 33.9 in adult 04/07/2021   Vitamin D deficiency 04/07/2021   OA (osteoarthritis of spine) 01/16/2017   S/P lumbar laminectomy 11/02/2016   Abnormal CT scan, chest 04/01/2013   Upper airway cough syndrome 01/05/2013    13/04/2012, DPT 09/02/2021, 10:59 AM  [facility] [place]  [facility] [address]. [place], [place], [place] Phone: [phone]   Fax:  [phone]  Name: Margaret Gordon MRN: Roseanne Reno Date of Birth: Sep 25, 1940

## 2021-09-13 ENCOUNTER — Ambulatory Visit: Payer: No Typology Code available for payment source | Admitting: Physical Therapy

## 2021-09-20 ENCOUNTER — Ambulatory Visit: Payer: No Typology Code available for payment source | Attending: Neurological Surgery | Admitting: Physical Therapy

## 2021-09-20 DIAGNOSIS — M545 Low back pain, unspecified: Secondary | ICD-10-CM | POA: Insufficient documentation

## 2021-09-20 DIAGNOSIS — R262 Difficulty in walking, not elsewhere classified: Secondary | ICD-10-CM | POA: Insufficient documentation

## 2021-09-20 DIAGNOSIS — M6281 Muscle weakness (generalized): Secondary | ICD-10-CM | POA: Insufficient documentation

## 2021-09-20 NOTE — Therapy (Signed)
[place]. [place], [place], [address] Phone: [phone]   Fax:  [phone]  Physical Therapy Treatment  Patient Details  Name: Margaret Gordon MRN: [id number] Date of Birth: October 13, 1940 Referring Provider (PT): Sherley Bounds   Encounter Date: 09/20/2021   PT End of Session - 09/20/21 1003     Visit Number 58    Date for PT Re-Evaluation 09/30/21    PT Start Time 0926    PT Stop Time 1015    PT Time Calculation (min) 49 min             Past Medical History:  Diagnosis Date   Anemia    hx   Arthritis    Asthma    GERD (gastroesophageal reflux disease)    History of chicken pox    History of glaucoma    History of hiatal hernia    Hyperlipidemia    Hypertension    Migraines    Pneumonia    hx   Scoliosis     Past Surgical History:  Procedure Laterality Date   LUMBAR LAMINECTOMY/DECOMPRESSION MICRODISCECTOMY Left 11/02/2016   Procedure: Left Lumbar Three-Four, Lumbar Four-Five Laminectomy and Foraminotomy;  Surgeon: Eustace Moore, MD;  Location: [facility];  Service: Neurosurgery;  Laterality: Left;   TONSILLECTOMY     VESICOVAGINAL FISTULA CLOSURE W/ TAH      There were no vitals filed for this visit.   Subjective Assessment - 09/20/21 0926     Subjective pain has been bad, I need relief. I am going to call MD for appt. went OOT a few days and place was so big I could not walk- had to be in w/c- that is not me    Currently in Pain? Yes    Pain Score 7     Pain Location Back                               [facility] Adult PT Treatment/Exercise - 09/20/21 0001       Lumbar Exercises: Aerobic   Nustep L 5 6 min      Modalities   Modalities Electrical Stimulation;Moist Heat      Moist Heat Therapy   Number Minutes Moist Heat 15 Minutes    Moist Heat Location Lumbar Spine      Electrical Stimulation   Electrical Stimulation Location left LB    Electrical Stimulation  Action IFC    Electrical Stimulation Parameters supine    Electrical Stimulation Goals Pain      Manual Therapy   Manual Therapy Passive ROM;Soft tissue mobilization    Soft tissue mobilization with and without vibration Left LB, buttock and leg    Passive ROM LE and trunk              Trigger Point Dry Needling - 09/20/21 0001     Consent Given? Yes    Muscles Treated Back/Hip Gluteus maximus    Gluteus Maximus Response Twitch response elicited;Palpable increased muscle length                     PT Short Term Goals - 05/13/21 0935       PT SHORT TERM GOAL #1   Title I with basic HEP    Status Achieved               PT Long Term Goals - 09/20/21 1000  PT LONG TERM GOAL #1   Title I with final HEP.    Status Partially Met      PT LONG TERM GOAL #3   Title Patient will demosntrate lumbar ROM of at least 80% of full in all planes.    Status Achieved      PT LONG TERM GOAL #4   Title Patient will report back pain of <3/10 after performing her normal daily activities Independently.    Status Partially Met                   Plan - 09/20/21 1001     Clinical Impression Statement pt arrives with increased pain and frustration, wants to be better and do mor ebu tthen pain increases. went OOT and had to use w/c as she could not walk far. discussed with pt scheduling MD f/u and possible need for injections. explained nerves and compression. DN,STW with and without vibration , stetching and estim to calm pain.    PT Treatment/Interventions ADLs/Self Care Home Management;Iontophoresis 43m/ml Dexamethasone;Gait training;Stair training;Functional mobility training;Moist Heat;Traction;Therapeutic activities;Therapeutic exercise;Balance training;Neuromuscular re-education;Manual techniques;Dry needling;Passive range of motion;Patient/family education    PT Next Visit Plan assess how today tx felt and if git MD appt             Patient will  benefit from skilled therapeutic intervention in order to improve the following deficits and impairments:     Visit Diagnosis: Acute bilateral low back pain without sciatica  Muscle weakness (generalized)  Difficulty in walking, not elsewhere classified     Problem List Patient Active Problem List   Diagnosis Date Noted   Decreased GFR 05/10/2021   Personal history of colonic polyps 04/07/2021   Thyroid nodule 04/07/2021   GERD (gastroesophageal reflux disease) 04/07/2021   Chronic bilateral low back pain with right-sided sciatica 04/07/2021   Scoliosis 04/07/2021   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   Osteoarthritis, 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   Hypertension 04/07/2021   High cholesterol 04/07/2021   History of anemia 04/07/2021   Body mass index (BMI) of 33.0 to 33.9 in adult 04/07/2021   Vitamin D deficiency 04/07/2021   OA (osteoarthritis of spine) 01/16/2017   S/P lumbar laminectomy 11/02/2016   Abnormal CT scan, chest 04/01/2013   Upper airway cough syndrome 01/05/2013    Windell Musson,ANGIE, PTA 09/20/2021, 10:05 AM  [place] [address]Phone: [phone]  Fax:  [phone] Name: JKHADEEJA ELDENMRN: [id number] of Birth: [birth year]

## 2021-09-23 DIAGNOSIS — M545 Low back pain, unspecified: Secondary | ICD-10-CM | POA: Diagnosis not present

## 2021-09-23 DIAGNOSIS — M25552 Pain in left hip: Secondary | ICD-10-CM | POA: Diagnosis not present

## 2021-09-27 ENCOUNTER — Ambulatory Visit: Payer: No Typology Code available for payment source | Admitting: Physical Therapy

## 2021-09-27 DIAGNOSIS — M545 Low back pain, unspecified: Secondary | ICD-10-CM | POA: Diagnosis not present

## 2021-09-27 DIAGNOSIS — M6281 Muscle weakness (generalized): Secondary | ICD-10-CM

## 2021-09-27 DIAGNOSIS — R262 Difficulty in walking, not elsewhere classified: Secondary | ICD-10-CM

## 2021-09-27 NOTE — Therapy (Signed)
[facility] [place], [place], [address] Phone: [phone]   Fax:  [phone]  Physical Therapy Treatment  Patient Details  Name: Margaret Gordon MRN: [id number] Date of Birth: [birth year] Referring Provider (PT): Sherley Bounds   Encounter Date: 09/27/2021   PT End of Session - 09/27/21 0928     Visit Number [id number]    Date for PT Re-Evaluation 09/30/21    PT Start Time 0930    PT Stop Time 1015    PT Time Calculation (min) 45 min             Past Medical History:  Diagnosis Date   Anemia    hx   Arthritis    Asthma    GERD (gastroesophageal reflux disease)    History of chicken pox    History of glaucoma    History of hiatal hernia    Hyperlipidemia    Hypertension    Migraines    Pneumonia    hx   Scoliosis     Past Surgical History:  Procedure Laterality Date   LUMBAR LAMINECTOMY/DECOMPRESSION MICRODISCECTOMY Left 11/02/2016   Procedure: Left Lumbar Three-Four, Lumbar Four-Five Laminectomy and Foraminotomy;  Surgeon: Eustace Moore, MD;  Location: [facility];  Service: Neurosurgery;  Laterality: Left;   TONSILLECTOMY     VESICOVAGINAL FISTULA CLOSURE W/ TAH      There were no vitals filed for this visit.   Subjective Assessment - 09/27/21 0928     Subjective saw ortho MD , hip looks good. 3 spots on back - MRI 8/3 and then with do injections. feeling better. BIL hip injections help adn will start prednisone this week for church trip    Pain Score 4     Pain Location Back    Pain Orientation Left                               [facility] Adult PT Treatment/Exercise - 09/27/21 0001       Lumbar Exercises: Aerobic   Nustep L 5 8 min      Lumbar Exercises: Machines for Strengthening   Cybex Lumbar Extension black tband trunk flex and ext 2 sets 10    Cybex Knee Extension 5# 2x10    Cybex Knee Flexion 20# 2x10    Leg Press 20# feet 3 way 10 x each   20# calf rases 2 sets 10      Lumbar Exercises: Supine   Ab Set 15 reps;3 seconds   with ball   Bridge Non-compliant;15 reps;3 seconds    Other Supine Lumbar Exercises Knees to chest over ball straight, then into diagonals each way, 15each.                       PT Short Term Goals - 05/13/21 0935       PT SHORT TERM GOAL #1   Title I with basic HEP    Status Achieved               PT Long Term Goals - 09/27/21 0258       PT LONG TERM GOAL #1   Title I with final HEP.    Status Partially Met      PT LONG TERM GOAL #3   Title Patient will demosntrate lumbar ROM of at least 80% of full in all planes.    Baseline guarded  with pain    Status Partially Met      PT LONG TERM GOAL #4   Title Patient will report back pain of <3/10 after performing her normal daily activities Independently.    Status Partially Met                   Plan - 09/27/21 0959     Clinical Impression Statement pt arrives feeling better than last session, pt has seen MD and got injections and prednsion. pt to get MRI 8/3 ad MD follow up 8/7 and then will discuss back injections. pt was making good progress and looking to d/c but since conference several weeks ago set back, hopeful that injections help pain so we can resume strengthening and help pt to move with increased ease and get back into gym. lomited goal progress    PT Treatment/Interventions ADLs/Self Care Home Management;Iontophoresis 67m/ml Dexamethasone;Gait training;Stair training;Functional mobility training;Moist Heat;Traction;Therapeutic activities;Therapeutic exercise;Balance training;Neuromuscular re-education;Manual techniques;Dry needling;Passive range of motion;Patient/family education    PT Next Visit Plan HOLD and pt to call and get appt after recieving bakc inj so we can continue and focus on strengthening             Patient will benefit from skilled therapeutic intervention in order to improve the following deficits and  impairments:  Abnormal gait, Decreased range of motion, Difficulty walking, Pain, Decreased activity tolerance, Decreased balance, Decreased mobility, Decreased strength, Impaired sensation, Postural dysfunction, Improper body mechanics, Impaired flexibility  Visit Diagnosis: Acute bilateral low back pain without sciatica  Muscle weakness (generalized)  Difficulty in walking, not elsewhere classified     Problem List Patient Active Problem List   Diagnosis Date Noted   Decreased GFR 05/10/2021   Personal history of colonic polyps 04/07/2021   Thyroid nodule 04/07/2021   GERD (gastroesophageal reflux disease) 04/07/2021   Chronic bilateral low back pain with right-sided sciatica 04/07/2021   Scoliosis 04/07/2021   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   Osteoarthritis, 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   Hypertension 04/07/2021   High cholesterol 04/07/2021   History of anemia 04/07/2021   Body mass index (BMI) of 33.0 to 33.9 in adult 04/07/2021   Vitamin D deficiency 04/07/2021   OA (osteoarthritis of spine) 01/16/2017   S/P lumbar laminectomy 11/02/2016   Abnormal CT scan, chest 04/01/2013   Upper airway cough syndrome 01/05/2013    Margaret Gordon,Margaret Gordon, PTA 09/27/2021, 10:03 AM  [place] [address]Phone: [phone]  Fax:  [phone] Name: Margaret PASSEMRN: [id number] of Birth: [birth year]

## 2021-10-06 DIAGNOSIS — M545 Low back pain, unspecified: Secondary | ICD-10-CM | POA: Diagnosis not present

## 2021-10-10 ENCOUNTER — Ambulatory Visit (INDEPENDENT_AMBULATORY_CARE_PROVIDER_SITE_OTHER): Payer: No Typology Code available for payment source | Admitting: *Deleted

## 2021-10-10 DIAGNOSIS — Z0000 Encounter for general adult medical examination without abnormal findings: Secondary | ICD-10-CM | POA: Diagnosis not present

## 2021-10-10 NOTE — Patient Instructions (Signed)
Margaret Gordon , Thank you for taking time to come for your Medicare Wellness Visit. I appreciate your ongoing commitment to your health goals. Please review the following plan we discussed and let me know if I can assist you in the future.   Screening recommendations/referrals: Colonoscopy: no longer required Mammogram: up to date Bone Density: Education provided Recommended yearly ophthalmology/optometry visit for glaucoma screening and checkup Recommended yearly dental visit for hygiene and checkup  Vaccinations: Influenza vaccine: up to date Pneumococcal vaccine: up to date Tdap vaccine: up to date Shingles vaccine: Education provided    Advanced directives: Education provided   Preventive Care 65 Years and Older, Female Preventive care refers to lifestyle choices and visits with your health care provider that can promote health and wellness. What does preventive care include? A yearly physical exam. This is also called an annual well check. Dental exams once or twice a year. Routine eye exams. Ask your health care provider how often you should have your eyes checked. Personal lifestyle choices, including: Daily care of your teeth and gums. Regular physical activity. Eating a healthy diet. Avoiding tobacco and drug use. Limiting alcohol use. Practicing safe sex. Taking low-dose aspirin every day. Taking vitamin and mineral supplements as recommended by your health care provider. What happens during an annual well check? The services and screenings done by your health care provider during your annual well check will depend on your age, overall health, lifestyle risk factors, and family history of disease. Counseling  Your health care provider may ask you questions about your: Alcohol use. Tobacco use. Drug use. Emotional well-being. Home and relationship well-being. Sexual activity. Eating habits. History of falls. Memory and ability to understand (cognition). Work and  work Astronomer. Reproductive health. Screening  You may have the following tests or measurements: Height, weight, and BMI. Blood pressure. Lipid and cholesterol levels. These may be checked every 5 years, or more frequently if you are over 28 years old. Skin check. Lung cancer screening. You may have this screening every year starting at age 19 if you have a 30-pack-year history of smoking and currently smoke or have quit within the past 15 years. Fecal occult blood test (FOBT) of the stool. You may have this test every year starting at age 36. Flexible sigmoidoscopy or colonoscopy. You may have a sigmoidoscopy every 5 years or a colonoscopy every 10 years starting at age 10. Hepatitis C blood test. Hepatitis B blood test. Sexually transmitted disease (STD) testing. Diabetes screening. This is done by checking your blood sugar (glucose) after you have not eaten for a while (fasting). You may have this done every 1-3 years. Bone density scan. This is done to screen for osteoporosis. You may have this done starting at age 59. Mammogram. This may be done every 1-2 years. Talk to your health care provider about how often you should have regular mammograms. Talk with your health care provider about your test results, treatment options, and if necessary, the need for more tests. Vaccines  Your health care provider may recommend certain vaccines, such as: Influenza vaccine. This is recommended every year. Tetanus, diphtheria, and acellular pertussis (Tdap, Td) vaccine. You may need a Td booster every 10 years. Zoster vaccine. You may need this after age 76. Pneumococcal 13-valent conjugate (PCV13) vaccine. One dose is recommended after age 59. Pneumococcal polysaccharide (PPSV23) vaccine. One dose is recommended after age 8. Talk to your health care provider about which screenings and vaccines you need and how often you need  them. This information is not intended to replace advice given to you  by your health care provider. Make sure you discuss any questions you have with your health care provider. Document Released: 03/19/2015 Document Revised: 11/10/2015 Document Reviewed: 12/22/2014 Elsevier Interactive Patient Education  2017 [employer] Prevention in the Home Falls can cause injuries. They can happen to people of all ages. There are many things you can do to make your home safe and to help prevent falls. What can I do on the outside of my home? Regularly fix the edges of walkways and driveways and fix any cracks. Remove anything that might make you trip as you walk through a door, such as a raised step or threshold. Trim any bushes or trees on the path to your home. Use bright outdoor lighting. Clear any walking paths of anything that might make someone trip, such as rocks or tools. Regularly check to see if handrails are loose or broken. Make sure that both sides of any steps have handrails. Any raised decks and porches should have guardrails on the edges. Have any leaves, snow, or ice cleared regularly. Use sand or salt on walking paths during winter. Clean up any spills in your garage right away. This includes oil or grease spills. What can I do in the bathroom? Use night lights. Install grab bars by the toilet and in the tub and shower. Do not use towel bars as grab bars. Use non-skid mats or decals in the tub or shower. If you need to sit down in the shower, use a plastic, non-slip stool. Keep the floor dry. Clean up any water that spills on the floor as soon as it happens. Remove soap buildup in the tub or shower regularly. Attach bath mats securely with double-sided non-slip rug tape. Do not have throw rugs and other things on the floor that can make you trip. What can I do in the bedroom? Use night lights. Make sure that you have a light by your bed that is easy to reach. Do not use any sheets or blankets that are too big for your bed. They should not  hang down onto the floor. Have a firm chair that has side arms. You can use this for support while you get dressed. Do not have throw rugs and other things on the floor that can make you trip. What can I do in the kitchen? Clean up any spills right away. Avoid walking on wet floors. Keep items that you use a lot in easy-to-reach places. If you need to reach something above you, use a strong step stool that has a grab bar. Keep electrical cords out of the way. Do not use floor polish or wax that makes floors slippery. If you must use wax, use non-skid floor wax. Do not have throw rugs and other things on the floor that can make you trip. What can I do with my stairs? Do not leave any items on the stairs. Make sure that there are handrails on both sides of the stairs and use them. Fix handrails that are broken or loose. Make sure that handrails are as long as the stairways. Check any carpeting to make sure that it is firmly attached to the stairs. Fix any carpet that is loose or worn. Avoid having throw rugs at the top or bottom of the stairs. If you do have throw rugs, attach them to the floor with carpet tape. Make sure that you have a light switch at  the top of the stairs and the bottom of the stairs. If you do not have them, ask someone to add them for you. What else can I do to help prevent falls? Wear shoes that: Do not have high heels. Have rubber bottoms. Are comfortable and fit you well. Are closed at the toe. Do not wear sandals. If you use a stepladder: Make sure that it is fully opened. Do not climb a closed stepladder. Make sure that both sides of the stepladder are locked into place. Ask someone to hold it for you, if possible. Clearly mark and make sure that you can see: Any grab bars or handrails. First and last steps. Where the edge of each step is. Use tools that help you move around (mobility aids) if they are needed. These  include: Canes. Walkers. Scooters. Crutches. Turn on the lights when you go into a dark area. Replace any light bulbs as soon as they burn out. Set up your furniture so you have a clear path. Avoid moving your furniture around. If any of your floors are uneven, fix them. If there are any pets around you, be aware of where they are. Review your medicines with your doctor. Some medicines can make you feel dizzy. This can increase your chance of falling. Ask your doctor what other things that you can do to help prevent falls. This information is not intended to replace advice given to you by your health care provider. Make sure you discuss any questions you have with your health care provider. Document Released: 12/17/2008 Document Revised: 07/29/2015 Document Reviewed: 03/27/2014 Elsevier Interactive Patient Education  2017 [employer].

## 2021-10-10 NOTE — Progress Notes (Signed)
Subjective:   Margaret Gordon is a 81 y.o. female who presents for Medicare Annual (Subsequent) preventive examination.  I connected with  MILAYAH KRELL on 10/10/21 by a telephone enabled telemedicine application and verified that I am speaking with the correct person using two identifiers.   I discussed the limitations of evaluation and management by telemedicine. The patient expressed understanding and agreed to proceed.  Patient location: home  Provider location: Tele-health-home    Review of Systems     Cardiac Risk Factors include: advanced age (>52men, >19 women);hypertension;sedentary lifestyle;family history of premature cardiovascular disease;obesity (BMI >30kg/m2)     Objective:    Today's Vitals   There is no height or weight on file to calculate BMI.     10/10/2021   11:37 AM 05/04/2020    9:36 AM 11/03/2016    7:30 AM 10/30/2016    9:18 AM  Advanced Directives  Does Patient Have a Medical Advance Directive? No No No No  Would patient like information on creating a medical advance directive? No - Patient declined  No - Patient declined Yes (MAU/Ambulatory/Procedural Areas - Information given)    Current Medications (verified) Outpatient Encounter Medications as of 10/10/2021  Medication Sig   amLODipine (NORVASC) 5 MG tablet Take 5 mg by mouth daily.   APPLE CIDER VINEGAR PO Take 1 each by mouth every morning.   atorvastatin (LIPITOR) 10 MG tablet Take 10 mg by mouth daily.   FIBER ADULT GUMMIES PO Take 1 each by mouth daily.   furosemide (LASIX) 20 MG tablet Take 20 mg by mouth daily.   Multiple Vitamin (MULTIVITAMIN WITH MINERALS) TABS tablet Take 1 tablet by mouth at bedtime.   pantoprazole (PROTONIX) 40 MG tablet Take 1 tablet (40 mg total) by mouth daily.   No facility-administered encounter medications on file as of 10/10/2021.    Allergies (verified) Patient has no known allergies.   History: Past Medical History:  Diagnosis Date   Anemia    hx    Arthritis    Asthma    GERD (gastroesophageal reflux disease)    History of chicken pox    History of glaucoma    History of hiatal hernia    Hyperlipidemia    Hypertension    Migraines    Pneumonia    hx   Scoliosis    Past Surgical History:  Procedure Laterality Date   LUMBAR LAMINECTOMY/DECOMPRESSION MICRODISCECTOMY Left 11/02/2016   Procedure: Left Lumbar Three-Four, Lumbar Four-Five Laminectomy and Foraminotomy;  Surgeon: Tia Alert, MD;  Location: [facility] OR;  Service: Neurosurgery;  Laterality: Left;   TONSILLECTOMY     VESICOVAGINAL FISTULA CLOSURE W/ TAH     Family History  Problem Relation Age of Onset   Hyperlipidemia Mother    Hypertension Mother    Diabetes Mother    Cancer Mother 78   Breast cancer Mother    Hypertension Sister    Hyperlipidemia Sister    Heart disease Sister    Diabetes Sister    Kidney disease Sister    Arthritis Sister    Kidney disease Sister    Hypertension Sister    Hyperlipidemia Sister    Diabetes Sister    Diabetes Brother    Early death Brother    Arthritis Daughter    Social History   Socioeconomic History   Marital status: Married    Spouse name: Kerri Perches   Number of children: Not on file   Years of education: Not on file  Highest education level: Not on file  Occupational History   Occupation: Retired    Comment: worked for [employer]    Occupation: Building control surveyor, retired  Tobacco Use   Smoking status: Never    Passive exposure: Past   Smokeless tobacco: Former  Substance and Sexual Activity   Alcohol use: Yes    Alcohol/week: 1.0 standard drink of alcohol    Types: 1 Glasses of wine per week   Drug use: No   Sexual activity: Not on file  Other Topics Concern   Not on file  Social History Narrative   Not on file   Social Determinants of Health   Financial Resource Strain: Low Risk  (10/10/2021)   Overall Financial Resource Strain (CARDIA)    Difficulty of Paying Living Expenses: Not  hard at all  Food Insecurity: No Food Insecurity (10/10/2021)   Hunger Vital Sign    Worried About Running Out of Food in the Last Year: Never true    Ran Out of Food in the Last Year: Never true  Transportation Needs: No Transportation Needs (10/10/2021)   PRAPARE - Administrator, Civil Service (Medical): No    Lack of Transportation (Non-Medical): No  Physical Activity: Inactive (10/10/2021)   Exercise Vital Sign    Days of Exercise per Week: 0 days    Minutes of Exercise per Session: 0 min  Stress: No Stress Concern Present (10/10/2021)   [employer] of Occupational Health - Occupational Stress Questionnaire    Feeling of Stress : Not at all  Social Connections: Socially Integrated (10/10/2021)   Social Connection and Isolation Panel [NHANES]    Frequency of Communication with Friends and Family: More than three times a week    Frequency of Social Gatherings with Friends and Family: Three times a week    Attends Religious Services: More than 4 times per year    Active Member of Clubs or Organizations: Yes    Attends Engineer, structural: More than 4 times per year    Marital Status: Married    Tobacco Counseling Counseling given: Not Answered   Clinical Intake:  Pre-visit preparation completed: Yes  Pain : No/denies pain     Nutritional Risks: None Diabetes: No  How often do you need to have someone help you when you read instructions, pamphlets, or other written materials from your doctor or pharmacy?: 1 - Never  Diabetic?  no  Interpreter Needed?: No  Information entered by :: Remi Haggard LPN   Activities of Daily Living    10/10/2021   11:38 AM  In your present state of health, do you have any difficulty performing the following activities:  Hearing? 0  Vision? 0  Difficulty concentrating or making decisions? 0  Walking or climbing stairs? 1  Dressing or bathing? 0  Doing errands, shopping? 0  Preparing Food and eating ? N  Using the  Toilet? N  In the past six months, have you accidently leaked urine? N  Do you have problems with loss of bowel control? N  Managing your Medications? N  Managing your Finances? N  Housekeeping or managing your Housekeeping? N    Patient Care Team: Excell Seltzer, MD as PCP - General (Family Medicine)  Indicate any recent Medical Services you may have received from other than [facility] providers in the past year (date may be approximate).     Assessment:   This is a routine wellness examination for Maciel.  Hearing/Vision screen  Hearing Screening - Comments:: No trouble hearing Vision Screening - Comments:: Up to date Dr. [facility]ac[facility] Meeker  Dietary issues and exercise activities discussed: Current Exercise Habits: The patient does not participate in regular exercise at present   Goals Addressed             This Visit's Progress    Patient Stated       Be able to get strong to remove cane       Depression Screen    10/10/2021   11:41 AM  PHQ 2/9 Scores  PHQ - 2 Score 0    Fall Risk    10/10/2021   11:38 AM  Fall Risk   Falls in the past year? 0  Number falls in past yr: 0  Injury with Fall? 0  Follow up Falls evaluation completed;Education provided;Falls prevention discussed    FALL RISK PREVENTION PERTAINING TO THE HOME:  Any stairs in or around the home? No  If so, are there any without handrails? No  Home free of loose throw rugs in walkways, pet beds, electrical cords, etc? Yes  Adequate lighting in your home to reduce risk of falls? Yes   ASSISTIVE DEVICES UTILIZED TO PREVENT FALLS:  Life alert? No  Use of a cane, walker or w/c? Yes  Grab bars in the bathroom? Yes  Shower chair or bench in shower? No  Elevated toilet seat or a handicapped toilet? No   TIMED UP AND GO:  Was the test performed? No .    Cognitive Function:        10/10/2021   11:37 AM  6CIT Screen  What Year? 0 points  What month? 0 points  What time? 0 points  Count back from 20 0  points  Months in reverse 0 points  Repeat phrase 0 points  Total Score 0 points    Immunizations Immunization History  Administered Date(s) Administered   Influenza Split 12/26/2012, 12/19/2014, 12/25/2015, 11/27/2016, 11/20/2017, 12/25/2020   Influenza, High Dose Seasonal PF 11/19/2017, 11/29/2018   PFIZER(Purple Top)SARS-COV-2 Vaccination 03/28/2019, 04/08/2019, 12/04/2019, 07/17/2020   Pneumococcal Conjugate-13 09/19/2013   Pneumococcal Polysaccharide-23 10/29/2009   Td 10/29/2009   Tdap 10/08/2020    TDAP status: Up to date  Flu Vaccine status: Up to date  Pneumococcal vaccine status: Up to date  Covid-19 vaccine status: Information provided on how to obtain vaccines.   Qualifies for Shingles Vaccine? Yes   Zostavax completed No   Shingrix Completed?: No.    Education has been provided regarding the importance of this vaccine. Patient has been advised to call insurance company to determine out of pocket expense if they have not yet received this vaccine. Advised may also receive vaccine at local pharmacy or Health Dept. Verbalized acceptance and understanding.  Screening Tests Health Maintenance  Topic Date Due   Zoster Vaccines- Shingrix (1 of 2) Never done   COVID-19 Vaccine (5 - Booster for Pfizer series) 09/11/2020   INFLUENZA VACCINE  10/04/2021   TETANUS/TDAP  10/09/2030   Pneumonia Vaccine 73+ Years old  Completed   DEXA SCAN  Completed   Hepatitis C Screening  Completed   HPV VACCINES  Aged Out    Health Maintenance  Health Maintenance Due  Topic Date Due   Zoster Vaccines- Shingrix (1 of 2) Never done   COVID-19 Vaccine (5 - Booster for Pfizer series) 09/11/2020   INFLUENZA VACCINE  10/04/2021    Colorectal cancer screening: No longer required.   Mammogram status: Completed  . Repeat  every year  Bone Density status: Completed 2021. Results reflect: Bone density results: NORMAL. Repeat every 0 years.  Lung Cancer Screening: (Low Dose CT Chest  recommended if Age 65-80 years, 30 pack-year currently smoking OR have quit w/in 15years.) does not qualify.   Lung Cancer Screening Referral:   Additional Screening:  Hepatitis C Screening: does not qualify; Completed 2023  Vision Screening: Recommended annual ophthalmology exams for early detection of glaucoma and other disorders of the eye. Is the patient up to date with their annual eye exam?  Yes  Who is the provider or what is the name of the office in which the patient attends annual eye exams? [facility] If pt is not established with a provider, would they like to be referred to a provider to establish care? No .   Dental Screening: Recommended annual dental exams for proper oral hygiene  Community Resource Referral / Chronic Care Management: CRR required this visit?  No   CCM required this visit?  No      Plan:     I have personally reviewed and noted the following in the patient's chart:   Medical and social history Use of alcohol, tobacco or illicit drugs  Current medications and supplements including opioid prescriptions.  Functional ability and status Nutritional status Physical activity Advanced directives List of other physicians Hospitalizations, surgeries, and ER visits in previous 12 months Vitals Screenings to include cognitive, depression, and falls Referrals and appointments  In addition, I have reviewed and discussed with patient certain preventive protocols, quality metrics, and best practice recommendations. A written personalized care plan for preventive services as well as general preventive health recommendations were provided to patient.     Remi Haggard, LPN   09/05/2200   Nurse Notes:

## 2021-10-20 ENCOUNTER — Encounter: Payer: MEDICARE | Primary: Geriatric Medicine

## 2021-11-03 DIAGNOSIS — M5416 Radiculopathy, lumbar region: Secondary | ICD-10-CM | POA: Diagnosis not present

## 2021-11-03 DIAGNOSIS — Z6831 Body mass index (BMI) 31.0-31.9, adult: Secondary | ICD-10-CM | POA: Diagnosis not present

## 2021-11-10 ENCOUNTER — Encounter: Payer: MEDICARE | Primary: Geriatric Medicine

## 2021-11-17 DIAGNOSIS — M48061 Spinal stenosis, lumbar region without neurogenic claudication: Secondary | ICD-10-CM | POA: Diagnosis not present

## 2021-11-17 DIAGNOSIS — Z6831 Body mass index (BMI) 31.0-31.9, adult: Secondary | ICD-10-CM | POA: Diagnosis not present

## 2021-11-17 DIAGNOSIS — M961 Postlaminectomy syndrome, not elsewhere classified: Secondary | ICD-10-CM | POA: Diagnosis not present

## 2021-11-17 DIAGNOSIS — M5416 Radiculopathy, lumbar region: Secondary | ICD-10-CM | POA: Diagnosis not present

## 2021-11-29 ENCOUNTER — Inpatient Hospital Stay: Admit: 2021-11-29 | Payer: MEDICARE | Primary: Geriatric Medicine

## 2021-11-29 NOTE — Progress Notes (Addendum)
[facility]  a part of [facility]   [address], [place] [address]  Phone: [phone]    Fax: [phone]                                                                              PT/OT LYMPHEDEMA - EVALUATION/PLAN OF CARE NOTE (updated 3/23)    Date: 11/29/2021        Patient Name:  Allison Newman DOB:  September 29, 1940   Medical   Diagnosis:  Lymphedema, not elsewhere classified [I89.0] Treatment Diagnosis:  I89.0     Lymphedema, not elsewhere classified    Referral Source:  Deatra Ina, NP Provider #:  [id number]                Insurance: Payor: Mcarthur Rossetti MEDICARE / Plan: Salli Quarry PLUS HMO / Product Type: *No Product type* /      Patient DOB verified yes     Visit #   Current  / Total 1 10   Time   In / Out 7:35 am 8:30 am   Total Treatment Time 55   Total Timed Codes 0 - evaluation only   1:1 Treatment Time 0 - evaluation only      [facility] Totals Reminder:  bill using total billable   min of TIMED therapeutic procedures and modalities.   8-22 min = 1 unit; 23-37 min = 2 units; 38-52 min = 3 units;  53-67 min = 4 units; 68-82 min = 5 units     SUBJECTIVE  Pain Level (0-10 scale): Back pain, sciatic pain, and L knee pain due to arthritis - not rated on numeric scale  [x] constant [] intermittent [] improving [] worsening [] no change since onset    Any medication changes, allergies to medications, adverse drug reactions, diagnosis change, or new procedure performed?: [x]  No    []  Yes (see summary sheet for update)  Medications: Verified on Patient Summary List    Subjective functional status/changes:   Patient reports that swelling in the legs began approximately one year ago and she believes it may be due to being more sedentary than normal.   Start of Care: 11/29/2021  Onset Date: 11/04/2020  Current symptoms/Complaints: B LE swelling   Mechanism of Injury: Obesity, sedentary lifestyle   Work Hx: Retired   Living Situation: Patient lives with grandson in a tri level  house. Son lives nearby and is supportive. Son accompanied patient to the visit today and transports patient to medical appointments.  Mobility: Ambulates with rolling walker for short community and household distances; wears oxygen at home PRN; sleeps in a chair with legs elevated  Self Care: Modified independent for bathing and dressing. Tends to sponge bathe but will bathe in a walk in shower with shower chair on occasion.   Previous Treatment/Compliance: None  PMHx/Surgical Hx/Comorbidites:   Past Surgical History:   Procedure Laterality Date    COLONOSCOPY N/A 04/23/2018    COLONOSCOPY AND ESOPHAGOGASTRODUODENOSCOPY (EGD) performed by Marlise Eves., MD at [place] [place]    screw in foot left    [place]  knee replacement right    OTHER SURGICAL HISTORY      colonoscopy    TUBAL LIGATION       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    Chronic pain of left knee 08/13/2014    Chronic right hip pain 03/17/2013    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    Diabetes (HCC)     Elevated BUN 09/13/2011    Finger lesion 03/02/2016    Gallbladder polyp 04/08/2013    Gastrointestinal disorder     GERD    GERD (gastroesophageal reflux disease)     Hypercholesterolemia     Hypertension     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    Microalbuminuria 08/29/2013    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, adult (HCC) 08/28/2011    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    On aspirin at home 05/03/2015    Other unknown and unspecified cause of morbidity or mortality     hx of bronchitis    Rash, skin 09/25/2012    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) 03/02/2016    Slow  transit constipation 08/13/2014    Umbilical hernia 05/08/2013     Prior Hospitalization:  no recent hospitalization  Barriers: [x] pain [] Financial [] time [x] transportation [x] Other: no insurance coverage of compression products  Substance use: [] Alcohol [] Tobacco [x] other: not assessed  Pt Goals: reduce swelling in the legs  Motivation: good   Cognition: A & O x 3      Lymphedema Life Impact Scale: scores a 29 with 43% impairment    OBJECTIVE    Current or Previous Compression Garment(s):  None                       Compression Pump:  None    Skin Integrity and Tissue Assessment  Dermal Status: Intact with scar s/p R TKR, feet are dry and flaky  Texture/Consistency:  Brawny lower legs and feet, boggy upper legs and trunk  Sensation:  grossly intact  Pigmentation/Color Change: Hyperpigmented  Circulatory: Vascular studies ruled out DVT in past   Nails: Normal and Discolored  Stemmers Sign: negative    Height: 5'2"  Weight: not assessed due to time constraints    MMT: General debility noted - patient requires assistance for management of legs on and off mat table.    Volumetric Measurements:   Right:  15,956.30 mL Left:  16,492.50 mL   % Difference: 3.36% Dominance: R hand dominant     Girth measurements of the trunk: to be assessed next visit    55 min [x] Eval - untimed    Patient seen for evaluation only until treatment is authorized by insurance.                   Patient Education: [x]  Review HEP    [x]   Patient Education billed concurrently with other procedures   [x]  MLD Patient Education Continued education in self MLD technique with bathing and skin care  []  Progressed/Changed HEP based on:   [x]  positioning   []  Kinesiotape   [x]  Skin care   []  wound care   [  x] other: initiated discussion on compression products  Patient / caregiver re-demonstrated bandaging. []  Yes  []  No  Compression bandaging/garment precautions reviewed: []  Yes  []  No    Pain Level at end of session (0-10 scale): Back pain, sciatic pain, and  L knee pain due to arthritis - not rated on numeric scale      Plan of Care / Statement of Necessity for Lymphedema Therapy Services     Assessment / key information:  Patient is a 81 year old female with secondary B LE lymphedema complicated by multiple co-morbidities, chronic pain, and sedentary lifestyle. Multi-layer compression bandaging may not be the best option due to increased fall risk, relying on son for transportation, and time commitment but patient voiced interest in pursuing compression products for better management of swelling. Patient will benefit from complete decongestive therapy (CDT) including  manual lymphatic drainage (MLD) technique, short-stretch textile bandages/compression system to decongest limb, and kinesiotaping as appropriate.  Patient will receive instruction in proper skin care to recognize signs/symptoms of and prevent infection,  therapeutic exercise, and self-MLD for independent home program and restorative lymphatic performance.      This care is medically necessary due to the infection risk with lymphedema and to improve functional activities.  CDT is necessary to resolve swelling to allow patient to return to wearing normal clothes/footwear and prevent worsening of symptoms, such  as venous stasis ulcerations, infections, or hospitalizations.  Patient will be independent with home program strategies to allow improved ADL ability and mobility and to allow patient to return to greatest functional independence.    Evaluation Complexity:  History:  HIGH Complexity :3+ comorbidities / personal factors will impact the outcome/ POC ; Examination:  MEDIUM Complexity : 3 Standardized tests and measures addressin body structure, function, activity limitation and / or participation in recreation  ;Presentation:  MEDIUM Complexity : Evolving with changing characteristics  ;Clinical Decision Making:  Other outcome measures LLIS  MEDIUM Overall Complexity Rating: MEDIUM  Problem List: pain  affecting function, decrease ROM, decrease strength, edema affection function, impaired gait/balance, decrease ADL/functional abilities, decrease activity tolerance, decrease flexibility/joint mobility, and decrease transfer abilities    Treatment Plan may include any combination of the following: Therapeutic Exercise, 97140 Manual Therapy, 97535 Self Care/Home Management, 97016 Vasopneumatic Device  (Vasopnuematic compression justification:  Per bilateral girth measures taken and listed above the edema is considered significant and having an impact on the patient's balance, gait, transfers, and self care), and 97760 Orthotic Management and Training  Patient / Family readiness to learn indicated by: asking questions, trying to perform skills, and interest  Persons(s) to be included in education: patient (P)  Barriers to Learning/Limitations: other to be determined  Patient Self Reported Health Status: fair  Rehabilitation Potential: fair    Short Term Goals: To be accomplished in 5 treatments.  Patient and/or caregiver will verbalize 3/3 signs and symptoms of infection without external cueing, in order to reduce the risk of infection and skin impairment and to promote optimal self management of condition.    2.   Patient to perform 5/5 lymphedema remedial exercises in session with modified independence utilizing HEP handout, in order to promote optimal independence with management of         condition, as well as promote optimal limb volume reduction required for proper fit of donned clothing in 6 weeks.     Long Term Goals: To be accomplished in 10 treatments.  1.  Patient will demonstrate an improvement in  self perceived functional impairment as evidenced by an improved score on the Lymphedema Life Impact Scale from 43% to 39%.   2.  Patient will demonstrate a decrease in girth measurement of the trunk by 2 cm waist/hips demonstrating a reduction of swelling in the trunk for increased comfort and ease of  donning and doffing         clothing.  3.  Patient will demonstrate a loss of volume of the 500 ml in the legs bilaterally to reduce the risk of infection and progression of swelling over time for ease of performing lower body dressing and safe mobility.  4.   Patient will be measured for and obtain comfortable and optimal fitting compression products to prevent reaccumulation of fluid at discharge which could impair patient's ability to perform         safe mobility and dressing.   5.  Patient will obtain a home vaso-pneumatic device if appropriate and use at least 4 days each week to prevent reaccumulation of fluid for improved tolerance of mobility and ambulation and/or decrease         the feeling of limb heaviness with bathing and dressing during the restorative phase of care.     Frequency / Duration: Patient to be seen 5 to 10 visits over the next 12 weeks.      Patient/ Caregiver education and instruction: Diagnosis, prognosis, treatment plan [x]   Plan of care has been reviewed with PTA    Certification Period: 11/29/2021-02/24/2022    02/26/2022, PT, CLT       11/29/2021       7:31 AM        ===================================================================  I certify that the above Therapy Services are being furnished while the patient is under my care. I agree with the treatment plan and certify that this therapy is necessary.    Physician's Signature:_________________________   DATE:_________   TIME:________                           12/01/2021, NP    ** Signature, Date and Time must be completed for valid certification **  Please sign and fax to [phone].  Thank you

## 2021-12-12 DIAGNOSIS — M5416 Radiculopathy, lumbar region: Secondary | ICD-10-CM | POA: Diagnosis not present

## 2021-12-14 ENCOUNTER — Inpatient Hospital Stay: Admit: 2021-12-14 | Payer: MEDICARE | Primary: Geriatric Medicine

## 2021-12-14 DIAGNOSIS — I890 Lymphedema, not elsewhere classified: Secondary | ICD-10-CM

## 2021-12-14 NOTE — Progress Notes (Signed)
PHYSICAL THERAPY/OCCUPATIONAL THERAPY - MEDICARE DAILY TREATMENT NOTE (updated 3/23)    Date: 12/14/2021        Patient Name:  Allison Newman DOB:  1940-07-28   Medical   Diagnosis:  Lymphedema, not elsewhere classified [I89.0] Treatment Diagnosis:  I89.0     Lymphedema, not elsewhere classified    Referral Source:  Rosalie Doctor, NP Insurance:   Payor: [employer] MEDICARE / Plan: Francine [employer]ven GOLD PLUS HMO / Product Type: *No Product type* /                   Patient DOB verified yes     Visit #   Current  / Total 2 10   Time   In / Out 7:30 am 8:30 am   Total Treatment Time 60   Total Timed Codes 60   1:1 Treatment Time 60      [facility] Totals Reminder:  bill using total billable   min of TIMED therapeutic procedures and modalities.   8-22 min = 1 unit; 23-37 min = 2 units; 38-52 min = 3 units;  53-67 min = 4 units; 68-82 min = 5 units     SUBJECTIVE    Pain Level (0-10 scale): c/o R knee pain at rest and with activity due to arthritis. Repositioned on mat table for better comfort.     Any medication changes, allergies to medications, adverse drug reactions, diagnosis change, or new procedure performed?: [x]  No    []  Yes (see summary sheet for update)  Medications: Verified on Patient Summary List    Subjective functional status/changes:  Patient arrives to the visit accompanied by son and transported dependently by wheelchair. Patient admits to being sedentary at home but is making an effort to keep legs elevated when in chair.    OBJECTIVE    Therapeutic Procedures:  Tx Min Billable or 1:1 Min (if diff from Tx Min) Procedure, Rationale, Specifics   15 15 97110 Therapeutic Exercise (timed):  increase ROM, strength, coordination, balance, and proprioception to improve patient's ability to progress to PLOF and address remaining functional goals. (see flow sheet as applicable)    Details if applicable:    Therapeutic Exercise:  []  Exercises [x]  Remedial Lymphedema Exercise Program - LE active ROM routine with  modifications due to R knee pain.  Provided patient with written instructions to follow []  Axillary Web Exercise Program   []  Stick Routine     15 15 97535 Self Care/Home Management (timed):  improve patient knowledge and understanding of positioning, skin care  to improve patient's ability to progress to PLOF and address remaining functional goals.  (see flow sheet as applicable)    Details if applicable:      Skin/wound care/debridement:   Reviewed skin care principles:   Washed the legs and applied Remedy lotion today  Application following MLD principles  Signs and symptoms of cellulitis  Prevention of cellulitis  How to care for skin injuries           5 5 97760 Orthotic Management and Training LE (timed): improve positioning of lower extremity during weight bearing and gait, improve pressure distrubution of the plantar aspect of the foot to improve patient's ability to progress to PLOF and address remaining functional goals.    Details if applicable:   Upper/Lower Extremity Compression: Discussed the role and benefit of compression products for management of swelling and provided samples of products and discussed pricing today.   Patient may be interested  in obtaining compression products in a future visit.      25 25 97140 Manual Therapy (timed):  decrease pain, increase tissue extensibility, and decrease edema to improve patient's ability to progress to PLOF and address remaining functional goals.  The manual therapy interventions were performed at a separate and distinct time from the therapeutic activities interventions . (see flow sheet as applicable)    Details if applicable:    Manual Lymphatic Drainage (MLD):  Area to decongest: B LE and trunk   Sequence used and effectiveness: Secondary sequence for lower extremities with trunk involvement. Cervical techniques deferred due to age and co-morbidities. Initiated education in self MLD.             60 60    Total Total     Skin assessment post-treatment  (if applicable):    [x]   intact    []   redness- no adverse reaction                 [] redness - adverse reaction:        Patient Education: [x]  Review HEP    [x]   Patient Education billed concurrently with other procedures   [x]  MLD Patient Education Reviewed with patient, as well as demonstration and instruction during MLD portion of session and Continued education in self MLD technique with bathing and skin care  []  Progressed/Changed HEP based on:   [x]  positioning   []  Kinesiotape   [x]  Skin care   []  wound care   [x]  other: compression options  Patient / caregiver re-demonstrated bandaging. []  Yes  []  No  Compression bandaging/garment precautions reviewed: [x]  Yes  []  No      Other Objective/Functional Measures  Full LE volumes deferred today.    Pain Level at end of session (0-10 scale): Continual R knee pain -  not rated on numeric scale.    Assessment   Patient is returning today for the first visit since evaluation 11/29/21. Treatment today focused on therapeutic exercise and manual lymphatic drainage. Discussed positioning, importance of elevating legs, and to avoid sitting for prolonged periods at one time. Patient would benefit from daily use of compression products for management of swelling but patient's insurance does not provide garment coverage and patient is not ready to move forward with ordering garments at this time. She will work on her home program and return in 2 weeks for assessment of swelling and education in self MLD packet.   Patient will continue to benefit from skilled PT / OT services to address swelling, analyze and address soft tissue restrictions, analyze compression product fit and use, instruct in home lymphedema management program, measure for compression products, and modify and progress therapeutic interventions to address functional deficits and attain remaining goals.    Progress toward goals / Updated goals:  []   See Progress Note/Recertification    Short Term Goals: To be  accomplished in 5 treatments.  Patient and/or caregiver will verbalize 3/3 signs and symptoms of infection without external cueing, in order to reduce the risk of infection and skin impairment and to promote optimal self management of condition.  In progress  2.   Patient to perform 5/5 lymphedema remedial exercises in session with modified independence utilizing HEP handout, in order to promote optimal independence with management of         condition, as well as promote optimal limb volume reduction required for proper fit of donned clothing in 6 weeks. In progress     Long Term  Goals: To be accomplished in 10 treatments.  1.  Patient will demonstrate an improvement in self perceived functional impairment as evidenced by an improved score on the Lymphedema Life Impact Scale from 43% to 39%.   2.  Patient will demonstrate a decrease in girth measurement of the trunk by 2 cm waist/hips demonstrating a reduction of swelling in the trunk for increased comfort and ease of donning and doffing         clothing.  3.  Patient will demonstrate a loss of volume of the 500 ml in the legs bilaterally to reduce the risk of infection and progression of swelling over time for ease of performing lower body dressing and safe mobility.  4.   Patient will be measured for and obtain comfortable and optimal fitting compression products to prevent reaccumulation of fluid at discharge which could impair patient's ability to perform         safe mobility and dressing.   5.  Patient will obtain a home vaso-pneumatic device if appropriate and use at least 4 days each week to prevent reaccumulation of fluid for improved tolerance of mobility and ambulation and/or decrease         the feeling of limb heaviness with bathing and dressing during the restorative phase of care.     PLAN  Yes  Continue plan of care  Re-Cert Due: 16/03/930  []   Upgrade activities as tolerated  []   Discharge due to :  []   Other:    , PT, CLT        12/14/2021       7:02 AM

## 2021-12-20 DIAGNOSIS — Z6832 Body mass index (BMI) 32.0-32.9, adult: Secondary | ICD-10-CM | POA: Diagnosis not present

## 2021-12-20 DIAGNOSIS — E785 Hyperlipidemia, unspecified: Secondary | ICD-10-CM | POA: Diagnosis not present

## 2021-12-20 DIAGNOSIS — E669 Obesity, unspecified: Secondary | ICD-10-CM | POA: Diagnosis not present

## 2021-12-20 DIAGNOSIS — N1831 Chronic kidney disease, stage 3a: Secondary | ICD-10-CM | POA: Diagnosis not present

## 2021-12-20 DIAGNOSIS — K219 Gastro-esophageal reflux disease without esophagitis: Secondary | ICD-10-CM | POA: Diagnosis not present

## 2021-12-20 DIAGNOSIS — Z008 Encounter for other general examination: Secondary | ICD-10-CM | POA: Diagnosis not present

## 2021-12-20 DIAGNOSIS — I129 Hypertensive chronic kidney disease with stage 1 through stage 4 chronic kidney disease, or unspecified chronic kidney disease: Secondary | ICD-10-CM | POA: Diagnosis not present

## 2021-12-22 NOTE — Telephone Encounter (Signed)
Formatting of this note is different from the original.      Medication refill request     Medication Refill Request: Requested Prescriptions     Pending Prescriptions Disp Refills   ? lisinopril (Prinivil, Zestril) 10 MG tablet [Pharmacy Med Name: LISINOPRIL 10 MG Tablet] 90 tablet 3     Sig: TAKE 1 TABLET EVERY DAY       Date/Time entered:   12/22/21 8:31 AM   Medication on med list?  (Yes/No, if No please alert provider in "Notes to Provider/Details" above) yes   Medication due for refill?  (Written date, quantity ordered, how many refills?) yes   Medication pended: Yes   Quantity Requested: Set to 90 day supply    Refills Requested: Set to 3 refills   Preferred Pharmacy Set Reviewed and set to preferred pharmacy.     LOV - Any Provider 08/18/2021   NOV - Any Provider 03/23/2022     Velia Meyer, MA  Electronically signed by Velia Meyer, MA at 12/22/2021  8:31 AM EDT

## 2021-12-28 ENCOUNTER — Inpatient Hospital Stay: Admit: 2021-12-28 | Payer: MEDICARE | Primary: Geriatric Medicine

## 2021-12-28 NOTE — Progress Notes (Signed)
[facility]  a part of [facility]   [address] [place], [address]  [place], [place] [address]  Phone: [phone]  Fax: [phone]    PHYSICAL THERAPY/OCCUPATIONAL THERAPY PROGRESS NOTE  Patient Name:  Allison Newman DOB:  January 09, 1941   Treatment/Medical Diagnosis: Lymphedema, not elsewhere classified [I89.0]   Referral Source:  Rosalie Doctor, NP     Date of Initial Visit:  11/29/2021 Attended Visits:  3 Missed Visits:  0     SUMMARY OF TREATMENT/ASSESSMENT:  Patient has been seen for 3 visits in the clinic. Treatment has focused on exercise, skin care, and manual lymphatic drainage. Patient has been educated in compression product options and is ready to order one compression product for the lower legs, alternating legs each day. Full LE volumes taken today have increased greater than 1,000 ml since evaluation. Continued education in positioning and to make an effort to avoid sitting for long periods at one time. Patient's condition is complicated by decreased endurance/COPD, debilitating arthritis, obesity, inability to sleep in a bed, and reliance on her son for self care at times. Patient will continue with her home program to the best of her ability and return in 2 weeks for fitting of new compression products.      Patient will continue to benefit from skilled PT / OT services to address swelling, analyze and address soft tissue restrictions, analyze compression product fit and use, instruct in home lymphedema management program, measure for compression products, and modify and progress therapeutic interventions to address functional deficits and attain remaining goals.    CURRENT STATUS      Short Term Goals: To be accomplished in 5 treatments.  Patient and/or caregiver will verbalize 3/3 signs and symptoms of infection without external cueing, in order to reduce the risk of infection and skin impairment and to promote optimal self management of condition.  In progress  2.   Patient  to perform 5/5 lymphedema remedial exercises in session with modified independence utilizing HEP handout, in order to promote optimal independence with management of         condition, as well as promote optimal limb volume reduction required for proper fit of donned clothing in 6 weeks. In progress     Long Term Goals: To be accomplished in 10 treatments.  1.  Patient will demonstrate an improvement in self perceived functional impairment as evidenced by an improved score on the Lymphedema Life Impact Scale from 43% to 39%.   2.  Patient will demonstrate a decrease in girth measurement of the trunk by 2 cm waist/hips demonstrating a reduction of swelling in the trunk for increased comfort and ease of donning and doffing         clothing.  3.  Patient will demonstrate a loss of volume of the 500 ml in the legs bilaterally to reduce the risk of infection and progression of swelling over time for ease of performing lower body dressing and safe mobility. Assessed today and have volumes have increased  4.   Patient will be measured for and obtain comfortable and optimal fitting compression products to prevent reaccumulation of fluid at discharge which could impair patient's ability to perform         safe mobility and dressing. In progress  5.  Patient will obtain a home vaso-pneumatic device if appropriate and use at least 4 days each week to prevent reaccumulation of fluid for improved tolerance of mobility and ambulation and/or decrease  the feeling of limb heaviness with bathing and dressing during the restorative phase of care.       RECOMMENDATIONS  Garment fitting    Johna Sheriff, PT, CLT       12/28/2021       9:07 AM    If you have any questions/comments please contact us directly at [phone].   Thank you for allowing [place] to assist in the care of your patient.

## 2021-12-28 NOTE — Progress Notes (Signed)
PHYSICAL THERAPY/OCCUPATIONAL THERAPY - MEDICARE DAILY TREATMENT NOTE (updated 3/23)    Date: 12/28/2021        Patient Name:  Allison Newman DOB:  September 11, 1940   Medical   Diagnosis:  Lymphedema, not elsewhere classified [I89.0] Treatment Diagnosis:  I89.0     Lymphedema, not elsewhere classified    Referral Source:  Rosalie Doctor, NP Insurance:   Payor: [employer] MEDICARE / Plan: Francine Graven GOLD PLUS HMO / Product Type: *No Product type* /                   Patient DOB verified yes     Visit #   Current  / Total 3 10   Time   In / Out 7:30 am 8:45 am   Total Treatment Time 75   Total Timed Codes 75   1:1 Treatment Time 75      [facility] BC Totals Reminder:  bill using total billable   min of TIMED therapeutic procedures and modalities.   8-22 min = 1 unit; 23-37 min = 2 units; 38-52 min = 3 units;  53-67 min = 4 units; 68-82 min = 5 units     SUBJECTIVE    Pain Level (0-10 scale): patient reports constant L > R knee pain from arthritis - repositioned on mat table for increased comfort    Any medication changes, allergies to medications, adverse drug reactions, diagnosis change, or new procedure performed?: [x]  No    []  Yes (see summary sheet for update)  Medications: Verified on Patient Summary List    Subjective functional status/changes:  Patient arrives to the visit accompanied by son and transported dependently by wheelchair. Patient is making an effort to massage her legs and keep them elevated throughout the day.     OBJECTIVE    Therapeutic Procedures:  Tx Min Billable or 1:1 Min (if diff from Tx Min) Procedure, Rationale, Specifics   5 5 97110 Therapeutic Exercise (timed):  increase ROM, strength, coordination, balance, and proprioception to improve patient's ability to progress to PLOF and address remaining functional goals. (see flow sheet as applicable)    Details if applicable:    Therapeutic Exercise:  []  Exercises [x]  Remedial Lymphedema Exercise Program - toe crunches, toe spread, ankle pumps and  circles, quad sets  Provided patient with written instructions to follow []  Axillary Web Exercise Program   []  Stick Routine     15 15 97535 Self Care/Home Management (timed):  improve patient knowledge and understanding of positioning, skin care  to improve patient's ability to progress to PLOF and address remaining functional goals.  (see flow sheet as applicable)    Details if applicable:      Skin/wound care/debridement:   Reviewed skin care principles:   Application following MLD principles  Signs and symptoms of cellulitis  Prevention of cellulitis  How to care for skin injuries - patient with one small wound (0.5 X 1.0 cm) on the left lower leg anteriorly which appears to be from a scratch. Washed the wound and applied A and D ointment to open  area and covered with bandage and Remedy lotion to intact skin. Provided patient with additional wound care supplies and discussed changing bandage each day.        Positioning:  continued education in elevating legs throughout the day. Patient unable to lie supine in a bed.    25 25 97760 Orthotic Management and Training LE (timed): improve positioning of lower extremity during weight bearing  and gait, improve pressure distrubution of the plantar aspect of the foot to improve patient's ability to progress to PLOF and address remaining functional goals.    Details if applicable:   Upper/Lower Extremity Compression: Discussed the role and benefit of compression products for management of swelling and provided samples of products. Patient's insurance does not provide garment coverage. Patient is agreeable to pay a cash price for compression products and is ready to order one velcro product for the lower legs and may be interested in ordering a second product in a future visit.     Upper/Lower Extremity Compression: Measured for the following compression products:    B LE      Style: Velcro    One solaris ready wrap calf unit size XL regular length in color black  One  pair of Circaid compressive undersocks size large 15-25 mmHg    Vendor: [employer]    Initiated education: in compression garment donning and doffing,  daily wear schedule, laundering instructions, garment lifespan, return and reordering process (by bringing prior garments into the clinic).    Patient will return to the clinic for fitting.      30 30 97140 Manual Therapy (timed):  decrease pain, increase tissue extensibility, and decrease edema to improve patient's ability to progress to PLOF and address remaining functional goals.  The manual therapy interventions were performed at a separate and distinct time from the therapeutic activities interventions . (see flow sheet as applicable)    Details if applicable:    Manual Lymphatic Drainage (MLD):  Area to decongest: B LE and trunk   Sequence used and effectiveness: Secondary sequence for lower extremities with trunk involvement. Cervical techniques deferred due to age and co-morbidities. Continued education in self MLD with focus on deep abdominal breathing and the legs.              75 75    Total Total       Patient Education: [x]  Review HEP    [x]   Patient Education billed concurrently with other procedures   [x]  MLD Patient Education Reviewed with patient, as well as demonstration and instruction during MLD portion of session and Continued education in self MLD technique with bathing and skin care  []  Progressed/Changed HEP based on:   [x]  positioning   []  Kinesiotape   [x]  Skin care   [x]  wound care   [x]  other: compression options  Patient / caregiver re-demonstrated bandaging. []  Yes  []  No  Compression bandaging/garment precautions reviewed: [x]  Yes  []  No      Other Objective/Functional Measures  Full LE volumes:   R lower extremity 17,159.79 ml today compared to 15,956.30 ml on evaluation 11/29/21  L lower extremity 17,889.39 ml today compared to 16,492.50 ml on evaluation 11/29/21  Percentage difference is 4.25% compared to 3.36% on  evaluation    Pain Level at end of session (0-10 scale): patient reports constant L > R knee pain from arthritis - worse with knee flexion and weight bearing activities.     Assessment:  Patient has been seen for 3 visits in the clinic. Treatment has focused on exercise, skin care, and manual lymphatic drainage. Patient has been educated in compression product options and is ready to order one compression product for the lower legs, alternating legs each day. Full LE volumes taken today have increased greater than 1,000 ml since evaluation. Continued education in positioning and to make an effort to avoid sitting for long periods at one  time. Patient's condition is complicated by decreased endurance/COPD, debilitating arthritis, obesity, inability to sleep in a bed, and reliance on her son for self care at times. Patient will continue with her home program to the best of her ability and return in 2 weeks for fitting of new compression products.     Patient will continue to benefit from skilled PT / OT services to address swelling, analyze and address soft tissue restrictions, analyze compression product fit and use, instruct in home lymphedema management program, measure for compression products, and modify and progress therapeutic interventions to address functional deficits and attain remaining goals.    Progress toward goals / Updated goals:  [x]   See Progress Note/Recertification    Short Term Goals: To be accomplished in 5 treatments.  Patient and/or caregiver will verbalize 3/3 signs and symptoms of infection without external cueing, in order to reduce the risk of infection and skin impairment and to promote optimal self management of condition.  In progress  2.   Patient to perform 5/5 lymphedema remedial exercises in session with modified independence utilizing HEP handout, in order to promote optimal independence with management of         condition, as well as promote optimal limb volume reduction required  for proper fit of donned clothing in 6 weeks. In progress     Long Term Goals: To be accomplished in 10 treatments.  1.  Patient will demonstrate an improvement in self perceived functional impairment as evidenced by an improved score on the Lymphedema Life Impact Scale from 43% to 39%.   2.  Patient will demonstrate a decrease in girth measurement of the trunk by 2 cm waist/hips demonstrating a reduction of swelling in the trunk for increased comfort and ease of donning and doffing         clothing.  3.  Patient will demonstrate a loss of volume of the 500 ml in the legs bilaterally to reduce the risk of infection and progression of swelling over time for ease of performing lower body dressing and safe mobility. Assessed today and have volumes have increased  4.   Patient will be measured for and obtain comfortable and optimal fitting compression products to prevent reaccumulation of fluid at discharge which could impair patient's ability to perform         safe mobility and dressing. In progress  5.  Patient will obtain a home vaso-pneumatic device if appropriate and use at least 4 days each week to prevent reaccumulation of fluid for improved tolerance of mobility and ambulation and/or decrease         the feeling of limb heaviness with bathing and dressing during the restorative phase of care.     PLAN  Yes  Continue plan of care  Re-Cert Due: 82/95/6213  []   Upgrade activities as tolerated  []   Discharge due to :  []   Other:    Johna Sheriff, PT, CLT       12/28/2021       8:46 AM

## 2022-01-02 DIAGNOSIS — M5416 Radiculopathy, lumbar region: Secondary | ICD-10-CM | POA: Diagnosis not present

## 2022-01-02 DIAGNOSIS — M48061 Spinal stenosis, lumbar region without neurogenic claudication: Secondary | ICD-10-CM | POA: Diagnosis not present

## 2022-01-03 ENCOUNTER — Telehealth: Payer: Self-pay | Admitting: Family Medicine

## 2022-01-03 DIAGNOSIS — Z6830 Body mass index (BMI) 30.0-30.9, adult: Secondary | ICD-10-CM | POA: Diagnosis not present

## 2022-01-03 DIAGNOSIS — M5416 Radiculopathy, lumbar region: Secondary | ICD-10-CM | POA: Diagnosis not present

## 2022-01-03 MED ORDER — AMLODIPINE BESYLATE 5 MG PO TABS: 5.0000 mg | ORAL_TABLET | Freq: Every day | ORAL | 0 refills | Status: DC

## 2022-01-03 NOTE — Telephone Encounter (Signed)
Please call patient and schedule.  Per AVS at last visit:  Return in about 2 months (around 07/10/2021) for  Follow up HTN, and back pain.  I do not see a medication on her current list for dizziness.

## 2022-01-03 NOTE — Telephone Encounter (Signed)
Patient called in and stated that she forgot to inform Dr. Diona Browner about her medication for dizziness. She stated she is taking Meclizine and she needs refill because she is out. She also needs a refill on Amlodipine Besylate. They can be sent over to [id number] - [place], [address]. Please advise. Thank you!

## 2022-01-03 NOTE — Telephone Encounter (Signed)
Patient has been scheduled

## 2022-01-05 ENCOUNTER — Ambulatory Visit (INDEPENDENT_AMBULATORY_CARE_PROVIDER_SITE_OTHER): Payer: No Typology Code available for payment source | Admitting: Family Medicine

## 2022-01-05 VITALS — BP 122/80 | HR 80 | Temp 97.7°F | Ht 67.0 in | Wt 219.5 lb

## 2022-01-05 DIAGNOSIS — M7061 Trochanteric bursitis, right hip: Secondary | ICD-10-CM | POA: Diagnosis not present

## 2022-01-05 DIAGNOSIS — H8113 Benign paroxysmal vertigo, bilateral: Secondary | ICD-10-CM | POA: Diagnosis not present

## 2022-01-05 DIAGNOSIS — I10 Essential (primary) hypertension: Secondary | ICD-10-CM

## 2022-01-05 DIAGNOSIS — E559 Vitamin D deficiency, unspecified: Secondary | ICD-10-CM

## 2022-01-05 DIAGNOSIS — G8929 Other chronic pain: Secondary | ICD-10-CM | POA: Diagnosis not present

## 2022-01-05 DIAGNOSIS — Z862 Personal history of diseases of the blood and blood-forming organs and certain disorders involving the immune mechanism: Secondary | ICD-10-CM

## 2022-01-05 DIAGNOSIS — E041 Nontoxic single thyroid nodule: Secondary | ICD-10-CM

## 2022-01-05 DIAGNOSIS — M5441 Lumbago with sciatica, right side: Secondary | ICD-10-CM | POA: Diagnosis not present

## 2022-01-05 MED ORDER — MECLIZINE HCL 25 MG PO TABS: 25.0000 mg | ORAL_TABLET | Freq: Three times a day (TID) | ORAL | 0 refills | Status: DC | PRN

## 2022-01-05 MED ORDER — TRAMADOL HCL 50 MG PO TABS: 50.0000 mg | ORAL_TABLET | Freq: Four times a day (QID) | ORAL | 0 refills | Status: DC | PRN

## 2022-01-05 NOTE — Assessment & Plan Note (Signed)
Chronic, some improvement with physical therapy.  Dr. Ronnald Ramp neurosurgery has now referred her to pain management.  She is status post 2 epidural steroid injections at L5 and L3.  She has plans to return for additional epidural steroid injection at L4 on November 10.  We are avoiding NSAIDs as she had significant decline in kidney function when she was on meloxicam.  She will try tramadol as needed for breakthrough pain.

## 2022-01-05 NOTE — Patient Instructions (Addendum)
Can use tramadol as needed for breakthrough pain.  Avoid aleve and ibuprofen etc. Because of kidney function.  Start hip stretches for possible trochanteric bursitis  If right hip still painful after back injection.. call for recommendations.

## 2022-01-05 NOTE — Progress Notes (Signed)
Patient ID: Margaret Gordon, female    DOB: February 14, 1941, 81 y.o.   MRN: [id number]  This visit was conducted in person.  BP 122/80   Pulse 80   Temp 97.7 F (36.5 C) (Temporal)   Ht 5\' 7"  (1.702 m)   Wt 219 lb 8 oz (99.6 kg)   SpO2 99%   BMI 34.38 kg/m    CC:  Chief Complaint  Patient presents with   Follow-up    HTN and back pain    Dizziness    On and off. Would like an meclizine rx to keep on hand.     Subjective:   HPI: Margaret Gordon is a 81 y.o. female presenting on 01/05/2022 for Follow-up (HTN and back pain ) and Dizziness (On and off. Would like an meclizine rx to keep on hand. )  Hypertension:  Blood pressure appears well controlled today on amlodipine 5 mg p.o. daily and telmisartan hydrochlorothiazide 40/12.5 mg daily. She uses Lasix 20 mg daily as well. BP Readings from Last 3 Encounters:  01/05/22 122/80  05/10/21 130/78  04/07/21 100/64  Using medication without problems or lightheadedness: none Chest pain with exertion:none Edema:none Short of breath:none Average home BPs: Other issues: At last office visit in February 2023 she had decreased renal function but this had improved on recheck in March 2 GFR of 55.  Wt Readings from Last 3 Encounters:  01/05/22 219 lb 8 oz (99.6 kg)  05/10/21 216 lb 1 oz (98 kg)  04/07/21 213 lb 2 oz (96.7 kg)    Chronic back pain: Followed by Dr. 06/05/21 neurosurgery.  She has undergone physical therapy.  Was on meloxicam in the past but held given decreased renal function. She has been going to Gym 3 days a week with granddaughter.  She has been referred to pain management.. she had ESI L3/L5. Improved some , but still some pain and numbness... is scheduled for L4 injection Nov 10.  She has also been told she has right trochanteric bursitis.   Tramadol did help in past for pain.  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 Interim medical history since our last visit reviewed. Allergies and medications reviewed and updated. Outpatient Medications Prior to Visit  Medication Sig Dispense Refill   amLODipine (NORVASC) 5 MG tablet Take 1 tablet (5 mg total) by mouth daily. 30 tablet 0   APPLE CIDER VINEGAR PO Take 1 each by mouth every morning.     atorvastatin (LIPITOR) 10 MG tablet Take 10 mg by mouth daily.     FIBER ADULT GUMMIES PO Take 1 each by mouth daily.     furosemide (LASIX) 20 MG tablet Take 20 mg by mouth daily.     Multiple Vitamin (MULTIVITAMIN WITH MINERALS) TABS tablet Take 1 tablet by mouth at bedtime.     pantoprazole (PROTONIX) 40 MG tablet Take 1 tablet (40 mg total) by mouth daily. 30 tablet 5   telmisartan-hydrochlorothiazide (MICARDIS HCT) 40-12.5 MG tablet Take 1 tablet by mouth daily.     traMADol (ULTRAM) 50 MG tablet Take 50 mg by mouth every 6 (six) hours as needed.     No facility-administered medications prior to visit.     Per HPI unless specifically indicated in ROS section below Review of Systems  Constitutional:  Negative for fatigue and fever.  HENT:  Negative for congestion.   Eyes:  Negative for pain.  Respiratory:  Negative for cough and shortness of breath.   Cardiovascular:  Negative for chest pain, palpitations and leg swelling.  Gastrointestinal:  Negative for abdominal pain.  Genitourinary:  Negative for dysuria and vaginal bleeding.  Musculoskeletal:  Positive for arthralgias, back pain and gait problem.  Neurological:  Negative for syncope, light-headedness and headaches.  Psychiatric/Behavioral:  Negative for dysphoric mood.    Objective:  BP 122/80   Pulse 80   Temp 97.7 F (36.5 C) (Temporal)   Ht 5\' 7"  (1.702 m)   Wt 219 lb 8 oz (99.6 kg)   SpO2 99%   BMI 34.38 kg/m   Wt Readings from Last 3 Encounters:  01/05/22 219 lb 8 oz (99.6 kg)  05/10/21 216 lb 1 oz (98 kg)  04/07/21  213 lb 2 oz (96.7 kg)      Physical Exam Constitutional:      General: She is not in acute distress.    Appearance: Normal appearance. She is well-developed. She is not ill-appearing or toxic-appearing.  HENT:     Head: Normocephalic.     Right Ear: Hearing, tympanic membrane, ear canal and external ear normal. Tympanic membrane is not erythematous, retracted or bulging.     Left Ear: Hearing, tympanic membrane, ear canal and external ear normal. Tympanic membrane is not erythematous, retracted or bulging.     Nose: No mucosal edema or rhinorrhea.     Right Sinus: No maxillary sinus tenderness or frontal sinus tenderness.     Left Sinus: No maxillary sinus tenderness or frontal sinus tenderness.     Mouth/Throat:     Pharynx: Uvula midline.  Eyes:     General: Lids are normal. Lids are everted, no foreign bodies appreciated.     Conjunctiva/sclera: Conjunctivae normal.     Pupils: Pupils are equal, round, and reactive to light.  Neck:     Thyroid: No thyroid mass or thyromegaly.     Vascular: No carotid bruit.     Trachea: Trachea normal.  Cardiovascular:     Rate and Rhythm: Normal rate and regular rhythm.     Pulses: Normal pulses.     Heart sounds: Normal heart sounds, S1 normal and S2 normal. No murmur heard.    No friction rub. No gallop.  Pulmonary:     Effort: Pulmonary effort is normal. No tachypnea or respiratory distress.     Breath sounds: Normal breath sounds. No decreased breath sounds, wheezing, rhonchi or rales.  Abdominal:     General: Bowel sounds are normal.     Palpations: Abdomen is soft.     Tenderness: There is no abdominal tenderness.  Musculoskeletal:     Cervical back: Normal, normal range of motion and neck supple.     Thoracic back: Normal.     Lumbar back: No tenderness or bony tenderness. Decreased range of motion. Negative right straight leg raise test and negative left straight leg raise test.     Left hip: Tenderness and bony tenderness present.  Decreased range of motion.     Comments: Tender to palpation over right trochanteric bursa focally.  Skin:    General: Skin is warm and dry.     Findings: No rash.  Neurological:     Mental Status: She is alert.  Psychiatric:        Mood and Affect: Mood is not anxious or depressed.        Speech:  Speech normal.        Behavior: Behavior normal. Behavior is cooperative.        Thought Content: Thought content normal.        Judgment: Judgment normal.       Results for orders placed or performed in visit on 05/10/21  Renal Function Panel  Result Value Ref Range   Sodium 142 135 - 145 mEq/L   Potassium 4.2 3.5 - 5.1 mEq/L   Chloride 103 96 - 112 mEq/L   CO2 34 (H) 19 - 32 mEq/L   Albumin 4.0 3.5 - 5.2 g/dL   BUN 16 6 - 23 mg/dL   Creatinine, Ser 6.71 0.40 - 1.20 mg/dL   Glucose, Bld 86 70 - 99 mg/dL   Phosphorus 2.8 2.3 - 4.6 mg/dL   GFR 24.58 (L) >09.98 mL/min   Calcium 9.6 8.4 - 10.5 mg/dL     COVID 19 screen:  No recent travel or known exposure to COVID19 The patient denies respiratory symptoms of COVID 19 at this time. The importance of social distancing was discussed today.   Assessment and Plan    Problem List Items Addressed This Visit     Benign paroxysmal positional vertigo due to bilateral vestibular disorder   Chronic bilateral low back pain with right-sided sciatica    Chronic, some improvement with physical therapy.  Dr. Yetta Barre neurosurgery has now referred her to pain management.  She is status post 2 epidural steroid injections at L5 and L3.  She has plans to return for additional epidural steroid injection at L4 on November 10.  We are avoiding NSAIDs as she had significant decline in kidney function when she was on meloxicam.  She will try tramadol as needed for breakthrough pain.      Relevant Medications   traMADol (ULTRAM) 50 MG tablet   History of anemia   Relevant Orders   CBC with Differential/Platelet   Hypertension - Primary    Stable,  chronic.  Continue current medication.  Amlodipine 5 mg daily and telmisartan hydrochlorothiazide 40/12.5 mg daily.  She also uses Lasix 20 mg p.o. daily as needed      Relevant Medications   telmisartan-hydrochlorothiazide (MICARDIS HCT) 40-12.5 MG tablet   Other Relevant Orders   Lipid panel   Comprehensive metabolic panel   Hemoglobin A1c   Thyroid nodule   Relevant Orders   TSH   T4, free   T3, free   Trochanteric bursitis of right hip    Acute, possibly secondary to change in gait from sciatica or potentially hip pain is not from bursitis at all.  She will start with a steroid injection in her spine but if her symptoms do not improve with this as well as home physical therapy, she will call for possible appointment with Dr. Dallas Schimke for steroid injection in the hip versus prednisone taper.      Vitamin D deficiency   Relevant Orders   VITAMIN D 25 Hydroxy (Vit-D Deficiency, Fractures)   Meds ordered this encounter  Medications   meclizine (ANTIVERT) 25 MG tablet    Sig: Take 1 tablet (25 mg total) by mouth 3 (three) times daily as needed for dizziness.    Dispense:  30 tablet    Refill:  0   traMADol (ULTRAM) 50 MG tablet    Sig: Take 1 tablet (50 mg total) by mouth every 6 (six) hours as needed.    Dispense:  30 tablet    Refill:  0  PDMP reviewed during this encounter.   Eliezer Lofts, MD

## 2022-01-05 NOTE — Assessment & Plan Note (Signed)
Stable, chronic.  Continue current medication.  Amlodipine 5 mg daily and telmisartan hydrochlorothiazide 40/12.5 mg daily.  She also uses Lasix 20 mg p.o. daily as needed

## 2022-01-05 NOTE — Assessment & Plan Note (Signed)
Acute, possibly secondary to change in gait from sciatica or potentially hip pain is not from bursitis at all.  She will start with a steroid injection in her spine but if her symptoms do not improve with this as well as home physical therapy, she will call for possible appointment with Dr. Edilia Bo for steroid injection in the hip versus prednisone taper.

## 2022-01-11 ENCOUNTER — Encounter: Payer: MEDICARE | Primary: Geriatric Medicine

## 2022-01-13 DIAGNOSIS — M5416 Radiculopathy, lumbar region: Secondary | ICD-10-CM | POA: Diagnosis not present

## 2022-01-17 ENCOUNTER — Encounter: Payer: Self-pay | Admitting: Family Medicine

## 2022-01-18 MED ORDER — PANTOPRAZOLE SODIUM 40 MG PO TBEC: 40.0000 mg | DELAYED_RELEASE_TABLET | Freq: Every day | ORAL | 5 refills | Status: DC

## 2022-01-25 ENCOUNTER — Other Ambulatory Visit: Payer: Self-pay | Admitting: Family Medicine

## 2022-02-01 ENCOUNTER — Other Ambulatory Visit: Payer: No Typology Code available for payment source

## 2022-02-06 ENCOUNTER — Other Ambulatory Visit: Payer: Self-pay | Admitting: Family Medicine

## 2022-02-06 DIAGNOSIS — M47816 Spondylosis without myelopathy or radiculopathy, lumbar region: Secondary | ICD-10-CM | POA: Diagnosis not present

## 2022-02-06 DIAGNOSIS — Z1231 Encounter for screening mammogram for malignant neoplasm of breast: Secondary | ICD-10-CM

## 2022-02-06 DIAGNOSIS — Z6830 Body mass index (BMI) 30.0-30.9, adult: Secondary | ICD-10-CM | POA: Diagnosis not present

## 2022-02-06 DIAGNOSIS — M48062 Spinal stenosis, lumbar region with neurogenic claudication: Secondary | ICD-10-CM | POA: Diagnosis not present

## 2022-02-06 IMAGING — MG MM DIGITAL SCREENING BILAT W/ TOMO AND CAD
8 series · 8 of 24 positions shown · non-contrast
Comparison: Previous exam(s).

CLINICAL DATA: Screening.

EXAM:
DIGITAL SCREENING BILATERAL MAMMOGRAM WITH TOMOSYNTHESIS AND CAD
TECHNIQUE: Bilateral screening digital craniocaudal and mediolateral oblique
mammograms were obtained. Bilateral screening digital breast
tomosynthesis was performed. The images were evaluated with
computer-aided detection.

[R MLO synth-2D]
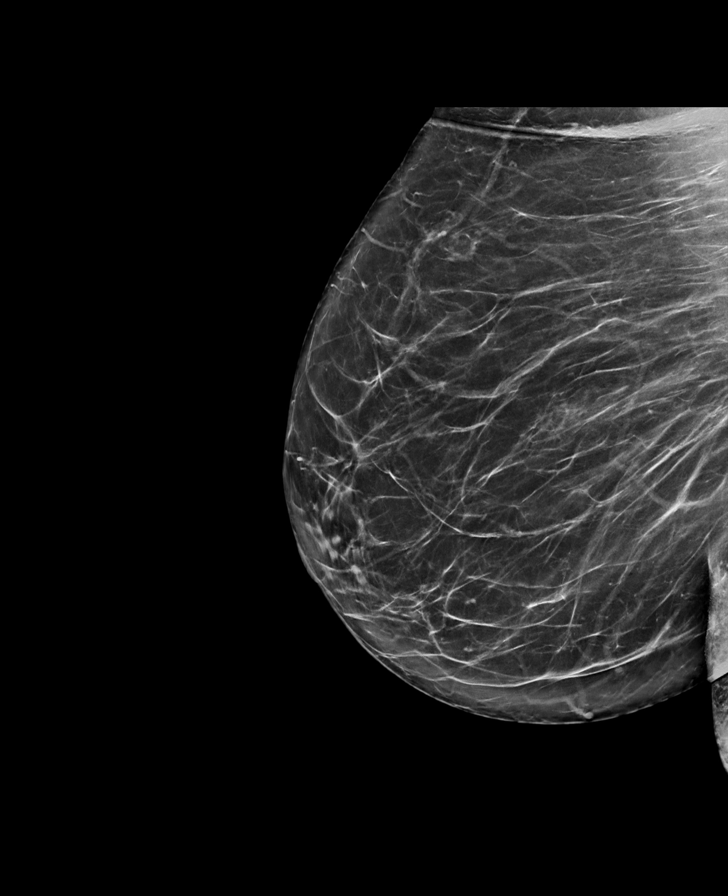

[L CC synth-2D]
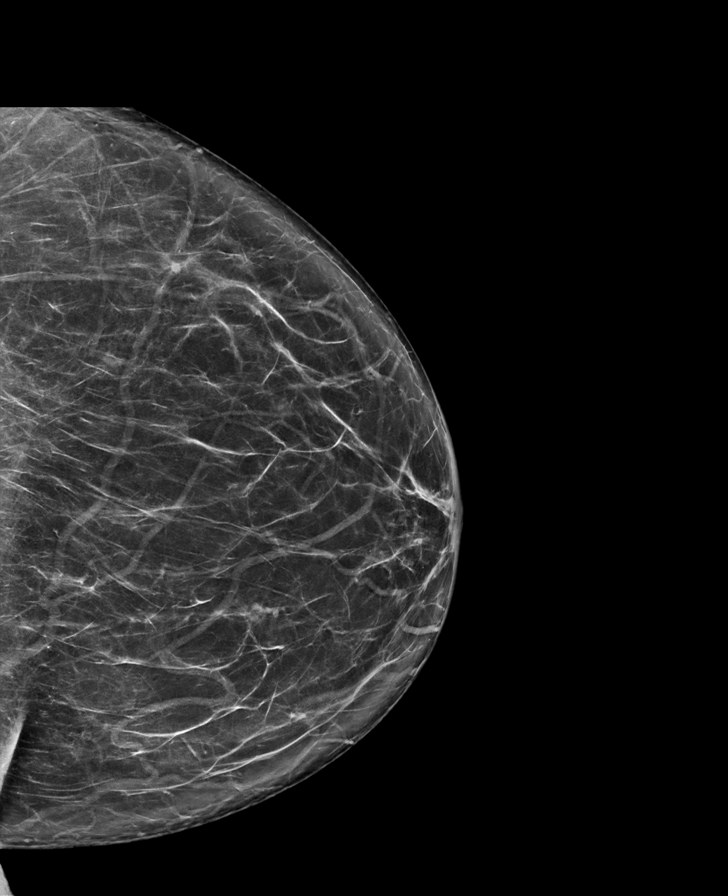

[L MLO synth-2D]
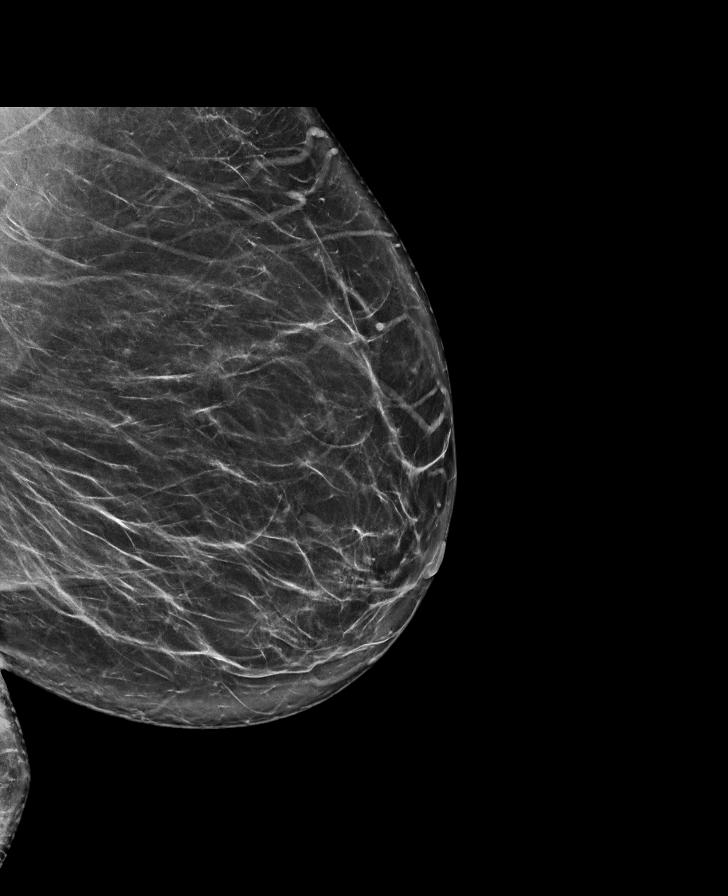

[R CC synth-2D]
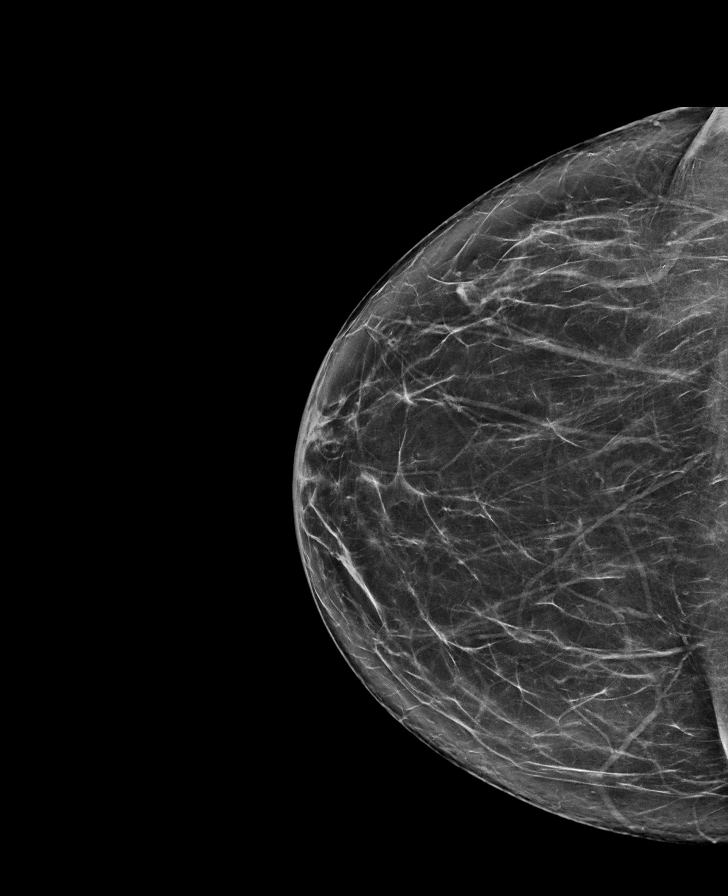

[L CC tomo · tomo slice 41/82.0]
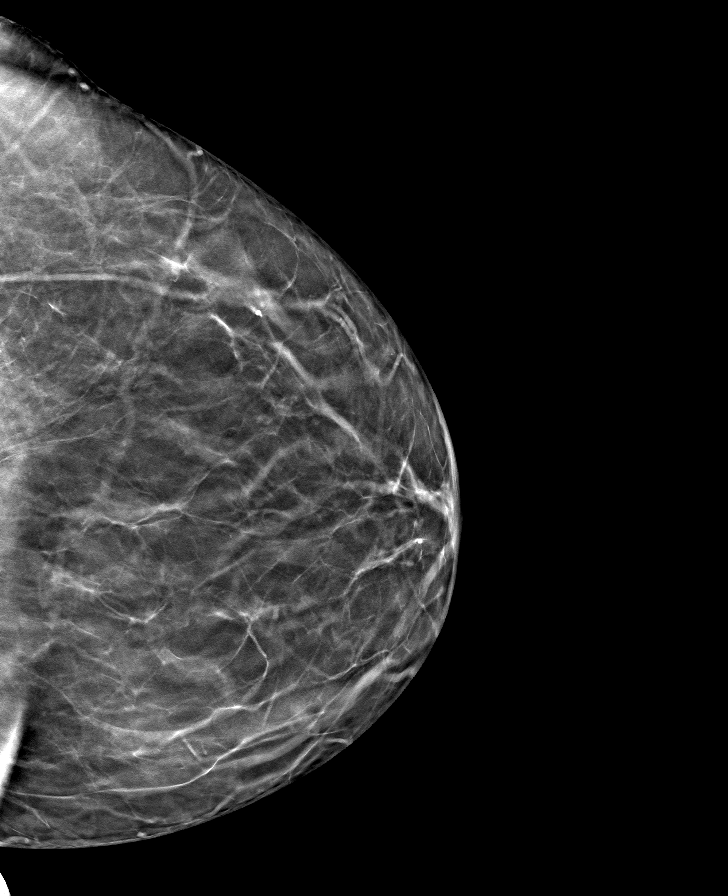

[R CC tomo · tomo slice 41/80.0]
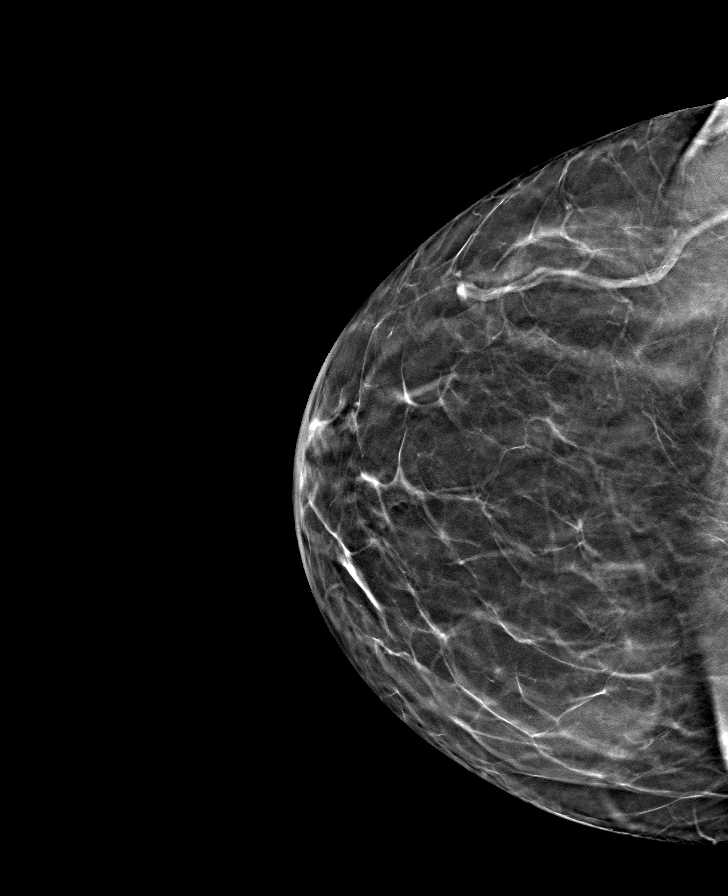

[R MLO tomo · tomo slice 43/85.0]
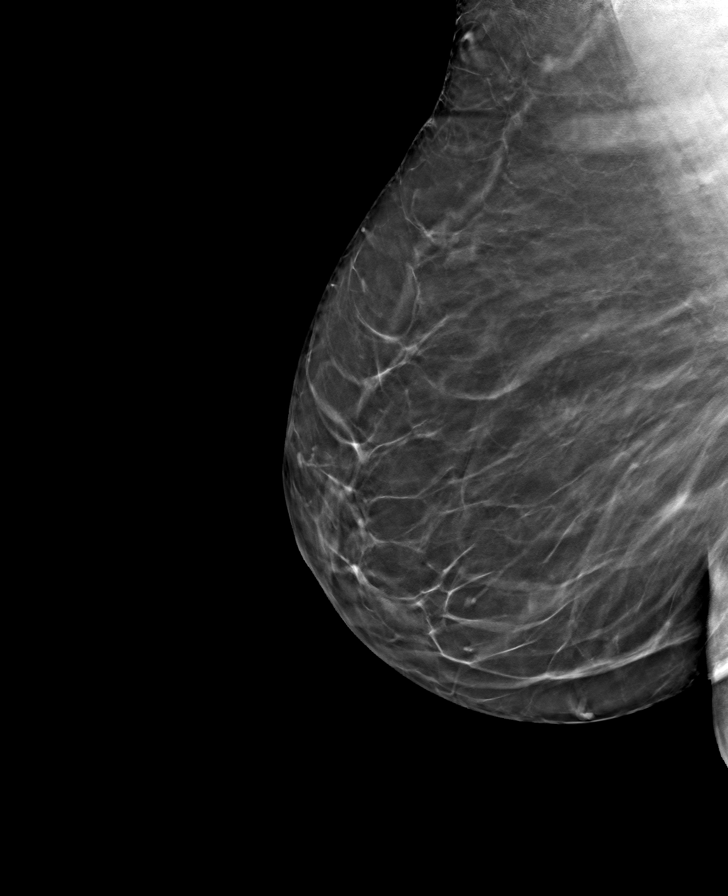

[L MLO tomo · tomo slice 45/89.0]
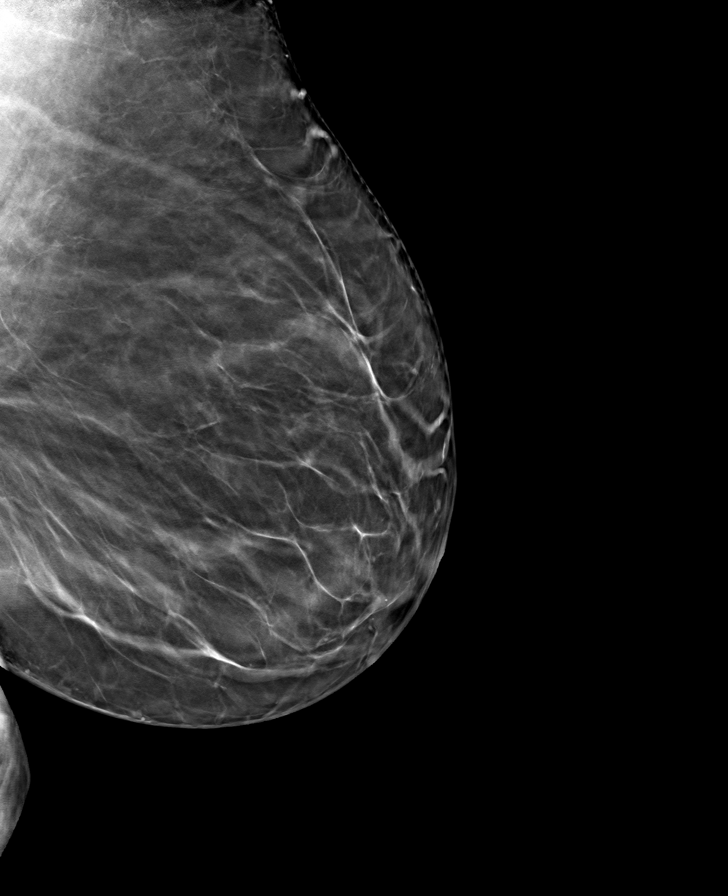

[8 of 24 positions shown; findings below may reference images not displayed]

ACR Breast Density Category b: There are scattered areas of
fibroglandular density.
FINDINGS: There are no findings suspicious for malignancy.
IMPRESSION: No mammographic evidence of malignancy. A result letter of this
screening mammogram will be mailed directly to the patient.

RECOMMENDATION:
Screening mammogram in one year. (Code:51-O-LD2)

BI-RADS CATEGORY  1: Negative.

## 2022-02-07 ENCOUNTER — Encounter: Payer: MEDICARE | Primary: Geriatric Medicine

## 2022-02-08 ENCOUNTER — Encounter: Payer: Self-pay | Admitting: Family Medicine

## 2022-02-08 ENCOUNTER — Ambulatory Visit (INDEPENDENT_AMBULATORY_CARE_PROVIDER_SITE_OTHER): Payer: No Typology Code available for payment source | Admitting: Family Medicine

## 2022-02-08 VITALS — BP 122/80 | HR 78 | Temp 97.6°F | Ht 67.0 in | Wt 223.2 lb

## 2022-02-08 DIAGNOSIS — Z862 Personal history of diseases of the blood and blood-forming organs and certain disorders involving the immune mechanism: Secondary | ICD-10-CM

## 2022-02-08 DIAGNOSIS — E7800 Pure hypercholesterolemia, unspecified: Secondary | ICD-10-CM

## 2022-02-08 DIAGNOSIS — Z0000 Encounter for general adult medical examination without abnormal findings: Secondary | ICD-10-CM | POA: Diagnosis not present

## 2022-02-08 DIAGNOSIS — E559 Vitamin D deficiency, unspecified: Secondary | ICD-10-CM | POA: Diagnosis not present

## 2022-02-08 DIAGNOSIS — E041 Nontoxic single thyroid nodule: Secondary | ICD-10-CM | POA: Diagnosis not present

## 2022-02-08 DIAGNOSIS — I10 Essential (primary) hypertension: Secondary | ICD-10-CM

## 2022-02-08 DIAGNOSIS — M5441 Lumbago with sciatica, right side: Secondary | ICD-10-CM

## 2022-02-08 DIAGNOSIS — Z87898 Personal history of other specified conditions: Secondary | ICD-10-CM | POA: Diagnosis not present

## 2022-02-08 DIAGNOSIS — Z1231 Encounter for screening mammogram for malignant neoplasm of breast: Secondary | ICD-10-CM | POA: Diagnosis not present

## 2022-02-08 DIAGNOSIS — G8929 Other chronic pain: Secondary | ICD-10-CM | POA: Diagnosis not present

## 2022-02-08 LAB — T4, FREE: Free T4: 0.86 ng/dL (ref 0.60–1.60)

## 2022-02-08 LAB — CBC WITH DIFFERENTIAL/PLATELET
Basophils Absolute: 0 10*3/uL (ref 0.0–0.1)
Basophils Relative: 0.3 % (ref 0.0–3.0)
Eosinophils Absolute: 0.2 10*3/uL (ref 0.0–0.7)
Eosinophils Relative: 2.4 % (ref 0.0–5.0)
HCT: 38.5 % (ref 36.0–46.0)
Hemoglobin: 12.1 g/dL (ref 12.0–15.0)
Lymphocytes Relative: 22.2 % (ref 12.0–46.0)
Lymphs Abs: 1.9 10*3/uL (ref 0.7–4.0)
MCHC: 31.5 g/dL (ref 30.0–36.0)
MCV: 73.2 fl — ABNORMAL LOW (ref 78.0–100.0)
Monocytes Absolute: 0.6 10*3/uL (ref 0.1–1.0)
Monocytes Relative: 6.5 % (ref 3.0–12.0)
Neutro Abs: 5.9 10*3/uL (ref 1.4–7.7)
Neutrophils Relative %: 68.6 % (ref 43.0–77.0)
Platelets: 198 10*3/uL (ref 150.0–400.0)
RBC: 5.26 Mil/uL — ABNORMAL HIGH (ref 3.87–5.11)
RDW: 13.8 % (ref 11.5–15.5)
WBC: 8.6 10*3/uL (ref 4.0–10.5)

## 2022-02-08 LAB — TSH: TSH: 0.82 u[IU]/mL (ref 0.35–5.50)

## 2022-02-08 LAB — LIPID PANEL
Cholesterol: 158 mg/dL (ref 0–200)
HDL: 64.8 mg/dL (ref >39.00)
LDL Cholesterol: 82 mg/dL (ref 0–99)
NonHDL: 93.27
Total CHOL/HDL Ratio: 2
Triglycerides: 57 mg/dL (ref 0.0–149.0)
VLDL: 11.4 mg/dL (ref 0.0–40.0)

## 2022-02-08 LAB — COMPREHENSIVE METABOLIC PANEL
ALT: 9 U/L (ref 0–35)
AST: 20 U/L (ref 0–37)
Albumin: 3.9 g/dL (ref 3.5–5.2)
Alkaline Phosphatase: 93 U/L (ref 39–117)
BUN: 15 mg/dL (ref 6–23)
CO2: 37 mEq/L — ABNORMAL HIGH (ref 19–32)
Calcium: 9.4 mg/dL (ref 8.4–10.5)
Chloride: 101 mEq/L (ref 96–112)
Creatinine, Ser: 1.13 mg/dL (ref 0.40–1.20)
GFR: 46.26 mL/min — ABNORMAL LOW (ref >60.00)
Glucose, Bld: 75 mg/dL (ref 70–99)
Potassium: 3.9 mEq/L (ref 3.5–5.1)
Sodium: 143 mEq/L (ref 135–145)
Total Bilirubin: 0.6 mg/dL (ref 0.2–1.2)
Total Protein: 7 g/dL (ref 6.0–8.3)

## 2022-02-08 LAB — HEMOGLOBIN A1C: Hgb A1c MFr Bld: 6.5 % (ref 4.6–6.5)

## 2022-02-08 LAB — T3, FREE: T3, Free: 3.3 pg/mL (ref 2.3–4.2)

## 2022-02-08 LAB — VITAMIN D 25 HYDROXY (VIT D DEFICIENCY, FRACTURES): VITD: 50.52 ng/mL (ref 30.00–100.00)

## 2022-02-08 MED ORDER — MECLIZINE HCL 25 MG PO TABS: 25.0000 mg | ORAL_TABLET | Freq: Every day | ORAL | 5 refills | Status: DC | PRN

## 2022-02-08 NOTE — Assessment & Plan Note (Signed)
Stable, chronic.  Continue current medication.    

## 2022-02-08 NOTE — Progress Notes (Signed)
Patient ID: Margaret Gordon, female    DOB: 1940/09/16, 81 y.o.   MRN: [id number]  This visit was conducted in person.  BP 122/80 (BP Location: Left Arm, Patient Position: Sitting)   Pulse 78   Temp 97.6 F (36.4 C) (Skin)   Ht 5\' 7"  (1.702 m)   Wt 223 lb 4 oz (101.3 kg)   SpO2 96%   BMI 34.97 kg/m    CC:CPX  Subjective:   HPI: Margaret Gordon is a 81 y.o. female presenting on 02/08/2022 for   annual part 2  The patient presents for  complete physical and review of chronic health problems. He/She also has the following acute concerns today:  The patient saw a LPN or RN for medicare wellness visit.  Prevention and wellness was reviewed in detail. Note reviewed and important notes copied below.  Due for labs evaluation of Cholesterol, CMET, Vit D etc.  Hypertension:   Well controlled on  amlodipine 5 mg p.o. daily and telmisartan hydrochlorothiazide 40/12.5 mg daily. She uses Lasix 20 mg daily as well. BP Readings from Last 3 Encounters:  02/08/22 122/80  01/05/22 122/80  05/10/21 130/78  Using medication without problems or lightheadedness: none Chest pain with exertion:none Edema: none Short of breath:none Average home BPs: Other issues: BP Readings from Last 3 Encounters:  02/08/22 122/80  01/05/22 122/80  05/10/21 130/78    Back injection helped some with leg and hip pain.. but still issues.Marland Kitchen told to start  gabapentin 100 mg at night then increasing after 1 week to BID then TID.    Exercise: 3 days a week. Diet:  moderate  Relevant past medical, surgical, family and social history reviewed and updated as indicated. Interim medical history since our last visit reviewed. Allergies and medications reviewed and updated. Outpatient Medications Prior to Visit  Medication Sig Dispense Refill   amLODipine (NORVASC) 5 MG tablet TAKE 1 TABLET (5 MG TOTAL) BY MOUTH DAILY. 90 tablet 1   atorvastatin (LIPITOR) 10 MG tablet Take 10 mg by mouth daily.     FIBER ADULT  GUMMIES PO Take 1 each by mouth daily.     furosemide (LASIX) 20 MG tablet Take 20 mg by mouth daily.     meclizine (ANTIVERT) 25 MG tablet Take 1 tablet (25 mg total) by mouth 3 (three) times daily as needed for dizziness. 30 tablet 0   Multiple Vitamin (MULTIVITAMIN WITH MINERALS) TABS tablet Take 1 tablet by mouth at bedtime.     pantoprazole (PROTONIX) 40 MG tablet Take 1 tablet (40 mg total) by mouth daily. 30 tablet 5   telmisartan-hydrochlorothiazide (MICARDIS HCT) 40-12.5 MG tablet Take 1 tablet by mouth daily.     traMADol (ULTRAM) 50 MG tablet Take 1 tablet (50 mg total) by mouth every 6 (six) hours as needed. 30 tablet 0   APPLE CIDER VINEGAR PO Take 1 each by mouth every morning.     No facility-administered medications prior to visit.     Per HPI unless specifically indicated in ROS section below Review of Systems  Constitutional:  Negative for fatigue and fever.  HENT:  Negative for congestion.   Eyes:  Negative for pain.  Respiratory:  Negative for cough and shortness of breath.   Cardiovascular:  Negative for chest pain, palpitations and leg swelling.  Gastrointestinal:  Negative for abdominal pain.  Genitourinary:  Negative for dysuria and vaginal bleeding.  Musculoskeletal:  Negative for back pain.  Neurological:  Negative for syncope, light-headedness and  headaches.  Psychiatric/Behavioral:  Negative for dysphoric mood.    Objective:  BP 122/80 (BP Location: Left Arm, Patient Position: Sitting)   Pulse 78   Temp 97.6 F (36.4 C) (Skin)   Ht 5\' 7"  (1.702 m)   Wt 223 lb 4 oz (101.3 kg)   SpO2 96%   BMI 34.97 kg/m   Wt Readings from Last 3 Encounters:  02/08/22 223 lb 4 oz (101.3 kg)  01/05/22 219 lb 8 oz (99.6 kg)  05/10/21 216 lb 1 oz (98 kg)      Physical Exam Constitutional:      General: She is not in acute distress.    Appearance: Normal appearance. She is well-developed. She is not ill-appearing or toxic-appearing.  HENT:     Head: Normocephalic.      Right Ear: Hearing, tympanic membrane, ear canal and external ear normal. Tympanic membrane is not erythematous, retracted or bulging.     Left Ear: Hearing, tympanic membrane, ear canal and external ear normal. Tympanic membrane is not erythematous, retracted or bulging.     Nose: No mucosal edema or rhinorrhea.     Right Sinus: No maxillary sinus tenderness or frontal sinus tenderness.     Left Sinus: No maxillary sinus tenderness or frontal sinus tenderness.     Mouth/Throat:     Pharynx: Uvula midline.  Eyes:     General: Lids are normal. Lids are everted, no foreign bodies appreciated.     Conjunctiva/sclera: Conjunctivae normal.     Pupils: Pupils are equal, round, and reactive to light.  Neck:     Thyroid: No thyroid mass or thyromegaly.     Vascular: No carotid bruit.     Trachea: Trachea normal.  Cardiovascular:     Rate and Rhythm: Normal rate and regular rhythm.     Pulses: Normal pulses.     Heart sounds: Normal heart sounds, S1 normal and S2 normal. No murmur heard.    No friction rub. No gallop.  Pulmonary:     Effort: Pulmonary effort is normal. No tachypnea or respiratory distress.     Breath sounds: Normal breath sounds. No decreased breath sounds, wheezing, rhonchi or rales.  Abdominal:     General: Bowel sounds are normal.     Palpations: Abdomen is soft.     Tenderness: There is no abdominal tenderness.  Musculoskeletal:     Cervical back: Normal range of motion and neck supple.  Skin:    General: Skin is warm and dry.     Findings: No rash.  Neurological:     Mental Status: She is alert.  Psychiatric:        Mood and Affect: Mood is not anxious or depressed.        Speech: Speech normal.        Behavior: Behavior normal. Behavior is cooperative.        Thought Content: Thought content normal.        Judgment: Judgment normal.       Results for orders placed or performed in visit on 05/10/21  Renal Function Panel  Result Value Ref Range   Sodium  142 135 - 145 mEq/L   Potassium 4.2 3.5 - 5.1 mEq/L   Chloride 103 96 - 112 mEq/L   CO2 34 (H) 19 - 32 mEq/L   Albumin 4.0 3.5 - 5.2 g/dL   BUN 16 6 - 23 mg/dL   Creatinine, Ser 0.97 0.40 - 1.20 mg/dL   Glucose, Bld 86 70 -  99 mg/dL   Phosphorus 2.8 2.3 - 4.6 mg/dL   GFR 46.19 (L) >01.22 mL/min   Calcium 9.6 8.4 - 10.5 mg/dL     COVID 19 screen:  No recent travel or known exposure to COVID19 The patient denies respiratory symptoms of COVID 19 at this time. The importance of social distancing was discussed today.   Assessment and Plan   The patient's preventative maintenance and recommended screening tests for an annual wellness exam were reviewed in full today. Brought up to date unless services declined.  Counselled on the importance of diet, exercise, and its role in overall health and mortality. The patient's FH and SH was reviewed, including their home life, tobacco status, and drug and alcohol status.   Vaccines: Consider Shingrix. Uptodate with Td, flu, PNA and COVID x 5 Pap/DVE: not indicated Mammo: 02/23/2021: Scheduled for March 12, 2027 2024 Bone Density: 01/07/2020 normal, repeat in 5 years Colon: Not indicated Smoking Status: Former smoker.. very remote ETOH/ drug use: none/none  Hep C: Done    Problem List Items Addressed This Visit     Chronic bilateral low back pain with right-sided sciatica    Chronic, some improvement status post epidural steroid injection.  Planning start and titrate up of gabapentin 100 mg.      High cholesterol    Due for reevaluation      History of anemia   History of vertigo    She is using meclizine daily even though she has only minimal symptoms when in the car.  She states she felt terrible when she had a spell of BPPV and does not want this to occur again.  We discussed use of this medication and possible sedation side effect.  I have encouraged her to try to use this as needed or at least only use when she is going to be in  the car for longer time that day.      Hypertension    Stable, chronic.  Continue current medication.        Thyroid nodule   Vitamin D deficiency   Other Visit Diagnoses     Routine general medical examination at a health care facility    -  Primary   Encounter for screening mammogram for malignant neoplasm of breast            Kerby Nora, MD

## 2022-02-08 NOTE — Assessment & Plan Note (Signed)
Due for reevaluation 

## 2022-02-08 NOTE — Patient Instructions (Addendum)
Please stop at the lab to have labs drawn.  Consider Shingrix vaccine series.  Try to use meclizine as needed.

## 2022-02-08 NOTE — Assessment & Plan Note (Signed)
She is using meclizine daily even though she has only minimal symptoms when in the car.  She states she felt terrible when she had a spell of BPPV and does not want this to occur again.  We discussed use of this medication and possible sedation side effect.  I have encouraged her to try to use this as needed or at least only use when she is going to be in the car for longer time that day.

## 2022-02-08 NOTE — Assessment & Plan Note (Signed)
Chronic, some improvement status post epidural steroid injection.  Planning start and titrate up of gabapentin 100 mg.

## 2022-02-15 ENCOUNTER — Inpatient Hospital Stay: Payer: MEDICARE | Primary: Geriatric Medicine

## 2022-02-20 DIAGNOSIS — M47816 Spondylosis without myelopathy or radiculopathy, lumbar region: Secondary | ICD-10-CM | POA: Diagnosis not present

## 2022-03-08 DIAGNOSIS — M47816 Spondylosis without myelopathy or radiculopathy, lumbar region: Secondary | ICD-10-CM | POA: Diagnosis not present

## 2022-03-14 ENCOUNTER — Inpatient Hospital Stay: Admit: 2022-03-14 | Payer: MEDICARE | Primary: Geriatric Medicine

## 2022-03-14 DIAGNOSIS — I890 Lymphedema, not elsewhere classified: Secondary | ICD-10-CM

## 2022-03-14 IMAGING — CR DG LUMBAR SPINE 2-3V
3 series · 3 of 3 positions shown · non-contrast
Comparison: 06/22/2020

CLINICAL DATA: Right low back pain

EXAM:
LUMBAR SPINE - 2-3 VIEW

[w l-spine a.p. *]
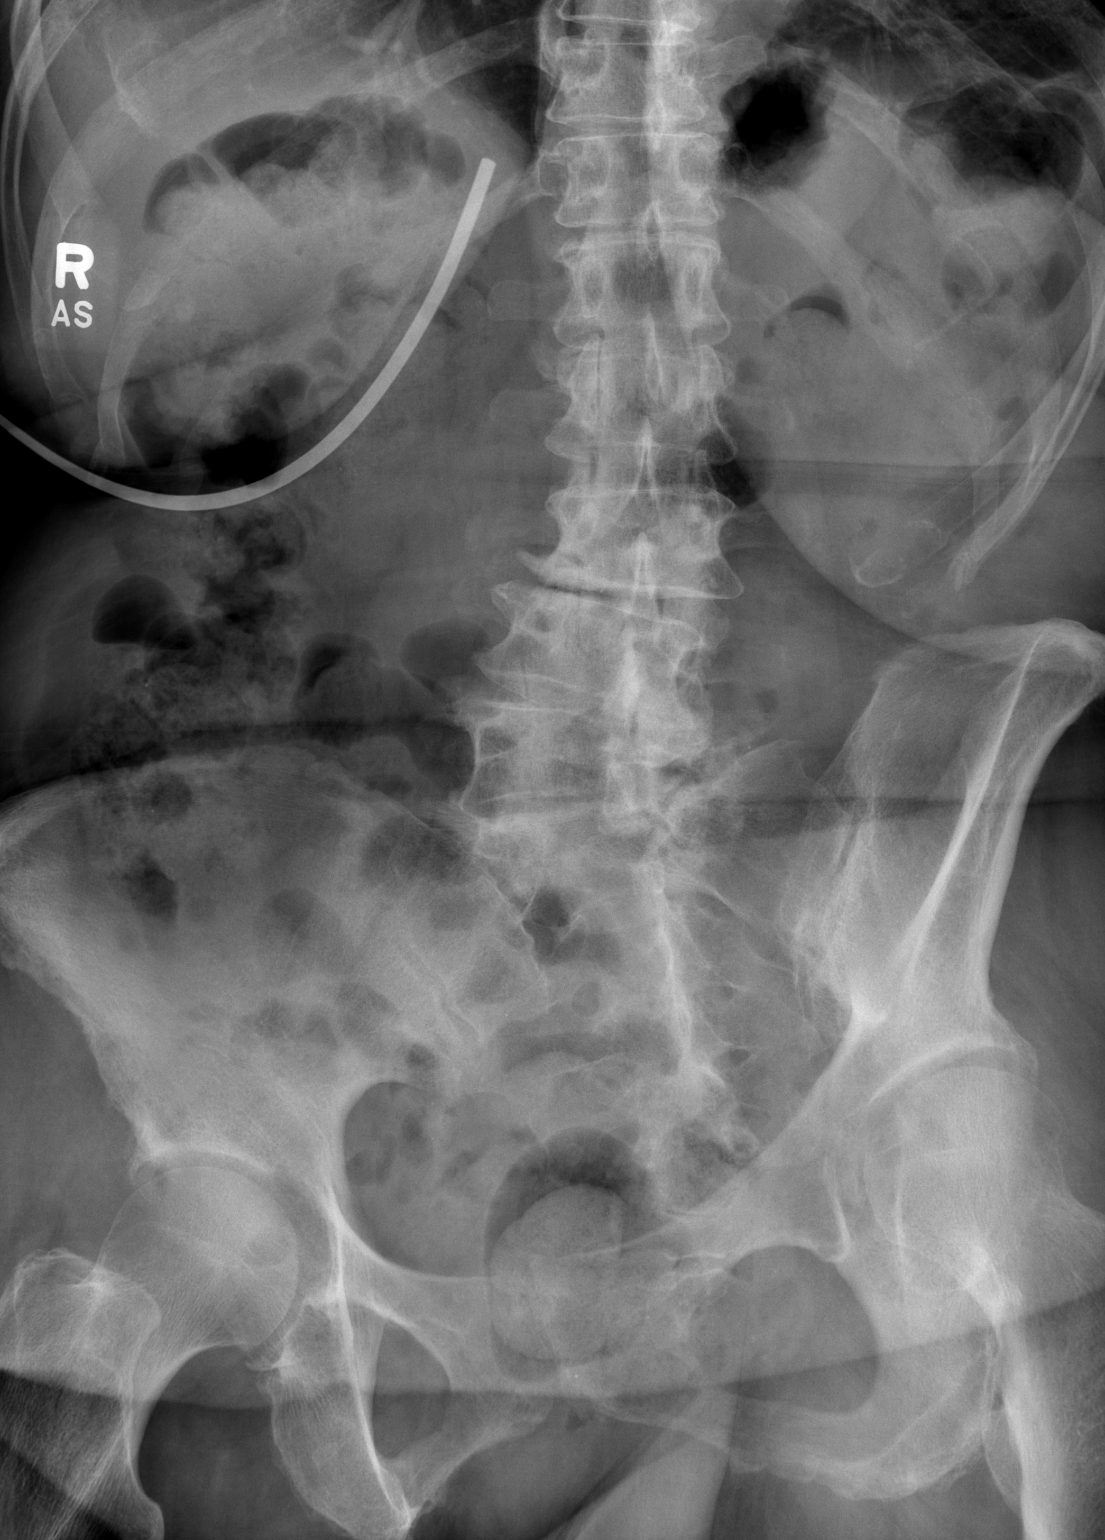

[w l-spine lat * (1 of 2)]
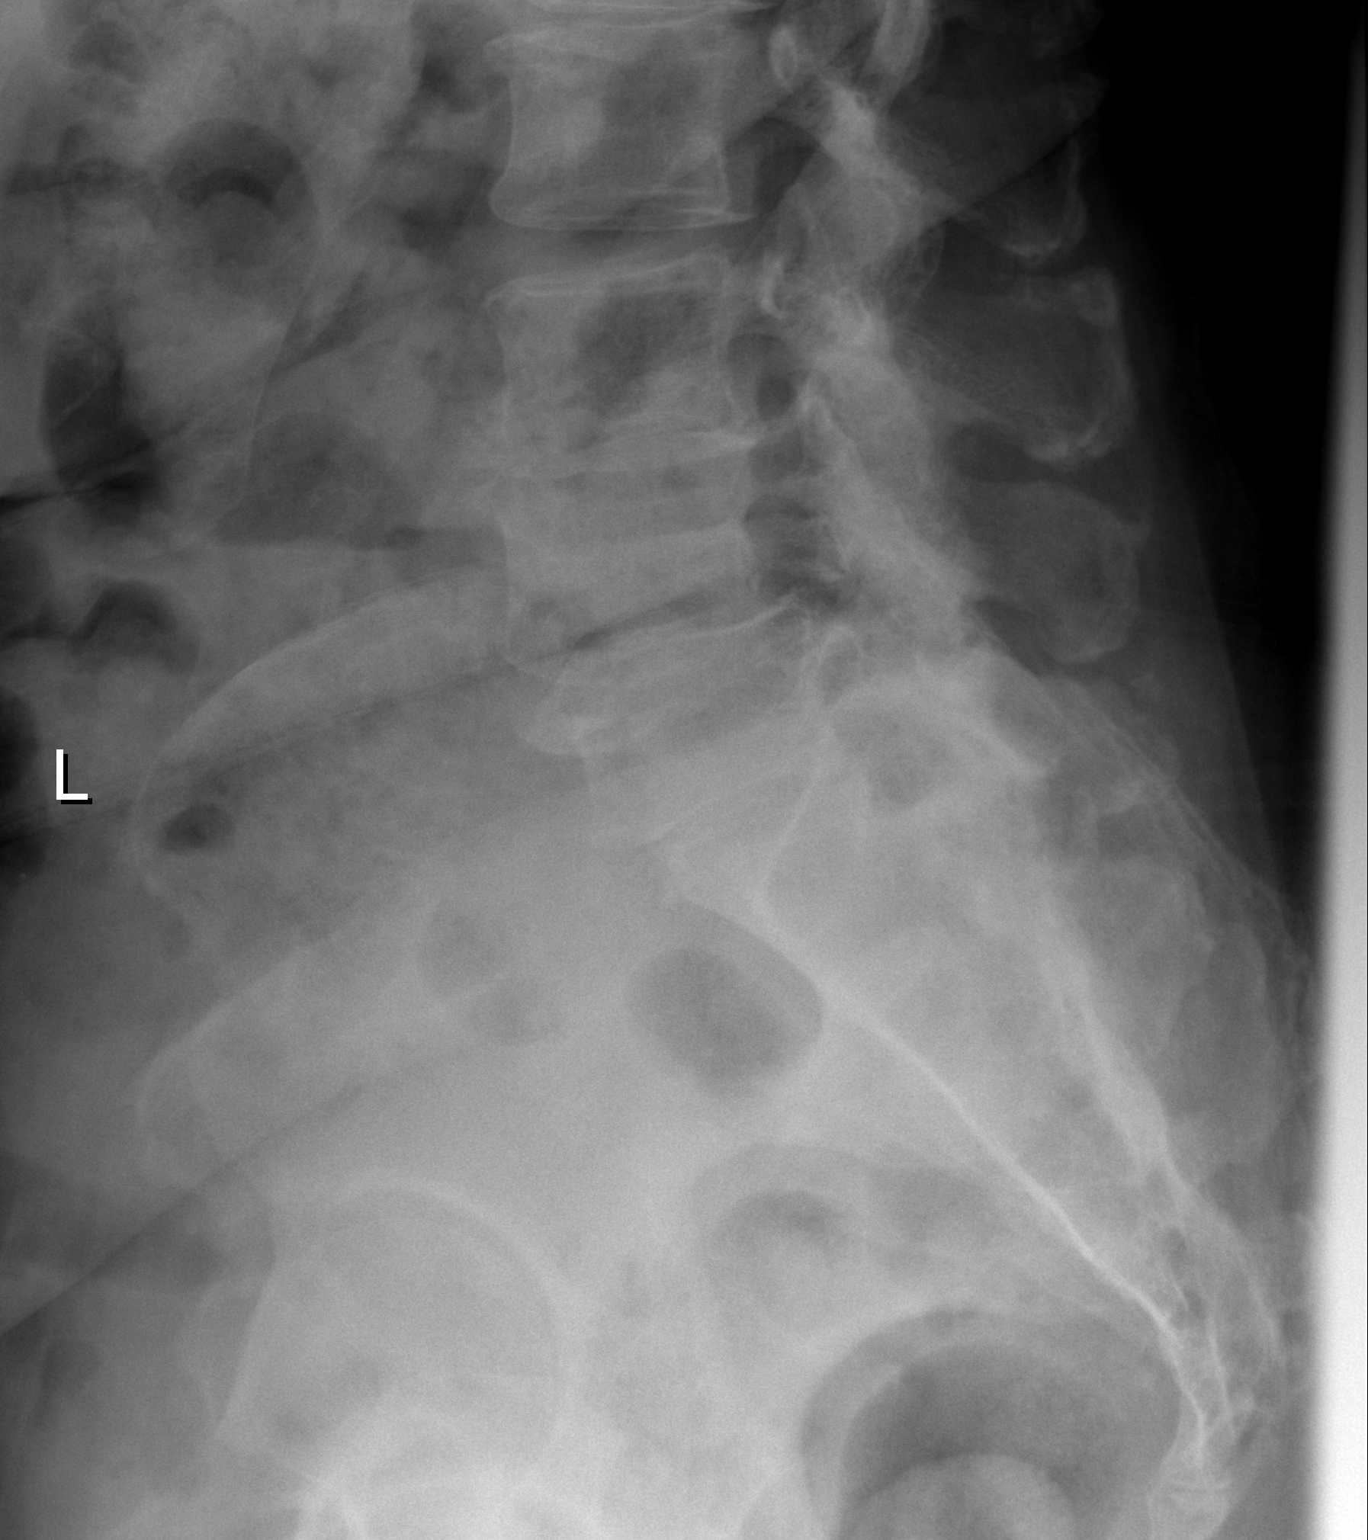

[w l-spine lat * (2 of 2)]
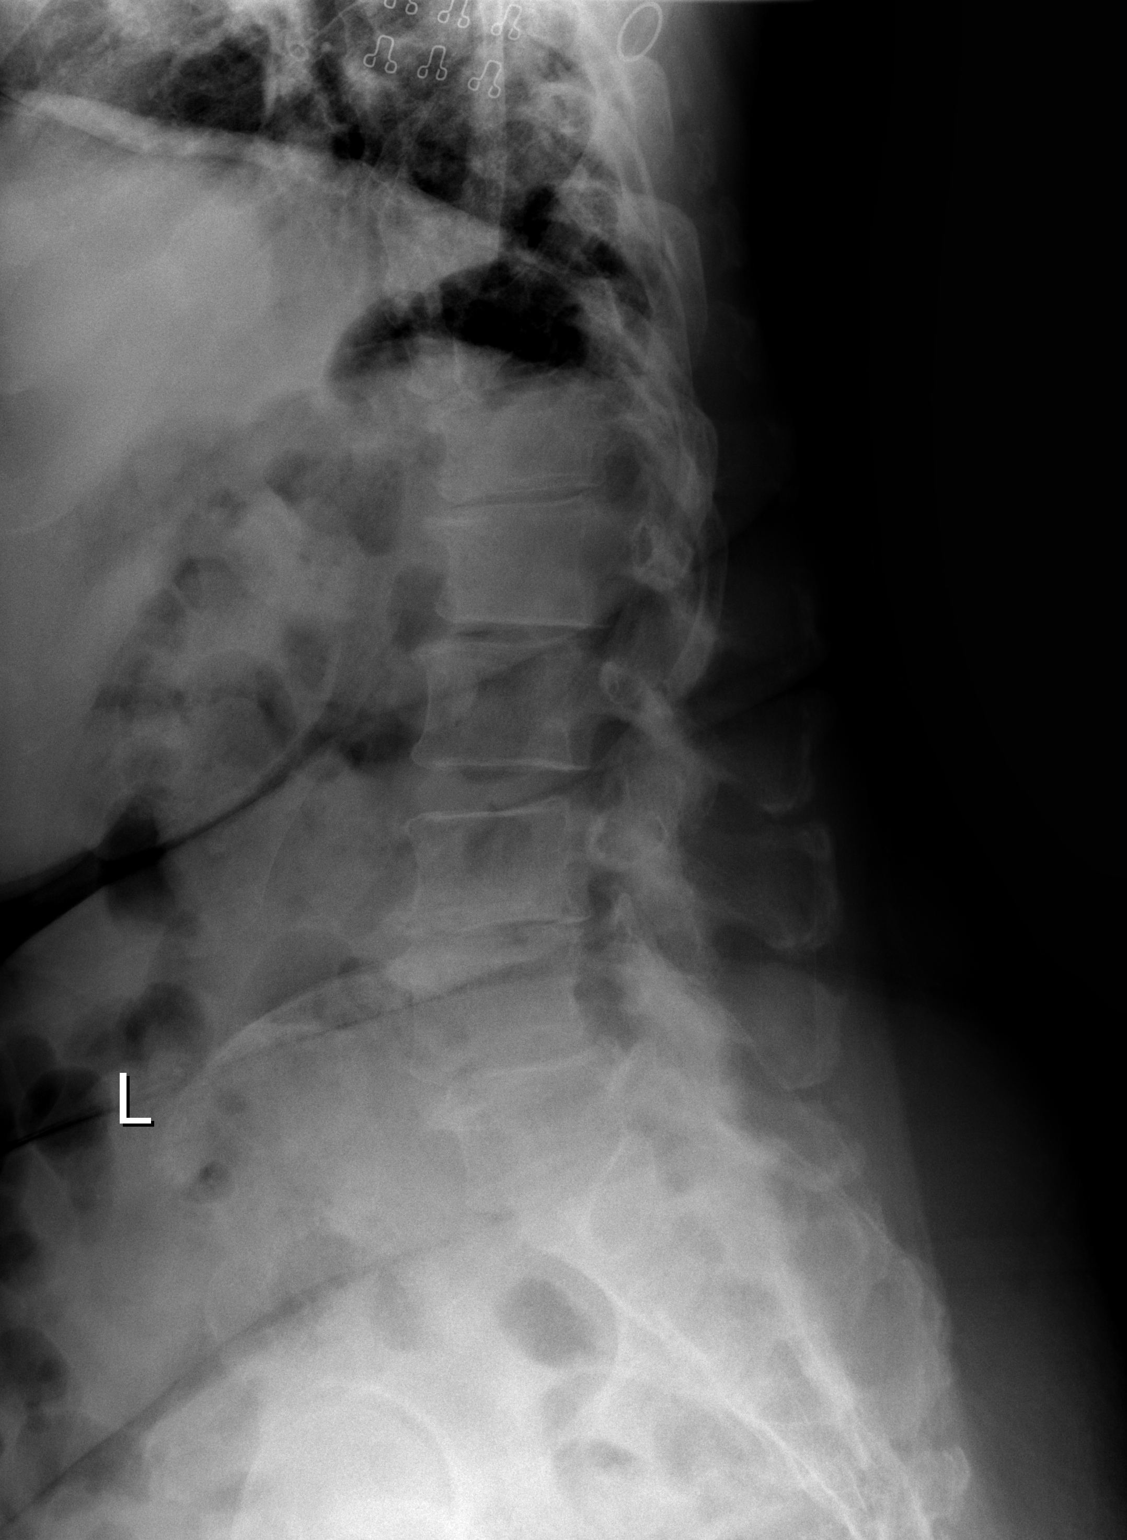

[3 of 3 positions shown; findings below may reference images not displayed]

FINDINGS: Frontal and lateral views of the lumbar spine are obtained. There
are 5 non-rib-bearing lumbar type vertebral bodies identified, with
left convex scoliosis centered at L3-4 again noted. Compensatory
right convex curvature of the lumbosacral junction. There are no
acute fractures. There is grade 1 anterolisthesis of L4 on L5
unchanged. There is severe spondylosis and facet hypertrophy at
L3-4, L4-5, and L5-S1. The sacroiliac joints are unremarkable.
IMPRESSION: 1. Persistent lower lumbar spondylosis, facet hypertrophy, and
scoliosis. No acute fracture.

## 2022-03-14 NOTE — Progress Notes (Addendum)
PHYSICAL THERAPY/OCCUPATIONAL THERAPY - DAILY TREATMENT NOTE (updated 3/23)    Date: 03/14/2022        Patient Name:  Allison Newman DOB:  1940-05-23   Medical   Diagnosis:  Lymphedema, not elsewhere classified [I89.0] Treatment Diagnosis:  I89.0     Lymphedema, not elsewhere classified    Referral Source:  Rosalie Doctor, NP Insurance:   Payor: [employer] MEDICARE / Plan: Francine Graven G[employer]US HMO / Product Type: *No Product type* /                   Patient DOB verified yes     Visit #   Current  / Total 4 10   Time   In / Out 7:40 am  8:55 am   Total Treatment Time 75   Total Timed Codes 75     SUBJECTIVE    Pain Level (0-10 scale): patient reports L knee pain when donning and doffing socks today. Pain not rated on numeric scale.     Any medication changes, allergies to medications, adverse drug reactions, diagnosis change, or new procedure performed?: [x]  No    []  Yes (see summary sheet for update)  Medications: Verified on Patient Summary List    Subjective functional status/changes:     Patient received the compression products that were ordered last visit and brought them to the clinic for fitting today.     OBJECTIVE    Therapeutic Procedures:  Tx Min Billable or 1:1 Min (if diff from Tx Min) Procedure, Rationale, Specifics   10 10 97110 Therapeutic Exercise (timed):  increase ROM, strength, coordination, balance, and proprioception to improve patient's ability to progress to PLOF and address remaining functional goals. (see flow sheet as applicable)    Details if applicable:    Therapeutic Exercise:  [x]  Exercises - patient able to demonstrate the sitting routine X 5 reps today []  Remedial Lymphedema Exercise Program []  Axillary Web Exercise Program   []  Stick Routine     45 45 [id number] Orthotic Management and Training LE (timed): improve positioning of lower extremity during weight bearing and gait, improve pressure distrubution of the plantar aspect of the foot to improve patient's ability to progress to  PLOF and address remaining functional goals.     Details if applicable:   Upper/Lower Extremity Compression: Patient was willing to pay a cash price to obtain one set of compression products for the legs and was measured for those last visit. She brought the compression products to the clinic for fitting today and the initial fit and comfort of the velcro product and compressive undersocks is good. Patient able to demonstrate the ability to don and doff the compression products with set up and minimal assistance. Patient's grandson lives with her and can assist if needed but patient prefers to be independent. Discussed wearing the compression products on the legs each day, alternating legs, since she only has one set. Patient now has insurance coverage of compression products and a second set will be ordered so that she has compression products to wear on the legs bilaterally each day.  Will submit the order to [facility] for processing.      Upper/Lower Extremity Compression: Fitted with the following compression products:     B LE       Style: Velcro     One solaris ready wrap calf unit size XL regular length in color black (second product ordered today)  One pair of Circaid compressive undersocks  size large 15-25 mmHg (second pair of liner socks ordered today)     Vendor: [employer]     Initiated education: in compression garment donning and doffing,  daily wear schedule, precautions, laundering instructions, garment lifespan, return and reordering process (by bringing prior garments into the clinic).        20 20 97535 Self Care/Home Management (timed):  improve patient knowledge and understanding of positioning and skin care and home program  to improve patient's ability to progress to PLOF and address remaining functional goals.  (see flow sheet as applicable)    Details if applicable:      Skin/wound care/debridement:   Reviewed skin care principles:   Low pH lotion - provided skin care with  Eucerin lotion todayt  Application following MLD principles  Signs and symptoms of cellulitis  Prevention of cellulitis  How to care for skin injuries       Positioning          Elevation of the legs for 15 minute periods throughout the day. Avoid sitting for prolonged periods at one time.      Footwear/Clothing with compression bandaging or compression garments     [x]   Footwear recommendations for use with bandaging/ compression garments                  75 75    Total Total       Modalities Rationale:     decrease edema and increase tissue extensibility to improve patient's ability to progress to PLOF and address remaining functional goals.  0   Vasopneumatic Device:   Discussed the role and benefit of a home vaso-pneumatic device. Patient is not interested in pursuing a pump at this time.    Rationale: To improve lymphatic fluid movement and decrease swelling to improve the patient's ability to perform ADL and IADL skills and prevent worsening of swelling over time.    Skin assessment post-treatment (if applicable):    [x]   intact    []   redness- no adverse reaction                 [] redness - adverse reaction:        Patient Education: [x]  Review HEP    [x]   Patient Education billed concurrently with other procedures   [x]  MLD Patient Education Continued education in self MLD technique with bathing and skin care  []  Progressed/Changed HEP based on:   []  positioning   []  Kinesiotape   [x]  Skin care   []  wound care   [x]  other: management of compression products  Patient / caregiver re-demonstrated bandaging. []  Yes  []  No  Compression bandaging/garment precautions reviewed: [x]  Yes  []  No      Other Objective/Functional Measures  Full LE volumes:   R lower extremity 13,334.59 ml today compared to 17,159.79 ml 12/28/21 compared to 15,956.30 ml on evaluation 11/29/21  L lower extremity 14,965.35 ml today compared to 17,889.39 ml 12/28/21 compared to 16,492.50 ml on evaluation 11/29/21  Percentage difference is 12% L  LE > R LE compared to 3.36% on evaluation    Pain Level at end of session (0-10 scale): continual L knee pain from arthritis      Assessment   Patient has now been seen in our clinic for 4 visits. She was evaluated 11/29/21 and had two follow up visits in October. She did not return to the clinic until today due to 3 cancelled visits. Patient was fitted with one  set of compression products today and the initial fit and comfort is good. Patient paid a cash price for one set of compression products and a second set was ordered today for better management of swelling now that she has insurance coverage of garments.  Full LE volumes have improved by 2,600 ml in the R LE and 1,500 ml in the L LE since evaluation but patient admits to now taking diuretics as instructed by physician. 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 Will extend patient's plan of care to allow her to continue with her home program to the best of her ability, receive all ordered compression products, and then return to the clinic for assessment of swelling and to prepare for discharge to the restorative phase of care.   Patient has met all short term goals and long term goal 1 and long term goal 3. will continue to benefit from skilled PT / OT services to address swelling, analyze compression product fit and use, and instruct in home lymphedema management program to address functional deficits and attain remaining goals.    Progress toward goals / Updated goals:  [x]   See Progress Note/Recertification    Short Term Goals: To be accomplished in 5 treatments.  Patient and/or caregiver will verbalize 3/3 signs and symptoms of infection without external cueing, in order to reduce the risk of infection and skin impairment and to promote optimal self management of condition.  Reinforced daily skin care again  today and reviewed signs and symptoms of infection. Goal met 03/14/22  2.   Patient to perform 5/5 lymphedema remedial exercises in session with modified independence utilizing HEP handout, in order to promote optimal independence with management of         condition, as well as promote optimal limb volume reduction required for proper fit of donned clothing in 6 weeks. Continued education in the role and benefit of daily participation in remedial lymphedema exercises. Goal met 03/14/22     Long Term Goals: To be accomplished in 10 treatments.  1.  Patient will demonstrate an improvement in self perceived functional impairment as evidenced by an improved score on the Lymphedema Life Impact Scale from 43% to 39%. Scores a 21% today. Goal met 03/14/22  2.  Patient will demonstrate a decrease in girth measurement of the trunk by 2 cm waist/hips demonstrating a reduction of swelling in the trunk for increased comfort and ease of donning and doffing         clothing.  3.  Patient will demonstrate a loss of volume of the 500 ml in the legs bilaterally to reduce the risk of infection and progression of swelling over time for ease of performing lower body dressing and safe mobility. Goal met 03/14/12  4.   Patient will be measured for and obtain comfortable and optimal fitting compression products to prevent reaccumulation of fluid at discharge which could impair patient's ability to perform         safe mobility and dressing. In progress  5.  Patient will obtain a home vaso-pneumatic device if appropriate and use at least 4 days each week to prevent reaccumulation of fluid for improved tolerance of mobility and ambulation and/or decrease         the feeling of limb heaviness with bathing and dressing during the restorative phase of care.  Discontinue - patient not interested in pursuing a pump at this time.    PLAN  Yes extend plan of care  Re-Cert Due: 0/11/2328  []   Upgrade activities as tolerated  []   Discharge due to :  []    Other:      Johna Sheriff, PT, CLT       03/14/2022       9:04 AM

## 2022-03-14 NOTE — Other (Signed)
[facility]  a part of [facility]   [address], [place] [address]  Phone: [phone]  Fax: [phone]    CONTINUED PLAN OF CARE/RECERTIFICATION FOR PHYSICAL THERAPY or OCCUPATIONAL THERAPY        Patient Name:              Allison Newman DOB:  October 31, 1940   Treatment/Medical Diagnosis:  Lymphedema, not elsewhere classified [I89.0]   Onset Date:  11/04/20    Referral Source:  Deatra Ina, NP Start of Care [facility]):  3   Prior Hospitalization:  See Medical History Provider #:  [id number]      Prior Level of Function (PLOF):  Modified independent for bathing and dressing. Tends to sponge bathe but will bathe in a walk in shower with shower chair on occasion.    Comorbidities:  See evaluation   Medications:  Verified on Patient Summary List   Visits from [facility]:  4 Missed Visits:  3     Progress toward Goals:    Short Term Goals: To be accomplished in 5 treatments.  Patient and/or caregiver will verbalize 3/3 signs and symptoms of infection without external cueing, in order to reduce the risk of infection and skin impairment and to promote optimal self management of condition.  Reinforced daily skin care again today and reviewed signs and symptoms of infection. Goal met 03/14/22  2.   Patient to perform 5/5 lymphedema remedial exercises in session with modified independence utilizing HEP handout, in order to promote optimal independence with management of         condition, as well as promote optimal limb volume reduction required for proper fit of donned clothing in 6 weeks. Continued education in the role and benefit of daily participation in remedial lymphedema exercises. Goal met 03/14/22     Long Term Goals: To be accomplished in 10 treatments.  1.  Patient will demonstrate an improvement in self perceived functional impairment as evidenced by an improved score on the Lymphedema Life Impact Scale from 43% to 39%. Scores a 21% today. Goal met 03/14/22  2.  Patient will  demonstrate a decrease in girth measurement of the trunk by 2 cm waist/hips demonstrating a reduction of swelling in the trunk for increased comfort and ease of donning and doffing         clothing.  3.  Patient will demonstrate a loss of volume of the 500 ml in the legs bilaterally to reduce the risk of infection and progression of swelling over time for ease of performing lower body dressing and safe mobility. Goal met 03/14/12  4.   Patient will be measured for and obtain comfortable and optimal fitting compression products to prevent reaccumulation of fluid at discharge which could impair patient's ability to perform         safe mobility and dressing. In progress  5.  Patient will obtain a home vaso-pneumatic device if appropriate and use at least 4 days each week to prevent reaccumulation of fluid for improved tolerance of mobility and ambulation and/or decrease         the feeling of limb heaviness with bathing and dressing during the restorative phase of care. Discontinue - patient not interested in pursuing a pump at this time.    Key Functional Changes/Progress: easier mobility with less swelling in the legs  Problem List: increased risk of progression of swelling and infection   Treatment Plan may include any  combination of the following: [address] Therapeutic Exercise, 97140 Manual Therapy, 97535 Self Care/Home Management, 97016 Vasopneumatic Device  (Vasopnuematic compression justification:  Per bilateral girth measures taken and listed above the edema is considered significant and having an impact on the patient's balance and gait), and 97760 Orthotic Management and Training    Patient Goal(s) has been updated and includes:   1.  Patient will demonstrate a decrease in girth measurement of the trunk by 2 cm waist/hips demonstrating a reduction of swelling in the trunk for increased comfort and ease of donning and doffing         clothing.  2.   Patient will be measured for and obtain comfortable and optimal  fitting compression products to prevent reaccumulation of fluid at discharge which could impair patient's ability to perform         safe mobility and dressing.   3.   Patient will demonstrate independence with donning and doffing LE compression products to promote optimal independence with management of condition, as well as promote limb volume reduction required for proper fit of    donned clothing.     Goals for this certification period include and are to be achieved in   6  treatments:    Frequency / Duration:   Patient to be seen   1   times per month for   6  treatments:    Assessments/Recommendations for Continuation of Care: Patient has now been seen in our clinic for 4 visits. She was evaluated 11/29/21 and had two follow up visits in October. She did not return to the clinic until today due to 3 cancelled visits. Patient was fitted with one set of compression products today and the initial fit and comfort is good. Patient paid a cash price for one set of compression products and a second set was ordered today for better management of swelling now that she has insurance coverage of garments.  Full LE volumes have improved by 2,600 ml in the R LE and 1,500 ml in the L LE since evaluation but patient admits to now taking diuretics as instructed by physician. 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 Will extend patient's plan of care to allow her to continue with her home program to the best of her ability, receive all ordered compression products, and then return to the clinic for assessment of swelling and to prepare for discharge to the restorative phase of care.   Patient has met all short term goals and long term goal 1 and long term goal 3. will continue to benefit from skilled PT / OT services to address swelling, analyze compression product fit and use,  and instruct in home lymphedema management program to address functional deficits and attain remaining goals.    If you have any questions/comments please contact us directly at [phone].   Thank you for allowing [place] to assist in the care of your patient.    Certification Period: 03/14/2022-06/09/2022    Dayton Bailiff, PT, CLT       03/14/2022       2:29 PM      ___ I have read the above report and request that my patient continue as recommended.   ___ I have read the above report and request that my patient continue therapy with the following changes/special instructions: ________________________________________________  ___ I have read the above report and request that my patient be discharged from therapy.     17 Signature:_________________________   DATE:_________   TIME:________                           Deatra Ina, NP    ** Signature, Date and Time must be completed for valid certification **  Please sign and fax to [phone].  Thank you

## 2022-03-28 DIAGNOSIS — M47816 Spondylosis without myelopathy or radiculopathy, lumbar region: Secondary | ICD-10-CM | POA: Diagnosis not present

## 2022-04-03 ENCOUNTER — Ambulatory Visit
Admission: RE | Admit: 2022-04-03 | Discharge: 2022-04-03 | Disposition: A | Discharge status: Home / Self Care | Payer: No Typology Code available for payment source | Source: Ambulatory Visit | Attending: Family Medicine | Admitting: Family Medicine

## 2022-04-03 DIAGNOSIS — Z1231 Encounter for screening mammogram for malignant neoplasm of breast: Secondary | ICD-10-CM | POA: Diagnosis not present

## 2022-04-09 ENCOUNTER — Other Ambulatory Visit: Payer: Self-pay | Admitting: Family Medicine

## 2022-04-09 MED ORDER — TELMISARTAN-HCTZ 40-12.5 MG PO TABS
1.0000 | ORAL_TABLET | Freq: Every day | ORAL | 1 refills | Status: DC
  Filled 2022-04-09: qty 90, 90d supply, fill #0

## 2022-04-09 MED ORDER — ATORVASTATIN CALCIUM 10 MG PO TABS
10.0000 mg | ORAL_TABLET | Freq: Every day | ORAL | 3 refills | Status: DC
  Filled 2022-04-09: qty 90, 90d supply, fill #0

## 2022-04-10 ENCOUNTER — Other Ambulatory Visit: Payer: Self-pay

## 2022-04-14 ENCOUNTER — Other Ambulatory Visit: Payer: Self-pay

## 2022-04-24 ENCOUNTER — Encounter: Payer: Self-pay | Admitting: Family Medicine

## 2022-04-24 MED ORDER — ATORVASTATIN CALCIUM 10 MG PO TABS: 10.0000 mg | ORAL_TABLET | Freq: Every day | ORAL | 3 refills | Status: DC

## 2022-04-24 MED ORDER — TELMISARTAN-HCTZ 40-12.5 MG PO TABS: 1.0000 | ORAL_TABLET | Freq: Every day | ORAL | 1 refills | Status: DC

## 2022-04-26 ENCOUNTER — Encounter: Payer: MEDICARE | Primary: Geriatric Medicine

## 2022-04-30 ENCOUNTER — Other Ambulatory Visit: Payer: Self-pay | Admitting: Family Medicine

## 2022-05-01 ENCOUNTER — Other Ambulatory Visit: Payer: Self-pay

## 2022-05-01 MED ORDER — FUROSEMIDE 20 MG PO TABS
20.0000 mg | ORAL_TABLET | Freq: Every day | ORAL | 3 refills | Status: DC
  Filled 2022-05-01 – 2022-06-10 (×2): qty 30, 30d supply, fill #0

## 2022-05-07 ENCOUNTER — Encounter: Payer: Self-pay | Admitting: Family Medicine

## 2022-05-09 ENCOUNTER — Other Ambulatory Visit: Payer: Self-pay | Admitting: Family Medicine

## 2022-05-16 ENCOUNTER — Encounter: Payer: Self-pay | Admitting: Family Medicine

## 2022-05-17 ENCOUNTER — Inpatient Hospital Stay: Admit: 2022-05-17 | Payer: MEDICARE | Primary: Geriatric Medicine

## 2022-05-17 DIAGNOSIS — I890 Lymphedema, not elsewhere classified: Secondary | ICD-10-CM

## 2022-05-17 NOTE — Progress Notes (Signed)
PHYSICAL THERAPY/OCCUPATIONAL THERAPY - MEDICARE DAILY TREATMENT NOTE (updated 3/23)    Date: 05/17/2022        Patient Name:  Allison Newman DOB:  04/06/1940   Medical   Diagnosis:  Lymphedema, not elsewhere classified [I89.0] Treatment Diagnosis:  I89.0     Lymphedema, not elsewhere classified    Referral Source:  Deatra Ina, NP Insurance:   Payor: [employer] MEDICARE / Plan: [employer] G[employer]US HMO / Product Type: *No Product type* /                   Patient DOB verified yes     Visit #   Current  / Total 5 10   Time   In / Out 8:15 am 9:15 am   Total Treatment Time 60   Total Timed Codes 60   1:1 Treatment Time 60      [facility] Totals Reminder:  bill using total billable   min of TIMED therapeutic procedures and modalities.   8-22 min = 1 unit; 23-37 min = 2 units; 38-52 min = 3 units;  53-67 min = 4 units; 68-82 min = 5 units     SUBJECTIVE    Pain Level (0-10 scale): Patient reports chronic pain in the knees with mobility; repositioned on mat table for better comfort.     Any medication changes, allergies to medications, adverse drug reactions, diagnosis change, or new procedure performed?: [x]  No    []  Yes (see summary sheet for update) Patient unable to recall exact adjustments to her medications at this time.    Medications: Verified on Patient Summary List    Subjective functional status/changes:   Patient reports that her insurance plan would not cover the compression products ordered last visit. She would have to pay a portion of the garment order. Patient reports that swelling in the legs has improved and mobility is much easier ever since having adjustments to her diuretics.     OBJECTIVE    Therapeutic Procedures:  Tx Min Billable or 1:1 Min (if diff from [employer]) Procedure, Rationale, Specifics     [phone] Orthotic Management and Training LE (timed): improve positioning of lower extremity during weight bearing and gait, improve pressure distrubution of the plantar aspect of the foot to improve patient's  ability to progress to PLOF and address remaining functional goals.     Details if applicable:   Upper/Lower Extremity Compression:  Patient paid a cash price to obtain the following:       Style: Velcro     One solaris ready wrap calf unit size XL regular length in color black - would benefit from obtaining a second velcro product to have compression products for both legs. Submitted an order to [place] but patient's insurance will not fully cover the compression products and she does not wish to pay a portion of the products at this time.   One pair of Circaid compressive undersocks size large 15-25 mmHg      Vendor: [employer] and additional vendors     Initiated education: in compression garment donning and doffing,  daily wear schedule, precautions, laundering instructions, garment lifespan, return and reordering process (by bringing prior garments into the clinic).      30 30 [id number] Manual Therapy (timed):  decrease pain, increase tissue extensibility, and decrease edema to improve patient's ability to progress to PLOF and address remaining functional goals.  The manual therapy interventions were performed at a separate and  distinct time from the therapeutic activities interventions . (see flow sheet as applicable)    Details if applicable:    Manual Lymphatic Drainage (MLD):  Area to decongest: B LE and trunk   Sequence used and effectiveness: Secondary sequence for lower extremities with trunk involvement. Cervical techniques deferred due to age and co-morbidities. Continued education in self MLD and provided patient with the written instructions to follow. She is making an effort to perform self MLD at home but has difficulty reaching lower legs.       30 30 [id number] Self Care/Home Management (timed):  improve patient knowledge and understanding of positioning and skin care and home program  to improve patient's ability to progress to PLOF and address remaining functional goals.  (see  flow sheet as applicable)    Details if applicable:      Skin/wound care/debridement:    Reviewed skin care principles: The skin on the legs is intact but dry and flaky on the feet. Patient reports performing skin care 5 times each day. Discussed techniques for applying lotion on the feet.   Low pH lotion - provided skin care with Eucerin lotion today  Application following MLD principles      Positioning              Elevation of the legs for 15 minute periods throughout the day. Avoid sitting for prolonged periods at one time.      Compression product management       Patient self purchased one Solaris Ready Wrap calf unit and pair of compressive liner socks. She admits to not wearing compression products, alternating legs, very often since she struggles with donning and doffing the products and prefers not to ask for family assistance. Continued education in donning and doffing technique today with patient able to verbalizing understanding of technique. She is reluctant to participate in garment management today due to arthritic pain in the hands and knees. Continued education in the role and benefit of daily use of compression products and encouraged patient to have assistance from her grandson as needed.           60 60    Total Total     Modalities Rationale:     decrease edema, decrease pain, and increase tissue extensibility to improve patient's ability to progress to PLOF and address remaining functional goals.  0   Vasopneumatic Device:   Discussed the role and benefit of a home vaso-pneumatic device and patient voiced interest in having a pump trial next visit. Will reach out to the vendor, [employer], to check insurance coverage. Patient would do best in a basic pump since she prefers to be independent with pump management and struggles with self care.    Rationale: To improve lymphatic fluid movement and decrease swelling to improve the patient's ability to perform ADL and IADL skills and prevent  worsening of swelling over time.    Patient Education: [x]  Review HEP    [x]   Patient Education billed concurrently with other procedures   [x]  MLD Patient Education Reviewed with patient, as well as demonstration and instruction during MLD portion of session and Continued education in self MLD technique with bathing and skin care  []  Progressed/Changed HEP based on:   [x]  positioning   []  Kinesiotape   [x]  Skin care   []  wound care   [x]  other: management of compression products  Patient / caregiver re-demonstrated bandaging. []  Yes  []  No  Compression bandaging/garment precautions reviewed: [x]   Yes  []  No      Other Objective/Functional Measures    Height:  5'3"  Weight:  260.0 lb     Full LE volumes:   R lower extremity 11,938.85 ml today compared to 13,334.59 ml 03/14/22 compared to 17,159.79 ml 12/28/21 compared to 15,956.30 ml on evaluation 11/29/21  L lower extremity 13,504.01 ml today compared to 14,965.35 ml 03/14/22 compared to 17,889.39 ml 12/28/21 compared to 16,492.50 ml on evaluation 11/29/21  Percentage difference is 13% L LE > R LE compared to 3% on evaluation    Pain Level at end of session (0-10 scale): knee and hand arthritic pain     Assessment   Patient has not been seen in the clinic since January due to a cancelled visit. Patient has struggled with her home program and only has one set of compression products to alternate wearing on the legs. She has not been compliant with daily use of compression products due to difficulty managing the product and not wanting to rely on caregiver assistance. Patient does not wish to obtain a second compression product at this time due to financial limitations. She is interested in obtaining a basic vaso-pneumatic device in hopes of having a tool at home that she can manage herself for optimum management of swelling. Will attempt to schedule a pump trial for next visit. Continued education in home program and garment management and patient will make an effort to  participate in her home program to the best of her ability.   Patient will continue to benefit from skilled PT / OT services to address ROM deficits, address swelling, analyze compression product fit and use, instruct in home lymphedema management program, and measure for compression products to address functional deficits and attain remaining goals.    Progress toward goals / Updated goals:  [x]   See Progress Note/Recertification    Short Term Goals: To be accomplished in 5 treatments.  Patient and/or caregiver will verbalize 3/3 signs and symptoms of infection without external cueing, in order to reduce the risk of infection and skin impairment and to promote optimal self management of condition.  Reinforced daily skin care again today and reviewed signs and symptoms of infection. Goal met 03/14/22  2.   Patient to perform 5/5 lymphedema remedial exercises in session with modified independence utilizing HEP handout, in order to promote optimal independence with management of         condition, as well as promote optimal limb volume reduction required for proper fit of donned clothing in 6 weeks. Continued education in the role and benefit of daily participation in remedial lymphedema exercises. Goal met 03/14/22     Long Term Goals: To be accomplished in 10 treatments.  1.  Patient will demonstrate an improvement in self perceived functional impairment as evidenced by an improved score on the Lymphedema Life Impact Scale from 43% to 39%. Scores a 21% today. Goal met 03/14/22  2.  Patient will demonstrate a decrease in girth measurement of the trunk by 2 cm waist/hips demonstrating a reduction of swelling in the trunk for increased comfort and ease of donning and doffing         clothing. In progress  3.  Patient will demonstrate a loss of volume of the 500 ml in the legs bilaterally to reduce the risk of infection and progression of swelling over time for ease of performing lower body dressing and safe mobility. Goal  met 03/14/12  4.   Patient will be measured  for and obtain comfortable and optimal fitting compression products to prevent reaccumulation of fluid at discharge which could impair patient's ability to perform         safe mobility and dressing. In progress  5.  Patient will obtain a home vaso-pneumatic device if appropriate and use at least 4 days each week to prevent reaccumulation of fluid for improved tolerance of mobility and ambulation and/or decrease         the feeling of limb heaviness with bathing and dressing during the restorative phase of care. Will schedule a pump trial.     PLAN  Yes  Continue plan of care  Re-Cert Due: [id number]  []   Upgrade activities as tolerated  []   Discharge due to :  []   Other:    Johna Sheriff, PT, CLT      05/17/2022       8:07 AM

## 2022-05-17 NOTE — Progress Notes (Signed)
[facility]  a part of [facility]   [address], [place] [address]  Phone: [phone]  Fax: [phone]    PHYSICAL THERAPY/OCCUPATIONAL THERAPY PROGRESS NOTE  Patient Name:  Allison Newman DOB:  1940-11-25   Treatment/Medical Diagnosis: Lymphedema, not elsewhere classified [I89.0]   Referral Source:  Deatra Ina, NP     Date of Initial Visit:  11/29/21 Attended Visits:  5 Missed Visits:  4     SUMMARY OF TREATMENT/ASSESSMENT:  Patient has not been seen in the clinic since January due to a cancelled visit. Patient has struggled with her home program and only has one set of compression products to alternate wearing on the legs. She has not been compliant with daily use of compression products due to difficulty managing the product and not wanting to rely on caregiver assistance. Patient does not wish to obtain a second compression product at this time due to financial limitations. She is interested in obtaining a basic vaso-pneumatic device in hopes of having a tool at home that she can manage herself for optimum management of swelling. Will attempt to schedule a pump trial for next visit. Continued education in home program and garment management and patient will make an effort to participate in her home program to the best of her ability.   Patient will continue to benefit from skilled PT / OT services to address ROM deficits, address swelling, analyze compression product fit and use, instruct in home lymphedema management program, and measure for compression products to address functional deficits and attain remaining goals.    CURRENT STATUS/GOALS  Short Term Goals: To be accomplished in 5 treatments.  Patient and/or caregiver will verbalize 3/3 signs and symptoms of infection without external cueing, in order to reduce the risk of infection and skin impairment and to promote optimal self management of condition.  Reinforced daily skin care again today and  reviewed signs and symptoms of infection. Goal met 03/14/22  2.   Patient to perform 5/5 lymphedema remedial exercises in session with modified independence utilizing HEP handout, in order to promote optimal independence with management of         condition, as well as promote optimal limb volume reduction required for proper fit of donned clothing in 6 weeks. Continued education in the role and benefit of daily participation in remedial lymphedema exercises. Goal met 03/14/22     Long Term Goals: To be accomplished in 10 treatments.  1.  Patient will demonstrate an improvement in self perceived functional impairment as evidenced by an improved score on the Lymphedema Life Impact Scale from 43% to 39%. Scores a 21% today. Goal met 03/14/22  2.  Patient will demonstrate a decrease in girth measurement of the trunk by 2 cm waist/hips demonstrating a reduction of swelling in the trunk for increased comfort and ease of donning and doffing         clothing. In progress  3.  Patient will demonstrate a loss of volume of the 500 ml in the legs bilaterally to reduce the risk of infection and progression of swelling over time for ease of performing lower body dressing and safe mobility. Goal met 03/14/12  4.   Patient will be measured for and obtain comfortable and optimal fitting compression products to prevent reaccumulation of fluid at discharge which could impair patient's ability to perform         safe mobility and dressing. In progress  5.  Patient will obtain a home vaso-pneumatic device if appropriate and use at least 4 days each week to prevent reaccumulation of fluid for improved tolerance of mobility and ambulation and/or decrease         the feeling of limb heaviness with bathing and dressing during the restorative phase of care. Will schedule a pump trial.       RECOMMENDATIONS FOR SKILLED THERAPY  Pump trial     Johna Sheriff, PT, CLT       05/17/2022       1:16 PM    If you have any questions/comments please  contact us directly at [phone].   Thank you for allowing [place] to assist in the care of your patient.

## 2022-05-19 ENCOUNTER — Other Ambulatory Visit: Payer: Self-pay

## 2022-05-22 DIAGNOSIS — M47816 Spondylosis without myelopathy or radiculopathy, lumbar region: Secondary | ICD-10-CM | POA: Diagnosis not present

## 2022-05-30 ENCOUNTER — Emergency Department: Admit: 2022-05-31 | Payer: MEDICARE | Primary: Geriatric Medicine

## 2022-05-30 DIAGNOSIS — R0789 Other chest pain: Principal | ICD-10-CM

## 2022-05-30 DIAGNOSIS — K831 Obstruction of bile duct: Secondary | ICD-10-CM

## 2022-05-30 DIAGNOSIS — K838 Other specified diseases of biliary tract: Secondary | ICD-10-CM

## 2022-05-30 DIAGNOSIS — R079 Chest pain, unspecified: Secondary | ICD-10-CM

## 2022-05-30 NOTE — ED Provider Notes (Signed)
82 year old female presenting due to upper abdominal pain and chest pain.  She was signed out to me by Dr. Tonye Royalty pending results of her troponin and CT scan.  Please see previous notes for details.    Troponin not elevated.  Does have some mild hyperbilirubinemia but no acute abnormalities on CT scan.  On reassessment she is asymptomatic.  She says she believes she has been having a lot of gas.  Due to her hyperbilirubinemia and likely GI symptoms I have given her information to follow-up with gastroenterology.  She understands the plan and was discharged in stable condition     Jodi Marble, MD  05/31/22 [phone]

## 2022-05-30 NOTE — ED Provider Notes (Shared)
[facility] EMERGENCY DEP  EMERGENCY DEPARTMENT ENCOUNTER      Pt Name: Allison Newman  MRN: [id number]  Birthdate 06-Jan-1941  Date of evaluation: 05/30/2022  Provider: Ellsworth Lennox, DO    CHIEF COMPLAINT       Chief Complaint   Patient presents with    Chest Pain         HISTORY OF PRESENT ILLNESS    HPI    Allison Newman is a 82 y.o. female with a past medical history of hypertension, hyperlipidemia, GERD, diabetes, COPD who presents to the emergency department for evaluation of chest pain.  Patient reporting for the last 2 days she has been experiencing central chest discomfort described as "gas."  Notes pain worsens with movement.  Denies any radiation of pain.  No associated nausea or vomiting.  Does endorse chronic shortness of breath which is at baseline due to her COPD.  No other recent illness.  Does endorse a poor taste in her mouth over the last several months and an unintentional weight loss over the last 1 month.  No abdominal pain.    Nursing Notes were reviewed.    REVIEW OF SYSTEMS       Review of Systems   Constitutional:  Positive for unexpected weight change. Negative for chills and fever.   HENT:  Negative for congestion and rhinorrhea.    Eyes:  Negative for discharge and redness.   Respiratory:  Positive for shortness of breath. Negative for cough.    Cardiovascular:  Positive for chest pain.   Gastrointestinal:  Negative for abdominal pain, diarrhea, nausea and vomiting.   Neurological:  Negative for speech difficulty.   Psychiatric/Behavioral:  Negative for agitation.            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/19/2016    Diabetes (HCC)     Elevated BUN 09/13/2011    Finger lesion 03/02/2016    Gallbladder polyp 04/08/2013    Gastrointestinal disorder     GERD    GERD (gastroesophageal reflux disease)     Hypercholesterolemia     Hypertension     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    Microalbuminuria 08/29/2013    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, adult (HCC) 08/28/2011    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    On aspirin at home 05/03/2015    Other unknown and unspecified cause of morbidity or mortality     hx of bronchitis    Rash, skin 09/25/2012    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) 03/02/2016    Slow transit constipation 08/13/2014    Umbilical hernia 05/08/2013         SURGICAL HISTORY       Past Surgical History:   Procedure Laterality Date    COLONOSCOPY N/A 04/23/2018    COLONOSCOPY AND ESOPHAGOGASTRODUODENOSCOPY (EGD) performed by Erling Conte., MD at [facility] ENDOSCOPY    ORTHOPEDIC SURGERY  2000  screw in foot left    ORTHOPEDIC SURGERY  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    OTHER SURGICAL HISTORY      colonoscopy    TUBAL LIGATION           CURRENT MEDICATIONS       Previous Medications    ASPIRIN 81 MG EC TABLET    Take 81 mg by mouth daily    CALCIUM CARB-CHOLECALCIFEROL 600-10 MG-MCG TABS PER TAB    take 1 tablet by mouth twice a day    FLUTICASONE FUROATE-VILANTEROL (BREO ELLIPTA) 200-25 MCG/ACT AEPB INHALER    INHALE 1 PUFF BY MOUTH DAILY    FUROSEMIDE (LASIX) 40 MG TABLET    Take 40 mg by mouth daily    LANCETS MISC    Use BID. Dx: E11.9    LISINOPRIL (PRINIVIL;ZESTRIL) 10 MG TABLET    TAKE 1 TABLET EVERY DAY    METFORMIN (GLUCOPHAGE) 1000 MG TABLET    TAKE 1 TABLET TWICE DAILY WITH MEALS    METHIMAZOLE (TAPAZOLE) 5 MG TABLET    TAKE 1 TABLET EVERY DAY    METOPROLOL TARTRATE (LOPRESSOR) 50 MG TABLET    TAKE 1 TABLET TWICE DAILY    NYSTATIN (MYCOSTATIN) 100000 UNIT/GM POWDER    Apply to affected area TID PRN     OXYCODONE 5 MG CAPSULE    Take 5 mg by mouth every 4 hours as needed.    PANTOPRAZOLE (PROTONIX) 40 MG TABLET    Take 40 mg by mouth daily    ROSUVASTATIN (CRESTOR) 20 MG TABLET    TAKE 1 TABLET EVERY NIGHT    TRIAMTERENE-HYDROCHLOROTHIAZIDE (DYAZIDE) 37.5-25 MG PER CAPSULE    TAKE 1 CAPSULE EVERY DAY       ALLERGIES     Almond oil, Macadamia nut oil, and Other    FAMILY HISTORY       Family History   Problem Relation Age of Onset    Heart Disease Mother         rheumatic fever    Diabetes Mother     Cancer Brother         throat    Breast Cancer Sister     Cancer Sister         colon & breast    Breast Cancer Mother     Cancer Father         lung          SOCIAL HISTORY       Social History     Socioeconomic History    Marital status: Divorced   Tobacco Use    Smoking status: Former     Current packs/day: 0.00     Types: Cigarettes     Quit date: 06/03/1991     Years since quitting: 31.0    Smokeless tobacco: Never   Substance and Sexual Activity    Alcohol use: No     Alcohol/week: 0.0 standard drinks of alcohol    Drug use: No           PHYSICAL EXAM       ED Triage Vitals [05/30/22 2033]   BP Temp Temp Source Pulse Respirations SpO2 Height Weight   (!) 155/74 98 F (36.7 C) Oral 67 18 94 % -- --       There is no height or weight on file to calculate BMI.    Physical Exam  Vitals and nursing note reviewed.   Constitutional:  General: She is not in acute distress.     Appearance: Normal appearance. She is not ill-appearing or toxic-appearing.   HENT:      Head: Normocephalic and atraumatic.   Eyes:      General: No scleral icterus.        Right eye: No discharge.         Left eye: No discharge.      Conjunctiva/sclera: Conjunctivae normal.   Cardiovascular:      Rate and Rhythm: Normal rate.      Pulses: Normal pulses.   Pulmonary:      Effort: Pulmonary effort is normal. No respiratory distress.   Abdominal:      Tenderness: There is no abdominal tenderness. There is no guarding or rebound.    Musculoskeletal:         General: Normal range of motion.      Cervical back: Normal range of motion.   Skin:     General: Skin is warm and dry.      Capillary Refill: Capillary refill takes less than 2 seconds.   Neurological:      General: No focal deficit present.      Mental Status: She is alert.   Psychiatric:         Mood and Affect: Mood normal.         Behavior: Behavior normal.         DIAGNOSTIC RESULTS     EKG: All EKG's are interpreted by the Emergency Department Physician who either signs or Co-signs this chart in the absence of a cardiologist.    ED Course as of 05/30/22 2253   Tue May 30, 2022   2159 EKG 12 Lead  ECG at 2120, interpreted by me: Sinus rhythm with PACs, rate 69 bpm.  Right axis deviation.  Normal intervals.  T wave inversions in the inferior and lateral leads. [SH]      ED Course User Index  [SH] Ellsworth Lennox, DO       RADIOLOGY:   Non-plain film images such as CT, Ultrasound and MRI are read by the radiologist. Plain radiographic images are visualized and preliminarily interpreted by the emergency physician with the below findings:        Interpretation per the Radiologist below, if available at the time of this note:    XR CHEST PORTABLE   Final Result      No acute process on portable chest.              LABS:  Labs Reviewed   CBC WITH AUTO DIFFERENTIAL - Abnormal; Notable for the following components:       Result Value    RDW 16.2 (*)     Platelets 107 (*)     All other components within normal limits   EXTRA TUBES HOLD   COMPREHENSIVE METABOLIC PANEL   LIPASE   MAGNESIUM   TROPONIN   URINALYSIS WITH REFLEX TO CULTURE       All other labs were within normal range or not returned as of this dictation.    EMERGENCY DEPARTMENT COURSE and DIFFERENTIAL DIAGNOSIS/MDM:   Vitals:    Vitals:    05/30/22 2033 05/30/22 2215 05/30/22 2217   BP: (!) 155/74     Pulse: 67     Resp: 18     Temp: 98 F (36.7 C)     TempSrc: Oral     SpO2: 94% (!) 87% 90%  Medical Decision  Making  Amount and/or Complexity of Data Reviewed  Labs: ordered.  Radiology: ordered.  ECG/medicine tests: ordered. Decision-making details documented in ED Course.        CONSULTS:  None    REASSESSMENT       **The patient has been re-evaluated and feeling much better and are stable for discharge**.  All available radiology and laboratory results have been reviewed with patient and/or available family.  Patient and/or family verbally conveyed their understanding and agreement of the patient's signs, symptoms, diagnosis, treatment and prognosis and additionally agree to follow-up as recommended in the discharge instructions or to return to the Emergency Department should their condition change or worsen prior to their follow-up appointment.  All questions have been answered and patient and/or available family express understanding.        PROCEDURES:  Unless otherwise noted below, none     Procedures      FINAL IMPRESSION      1. Chest pain, unspecified type          DISPOSITION/PLAN   DISPOSITION        PATIENT REFERRED TO:  No follow-up provider specified.    DISCHARGE MEDICATIONS:  New Prescriptions    No medications on file         (Please note that portions of this note were completed with a voice recognition program.  Efforts were made to edit the dictations but occasionally words are mis-transcribed.)    Ellsworth Lennox, DO (electronically signed)  Emergency Attending Physician / Physician Assistant / Nurse Practitioner

## 2022-05-30 NOTE — ED Triage Notes (Signed)
Patient arrives via wheelchair with c/o chest pain for the last two days. No SOB

## 2022-05-31 ENCOUNTER — Encounter: Payer: MEDICARE | Primary: Geriatric Medicine

## 2022-05-31 ENCOUNTER — Inpatient Hospital Stay
Admit: 2022-05-31 | Discharge: 2022-05-31 | Disposition: A | Discharge status: Home / Self Care | Payer: MEDICARE | Attending: Emergency Medicine

## 2022-05-31 LAB — URINALYSIS WITH REFLEX TO CULTURE
BACTERIA, URINE: NEGATIVE /hpf
Bilirubin Urine: NEGATIVE
Blood, Urine: NEGATIVE
Glucose, UA: NEGATIVE mg/dL
Ketones, Urine: NEGATIVE mg/dL
Leukocyte Esterase, Urine: NEGATIVE
Nitrite, Urine: NEGATIVE
Protein, UA: 30 mg/dL — AB
Specific Gravity, UA: 1.022 (ref 1.003–1.030)
Urobilinogen, Urine: 2 EU/dL — ABNORMAL HIGH (ref 0.2–1.0)
pH, Urine: 7.5 (ref 5.0–8.0)

## 2022-05-31 LAB — CBC WITH AUTO DIFFERENTIAL
Absolute Immature Granulocyte: 0 10*3/uL (ref 0.00–0.04)
Basophils %: 1 % (ref 0–1)
Basophils Absolute: 0 10*3/uL (ref 0.0–0.1)
Eosinophils %: 3 % (ref 0–7)
Eosinophils Absolute: 0.1 10*3/uL (ref 0.0–0.4)
Hematocrit: 40.7 % (ref 35.0–47.0)
Hemoglobin: 13.9 g/dL (ref 11.5–16.0)
Immature Granulocytes: 0 % (ref 0.0–0.5)
Lymphocytes %: 22 % (ref 12–49)
Lymphocytes Absolute: 0.9 10*3/uL (ref 0.8–3.5)
MCH: 31.4 PG (ref 26.0–34.0)
MCHC: 34.2 g/dL (ref 30.0–36.5)
MCV: 92.1 FL (ref 80.0–99.0)
Monocytes %: 9 % (ref 5–13)
Monocytes Absolute: 0.3 10*3/uL (ref 0.0–1.0)
Neutrophils %: 66 % (ref 32–75)
Neutrophils Absolute: 2.6 10*3/uL (ref 1.8–8.0)
Nucleated RBCs: 0 PER 100 WBC
Platelets: 107 10*3/uL — ABNORMAL LOW (ref 150–400)
RBC: 4.42 M/uL (ref 3.80–5.20)
RDW: 16.2 % — ABNORMAL HIGH (ref 11.5–14.5)
WBC: 4 10*3/uL (ref 3.6–11.0)
nRBC: 0 10*3/uL (ref 0.00–0.01)

## 2022-05-31 LAB — LIPASE: Lipase: 210 U/L — ABNORMAL HIGH (ref 13–75)

## 2022-05-31 LAB — COMPREHENSIVE METABOLIC PANEL
ALT: 89 U/L — ABNORMAL HIGH (ref 12–78)
AST: 136 U/L — ABNORMAL HIGH (ref 15–37)
Albumin/Globulin Ratio: 0.8 — ABNORMAL LOW (ref 1.1–2.2)
Albumin: 3.4 g/dL — ABNORMAL LOW (ref 3.5–5.0)
Alk Phosphatase: 108 U/L (ref 45–117)
Anion Gap: 10 mmol/L (ref 5–15)
BUN: 26 MG/DL — ABNORMAL HIGH (ref 6–20)
Bun/Cre Ratio: 19 (ref 12–20)
CO2: 24 mmol/L (ref 21–32)
Calcium: 9.3 MG/DL (ref 8.5–10.1)
Chloride: 102 mmol/L (ref 97–108)
Creatinine: 1.35 MG/DL — ABNORMAL HIGH (ref 0.55–1.02)
Est, Glom Filt Rate: 40 mL/min/{1.73_m2} — ABNORMAL LOW (ref >60)
Globulin: 4.2 g/dL — ABNORMAL HIGH (ref 2.0–4.0)
Glucose: 137 mg/dL — ABNORMAL HIGH (ref 65–100)
Potassium: 3.2 mmol/L — ABNORMAL LOW (ref 3.5–5.1)
Sodium: 136 mmol/L (ref 136–145)
Total Bilirubin: 1.7 MG/DL — ABNORMAL HIGH (ref 0.2–1.0)
Total Protein: 7.6 g/dL (ref 6.4–8.2)

## 2022-05-31 LAB — EXTRA TUBES HOLD

## 2022-05-31 LAB — MAGNESIUM: Magnesium: 1.9 mg/dL (ref 1.6–2.4)

## 2022-05-31 LAB — TROPONIN: Troponin, High Sensitivity: 14 ng/L (ref 0–51)

## 2022-05-31 MED ORDER — ALUM & MAG HYDROXIDE-SIMETH 200-200-20 MG/5ML PO SUSP
200-200-20 | Freq: Once | ORAL | Status: AC
Start: 2022-05-31 — End: 2022-05-30
  Administered 2022-05-31: 02:00:00 40 mL via ORAL

## 2022-05-31 MED ORDER — POTASSIUM CHLORIDE ER 10 MEQ PO TBCR
10 MEQ | Freq: Once | ORAL | Status: AC
Start: 2022-05-31 — End: 2022-05-31
  Administered 2022-05-31: 04:00:00 20 meq via ORAL

## 2022-05-31 MED ORDER — IOPAMIDOL 76 % IV SOLN
76 | Freq: Once | INTRAVENOUS | Status: AC | PRN
Start: 2022-05-31 — End: 2022-05-30
  Administered 2022-05-31: 03:00:00 100 mL via INTRAVENOUS

## 2022-05-31 MED ORDER — DICYCLOMINE HCL 20 MG PO TABS
20 MG | ORAL_TABLET | Freq: Four times a day (QID) | ORAL | 0 refills | Status: AC
Start: 2022-05-31 — End: 2022-06-07

## 2022-05-31 MED FILL — KLOR-CON 10 10 MEQ PO TBCR: 10 MEQ | ORAL | Qty: 2

## 2022-05-31 MED FILL — MAG-AL PLUS 200-200-20 MG/5ML PO LIQD: 200-200-20 MG/5ML | ORAL | Qty: 30

## 2022-05-31 NOTE — Discharge Instructions (Signed)
Return with any worsening symptoms or new symptoms you find concerning.  Recommend you follow-up with your GI doctor as well as gastroenterology by calling the numbers provided

## 2022-06-01 ENCOUNTER — Emergency Department: Admit: 2022-06-02 | Payer: MEDICARE | Primary: Geriatric Medicine

## 2022-06-01 DIAGNOSIS — K8050 Calculus of bile duct without cholangitis or cholecystitis without obstruction: Secondary | ICD-10-CM

## 2022-06-01 NOTE — ED Provider Notes (Signed)
EMERGENCY DEPARTMENT PHYSICIAN NOTE     Patient: Allison Newman     Time of Service: 06/01/2022  9:27 PM     Chief complaint:   Chief Complaint   Patient presents with    Chest Pain        HISTORY:  Patient is a 82 y.o. female who presents to the emergency department with complaints of central chest pain.  Patient seen yesterday for the same.  Patient had elevated bilirubin and liver enzymes at that time but CT scan showed no concerning findings in the gallbladder.  Patient's pain still severe and intolerable.  Patient hypoxic in triage but is supposed be wearing 5 L nasal cannula at baseline.  Vital signs stable.  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    Chronic pain of left knee 08/13/2014    Chronic right hip pain 03/17/2013    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    Diabetes ([place])     Elevated BUN 09/13/2011    Finger lesion 03/02/2016    Gallbladder polyp 04/08/2013    Gastrointestinal disorder     GERD    GERD (gastroesophageal reflux disease)     Hypercholesterolemia     Hypertension     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    Microalbuminuria 08/29/2013    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, adult ([place]) 08/28/2011    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    On aspirin at home 05/03/2015    Other unknown and unspecified cause of morbidity or mortality     hx of bronchitis    Rash, skin 09/25/2012    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[place]) 03/02/2016    Slow transit constipation [id number]    Umbilical hernia [id number]        Past Surgical History:   Procedure Laterality Date    COLONOSCOPY N/A 04/23/2018    COLONOSCOPY AND ESOPHAGOGASTRODUODENOSCOPY (EGD) performed by Marlise Eves., MD at [place]    screw in foot left    ORTHOPEDIC SURGERY  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    OTHER SURGICAL HISTORY      colonoscopy    TUBAL LIGATION          Family History   Problem Relation Age of Onset    Heart Disease Mother         rheumatic fever    Diabetes Mother     Cancer Brother         throat    Breast Cancer Sister     Cancer Sister         colon & breast    Breast Cancer Mother     Cancer Father         lung        Social History     Socioeconomic History    Marital status: Divorced     Spouse name: None    Number of children: None    Years of education: None    Highest education level: None   Tobacco Use  Smoking status: Former     Current packs/day: 0.00     Types: Cigarettes     Quit date: 06/03/1991     Years since quitting: 31.0    Smokeless tobacco: Never   Substance and Sexual Activity    Alcohol use: No     Alcohol/week: 0.0 standard drinks of alcohol    Drug use: No        Current Medications: Reviewed in chart.    Allergies:   Allergies   Allergen Reactions    Almond Oil Anaphylaxis, Hives and Itching    Macadamia Nut Oil Anaphylaxis    Other Anaphylaxis     All tree nuts           REVIEW OF SYSTEMS: See HPI for pertinent positives and negatives.      PHYSICAL EXAM:  BP 131/75   Pulse 81   Temp 97.5 F (36.4 C) (Oral)   Resp 16   Ht 1.6 m (5\' 3" )   Wt 129.3 kg (285 lb)   SpO2 (!) 88%   BMI 50.49 kg/m    Physical Exam  Vitals and nursing note reviewed.   Constitutional:       Appearance: She is obese. She is ill-appearing.   HENT:      Head: Normocephalic and atraumatic.      Right Ear: External ear normal.      Left Ear: External ear normal.      Nose: Nose normal.      Mouth/Throat:      Mouth: Mucous membranes are moist.   Eyes:      Extraocular Movements: Extraocular movements intact.      Conjunctiva/sclera: Conjunctivae normal.   Cardiovascular:      Rate and Rhythm: Normal rate and regular rhythm.      Pulses: Normal pulses.    Pulmonary:      Effort: Pulmonary effort is normal.      Breath sounds: Normal breath sounds.   Abdominal:      Palpations: Abdomen is soft.      Tenderness: There is abdominal tenderness.   Musculoskeletal:         General: No tenderness. Normal range of motion.      Cervical back: Normal range of motion.   Skin:     General: Skin is warm and dry.   Neurological:      General: No focal deficit present.      Mental Status: She is alert and oriented to person, place, and time.   Psychiatric:         Mood and Affect: Mood normal.         Behavior: Behavior normal.           ED Course:    ED Course as of 06/01/22 2348   Thu Jun 01, 2022   2122 EKG shows sinus rhythm with a rate of 79 with premature ventricular complexes, right axis deviation, incomplete right bundle branch block, nonspecific ST and T wave abnormality. [AL]   2130 SpO2(!): 84 %  Patient is post to wear 5 L at baseline.  She states she wears it sometimes other times she does not when her oxygen is in the 90s. [AL]   2258 Lipase: 62 [AL]   2258 Magnesium: 2.2 [AL]   2258 BILIRUBIN TOTAL(!): 1.6 [AL]   2258 WBC: 4.2 [AL]   2327 General surgery consulted.  Recommend HIDA scan, If concerning the chole tube placement be appropriate for this patient with  comorbidities. [AL]      ED Course User Index  [AL] Delma Post, MD         ED physician interpretation of EKG: No STEMI.  See my interpretation of EKG in ED course above.    Laboratory Results:  Labs Reviewed   CBC WITH AUTO DIFFERENTIAL - Abnormal; Notable for the following components:       Result Value    RDW 16.2 (*)     Platelets 113 (*)     All other components within normal limits   COMPREHENSIVE METABOLIC PANEL - Abnormal; Notable for the following components:    Potassium 3.4 (*)     Glucose 141 (*)     BUN 23 (*)     Creatinine 1.28 (*)     Est, Glom Filt Rate 43 (*)     Total Bilirubin 1.6 (*)     Albumin 3.3 (*)     Globulin 4.1 (*)     Albumin/Globulin Ratio 0.8 (*)     All other components  within normal limits   TROPONIN   LIPASE   MAGNESIUM   EXTRA TUBES HOLD     ED physician interpretation of laboratory results: Documented in ED course    Imaging Results:  US ABDOMEN LIMITED Specify organ? GALLBLADDER   Final Result   Heterogeneous echogenicity of the liver with slightly irregular   border suggesting possible early cirrhotic change or other diffuse   hepatocellular process gallbladder sludge with tenderness but no other   ultrasonographic signs of cholecystitis..        ED physician interpretation of imaging: Documented in ED course    Medications Given:  Medications   morphine (PF) injection 2 mg (has no administration in time range)       Differential Diagnosis included but not limited to: Chest pain, ACS, hypoxia, COPD, gallbladder disease, elevated bilirubin, elevated liver enzymes    Medical Decision Making  Patient is 82 year old female with severe COPD presented to ED with central chest pain found to have elevated bilirubin and biliary sludge on ultrasound.  No concerning findings on ACS workup/pulmonary workup.  Extensive workup in yesterday was unremarkable as well besides the elevated bilirubin and liver enzymes.  General surgery consulted.  They recommend hospital admission for observation HIDA scan.  Patient comfortable with this plan.  Hospital service contacted and patient admitted    Amount and/or Complexity of Data Reviewed  Labs: ordered. Decision-making details documented in ED Course.  Radiology: ordered.  ECG/medicine tests: ordered.          Procedures       DISPOSITION:      [facility] Consult for Admission  11:48 PM    ED Room Number: ER22/22  Patient Name and age:  LYSBETH RAHAIM 82 y.o.  female  Working Diagnosis:   1. Biliary colic    2. Biliary sludge    3. Chest pain, unspecified type    4. Chronic obstructive pulmonary disease, unspecified COPD type ([place])        COVID-19 Suspicion: No  Sepsis present:  No  Reassessment needed: No  Code Status:  Full  Code  Readmission: No  Isolation Requirements: no  Recommended Level of Care: telemetry  Department: [facility] Adult ED - [id number]    Other: Patient presented to the ED with central chest pain.  Was seen yesterday and worked up with and found to have hyperbilirubinemia, liver enzyme elevation.  Returns today with continued pain in the  chest.  Ultrasound shows biliary sludge and pain with test.  Patient's pain is severe.  General surgery contacted, Dr Murvin Donning.  They recommended admission and observation and HIDA scan and possible chole tube if test positive.  Of note patient has COPD is on 5 L of oxygen at baseline.  Chest pain workup/pulmonary workup unremarkable yesterday and today.    Delma Post, MD   Emergency Medicine Attending Physician        Delma Post, MD  06/01/22 [phone]

## 2022-06-01 NOTE — ED Triage Notes (Signed)
Pt. Presents with 10/10 mid-sternal non radiating, non reproducible chest pain that started on Tuesday this week. Pt. Was evaluated at our facility yesterday, hypoxic on arrival to triage with a oxygen of 84% on room air. Pt. 87-88% while resting in wheelchair deep breathing, denies shortness of breath. Pt. States she is supposed to wear supplemental oxygen at [facility], states she does not always need it. Pt. A/OX4, RR even and unlabored, NAD. VSS. Dr. Laverle Patter evaluating pt in triage booth at this time.

## 2022-06-02 ENCOUNTER — Observation Stay: Admit: 2022-06-02 | Payer: MEDICARE | Primary: Geriatric Medicine

## 2022-06-02 ENCOUNTER — Inpatient Hospital Stay
Admission: EM | Admit: 2022-06-02 | Discharge: 2022-06-03 | Disposition: A | Discharge status: Home / Self Care | Payer: MEDICARE | Admitting: Family Medicine

## 2022-06-02 LAB — CBC WITH AUTO DIFFERENTIAL
Absolute Immature Granulocyte: 0 10*3/uL (ref 0.00–0.04)
Basophils %: 1 % (ref 0–1)
Basophils Absolute: 0 10*3/uL (ref 0.0–0.1)
Eosinophils %: 2 % (ref 0–7)
Eosinophils Absolute: 0.1 10*3/uL (ref 0.0–0.4)
Hematocrit: 41.3 % (ref 35.0–47.0)
Hemoglobin: 14.1 g/dL (ref 11.5–16.0)
Immature Granulocytes: 0 % (ref 0.0–0.5)
Lymphocytes %: 20 % (ref 12–49)
Lymphocytes Absolute: 0.8 10*3/uL (ref 0.8–3.5)
MCH: 31.7 PG (ref 26.0–34.0)
MCHC: 34.1 g/dL (ref 30.0–36.5)
MCV: 92.8 FL (ref 80.0–99.0)
MPV: 12.2 FL (ref 8.9–12.9)
Monocytes %: 9 % (ref 5–13)
Monocytes Absolute: 0.4 10*3/uL (ref 0.0–1.0)
Neutrophils %: 68 % (ref 32–75)
Neutrophils Absolute: 2.9 10*3/uL (ref 1.8–8.0)
Nucleated RBCs: 0 PER 100 WBC
Platelets: 113 10*3/uL — ABNORMAL LOW (ref 150–400)
RBC: 4.45 M/uL (ref 3.80–5.20)
RDW: 16.2 % — ABNORMAL HIGH (ref 11.5–14.5)
WBC: 4.2 10*3/uL (ref 3.6–11.0)
nRBC: 0 10*3/uL (ref 0.00–0.01)

## 2022-06-02 LAB — COMPREHENSIVE METABOLIC PANEL
ALT: 44 U/L (ref 12–78)
AST: 27 U/L (ref 15–37)
Albumin/Globulin Ratio: 0.8 — ABNORMAL LOW (ref 1.1–2.2)
Albumin: 3.3 g/dL — ABNORMAL LOW (ref 3.5–5.0)
Alk Phosphatase: 81 U/L (ref 45–117)
Anion Gap: 10 mmol/L (ref 5–15)
BUN: 23 MG/DL — ABNORMAL HIGH (ref 6–20)
Bun/Cre Ratio: 18 (ref 12–20)
CO2: 24 mmol/L (ref 21–32)
Calcium: 9.2 MG/DL (ref 8.5–10.1)
Chloride: 106 mmol/L (ref 97–108)
Creatinine: 1.28 MG/DL — ABNORMAL HIGH (ref 0.55–1.02)
Est, Glom Filt Rate: 43 mL/min/{1.73_m2} — ABNORMAL LOW (ref >60)
Globulin: 4.1 g/dL — ABNORMAL HIGH (ref 2.0–4.0)
Glucose: 141 mg/dL — ABNORMAL HIGH (ref 65–100)
Potassium: 3.4 mmol/L — ABNORMAL LOW (ref 3.5–5.1)
Sodium: 140 mmol/L (ref 136–145)
Total Bilirubin: 1.6 MG/DL — ABNORMAL HIGH (ref 0.2–1.0)
Total Protein: 7.4 g/dL (ref 6.4–8.2)

## 2022-06-02 LAB — EKG 12-LEAD
Atrial Rate: 79 {beats}/min
P Axis: 78 degrees
P-R Interval: 192 ms
Q-T Interval: 382 ms
QRS Duration: 92 ms
QTc Calculation (Bazett): 438 ms
R Axis: 124 degrees
T Axis: -63 degrees
Ventricular Rate: 79 {beats}/min

## 2022-06-02 LAB — EXTRA TUBES HOLD

## 2022-06-02 LAB — LIPASE: Lipase: 62 U/L (ref 13–75)

## 2022-06-02 LAB — TROPONIN: Troponin, High Sensitivity: 16 ng/L (ref 0–51)

## 2022-06-02 LAB — MAGNESIUM: Magnesium: 2.2 mg/dL (ref 1.6–2.4)

## 2022-06-02 MED ORDER — FUROSEMIDE 40 MG PO TABS
40 | Freq: Every day | ORAL | Status: DC
Start: 2022-06-02 — End: 2022-06-03
  Administered 2022-06-02: 19:00:00 40 mg via ORAL

## 2022-06-02 MED ORDER — PANTOPRAZOLE SODIUM 40 MG PO TBEC
40 | Freq: Every day | ORAL | Status: DC
Start: 2022-06-02 — End: 2022-06-03
  Administered 2022-06-02 – 2022-06-03 (×2): 40 mg via ORAL

## 2022-06-02 MED ORDER — ACETAMINOPHEN 650 MG RE SUPP
650 | Freq: Four times a day (QID) | RECTAL | Status: DC | PRN
Start: 2022-06-02 — End: 2022-06-03

## 2022-06-02 MED ORDER — POTASSIUM CHLORIDE ER 10 MEQ PO TBCR
10 | ORAL | Status: DC | PRN
Start: 2022-06-02 — End: 2022-06-03

## 2022-06-02 MED ORDER — SODIUM CHLORIDE 0.9 % IV SOLN
0.9 | INTRAVENOUS | Status: DC
Start: 2022-06-02 — End: 2022-06-03
  Administered 2022-06-02 – 2022-06-03 (×4): via INTRAVENOUS

## 2022-06-02 MED ORDER — ONDANSETRON 4 MG PO TBDP
4 | Freq: Three times a day (TID) | ORAL | Status: DC | PRN
Start: 2022-06-02 — End: 2022-06-03

## 2022-06-02 MED ORDER — SODIUM CHLORIDE 0.9 % IV BOLUS
0.9 | Freq: Once | INTRAVENOUS | Status: AC
Start: 2022-06-02 — End: 2022-06-02
  Administered 2022-06-02: 11:00:00 1000 mL via INTRAVENOUS

## 2022-06-02 MED ORDER — POTASSIUM BICARB-CITRIC ACID 20 MEQ PO TBEF
20 | ORAL | Status: DC | PRN
Start: 2022-06-02 — End: 2022-06-03

## 2022-06-02 MED ORDER — LIDOCAINE 4 % EX PTCH
4 | Freq: Every day | CUTANEOUS | Status: DC
Start: 2022-06-02 — End: 2022-06-03
  Administered 2022-06-02: 20:00:00 1 via TRANSDERMAL

## 2022-06-02 MED ORDER — ONDANSETRON HCL 4 MG/2ML IJ SOLN
4 | Freq: Four times a day (QID) | INTRAMUSCULAR | Status: DC | PRN
Start: 2022-06-02 — End: 2022-06-03

## 2022-06-02 MED ORDER — NORMAL SALINE FLUSH 0.9 % IV SOLN
0.9 | Freq: Two times a day (BID) | INTRAVENOUS | Status: DC
Start: 2022-06-02 — End: 2022-06-03
  Administered 2022-06-02 – 2022-06-03 (×2): 10 mL via INTRAVENOUS

## 2022-06-02 MED ORDER — TECHNETIUM TC 99M MEBROFENIN IV KIT
Freq: Once | INTRAVENOUS | Status: AC | PRN
Start: 2022-06-02 — End: 2022-06-02
  Administered 2022-06-02: 14:00:00 4.9 via INTRAVENOUS

## 2022-06-02 MED ORDER — SINCALIDE 5 MCG IJ SOLR
5 | Freq: Once | INTRAMUSCULAR | Status: AC
Start: 2022-06-02 — End: 2022-06-02
  Administered 2022-06-02: 14:00:00 2.5 ug/kg via INTRAVENOUS

## 2022-06-02 MED ORDER — POTASSIUM CHLORIDE 10 MEQ/100ML IV SOLN
10 | INTRAVENOUS | Status: DC | PRN
Start: 2022-06-02 — End: 2022-06-03

## 2022-06-02 MED ORDER — METHIMAZOLE 5 MG PO TABS
5 | Freq: Every day | ORAL | Status: DC
Start: 2022-06-02 — End: 2022-06-03
  Administered 2022-06-02 – 2022-06-03 (×2): 5 mg via ORAL

## 2022-06-02 MED ORDER — ROSUVASTATIN CALCIUM 10 MG PO TABS
10 | Freq: Every evening | ORAL | Status: DC
Start: 2022-06-02 — End: 2022-06-03
  Administered 2022-06-03: 01:00:00 20 mg via ORAL

## 2022-06-02 MED ORDER — POLYETHYLENE GLYCOL 3350 17 G PO PACK
17 | Freq: Every day | ORAL | Status: DC | PRN
Start: 2022-06-02 — End: 2022-06-03

## 2022-06-02 MED ORDER — ASPIRIN 81 MG PO TBEC
81 | Freq: Every day | ORAL | Status: DC
Start: 2022-06-02 — End: 2022-06-03
  Administered 2022-06-02 – 2022-06-03 (×2): 81 mg via ORAL

## 2022-06-02 MED ORDER — MORPHINE SULFATE (PF) 2 MG/ML IV SOLN
2 | INTRAVENOUS | Status: AC
Start: 2022-06-02 — End: 2022-06-02
  Administered 2022-06-02: 06:00:00 2 mg via INTRAVENOUS

## 2022-06-02 MED ORDER — METOPROLOL TARTRATE 50 MG PO TABS
50 | Freq: Two times a day (BID) | ORAL | Status: DC
Start: 2022-06-02 — End: 2022-06-03
  Administered 2022-06-02 – 2022-06-03 (×2): 50 mg via ORAL

## 2022-06-02 MED ORDER — ACETAMINOPHEN 325 MG PO TABS
325 | Freq: Four times a day (QID) | ORAL | Status: DC | PRN
Start: 2022-06-02 — End: 2022-06-03
  Administered 2022-06-03: 02:00:00 650 mg via ORAL

## 2022-06-02 MED ORDER — MAGNESIUM SULFATE 2000 MG/50 ML IVPB PREMIX
2 | INTRAVENOUS | Status: DC | PRN
Start: 2022-06-02 — End: 2022-06-03

## 2022-06-02 MED ORDER — BUDESONIDE 0.5 MG/2ML IN SUSP
0.5 | Freq: Two times a day (BID) | RESPIRATORY_TRACT | Status: DC
Start: 2022-06-02 — End: 2022-06-03
  Administered 2022-06-03 (×2): via RESPIRATORY_TRACT

## 2022-06-02 MED ORDER — LIDOCAINE HCL (PF) 1 % IJ SOLN
1 | Freq: Once | INTRAMUSCULAR | Status: AC
Start: 2022-06-02 — End: 2022-06-02
  Administered 2022-06-02: 06:00:00 2 mL via INTRADERMAL

## 2022-06-02 MED ORDER — TRIAMTERENE-HCTZ 37.5-25 MG PO TABS
Freq: Every day | ORAL | Status: DC
Start: 2022-06-02 — End: 2022-06-03
  Administered 2022-06-02: 19:00:00 1 via ORAL

## 2022-06-02 MED ORDER — SODIUM CHLORIDE 0.9 % IV SOLN
0.9 | INTRAVENOUS | Status: DC | PRN
Start: 2022-06-02 — End: 2022-06-03

## 2022-06-02 MED ORDER — NORMAL SALINE FLUSH 0.9 % IV SOLN
0.9 | INTRAVENOUS | Status: DC | PRN
Start: 2022-06-02 — End: 2022-06-03

## 2022-06-02 MED ORDER — MELOXICAM 7.5 MG PO TABS
7.5 | Freq: Every day | ORAL | Status: DC
Start: 2022-06-02 — End: 2022-06-03
  Administered 2022-06-02 – 2022-06-03 (×2): 7.5 mg via ORAL

## 2022-06-02 MED FILL — SODIUM CHLORIDE 0.9 % IV SOLN: 0.9 % | INTRAVENOUS | Qty: 1000

## 2022-06-02 MED FILL — LIDOCAINE HCL (PF) 1 % IJ SOLN: 1 % | INTRAMUSCULAR | Qty: 5

## 2022-06-02 MED FILL — METOPROLOL TARTRATE 50 MG PO TABS: 50 MG | ORAL | Qty: 1

## 2022-06-02 MED FILL — PANTOPRAZOLE SODIUM 40 MG PO TBEC: 40 MG | ORAL | Qty: 1

## 2022-06-02 MED FILL — TRIAMTERENE-HCTZ 37.5-25 MG PO TABS: ORAL | Qty: 1

## 2022-06-02 MED FILL — LIDOCAINE PAIN RELIEF 4 % EX PTCH: 4 % | CUTANEOUS | Qty: 1

## 2022-06-02 MED FILL — MORPHINE SULFATE 2 MG/ML IJ SOLN: 2 mg/mL | INTRAMUSCULAR | Qty: 1

## 2022-06-02 MED FILL — FUROSEMIDE 40 MG PO TABS: 40 MG | ORAL | Qty: 1

## 2022-06-02 MED FILL — KINEVAC 5 MCG IJ SOLR: 5 MCG | INTRAMUSCULAR | Qty: 5

## 2022-06-02 MED FILL — METHIMAZOLE 5 MG PO TABS: 5 MG | ORAL | Qty: 1

## 2022-06-02 MED FILL — ASPIRIN LOW DOSE 81 MG PO TBEC: 81 MG | ORAL | Qty: 1

## 2022-06-02 MED FILL — MELOXICAM 7.5 MG PO TABS: 7.5 MG | ORAL | Qty: 1

## 2022-06-02 MED FILL — MONOJECT FLUSH SYRINGE 0.9 % IV SOLN: 0.9 % | INTRAVENOUS | Qty: 40

## 2022-06-02 NOTE — Progress Notes (Signed)
[facility]                                                                                          Hospitalist Progress Note  Caren Macadam, [place]  Answering service: [phone] OR [phone] from in house phone        Date of Service:  06/02/2022  NAME:  Allison Newman  DOB:  1940-03-22  MRN:  [id number]      Admission Summary:   Allison Newman is a 82 y.o. female with pmh of morbid obesity, htn, niddm ii, dyslipidemia, gerd, chronic pain who presented to ed with complaints of chest wall pain. Reports near 1 week of severe, intermittent, sharp 8/10 left chest wall pain aggravated by certain arm movements and relieved with rest. Was seen in ed 25, discharged home.  His symptoms persisted; forcing her to return to emergency room.  The patient denies any fever, chills, abdominal pain, nausea, vomiting, cough, congestion, recent illness, palpitations, or dysuria.     Remarkable vitals on ER Presentation: vss  Labs Remarkable for: k-3.4  ER Images: Right upper quadrant ultrasound showed biliary sludge  ER Rx: None.    Interval history / Subjective:   Notified of PSVT event that occurred overnight, I reviewed the rhythm strips and the patient is now in NSR.     There was a concern expressed about the patient's respiratory status. At bedside today she states her breathing feels good, with no shortness of breath, productive cough, or wheezing. She does report some epigastric pain that is worsened with movement, she denies any chest pain.     Assessment & Plan:     Cholestasis  - See right upper quadrant ultrasound findings  - Patient has tenderness to's positive Murphy's  - HIDA scan pending  - General surgery on consult.  Appreciate recs     COPD  - Patient on wixela at home. Per [facility] notes she does wear supplemental O2 PRN, but it is unclear how much. It seems that her SpO2 improved with ambulation at [facility] which may be suggestive of restrictive etiology.   - Patient with no  respiratory complaints today  - Continue home scheduled and PRN nebs  - Supplemental O2 prn for SpO2 > 88%  - Consider pulmonology consult    ? PSVT  - Noted rhythm strips, will obtain 12 lead ECG    Chest wall pain /costochondritis  - Continue PTA as needed analgesics  - PT OT evaluation     HTN  -Continue on metoprolol AND DYAZIDE     Gerd   - PPI    Dyslipidemia  - Continue Statin     Obesity  -Counseled on weight loss, dieting and exercise      Hyperthyroidism  - Continue methimazole    Code status:   Prophylaxis:   Care Plan discussed with:   Anticipated Disposition:            Review of Systems:   Pertinent items are noted in HPI.       Vital Signs:    Last 24hrs VS  reviewed since prior progress note. Most recent are:  Vitals:    06/02/22 0804   BP: 118/61   Pulse: 71   Resp: 16   Temp: 98.1 F (36.7 C)   SpO2: 90%         Intake/Output Summary (Last 24 hours) at 06/02/2022 [id number]  Last data filed at 06/02/2022 0732  Gross per 24 hour   Intake 438.68 ml   Output --   Net 438.68 ml        Physical Examination:     I had a face to face encounter with this patient and independently examined them on 06/02/2022 as outlined below:          Constitutional:  No acute distress, cooperative, pleasant    ENT:  Oral mucosa moist, oropharynx benign.    Resp:  CTA bilaterally. No wheezing/rhonchi/rales. No accessory muscle use, on nasal cannula   CV:  Regular rhythm, normal rate, no murmurs, gallops, rubs    GI:  Soft, non distended, tender to palpation in epigastric region. normoactive bowel sounds     Musculoskeletal:  No edema, warm, 2+ pulses throughout    Neurologic:  Moves all extremities.  AAOx3, CN II-XII reviewed            Data Review:    Review and/or order of clinical lab test  Review and/or order of tests in the radiology section of CPT  Review and/or order of tests in the medicine section of CPT      Labs:     Recent Labs     05/30/22  2143 06/01/22  2201   WBC 4.0 4.2   HGB 13.9 14.1   HCT 40.7 41.3   PLT 107*  113*     Recent Labs     05/30/22  2143 06/01/22  2201   NA 136 140   K 3.2* 3.4*   CL 102 106   CO2 24 24   BUN 26* 23*   MG 1.9 2.2     Recent Labs     05/30/22  2143 06/01/22  2201   ALT 89* 44   GLOB 4.2* 4.1*     No results for input(s): "INR", "APTT" in the last 72 hours.    Invalid input(s): "PTP"   No results for input(s): "TIBC", "FERR" in the last 72 hours.    Invalid input(s): "FE", "PSAT"   No results found for: "FOL", "RBCF"   No results for input(s): "PH", "PCO2", "PO2" in the last 72 hours.  No results for input(s): "CPK" in the last 72 hours.    Invalid input(s): "CPKMB", "CKNDX", "TROIQ"  No results found for: "CHOL", "[place]", "CHLST", "CHOLV", "HDL", "HDLC", "LDL", "LDLC", "TGLX", "TRIGL"  Lab Results   Component Value Date/Time    GLUCPOC 237 01/15/2018 10:37 AM    GLUCPOC 174 10/12/2017 11:24 AM     @LABUA @      Medications Reviewed:     Current Facility-Administered Medications   Medication Dose Route Frequency    sodium chloride flush 0.9 % injection 5-40 mL  5-40 mL IntraVENous 2 times per day    sodium chloride flush 0.9 % injection 5-40 mL  5-40 mL IntraVENous PRN    0.9 % sodium chloride infusion   IntraVENous PRN    potassium chloride (KLOR-CON) extended release tablet 40 mEq  40 mEq Oral PRN    Or    potassium bicarb-citric acid (EFFER-K) effervescent tablet 40 mEq  40 mEq Oral PRN  Or    potassium chloride 10 mEq/100 mL IVPB (Peripheral Line)  10 mEq IntraVENous PRN    magnesium sulfate 2000 mg in 50 mL IVPB premix  2,000 mg IntraVENous PRN    ondansetron (ZOFRAN-ODT) disintegrating tablet 4 mg  4 mg Oral Q8H PRN    Or    ondansetron (ZOFRAN) injection 4 mg  4 mg IntraVENous Q6H PRN    polyethylene glycol (GLYCOLAX) packet 17 g  17 g Oral Daily PRN    acetaminophen (TYLENOL) tablet 650 mg  650 mg Oral Q6H PRN    Or    acetaminophen (TYLENOL) suppository 650 mg  650 mg Rectal Q6H PRN    0.9 % sodium chloride infusion   IntraVENous Continuous    aspirin EC tablet 81 mg  81 mg Oral Daily     furosemide (LASIX) tablet 40 mg  40 mg Oral Daily    methIMAzole (TAPAZOLE) tablet 5 mg  5 mg Oral Daily    metoprolol tartrate (LOPRESSOR) tablet 50 mg  50 mg Oral BID    pantoprazole (PROTONIX) tablet 40 mg  40 mg Oral Daily    rosuvastatin (CRESTOR) tablet 20 mg  20 mg Oral Nightly    triamterene-hydroCHLOROthiazide (MAXZIDE-25) 37.5-25 MG per tablet 1 tablet  1 tablet Oral Daily    arformoterol 15 mcg-budesonide 0.5 mg neb solution   Nebulization BID RT     ______________________________________________________________________  EXPECTED LENGTH OF STAY: 2  ACTUAL LENGTH OF STAY:          0                 Youlanda Roys Brigham Cobbins, PA-C

## 2022-06-02 NOTE — Progress Notes (Signed)
Physical Therapy 06/02/22    Received orders for PT evaluation.  Performed chart review and discussed patient with bedside RN.  Patient currently with increasing O2 requirements (up to 5-6L) and sating 88-90% at rest.  Also awaiting transfer off unit for HIDA scan.  RN requesting PT hold until respiratory status stabilizes.  Will follow-up for PT evaluation as patient is appropriate and available for PT.    Thank you,   Rolm Gala, PT, DPT

## 2022-06-02 NOTE — Progress Notes (Addendum)
450H Informed Dr Denver Faster regarding HR episode of 150s SVTachy then back to 14s. Rhythm strip provided by [facility]. Chest pain 6/10 Unchanged. No other complaints.     635H Started NSS 1025ml Bolus. Instructed by nuc med not to have narcotics and keep NPO till HIDA  scan done.

## 2022-06-02 NOTE — Progress Notes (Signed)
PATIENT refused to take medication at 0900   1030am, 1130, 1300, 254pm WILL not take any medication until her tray comes.

## 2022-06-02 NOTE — Progress Notes (Signed)
End of Shift Note    Bedside shift change report given to Janette LPN (oncoming nurse) by Lincoln Brigham, LPN (offgoing nurse).  Report included the following information SBAR    Shift worked:  0730-2000     Shift summary and any significant changes:     Nasal Cannula  Up to chair  Bedside commode  Tolerated medication  Hepatobiliary scan   Concerns for physician to address:  None     Zone phone for oncoming shift:   None       Activity:     Number times ambulated in hallways past shift: 0  Number of times OOB to chair past shift: 1    Cardiac:   Cardiac Monitoring: Yes           Access:  Current line(s): PIV     Genitourinary:   Urinary status: voiding and external catheter    Respiratory:      Chronic home O2 use?: YES  Incentive spirometer at bedside: YES       GI:     Current diet:  ADULT DIET; Regular; 3 carb choices (45 gm/meal)  Passing flatus: YES  Tolerating current diet: YES       Pain Management:   Patient states pain is manageable on current regimen: YES    Skin:     Interventions: turn team, specialty bed, float heels, increase time out of bed, internal/external urinary devices, and nutritional support    Patient Safety:  Fall Score:    Interventions: bed/chair alarm, assistive device (walker, cane. etc), gripper socks, and pt to call before getting OOB       Length of Stay:  Expected LOS: 2  Actual LOS: 0      Lincoln Brigham, LPN

## 2022-06-02 NOTE — Progress Notes (Signed)
End of Shift Note    Bedside shift change report given to  [place] (oncoming nurse) by Garnet Koyanagi, LPN (offgoing nurse).  Report included the following information SBAR, Kardex, Intake/Output, and MAR    Shift worked:  0730-2000     Shift summary and any significant changes:    Patient tolerated medication   Tolerated being placed on bedside commode   5L   EKG   Hepatobiliary scan      Concerns for physician to address: None    Zone phone for oncoming shift:  None        Activity:     Number times ambulated in hallways past shift: 0  Number of times OOB to chair past shift: 2    Cardiac:   Cardiac Monitoring: Yes           Access:  Current line(s): PIV     Genitourinary:   Urinary status: voiding    Respiratory:      Chronic home O2 use?: YES  Incentive spirometer at bedside: YES       GI:     Current diet:  ADULT DIET; Regular; 3 carb choices (45 gm/meal)  Passing flatus: YES  Tolerating current diet: YES       Pain Management:   Patient states pain is manageable on current regimen: YES    Skin:     Interventions: turn team, specialty bed, float heels, increase time out of bed, and foam dressing    Patient Safety:  Fall Score:    Interventions: bed/chair alarm, assistive device (walker, cane. etc), gripper socks, and pt to call before getting OOB       Length of Stay:  Expected LOS: 1  Actual LOS: [place], LPN

## 2022-06-02 NOTE — ED Notes (Addendum)
ED TO INPATIENT SBAR HANDOFF    Patient Name: Allison Newman   DOB:  [birth year]  82 y.o.   MRN:  [id number]  ED Room #:  ER22/22  Family/Caregiver Present no   Restraints no   Sitter no   Sepsis Risk Score Sepsis Risk Score: 1.01    Situation  Code Status: Full Code     Allergies: Almond oil, Macadamia nut oil, and Other  Weight: Patient Vitals for the past 96 hrs (Last 3 readings):   Weight   06/01/22 2120 129.3 kg (285 lb)     Arrived from: home  Chief Complaint:   Chief Complaint   Patient presents with    Chest Pain     Hospital Problem/Diagnosis:  Principal Problem:    Cholestasis  Resolved Problems:    * No resolved hospital problems. *    Imaging:   US ABDOMEN LIMITED Specify organ? GALLBLADDER   Final Result   Heterogeneous echogenicity of the liver with slightly irregular   border suggesting possible early cirrhotic change or other diffuse   hepatocellular process gallbladder sludge with tenderness but no other   ultrasonographic signs of cholecystitis..        Abnormal labs:   Abnormal Labs Reviewed   CBC WITH AUTO DIFFERENTIAL - Abnormal; Notable for the following components:       Result Value    RDW 16.2 (*)     Platelets 113 (*)     All other components within normal limits   COMPREHENSIVE METABOLIC PANEL - Abnormal; Notable for the following components:    Potassium 3.4 (*)     Glucose 141 (*)     BUN 23 (*)     Creatinine 1.28 (*)     Est, Glom Filt Rate 43 (*)     Total Bilirubin 1.6 (*)     Albumin 3.3 (*)     Globulin 4.1 (*)     Albumin/Globulin Ratio 0.8 (*)     All other components within normal limits         Abnormal Assessment Findings: chest pain, gallbladder sludge in ultrasound, generalized weakness, 5L NC @baseline      Background  History:   Past Medical History:   Diagnosis Date    Abdominal pain 05/08/2013    Abnormal finding on EKG 11/17/2013    ACP (advance care planning) 05/03/2015    Acute pulmonary embolism ([place]) 06/08/2017    Arthritis     Chronic kidney disease (CKD), stage II (mild)  03/02/2016    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    Chronic pain of left knee 08/13/2014    Chronic right hip pain 03/17/2013    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    Diabetes ([place])     Elevated BUN 09/13/2011    Finger lesion 03/02/2016    Gallbladder polyp 04/08/2013    Gastrointestinal disorder     GERD    GERD (gastroesophageal reflux disease)     Hypercholesterolemia     Hypertension     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    Microalbuminuria 08/29/2013    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, adult ([place]) 08/28/2011    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    On aspirin at home 05/03/2015    Other unknown and unspecified cause of morbidity or mortality     hx of bronchitis    Rash, skin 09/25/2012    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[place]) 03/02/2016  Slow transit constipation [id number]    Umbilical hernia [id number]       Assessment    Vitals/MEWS:    Level of Consciousness: Alert (0)   Vitals:    06/01/22 2120 06/01/22 2122 06/01/22 2126   BP: 131/75     Pulse: 81     Resp:  16    Temp: 97.5 F (36.4 C)     TempSrc: Oral     SpO2: (!) 84%  (!) 88%   Weight: 129.3 kg (285 lb)     Height: 1.6 m (5\' 3" )       DI:   Predictive Model Details          23 (Normal)  Factor Value    Calculated 06/02/2022 01:51 85% Age 27 years old    Deterioration Index Model 23% Pulse oximetry 88 %     5% Systolic [id number]     4% BUN abnormal (23 MG/DL)     4% Potassium abnormal (3.4 mmol/L)     2% Platelet count abnormal (113 K/uL)     1% WBC count 4.2 K/uL     1% Pulse 81     0% Temperature 97.5 F (36.4 C)     0% Sodium 140 mmol/L     0% Hematocrit 41.3 %     0% Respiratory rate 16       FiO2 (%): none  O2 Flow Rate: Nasal cannula @ 6L   Cardiac Rhythm: NSR @ 70's   Pain Scale: Pain Assessment  Pain Assessment: 0-10  Pain Level: 10  Pain Location: Chest  Pain Orientation: Mid  Pain Descriptors: Sharp  Functional Pain Assessment: Prevents or interferes some active activities and  ADLs  Pain Frequency: Intermittent  Last documented pain score (0-10 scale) Pain Level: 10  Last documented pain medication administered: pain meds will be given to pt prior to transfer upstairs     Mental Status: oriented and alert  Orientation Level:    NIH Score:    C-SSRS: Risk of Suicide: No Risk  Bedside swallow:    Glasgow Coma Scale (GCS): Glasgow Coma Scale  Eye Opening: Spontaneous  Best Verbal Response: Oriented  Best Motor Response: Obeys commands  Glasgow Coma Scale Score: 15  Active LDA's:    PO Status: Nothing by Mouth  Pertinent or High Risk Medications/Drips: no   If Yes, please provide details: none  Titratable drips: no   Pending Blood Product Administration: no   Mobility: limited transfer mobility pt needs max assist to get out of bed  ED Fall Risk: Presents to emergency department  because of falls (Syncope, seizure, or loss of consciousness): No, Age > 2: No, Altered Mental Status, Intoxication with alcohol or substance confusion (Disorientation, impaired judgment, poor safety awaremess, or inability to follow instructions): No, Impaired Mobility: Ambulates or transfers with assistive devices or assistance; Unable to ambulate or transer.: No     You may also review the ED PT Care Timeline found under the Summary Nursing Index tab.    Recommendation    Pending orders none  Consults ordered: IP CONSULT TO GENERAL SURGERY  IP CONSULT TO HOSPITALIST    Consulted provider:      Plan for next 24 hours: potential procedure to treat cholestasis  Additional Comments:     If any further questions, please call Sending RN at [phone]  Electronically signed by: Electronically signed by Debria Garret, RN on 06/02/2022 at 1:51 AM

## 2022-06-02 NOTE — Plan of Care (Signed)
Problem: Discharge Planning  Goal: Discharge to home or other facility with appropriate resources  Outcome: Progressing

## 2022-06-02 NOTE — Progress Notes (Signed)
Occupational Therapy  06/02/22    Orders rec'd and chart review completed in preparation for OT eval. Per PT and RN, "Patient currently with increasing O2 requirements (up to 5-6L) and satting 88-90% at rest. Also awaiting transfer off unit for HIDA scan. RN requesting therapy hold until respiratory status stabilizes." Will follow up for OT eval as able and appropriate.     Thank you  Winona Legato, OTR/L

## 2022-06-02 NOTE — Consults (Signed)
[facility]  Inpatient Consultation        Admit Date: 06/01/2022  Reason for Consultation: Elevated bilirubin, tenderness on right upper quadrant ultrasound, gallbladder sludge on ultrasound     HPI:  Allison Newman is a 82 y.o. female  with past medical history significant for whom morbid obesity, htn, niddm ii, dyslipidemia, gerd, chronic pain whom we are asked to see in consultation by Dr. Lester Carolina for the above complaint.    Pt states she presented to [facility] ED with complaints of left chest wall pain. States pain has been ongoing for over a week now.  Describes pain as an intermittent "pulling" pain. Movement aggravates pain and relieved with rest. Patient was seen in ED on Tuesday and discharged home after cardiac workup found negative.  Her symptoms, however, persisted with prompted her to return to ED.  Pt denies any abdominal pain or nausea/vomiting.  Denies any fever or chills.  She endorses a >50 lbs unintentional weight loss.  States over the past 6 month she has experienced a decrease in appetite due to a "bitter taste" when she eats.  +hx of reflux and ulcer and takes daily Protonix.  Pt states she has hx of chronic constipation, however, did have a soft, regular, non bloody bowel movement on Wednesday.  Abdominal ultrasound, CT abd/pelvis and HIDA scans all noted below. Labs reviewed.     Abd ultrasound   Heterogeneous echogenicity of the liver with slightly irregular  border suggesting possible early cirrhotic change or other diffuse  hepatocellular process gallbladder sludge with tenderness but no other  ultrasonographic signs of cholecystitis.    CT abd/pelvis 05/30/22  No acute findings or findings to correlate with weight loss.     HIDA 06/02/22  1. Normal gallbladder Filling. No Biliary Obstruction.  2. 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    Chronic kidney disease (CKD), 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    ACP (advance care planning) 05/03/2015    Osteopenia 05/03/2015    Slow transit constipation 08/13/2014    Chronic pain of left knee 08/13/2014    Abnormal finding on EKG 11/17/2013    Microalbuminuria [id number]    Umbilical hernia [id number]    Gallbladder polyp 04/08/2013    Chronic right hip pain 03/17/2013    Rash in adult 09/25/2012    Thyroid goiter 09/25/2012    Lung nodule seen on imaging study 09/25/2012    Hypercholesterolemia 05/29/2012    Acid reflux 01/15/2012    Low TSH level 09/13/2011    Essential hypertension 08/28/2011    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     Past Medical History:   Diagnosis Date    Abdominal pain 05/08/2013    Abnormal finding on EKG 11/17/2013    ACP (advance care planning) 05/03/2015    Acute pulmonary embolism ([place]) 06/08/2017    Arthritis     Chronic kidney disease (CKD), 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    Chronic pain of left knee 08/13/2014    Chronic right hip pain 03/17/2013    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    Diabetes ([place])     Elevated BUN 09/13/2011    Finger lesion 03/02/2016    Gallbladder polyp 04/08/2013    Gastrointestinal disorder     GERD    GERD (gastroesophageal reflux disease)     Hypercholesterolemia     Hypertension     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    Microalbuminuria 08/29/2013    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, adult ([place]) 08/28/2011    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    On aspirin at home  05/03/2015    Other unknown and unspecified cause of morbidity or mortality     hx of bronchitis    Rash, skin 09/25/2012    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[place]) 03/02/2016    Slow transit constipation Q000111Q    Umbilical hernia [id number]      Past Surgical History:   Procedure Laterality Date    COLONOSCOPY N/A 04/23/2018    COLONOSCOPY AND ESOPHAGOGASTRODUODENOSCOPY (EGD) performed by Marlise Eves., MD at [place]    screw in foot left    ORTHOPEDIC SURGERY  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    OTHER SURGICAL HISTORY      colonoscopy    TUBAL LIGATION        Social History     Tobacco Use    Smoking status: Former     Current packs/day: 0.00     Types: Cigarettes     Quit date: 06/03/1991     Years since quitting: 31.0    Smokeless tobacco: Never   Substance Use Topics    Alcohol use: No     Alcohol/week: 0.0 standard drinks of alcohol      Family History   Problem Relation Age of Onset    Heart Disease Mother         rheumatic fever    Diabetes Mother     Cancer Brother         throat    Breast Cancer Sister     Cancer Sister         colon & breast    Breast Cancer Mother     Cancer Father         lung      Prior to Admission medications    Medication Sig Start Date End Date Taking? Authorizing Provider   dicyclomine (BENTYL) 20 MG tablet Take 1 tablet by mouth 4 times daily for 7 days  Patient not taking: Reported on 06/02/2022 05/31/22 06/07/22  Jodi Marble, MD   Lancets MISC Use BID. Dx: E11.9 03/27/17   Automatic Reconciliation, Ar   aspirin 81 MG EC tablet Take 1 tablet by mouth daily 05/03/15   Automatic Reconciliation, Ar   calcium carb-cholecalciferol 600-10 MG-MCG TABS per tab take 1 tablet by mouth twice a day 09/18/16   Automatic Reconciliation, Ar   fluticasone furoate-vilanterol (BREO ELLIPTA) 200-25 MCG/ACT AEPB inhaler INHALE 1 PUFF BY MOUTH DAILY  Patient not taking: Reported on 06/02/2022 08/19/18   Automatic Reconciliation, Ar   furosemide (LASIX) 40 MG tablet  Take 1 tablet by mouth daily 12/16/19   Automatic Reconciliation, Ar   lisinopril (PRINIVIL;ZESTRIL) 10 MG tablet TAKE 1 TABLET EVERY DAY  Patient not taking:  Reported on 06/02/2022 10/30/17   Automatic Reconciliation, Ar   metFORMIN (GLUCOPHAGE) 1000 MG tablet TAKE 1 TABLET TWICE DAILY WITH MEALS 09/13/18   Automatic Reconciliation, Ar   methIMAzole (TAPAZOLE) 5 MG tablet TAKE 1 TABLET EVERY DAY 08/16/18   Automatic Reconciliation, Ar   metoprolol tartrate (LOPRESSOR) 50 MG tablet TAKE 1 TABLET TWICE DAILY 09/23/18   Automatic Reconciliation, Ar   nystatin (MYCOSTATIN) 100000 UNIT/GM powder Apply to affected area TID PRN  Patient not taking: Reported on 06/02/2022 03/08/18   Automatic Reconciliation, Ar   oxyCODONE 5 MG capsule Take 2 capsules by mouth every 4 hours as needed.    Automatic Reconciliation, Ar   pantoprazole (PROTONIX) 40 MG tablet Take 1 tablet by mouth daily 04/23/18   Automatic Reconciliation, Ar   rosuvastatin (CRESTOR) 20 MG tablet TAKE 1 TABLET EVERY NIGHT 09/13/18   Automatic Reconciliation, Ar   triamterene-hydroCHLOROthiazide (DYAZIDE) 37.5-25 MG per capsule TAKE 1 CAPSULE EVERY DAY 10/30/17   Automatic Reconciliation, Ar     Current Facility-Administered Medications   Medication Dose Route Frequency    sodium chloride flush 0.9 % injection 5-40 mL  5-40 mL IntraVENous 2 times per day    sodium chloride flush 0.9 % injection 5-40 mL  5-40 mL IntraVENous PRN    0.9 % sodium chloride infusion   IntraVENous PRN    potassium chloride (KLOR-CON) extended release tablet 40 mEq  40 mEq Oral PRN    Or    potassium bicarb-citric acid (EFFER-K) effervescent tablet 40 mEq  40 mEq Oral PRN    Or    potassium chloride 10 mEq/100 mL IVPB (Peripheral Line)  10 mEq IntraVENous PRN    magnesium sulfate 2000 mg in 50 mL IVPB premix  2,000 mg IntraVENous PRN    ondansetron (ZOFRAN-ODT) disintegrating tablet 4 mg  4 mg Oral Q8H PRN    Or    ondansetron (ZOFRAN) injection 4 mg  4 mg IntraVENous Q6H PRN    polyethylene  glycol (GLYCOLAX) packet 17 g  17 g Oral Daily PRN    acetaminophen (TYLENOL) tablet 650 mg  650 mg Oral Q6H PRN    Or    acetaminophen (TYLENOL) suppository 650 mg  650 mg Rectal Q6H PRN    0.9 % sodium chloride infusion   IntraVENous Continuous    aspirin EC tablet 81 mg  81 mg Oral Daily    furosemide (LASIX) tablet 40 mg  40 mg Oral Daily    methIMAzole (TAPAZOLE) tablet 5 mg  5 mg Oral Daily    metoprolol tartrate (LOPRESSOR) tablet 50 mg  50 mg Oral BID    pantoprazole (PROTONIX) tablet 40 mg  40 mg Oral Daily    rosuvastatin (CRESTOR) tablet 20 mg  20 mg Oral Nightly    triamterene-hydroCHLOROthiazide (MAXZIDE-25) 37.5-25 MG per tablet 1 tablet  1 tablet Oral Daily    arformoterol 15 mcg-budesonide 0.5 mg neb solution   Nebulization BID RT     Allergies   Allergen Reactions    Almond Oil Anaphylaxis, Hives and Itching    Macadamia Nut Oil Anaphylaxis    Other Anaphylaxis     All tree nuts           Subjective:     Review of Systems:    A comprehensive review of systems was negative except for that written in the History of Present Illness.       Objective:     Blood pressure 122/88, pulse 67, temperature 98.1 F (  36.7 C), temperature source Oral, resp. rate 16, height 1.6 m (5\' 3" ), weight 129.3 kg (285 lb), SpO2 90 %.  Temp (24hrs), Avg:97.9 F (36.6 C), Min:97.5 F (36.4 C), Max:98.1 F (36.7 C)      Recent Labs     05/30/22  2143 06/01/22  2201   WBC 4.0 4.2   HGB 13.9 14.1   HCT 40.7 41.3   PLT 107* 113*     Recent Labs     05/30/22  2143 06/01/22  2201   NA 136 140   K 3.2* 3.4*   CL 102 106   CO2 24 24   BUN 26* 23*   MG 1.9 2.2   ALT 89* 44     No results for input(s): "AML" in the last 72 hours.    Invalid input(s): "LPSE"      Intake/Output Summary (Last 24 hours) at 06/02/2022 1247  Last data filed at 06/02/2022 0732  Gross per 24 hour   Intake 438.68 ml   Output --   Net 438.68 ml     Date 06/02/22 0000 - 06/02/22 2359   Shift 0000-0759 0800-1559 1600-2359 24 Hour Total   INTAKE   I.V.(mL/kg/hr)  8.1(0)   8.1   IV Piggyback 430.6   430.6   Shift Total(mL/kg) 438.7(3.4)   438.7(3.4)   OUTPUT   Shift Total(mL/kg)       Weight (kg) 129.3 129.3 129.3 129.3     _____________________  Physical Exam:     General:  Alert, cooperative, no distress, appears stated age.   Eyes:   Sclera clear.   Throat: Lips, mucosa, and tongue normal.   Neck: Supple, symmetrical, trachea midline.   Lungs:   Diminished anteriorly, on chronic home O2 5L NC   Heart:  Regular rate and rhythm.   Abdomen:   Obese, Normal BS, abd soft, non-tender. No guarding or rebound.    Extremities: Extremities normal, atraumatic, no cyanosis or edema.   Skin: Skin color, texture, turgor normal. No rashes or lesions.             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  Pt can make an appt with provider for Robotic cholecystectomy as an outpatient.   Continue with low fat diet   Analgesics and antiemetics PRN   D/W Dr. Janace Hoard of care per primary team      Thank you for allowing [place] to participate in the care of this patient.     Total time spent with patient: 30 minutes.    Signed By: Herma Carson, APRN - NP     June 02, 2022        Patient seen and examined  Agree with above  Patient denies any abdominal pain with eating.  Has been on a regular diet.  States that her pain is with movement and in her left chest.  Also complains of some reflux symptoms.  While she does have sludge on her HIDA scan and chronic cholecystitis her symptoms are not consistent with gallbladder disease.  No plans for operative intervention at this time.  If she does become symptomatic she should make an appointment in our office.  Otherwise we will sign off.

## 2022-06-02 NOTE — H&P (Signed)
History & Physical    Primary Care Provider: Marcina Millard, MD  Source of Information: Patient and chart review    History of Presenting Illness:   Allison Newman is a 82 y.o. female with pmh of morbid obesity, htn, niddm ii, dyslipidemia, gerd, chronic pain who presented to ed with complaints of chest wall pain. Reports near 1 week of severe, intermittent, sharp 8/10 left chest wall pain aggravated by certain arm movements and relieved with rest. Was seen in ed 25, discharged home.  His symptoms persisted; forcing her to return to emergency room.  The patient denies any fever, chills, abdominal pain, nausea, vomiting, cough, congestion, recent illness, palpitations, or dysuria.    Remarkable vitals on ER Presentation: vss  Labs Remarkable for: k-3.4  ER Images: Right upper quadrant ultrasound showed biliary sludge  ER Rx: None.     Review of Systems:  Pertinent items are noted in the History of Present Illness.     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    Diabetes ([place])     Elevated BUN 09/13/2011    Finger lesion 03/02/2016    Gallbladder polyp 04/08/2013    Gastrointestinal disorder     GERD    GERD (gastroesophageal reflux disease)     Hypercholesterolemia     Hypertension     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    Microalbuminuria 08/29/2013    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, adult ([place]) 08/28/2011    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    On aspirin at home 05/03/2015    Other unknown and unspecified cause of morbidity or mortality     hx of bronchitis    Rash, skin 09/25/2012     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 Authorizing Provider   dicyclomine (BENTYL) 20 MG tablet Take 1 tablet by mouth 4 times daily for 7 days 05/31/22 06/07/22  Jodi Marble, MD   Lancets MISC Use BID. Dx: E11.9 03/27/17   Automatic Reconciliation, Ar   aspirin 81 MG EC tablet Take 81 mg by mouth daily 05/03/15   Automatic Reconciliation, Ar   calcium carb-cholecalciferol 600-10 MG-MCG TABS per tab take 1 tablet by mouth twice a day 09/18/16   Automatic Reconciliation, Ar   fluticasone furoate-vilanterol (BREO ELLIPTA) 200-25 MCG/ACT AEPB inhaler INHALE 1 PUFF BY MOUTH DAILY 08/19/18  Automatic Reconciliation, Ar   furosemide (LASIX) 40 MG tablet Take 40 mg by mouth daily 12/16/19   Automatic Reconciliation, Ar   lisinopril (PRINIVIL;ZESTRIL) 10 MG tablet TAKE 1 TABLET EVERY DAY 10/30/17   Automatic Reconciliation, Ar   metFORMIN (GLUCOPHAGE) 1000 MG tablet TAKE 1 TABLET TWICE DAILY WITH MEALS 09/13/18   Automatic Reconciliation, Ar   methIMAzole (TAPAZOLE) 5 MG tablet TAKE 1 TABLET EVERY DAY 08/16/18   Automatic Reconciliation, Ar   metoprolol tartrate (LOPRESSOR) 50 MG tablet TAKE 1 TABLET TWICE DAILY 09/23/18   Automatic Reconciliation, Ar   nystatin (MYCOSTATIN) 100000 UNIT/GM powder Apply to affected area TID PRN 03/08/18   Automatic Reconciliation, Ar   oxyCODONE 5 MG capsule Take 5 mg by mouth every 4 hours as needed.    Automatic Reconciliation, Ar   pantoprazole (PROTONIX) 40 MG tablet Take 40 mg by  mouth daily 04/23/18   Automatic Reconciliation, Ar   rosuvastatin (CRESTOR) 20 MG tablet TAKE 1 TABLET EVERY NIGHT 09/13/18   Automatic Reconciliation, Ar   triamterene-hydroCHLOROthiazide (DYAZIDE) 37.5-25 MG per capsule TAKE 1 CAPSULE EVERY DAY 10/30/17   Automatic Reconciliation, Ar     Allergies   Allergen Reactions    Almond Oil Anaphylaxis, Hives and Itching    Macadamia Nut Oil Anaphylaxis    Other Anaphylaxis     All tree nuts       Family History   Problem Relation Age of Onset    Heart Disease Mother         rheumatic fever    Diabetes Mother     Cancer Brother         throat    Breast Cancer Sister     Cancer Sister         colon & breast    Breast Cancer Mother     Cancer Father         lung        SOCIAL HISTORY:  Patient resides:  Independently x   Assisted Living    SNF    With family care       Smoking history:   None x   Former    Chronic      Alcohol history:   None x   Social    Chronic      Ambulates:   Independently x   w/cane    w/walker    w/wc    CODE STATUS:  DNR    Full x   Other      Objective:     Physical Exam:     BP 131/75   Pulse 81   Temp 97.5 F (36.4 C) (Oral)   Resp 16   Ht 1.6 m (5\' 3" )   Wt 129.3 kg (285 lb)   SpO2 (!) 88%   BMI 50.49 kg/m         General:  Alert, cooperative, no distress, appears stated age.   Head:  Normocephalic, without obvious abnormality, atraumatic.   Eyes:  Conjunctivae/corneas clear. PERRL, EOMs intact.   Nose: Nares normal. Septum midline. Mucosa normal.        Neck: Supple, symmetrical, trachea midline.       Lungs:   Clear to auscultation bilaterally.   Chest wall:  Severe tenderness to palpation of left pectoralis major insertion.   Heart:  Regular rate and rhythm, S1, S2 normal   Abdomen:   Soft, non-tender. Bowel sounds normal. No masses,  No organomegaly.   Extremities: Extremities  normal, atraumatic, no cyanosis or edema.   Pulses: 2+ and symmetric all extremities.   Skin: Skin color, texture, turgor normal. No rashes or lesions    Neurologic: CNII-XII grossly intact.        Data Review:     Recent Days:  Recent Labs     05/30/22  2143 06/01/22  2201   WBC 4.0 4.2   HGB 13.9 14.1   HCT 40.7 41.3   PLT 107* 113*     Recent Labs     05/30/22  2143 06/01/22  2201   NA 136 140   K 3.2* 3.4*   CL 102 106   CO2 24 24   BUN 26* 23*   MG 1.9 2.2   ALT 89* 44     No results for input(s): "PH", "PCO2", "PO2", "HCO3", "FIO2" in the last 72 hours.    24 Hour Results:  Recent Results (from the past 24 hour(s))   Troponin    Collection Time: 06/01/22 10:00 PM   Result Value Ref Range    Troponin, High Sensitivity 16 0 - 51 ng/L   CBC with Auto Differential    Collection Time: 06/01/22 10:01 PM   Result Value Ref Range    WBC 4.2 3.6 - 11.0 K/uL    RBC 4.45 3.80 - 5.20 M/uL    Hemoglobin 14.1 11.5 - 16.0 g/dL    Hematocrit 41.3 35.0 - 47.0 %    MCV 92.8 80.0 - 99.0 FL    MCH 31.7 26.0 - 34.0 PG    MCHC 34.1 30.0 - 36.5 g/dL    RDW 16.2 (H) 11.5 - 14.5 %    Platelets 113 (L) 150 - 400 K/uL    MPV 12.2 8.9 - 12.9 FL    Nucleated RBCs 0.0 0 PER 100 WBC    nRBC 0.00 0.00 - 0.01 K/uL    Neutrophils % 68 32 - 75 %    Lymphocytes % 20 12 - 49 %    Monocytes % 9 5 - 13 %    Eosinophils % 2 0 - 7 %    Basophils % 1 0 - 1 %    Immature Granulocytes 0 0.0 - 0.5 %    Neutrophils Absolute 2.9 1.8 - 8.0 K/UL    Lymphocytes Absolute 0.8 0.8 - 3.5 K/UL    Monocytes Absolute 0.4 0.0 - 1.0 K/UL    Eosinophils Absolute 0.1 0.0 - 0.4 K/UL    Basophils Absolute 0.0 0.0 - 0.1 K/UL    Absolute Immature Granulocyte 0.0 0.00 - 0.04 K/UL    Differential Type AUTOMATED     CMP    Collection Time: 06/01/22 10:01 PM   Result Value Ref Range    Sodium 140 136 - 145 mmol/L    Potassium 3.4 (L) 3.5 - 5.1 mmol/L    Chloride 106 97 - 108 mmol/L    CO2 24 21 - 32 mmol/L    Anion Gap 10 5 - 15 mmol/L    Glucose 141 (H) 65 - 100 mg/dL    BUN 23 (H) 6 - 20 MG/DL    Creatinine 1.28 (H) 0.55 - 1.02 MG/DL    Bun/Cre Ratio 18 12 - 20      Est, Glom Filt Rate 43 (L) >60 ml/min/1.106m2    Calcium 9.2  8.5 - 10.1 MG/DL    Total Bilirubin 1.6 (H) 0.2 - 1.0 MG/DL    ALT 44 12 - 78 U/L  AST 27 15 - 37 U/L    Alk Phosphatase 81 45 - 117 U/L    Total Protein 7.4 6.4 - 8.2 g/dL    Albumin 3.3 (L) 3.5 - 5.0 g/dL    Globulin 4.1 (H) 2.0 - 4.0 g/dL    Albumin/Globulin Ratio 0.8 (L) 1.1 - 2.2     Lipase    Collection Time: 06/01/22 10:01 PM   Result Value Ref Range    Lipase 62 13 - 75 U/L   Magnesium    Collection Time: 06/01/22 10:01 PM   Result Value Ref Range    Magnesium 2.2 1.6 - 2.4 mg/dL   Extra Tubes Hold    Collection Time: 06/01/22 10:01 PM   Result Value Ref Range    Specimen HOld 1BLU     Comment:        Add-on orders for these samples will be processed based on acceptable specimen integrity and analyte stability, which may vary by analyte.         Imaging:     Assessment:     KATERI FONTAN is a 82 y.o. female with pmh of morbid obesity, htn, niddm ii, dyslipidemia, gerd, chronic pain who is admitted for acute cholestasis       Plan:       Cholestasis  -See right upper quadrant ultrasound findings  -Patient has tenderness to's positive Murphy's  -HIDA scan in a.m.  -General surgery on consult.  Appreciate recs    Chest wall pain /costochondritis  -Continue PTA as needed analgesics  -PT OT evaluation    HTN  -Continue on metoprolol AND DYAZIDE    Gerd / Dyslipidemia  -home meds    Obesity  -Counseled on weight loss, dieting and exercise            FEN/GI -  ns@75ml /hr  Activity - as tolerated  DVT prophylaxis - Lovenox  GI prophylaxis -  none indicated  Disposition - home    CODE STATUS:   full code       Signed By: Levon Hedger, MD     June 02, 2022

## 2022-06-03 LAB — COMPREHENSIVE METABOLIC PANEL
ALT: 26 U/L (ref 12–78)
AST: 13 U/L — ABNORMAL LOW (ref 15–37)
Albumin/Globulin Ratio: 0.7 — ABNORMAL LOW (ref 1.1–2.2)
Albumin: 2.8 g/dL — ABNORMAL LOW (ref 3.5–5.0)
Alk Phosphatase: 70 U/L (ref 45–117)
Anion Gap: 3 mmol/L — ABNORMAL LOW (ref 5–15)
BUN: 17 MG/DL (ref 6–20)
Bun/Cre Ratio: 16 (ref 12–20)
CO2: 26 mmol/L (ref 21–32)
Calcium: 8.8 MG/DL (ref 8.5–10.1)
Chloride: 108 mmol/L (ref 97–108)
Creatinine: 1.04 MG/DL — ABNORMAL HIGH (ref 0.55–1.02)
Est, Glom Filt Rate: 55 mL/min/{1.73_m2} — ABNORMAL LOW (ref >60)
Globulin: 3.8 g/dL (ref 2.0–4.0)
Glucose: 130 mg/dL — ABNORMAL HIGH (ref 65–100)
Potassium: 3.5 mmol/L (ref 3.5–5.1)
Sodium: 137 mmol/L (ref 136–145)
Total Bilirubin: 1.7 MG/DL — ABNORMAL HIGH (ref 0.2–1.0)
Total Protein: 6.6 g/dL (ref 6.4–8.2)

## 2022-06-03 LAB — CBC WITH AUTO DIFFERENTIAL
Absolute Immature Granulocyte: 0 10*3/uL (ref 0.00–0.04)
Basophils %: 1 % (ref 0–1)
Basophils Absolute: 0 10*3/uL (ref 0.0–0.1)
Eosinophils %: 3 % (ref 0–7)
Eosinophils Absolute: 0.1 10*3/uL (ref 0.0–0.4)
Hematocrit: 39.6 % (ref 35.0–47.0)
Hemoglobin: 13.4 g/dL (ref 11.5–16.0)
Immature Granulocytes: 0 % (ref 0.0–0.5)
Lymphocytes %: 24 % (ref 12–49)
Lymphocytes Absolute: 1.1 10*3/uL (ref 0.8–3.5)
MCH: 31.8 PG (ref 26.0–34.0)
MCHC: 33.8 g/dL (ref 30.0–36.5)
MCV: 94.1 FL (ref 80.0–99.0)
MPV: 12.9 FL (ref 8.9–12.9)
Monocytes %: 7 % (ref 5–13)
Monocytes Absolute: 0.3 10*3/uL (ref 0.0–1.0)
Neutrophils %: 65 % (ref 32–75)
Neutrophils Absolute: 2.9 10*3/uL (ref 1.8–8.0)
Nucleated RBCs: 0 PER 100 WBC
Platelets: 127 10*3/uL — ABNORMAL LOW (ref 150–400)
RBC: 4.21 M/uL (ref 3.80–5.20)
RDW: 16.1 % — ABNORMAL HIGH (ref 11.5–14.5)
WBC: 4.5 10*3/uL (ref 3.6–11.0)
nRBC: 0 10*3/uL (ref 0.00–0.01)

## 2022-06-03 LAB — POCT GLUCOSE
POC Glucose: 100 mg/dL (ref 65–117)
POC Glucose: 127 mg/dL — ABNORMAL HIGH (ref 65–117)
POC Glucose: 144 mg/dL — ABNORMAL HIGH (ref 65–117)

## 2022-06-03 MED ORDER — LIDOCAINE 4 % EX PTCH
4 | Freq: Every day | CUTANEOUS | 0 refills | Status: AC
Start: 2022-06-03 — End: 2022-06-11

## 2022-06-03 MED ORDER — MELOXICAM 7.5 MG PO TABS
7.5 | ORAL_TABLET | Freq: Every day | ORAL | 0 refills | Status: AC
Start: 2022-06-03 — End: 2022-06-13

## 2022-06-03 MED FILL — TRIAMTERENE-HCTZ 37.5-25 MG PO TABS: ORAL | Qty: 1

## 2022-06-03 MED FILL — LIDOCAINE PAIN RELIEF 4 % EX PTCH: 4 % | CUTANEOUS | Qty: 1

## 2022-06-03 MED FILL — METOPROLOL TARTRATE 50 MG PO TABS: 50 MG | ORAL | Qty: 1

## 2022-06-03 MED FILL — PANTOPRAZOLE SODIUM 40 MG PO TBEC: 40 MG | ORAL | Qty: 1

## 2022-06-03 MED FILL — ASPIRIN LOW DOSE 81 MG PO TBEC: 81 MG | ORAL | Qty: 1

## 2022-06-03 MED FILL — ACETAMINOPHEN 325 MG PO TABS: 325 MG | ORAL | Qty: 2

## 2022-06-03 MED FILL — METHIMAZOLE 5 MG PO TABS: 5 MG | ORAL | Qty: 1

## 2022-06-03 MED FILL — FUROSEMIDE 40 MG PO TABS: 40 MG | ORAL | Qty: 1

## 2022-06-03 MED FILL — ARFORMOTEROL TARTRATE 15 MCG/2ML IN NEBU: 15 MCG/2ML | RESPIRATORY_TRACT | Qty: 2

## 2022-06-03 MED FILL — MELOXICAM 7.5 MG PO TABS: 7.5 MG | ORAL | Qty: 1

## 2022-06-03 MED FILL — ROSUVASTATIN CALCIUM 10 MG PO TABS: 10 MG | ORAL | Qty: 2

## 2022-06-03 NOTE — Plan of Care (Signed)
Problem: Physical Therapy - Adult  Goal: By Discharge: Performs mobility at highest level of function for planned discharge setting.  See evaluation for individualized goals.  Description: FUNCTIONAL STATUS PRIOR TO ADMISSION: Patient was modified independent using a rolling walker for functional mobility, sleeps in a recliner, 5L O2 as needed, no falls, recently resumed lymphedema treatments at [facility] OP PT clinic, uses a WC from the car to the OP PT clinic.    HOME SUPPORT PRIOR TO ADMISSION: The patient lived with her brother and cousin on the first floor of a story home w/ level entry.Marland Kitchen    Physical Therapy Goals  Initiated 06/03/2022  1.  Patient will move from supine to sit and sit to supine in bed with modified independence within 7 day(s).    2.  Patient will perform sit to stand with modified independence within 7 day(s).  3.  Patient will ambulate with modified independence for 50 feet with the least restrictive device within 7 day(s).       Outcome: Progressing     PHYSICAL THERAPY EVALUATION    Patient: Allison Newman [id number] y.o. female)  Date: 06/03/2022  Primary Diagnosis: Biliary sludge 123XX123  Biliary colic 99991111  Cholestasis [K83.1]  Chronic obstructive pulmonary disease, unspecified COPD type ([place]) [J44.9]  Chest pain, unspecified type [R07.9]       Precautions:  Fall Risk                      ASSESSMENT :   The patient's ability to ambulate household distances is most limited by activity tolerance. She presents today requiring continuous oxygen, generalized weakness, decreased endurance, BLE edema, SOB w/ light activity, and decreased endurance.  At baseline pt ambulates w/ a RW, sleeps in a recliner, uses 5L NCO2 PRN, and open to [facility] OP PT for lymphedema therapy.    Received sitting in a chair on 4L NCO2, SpO2 >89%.  She is overall SBA transitioning positions and ambulating with a RW.  Ambulated 40 ft with numerous standing rest breaks required d/t fatigue, SOB, and brief drops in O2 sat to 86%  w/ unreliable pleth. She recovers w/ self directed standing rest breaks with forward lean resting forearms on the walker.  Pt endorses this is her baseline though acknowledges being weaker, having increased SOB and increased fatigue with less activity.   She will benefit from therapy to address the above impairments.  Will follow.     Functional Outcome Measure:  The patient scored 17/24 on the AMPAC outcome measure which is indicative of discharging home with home health or need of SNF/IPR.Marland Kitchen           PLAN :  Recommendations and Planned Interventions:   bed mobility training, transfer training, gait training, therapeutic exercises, patient and family training/education, and therapeutic activities    Frequency/Duration: Patient will be followed by physical therapy 5 times/week to address goals.    Recommendation for discharge: (in order for the patient to meet his/her long term goals): Therapy 2 days/week in the home    Other factors to consider for discharge: no additional factors    IF patient discharges home will need the following DME: patient owns DME required for discharge         SUBJECTIVE:   Patient stated "I'll decide later." (re: needing HHPT)    OBJECTIVE DATA SUMMARY:       Past Medical History:   Diagnosis Date    Abdominal pain 05/08/2013  Abnormal finding on EKG 11/17/2013    ACP (advance care planning) 05/03/2015    Acute pulmonary embolism ([place]) 06/08/2017    Arthritis     Chronic kidney disease (CKD), 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    Chronic pain of left knee 08/13/2014    Chronic right hip pain 03/17/2013    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    Diabetes ([place])     Elevated BUN 09/13/2011    Finger lesion 03/02/2016    Gallbladder polyp 04/08/2013    Gastrointestinal disorder     GERD    GERD (gastroesophageal reflux disease)     Hypercholesterolemia     Hypertension     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    Microalbuminuria 08/29/2013     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, adult ([place]) 08/28/2011    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    On aspirin at home 05/03/2015    Other unknown and unspecified cause of morbidity or mortality     hx of bronchitis    Rash, skin 09/25/2012    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[place]) 03/02/2016    Slow transit constipation [id number]    Umbilical hernia [id number]     Past Surgical History:   Procedure Laterality Date    COLONOSCOPY N/A 04/23/2018    COLONOSCOPY AND ESOPHAGOGASTRODUODENOSCOPY (EGD) performed by Marlise Eves., MD at [facility]    screw in foot left    ORTHOPEDIC SURGERY  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    OTHER SURGICAL HISTORY      colonoscopy    TUBAL LIGATION         Home Situation:  Social/Functional History  Lives With: Family (grandson, brother)  Type of Home: House  Home Layout: Two level, Able to Live on Main level with bedroom/bathroom  Home Access: Level entry  Bathroom Shower/Tub:  (sponge bathes)  Bathroom Toilet: Handicap height  Home Equipment: Walker, rolling, Cane  Has the patient had two or more falls in the past year or any fall with injury in the past year?: No  ADL Assistance: Independent  Homemaking Assistance: Needs assistance  Ambulation Assistance: Independent  Transfer Assistance: Independent  Active Driver: No    Cognitive/Behavioral Status:  Orientation  Overall Orientation Status: Within Normal Limits  Orientation Level: Oriented X4  Cognition  Overall Cognitive Status: WNL    Skin: Exposed areas grossly intact    Edema: BLE    Hearing:   Grossly intact            Strength:    Strength: Generally decreased, functional    Tone & Sensation:   Tone: Normal       Coordination:  Coordination: Within functional limits    Range Of Motion:  AROM: Generally decreased, functional       Functional Mobility:  Bed Mobility:     Bed Mobility Training  Bed Mobility Training: No (Received in the chair.  Returned to chair after  session.  Pt endorses staff provided some assistance getting OOB, sleeps in a recliner in the home)  Transfers:     Transfer Training  Transfer Training: Yes  Interventions: Verbal cues (for technique/ hand placement to improve safety)  Sit to Stand: Stand-by assistance  Stand to Sit: Stand-by assistance  Balance:               Balance  Sitting: Intact  Standing: Impaired  Standing - Static: Good;Constant support  Standing - Dynamic: Good;Constant support  Ambulation/Gait Training:                       Gait  Gait Training: Yes  Overall Level of Assistance: Stand-by assistance;Assist X1;Adaptive equipment;Additional time (for O2 tank management)  Distance (ft): 40 Feet (Numerous standing rest breaks w/ forward lean and forearms resting on walker frame.  Discussed fall risk with this. Pt indicates this is baseline, denies fall history)  Assistive Device: Gait belt;Walker, rolling  Interventions: Safety awareness training  Base of Support: Widened  Speed/Cadence: Slow  Step Length: Left shortened;Right shortened  Gait Abnormalities: Decreased step clearance                                                                                                                                                                                                                                                          [employer] [facility]	[place] AM-PAC      Basic Mobility Inpatient Short Form (6-Clicks) Version 2  How much HELP from another person do you currently need... (If the patient hasn't done an activity recently, how much help from another person do you think they would need if they tried?) Total A Lot A Little None   1.  Turning from your back to your side while in a flat bed without using bedrails? (Inferred based on discussions) []   1 []   2 [x]   3  []   4   2.  Moving from lying on your back to sitting on the side of a flat bed without using bedrails? (Inferred based on discussions) []   1 []   2 [x]   3  []   4   3.  Moving to and from  a bed to a chair (including a wheelchair)? []   1 []   2 [x]   3  []   4   4. Standing up from a chair using your arms (e.g. wheelchair or bedside chair)? []   1 []   2 [x]   3  []   4   5.  Walking in hospital room? []   1 []   2 [x]   3  []   4   6.  Climbing 3-5 steps with a railing? (Inferred) []   1 [x]   2 []   3  []   4     Raw Score: 17/24                            Cutoff score ?171,2,3 had higher odds of discharging home with home health or need of SNF/IPR.    [place], Jon Gills, Vinoth Debbe Bales, Mena Goes Passek, Roslynn Amble. Annabell Howells.  Validity of the AM-PAC "6-Clicks" Inpatient Daily Activity and Basic Mobility Short Forms. Physical Therapy Mar 2014, 94 (3) 379-391; DOI: 10.2522/ptj.20130199  2. Raynelle Chary. Association of AM-PAC "6-Clicks" Basic Mobility and Daily Activity Scores With Discharge Destination. Phys Ther. 2021 Apr 4;101(4):pzab043. doi: 10.1093/ptj/pzab043. PMID: [id number].  [place], Rajaraman D, Tanna Furry, Agayby K, Oral S. Activity Measure for Post-Acute Care "6-Clicks" Basic Mobility Scores Predict Discharge Destination After Acute Care Hospitalization in Select Patient Groups: A Retrospective, Observational Study. Arch Rehabil Res Clin Transl. 2022 Jul 16;4(3):100204. doi: 10.1016/j.arrct.DB:6867004. PMID: [id number]; PMCIDWX:9587187.  4. Vonzell Schlatter, Coster W, Ni P. AM-PAC Short Forms Manual 4.0. Revised 04/2018.                                                                                                                                                                                                                              Pain Rating:  None indicated  Pain Intervention(s):   pain is at a level acceptable to the patient    Activity Tolerance:   Fair , requires frequent rest breaks, observed shortness of breath on exertion, and desaturates with activity and requires oxygen    After treatment:   Patient left in no apparent  distress sitting up in chair, Call bell within reach, Bed/ chair alarm activated, and VSS on 4L NCO2    COMMUNICATION/EDUCATION:   The patient's plan of care was discussed with: occupational therapist and registered nurse    Patient Education  Education Given To: Patient  Education Provided: Role of Therapy;Plan of Glass blower/designer;Fall Prevention Strategies;Energy Conservation  Education Method: Verbal  Barriers to Learning: None  Education Outcome: Verbalized understanding    Thank you for this referral.  Arnette Felts, PT  Minutes: 24      Physical Therapy Evaluation Charge Determination   History Examination Presentation Decision-Making   MEDIUM  Complexity : 1-2 comorbidities / personal factors will impact the outcome/  POC  LOW Complexity : 1-2 Standardized tests and measures addressing body structure, function, activity limitation and / or participation in recreation  LOW Complexity : Stable, uncomplicated  AM-PAC  LOW    Based on the above components, the patient evaluation is determined to be of the following complexity level: Low

## 2022-06-03 NOTE — Plan of Care (Signed)
Problem: Discharge Planning  Goal: Discharge to home or other facility with appropriate resources  Outcome: Progressing

## 2022-06-03 NOTE — Discharge Summary (Cosign Needed)
Discharge Summary       PATIENT ID: Allison Newman  MRN: [id number]   DATE OF BIRTH: 1941/03/06    DATE OF ADMISSION: 06/01/2022  9:27 PM    DATE OF DISCHARGE: 06/03/2022   PRIMARY CARE PROVIDER: Marcina Millard, MD     ATTENDING PHYSICIAN: Toy Baker, MD  DISCHARGING PROVIDER: Caren Macadam, PA-C    To contact this individual call [phone] and ask the operator to page.  If unavailable ask to be transferred the Adult Hospitalist Department.    CONSULTATIONS: IP CONSULT TO GENERAL SURGERY  IP CONSULT TO HOSPITALIST    PROCEDURES/SURGERIES: * No surgery found *     ADMITTING [place] COURSE:   Allison Newman is a 82 y.o. female with pmh of morbid obesity, htn, niddm ii, dyslipidemia, gerd, chronic pain who presented to ed with complaints of chest wall pain. Reports near 1 week of severe, intermittent, sharp 8/10 left chest wall pain aggravated by certain arm movements and relieved with rest. Was seen in ed 25, discharged home.  His symptoms persisted; forcing her to return to emergency room.  The patient denies any fever, chills, abdominal pain, nausea, vomiting, cough, congestion, recent illness, palpitations, or dysuria.     Remarkable vitals on ER Presentation: vss  Labs Remarkable for: k-3.4  ER Images: Right upper quadrant ultrasound showed biliary sludge  ER Rx: None.        DISCHARGE DIAGNOSES / PLAN:      Cholestasis  - HIDA scan pending: diminshed gallbladder EF, no acute cholecystitis  - General surgery saw the patient, no intervention at this time, may follow up outpatient  - LFTS WNL     COPD  - Patient on wixela at home. Per [facility] notes she does wear supplemental O2 PRN, but it is unclear how much. It seems that her SpO2 improved with ambulation at [facility] which may be suggestive of restrictive etiology.   - Patient with no respiratory complaints today  - Continue home scheduled and PRN nebs  - Supplemental O2 prn for SpO2 > 88%       Chest wall pain /costochondritis  - Will discharge  with short course of meloxicam  - The patient will follow up with her PCP for renal function monitoring     HTN  -Continue on metoprolol and HCTZ-triamterene     Gerd   - PPI     Dyslipidemia  - Continue Statin      Obesity  -Counseled on weight loss, dieting and exercise      Hyperthyroidism  - Continue methimazole       PENDING TEST RESULTS:   At the time of discharge the following test results are still pending: None    FOLLOW UP APPOINTMENTS:    Follow-up Information       Follow up With Specialties Details Why Contact Info    Lamount Cranker, MD General Surgery, Bariatrics Schedule an appointment as soon as possible for a visit Make an appt to discuss gallbladder removal [address]  [address]  [place] [address]  [phone]                ADDITIONAL CARE RECOMMENDATIONS:   Follow up with your PCP for management of chronic conditions  Obtain a blood pressure cuff and check your blood pressure twice a day. If the systolic BP (top number) is 120 or less, do not take blood pressure medicines  For lymphedema, obtain compression stockings  for the legs and thighs    DIET: diabetic diet    ACTIVITY: activity as tolerated    WOUND CARE: N/A    EQUIPMENT needed: N/A      DISCHARGE MEDICATIONS:     Medication List        START taking these medications      lidocaine 4 % external patch  Place 1 patch onto the skin daily for 7 days  Start taking on: June 04, 2022     meloxicam 7.5 MG tablet  Commonly known as: MOBIC  Take 1 tablet by mouth daily for 10 days            CONTINUE taking these medications      aspirin 81 MG EC tablet     calcium carb-cholecalciferol 600-10 MG-MCG Tabs per tab     furosemide 40 MG tablet  Commonly known as: LASIX     Lancets Misc     metFORMIN 1000 MG tablet  Commonly known as: GLUCOPHAGE     methIMAzole 5 MG tablet  Commonly known as: TAPAZOLE     metoprolol tartrate 50 MG tablet  Commonly known as: LOPRESSOR     pantoprazole 40 MG tablet  Commonly known as: PROTONIX     rosuvastatin 20 MG  tablet  Commonly known as: CRESTOR     triamterene-hydroCHLOROthiazide 37.5-25 MG per capsule  Commonly known as: DYAZIDE            STOP taking these medications      dicyclomine 20 MG tablet  Commonly known as: BENTYL     nystatin 100000 UNIT/GM powder  Commonly known as: MYCOSTATIN     oxyCODONE 5 MG capsule            ASK your doctor about these medications      Breo Ellipta 200-25 MCG/ACT Aepb inhaler  Generic drug: fluticasone furoate-vilanterol     lisinopril 10 MG tablet  Commonly known as: PRINIVIL;ZESTRIL               Where to Get Your Medications        These medications were sent to [facility] [address], [place] [phone] Wanda Plump [phone]  [place], [place] [address]      Phone: [phone]   lidocaine 4 % external patch  meloxicam 7.5 MG tablet           NOTIFY YOUR PHYSICIAN FOR ANY OF THE FOLLOWING:   Fever over 101 degrees for 24 hours.   Chest pain, shortness of breath, fever, chills, nausea, vomiting, diarrhea, change in mentation, falling, weakness, bleeding. Severe pain or pain not relieved by medications.  Or, any other signs or symptoms that you may have questions about.    DISPOSITION:   x Home With:   OT  PT  HH  RN       Long term SNF/Inpatient Rehab    Independent/assisted living    Hospice    Other:       PATIENT CONDITION AT DISCHARGE:     Functional status    Poor     Deconditioned    x Independent      Cognition    x Lucid     Forgetful     Dementia      Catheters/lines (plus indication)    Foley     PICC     PEG    x None  Code status    x Full code     DNR      PHYSICAL EXAMINATION AT DISCHARGE:    General : alert x 3, awake, no acute distress,   HEENT: PEERL, EOMI, moist mucus membrane  Neck: supple, no JVD, no meningeal signs  Chest: Clear to auscultation bilaterally   CVS: S1 S2 heard, Capillary refill less than 2 seconds  Abd: soft/ Non tender, non distended, BS physiological,   Ext: no clubbing, no cyanosis, no  edema, brisk 2+ DP pulses  Neuro/Psych: pleasant mood and affect, CN 2-12 grossly intact, sensory grossly within normal limit, Strength 5/5 in all extremities  Skin: warm     CHRONIC MEDICAL DIAGNOSES:  Principal Problem:    Cholestasis  Active Problems:    Biliary sludge  Resolved Problems:    * No resolved hospital problems. *        Greater than 31 minutes were spent with the patient on counseling and coordination of care    Signed:   Caren Macadam, PA-C  06/03/2022  2:33 PM

## 2022-06-03 NOTE — Progress Notes (Incomplete)
DISCHARGE NOTE FROM MEDICAL SURGICAL NURSE    Patient determined to be stable for discharge by attending provider. I have reviewed the discharge instructions and follow-up appointments with the Patient. They verbalized understanding and all questions were answered to their satisfaction. No complaints or further questions were expressed.      Medications sent to pharmacy Appropriate educational materials and medication side effect teaching were provided.      PIV were removed prior to discharge.     Patient did not discharge with any line, foley, or drain.    All personal items collected during admission were returned to the patient prior to discharge.    Post-op patient: No      Lincoln Brigham, LPN

## 2022-06-03 NOTE — Discharge Instructions (Signed)
Discharge Instructions       PATIENT ID: Allison Newman  MRN: [id number]   DATE OF BIRTH: 06-05-1940    DATE OF ADMISSION: 06/01/2022   DATE OF DISCHARGE: 06/03/2022    PRIMARY CARE PROVIDER: Unk Pinto Z     ATTENDING PHYSICIAN: Toy Baker, MD   DISCHARGING PROVIDER: Youlanda Roys Ebubechukwu Jedlicka, PA-C    To contact this individual call [phone] and ask the operator to page.   If unavailable ask to be transferred the Adult Hospitalist Department.    DISCHARGE DIAGNOSES   Cholestasis  - HIDA scan pending: diminshed gallbladder EF, no acute cholecystitis  - General surgery saw the patient, no intervention at this time, may follow up outpatient  - LFTS WNL     COPD  - Patient on wixela at home. Per [facility] notes she does wear supplemental O2 PRN, but it is unclear how much. It seems that her SpO2 improved with ambulation at [facility] which may be suggestive of restrictive etiology.   - Patient with no respiratory complaints today  - Continue home scheduled and PRN nebs  - Supplemental O2 prn for SpO2 > 88%        Chest wall pain /costochondritis  - Will discharge with short course of meloxicam  - The patient will follow up with her PCP for renal function monitoring     HTN  -Continue on metoprolol and HCTZ-triamterene     Gerd   - PPI     Dyslipidemia  - Continue Statin      Obesity  -Counseled on weight loss, dieting and exercise      Hyperthyroidism  - Continue methimazole       CONSULTATIONS: IP CONSULT TO GENERAL SURGERY  IP CONSULT TO HOSPITALIST    PROCEDURES/SURGERIES: * No surgery found *    PENDING TEST RESULTS:   At the time of discharge the following test results are still pending: None    FOLLOW UP APPOINTMENTS:   @DCFOLLOWUP @     ADDITIONAL CARE RECOMMENDATIONS:   Follow up with your PCP for management of chronic conditions  Obtain a blood pressure cuff and check your blood pressure twice a day. If the systolic BP (top number) is 120 or less, do not take blood pressure medicines  For lymphedema, obtain  compression stockings for the legs and thighs    DIET: diabetic diet    ACTIVITY: activity as tolerated     WOUND CARE: N/A     EQUIPMENT needed: N/A      DISCHARGE MEDICATIONS:   See Medication Reconciliation Form    It is important that you take the medication exactly as they are prescribed.   Keep your medication in the bottles provided by the pharmacist and keep a list of the medication names, dosages, and times to be taken in your wallet.   Do not take other medications without consulting your doctor.       NOTIFY YOUR PHYSICIAN FOR ANY OF THE FOLLOWING:   Fever over 101 degrees for 24 hours.   Chest pain, shortness of breath, fever, chills, nausea, vomiting, diarrhea, change in mentation, falling, weakness, bleeding. Severe pain or pain not relieved by medications.  Or, any other signs or symptoms that you may have questions about.      DISPOSITION:   x Home With:   OT  PT  HH  RN       SNF/Inpatient Rehab/LTAC    Independent/assisted living    Hospice    Other:  CDMP Checked:   Yes x     PROBLEM LIST Updated:  Yes x       Signed:   Caren Macadam, PA-C  06/03/2022  2:39 PM

## 2022-06-03 NOTE — Plan of Care (Signed)
Problem: Occupational Therapy - Adult  Goal: By Discharge: Performs self-care activities at highest level of function for planned discharge setting.  See evaluation for individualized goals.  Description: FUNCTIONAL STATUS PRIOR TO ADMISSION:  Patient was ambulatory using RW and was independent for ADLs, required assist for IADLs. Sleeps in a recliner at baseline.     ADL Assistance: Independent, Homemaking Assistance: Needs assistance, Ambulation Assistance: Independent, Transfer Assistance: Independent, Active Driver: No     HOME SUPPORT: Patient lived with family who assist with IADLs.    Occupational Therapy Goals:  Initiated 06/03/2022  1.  Patient will perform grooming in standing with Modified Independence within 7 day(s).  2.  Patient will perform lower body dressing using AD PRN with Modified Independence within 7 day(s).  3.  Patient will perform toilet transfers with Modified Independence  within 7 day(s).  4.  Patient will perform all aspects of toileting with Modified Independence within 7 day(s).  5.  Patient will participate in upper extremity therapeutic exercise/activities with Independence for 10 minutes within 7 day(s).    6.  Patient will utilize energy conservation techniques during functional activities with verbal cues within 7 day(s).  Outcome: Progressing   OCCUPATIONAL THERAPY EVALUATION    Patient: Allison Newman [id number] y.o. female)  Date: 06/03/2022  Primary Diagnosis: Biliary sludge 123XX123  Biliary colic 99991111  Cholestasis [K83.1]  Chronic obstructive pulmonary disease, unspecified COPD type ([place]) [J44.9]  Chest pain, unspecified type [R07.9]         Precautions: Fall Risk                  ASSESSMENT :  The patient is limited by decreased functional mobility, independence in ADLs, high-level IADLs, strength, activity tolerance, endurance, increased pain levels. Patient received sitting in recliner, agreeable to working with therapy. Patient wears 5L of oxygen as needed at baseline and  O2 stable throughout session on 4L this date. Patient with impaired LE access from recliner requiring increased assist for lower body ADLs. Patient stood and ambulating short distance to simulate home distance, required several standing rest breaks with forward flexed posture during breaks. Patient declined toileting or grooming needs at this time but setup with meal at end of session, left sitting in recliner.    Based on the impairments listed above patient presents below but close to her reported baseline. Patient would benefit from skilled OT services during admission to improve independence with self care and functional mobility/transfers. Recommend discharge to home with HHOT vs resumption of OP lymphedema pending pt preference.    Functional Outcome Measure:  The patient scored 16/24 on the AM-PAC outcome measure which is indicative of Cutoff score 39.4 (19) correlates to a good likelihood of discharging home versus a facility.         PLAN :  Recommendations and Planned Interventions:   self care training, therapeutic activities, functional mobility training, balance training, therapeutic exercise, endurance activities, patient education, home safety training, and family training/education    Frequency/Duration: OT Plan of Care: 3 times/week    Recommendation for discharge: (in order for the patient to meet his/her long term goals): resumption of OP lymphedema    Other factors to consider for discharge: high risk for falls    IF patient discharges home will need the following DME: patient owns DME required for discharge       SUBJECTIVE:   Patient stated, "I usually go to the lymphedema clinic, my grandson brings the wheelchair right to the door."  OBJECTIVE DATA SUMMARY:     Past Medical History:   Diagnosis Date    Abdominal pain 05/08/2013    Abnormal finding on EKG 11/17/2013    ACP (advance care planning) 05/03/2015    Acute pulmonary embolism ([place]) 06/08/2017    Arthritis     Chronic kidney disease (CKD),  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    Chronic pain of left knee 08/13/2014    Chronic right hip pain 03/17/2013    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    Diabetes ([facility])     Elevated BUN 09/13/2011    Finger lesion 03/02/2016    Gallbladder polyp 04/08/2013    Gastrointestinal disorder     GERD    GERD (gastroesophageal reflux disease)     Hypercholesterolemia     Hypertension     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    Microalbuminuria 08/29/2013    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, adult ([place]) 08/28/2011    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    On aspirin at home 05/03/2015    Other unknown and unspecified cause of morbidity or mortality     hx of bronchitis    Rash, skin 09/25/2012    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[place]) 03/02/2016    Slow transit constipation [id number]    Umbilical hernia [id number]     Past Surgical History:   Procedure Laterality Date    COLONOSCOPY N/A 04/23/2018    COLONOSCOPY AND ESOPHAGOGASTRODUODENOSCOPY (EGD) performed by Marlise Eves., MD at [facility]    screw in foot left    [place]    knee replacement right    OTHER SURGICAL HISTORY      colonoscopy    TUBAL LIGATION            Expanded or extensive additional review of patient history:   Lives With: Family (grandson, brother)  Type of Home: House  Home Layout: Two level, Able to Live on Main level with bedroom/bathroom  Home Access: Level entry        Bathroom Shower/Tub:  (sponge bathes)  Bathroom Toilet: Handicap height        Home Equipment: Walker, rolling, Cane  Has the patient had two or more falls in the past year or any fall with injury in the past year?: No      Hand Dominance: right     EXAMINATION OF PERFORMANCE DEFICITS:    Cognitive/Behavioral Status:  Orientation  Overall Orientation Status: Within Normal Limits  Orientation Level: Oriented X4  Cognition  Overall Cognitive  Status: WNL    Skin: intact where visible    Edema: LE lymphedema primarily in bil thighs    Hearing:   Hearing  Hearing: Within functional limits    Vision/Perceptual:    Vision - Basic Assessment  Patient Visual Report: No visual complaint reported.  Oculo Motor Control: WNL        Perception  Overall Perceptual Status: WFL    Range of Motion:   AROM: Within functional limits  PROM: Within functional limits      Strength:  Strength: Generally decreased, functional      Coordination:        Coordination: Within functional limits      Tone & Sensation:   Tone: Normal             Functional Mobility  and Transfers for ADLs:    Bed Mobility:     Bed Mobility Training  Bed Mobility Training: No (received in the chair)    Transfers:     Transfer Training  Transfer Training: Yes  Interventions: Verbal cues (for technique)  Sit to Stand: Stand-by assistance  Stand to Sit: Stand-by assistance  Toilet Transfer: Stand-by assistance (infer to commode)      Balance:  Standing: Impaired  Balance  Sitting: Intact  Standing: Impaired  Standing - Static: Good;Constant support  Standing - Dynamic: Good;Constant support        ADL Assessment:          Feeding: Setup       Grooming: Stand by assistance (infer standing at sink)       UE Bathing: Setup (infer from functional reach)       LE Bathing: Moderate assistance (infer from LE access)       UE Dressing: Stand by assistance (infer from functional reach)       LE Dressing: Maximum assistance       Toileting: Stand by assistance (infer from functional reach and standing balance)              ADL Intervention and task modifications:    Feeding: .  - Container Management : Minimal Assistance and Seated in [place] to Mouth: Set-up Assistance and Seated in chair  - Drink to Mouth : Independent and Seated in chair                Lower Extremity Dressing: .  - Socks : Maximal Assistance and Seated in chair                    [employer] [facility]	[place] AM-PACTM "6 Clicks"                                                        Daily Activity Inpatient Short Form  How much help from another person does the patient currently need... Total; A Lot A Little None   1.  Putting on and taking off regular lower body clothing? []   1 [x]   2 []   3 []   4   2.  Bathing (including washing, rinsing, drying)? []   1 [x]   2 []   3 []   4   3.  Toileting, which includes using toilet, bedpan or urinal? []  1 []   2 [x]   3 []   4   4.  Putting on and taking off regular upper body clothing? []   1 []   2 [x]   3 []   4   5.  Taking care of personal grooming such as brushing teeth? []   1 []   2 [x]   3 []   4   6.  Eating meals? []   1 []   2 [x]   3 []   4    2007, Trustees of [employer], under license to [employer]. All rights reserved     Score: 16/24     Interpretation of Tool:  Represents clinically-significant functional categories (i.e. Activities of daily living).    Cutoff score 39.4 (19) correlates to a good likelihood of discharging home versus a facility  [place], Jon Gills, Jeanella Flattery, Mena Goes. [place], Roslynn Amble. Annabell Howells, AM-PAC "6-Clicks" Functional Assessment  Scores Predict [facility] Discharge Destination, Physical Therapy, Volume 94, Issue 9, 04 November 2012, Pages [phone], [email or web address]      Pain Rating:  Patient reports baseline generalized pain from various back and chronic conditions     Pain Intervention(s):   pain is at a level acceptable to the patient    Activity Tolerance:   Good, requires rest breaks, and desaturates with activity and requires oxygen    After treatment:   Patient left in no apparent distress sitting up in chair, Call bell within reach, and Bed/ chair alarm activated    COMMUNICATION/EDUCATION:   The patient's plan of care was discussed with: physical therapist and registered nurse    Patient Education  Education Given To: Patient  Education Provided: Role of Therapy;Plan of Education officer, community;Fall Prevention Strategies  Education Method: Verbal  Barriers to Learning: None  Education Outcome: Verbalized understanding;Demonstrated understanding;Continued education needed    Thank you for this referral.  Wendee Beavers, OTR/L  Minutes: 22    Occupational Therapy Evaluation Charge Determination   History Examination Decision-Making   LOW Complexity : Brief history review  LOW Complexity: 1-3 Performance deficits relating to physical, cognitive, or psychosocial skills that result in activity limitations and/or participation restrictions LOW Complexity: No comorbidities that affect functional and  no verbal  or physical assist needed to complete eval tasks   Based on the above components, the patient evaluation is determined to be of the following complexity level: Low

## 2022-06-05 LAB — EKG 12-LEAD
Atrial Rate: 71 {beats}/min
Diagnosis: NORMAL
P Axis: 59 degrees
P-R Interval: 180 ms
Q-T Interval: 424 ms
QRS Duration: 112 ms
QTc Calculation (Bazett): 460 ms
R Axis: 114 degrees
T Axis: -59 degrees
Ventricular Rate: 71 {beats}/min

## 2022-06-06 ENCOUNTER — Ambulatory Visit (INDEPENDENT_AMBULATORY_CARE_PROVIDER_SITE_OTHER): Payer: No Typology Code available for payment source | Admitting: Family Medicine

## 2022-06-06 ENCOUNTER — Encounter: Payer: Self-pay | Admitting: Family Medicine

## 2022-06-06 VITALS — BP 110/70 | HR 68 | Temp 97.7°F | Ht 67.0 in | Wt 228.2 lb

## 2022-06-06 DIAGNOSIS — R600 Localized edema: Secondary | ICD-10-CM | POA: Diagnosis not present

## 2022-06-06 DIAGNOSIS — I872 Venous insufficiency (chronic) (peripheral): Secondary | ICD-10-CM | POA: Diagnosis not present

## 2022-06-06 LAB — EKG 12-LEAD
Atrial Rate: 69 {beats}/min
P Axis: 18 degrees
P-R Interval: 206 ms
Q-T Interval: 416 ms
QRS Duration: 96 ms
QTc Calculation (Bazett): 445 ms
R Axis: 125 degrees
T Axis: -86 degrees
Ventricular Rate: 69 {beats}/min

## 2022-06-06 LAB — COMPREHENSIVE METABOLIC PANEL
ALT: 10 U/L (ref 0–35)
AST: 21 U/L (ref 0–37)
Albumin: 4 g/dL (ref 3.5–5.2)
Alkaline Phosphatase: 80 U/L (ref 39–117)
BUN: 18 mg/dL (ref 6–23)
CO2: 34 mEq/L — ABNORMAL HIGH (ref 19–32)
Calcium: 9.7 mg/dL (ref 8.4–10.5)
Chloride: 102 mEq/L (ref 96–112)
Creatinine, Ser: 1.05 mg/dL (ref 0.40–1.20)
GFR: 50.41 mL/min — ABNORMAL LOW (ref >60.00)
Glucose, Bld: 87 mg/dL (ref 70–99)
Potassium: 3.8 mEq/L (ref 3.5–5.1)
Sodium: 140 mEq/L (ref 135–145)
Total Bilirubin: 0.9 mg/dL (ref 0.2–1.2)
Total Protein: 7.4 g/dL (ref 6.0–8.3)

## 2022-06-06 LAB — BRAIN NATRIURETIC PEPTIDE: Pro B Natriuretic peptide (BNP): 64 pg/mL (ref 0.0–100.0)

## 2022-06-06 LAB — TSH: TSH: 0.6 u[IU]/mL (ref 0.35–5.50)

## 2022-06-06 NOTE — Patient Instructions (Signed)
Elevated feet above heart as much.  Work on trying to be more active with walking.  Wear compression hose as much as able.  Please stop at the lab to have labs drawn.

## 2022-06-06 NOTE — Progress Notes (Signed)
Patient ID: Margaret Gordon, female    DOB: 1940/07/17, 83 y.o.   MRN: [id number]  This visit was conducted in person.  BP 110/70   Pulse 68   Temp 97.7 F (36.5 C) (Temporal)   Ht 5\' 7"  (1.702 m)   Wt 228 lb 4 oz (103.5 kg)   SpO2 100%   BMI 35.75 kg/m    CC:  Chief Complaint  Patient presents with   Leg Swelling   Foot Swelling    Subjective:   HPI: Margaret Gordon is a 82 y.o. female presenting on 06/06/2022 for Leg Swelling and Foot Swelling   She has noted new onset foot and leg swelling,   bilaterally.  Started 05/05/2022  Occ wine, stopped salting foods. .  Elevating leg above heart... swelling resolves then swelling comes back when on feet.   No CP, no SOB.  She has been feeling more tired lately... she has noted since on gabapentin... helping minimally with pain... has follow up appt with pain management next week.   Minimal  improvement with Lasix 20 mg daily. Also on temisartan HCTZ daily.  Wt Readings from Last 3 Encounters:  06/06/22 228 lb 4 oz (103.5 kg)  02/08/22 223 lb 4 oz (101.3 kg)  01/05/22 219 lb 8 oz (99.6 kg)   BP Readings from Last 3 Encounters:  06/06/22 110/70  02/08/22 122/80  01/05/22 122/80         Relevant past medical, surgical, family and social history reviewed and updated as indicated. Interim medical history since our last visit reviewed. Allergies and medications reviewed and updated. Outpatient Medications Prior to Visit  Medication Sig Dispense Refill   amLODipine (NORVASC) 5 MG tablet TAKE 1 TABLET (5 MG TOTAL) BY MOUTH DAILY. 90 tablet 0   atorvastatin (LIPITOR) 10 MG tablet Take 1 tablet (10 mg total) by mouth daily. 90 tablet 3   FIBER ADULT GUMMIES PO Take 1 each by mouth daily.     furosemide (LASIX) 20 MG tablet Take 1 tablet (20 mg total) by mouth daily. 30 tablet 3   gabapentin (NEURONTIN) 100 MG capsule Take 100 mg by mouth 2 (two) times daily.     meclizine (ANTIVERT) 25 MG tablet Take 1 tablet (25 mg  total) by mouth daily as needed for dizziness. 30 tablet 5   Multiple Vitamin (MULTIVITAMIN WITH MINERALS) TABS tablet Take 1 tablet by mouth at bedtime.     pantoprazole (PROTONIX) 40 MG tablet TAKE 1 TABLET BY MOUTH EVERY DAY 90 tablet 0   telmisartan-hydrochlorothiazide (MICARDIS HCT) 40-12.5 MG tablet Take 1 tablet by mouth daily. 90 tablet 1   traMADol (ULTRAM) 50 MG tablet Take 1 tablet (50 mg total) by mouth every 6 (six) hours as needed. 30 tablet 0   No facility-administered medications prior to visit.     Per HPI unless specifically indicated in ROS section below Review of Systems  Constitutional:  Negative for fatigue and fever.  HENT:  Negative for congestion.   Eyes:  Negative for pain.  Respiratory:  Negative for cough and shortness of breath.   Cardiovascular:  Negative for chest pain, palpitations and leg swelling.  Gastrointestinal:  Negative for abdominal pain.  Genitourinary:  Negative for dysuria and vaginal bleeding.  Musculoskeletal:  Positive for arthralgias and back pain.  Neurological:  Negative for syncope, light-headedness and headaches.  Psychiatric/Behavioral:  Negative for dysphoric mood.    Objective:  BP 110/70   Pulse 68   Temp 97.7  F (36.5 C) (Temporal)   Ht 5\' 7"  (1.702 m)   Wt 228 lb 4 oz (103.5 kg)   SpO2 100%   BMI 35.75 kg/m   Wt Readings from Last 3 Encounters:  06/06/22 228 lb 4 oz (103.5 kg)  02/08/22 223 lb 4 oz (101.3 kg)  01/05/22 219 lb 8 oz (99.6 kg)      Physical Exam Constitutional:      General: She is not in acute distress.    Appearance: Normal appearance. She is well-developed. She is not ill-appearing or toxic-appearing.  HENT:     Head: Normocephalic.     Right Ear: Hearing, tympanic membrane, ear canal and external ear normal. Tympanic membrane is not erythematous, retracted or bulging.     Left Ear: Hearing, tympanic membrane, ear canal and external ear normal. Tympanic membrane is not erythematous, retracted or  bulging.     Nose: No mucosal edema or rhinorrhea.     Right Sinus: No maxillary sinus tenderness or frontal sinus tenderness.     Left Sinus: No maxillary sinus tenderness or frontal sinus tenderness.     Mouth/Throat:     Pharynx: Uvula midline.  Eyes:     General: Lids are normal. Lids are everted, no foreign bodies appreciated.     Conjunctiva/sclera: Conjunctivae normal.     Pupils: Pupils are equal, round, and reactive to light.  Neck:     Thyroid: No thyroid mass or thyromegaly.     Vascular: No carotid bruit.     Trachea: Trachea normal.  Cardiovascular:     Rate and Rhythm: Normal rate and regular rhythm.     Pulses: Normal pulses.     Heart sounds: Normal heart sounds, S1 normal and S2 normal. No murmur heard.    No systolic murmur is present.     No diastolic murmur is present.     No friction rub. No gallop.  Pulmonary:     Effort: Pulmonary effort is normal. No tachypnea or respiratory distress.     Breath sounds: Examination of the right-lower field reveals rales. Examination of the left-lower field reveals rales. Rales present. No decreased breath sounds, wheezing or rhonchi.  Abdominal:     General: Bowel sounds are normal.     Palpations: Abdomen is soft.     Tenderness: There is no abdominal tenderness.  Musculoskeletal:     Cervical back: Normal range of motion and neck supple.     Right lower leg: 2+ Pitting Edema present.     Left lower leg: 2+ Pitting Edema present.  Skin:    General: Skin is warm and dry.     Findings: No rash.  Neurological:     Mental Status: She is alert.  Psychiatric:        Mood and Affect: Mood is not anxious or depressed.        Speech: Speech normal.        Behavior: Behavior normal. Behavior is cooperative.        Thought Content: Thought content normal.        Judgment: Judgment normal.       Results for orders placed or performed in visit on 02/08/22  Hemoglobin A1c  Result Value Ref Range   Hgb A1c MFr Bld 6.5 4.6 -  6.5 %  TSH  Result Value Ref Range   TSH 0.82 0.35 - 5.50 uIU/mL  Comprehensive metabolic panel  Result Value Ref Range   Sodium 143 135 - 145 mEq/L  Potassium 3.9 3.5 - 5.1 mEq/L   Chloride 101 96 - 112 mEq/L   CO2 37 (H) 19 - 32 mEq/L   Glucose, Bld 75 70 - 99 mg/dL   BUN 15 6 - 23 mg/dL   Creatinine, Ser 1.13 0.40 - 1.20 mg/dL   Total Bilirubin 0.6 0.2 - 1.2 mg/dL   Alkaline Phosphatase 93 39 - 117 U/L   AST 20 0 - 37 U/L   ALT 9 0 - 35 U/L   Total Protein 7.0 6.0 - 8.3 g/dL   Albumin 3.9 3.5 - 5.2 g/dL   GFR 46.26 (L) >60.00 mL/min   Calcium 9.4 8.4 - 10.5 mg/dL  Lipid panel  Result Value Ref Range   Cholesterol 158 0 - 200 mg/dL   Triglycerides 57.0 0.0 - 149.0 mg/dL   HDL 64.80 >39.00 mg/dL   VLDL 11.4 0.0 - 40.0 mg/dL   LDL Cholesterol 82 0 - 99 mg/dL   Total CHOL/HDL Ratio 2    NonHDL 93.27   CBC with Differential/Platelet  Result Value Ref Range   WBC 8.6 4.0 - 10.5 K/uL   RBC 5.26 (H) 3.87 - 5.11 Mil/uL   Hemoglobin 12.1 12.0 - 15.0 g/dL   HCT 38.5 36.0 - 46.0 %   MCV 73.2 (L) 78.0 - 100.0 fl   MCHC 31.5 30.0 - 36.0 g/dL   RDW 13.8 11.5 - 15.5 %   Platelets 198.0 150.0 - 400.0 K/uL   Neutrophils Relative % 68.6 43.0 - 77.0 %   Lymphocytes Relative 22.2 12.0 - 46.0 %   Monocytes Relative 6.5 3.0 - 12.0 %   Eosinophils Relative 2.4 0.0 - 5.0 %   Basophils Relative 0.3 0.0 - 3.0 %   Neutro Abs 5.9 1.4 - 7.7 K/uL   Lymphs Abs 1.9 0.7 - 4.0 K/uL   Monocytes Absolute 0.6 0.1 - 1.0 K/uL   Eosinophils Absolute 0.2 0.0 - 0.7 K/uL   Basophils Absolute 0.0 0.0 - 0.1 K/uL  VITAMIN D 25 Hydroxy (Vit-D Deficiency, Fractures)  Result Value Ref Range   VITD 50.52 30.00 - 100.00 ng/mL  T3, free  Result Value Ref Range   T3, Free 3.3 2.3 - 4.2 pg/mL  T4, free  Result Value Ref Range   Free T4 0.86 0.60 - 1.60 ng/dL    Assessment and Plan  Peripheral edema Assessment & Plan: Acute, new No clear dietary changes, minimal alcohol and has cut back on salt. She  is wearing compression hose and elevating her feet.  Swelling improves with elevation suggesting likely venous insufficiency component.  She is very inactive given her chronic back pain and mobility issues.  I encouraged her to work on physical activity is much as tolerated to help redistribute fluid. Given Rales on exam and persistent symptoms will evaluate for secondary causes with labs including complete metabolic panel, thyroid and BNP. May need echocardiogram to evaluate heart further.  She is currently on both telmisartan/hydrochlorothiazide as well as Lasix 20 mg daily without much benefit. If diuretic increased we would need to watch blood pressure closely.  Orders: -     Comprehensive metabolic panel -     TSH -     Brain natriuretic peptide  Venous insufficiency    No follow-ups on file.   Eliezer Lofts, MD

## 2022-06-06 NOTE — Assessment & Plan Note (Addendum)
Acute, new No clear dietary changes, minimal alcohol and has cut back on salt. She is wearing compression hose and elevating her feet.  Swelling improves with elevation suggesting likely venous insufficiency component.  She is very inactive given her chronic back pain and mobility issues.  I encouraged her to work on physical activity is much as tolerated to help redistribute fluid. Given Rales on exam and persistent symptoms will evaluate for secondary causes with labs including complete metabolic panel, thyroid and BNP. May need echocardiogram to evaluate heart further.  She is currently on both telmisartan/hydrochlorothiazide as well as Lasix 20 mg daily without much benefit. If diuretic increased we would need to watch blood pressure closely.

## 2022-06-11 ENCOUNTER — Other Ambulatory Visit: Payer: Self-pay

## 2022-06-12 ENCOUNTER — Other Ambulatory Visit: Payer: Self-pay

## 2022-06-12 MED ORDER — FUROSEMIDE 20 MG PO TABS: 20.0000 mg | ORAL_TABLET | Freq: Every day | ORAL | 3 refills | Status: DC

## 2022-06-14 DIAGNOSIS — G894 Chronic pain syndrome: Secondary | ICD-10-CM | POA: Diagnosis not present

## 2022-06-14 DIAGNOSIS — M47816 Spondylosis without myelopathy or radiculopathy, lumbar region: Secondary | ICD-10-CM | POA: Diagnosis not present

## 2022-06-14 DIAGNOSIS — M961 Postlaminectomy syndrome, not elsewhere classified: Secondary | ICD-10-CM | POA: Diagnosis not present

## 2022-06-17 ENCOUNTER — Other Ambulatory Visit: Payer: Self-pay | Admitting: Family Medicine

## 2022-06-28 ENCOUNTER — Encounter: Payer: Self-pay | Admitting: Family Medicine

## 2022-06-28 ENCOUNTER — Ambulatory Visit (INDEPENDENT_AMBULATORY_CARE_PROVIDER_SITE_OTHER): Payer: No Typology Code available for payment source | Admitting: Family Medicine

## 2022-06-28 VITALS — BP 130/72 | HR 85 | Temp 97.3°F | Ht 67.0 in | Wt 222.0 lb

## 2022-06-28 DIAGNOSIS — L03213 Periorbital cellulitis: Secondary | ICD-10-CM | POA: Diagnosis not present

## 2022-06-28 MED ORDER — AMOXICILLIN-POT CLAVULANATE 875-125 MG PO TABS: 1.0000 | ORAL_TABLET | Freq: Two times a day (BID) | ORAL | 0 refills | Status: DC

## 2022-06-28 MED ORDER — PREDNISONE 10 MG PO TABS: ORAL_TABLET | ORAL | 0 refills | Status: DC

## 2022-06-28 NOTE — Assessment & Plan Note (Signed)
Acute, no evidence of orbital cellulitis.  Most likely bacterial periorbital cellulitis versus allergic reaction. Will continue Zyrtec, warm compresses.  No clear erythema in conjunctiva. I feel like allergic response is less likely given known new exposures and unilateral swelling.  Will treat with prednisone taper 10 mg and start Augmentin twice daily for 10 days. Return and ER precautions provided. Close follow-up in 2 days.

## 2022-06-28 NOTE — Patient Instructions (Signed)
Complete course of antibiotics and start ASAP.  Complete prednisone taper.. take in morning.  Call if eye pain, fever or redness spreading.  Can conitnue warm compresses.

## 2022-06-28 NOTE — Progress Notes (Signed)
Patient ID: Margaret Gordon, female    DOB: 16-Feb-1941, 82 y.o.   MRN: [id number]  This visit was conducted in person.  BP 130/72 (BP Location: Right Arm, Patient Position: Sitting, Cuff Size: Normal)   Pulse 85   Temp (!) 97.3 F (36.3 C) (Temporal)   Ht  (1.702 m)   Wt 222 lb (100.7 kg)   SpO2 94%   BMI 34.77 kg/m    CC:  Chief Complaint  Patient presents with   Eye Problem    Right eye swollen shut since Monday. Has not had any injury, bites or stings.     Subjective:   HPI: Margaret Gordon is a 82 y.o. female presenting on 06/28/2022 for Eye Problem (Right eye swollen shut since Monday. Has not had any injury, bites or stings. )   New onset right eye swelling x 3 days.. woke up with swelling.  Swelling is continuing to worsen. Mild redness.  She has been treating with warm compresses... noted some discharge.. white.   No sinus pressure, no nasal congestion, no sneezing.   No eye drops.   No new meds, no new exposures. No known bug bites.  No SOB, no oral swelling  No rash.     Took a zyrtec yesterday.   On  [facility].  Relevant past medical, surgical, family and social history reviewed and updated as indicated. Interim medical history since our last visit reviewed. Allergies and medications reviewed and updated. Outpatient Medications Prior to Visit  Medication Sig Dispense Refill   amLODipine (NORVASC) 5 MG tablet TAKE 1 TABLET (5 MG TOTAL) BY MOUTH DAILY. 90 tablet 0   atorvastatin (LIPITOR) 10 MG tablet Take 1 tablet (10 mg total) by mouth daily. 90 tablet 3   FIBER ADULT GUMMIES PO Take 1 each by mouth daily.     furosemide (LASIX) 20 MG tablet Take 1 tablet (20 mg total) by mouth daily. 30 tablet 3   gabapentin (NEURONTIN) 100 MG capsule Take 100 mg by mouth 2 (two) times daily.     meclizine (ANTIVERT) 25 MG tablet Take 1 tablet (25 mg total) by mouth daily as needed for dizziness. 30 tablet 5   Multiple Vitamin (MULTIVITAMIN WITH MINERALS)  TABS tablet Take 1 tablet by mouth at bedtime.     pantoprazole (PROTONIX) 40 MG tablet TAKE 1 TABLET BY MOUTH EVERY DAY 90 tablet 0   telmisartan-hydrochlorothiazide (MICARDIS HCT) 40-12.5 MG tablet Take 1 tablet by mouth daily. 90 tablet 1   traMADol (ULTRAM) 50 MG tablet Take 1 tablet (50 mg total) by mouth every 6 (six) hours as needed. 30 tablet 0   No facility-administered medications prior to visit.     Per HPI unless specifically indicated in ROS section below Review of Systems Objective:  BP 130/72 (BP Location: Right Arm, Patient Position: Sitting, Cuff Size: Normal)   Pulse 85   Temp (!) 97.3 F (36.3 C) (Temporal)   Ht  (1.702 m)   Wt 222 lb (100.7 kg)   SpO2 94%   BMI 34.77 kg/m   Wt Readings from Last 3 Encounters:  06/28/22 222 lb (100.7 kg)  06/06/22 228 lb 4 oz (103.5 kg)  02/08/22 223 lb 4 oz (101.3 kg)      Physical Exam Constitutional:      General: She is not in acute distress.    Appearance: Normal appearance. She is well-developed. She is not ill-appearing or toxic-appearing.  HENT:  Head: Normocephalic.     Right Ear: Hearing, tympanic membrane, ear canal and external ear normal. Tympanic membrane is not erythematous, retracted or bulging.     Left Ear: Hearing, tympanic membrane, ear canal and external ear normal. Tympanic membrane is not erythematous, retracted or bulging.     Nose: No mucosal edema or rhinorrhea.     Right Sinus: No maxillary sinus tenderness or frontal sinus tenderness.     Left Sinus: No maxillary sinus tenderness or frontal sinus tenderness.     Mouth/Throat:     Pharynx: Uvula midline.  Eyes:     General: Lids are normal. Lids are everted, no foreign bodies appreciated. Vision grossly intact. Gaze aligned appropriately.        Right eye: Discharge present.     Extraocular Movements: Extraocular movements intact.     Conjunctiva/sclera: Conjunctivae normal.     Pupils: Pupils are equal, round, and reactive to light.      Comments: Significant swelling in right eyelids, purulent discharge right eye  No lip and tongue swelling.  Neck:     Thyroid: No thyroid mass or thyromegaly.     Vascular: No carotid bruit.     Trachea: Trachea normal.  Cardiovascular:     Rate and Rhythm: Normal rate and regular rhythm.     Pulses: Normal pulses.     Heart sounds: Normal heart sounds, S1 normal and S2 normal. No murmur heard.    No friction rub. No gallop.  Pulmonary:     Effort: Pulmonary effort is normal. No tachypnea or respiratory distress.     Breath sounds: Normal breath sounds. No decreased breath sounds, wheezing, rhonchi or rales.  Abdominal:     General: Bowel sounds are normal.     Palpations: Abdomen is soft.     Tenderness: There is no abdominal tenderness.  Musculoskeletal:     Cervical back: Normal range of motion and neck supple.  Skin:    General: Skin is warm and dry.     Findings: No rash.  Neurological:     Mental Status: She is alert.  Psychiatric:        Mood and Affect: Mood is not anxious or depressed.        Speech: Speech normal.        Behavior: Behavior normal. Behavior is cooperative.        Thought Content: Thought content normal.        Judgment: Judgment normal.       Results for orders placed or performed in visit on 06/06/22  Comprehensive metabolic panel  Result Value Ref Range   Sodium 140 135 - 145 mEq/L   Potassium 3.8 3.5 - 5.1 mEq/L   Chloride 102 96 - 112 mEq/L   CO2 34 (H) 19 - 32 mEq/L   Glucose, Bld 87 70 - 99 mg/dL   BUN 18 6 - 23 mg/dL   Creatinine, Ser 1.61 0.40 - 1.20 mg/dL   Total Bilirubin 0.9 0.2 - 1.2 mg/dL   Alkaline Phosphatase 80 39 - 117 U/L   AST 21 0 - 37 U/L   ALT 10 0 - 35 U/L   Total Protein 7.4 6.0 - 8.3 g/dL   Albumin 4.0 3.5 - 5.2 g/dL   GFR 09.60 (L) >45.40 mL/min   Calcium 9.7 8.4 - 10.5 mg/dL  TSH  Result Value Ref Range   TSH 0.60 0.35 - 5.50 uIU/mL  Brain natriuretic peptide  Result Value Ref Range  Pro B Natriuretic  peptide (BNP) 64.0 0.0 - 100.0 pg/mL    Assessment and Plan  Periorbital cellulitis of right eye Assessment & Plan: Acute, no evidence of orbital cellulitis.  Most likely bacterial periorbital cellulitis versus allergic reaction. Will continue Zyrtec, warm compresses.  No clear erythema in conjunctiva. I feel like allergic response is less likely given known new exposures and unilateral swelling.  Will treat with prednisone taper 10 mg and start Augmentin twice daily for 10 days. Return and ER precautions provided. Close follow-up in 2 days.     Other orders -     predniSONE; 3 tabs by mouth daily x 3 days, then 2 tabs by mouth daily x 2 days then 1 tab by mouth daily x 2 days  Dispense: 15 tablet; Refill: 0 -     Amoxicillin-Pot Clavulanate; Take 1 tablet by mouth 2 (two) times daily.  Dispense: 20 tablet; Refill: 0    Return in about 2 days (around 06/30/2022).   Kerby Nora, MD

## 2022-06-30 ENCOUNTER — Encounter: Payer: Self-pay | Admitting: Family Medicine

## 2022-06-30 ENCOUNTER — Ambulatory Visit (INDEPENDENT_AMBULATORY_CARE_PROVIDER_SITE_OTHER): Payer: No Typology Code available for payment source | Admitting: Family Medicine

## 2022-06-30 ENCOUNTER — Ambulatory Visit: Payer: No Typology Code available for payment source | Admitting: Family Medicine

## 2022-06-30 VITALS — BP 128/80 | HR 83 | Temp 98.4°F | Ht 67.0 in | Wt 221.0 lb

## 2022-06-30 DIAGNOSIS — L03213 Periorbital cellulitis: Secondary | ICD-10-CM | POA: Diagnosis not present

## 2022-06-30 NOTE — Progress Notes (Signed)
Patient ID: Margaret Gordon, female    DOB: 1940/09/02, 82 y.o.   MRN: [id number]  This visit was conducted in person.  BP 128/80   Pulse 83   Temp 98.4 F (36.9 C) (Temporal)   Ht 5\' 7"  (1.702 m)   Wt 221 lb (100.2 kg)   SpO2 96%   BMI 34.61 kg/m    CC:  Chief Complaint  Patient presents with   Periorbital Cellulitis    Right Eye    Subjective:   HPI: Margaret Gordon is a 82 y.o. female presenting on 06/30/2022 for Periorbital Cellulitis (Right Eye)   Periorbital cellulitis.. Dx on 4/24.. S/P prednisone x 2 days and  on day 3 of Augmentin.  No SE.. no upset stomach, mild crankiness per husband.  Mild itching   No ey pain, no vision change.      Relevant past medical, surgical, family and social history reviewed and updated as indicated. Interim medical history since our last visit reviewed. Allergies and medications reviewed and updated. Outpatient Medications Prior to Visit  Medication Sig Dispense Refill   amLODipine (NORVASC) 5 MG tablet TAKE 1 TABLET (5 MG TOTAL) BY MOUTH DAILY. 90 tablet 0   amoxicillin-clavulanate (AUGMENTIN) 875-125 MG tablet Take 1 tablet by mouth 2 (two) times daily. 20 tablet 0   atorvastatin (LIPITOR) 10 MG tablet Take 1 tablet (10 mg total) by mouth daily. 90 tablet 3   FIBER ADULT GUMMIES PO Take 1 each by mouth daily.     furosemide (LASIX) 20 MG tablet Take 1 tablet (20 mg total) by mouth daily. 30 tablet 3   gabapentin (NEURONTIN) 100 MG capsule Take 100 mg by mouth 2 (two) times daily.     meclizine (ANTIVERT) 25 MG tablet Take 1 tablet (25 mg total) by mouth daily as needed for dizziness. 30 tablet 5   Multiple Vitamin (MULTIVITAMIN WITH MINERALS) TABS tablet Take 1 tablet by mouth at bedtime.     pantoprazole (PROTONIX) 40 MG tablet TAKE 1 TABLET BY MOUTH EVERY DAY 90 tablet 0   predniSONE (DELTASONE) 10 MG tablet 3 tabs by mouth daily x 3 days, then 2 tabs by mouth daily x 2 days then 1 tab by mouth daily x 2 days 15 tablet 0    telmisartan-hydrochlorothiazide (MICARDIS HCT) 40-12.5 MG tablet Take 1 tablet by mouth daily. 90 tablet 1   traMADol (ULTRAM) 50 MG tablet Take 1 tablet (50 mg total) by mouth every 6 (six) hours as needed. 30 tablet 0   No facility-administered medications prior to visit.     Per HPI unless specifically indicated in ROS section below Review of Systems  Constitutional:  Negative for fatigue and fever.  HENT:  Negative for congestion.   Eyes:  Negative for pain.  Respiratory:  Negative for cough and shortness of breath.   Cardiovascular:  Negative for chest pain, palpitations and leg swelling.  Gastrointestinal:  Negative for abdominal pain.  Genitourinary:  Negative for dysuria and vaginal bleeding.  Musculoskeletal:  Negative for back pain.  Neurological:  Negative for syncope, light-headedness and headaches.  Psychiatric/Behavioral:  Negative for dysphoric mood.    Objective:  BP 128/80   Pulse 83   Temp 98.4 F (36.9 C) (Temporal)   Ht 5\' 7"  (1.702 m)   Wt 221 lb (100.2 kg)   SpO2 96%   BMI 34.61 kg/m   Wt Readings from Last 3 Encounters:  06/30/22 221 lb (100.2 kg)  06/28/22 222 lb (100.7  kg)  06/06/22 228 lb 4 oz (103.5 kg)      Physical Exam Constitutional:      General: She is not in acute distress.    Appearance: Normal appearance. She is well-developed. She is not ill-appearing or toxic-appearing.  HENT:     Head: Normocephalic.     Right Ear: Hearing, tympanic membrane, ear canal and external ear normal. Tympanic membrane is not erythematous, retracted or bulging.     Left Ear: Hearing, tympanic membrane, ear canal and external ear normal. Tympanic membrane is not erythematous, retracted or bulging.     Nose: No mucosal edema or rhinorrhea.     Right Sinus: No maxillary sinus tenderness or frontal sinus tenderness.     Left Sinus: No maxillary sinus tenderness or frontal sinus tenderness.     Mouth/Throat:     Pharynx: Uvula midline.  Eyes:     General:  Lids are normal. Lids are everted, no foreign bodies appreciated.     Conjunctiva/sclera: Conjunctivae normal.     Pupils: Pupils are equal, round, and reactive to light.  Neck:     Thyroid: No thyroid mass or thyromegaly.     Vascular: No carotid bruit.     Trachea: Trachea normal.  Cardiovascular:     Rate and Rhythm: Normal rate and regular rhythm.     Pulses: Normal pulses.     Heart sounds: Normal heart sounds, S1 normal and S2 normal. No murmur heard.    No friction rub. No gallop.  Pulmonary:     Effort: Pulmonary effort is normal. No tachypnea or respiratory distress.     Breath sounds: Normal breath sounds. No decreased breath sounds, wheezing, rhonchi or rales.  Abdominal:     General: Bowel sounds are normal.     Palpations: Abdomen is soft.     Tenderness: There is no abdominal tenderness.  Musculoskeletal:     Cervical back: Normal range of motion and neck supple.  Skin:    General: Skin is warm and dry.     Findings: No rash.  Neurological:     Mental Status: She is alert.  Psychiatric:        Mood and Affect: Mood is not anxious or depressed.        Speech: Speech normal.        Behavior: Behavior normal. Behavior is cooperative.        Thought Content: Thought content normal.        Judgment: Judgment normal.       Results for orders placed or performed in visit on 06/06/22  Comprehensive metabolic panel  Result Value Ref Range   Sodium 140 135 - 145 mEq/L   Potassium 3.8 3.5 - 5.1 mEq/L   Chloride 102 96 - 112 mEq/L   CO2 34 (H) 19 - 32 mEq/L   Glucose, Bld 87 70 - 99 mg/dL   BUN 18 6 - 23 mg/dL   Creatinine, Ser 1.61 0.40 - 1.20 mg/dL   Total Bilirubin 0.9 0.2 - 1.2 mg/dL   Alkaline Phosphatase 80 39 - 117 U/L   AST 21 0 - 37 U/L   ALT 10 0 - 35 U/L   Total Protein 7.4 6.0 - 8.3 g/dL   Albumin 4.0 3.5 - 5.2 g/dL   GFR 09.60 (L) >45.40 mL/min   Calcium 9.7 8.4 - 10.5 mg/dL  TSH  Result Value Ref Range   TSH 0.60 0.35 - 5.50 uIU/mL  Brain  natriuretic peptide  Result  Value Ref Range   Pro B Natriuretic peptide (BNP) 64.0 0.0 - 100.0 pg/mL    Assessment and Plan  There are no diagnoses linked to this encounter.  No follow-ups on file.   Kerby Nora, MD

## 2022-06-30 NOTE — Assessment & Plan Note (Signed)
Acute, dramatic improvement with antibiotics and prednisone.  Patient without significant side effects to either medication.  Full range of motion of the eye and no red flags.  No vision change. Return and ER precautions provided.

## 2022-06-30 NOTE — Patient Instructions (Signed)
Can start probiotic to help GI flora.Karn Pickler or Culturelle.

## 2022-07-26 DIAGNOSIS — G894 Chronic pain syndrome: Secondary | ICD-10-CM | POA: Diagnosis not present

## 2022-07-26 DIAGNOSIS — M961 Postlaminectomy syndrome, not elsewhere classified: Secondary | ICD-10-CM | POA: Diagnosis not present

## 2022-07-28 ENCOUNTER — Other Ambulatory Visit: Payer: Self-pay | Admitting: Family Medicine

## 2022-08-08 DIAGNOSIS — M47816 Spondylosis without myelopathy or radiculopathy, lumbar region: Secondary | ICD-10-CM | POA: Diagnosis not present

## 2022-08-09 ENCOUNTER — Encounter: Payer: MEDICARE | Primary: Geriatric Medicine

## 2022-08-09 DIAGNOSIS — I890 Lymphedema, not elsewhere classified: Secondary | ICD-10-CM

## 2022-08-09 NOTE — Other (Addendum)
[facility]  a part of [facility]   [address], [address]  [place], [place] [address]  Phone: [phone]  Fax: [phone]    CONTINUED PLAN OF CARE/RECERTIFICATION FOR PHYSICAL THERAPY or OCCUPATIONAL THERAPY        Patient Name:              Allison Newman DOB:  September 26, 1940   Treatment/Medical Diagnosis:  Lymphedema, not elsewhere classified [I89.0]   Onset Date:  11/04/2020     Referral Source:  Rosalie Doctor, APRN - NP Start of Care [facility]):  11/29/21   Prior Hospitalization:  See Medical History Provider #:  [id number]      Prior Level of Function (PLOF):  Ambulates with rolling walker for short community and household distances; wears oxygen at home PRN; sleeps in a chair with legs elevated. Modified independent for bathing and dressing. Tends to sponge bathe but will bathe in a walk in shower with shower chair on occasion.    Comorbidities:  See evaluation   Medications:  Verified on Patient Summary List   Visits from [facility]:  6 Missed Visits:  5     Progress toward Goals:  Short Term Goals: To be accomplished in 5 treatments.  Patient and/or caregiver will verbalize 3/3 signs and symptoms of infection without external cueing, in order to reduce the risk of infection and skin impairment and to promote optimal self management of condition.  Reinforced daily skin care again today and reviewed signs and symptoms of infection. Goal met 03/14/22  2.   Patient to perform 5/5 lymphedema remedial exercises in session with modified independence utilizing HEP handout, in order to promote optimal independence with management of         condition, as well as promote optimal limb volume reduction required for proper fit of donned clothing in 6 weeks. Continued education in the role and benefit of daily participation in remedial lymphedema exercises. Goal met 03/14/22     Long Term Goals: To be accomplished in 10 treatments.  1.  Patient will demonstrate an improvement in self perceived functional  impairment as evidenced by an improved score on the Lymphedema Life Impact Scale from 43% to 39%. Scores a 25% today. Goal met   2.  Patient will demonstrate a decrease in girth measurement of the trunk by 2 cm waist/hips demonstrating a reduction of swelling in the trunk for increased comfort and ease of donning and doffing clothing. In progress  3.  Patient will demonstrate a loss of volume of the 500 ml in the legs bilaterally to reduce the risk of infection and progression of swelling over time for ease of performing lower body dressing and safe mobility. Goal met 03/14/12  4.   Patient will be measured for and obtain comfortable and optimal fitting compression products to prevent reaccumulation of fluid at discharge which could impair patient's ability to perform         safe mobility and dressing. In progress  5.  Patient will obtain a home vaso-pneumatic device if appropriate and use at least 4 days each week to prevent reaccumulation of fluid for improved tolerance of mobility and ambulation and/or decrease the feeling of limb heaviness with bathing and dressing during the restorative phase of care. Will continue to assess.       Key Functional Changes/Progress: Reduction in LE swelling for ease of mobility and ambulation  Problem List: other increased risk of progression of swelling and infection,  decreased activity tolerance, impaired mobility and gait ability    Treatment Plan may include any combination of the following: [address] Therapeutic Exercise, [address] Manual Therapy, [address]35 Self Care/Home Management, 97016 Vasopneumatic Device  (Vasopnuematic compression justification:  Per bilateral girth measures taken and listed above the edema is considered significant and having an impact on the patient's balance, transfers, and self care), and [address]60 Orthotic Management and Training    Patient Goal(s) has been updated and include  Goals for this certification period include and are to be achieved in   6   treatments:    1.  Patient will demonstrate a decrease in girth measurement of the trunk by 2 cm waist/hips demonstrating a reduction of swelling in the trunk for increased comfort and ease of donning and doffing clothing. In progress  2.   Patient will be measured for and obtain comfortable and optimal fitting compression products to prevent reaccumulation of fluid at discharge which could impair patient's ability to perform         safe mobility and dressing. In progress  3.  Patient will obtain a home vaso-pneumatic device if appropriate and use at least 4 days each week to prevent reaccumulation of fluid for improved tolerance of mobility and ambulation and/or decrease the feeling of limb heaviness with bathing and dressing during the restorative phase of care. Will continue to assess    Frequency / Duration:   Patient to be seen   6 visits over the next 12 weeks.          Assessments/Recommendations for Continuation of Care: Patient was first seen for evaluation Sept 2023. Today is her 6th visit in our clinic and her last visit was March 2024 due to hospitalization and cancelled visits. New MD orders received to continue therapy. Full LE volumes taken today reveal a loss of volume in the legs since last visit as well as loss of approximately 4,000 ml in the R LE and 3,000 ml in the L LE since evaluation Sept 2023 which is most likely due to medication adjustments, weight loss, and improvement in swelling. Patient previously self purchased one set of compression products which she wears daily, alternating legs. She would benefit from obtaining additional compression products for better management of swelling in the trunk and legs, however, it has been difficult to find a vendor that will accept her insurance. Will reach out to additional vendors again today but may need to use a garment fund for payment assistance if possible. Will extend patient's plan of care to allow her to obtain new compression products and  demonstrate independence in garment management and home program prior to discharge to the restorative phase of care.     If you have any questions/comments please contact us directly at [phone].   Thank you for allowing [place] to assist in the care of your patient.    Certification Period: 08/09/22-11/04/22      Dayton Bailiff, PT, CLT       08/09/2022       4:21 PM      ___ I have read the above report and request that my patient continue as recommended.   ___ I have read the above report and request that my patient continue therapy with the following changes/special instructions: ________________________________________________   ___ I have read the above report and request that my patient be discharged from therapy.     Physician's Signature:_________________________   DATE:_________   TIME:________  Rosalie Doctor, APRN - NP    ** Signature, Date and Time must be completed for valid certification **  Please sign and fax to [phone].  Thank you

## 2022-08-09 NOTE — Progress Notes (Addendum)
PHYSICAL THERAPY/OCCUPATIONAL THERAPY - MEDICARE DAILY TREATMENT NOTE (updated 3/23)  Date: 08/09/2022          Patient Name:  Allison Newman DOB:  [birth year]   Medical   Diagnosis:  Lymphedema, not elsewhere classified [I89.0] Treatment Diagnosis:  I89.0     Lymphedema, not elsewhere classified    Referral Source:  Rosalie Doctor, APRN - NP Insurance:   Payor: Francine Graven MEDICARE / Plan: Francine Graven GOLD PLUS HMO / Product Type: *No Product type* /                   Patient DOB verified yes     Visit #   Current  / Total 6 10 (recert requesting 6 additional visits)   Time   In / Out 7:35 am 8:50 am   Total Treatment Time 75   Total Timed Codes 55   1:1 Treatment Time 55      [facility] Totals Reminder:  bill using total billable   min of TIMED therapeutic procedures and modalities.   8-22 min = 1 unit; 23-37 min = 2 units; 38-52 min = 3 units;  53-67 min = 4 units; 68-82 min = 5 units     SUBJECTIVE    Pain Level (0-10 scale): discomfort in the L knee     Any medication changes, allergies to medications, adverse drug reactions, diagnosis change, or new procedure performed?: [x]  No    []  Yes (see summary sheet for update) Patient hospitalized 06/01/22-06/03/22 with cholestasis, costocondritis. Patient was ambulating 40 feet with PT during admission and required 4 l/min of oxygen via nasal cannula.   Medications: Verified on Patient Summary List    Subjective functional status/changes:   Patient reports she continues to wear oxygen only when needed. She does not have a portable oxygen tank since it was not covered by insurance according to patient. O2 sats 87% to 91% on room air in the clinic.     LLIS scores a 25% today    OBJECTIVE    Therapeutic Procedures:  Tx Min Billable or 1:1 Min (if diff from Tx Min) Procedure, Rationale, Specifics   15 15 [id number] Self Care/Home Management (timed):  improve patient knowledge and understanding of home program  to improve patient's ability to progress to PLOF and address remaining functional  goals.  (see flow sheet as applicable)    Details if applicable:   Patient self purchased one Solaris Ready Wrap calf unit and compressive liner socks. She wears the velcro product every day, alternating legs. She does not wear the liner sock because it is too difficult to don and doff. Donated patient one silver liner sock for ease of donning and doffing as another liner sock option.    Pump trial will be deferred at this time due increased oxygen requirements at home, shortness of breath with activity, and O2 sats 87% to 91% today.  Will reassess in a future visit.  Continued education in daily skin care and demonstrated MLD techniques again today.   Continued education in elevating the legs throughout the day to limit the effect of gravity on swelling.   Patient has a LE restorator and is making an effort to use it for 10 minute intervals 2 to 3 times per day.     25 25 [id number] Orthotic Management and Training UE, LE and/or trunk, initial orthotic(s) encounter (timed): assessment and fitting to improve positioning of lower extremity during weight bearing and gait, improve pressure distribution of the  plantar aspect of the foot, improve upper extremity performance, improve postural stability to improve patient's ability to progress to PLOF and address remaining functional goals.    Details if applicable:  Patient self purchased one Solaris Ready Wrap in the size XL and pair of liner socks - the velcro garment fits adequate but is too large and will slip down the leg. Discussed compression options and provided samples of day and night time products for the upper and lower legs as well as samples of compression pants. Patient voiced interest in obtaining additional compression products and would like the garment order to be submitted through insurance. See below.    15 15 [id number] Manual Therapy (timed):  increase tissue extensibility and decrease edema to improve patient's ability to progress to PLOF and address  remaining functional goals.  The manual therapy interventions were performed at a separate and distinct time from the therapeutic activities interventions . (see flow sheet as applicable)    Details if applicable:    Manual Lymphatic Drainage (MLD):  Area to decongest: B LE and trunk   Sequence used and effectiveness: Secondary sequence for lower extremities with trunk involvement. Cervical techniques deferred. MLD focused on upper legs and deep abdominal breathing techniques.        55 55    Total Total       Modalities Rationale:   To  prevent worsening of swelling over time to improve the patient's ability to perform ADL and IADL skills          20 Compression garment measuring and fitting    (Unbilled Time) Upper/Lower Extremity Compression: Measured for the following compression products:    B LE and trunk    Two Solaris Ready Wrap calf units in the size large regular length  Two pairs of silver liner socks  Two Solaris Tribute Wrap thigh garments size extra large regular length    Vendor: will reach out to vendors to check insurance coverage.      Compression garment documentation for garment ordering:     Velcro products:  Patient requires B LE velcro compression products at this time for optimal swelling/lymphedema control and garment fit. Patient will require adjustable compression products and liner socks for ease of donning and doffing and for further reduction in swelling.        Night compression garments:  Patient requires bilateral lower extremity foam based night compression garments to prevent worsening of swelling over night and to provide optimal compression and fit. The outer jacket is required to achieve appropriate compression and swelling containment.              Skin assessment post-treatment (if applicable):    [x]   intact    []   redness- no adverse reaction                 [] redness - adverse reaction:        Patient Education: [x]  Review HEP    [x]   Patient Education billed concurrently  with other procedures   [x]  MLD Patient Education Reviewed with patient, as well as demonstration and instruction during MLD portion of session and Continued education in self MLD technique with bathing and skin care  []  Progressed/Changed HEP based on:   [x]  positioning   []  Kinesiotape   [x]  Skin care   []  wound care   [x]  other: garment options  Patient / caregiver re-demonstrated bandaging. []  Yes  []  No  Compression bandaging/garment precautions reviewed: []  Yes  []   No      Other Objective/Functional Measures    Height:  5'3"  Weight:  262.2 lb, Compared to 260.0 lb when last assessed 05/17/22    Full LE volumes:   R lower extremity 11,065.65 ml today compared to 11,938.85 ml 05/17/22 compared to 15,956.30 ml on evaluation 11/29/21  L lower extremity 13,286.94 ml today compared to 13,504.01 ml 05/17/22  compared to 16,492.50 ml on evaluation 11/29/21  Percentage difference is 20% L LE > R LE compared to 3% on evaluation    Pain Level at end of session (0-10 scale): no reports of pain    Assessment   Patient was first seen for evaluation Sept 2023. Today is her 6th visit in our clinic and her last visit was March 2024 due to hospitalization and cancelled visits. New MD orders received to continue therapy. Full LE volumes taken today reveal a loss of volume in the legs since last visit as well as loss of approximately 4,000 ml in the R LE and 3,000 ml in the L LE since evaluation Sept 2023 which is most likely due to medication adjustments, weight loss, and improvement in swelling. Patient previously self purchased one set of compression products which she wears daily, alternating legs. She would benefit from obtaining additional compression products for better management of swelling in the trunk and legs, however, it has been difficult to find a vendor that will accept her insurance. Will reach out to additional vendors again today but may need to use a garment fund for payment assistance if possible. Will extend  patient's plan of care to allow her to obtain new compression products and demonstrate independence in garment management and home program prior to discharge to the restorative phase of care.     Progress toward goals / Updated goals:  [x]   See Progress Note/Recertification    Short Term Goals: To be accomplished in 5 treatments.  Patient and/or caregiver will verbalize 3/3 signs and symptoms of infection without external cueing, in order to reduce the risk of infection and skin impairment and to promote optimal self management of condition.  Reinforced daily skin care again today and reviewed signs and symptoms of infection. Goal met 03/14/22  2.   Patient to perform 5/5 lymphedema remedial exercises in session with modified independence utilizing HEP handout, in order to promote optimal independence with management of         condition, as well as promote optimal limb volume reduction required for proper fit of donned clothing in 6 weeks. Continued education in the role and benefit of daily participation in remedial lymphedema exercises. Goal met 03/14/22     Long Term Goals: To be accomplished in 10 treatments.  1.  Patient will demonstrate an improvement in self perceived functional impairment as evidenced by an improved score on the Lymphedema Life Impact Scale from 43% to 39%. Scores a 25% today. Goal met   2.  Patient will demonstrate a decrease in girth measurement of the trunk by 2 cm waist/hips demonstrating a reduction of swelling in the trunk for increased comfort and ease of donning and doffing clothing. In progress  3.  Patient will demonstrate a loss of volume of the 500 ml in the legs bilaterally to reduce the risk of infection and progression of swelling over time for ease of performing lower body dressing and safe mobility. Goal met 03/14/12  4.   Patient will be measured for and obtain comfortable and optimal fitting compression products to prevent  reaccumulation of fluid at discharge which could impair  patient's ability to perform         safe mobility and dressing. In progress  5.  Patient will obtain a home vaso-pneumatic device if appropriate and use at least 4 days each week to prevent reaccumulation of fluid for improved tolerance of mobility and ambulation and/or decrease the feeling of limb heaviness with bathing and dressing during the restorative phase of care. Will continue to assess.     PLAN  Yes  Extend plan of care  Re-Cert Due: 06/13/79  []   Upgrade activities as tolerated  []   Discharge due to:  []   Other:    Dayton Bailiff, PT, CLT       08/09/2022       6:58 AM

## 2022-08-09 NOTE — Progress Notes (Addendum)
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   []    C50.211 Malignant neoplasm of breast, Right []    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   []    C50.212 Malignant neoplasm of breast, Left []    L03.115 LE Cellulitis, Right   []     []    L03.116 LE Cellulitis, Left                                   Allison Newman    1940/11/22  Page 2 of 2    Physician Order for DME for Diagnosis of secondary B LE lymphedema, stage 2-3 as Listed on Statement of Medical Necessity, Page 1         Recommended Product:  Units Upper Extremity Rt Lt Units Lower Extremity Rt Lt    Circ-Aid, Ready Wrap, Sigvaris Arm   6 Inelastic binders - Solaris Ready Wrap fusion kit for below knee and Solaris Ready Wrap knee and thigh units size large  [] Foot   [x] Below Knee   [x] Knee   [x] Thigh X X    Circ-Aid Ready Wrap, Sigvaris hand    Jovi Pak, night use [] Full Leg  [] Lower Leg      Tribute Arm, night use    Tribute, night use  [] Full Leg  [] Lower Leg      Jovi Pak Arm, night use    Reid Sleeve Leg/ Foot, night use      Gradiant Compression Sleeves  [] Custom []  RM Arm:  [] CCL1 [] CCL2 [] CCL3  [] Custom []  RM:  [] Glove  [] Gauntlet:                         [] CCL1 [] CCL2 [] CCL3      Gradient Compression Stockings   [] Custom  [] RM Lower Extremity:   [] CCL1       [] CCL2         [] CCL3   [] Knee       [] Thigh        [] Waist Length      Reid sleeve arm w/ hand, night use    Tribute Wrap, night use      Compression Bra           Compression Vest         The above patient was referred for treatment of Lymphedema due to the diagnosis indicated above.  Lymphedema is a progressive and chronic condition.  It may cause acute infections, muscle atrophy, discomfort, and connective tissue fibrosis with irreversible tissue damage, decreased ROM in the affected limb and diminished functional mobility possibly interfering with independence and ability to work.  Recurrent infections and wound complications that commonly occur  with Lymphedema often require hospitalization and extensive wound care, thus increasing medical costs.    The patient has received complete decongestive therapy which includes manual lymphatic drainage, lymphedema specific exercises, compression bandaging, and hygiene/skin care. Goals of therapy are to reduce the edema and prevent re-accumulation of fluid with its complications.  It is medically necessary for this patient to have daytime garments to control this condition. They will need 2 sets (one set to wear and one set to wash for adequate skin care and wearing time).  Garments must be replaced every 4-6 months for effectiveness.  There are no substitutes available that offer acceptable compression treatment for this Lymphedema patient.    If further information is requested, please contact our certified lymphedema therapists at [phone].    []  Jodene Nam, PT, MSPT, CLT-LANA          [x]   Evans Lance, PT, CLT           []  Zannie Cove, PTA, CLT, CSIS    Printed  Provider Name  Provider Signature  Date    Provider NPI

## 2022-08-10 ENCOUNTER — Encounter: Payer: Self-pay | Admitting: Family Medicine

## 2022-08-10 ENCOUNTER — Ambulatory Visit: Payer: No Typology Code available for payment source | Admitting: Family Medicine

## 2022-08-10 VITALS — BP 108/72 | HR 81 | Temp 97.9°F | Resp 16 | Ht 67.0 in | Wt 223.2 lb

## 2022-08-10 DIAGNOSIS — R600 Localized edema: Secondary | ICD-10-CM

## 2022-08-10 DIAGNOSIS — I10 Essential (primary) hypertension: Secondary | ICD-10-CM | POA: Diagnosis not present

## 2022-08-10 DIAGNOSIS — I872 Venous insufficiency (chronic) (peripheral): Secondary | ICD-10-CM

## 2022-08-10 NOTE — Patient Instructions (Signed)
For 1-2 week hold amlodipine 5 mg daily to determine if this medicine could be causing the swelling.. follow BP at home... goal < 150/90.Marland Kitchen  Continue the telmisartan/HCTZ daily.  Continue lasix daily.  If no improvement  in after 1 week of holding the amlodipine, restart it if BP not at goal.  When able get back to activity as you can.  Elevating legs above, compression hose.

## 2022-08-10 NOTE — Assessment & Plan Note (Addendum)
Chronic, blood pressure well-controlled on telmisartan hydrochlorothiazide and amlodipine We discussed peripheral swelling could possibly be worsened by her current use of amlodipine.  She will try a trial off of this and follow blood pressures very closely.

## 2022-08-10 NOTE — Progress Notes (Signed)
Patient ID: Margaret Gordon, female    DOB: 1940-08-27, 82 y.o.   MRN: [id number]  This visit was conducted in person.  BP 108/72   Pulse 81   Temp 97.9 F (36.6 C)   Resp 16   Ht 5\' 7"  (1.702 m)   Wt 223 lb 4 oz (101.3 kg)   SpO2 96%   BMI 34.97 kg/m    CC:  Chief Complaint  Patient presents with   Hypertension    6 month follow up    Leg Swelling    Both legs but worse left side     Subjective:   HPI: Margaret Gordon is a 82 y.o. female presenting on 08/10/2022 for Hypertension (6 month follow up ) and Leg Swelling (Both legs but worse left side )  The patient presents for complete physical and review of chronic health problems. She also has the following acute concerns today: Margaret Gordon is a 82 y.o. female presenting on 08/10/2022 for Hypertension (6 month follow up ) and Leg Swelling (Both legs but worse left side )   Hypertension:  Blood pressure appears well controlled today on amlodipine 5 mg p.o. daily and telmisartan hydrochlorothiazide 40/12.5 mg daily. She uses Lasix 20 mg daily as well.    BP Readings from Last 3 Encounters:  08/10/22 108/72  06/30/22 128/80  06/28/22 130/72  Using medication without problems or lightheadedness: none Chest pain with exertion:none Edema: Yes  Short of breath:none Average home BPs: Other issues: At last office visit in February 2023 she had decreased renal function but this had improved on recheck in March 2 GFR of 55. Labs June 06, 2022 showed GFR 50, creatinine 1.05, normal electrolytes Normal thyroid and BNP.   Continues to have peripheral swelling bilaterally but worse on left leg.  She wears compression hose and elevates her legs    She received epidural steroid injections for back by Dr. Lorrine Kin.. last week.  Has had some improvement.  She is still fairly inacitive.      Wt Readings from Last 3 Encounters:  08/10/22 223 lb 4 oz (101.3 kg)  06/30/22 221 lb (100.2 kg)  06/28/22 222 lb (100.7 kg)         She has been going to Gym 3 days a week with granddaughter.            Relevant past medical, surgical, family and social history reviewed and updated as indicated. Interim medical history since our last visit reviewed. Allergies and medications reviewed and updated. Outpatient Medications Prior to Visit  Medication Sig Dispense Refill   amLODipine (NORVASC) 5 MG tablet TAKE 1 TABLET (5 MG TOTAL) BY MOUTH DAILY. 90 tablet 0   amoxicillin-clavulanate (AUGMENTIN) 875-125 MG tablet Take 1 tablet by mouth 2 (two) times daily. 20 tablet 0   atorvastatin (LIPITOR) 10 MG tablet Take 1 tablet (10 mg total) by mouth daily. 90 tablet 3   furosemide (LASIX) 20 MG tablet TAKE 1 TABLET BY MOUTH EVERY DAY 90 tablet 0   gabapentin (NEURONTIN) 100 MG capsule Take 100 mg by mouth 2 (two) times daily.     meclizine (ANTIVERT) 25 MG tablet Take 1 tablet (25 mg total) by mouth daily as needed for dizziness. 30 tablet 5   Multiple Vitamin (MULTIVITAMIN WITH MINERALS) TABS tablet Take 1 tablet by mouth at bedtime.     pantoprazole (PROTONIX) 40 MG tablet TAKE 1 TABLET BY MOUTH EVERY DAY 90 tablet 0   telmisartan-hydrochlorothiazide (MICARDIS  HCT) 40-12.5 MG tablet Take 1 tablet by mouth daily. 90 tablet 1   traMADol (ULTRAM) 50 MG tablet Take 1 tablet (50 mg total) by mouth every 6 (six) hours as needed. 30 tablet 0   FIBER ADULT GUMMIES PO Take 1 each by mouth daily.     predniSONE (DELTASONE) 10 MG tablet 3 tabs by mouth daily x 3 days, then 2 tabs by mouth daily x 2 days then 1 tab by mouth daily x 2 days (Patient not taking: Reported on 08/10/2022) 15 tablet 0   No facility-administered medications prior to visit.     Per HPI unless specifically indicated in ROS section below Review of Systems  Constitutional:  Negative for fatigue and fever.  HENT:  Negative for congestion.   Eyes:  Negative for pain.  Respiratory:  Negative for cough and shortness of breath.   Cardiovascular:  Positive for leg  swelling. Negative for chest pain and palpitations.  Gastrointestinal:  Negative for abdominal pain.  Genitourinary:  Negative for dysuria and vaginal bleeding.  Musculoskeletal:  Positive for back pain.  Neurological:  Negative for syncope, light-headedness and headaches.  Psychiatric/Behavioral:  Negative for dysphoric mood.     Objective:  BP 108/72   Pulse 81   Temp 97.9 F (36.6 C)   Resp 16   Ht 5\' 7"  (1.702 m)   Wt 223 lb 4 oz (101.3 kg)   SpO2 96%   BMI 34.97 kg/m   Wt Readings from Last 3 Encounters:  08/10/22 223 lb 4 oz (101.3 kg)  06/30/22 221 lb (100.2 kg)  06/28/22 222 lb (100.7 kg)      Physical Exam Constitutional:      General: She is not in acute distress.    Appearance: Normal appearance. She is well-developed. She is not ill-appearing or toxic-appearing.  HENT:     Head: Normocephalic.     Right Ear: Hearing, tympanic membrane, ear canal and external ear normal. Tympanic membrane is not erythematous, retracted or bulging.     Left Ear: Hearing, tympanic membrane, ear canal and external ear normal. Tympanic membrane is not erythematous, retracted or bulging.     Nose: No mucosal edema or rhinorrhea.     Right Sinus: No maxillary sinus tenderness or frontal sinus tenderness.     Left Sinus: No maxillary sinus tenderness or frontal sinus tenderness.     Mouth/Throat:     Pharynx: Uvula midline.  Eyes:     General: Lids are normal. Lids are everted, no foreign bodies appreciated.     Conjunctiva/sclera: Conjunctivae normal.     Pupils: Pupils are equal, round, and reactive to light.  Neck:     Thyroid: No thyroid mass or thyromegaly.     Vascular: No carotid bruit.     Trachea: Trachea normal.  Cardiovascular:     Rate and Rhythm: Normal rate and regular rhythm.     Pulses: Normal pulses.     Heart sounds: Normal heart sounds, S1 normal and S2 normal. No murmur heard.    No friction rub. No gallop.  Pulmonary:     Effort: Pulmonary effort is normal.  No tachypnea or respiratory distress.     Breath sounds: Normal breath sounds. No decreased breath sounds, wheezing, rhonchi or rales.  Abdominal:     General: Bowel sounds are normal.     Palpations: Abdomen is soft.     Tenderness: There is no abdominal tenderness.  Musculoskeletal:     Cervical back: Normal range  of motion and neck supple.     Right lower leg: 2+ Edema present.     Left lower leg: 2+ Edema present.  Skin:    General: Skin is warm and dry.     Findings: No rash.  Neurological:     Mental Status: She is alert.  Psychiatric:        Mood and Affect: Mood is not anxious or depressed.        Speech: Speech normal.        Behavior: Behavior normal. Behavior is cooperative.        Thought Content: Thought content normal.        Judgment: Judgment normal.       Results for orders placed or performed in visit on 06/06/22  Comprehensive metabolic panel  Result Value Ref Range   Sodium 140 135 - 145 mEq/L   Potassium 3.8 3.5 - 5.1 mEq/L   Chloride 102 96 - 112 mEq/L   CO2 34 (H) 19 - 32 mEq/L   Glucose, Bld 87 70 - 99 mg/dL   BUN 18 6 - 23 mg/dL   Creatinine, Ser 8.29 0.40 - 1.20 mg/dL   Total Bilirubin 0.9 0.2 - 1.2 mg/dL   Alkaline Phosphatase 80 39 - 117 U/L   AST 21 0 - 37 U/L   ALT 10 0 - 35 U/L   Total Protein 7.4 6.0 - 8.3 g/dL   Albumin 4.0 3.5 - 5.2 g/dL   GFR 56.21 (L) >30.86 mL/min   Calcium 9.7 8.4 - 10.5 mg/dL  TSH  Result Value Ref Range   TSH 0.60 0.35 - 5.50 uIU/mL  Brain natriuretic peptide  Result Value Ref Range   Pro B Natriuretic peptide (BNP) 64.0 0.0 - 100.0 pg/mL    Assessment and Plan The patient's preventative maintenance and recommended screening tests for an annual wellness exam were reviewed in full today. Brought up to date unless services declined.  Counselled on the importance of diet, exercise, and its role in overall health and mortality. The patient's FH and SH was reviewed, including their home life, tobacco status,  and drug and alcohol status.      Primary hypertension Assessment & Plan: Chronic, blood pressure well-controlled on telmisartan hydrochlorothiazide and amlodipine We discussed peripheral swelling could possibly be worsened by her current use of amlodipine.  She will try a trial off of this and follow blood pressures very closely.   Peripheral edema Assessment & Plan: Chronic,  blood pressure well-controlled on telmisartan hydrochlorothiazide and amlodipine but she continues to have peripheral swelling despite elevation, compression hose.  She remains less active given continued back issues. We discussed peripheral swelling could possibly be worsened by her current use of amlodipine.  She will try a trial off of this.  She will continue using Lasix 21 mg daily.  Of note BNP was normal in the past but if peripheral swelling persistent we can consider echocardiogram.  She denies shortness of breath or chest pain.   Venous insufficiency     Return in about 6 months (around 02/09/2023) for phone AMW,  fasting labs then CPE with me.   Kerby Nora, MD

## 2022-08-10 NOTE — Assessment & Plan Note (Signed)
Chronic,  blood pressure well-controlled on telmisartan hydrochlorothiazide and amlodipine but she continues to have peripheral swelling despite elevation, compression hose.  She remains less active given continued back issues. We discussed peripheral swelling could possibly be worsened by her current use of amlodipine.  She will try a trial off of this.  She will continue using Lasix 21 mg daily.  Of note BNP was normal in the past but if peripheral swelling persistent we can consider echocardiogram.  She denies shortness of breath or chest pain.

## 2022-08-28 ENCOUNTER — Other Ambulatory Visit: Payer: Self-pay | Admitting: Family Medicine

## 2022-08-28 DIAGNOSIS — M47816 Spondylosis without myelopathy or radiculopathy, lumbar region: Secondary | ICD-10-CM | POA: Diagnosis not present

## 2022-08-28 DIAGNOSIS — Z6831 Body mass index (BMI) 31.0-31.9, adult: Secondary | ICD-10-CM | POA: Diagnosis not present

## 2022-08-28 NOTE — Telephone Encounter (Signed)
Left message for Ms. Vasko to return my call.  Need to know if she requested refill on Amlodipine.  At last office visit,  Dr. Ermalene Searing told her to hold this medication.

## 2022-08-29 NOTE — Telephone Encounter (Signed)
Spoke with Ms. Chesterfield.  She is not taking the Amlodipine.  She stopped it as instructed by Dr. Ermalene Searing and the swelling has gone down.  Her blood pressure has been doing okay off the medication.  Refill denied.

## 2022-09-04 ENCOUNTER — Encounter: Payer: MEDICARE | Primary: Geriatric Medicine

## 2022-09-04 ENCOUNTER — Other Ambulatory Visit: Payer: Self-pay | Admitting: Family Medicine

## 2022-09-12 ENCOUNTER — Encounter: Payer: Self-pay | Admitting: Family Medicine

## 2022-09-12 NOTE — Telephone Encounter (Signed)
Handicap Placard Application placed in Dr. Bedsole's office in box to complete. 

## 2022-10-03 ENCOUNTER — Other Ambulatory Visit: Payer: Self-pay | Admitting: Family Medicine

## 2022-10-04 ENCOUNTER — Encounter: Payer: MEDICARE | Primary: Geriatric Medicine

## 2022-10-10 ENCOUNTER — Inpatient Hospital Stay: Admit: 2022-10-10 | Payer: MEDICARE | Primary: Geriatric Medicine

## 2022-10-10 NOTE — Discharge Instructions (Signed)
[facility]  a part of [facility]   [address] [place], [address]  [place], [place] [address]  Phone: [phone]  Fax: [phone]    DISCHARGE SUMMARY  Patient Name: Allison Newman DOB: [birth year]   Treatment/Medical Diagnosis: Lymphedema, not elsewhere classified [I89.0]   Referral Source: Rosalie Doctor, APRN - NP     Date of Initial Visit: 11/29/21 Attended Visits: 7 Missed Visits: 7     SUMMARY OF TREATMENT  Patient was seen for evaluation in our clinic Sept 2023 and treatment consisted of 7 visits with interruption due to a hospitalization, multiple cancelled visits, and delay in obtaining compression products.  Patient was unable to obtain compression products through insurance due to a large deductible and financial limitations. She was willing to self purchase day garments for the L LE and a garment fund was utilized for payment assistance for all other garments. Patient has done fairly well with her home program and use of available compression products for the past several weeks. Full LE volumes taken today reveal an increase in B LE volumes since June but an overall loss of greater than 4,000 ml in the R LE and 2,700 ml in the L LE which is most likely due to modifications to medication as well as use of compression products.  Treatment included: education in a home program, manual lymphatic drainage, assessment of compression needs and orthotic management, exercise, and skin care. All appropriate goals have been met and patient is ready for discharge to the restorative phase of care. She will return to the clinic in 6 months or sooner if needed for assessment of swelling and compression needs.     CURRENT STATUS  Short Term Goals: To be accomplished in 5 treatments.  Patient and/or caregiver will verbalize 3/3 signs and symptoms of infection without external cueing, in order to reduce the risk of infection and skin impairment and to promote optimal self management of condition.   Goal Met  2.   Patient to perform 5/5 lymphedema remedial exercises in session with modified independence utilizing HEP handout, in order to promote optimal independence with management of         condition, as well as promote optimal limb volume reduction required for proper fit of donned clothing in 6 weeks.  Goal Met      Long Term Goals: To be accomplished in 10 treatments.  1.  Patient will demonstrate an improvement in self perceived functional impairment as evidenced by an improved score on the Lymphedema Life Impact Scale from 43% to 39%. Goal Met   2.  Patient will demonstrate a decrease in girth measurement of the trunk by 2 cm waist/hips demonstrating a reduction of swelling in the trunk for increased comfort and ease of donning and doffing clothing. Discontinue goal - no compression products for the trunk have been obtained at this time due to financial limitations. Will reassess in a future visit.   3.  Patient will demonstrate a loss of volume of the 500 ml in the legs bilaterally to reduce the risk of infection and progression of swelling over time for ease of performing lower body dressing and safe mobility. Goal Met   4.   Patient will be measured for and obtain comfortable and optimal fitting compression products to prevent reaccumulation of fluid at discharge which could impair patient's ability to perform         safe mobility and dressing. Goal Met  5.  Patient  will obtain a home vaso-pneumatic device if appropriate and use at least 4 days each week to prevent reaccumulation of fluid for improved tolerance of mobility and ambulation and/or decrease the feeling of limb heaviness with bathing and dressing during the restorative phase of care. Discontinue - will not pursue a pump at this time due to financial limitations and multiple co-morbidities.  Will reassess in a future visit.     RECOMMENDATIONS  Discontinue therapy. Progressing towards or have reached established goals.    Dayton Bailiff, PT, CLT      10/10/2022       4:09 PM    If you have any questions/comments please contact us directly at [phone].   Thank you for allowing [place] to assist in the care of your patient.

## 2022-10-10 NOTE — Progress Notes (Signed)
PHYSICAL THERAPY/OCCUPATIONAL THERAPY - MEDICARE DAILY TREATMENT NOTE (updated 3/23)    Date: 10/10/2022        Patient Name:  Allison Newman DOB:  1940-04-28   Medical   Diagnosis:  Lymphedema, not elsewhere classified [I89.0] Treatment Diagnosis:  I89.0     Lymphedema, not elsewhere classified    Referral Source:  Rosalie Doctor, APRN - NP Insurance:   Payor: Francine Graven MEDICARE / Plan: Francine Graven GOLD PLUS HMO / Product Type: *No Product type* /                   Patient DOB verified yes     Visit #   Current  / Total 7 10   Time   In / Out 8:00 am 9:00 am   Total Treatment Time 60   Total Timed Codes 55   1:1 Treatment Time 55      [facility] Totals Reminder:  bill using total billable   min of TIMED therapeutic procedures and modalities.   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 Not rated on numeric scale.     Any medication changes, allergies to medications, adverse drug reactions, diagnosis change, or new procedure performed?: [x]  No    []  Yes (see summary sheet for update) Patient reports the following modifications to medications. She is now taking Vitamin B and her dosage of Furosemide has been changed from daily to every other day.   Medications: Verified on Patient Summary List    Subjective functional status/changes:   Patient reports that swelling in the legs may be a little worse now that she is taking less Furosemide. She received her new compression products and they seem to fit well, however, she has not initiated use of the new foam garment.     OBJECTIVE    Therapeutic Procedures:  Tx Min Billable or 1:1 Min (if diff from Tx Min) Procedure, Rationale, Specifics   55  [id number] Self Care/Home Management (timed):  improve patient knowledge and understanding of home program  to improve patient's ability to progress to PLOF and address remaining functional goals.  (see flow  sheet as applicable)     Details if applicable:     Skin/wound care/debridement:   Reviewed skin care principles:   Low pH lotion - patient is applying lotion two times each day.   Application following MLD principles  Signs and symptoms of cellulitis  Prevention of cellulitis  How to care for skin injuries       Compression garment management            Patient has the following compression products available:    Two Solaris Ready Wrap calf units: one in size XL and the other in size large to wear daily as tolerated.   Circaid compressive undersocks, one donated silver liner sock, Solaris Ready Wrap liner socks  One Solaris Tribute thigh garment in size XL regular length - obtained through the garment fund.  Discussed wearing the foam garment in conjunction with the lower leg velcro products for a few hours each day, alternating legs, as tolerated.     Education: Educated patient in compression garment donning and doffing, daily wear schedule, laundering instructions garment lifespan, return and reordering process (by bringing prior garments into the clinic),  educated patient to monitor for redness or pressure points on the skin.     Patient demonstrated donning and doffing all compression products with cues except for the circaid compressive liner socks. She does have silver liner socks if she continues to have difficulty with donning and doffing the Circaid compressive socks after they are washed. She does family assistance if needed.     Provided patient with vendor and sizing information if she wishes to obtain additional liner socks.    Footwear/Clothing with compression bandaging or compression garments     [x]   Footwear recommendations for use with bandaging/ compression garments   Home program  Patient to continue with:  Elevation of the legs throughout the day  2.   Remove the night time foam product          with mobility  3.   A home vaso-pneumatic device will be deferred at this time due to patient's  out of pocket responsibility and multiple co-morbidities.    4.  Perform remedial lymphedema exercises       55     Total Total     Modalities Rationale:   To  prevent worsening of swelling over time to improve the patient's ability to perform ADL and IADL skills          5 Compression garment measuring and fitting    (Unbilled Time) Upper/Lower Extremity Compression: Fitted for the following compression products:    B LE and trunk     Two Solaris Ready Wrap calf units in the size large regular length - Patient only able to obtain one Solaris Ready Wrap calf unit in the size large through the garment fund. She self purchased one Solaris Ready Wrap calf unit in the size XL during a previous visit.    One pair of Circaid compressive liner socks in the size large 15-20 mmHg  Two Solaris Tribute Wrap thigh garments size extra large regular length - able to obtain only one Teaching laboratory technician through the garment fund.         Skin assessment post-treatment (if applicable):    [x]   intact    []   redness- no adverse reaction                 [] redness - adverse reaction:        Patient Education: [x]  Review Home program  [x]   Patient Education billed concurrently with other procedures   []  MLD Patient Education Continued education in self MLD technique with bathing and skin care  []  Progressed/Changed HEP based on:   [x]  positioning   []  Kinesiotape   [x]  Skin care   []  wound care   [x]  other: garment management  Patient / caregiver re-demonstrated bandaging. 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 Not rated on numeric scale. Repositioned for better comfort.     Assessment  Patient was seen for evaluation in our clinic Sept 2023 and treatment consisted of 7 visits with interruption due to a hospitalization and delay in obtaining compression products.  Patient was unable to obtain compression products through insurance due to a large deductible and financial limitations. She was willing to self purchase day garments for the L LE and a garment fund was utilized for payment assistance for all other garments. Patient has done fairly well with her home program and use of available compression products for the past several weeks. Full LE volumes taken today reveal an increase in B LE volumes since June but an overall loss of greater than 4,000 ml in the R LE and 2,700 ml in the L LE which is most likely due to modifications to medication as well as use of compression products.  All appropriate goals have been met and patient is ready for discharge to the restorative phase of care. She will return to the clinic in 6 months or sooner if needed for assessment of swelling and compression needs.     Progress toward goals / Updated goals:  [x]   See Discharge Summary    Short Term Goals: To be accomplished in 5 treatments.  Patient and/or caregiver will verbalize 3/3 signs and symptoms of infection without external cueing, in order to reduce the risk of infection and skin impairment and to promote optimal self management of condition.  Goal Met  2.   Patient to perform 5/5 lymphedema remedial exercises in session with modified independence utilizing HEP handout, in order to promote optimal independence with management of         condition, as well as promote optimal limb volume reduction required for proper fit of donned clothing in 6 weeks.  Goal Met      Long Term Goals: To be accomplished in 10  treatments.  1.  Patient will demonstrate an improvement in self perceived functional impairment as evidenced by an improved score on the Lymphedema Life Impact Scale from 43% to 39%. Goal Met   2.  Patient will demonstrate a decrease in girth measurement of the trunk by 2 cm waist/hips demonstrating a reduction of swelling in the trunk for increased comfort and ease of donning and doffing clothing. Discontinue goal - no compression products for the trunk have been obtained at this time due to financial limitations. Will reassess in a future visit.   3.  Patient will demonstrate a loss of volume of the 500 ml in the legs bilaterally to reduce the risk of infection and progression of swelling over time for ease of performing lower body dressing and safe mobility. Goal Met   4.   Patient will be measured for and obtain comfortable and optimal fitting compression products to prevent reaccumulation of fluid at discharge which could impair patient's ability to perform         safe mobility and dressing. Goal Met  5.  Patient will obtain a home vaso-pneumatic device if appropriate and use at least 4 days each week to prevent reaccumulation of fluid for improved tolerance of mobility and ambulation and/or decrease the feeling of limb heaviness with bathing and dressing during the restorative phase of care. Discontinue - will not pursue a pump at this time due to financial limitations and multiple co-morbidities.  Will reassess in a future visit.     PLAN  Re-Cert Due: 2/84/1324  []   Upgrade activities as tolerated  [x]   Discharge due to: all goals met or in  progress  []   Other:    Dayton Bailiff, PT, CLT      10/10/2022       7:17 AM

## 2022-10-19 ENCOUNTER — Encounter: Payer: Self-pay | Admitting: Family Medicine

## 2022-10-20 ENCOUNTER — Encounter: Payer: Self-pay | Admitting: Family Medicine

## 2022-10-20 ENCOUNTER — Ambulatory Visit (INDEPENDENT_AMBULATORY_CARE_PROVIDER_SITE_OTHER): Payer: No Typology Code available for payment source

## 2022-10-20 VITALS — Ht 67.0 in | Wt 223.0 lb

## 2022-10-20 DIAGNOSIS — Z0000 Encounter for general adult medical examination without abnormal findings: Secondary | ICD-10-CM | POA: Diagnosis not present

## 2022-10-20 NOTE — Progress Notes (Signed)
Subjective:   Margaret Gordon is a 82 y.o. female who presents for Medicare Annual (Subsequent) preventive examination.  Visit Complete: Virtual  I connected with  Margaret Gordon on 10/20/22 by a audio enabled telemedicine application and verified that I am speaking with the correct person using two identifiers.  Patient Location: Home  Provider Location: Home Office  I discussed the limitations of evaluation and management by telemedicine. The patient expressed understanding and agreed to proceed.   Review of Systems    Vital Signs: Unable to obtain new vitals due to this being a telehealth visit.  Cardiac Risk Factors include: advanced age (>81men, >70 women);hypertension     Objective:    Today's Vitals   10/20/22 1003  Weight: 223 lb (101.2 kg)  Height: 5\' 7"  (1.702 m)   Body mass index is 34.93 kg/m.     10/20/2022   10:11 AM 10/10/2021   11:37 AM 05/04/2020    9:36 AM 11/03/2016    7:30 AM 10/30/2016    9:18 AM  Advanced Directives  Does Patient Have a Medical Advance Directive? Yes No No No No  Type of Estate agent of [place];Living will      Does patient want to make changes to medical advance directive? No - Patient declined      Copy of Healthcare Power of Attorney in Chart? Yes - validated most recent copy scanned in chart (See row information)      Would patient like information on creating a medical advance directive?  No - Patient declined  No - Patient declined Yes (MAU/Ambulatory/Procedural Areas - Information given)    Current Medications (verified) Outpatient Encounter Medications as of 10/20/2022  Medication Sig   amLODipine (NORVASC) 5 MG tablet TAKE 1 TABLET (5 MG TOTAL) BY MOUTH DAILY.   amoxicillin-clavulanate (AUGMENTIN) 875-125 MG tablet Take 1 tablet by mouth 2 (two) times daily.   atorvastatin (LIPITOR) 10 MG tablet Take 1 tablet (10 mg total) by mouth daily.   furosemide (LASIX) 20 MG tablet TAKE 1 TABLET BY MOUTH  EVERY DAY   gabapentin (NEURONTIN) 100 MG capsule Take 100 mg by mouth 2 (two) times daily.   meclizine (ANTIVERT) 25 MG tablet Take 1 tablet (25 mg total) by mouth daily as needed for dizziness.   Multiple Vitamin (MULTIVITAMIN WITH MINERALS) TABS tablet Take 1 tablet by mouth at bedtime.   pantoprazole (PROTONIX) 40 MG tablet TAKE 1 TABLET BY MOUTH EVERY DAY   predniSONE (DELTASONE) 10 MG tablet 3 tabs by mouth daily x 3 days, then 2 tabs by mouth daily x 2 days then 1 tab by mouth daily x 2 days (Patient not taking: Reported on 08/10/2022)   telmisartan-hydrochlorothiazide (MICARDIS HCT) 40-12.5 MG tablet Take 1 tablet by mouth daily.   traMADol (ULTRAM) 50 MG tablet Take 1 tablet (50 mg total) by mouth every 6 (six) hours as needed.   [DISCONTINUED] FIBER ADULT GUMMIES PO Take 1 each by mouth daily.   No facility-administered encounter medications on file as of 10/20/2022.    Allergies (verified) Patient has no known allergies.   History: Past Medical History:  Diagnosis Date   Anemia    hx   Arthritis    Asthma    GERD (gastroesophageal reflux disease)    History of chicken pox    History of glaucoma    History of hiatal hernia    Hyperlipidemia    Hypertension    Migraines    Pneumonia  hx   Scoliosis    Past Surgical History:  Procedure Laterality Date   LUMBAR LAMINECTOMY/DECOMPRESSION MICRODISCECTOMY Left 11/02/2016   Procedure: Left Lumbar Three-Four, Lumbar Four-Five Laminectomy and Foraminotomy;  Surgeon: Tia Alert, MD;  Location: [facility] OR;  Service: Neurosurgery;  Laterality: Left;   TONSILLECTOMY     VESICOVAGINAL FISTULA CLOSURE W/ TAH     Family History  Problem Relation Age of Onset   Hyperlipidemia Mother    Hypertension Mother    Diabetes Mother    Cancer Mother 65   Breast cancer Mother    Hypertension Sister    Hyperlipidemia Sister    Heart disease Sister    Diabetes Sister    Kidney disease Sister    Arthritis Sister    Kidney disease  Sister    Hypertension Sister    Hyperlipidemia Sister    Diabetes Sister    Diabetes Brother    Early death Brother    Arthritis Daughter    Social History   Socioeconomic History   Marital status: Married    Spouse name: Kerri Perches   Number of children: Not on file   Years of education: Not on file   Highest education level: Associate degree: occupational, Scientist, product/process development, or vocational program  Occupational History   Occupation: Retired    Comment: worked for [employer]    Occupation: Building control surveyor, retired  Tobacco Use   Smoking status: Never    Passive exposure: Past   Smokeless tobacco: Former  Substance and Sexual Activity   Alcohol use: Yes    Alcohol/week: 1.0 standard drink of alcohol    Types: 1 Glasses of wine per week   Drug use: No   Sexual activity: Not on file  Other Topics Concern   Not on file  Social History Narrative   Not on file   Social Determinants of Health   Financial Resource Strain: Low Risk  (10/20/2022)   Overall Financial Resource Strain (CARDIA)    Difficulty of Paying Living Expenses: Not hard at all  Food Insecurity: No Food Insecurity (10/20/2022)   Hunger Vital Sign    Worried About Running Out of Food in the Last Year: Never true    Ran Out of Food in the Last Year: Never true  Transportation Needs: No Transportation Needs (10/20/2022)   PRAPARE - Administrator, Civil Service (Medical): No    Lack of Transportation (Non-Medical): No  Physical Activity: Inactive (10/20/2022)   Exercise Vital Sign    Days of Exercise per Week: 0 days    Minutes of Exercise per Session: 0 min  Stress: No Stress Concern Present (10/20/2022)   [employer] of Occupational Health - Occupational Stress Questionnaire    Feeling of Stress : Not at all  Social Connections: Socially Integrated (10/20/2022)   Social Connection and Isolation Panel [NHANES]    Frequency of Communication with Friends and Family: More than three  times a week    Frequency of Social Gatherings with Friends and Family: More than three times a week    Attends Religious Services: More than 4 times per year    Active Member of [employer] or Organizations: Yes    Attends Engineer, structural: More than 4 times per year    Marital Status: Married    Tobacco Counseling Counseling given: Not Answered   Clinical Intake:  Pre-visit preparation completed: Yes  Pain : No/denies pain     BMI - recorded: 34.93 Nutritional Status:  BMI > 30  Obese Nutritional Risks: None Diabetes: No  How often do you need to have someone help you when you read instructions, pamphlets, or other written materials from your doctor or pharmacy?: 1 - Never  Interpreter Needed?: No  Information entered by :: Theresa Mulligan LPN   Activities of Daily Living    10/20/2022   10:10 AM  In your present state of health, do you have any difficulty performing the following activities:  Hearing? 0  Vision? 0  Difficulty concentrating or making decisions? 0  Walking or climbing stairs? 0  Dressing or bathing? 0  Doing errands, shopping? 0  Preparing Food and eating ? N  Using the Toilet? N  In the past six months, have you accidently leaked urine? N  Do you have problems with loss of bowel control? N  Managing your Medications? N  Managing your Finances? N  Housekeeping or managing your Housekeeping? N    Patient Care Team: Excell Seltzer, MD as PCP - General (Family Medicine)  Indicate any recent Medical Services you may have received from other than [facility] providers in the past year (date may be approximate).     Assessment:   This is a routine wellness examination for Margaret Gordon.  Hearing/Vision screen Hearing Screening - Comments:: Denies hearing difficulties   Vision Screening - Comments:: Wears rx glasses - up to date with routine eye exams with  [facility]  Dietary issues and exercise activities discussed:     Goals Addressed                This Visit's Progress     Patient Stated (pt-stated)        Be able to get strong to remove cane.       Depression Screen    10/20/2022   10:09 AM 08/10/2022    2:24 PM 06/28/2022    8:27 AM 06/06/2022    9:45 AM 10/10/2021   11:41 AM  PHQ 2/9 Scores  PHQ - 2 Score 0 0 0 0 0  PHQ- 9 Score  0 0      Fall Risk    10/20/2022   10:10 AM 08/10/2022    2:23 PM 06/28/2022    8:27 AM 06/06/2022    9:45 AM 10/10/2021   11:38 AM  Fall Risk   Falls in the past year? 1 1 1  0 0  Number falls in past yr: 0 0 0 0 0  Injury with Fall? 0 0 0 0 0  Risk for fall due to : No Fall Risks No Fall Risks Impaired balance/gait Impaired mobility   Follow up Falls prevention discussed Falls evaluation completed Falls evaluation completed;Falls prevention discussed Falls evaluation completed Falls evaluation completed;Education provided;Falls prevention discussed    MEDICARE RISK AT HOME:  Medicare Risk at Home - 10/20/22 1014     Any stairs in or around the home? No    If so, are there any without handrails? No    Home free of loose throw rugs in walkways, pet beds, electrical cords, etc? Yes    Adequate lighting in your home to reduce risk of falls? Yes    Life alert? No    Use of a cane, walker or w/c? Yes    Grab bars in the bathroom? No    Shower chair or bench in shower? Yes    Elevated toilet seat or a handicapped toilet? No  TIMED UP AND GO:  Was the test performed?  No    Cognitive Function:        10/20/2022   10:11 AM 10/10/2021   11:37 AM  6CIT Screen  What Year? 0 points 0 points  What month? 0 points 0 points  What time? 0 points 0 points  Count back from 20 0 points 0 points  Months in reverse 0 points 0 points  Repeat phrase 0 points 0 points  Total Score 0 points 0 points    Immunizations Immunization History  Administered Date(s) Administered   Influenza Split 12/26/2012, 12/19/2014, 12/25/2015, 11/27/2016, 11/20/2017, 12/25/2020   Influenza,  High Dose Seasonal PF 11/19/2017, 11/29/2018, 11/23/2021   PFIZER Comirnaty(Gray Top)Covid-19 Tri-Sucrose Vaccine 11/23/2021   PFIZER(Purple Top)SARS-COV-2 Vaccination 03/28/2019, 04/08/2019, 12/04/2019, 07/17/2020   Pneumococcal Conjugate-13 09/19/2013   Pneumococcal Polysaccharide-23 10/29/2009   Td 10/29/2009   Tdap 10/08/2020    TDAP status: Up to date  Flu Vaccine status: Due, Education has been provided regarding the importance of this vaccine. Advised may receive this vaccine at local pharmacy or Health Dept. Aware to provide a copy of the vaccination record if obtained from local pharmacy or Health Dept. Verbalized acceptance and understanding.  Pneumococcal vaccine status: Up to date  Covid-19 vaccine status: Declined, Education has been provided regarding the importance of this vaccine but patient still declined. Advised may receive this vaccine at local pharmacy or Health Dept.or vaccine clinic. Aware to provide a copy of the vaccination record if obtained from local pharmacy or Health Dept. Verbalized acceptance and understanding.  Qualifies for Shingles Vaccine? Yes   Zostavax completed No   Shingrix Completed?: No.    Education has been provided regarding the importance of this vaccine. Patient has been advised to call insurance company to determine out of pocket expense if they have not yet received this vaccine. Advised may also receive vaccine at local pharmacy or Health Dept. Verbalized acceptance and understanding.  Screening Tests Health Maintenance  Topic Date Due   Zoster Vaccines- Shingrix (1 of 2) Never done   COVID-19 Vaccine (6 - 2023-24 season) 01/18/2022   INFLUENZA VACCINE  10/05/2022   MAMMOGRAM  04/04/2023   Medicare Annual Wellness (AWV)  10/20/2023   DTaP/Tdap/Td (3 - Td or Tdap) 10/09/2030   Pneumonia Vaccine 25+ Years old  Completed   DEXA SCAN  Completed   HPV VACCINES  Aged Out   Colonoscopy  Discontinued   Hepatitis C Screening  Discontinued     Health Maintenance  Health Maintenance Due  Topic Date Due   Zoster Vaccines- Shingrix (1 of 2) Never done   COVID-19 Vaccine (6 - 2023-24 season) 01/18/2022   INFLUENZA VACCINE  10/05/2022    Colorectal cancer screening: No longer required.   Mammogram status: Completed 04/03/22. Repeat every year  Bone Density status: Completed 01/07/20. Results reflect: Bone density results: NORMAL. Repeat every   years.  Lung Cancer Screening: (Low Dose CT Chest recommended if Age 54-80 years, 20 pack-year currently smoking OR have quit w/in 15years.) does not qualify.     Additional Screening:  Hepatitis C Screening: does not qualify; Completed   Vision Screening: Recommended annual ophthalmology exams for early detection of glaucoma and other disorders of the eye. Is the patient up to date with their annual eye exam?  Yes  Who is the provider or what is the name of the office in which the patient attends annual eye exams? [facility] If pt is not established with a  provider, would they like to be referred to a provider to establish care? No .   Dental Screening: Recommended annual dental exams for proper oral hygiene    Community Resource Referral / Chronic Care Management:  CRR required this visit?  No   CCM required this visit?  No     Plan:     I have personally reviewed and noted the following in the patient's chart:   Medical and social history Use of alcohol, tobacco or illicit drugs  Current medications and supplements including opioid prescriptions. Patient is currently taking opioid prescriptions. Information provided to patient regarding non-opioid alternatives. Patient advised to discuss non-opioid treatment plan with their provider. Functional ability and status Nutritional status Physical activity Advanced directives List of other physicians Hospitalizations, surgeries, and ER visits in previous 12 months Vitals Screenings to include cognitive,  depression, and falls Referrals and appointments  In addition, I have reviewed and discussed with patient certain preventive protocols, quality metrics, and best practice recommendations. A written personalized care plan for preventive services as well as general preventive health recommendations were provided to patient.     Tillie Rung, LPN   [id number]   After Visit Summary: (MyChart) Due to this being a telephonic visit, the after visit summary with patients personalized plan was offered to patient via MyChart   Nurse Notes: None

## 2022-10-20 NOTE — Patient Instructions (Signed)
Margaret Gordon , Thank you for taking time to come for your Medicare Wellness Visit. I appreciate your ongoing commitment to your health goals. Please review the following plan we discussed and let me know if I can assist you in the future.   Referrals/Orders/Follow-Ups/Clinician Recommendations:   This is a list of the screening recommended for you and due dates:  Health Maintenance  Topic Date Due   Zoster (Shingles) Vaccine (1 of 2) Never done   COVID-19 Vaccine (6 - 2023-24 season) 01/18/2022   Flu Shot  10/05/2022   Mammogram  04/04/2023   Medicare Annual Wellness Visit  10/20/2023   DTaP/Tdap/Td vaccine (3 - Td or Tdap) 10/09/2030   Pneumonia Vaccine  Completed   DEXA scan (bone density measurement)  Completed   HPV Vaccine  Aged Out   Colon Cancer Screening  Discontinued   Hepatitis C Screening  Discontinued   Opioid Pain Medicine Management Opioids are powerful medicines that are used to treat moderate to severe pain. When used for short periods of time, they can help you to: Sleep better. Do better in physical or occupational therapy. Feel better in the first few days after an injury. Recover from surgery. Opioids should be taken with the supervision of a trained health care provider. They should be taken for the shortest period of time possible. This is because opioids can be addictive, and the longer you take opioids, the greater your risk of addiction. This addiction can also be called opioid use disorder. What are the risks? Using opioid pain medicines for longer than 3 days increases your risk of side effects. Side effects include: Constipation. Nausea and vomiting. Breathing difficulties (respiratory depression). Drowsiness. Confusion. Opioid use disorder. Itching. Taking opioid pain medicine for a long period of time can affect your ability to do daily tasks. It also puts you at risk for: Motor vehicle crashes. Depression. Suicide. Heart attack. Overdose, which  can be life-threatening. What is a pain treatment plan? A pain treatment plan is an agreement between you and your health care provider. Pain is unique to each person, and treatments vary depending on your condition. To manage your pain, you and your health care provider need to work together. To help you do this: Discuss the goals of your treatment, including how much pain you might expect to have and how you will manage the pain. Review the risks and benefits of taking opioid medicines. Remember that a good treatment plan uses more than one approach and minimizes the chance of side effects. Be honest about the amount of medicines you take and about any drug or alcohol use. Get pain medicine prescriptions from only one health care provider. Pain can be managed with many types of alternative treatments. Ask your health care provider to refer you to one or more specialists who can help you manage pain through: Physical or occupational therapy. Counseling (cognitive behavioral therapy). Good nutrition. Biofeedback. Massage. Meditation. Non-opioid medicine. Following a gentle exercise program. How to use opioid pain medicine Taking medicine Take your pain medicine exactly as told by your health care provider. Take it only when you need it. If your pain gets less severe, you may take less than your prescribed dose if your health care provider approves. If you are not having pain, do nottake pain medicine unless your health care provider tells you to take it. If your pain is severe, do nottry to treat it yourself by taking more pills than instructed on your prescription. Contact your health care  provider for help. Write down the times when you take your pain medicine. It is easy to become confused while on pain medicine. Writing the time can help you avoid overdose. Take other over-the-counter or prescription medicines only as told by your health care provider. Keeping yourself and others  safe  While you are taking opioid pain medicine: Do not drive, use machinery, or power tools. Do not sign legal documents. Do not drink alcohol. Do not take sleeping pills. Do not supervise children by yourself. Do not do activities that require climbing or being in high places. Do not go to a lake, river, ocean, spa, or swimming pool. Do not share your pain medicine with anyone. Keep pain medicine in a locked cabinet or in a secure area where pets and children cannot reach it. Stopping your use of opioids If you have been taking opioid medicine for more than a few weeks, you may need to slowly decrease (taper) how much you take until you stop completely. Tapering your use of opioids can decrease your risk of symptoms of withdrawal, such as: Pain and cramping in the abdomen. Nausea. Sweating. Sleepiness. Restlessness. Uncontrollable shaking (tremors). Cravings for the medicine. Do not attempt to taper your use of opioids on your own. Talk with your health care provider about how to do this. Your health care provider may prescribe a step-down schedule based on how much medicine you are taking and how long you have been taking it. Getting rid of leftover pills Do not save any leftover pills. Get rid of leftover pills safely by: Taking the medicine to a prescription take-back program. This is usually offered by the county or law enforcement. Bringing them to a pharmacy that has a drug disposal container. Flushing them down the toilet. Check the label or package insert of your medicine to see whether this is safe to do. Throwing them out in the trash. Check the label or package insert of your medicine to see whether this is safe to do. If it is safe to throw it out, remove the medicine from the original container, put it into a sealable bag or container, and mix it with used coffee grounds, food scraps, dirt, or cat litter before putting it in the trash. Follow these instructions at  home: Activity Do exercises as told by your health care provider. Avoid activities that make your pain worse. Return to your normal activities as told by your health care provider. Ask your health care provider what activities are safe for you. General instructions You may need to take these actions to prevent or treat constipation: Drink enough fluid to keep your urine pale yellow. Take over-the-counter or prescription medicines. Eat foods that are high in fiber, such as beans, whole grains, and fresh fruits and vegetables. Limit foods that are high in fat and processed sugars, such as fried or sweet foods. Keep all follow-up visits. This is important. Where to find support If you have been taking opioids for a long time, you may benefit from receiving support for quitting from a local support group or counselor. Ask your health care provider for a referral to these resources in your area. Where to find more information Centers for Disease Control and Prevention (CDC): FootballExhibition.com.br [place] Food and Drug Administration (FDA): PumpkinSearch.com.ee Get help right away if: You may have taken too much of an opioid (overdosed). Common symptoms of an overdose: Your breathing is slower or more shallow than normal. You have a very slow heartbeat (pulse). You have  slurred speech. You have nausea and vomiting. Your pupils become very small. You have other potential symptoms: You are very confused. You faint or feel like you will faint. You have cold, clammy skin. You have blue lips or fingernails. You have thoughts of harming yourself or harming others. These symptoms may represent a serious problem that is an emergency. Do not wait to see if the symptoms will go away. Get medical help right away. Call your local emergency services (911 in the [place] Do not drive yourself to the hospital.  If you ever feel like you may hurt yourself or others, or have thoughts about taking your own life, get help right away.  Go to your nearest emergency department or: Call your local emergency services (911 in the [place] Call the [facility] ((779)565-9110 in the [place] Call a suicide crisis helpline, such as the National Suicide Prevention Lifeline at 331-862-0569 or 988 in the [place] This is open 24 hours a day in the [place] Text the Crisis Text Line at 936-379-6099 (in the [place] Summary Opioid medicines can help you manage moderate to severe pain for a short period of time. A pain treatment plan is an agreement between you and your health care provider. Discuss the goals of your treatment, including how much pain you might expect to have and how you will manage the pain. If you think that you or someone else may have taken too much of an opioid, get medical help right away. This information is not intended to replace advice given to you by your health care provider. Make sure you discuss any questions you have with your health care provider. Document Revised: 09/15/2020 Document Reviewed: 06/02/2020 Elsevier Patient Education  2024 [employer].  Advanced directives: (In Chart) A copy of your advanced directives are scanned into your chart should your provider ever need it.  Next Medicare Annual Wellness Visit scheduled for next year: Yes  Preventive Care 75 Years and Older, Female Preventive care refers to lifestyle choices and visits with your health care provider that can promote health and wellness. What does preventive care include? A yearly physical exam. This is also called an annual well check. Dental exams once or twice a year. Routine eye exams. Ask your health care provider how often you should have your eyes checked. Personal lifestyle choices, including: Daily care of your teeth and gums. Regular physical activity. Eating a healthy diet. Avoiding tobacco and drug use. Limiting alcohol use. Practicing safe sex. Taking low-dose aspirin every day. Taking vitamin and mineral  supplements as recommended by your health care provider. What happens during an annual well check? The services and screenings done by your health care provider during your annual well check will depend on your age, overall health, lifestyle risk factors, and family history of disease. Counseling  Your health care provider may ask you questions about your: Alcohol use. Tobacco use. Drug use. Emotional well-being. Home and relationship well-being. Sexual activity. Eating habits. History of falls. Memory and ability to understand (cognition). Work and work Astronomer. Reproductive health. Screening  You may have the following tests or measurements: Height, weight, and BMI. Blood pressure. Lipid and cholesterol levels. These may be checked every 5 years, or more frequently if you are over 54 years old. Skin check. Lung cancer screening. You may have this screening every year starting at age 66 if you have a 30-pack-year history of smoking and currently smoke or have quit within the past 15 years. Fecal occult blood  test (FOBT) of the stool. You may have this test every year starting at age 72. Flexible sigmoidoscopy or colonoscopy. You may have a sigmoidoscopy every 5 years or a colonoscopy every 10 years starting at age 32. Hepatitis C blood test. Hepatitis B blood test. Sexually transmitted disease (STD) testing. Diabetes screening. This is done by checking your blood sugar (glucose) after you have not eaten for a while (fasting). You may have this done every 1-3 years. Bone density scan. This is done to screen for osteoporosis. You may have this done starting at age 94. Mammogram. This may be done every 1-2 years. Talk to your health care provider about how often you should have regular mammograms. Talk with your health care provider about your test results, treatment options, and if necessary, the need for more tests. Vaccines  Your health care provider may recommend certain  vaccines, such as: Influenza vaccine. This is recommended every year. Tetanus, diphtheria, and acellular pertussis (Tdap, Td) vaccine. You may need a Td booster every 10 years. Zoster vaccine. You may need this after age 20. Pneumococcal 13-valent conjugate (PCV13) vaccine. One dose is recommended after age 7. Pneumococcal polysaccharide (PPSV23) vaccine. One dose is recommended after age 50. Talk to your health care provider about which screenings and vaccines you need and how often you need them. This information is not intended to replace advice given to you by your health care provider. Make sure you discuss any questions you have with your health care provider. Document Released: 03/19/2015 Document Revised: 11/10/2015 Document Reviewed: 12/22/2014 Elsevier Interactive Patient Education  2017 [employer].  Fall Prevention in the Home Falls can cause injuries. They can happen to people of all ages. There are many things you can do to make your home safe and to help prevent falls. What can I do on the outside of my home? Regularly fix the edges of walkways and driveways and fix any cracks. Remove anything that might make you trip as you walk through a door, such as a raised step or threshold. Trim any bushes or trees on the path to your home. Use bright outdoor lighting. Clear any walking paths of anything that might make someone trip, such as rocks or tools. Regularly check to see if handrails are loose or broken. Make sure that both sides of any steps have handrails. Any raised decks and porches should have guardrails on the edges. Have any leaves, snow, or ice cleared regularly. Use sand or salt on walking paths during winter. Clean up any spills in your garage right away. This includes oil or grease spills. What can I do in the bathroom? Use night lights. Install grab bars by the toilet and in the tub and shower. Do not use towel bars as grab bars. Use non-skid mats or decals in  the tub or shower. If you need to sit down in the shower, use a plastic, non-slip stool. Keep the floor dry. Clean up any water that spills on the floor as soon as it happens. Remove soap buildup in the tub or shower regularly. Attach bath mats securely with double-sided non-slip rug tape. Do not have throw rugs and other things on the floor that can make you trip. What can I do in the bedroom? Use night lights. Make sure that you have a light by your bed that is easy to reach. Do not use any sheets or blankets that are too big for your bed. They should not hang down onto the floor. Have  a firm chair that has side arms. You can use this for support while you get dressed. Do not have throw rugs and other things on the floor that can make you trip. What can I do in the kitchen? Clean up any spills right away. Avoid walking on wet floors. Keep items that you use a lot in easy-to-reach places. If you need to reach something above you, use a strong step stool that has a grab bar. Keep electrical cords out of the way. Do not use floor polish or wax that makes floors slippery. If you must use wax, use non-skid floor wax. Do not have throw rugs and other things on the floor that can make you trip. What can I do with my stairs? Do not leave any items on the stairs. Make sure that there are handrails on both sides of the stairs and use them. Fix handrails that are broken or loose. Make sure that handrails are as long as the stairways. Check any carpeting to make sure that it is firmly attached to the stairs. Fix any carpet that is loose or worn. Avoid having throw rugs at the top or bottom of the stairs. If you do have throw rugs, attach them to the floor with carpet tape. Make sure that you have a light switch at the top of the stairs and the bottom of the stairs. If you do not have them, ask someone to add them for you. What else can I do to help prevent falls? Wear shoes that: Do not have high  heels. Have rubber bottoms. Are comfortable and fit you well. Are closed at the toe. Do not wear sandals. If you use a stepladder: Make sure that it is fully opened. Do not climb a closed stepladder. Make sure that both sides of the stepladder are locked into place. Ask someone to hold it for you, if possible. Clearly mark and make sure that you can see: Any grab bars or handrails. First and last steps. Where the edge of each step is. Use tools that help you move around (mobility aids) if they are needed. These include: Canes. Walkers. Scooters. Crutches. Turn on the lights when you go into a dark area. Replace any light bulbs as soon as they burn out. Set up your furniture so you have a clear path. Avoid moving your furniture around. If any of your floors are uneven, fix them. If there are any pets around you, be aware of where they are. Review your medicines with your doctor. Some medicines can make you feel dizzy. This can increase your chance of falling. Ask your doctor what other things that you can do to help prevent falls. This information is not intended to replace advice given to you by your health care provider. Make sure you discuss any questions you have with your health care provider. Document Released: 12/17/2008 Document Revised: 07/29/2015 Document Reviewed: 03/27/2014 Elsevier Interactive Patient Education  2017 [employer].

## 2022-11-02 ENCOUNTER — Emergency Department (HOSPITAL_BASED_OUTPATIENT_CLINIC_OR_DEPARTMENT_OTHER): Payer: No Typology Code available for payment source | Admitting: Radiology

## 2022-11-02 ENCOUNTER — Emergency Department (HOSPITAL_BASED_OUTPATIENT_CLINIC_OR_DEPARTMENT_OTHER)
Admission: EM | Admit: 2022-11-02 | Discharge: 2022-11-02 | Disposition: A | Discharge status: Home / Self Care | Payer: No Typology Code available for payment source | Attending: Emergency Medicine | Admitting: Emergency Medicine

## 2022-11-02 ENCOUNTER — Telehealth: Payer: Self-pay | Admitting: Family Medicine

## 2022-11-02 ENCOUNTER — Other Ambulatory Visit: Payer: Self-pay

## 2022-11-02 DIAGNOSIS — R600 Localized edema: Secondary | ICD-10-CM | POA: Insufficient documentation

## 2022-11-02 DIAGNOSIS — I10 Essential (primary) hypertension: Secondary | ICD-10-CM | POA: Insufficient documentation

## 2022-11-02 DIAGNOSIS — I872 Venous insufficiency (chronic) (peripheral): Secondary | ICD-10-CM | POA: Insufficient documentation

## 2022-11-02 DIAGNOSIS — M7989 Other specified soft tissue disorders: Secondary | ICD-10-CM | POA: Diagnosis not present

## 2022-11-02 DIAGNOSIS — Z79899 Other long term (current) drug therapy: Secondary | ICD-10-CM | POA: Insufficient documentation

## 2022-11-02 DIAGNOSIS — Z20822 Contact with and (suspected) exposure to covid-19: Secondary | ICD-10-CM | POA: Diagnosis not present

## 2022-11-02 DIAGNOSIS — R0600 Dyspnea, unspecified: Secondary | ICD-10-CM | POA: Diagnosis not present

## 2022-11-02 LAB — COMPREHENSIVE METABOLIC PANEL
ALT: 13 U/L (ref 0–44)
AST: 24 U/L (ref 15–41)
Albumin: 4 g/dL (ref 3.5–5.0)
Alkaline Phosphatase: 84 U/L (ref 38–126)
Anion gap: 6 (ref 5–15)
BUN: 23 mg/dL (ref 8–23)
CO2: 34 mmol/L — ABNORMAL HIGH (ref 22–32)
Calcium: 9.5 mg/dL (ref 8.9–10.3)
Chloride: 102 mmol/L (ref 98–111)
Creatinine, Ser: 1.28 mg/dL — ABNORMAL HIGH (ref 0.44–1.00)
GFR, Estimated: 42 mL/min — ABNORMAL LOW (ref >60)
Glucose, Bld: 101 mg/dL — ABNORMAL HIGH (ref 70–99)
Potassium: 3.7 mmol/L (ref 3.5–5.1)
Sodium: 142 mmol/L (ref 135–145)
Total Bilirubin: 0.7 mg/dL (ref 0.3–1.2)
Total Protein: 7.3 g/dL (ref 6.5–8.1)

## 2022-11-02 LAB — BRAIN NATRIURETIC PEPTIDE: B Natriuretic Peptide: 48.8 pg/mL (ref 0.0–100.0)

## 2022-11-02 LAB — MAGNESIUM: Magnesium: 1.9 mg/dL (ref 1.7–2.4)

## 2022-11-02 LAB — CBC
HCT: 36.3 % (ref 36.0–46.0)
Hemoglobin: 11.1 g/dL — ABNORMAL LOW (ref 12.0–15.0)
MCH: 22.8 pg — ABNORMAL LOW (ref 26.0–34.0)
MCHC: 30.6 g/dL (ref 30.0–36.0)
MCV: 74.5 fL — ABNORMAL LOW (ref 80.0–100.0)
Platelets: 166 10*3/uL (ref 150–400)
RBC: 4.87 MIL/uL (ref 3.87–5.11)
RDW: 14.8 % (ref 11.5–15.5)
WBC: 7.5 10*3/uL (ref 4.0–10.5)
nRBC: 0 % (ref 0.0–0.2)

## 2022-11-02 LAB — SARS CORONAVIRUS 2 BY RT PCR: SARS Coronavirus 2 by RT PCR: NEGATIVE

## 2022-11-02 MED ORDER — FUROSEMIDE 10 MG/ML IJ SOLN: 20.0000 mg | INTRAMUSCULAR | Status: DC

## 2022-11-02 NOTE — Discharge Instructions (Signed)
You were seen for your leg swelling in the emergency department.   At home, please use the compression stockings that you have and take your Lasix twice a day until your leg swelling improves.    Check your MyChart online for the results of any tests that had not resulted by the time you left the emergency department.   Follow-up with your primary doctor in 5-7 days regarding your visit.  Please have them recheck your kidney function.  Return immediately to the emergency department if you experience any of the following: Chest pain, shortness of breath, or any other concerning symptoms.    Thank you for visiting our Emergency Department. It was a pleasure taking care of you today.

## 2022-11-02 NOTE — ED Provider Notes (Signed)
[facility] AT [facility] Provider Note   CSN: [id number] Arrival date & time: 11/02/22  1232     History  Chief Complaint  Patient presents with   Leg Swelling    Margaret Gordon is a 82 y.o. female.  82 year old female with a history of hypertension, hyperlipidemia, and lower extremity edema on Lasix who presents to the emergency department with lower extremity swelling.  Says that for the past 2 weeks has had increased swelling of her legs.  Also has noticed some darkening and discoloration of them as well.  No pain.  Has had dry cough and dyspnea on exertion during this time as well.  Unsure weight gain.  No PND.  No chest pain.  Says she has been taking her Lasix 20 mg daily as well as using compression stockings for the swelling.  Called the nursing line from her PCP referred her to the emergency department.       Home Medications Prior to Admission medications   Medication Sig Start Date End Date Taking? Authorizing Provider  amLODipine (NORVASC) 5 MG tablet TAKE 1 TABLET (5 MG TOTAL) BY MOUTH DAILY. 10/04/22   Bedsole, Amy E, MD  amoxicillin-clavulanate (AUGMENTIN) 875-125 MG tablet Take 1 tablet by mouth 2 (two) times daily. 06/28/22   Bedsole, Amy E, MD  atorvastatin (LIPITOR) 10 MG tablet Take 1 tablet (10 mg total) by mouth daily. 04/24/22   Bedsole, Amy E, MD  furosemide (LASIX) 20 MG tablet TAKE 1 TABLET BY MOUTH EVERY DAY 10/04/22   Bedsole, Amy E, MD  gabapentin (NEURONTIN) 100 MG capsule Take 100 mg by mouth 2 (two) times daily. 04/03/22   [provider]  meclizine (ANTIVERT) 25 MG tablet Take 1 tablet (25 mg total) by mouth daily as needed for dizziness. 02/08/22   Excell Seltzer, MD  Multiple Vitamin (MULTIVITAMIN WITH MINERALS) TABS tablet Take 1 tablet by mouth at bedtime.    [provider]  pantoprazole (PROTONIX) 40 MG tablet TAKE 1 TABLET BY MOUTH EVERY DAY 09/04/22   Bedsole, Amy E, MD  predniSONE (DELTASONE) 10 MG  tablet 3 tabs by mouth daily x 3 days, then 2 tabs by mouth daily x 2 days then 1 tab by mouth daily x 2 days Patient not taking: Reported on 08/10/2022 06/28/22   Excell Seltzer, MD  telmisartan-hydrochlorothiazide (MICARDIS HCT) 40-12.5 MG tablet Take 1 tablet by mouth daily. 04/24/22   Bedsole, Amy E, MD  traMADol (ULTRAM) 50 MG tablet Take 1 tablet (50 mg total) by mouth every 6 (six) hours as needed. 01/05/22   Excell Seltzer, MD      Allergies    Patient has no known allergies.    Review of Systems   Review of Systems  Physical Exam Updated Vital Signs BP (!) 157/85 (BP Location: Right Arm)   Pulse 68   Temp 98 F (36.7 C) (Oral)   Resp 18   SpO2 100%  Physical Exam Vitals and nursing note reviewed.  Constitutional:      General: She is not in acute distress.    Appearance: She is well-developed.  HENT:     Head: Normocephalic and atraumatic.     Right Ear: External ear normal.     Left Ear: External ear normal.     Nose: Nose normal.  Eyes:     Extraocular Movements: Extraocular movements intact.     Conjunctiva/sclera: Conjunctivae normal.     Pupils: Pupils are equal, round,  and reactive to light.  Cardiovascular:     Rate and Rhythm: Normal rate and regular rhythm.     Heart sounds: No murmur heard. Pulmonary:     Effort: Pulmonary effort is normal. No respiratory distress.     Breath sounds: Normal breath sounds.  Musculoskeletal:     Cervical back: Normal range of motion and neck supple.     Right lower leg: Edema (2+) present.     Left lower leg: Edema (2+) present.  Skin:    General: Skin is warm and dry.  Neurological:     Mental Status: She is alert and oriented to person, place, and time. Mental status is at baseline.  Psychiatric:        Mood and Affect: Mood normal.     ED Results / Procedures / Treatments   Labs (all labs ordered are listed, but only abnormal results are displayed) Labs Reviewed  CBC - Abnormal; Notable for the following  components:      Result Value   Hemoglobin 11.1 (*)    MCV 74.5 (*)    MCH 22.8 (*)    All other components within normal limits  COMPREHENSIVE METABOLIC PANEL - Abnormal; Notable for the following components:   CO2 34 (*)    Glucose, Bld 101 (*)    Creatinine, Ser 1.28 (*)    GFR, Estimated 42 (*)    All other components within normal limits  SARS CORONAVIRUS 2 BY RT PCR  BRAIN NATRIURETIC PEPTIDE  MAGNESIUM    EKG None  Radiology DG Chest 2 View  Result Date: 11/02/2022 CLINICAL DATA:  Bilateral leg swelling and discoloration. Dyspnea on exertion. EXAM: CHEST - 2 VIEW COMPARISON:  10/30/2016 FINDINGS: Chronic lingular scarring. The lungs appear otherwise clear. Mild enlargement of the cardiopericardial silhouette, without edema. No blunting of the posterior costophrenic angles. Mild dextroconvex thoracic and levoconvex lumbar scoliosis. IMPRESSION: 1. Mild enlargement of the cardiopericardial silhouette, without edema. 2. Chronic lingular scarring. 3. Mild scoliosis. Electronically Signed   By: Gaylyn Rong M.D.   On: 11/02/2022 14:35    Procedures Procedures    Medications Ordered in ED Medications - No data to display  ED Course/ Medical Decision Making/ A&P                                 Medical Decision Making Amount and/or Complexity of Data Reviewed Labs: ordered. Radiology: ordered.   Margaret Gordon is a 82 y.o. female with comorbidities that complicate the patient evaluation including  hypertension, hyperlipidemia, and lower extremity edema on Lasix who presents to the emergency department with lower extremity swelling.     Initial Ddx:  Volume overload, heart failure, venous insufficiency/venous stasis, URI  MDM/Course:  Patient presents emergency department with bilateral lower extremity swelling and some shortness of breath.  Does appear to have bilateral lower extremity edema that is mild.  No erythema that would suggest infection that is  superimposed on this.  She had lab work today which showed normal BMP and mildly elevated creatinine but does not meet criteria for AKI at this time and BUN was normal.  Her COVID was negative.  Chest x-ray without any pulmonary edema and she was satting well on room air.  Upon re-evaluation patient remained stable.  Feel that is likely due to venous insufficiency causing her lower extremity edema.  Will have her increase her Lasix to twice daily until her  swelling improves and continue wearing her compression stockings since I do not see an indication for admission today.  With her bump in creatinine have instructed her to follow-up with her primary doctor within the next week to have repeat lab work drawn and to be reevaluated.  This patient presents to the ED for concern of complaints listed in HPI, this involves an extensive number of treatment options, and is a complaint that carries with it a high risk of complications and morbidity. Disposition including potential need for admission considered.   Dispo: DC Home. Return precautions discussed including, but not limited to, those listed in the AVS. Allowed pt time to ask questions which were answered fully prior to dc.  Additional history obtained from spouse Records reviewed Outpatient Clinic Notes The following labs were independently interpreted: Chemistry and show CKD I independently reviewed the following imaging with scope of interpretation limited to determining acute life threatening conditions related to emergency care: Chest x-ray and agree with the radiologist interpretation with the following exceptions: none I personally reviewed and interpreted cardiac monitoring: normal sinus rhythm  I personally reviewed and interpreted the pt's EKG: see above for interpretation  I have reviewed the patients home medications and made adjustments as needed Social Determinants of health:  Elderly         Final Clinical Impression(s) / ED  Diagnoses Final diagnoses:  Venous insufficiency  Bilateral lower extremity edema    Rx / DC Orders ED Discharge Orders     None         Rondel Baton, MD 11/02/22 1757

## 2022-11-02 NOTE — ED Triage Notes (Signed)
Pt reports worsening bilateral leg swelling for two weeks with DOE. Pt denies CP, hx of DVT, no blood thinners.

## 2022-11-02 NOTE — ED Notes (Signed)
Patient ambulatory to and from restroom with steady gait.

## 2022-11-02 NOTE — Telephone Encounter (Signed)
FYI: This call has been transferred to Access Nurse. Once the result note has been entered staff can address the message at that time.  Patient called in with the following symptoms:  Red Word: swelling in legs, ankle, and feet with discoloration   Please advise at Mobile [phone] (mobile)  Message is routed to Provider Pool and [facility] Triage

## 2022-11-02 NOTE — Telephone Encounter (Signed)
Per chart review tab pt is at [facility] ED. Sending to Dr Ermalene Searing as Lorain Childes.

## 2022-11-02 NOTE — Telephone Encounter (Signed)
Per Access nurse note pt instructed to go to ED.  As of 12:17p, pt has not checked into [facility] ED yet.

## 2022-11-03 ENCOUNTER — Encounter: Payer: Self-pay | Admitting: Family Medicine

## 2022-11-03 ENCOUNTER — Ambulatory Visit (INDEPENDENT_AMBULATORY_CARE_PROVIDER_SITE_OTHER): Payer: No Typology Code available for payment source | Admitting: Family Medicine

## 2022-11-03 VITALS — BP 102/70 | HR 92 | Temp 98.0°F | Ht 67.0 in | Wt 231.0 lb

## 2022-11-03 DIAGNOSIS — G8929 Other chronic pain: Secondary | ICD-10-CM | POA: Diagnosis not present

## 2022-11-03 DIAGNOSIS — R600 Localized edema: Secondary | ICD-10-CM | POA: Diagnosis not present

## 2022-11-03 DIAGNOSIS — I10 Essential (primary) hypertension: Secondary | ICD-10-CM

## 2022-11-03 DIAGNOSIS — I872 Venous insufficiency (chronic) (peripheral): Secondary | ICD-10-CM

## 2022-11-03 DIAGNOSIS — R7989 Other specified abnormal findings of blood chemistry: Secondary | ICD-10-CM | POA: Diagnosis not present

## 2022-11-03 DIAGNOSIS — M25511 Pain in right shoulder: Secondary | ICD-10-CM

## 2022-11-03 NOTE — Assessment & Plan Note (Signed)
Chronic, improved control from yesterday's elevation.  Elevation likely due to anxiety and stress of symptoms as she has not yet taken Lasix.

## 2022-11-03 NOTE — Assessment & Plan Note (Signed)
Chronic with acute worsening ER workup unremarkable, no sign of heart failure.  Agree with recommendations to do Lasix 20 mg x 2 daily, will have her do this for the next 4 to 5 days.  Decrease salt in diet, increase potassium, avoid alcohol.  Elevate legs and continue wearing compression hose.  Close follow-up with labs to reevaluate basic metabolic panel including potassium and kidney function next week.  Return and ER precautions provided

## 2022-11-03 NOTE — Assessment & Plan Note (Signed)
Chronic right shoulder pain now with acute flare.  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 No new fall. Recommend starting physical therapy and treating pain with Tylenol 652 tablets twice daily. Will go ahead and have her follow-up with sports medicine for likely x-ray and possible steroid injection.

## 2022-11-03 NOTE — Progress Notes (Signed)
Patient ID: Margaret Gordon, female    DOB: 1940/12/02, 82 y.o.   MRN: [id number]  This visit was conducted in person.  BP 102/70 (BP Location: Left Arm, Patient Position: Sitting, Cuff Size: Large)   Pulse 92   Temp 98 F (36.7 C) (Temporal)   Ht 5\' 7"  (1.702 m)   Wt 231 lb (104.8 kg)   SpO2 96%   BMI 36.18 kg/m    CC:  Chief Complaint  Patient presents with   Hospitalization Follow-up    ER Visit 11/02/2022-   Shoulder Pain    Right    Subjective:   HPI: TYRICA STETSER is a 82 y.o. female with history of hypertension hyperlipidemia and lower extremity edema history on Lasix presenting on 11/03/2022 for Hospitalization Follow-up (ER Visit 11/02/2022-) and Shoulder Pain (Right)  She reports recent worsening of chronic swelling in the last 2 weeks swelling in feet ankles and lower legs.  She has also noted discoloration.  Occurring despite Lasix use compression stockings and elevation. She had also noted some increase in shortness of breath. She was seen at [facility] emergency room yesterday per access nurse recommendations. ED note reviewed Normal BNP, mildly elevated creatinine.  COVID test negative.  Chest x-ray without infection or edema.  No hypoxia. Diagnosed with venous insufficiency.  Recommended increasing Lasix to twice daily.  Today she reports  she has not yet taken higher dose of  lasix yet.  Swelling in legs somewhat better anyway.  SOB/chest pressure with leaning forward and some with exertion   No known trigger of swelling..  some ETOH ( 2 in last week, more than usual), no travel, she does eat  some salt . BP Readings from Last 3 Encounters:  11/03/22 102/70  11/02/22 (!) 157/85  08/10/22 108/72   She has also Having recurrent stiffness  in right shoulder... noted couldn't raise arms above head during CXR yesterday.  Lifting past 90  and external rotation causes pain.  No recent falls or injuries. Using OTC  ibuprofen 400 mg   daily alternating  with tylenol.  HX of frozen shoulder.      Relevant past medical, surgical, family and social history reviewed and updated as indicated. Interim medical history since our last visit reviewed. Allergies and medications reviewed and updated. Outpatient Medications Prior to Visit  Medication Sig Dispense Refill   amLODipine (NORVASC) 5 MG tablet TAKE 1 TABLET (5 MG TOTAL) BY MOUTH DAILY. 90 tablet 1   atorvastatin (LIPITOR) 10 MG tablet Take 1 tablet (10 mg total) by mouth daily. 90 tablet 3   furosemide (LASIX) 20 MG tablet TAKE 1 TABLET BY MOUTH EVERY DAY 90 tablet 1   gabapentin (NEURONTIN) 100 MG capsule Take 100 mg by mouth 2 (two) times daily.     meclizine (ANTIVERT) 25 MG tablet Take 1 tablet (25 mg total) by mouth daily as needed for dizziness. 30 tablet 5   Multiple Vitamin (MULTIVITAMIN WITH MINERALS) TABS tablet Take 1 tablet by mouth at bedtime.     pantoprazole (PROTONIX) 40 MG tablet TAKE 1 TABLET BY MOUTH EVERY DAY 90 tablet 1   telmisartan-hydrochlorothiazide (MICARDIS HCT) 40-12.5 MG tablet Take 1 tablet by mouth daily. 90 tablet 1   traMADol (ULTRAM) 50 MG tablet Take 1 tablet (50 mg total) by mouth every 6 (six) hours as needed. 30 tablet 0   amoxicillin-clavulanate (AUGMENTIN) 875-125 MG tablet Take 1 tablet by mouth 2 (two) times daily. 20 tablet 0   predniSONE (  DELTASONE) 10 MG tablet 3 tabs by mouth daily x 3 days, then 2 tabs by mouth daily x 2 days then 1 tab by mouth daily x 2 days (Patient not taking: Reported on 08/10/2022) 15 tablet 0   No facility-administered medications prior to visit.     Per HPI unless specifically indicated in ROS section below Review of Systems  Constitutional:  Negative for fatigue and fever.  HENT:  Positive for rhinorrhea. Negative for congestion.   Eyes:  Negative for pain.  Respiratory:  Negative for cough and shortness of breath.   Cardiovascular:  Negative for chest pain, palpitations and leg swelling.  Gastrointestinal:  Negative  for abdominal pain.  Genitourinary:  Negative for dysuria and vaginal bleeding.  Musculoskeletal:  Negative for back pain.  Neurological:  Negative for syncope, light-headedness and headaches.  Psychiatric/Behavioral:  Negative for dysphoric mood.    Objective:  BP 102/70 (BP Location: Left Arm, Patient Position: Sitting, Cuff Size: Large)   Pulse 92   Temp 98 F (36.7 C) (Temporal)   Ht 5\' 7"  (1.702 m)   Wt 231 lb (104.8 kg)   SpO2 96%   BMI 36.18 kg/m   Wt Readings from Last 3 Encounters:  11/03/22 231 lb (104.8 kg)  10/20/22 223 lb (101.2 kg)  08/10/22 223 lb 4 oz (101.3 kg)      Physical Exam Constitutional:      General: She is not in acute distress.    Appearance: Normal appearance. She is well-developed. She is not ill-appearing or toxic-appearing.  HENT:     Head: Normocephalic.     Right Ear: Hearing, tympanic membrane, ear canal and external ear normal. Tympanic membrane is not erythematous, retracted or bulging.     Left Ear: Hearing, tympanic membrane, ear canal and external ear normal. Tympanic membrane is not erythematous, retracted or bulging.     Nose: No mucosal edema or rhinorrhea.     Right Sinus: No maxillary sinus tenderness or frontal sinus tenderness.     Left Sinus: No maxillary sinus tenderness or frontal sinus tenderness.     Mouth/Throat:     Mouth: Oropharynx is clear and moist and mucous membranes are normal.     Pharynx: Uvula midline.  Eyes:     General: Lids are normal. Lids are everted, no foreign bodies appreciated.     Extraocular Movements: EOM normal.     Conjunctiva/sclera: Conjunctivae normal.     Pupils: Pupils are equal, round, and reactive to light.  Neck:     Thyroid: No thyroid mass or thyromegaly.     Vascular: No carotid bruit.     Trachea: Trachea normal.  Cardiovascular:     Rate and Rhythm: Normal rate and regular rhythm.     Pulses: Normal pulses.     Heart sounds: Normal heart sounds, S1 normal and S2 normal. No murmur  heard.    No friction rub. No gallop.  Pulmonary:     Effort: Pulmonary effort is normal. No tachypnea or respiratory distress.     Breath sounds: Examination of the right-lower field reveals rales. Examination of the left-lower field reveals rales. Rales present. No decreased breath sounds, wheezing or rhonchi.  Abdominal:     General: Bowel sounds are normal.     Palpations: Abdomen is soft.     Tenderness: There is no abdominal tenderness.  Musculoskeletal:     Right shoulder: Tenderness and bony tenderness present. No swelling or crepitus. Decreased range of motion. Normal strength. Normal  pulse.     Left shoulder: Normal.     Cervical back: Normal range of motion and neck supple.     Right lower leg: 2+ Edema present.     Left lower leg: 2+ Edema present.  Skin:    General: Skin is warm, dry and intact.     Findings: No rash.  Neurological:     Mental Status: She is alert.  Psychiatric:        Mood and Affect: Mood is not anxious or depressed.        Speech: Speech normal.        Behavior: Behavior normal. Behavior is cooperative.        Thought Content: Thought content normal.        Cognition and Memory: Cognition and memory normal.        Judgment: Judgment normal.       Results for orders placed or performed during the hospital encounter of 11/02/22  SARS Coronavirus 2 by RT PCR (hospital order, performed in [facility] hospital lab) *cepheid single result test* Anterior Nasal Swab   Specimen: Anterior Nasal Swab  Result Value Ref Range   SARS Coronavirus 2 by RT PCR NEGATIVE NEGATIVE  CBC  Result Value Ref Range   WBC 7.5 4.0 - 10.5 K/uL   RBC 4.87 3.87 - 5.11 MIL/uL   Hemoglobin 11.1 (L) 12.0 - 15.0 g/dL   HCT 84.1 32.4 - 40.1 %   MCV 74.5 (L) 80.0 - 100.0 fL   MCH 22.8 (L) 26.0 - 34.0 pg   MCHC 30.6 30.0 - 36.0 g/dL   RDW 02.7 25.3 - 66.4 %   Platelets 166 150 - 400 K/uL   nRBC 0.0 0.0 - 0.2 %  Comprehensive metabolic panel  Result Value Ref Range   Sodium  142 135 - 145 mmol/L   Potassium 3.7 3.5 - 5.1 mmol/L   Chloride 102 98 - 111 mmol/L   CO2 34 (H) 22 - 32 mmol/L   Glucose, Bld 101 (H) 70 - 99 mg/dL   BUN 23 8 - 23 mg/dL   Creatinine, Ser 4.03 (H) 0.44 - 1.00 mg/dL   Calcium 9.5 8.9 - 47.4 mg/dL   Total Protein 7.3 6.5 - 8.1 g/dL   Albumin 4.0 3.5 - 5.0 g/dL   AST 24 15 - 41 U/L   ALT 13 0 - 44 U/L   Alkaline Phosphatase 84 38 - 126 U/L   Total Bilirubin 0.7 0.3 - 1.2 mg/dL   GFR, Estimated 42 (L) >60 mL/min   Anion gap 6 5 - 15  Brain natriuretic peptide  Result Value Ref Range   B Natriuretic Peptide 48.8 0.0 - 100.0 pg/mL  Magnesium  Result Value Ref Range   Magnesium 1.9 1.7 - 2.4 mg/dL    Assessment and Plan  Peripheral edema Assessment & Plan: Chronic with acute worsening ER workup unremarkable, no sign of heart failure.  Agree with recommendations to do Lasix 20 mg x 2 daily, will have her do this for the next 4 to 5 days.  Decrease salt in diet, increase potassium, avoid alcohol.  Elevate legs and continue wearing compression hose.  Close follow-up with labs to reevaluate basic metabolic panel including potassium and kidney function next week.  Return and ER precautions provided  Orders: -     Basic metabolic panel; Future -     TSH; Future  Venous insufficiency Assessment & Plan: Chronic, this is likely cause of peripheral swelling and  discoloration.  Acute worsening potentially due to heat, inactivity, higher salt diet and to drinks of alcohol last week that are unusual for her.   Primary hypertension Assessment & Plan: Chronic, improved control from yesterday's elevation.  Elevation likely due to anxiety and stress of symptoms as she has not yet taken Lasix.   Chronic right shoulder pain Assessment & Plan: Chronic right shoulder pain now with acute flare.  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 No new fall. Recommend starting physical therapy and treating pain with Tylenol 652 tablets twice  daily. Will go ahead and have her follow-up with sports medicine for likely x-ray and possible steroid injection.   Blood creatinine decreased compared with prior measurement Assessment & Plan: Acute, this most likely was secondary to recent ibuprofen use for shoulder.  I have encouraged her to avoid NSAIDs and use acetaminophen for pain instead.     Return in about 4 days (around 11/07/2022) for lab only,  ALSO make appt for right shoulder pain with Dr.Copland.   Kerby Nora, MD

## 2022-11-03 NOTE — Assessment & Plan Note (Signed)
Acute, this most likely was secondary to recent ibuprofen use for shoulder.  I have encouraged her to avoid NSAIDs and use acetaminophen for pain instead.

## 2022-11-03 NOTE — Assessment & Plan Note (Signed)
Chronic, this is likely cause of peripheral swelling and discoloration.  Acute worsening potentially due to heat, inactivity, higher salt diet and to drinks of alcohol last week that are unusual for her.

## 2022-11-03 NOTE — Patient Instructions (Addendum)
Continue working on lower salt diet.  Keep up with water intake.  Continue lasix 20 mg  twice daily until lab follow up next week.  Elevating leg and using compression hose.  Stop ibuprofen instead use 2 tabs of  acetaminophen twice daily for pain.

## 2022-11-08 ENCOUNTER — Ambulatory Visit: Payer: No Typology Code available for payment source | Admitting: Family Medicine

## 2022-11-08 ENCOUNTER — Other Ambulatory Visit (INDEPENDENT_AMBULATORY_CARE_PROVIDER_SITE_OTHER): Payer: No Typology Code available for payment source

## 2022-11-08 DIAGNOSIS — R600 Localized edema: Secondary | ICD-10-CM | POA: Diagnosis not present

## 2022-11-08 LAB — BASIC METABOLIC PANEL
BUN: 18 mg/dL (ref 6–23)
CO2: 35 meq/L — ABNORMAL HIGH (ref 19–32)
Calcium: 9.6 mg/dL (ref 8.4–10.5)
Chloride: 102 meq/L (ref 96–112)
Creatinine, Ser: 1.22 mg/dL — ABNORMAL HIGH (ref 0.40–1.20)
GFR: 41.98 mL/min — ABNORMAL LOW (ref >60.00)
Glucose, Bld: 105 mg/dL — ABNORMAL HIGH (ref 70–99)
Potassium: 3.8 meq/L (ref 3.5–5.1)
Sodium: 144 meq/L (ref 135–145)

## 2022-11-08 LAB — TSH: TSH: 0.93 u[IU]/mL (ref 0.35–5.50)

## 2022-11-10 ENCOUNTER — Other Ambulatory Visit: Payer: Self-pay | Admitting: Family Medicine

## 2022-11-10 NOTE — Telephone Encounter (Signed)
Last office visit 11/03/2022 for hospital follow up.  Last refilled 02/08/22 for #30 with 5 refills.  Next Appt; 11/20/2022 with Dr. Patsy Lager for shoulder pain.

## 2022-11-13 ENCOUNTER — Other Ambulatory Visit: Payer: Self-pay | Admitting: Family Medicine

## 2022-11-13 ENCOUNTER — Encounter: Payer: Self-pay | Admitting: Family Medicine

## 2022-11-13 NOTE — Telephone Encounter (Signed)
Last office visit 11/03/2022 for hosptial follow up.  Last refilled 01/05/22 for #30 with no refills.  Next Appt: 11/20/2022 with Dr. Patsy Lager for right shoulder pain.

## 2022-11-14 MED ORDER — TRAMADOL HCL 50 MG PO TABS: 50.0000 mg | ORAL_TABLET | Freq: Four times a day (QID) | ORAL | 0 refills | Status: DC | PRN

## 2022-11-15 ENCOUNTER — Telehealth: Payer: Self-pay

## 2022-11-15 NOTE — Telephone Encounter (Signed)
Transition Care Management Follow-up Telephone Call Date of discharge and from where: 11/02/2022 [facility] How have you been since you were released from the hospital? Patient stated she is feeling much better. Any questions or concerns? No  Items Reviewed: Did the pt receive and understand the discharge instructions provided? Yes  Medications obtained and verified?  No medications prescribed. Other? No  Any new allergies since your discharge? No  Dietary orders reviewed? Yes Do you have support at home? Yes   Follow up appointments reviewed:  PCP Hospital f/u appt confirmed? Yes  Scheduled to see Excell Seltzer, MD on 11/03/2022 @ [facility] [place]. [facility] f/u appt confirmed? No  Scheduled to see  on  @ . Are transportation arrangements needed? No  If their condition worsens, is the pt aware to call PCP or go to the Emergency Dept.? Yes Was the patient provided with contact information for the PCP's office or ED? Yes Was to pt encouraged to call back with questions or concerns? Yes  Bernetha Anschutz [facility]  [facility], [facility] Guide Direct Dial: [phone]  Website: [email or web address]

## 2022-11-18 NOTE — Progress Notes (Unsigned)
Falisa Lamora T. Liston Thum, MD, CAQ Sports Medicine [facility] [address] [place], [address]  Phone: [phone]  FAX: [phone]  JAYLANNIE VIDA - 82 y.o. female  MRN [id number]  Date of Birth: 19-Jul-1940  Date: 11/20/2022  PCP: Excell Seltzer, MD  Referral: Excell Seltzer, MD  No chief complaint on file.  Subjective:   TAVEN WOODIS is a 82 y.o. very pleasant female patient with There is no height or weight on file to calculate BMI. who presents with the following:  The patient is an 82 year old who is a new patient to me presents with some ongoing right-sided shoulder pain.  She does have a history of having intermittent shoulder problems, and right now she is having some restriction of motion in all directions.  Of note, when she tried to have a chest x-ray recently she was unable to elevate her arms to complete a complete exam.    Review of Systems is noted in the HPI, as appropriate  Objective:   There were no vitals taken for this visit.  GEN: No acute distress; alert,appropriate. PULM: Breathing comfortably in no respiratory distress PSYCH: Normally interactive.   Laboratory and Imaging Data:  Assessment and Plan:   ***

## 2022-11-20 ENCOUNTER — Ambulatory Visit (INDEPENDENT_AMBULATORY_CARE_PROVIDER_SITE_OTHER): Payer: No Typology Code available for payment source | Admitting: Family Medicine

## 2022-11-20 ENCOUNTER — Encounter: Payer: Self-pay | Admitting: Family Medicine

## 2022-11-20 ENCOUNTER — Ambulatory Visit
Admission: RE | Admit: 2022-11-20 | Discharge: 2022-11-20 | Disposition: A | Discharge status: Home / Self Care | Payer: No Typology Code available for payment source | Source: Ambulatory Visit | Attending: Family Medicine | Admitting: Family Medicine

## 2022-11-20 VITALS — BP 110/60 | HR 87 | Temp 98.4°F | Ht 67.0 in | Wt 231.4 lb

## 2022-11-20 DIAGNOSIS — M7501 Adhesive capsulitis of right shoulder: Secondary | ICD-10-CM | POA: Diagnosis not present

## 2022-11-20 DIAGNOSIS — G8929 Other chronic pain: Secondary | ICD-10-CM

## 2022-11-20 DIAGNOSIS — M25511 Pain in right shoulder: Secondary | ICD-10-CM

## 2022-11-20 DIAGNOSIS — M19011 Primary osteoarthritis, right shoulder: Secondary | ICD-10-CM

## 2022-11-20 DIAGNOSIS — M19012 Primary osteoarthritis, left shoulder: Secondary | ICD-10-CM

## 2022-11-23 DIAGNOSIS — M47816 Spondylosis without myelopathy or radiculopathy, lumbar region: Secondary | ICD-10-CM | POA: Diagnosis not present

## 2022-11-23 DIAGNOSIS — G894 Chronic pain syndrome: Secondary | ICD-10-CM | POA: Diagnosis not present

## 2022-11-24 DIAGNOSIS — E785 Hyperlipidemia, unspecified: Secondary | ICD-10-CM | POA: Diagnosis not present

## 2022-11-24 DIAGNOSIS — Z008 Encounter for other general examination: Secondary | ICD-10-CM | POA: Diagnosis not present

## 2022-11-24 DIAGNOSIS — E1169 Type 2 diabetes mellitus with other specified complication: Secondary | ICD-10-CM | POA: Diagnosis not present

## 2022-11-24 DIAGNOSIS — E669 Obesity, unspecified: Secondary | ICD-10-CM | POA: Diagnosis not present

## 2022-11-24 DIAGNOSIS — Z6833 Body mass index (BMI) 33.0-33.9, adult: Secondary | ICD-10-CM | POA: Diagnosis not present

## 2022-11-24 DIAGNOSIS — E041 Nontoxic single thyroid nodule: Secondary | ICD-10-CM | POA: Diagnosis not present

## 2022-11-24 DIAGNOSIS — E1122 Type 2 diabetes mellitus with diabetic chronic kidney disease: Secondary | ICD-10-CM | POA: Diagnosis not present

## 2022-11-24 DIAGNOSIS — N1832 Chronic kidney disease, stage 3b: Secondary | ICD-10-CM | POA: Diagnosis not present

## 2022-11-24 DIAGNOSIS — I129 Hypertensive chronic kidney disease with stage 1 through stage 4 chronic kidney disease, or unspecified chronic kidney disease: Secondary | ICD-10-CM | POA: Diagnosis not present

## 2022-12-04 DIAGNOSIS — M47816 Spondylosis without myelopathy or radiculopathy, lumbar region: Secondary | ICD-10-CM | POA: Diagnosis not present

## 2022-12-14 DIAGNOSIS — M4804 Spinal stenosis, thoracic region: Secondary | ICD-10-CM | POA: Diagnosis not present

## 2022-12-14 DIAGNOSIS — M4316 Spondylolisthesis, lumbar region: Secondary | ICD-10-CM | POA: Diagnosis not present

## 2022-12-15 ENCOUNTER — Other Ambulatory Visit: Payer: Self-pay | Admitting: Family Medicine

## 2022-12-16 ENCOUNTER — Other Ambulatory Visit: Payer: Self-pay | Admitting: Family Medicine

## 2022-12-18 NOTE — Telephone Encounter (Signed)
Last office visit 11/20/2022 with Dr. Patsy Lager for Right Shoulder.  Last refilled 11/14/2022 for #30 with no refills.  Next Appt: 01/22/2023 with Dr. Patsy Lager for frozen shoulder.

## 2022-12-18 NOTE — Telephone Encounter (Signed)
Duplicate Request.

## 2023-01-08 DIAGNOSIS — M170 Bilateral primary osteoarthritis of knee: Secondary | ICD-10-CM | POA: Diagnosis not present

## 2023-01-09 DIAGNOSIS — M47816 Spondylosis without myelopathy or radiculopathy, lumbar region: Secondary | ICD-10-CM | POA: Diagnosis not present

## 2023-01-09 DIAGNOSIS — M4314 Spondylolisthesis, thoracic region: Secondary | ICD-10-CM | POA: Diagnosis not present

## 2023-01-09 DIAGNOSIS — M5134 Other intervertebral disc degeneration, thoracic region: Secondary | ICD-10-CM | POA: Diagnosis not present

## 2023-01-09 DIAGNOSIS — M48061 Spinal stenosis, lumbar region without neurogenic claudication: Secondary | ICD-10-CM | POA: Diagnosis not present

## 2023-01-09 DIAGNOSIS — M4316 Spondylolisthesis, lumbar region: Secondary | ICD-10-CM | POA: Diagnosis not present

## 2023-01-09 DIAGNOSIS — M5125 Other intervertebral disc displacement, thoracolumbar region: Secondary | ICD-10-CM | POA: Diagnosis not present

## 2023-01-09 DIAGNOSIS — M4804 Spinal stenosis, thoracic region: Secondary | ICD-10-CM | POA: Diagnosis not present

## 2023-01-09 DIAGNOSIS — M4807 Spinal stenosis, lumbosacral region: Secondary | ICD-10-CM | POA: Diagnosis not present

## 2023-01-09 DIAGNOSIS — M47817 Spondylosis without myelopathy or radiculopathy, lumbosacral region: Secondary | ICD-10-CM | POA: Diagnosis not present

## 2023-01-21 NOTE — Progress Notes (Unsigned)
    Margaret Nordhoff T. Currie Dennin, MD, CAQ Sports Medicine [facility] at [facility] [address] [place], [address]  Phone: [phone]  FAX: [phone]  Margaret Gordon - 82 y.o. female  MRN [id number]  Date of Birth: 04-07-1940  Date: 01/22/2023  PCP: Excell Seltzer, MD  Referral: Excell Seltzer, MD  No chief complaint on file.  Subjective:   Margaret Gordon is a 82 y.o. very pleasant female patient with There is no height or weight on file to calculate BMI. who presents with the following:  She is a very pleasant lady who I have seen in the past, most recently for adhesive capsulitis in September 2024.  On review of her shoulder x-ray, she does have advanced AC joint arthropathy with significant calcification on the undersurface of the acromion.  She also appears to have a somewhat high riding humeral head.  Last time I saw her, I had her begin some range of motion program.    Review of Systems is noted in the HPI, as appropriate  Objective:   There were no vitals taken for this visit.  GEN: No acute distress; alert,appropriate. PULM: Breathing comfortably in no respiratory distress PSYCH: Normally interactive.   Laboratory and Imaging Data:  Assessment and Plan:   ***

## 2023-01-22 ENCOUNTER — Ambulatory Visit (INDEPENDENT_AMBULATORY_CARE_PROVIDER_SITE_OTHER): Payer: No Typology Code available for payment source | Admitting: Family Medicine

## 2023-01-22 ENCOUNTER — Encounter: Payer: Self-pay | Admitting: Family Medicine

## 2023-01-22 VITALS — BP 110/70 | HR 80 | Temp 98.3°F | Ht 67.0 in | Wt 225.5 lb

## 2023-01-22 DIAGNOSIS — G8929 Other chronic pain: Secondary | ICD-10-CM | POA: Diagnosis not present

## 2023-01-22 DIAGNOSIS — M25511 Pain in right shoulder: Secondary | ICD-10-CM | POA: Diagnosis not present

## 2023-01-22 DIAGNOSIS — M7501 Adhesive capsulitis of right shoulder: Secondary | ICD-10-CM

## 2023-01-22 MED ORDER — TRIAMCINOLONE ACETONIDE 40 MG/ML IJ SUSP
40.0000 mg | Freq: Once | INTRAMUSCULAR | Status: AC
Start: 2023-01-22 — End: 2023-01-22
  Administered 2023-01-22: 40 mg via INTRA_ARTICULAR

## 2023-01-30 ENCOUNTER — Other Ambulatory Visit: Payer: Self-pay | Admitting: Family Medicine

## 2023-01-31 NOTE — Telephone Encounter (Signed)
Last office visit 01/22/23 with Dr. Patsy Lager for shoulder pain.  Last refilled 11/23/2022

## 2023-02-05 ENCOUNTER — Encounter: Payer: Self-pay | Admitting: Family Medicine

## 2023-02-05 DIAGNOSIS — L669 Cicatricial alopecia, unspecified: Secondary | ICD-10-CM | POA: Diagnosis not present

## 2023-02-06 ENCOUNTER — Other Ambulatory Visit: Payer: Self-pay | Admitting: Family Medicine

## 2023-02-06 DIAGNOSIS — E7800 Pure hypercholesterolemia, unspecified: Secondary | ICD-10-CM

## 2023-02-06 DIAGNOSIS — M899 Disorder of bone, unspecified: Secondary | ICD-10-CM | POA: Diagnosis not present

## 2023-02-06 DIAGNOSIS — E559 Vitamin D deficiency, unspecified: Secondary | ICD-10-CM

## 2023-02-06 DIAGNOSIS — E041 Nontoxic single thyroid nodule: Secondary | ICD-10-CM

## 2023-02-06 DIAGNOSIS — Z862 Personal history of diseases of the blood and blood-forming organs and certain disorders involving the immune mechanism: Secondary | ICD-10-CM

## 2023-02-06 DIAGNOSIS — N281 Cyst of kidney, acquired: Secondary | ICD-10-CM | POA: Diagnosis not present

## 2023-02-06 NOTE — Addendum Note (Signed)
Addended by: Alvina Chou on: 02/06/2023 11:18 AM   Modules accepted: Orders

## 2023-02-08 ENCOUNTER — Other Ambulatory Visit (HOSPITAL_COMMUNITY): Payer: Self-pay | Admitting: Neurological Surgery

## 2023-02-08 DIAGNOSIS — M899 Disorder of bone, unspecified: Secondary | ICD-10-CM

## 2023-02-08 DIAGNOSIS — N289 Disorder of kidney and ureter, unspecified: Secondary | ICD-10-CM

## 2023-02-09 ENCOUNTER — Ambulatory Visit: Payer: No Typology Code available for payment source | Admitting: Physical Therapy

## 2023-02-09 ENCOUNTER — Encounter: Payer: Self-pay | Admitting: Physical Therapy

## 2023-02-09 ENCOUNTER — Other Ambulatory Visit: Payer: Self-pay

## 2023-02-09 DIAGNOSIS — M25511 Pain in right shoulder: Secondary | ICD-10-CM | POA: Diagnosis not present

## 2023-02-09 DIAGNOSIS — M25611 Stiffness of right shoulder, not elsewhere classified: Secondary | ICD-10-CM | POA: Diagnosis not present

## 2023-02-09 DIAGNOSIS — G8929 Other chronic pain: Secondary | ICD-10-CM | POA: Diagnosis not present

## 2023-02-09 DIAGNOSIS — R293 Abnormal posture: Secondary | ICD-10-CM

## 2023-02-09 DIAGNOSIS — M6281 Muscle weakness (generalized): Secondary | ICD-10-CM

## 2023-02-09 NOTE — Therapy (Signed)
OUTPATIENT PHYSICAL THERAPY SHOULDER EVALUATION   Patient Name: Margaret Gordon MRN: [id number] DOB:03-Jun-1940, 82 y.o., female Today's Date: 02/09/2023  END OF SESSION:  PT End of Session - 02/09/23 1039     Visit Number 1    Number of Visits 17    Date for PT Re-Evaluation 04/06/23    Authorization Type Devoted    Authorization Time Period 02/09/23 to 04/06/23    PT Start Time 1018    PT Stop Time 1059    PT Time Calculation (min) 41 min    Activity Tolerance Patient tolerated treatment well    Behavior During Therapy WFL for tasks assessed/performed             Past Medical History:  Diagnosis Date   Anemia    hx   Arthritis    Asthma    GERD (gastroesophageal reflux disease)    History of chicken pox    History of glaucoma    History of hiatal hernia    Hyperlipidemia    Hypertension    Migraines    Pneumonia    hx   Scoliosis    Past Surgical History:  Procedure Laterality Date   LUMBAR LAMINECTOMY/DECOMPRESSION MICRODISCECTOMY Left 11/02/2016   Procedure: Left Lumbar Three-Four, Lumbar Four-Five Laminectomy and Foraminotomy;  Surgeon: Tia Alert, MD;  Location: [facility] OR;  Service: Neurosurgery;  Laterality: Left;   TONSILLECTOMY     VESICOVAGINAL FISTULA CLOSURE W/ TAH     Patient Active Problem List   Diagnosis Date Noted   Blood creatinine decreased compared with prior measurement 11/03/2022   Chronic right shoulder pain 11/03/2022   Periorbital cellulitis of right eye 06/28/2022   Peripheral edema 06/06/2022   Venous insufficiency 06/06/2022   History of vertigo 02/08/2022   Benign paroxysmal positional vertigo due to bilateral vestibular disorder 01/05/2022   Trochanteric bursitis of right hip 01/05/2022   Decreased GFR 05/10/2021   History of colonic polyps 04/07/2021   Thyroid nodule 04/07/2021   GERD (gastroesophageal reflux disease) 04/07/2021   Chronic bilateral low back pain with right-sided sciatica 04/07/2021   Scoliosis 04/07/2021    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   Osteoarthritis, 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   Hypertension 04/07/2021   High cholesterol 04/07/2021   History of anemia 04/07/2021   Body mass index (BMI) of 33.0 to 33.9 in adult 04/07/2021   Vitamin D deficiency 04/07/2021   OA (osteoarthritis of spine) 01/16/2017   S/P lumbar laminectomy 11/02/2016   Abnormal CT scan, chest 04/01/2013   Upper airway cough syndrome 01/05/2013    PCP: Kerby Nora MD   REFERRING PROVIDER: Hannah Beat, MD  REFERRING DIAG: Diagnosis M25.511,G89.29 (ICD-10-CM) - Chronic right shoulder pain M75.01 (ICD-10-CM) - Adhesive capsulitis of right shoulder  THERAPY DIAG:  Stiffness of right shoulder, not elsewhere classified  Chronic right shoulder pain  Muscle weakness (generalized)  Abnormal posture  Rationale for Evaluation and Treatment: Rehabilitation  ONSET DATE:  "quite awhile"/chronic   SUBJECTIVE:  SUBJECTIVE STATEMENT:  The shoulder has been an issue for awhile, I went to my PCP and then an ortho, he gave me some exercises but hadn't been doing them. Have had a lot going on around this shoulder, husband had TKR surgery recently. Shoulder is just aggravating, I can lift it more now, before I couldn't do that at all. Now I can lift it over my head but its aggravating. Sometimes I need help dressing, hand/wrist feel like they're getting weak. Dr. Patsy Lager gave me a shot in it last time which really helped (2-3 weeks ago).   Hand dominance: Right  PERTINENT HISTORY: See above   PAIN:  Are you having pain? Yes: NPRS scale: 4 now, can still get to 9 at worst/10 Pain location: R shoulder  Pain description: aggravating, nagging  Aggravating factors: reaching with RUE  Relieving factors: shot from MD  PRECAUTIONS: None  RED  FLAGS: None   WEIGHT BEARING RESTRICTIONS: No  FALLS:  Has patient fallen in last 6 months? 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  DIAGNOSTIC FINDINGS:   CLINICAL DATA:  Decreased range of motion, initial encounter   EXAM: RIGHT SHOULDER - 2+ VIEW   COMPARISON:  None Available.   FINDINGS: Degenerative changes of the acromioclavicular joint are seen. No acute fracture or dislocation is noted. Humeral head is high-riding which may be related to a chronic rotator cuff injury. The underlying bony thorax is within normal limits.   IMPRESSION: Degenerative change and findings suspicious for rotator cuff injury.      PATIENT SURVEYS:  FOTO 40, predicted 58 in 13 visits   COGNITION: Overall cognitive status: Within functional limits for tasks assessed     SENSATION: Not tested  POSTURE:  Forward head, rounded shoulders, increased thoracic kyphosis   UPPER EXTREMITY ROM:   Active ROM Right Eval  Available ROM Left eval  Shoulder flexion 3+  4+  Shoulder extension    Shoulder abduction 3- 4+  Shoulder adduction    Shoulder internal rotation 5 4+  Shoulder external rotation 3 at best  4  Elbow flexion    Elbow extension    Wrist flexion    Wrist extension    Wrist ulnar deviation    Wrist radial deviation    Wrist pronation    Wrist supination    (Blank rows = not tested)  UPPER EXTREMITY MMT:  MMT  Right eval Left eval  Shoulder flexion 100* UT compensations  140*  Shoulder extension 95* UT compensations  110*  Shoulder abduction    Shoulder adduction    Shoulder internal rotation FIR L4 FIR L4   Shoulder external rotation FER C6 FER suboccipitals   Middle trapezius    Lower trapezius    Elbow flexion    Elbow extension    Wrist flexion    Wrist extension    Wrist ulnar deviation    Wrist radial deviation    Wrist pronation    Wrist supination  Grip strength (lbs)    (Blank rows = not tested)      PALPATION:   R anterior/middle/posterior delts and upper traps very tender to touch   TODAY'S TREATMENT:                                                                                                                                         DATE:   02/09/23- exam findings, POC, HEP   UBE L2x3 min forward/3 min backwards for w/u and tissue perfusion Scap retractions 10x3 seconds Backwards shoulder rolls x20  Shoulder flexion stretch on door 10x5 seconds     PATIENT EDUCATION: Education details: exam findings, POC, HEP  Person educated: Patient Education method: Programmer, multimedia, Demonstration, and Handouts Education comprehension: verbalized understanding, returned demonstration, and needs further education  HOME EXERCISE PROGRAM: Access Code: [email or web address] URL: [email or web address] Date: 02/09/2023 Prepared by: Nedra Hai  Exercises - Seated Scapular Retraction  - 1 x daily - 7 x weekly - 2 sets - 10 reps - 3 seconds  hold - Standing Backward Shoulder Rolls  - 1 x daily - 7 x weekly - 2 sets - 15-20 reps - Standing Single Arm Shoulder Flexion Stretch on Wall  - 2 x daily - 7 x weekly - 1 sets - 10 reps - 5 seconds  hold - Seated Shoulder Abduction Towel Slide at Table Top with Forearm in Neutral  - 2 x daily - 7 x weekly - 1 sets - 10 reps - 5 seconds  hold  ASSESSMENT:  CLINICAL IMPRESSION: Patient is a 82 y.o. F who was seen today for physical  therapy evaluation and treatment for Diagnosis M25.511,G89.29 (ICD-10-CM) - Chronic right shoulder pain M75.01 (ICD-10-CM) - Adhesive capsulitis of right shoulder. Exam is as expected with no complicating factors. Will benefit from skilled PT services to address all functional and subjective/objective concerns.   OBJECTIVE IMPAIRMENTS: decreased activity tolerance, decreased ROM, decreased strength, hypomobility, increased fascial restrictions, increased muscle spasms, impaired UE functional use, postural dysfunction, obesity, and pain.   ACTIVITY LIMITATIONS: carrying, lifting, bathing, toileting, dressing, reach over head, and caring for others  PARTICIPATION LIMITATIONS: meal prep, cleaning, laundry, driving, shopping, community activity, yard work, and church  PERSONAL FACTORS: Age, Behavior pattern, Fitness, Past/current experiences, Social background, and Time since onset of injury/illness/exacerbation are also affecting patient's functional outcome.   REHAB POTENTIAL: Fair chronicity of impairments   CLINICAL DECISION MAKING: Stable/uncomplicated  EVALUATION COMPLEXITY: Low   GOALS: Goals reviewed with patient? No  SHORT TERM GOALS: Target date: 03/09/2023    Will be compliant with appropriate progressive HEP  Baseline: Goal status: INITIAL  2.  R shoulder flexion and ABD AROM to be equal to that of left  Baseline:  Goal status: INITIAL  3.  R shoulder FER AROM to be equal to that of the left  Baseline:  Goal status: INITIAL  4.  Will demonstrate improved postural  awareness and biomechanics to facilitate good GH and scapular mechanics  Baseline:  Goal status: INITIAL    LONG TERM GOALS: Target date: 04/06/2023    MMT to have improved by at least 1 grade in all weak groups  Baseline:  Goal status: INITIAL  2.  Pain R shoulder to be no more than 4/10 at worst  Baseline:  Goal status: INITIAL  3.  Will be able to perform all self and home care tasks without  difficulty or increased pain R UE  Baseline:  Goal status: INITIAL  4.  Will be able to reach overhead without difficulty or increased pain R UE  Baseline:  Goal status: INITIAL  5.  FOTO score to be within 10 points of predicted by time of DC  Baseline:  Goal status: INITIAL    PLAN:  PT FREQUENCY: 1-2x/week  PT DURATION: 8 weeks  PLANNED INTERVENTIONS: 97164- PT Re-evaluation, 97110-Therapeutic exercises, 97530- Therapeutic activity, [id number]- Neuromuscular re-education, 97535- Self Care, 45409- Manual therapy, [id number]- Aquatic Therapy, 97014- Electrical stimulation (unattended), 97016- Vasopneumatic device, [id number]- Ionotophoresis 4mg /ml Dexamethasone, Taping, Dry Needling, Cryotherapy, and Moist heat  PLAN FOR NEXT SESSION: work on ROM and postural/periscapular strengthening, manual and dry needling as appropriate   Nedra Hai, PT, DPT 02/09/23 11:09 AM

## 2023-02-13 ENCOUNTER — Encounter: Payer: Self-pay | Admitting: Family Medicine

## 2023-02-13 ENCOUNTER — Ambulatory Visit (INDEPENDENT_AMBULATORY_CARE_PROVIDER_SITE_OTHER): Payer: No Typology Code available for payment source | Admitting: Family Medicine

## 2023-02-13 VITALS — BP 100/60 | HR 89 | Temp 98.2°F | Ht 66.75 in | Wt 231.2 lb

## 2023-02-13 DIAGNOSIS — Z862 Personal history of diseases of the blood and blood-forming organs and certain disorders involving the immune mechanism: Secondary | ICD-10-CM | POA: Diagnosis not present

## 2023-02-13 DIAGNOSIS — E7800 Pure hypercholesterolemia, unspecified: Secondary | ICD-10-CM

## 2023-02-13 DIAGNOSIS — Z0000 Encounter for general adult medical examination without abnormal findings: Secondary | ICD-10-CM | POA: Diagnosis not present

## 2023-02-13 DIAGNOSIS — E559 Vitamin D deficiency, unspecified: Secondary | ICD-10-CM | POA: Diagnosis not present

## 2023-02-13 DIAGNOSIS — Z833 Family history of diabetes mellitus: Secondary | ICD-10-CM | POA: Diagnosis not present

## 2023-02-13 DIAGNOSIS — I10 Essential (primary) hypertension: Secondary | ICD-10-CM

## 2023-02-13 DIAGNOSIS — N281 Cyst of kidney, acquired: Secondary | ICD-10-CM | POA: Insufficient documentation

## 2023-02-13 LAB — COMPREHENSIVE METABOLIC PANEL
ALT: 8 U/L (ref 0–35)
AST: 17 U/L (ref 0–37)
Albumin: 3.8 g/dL (ref 3.5–5.2)
Alkaline Phosphatase: 118 U/L — ABNORMAL HIGH (ref 39–117)
BUN: 20 mg/dL (ref 6–23)
CO2: 36 meq/L — ABNORMAL HIGH (ref 19–32)
Calcium: 9.3 mg/dL (ref 8.4–10.5)
Chloride: 102 meq/L (ref 96–112)
Creatinine, Ser: 1.19 mg/dL (ref 0.40–1.20)
GFR: 43.17 mL/min — ABNORMAL LOW (ref >60.00)
Glucose, Bld: 97 mg/dL (ref 70–99)
Potassium: 4.1 meq/L (ref 3.5–5.1)
Sodium: 144 meq/L (ref 135–145)
Total Bilirubin: 0.7 mg/dL (ref 0.2–1.2)
Total Protein: 6.8 g/dL (ref 6.0–8.3)

## 2023-02-13 LAB — CBC WITH DIFFERENTIAL/PLATELET
Basophils Absolute: 0 K/uL (ref 0.0–0.1)
Basophils Relative: 0.4 % (ref 0.0–3.0)
Eosinophils Absolute: 0.1 K/uL (ref 0.0–0.7)
Eosinophils Relative: 1.3 % (ref 0.0–5.0)
HCT: 37.1 % (ref 36.0–46.0)
Hemoglobin: 11.5 g/dL — ABNORMAL LOW (ref 12.0–15.0)
Lymphocytes Relative: 20.5 % (ref 12.0–46.0)
Lymphs Abs: 1.7 K/uL (ref 0.7–4.0)
MCHC: 30.9 g/dL (ref 30.0–36.0)
MCV: 73.4 fl — ABNORMAL LOW (ref 78.0–100.0)
Monocytes Absolute: 0.6 K/uL (ref 0.1–1.0)
Monocytes Relative: 7.2 % (ref 3.0–12.0)
Neutro Abs: 5.7 K/uL (ref 1.4–7.7)
Neutrophils Relative %: 70.6 % (ref 43.0–77.0)
Platelets: 179 K/uL (ref 150.0–400.0)
RBC: 5.06 Mil/uL (ref 3.87–5.11)
RDW: 15.2 % (ref 11.5–15.5)
WBC: 8.1 K/uL (ref 4.0–10.5)

## 2023-02-13 LAB — LIPID PANEL
Cholesterol: 172 mg/dL (ref 0–200)
HDL: 66.8 mg/dL (ref >39.00)
LDL Cholesterol: 90 mg/dL (ref 0–99)
NonHDL: 104.71
Total CHOL/HDL Ratio: 3
Triglycerides: 73 mg/dL (ref 0.0–149.0)
VLDL: 14.6 mg/dL (ref 0.0–40.0)

## 2023-02-13 LAB — HEMOGLOBIN A1C: Hgb A1c MFr Bld: 6.3 % (ref 4.6–6.5)

## 2023-02-13 LAB — IBC + FERRITIN
Ferritin: 341.9 ng/mL — ABNORMAL HIGH (ref 10.0–291.0)
Iron: 49 ug/dL (ref 42–145)
Saturation Ratios: 17.2 % — ABNORMAL LOW (ref 20.0–50.0)
TIBC: 285.6 ug/dL (ref 250.0–450.0)
Transferrin: 204 mg/dL — ABNORMAL LOW (ref 212.0–360.0)

## 2023-02-13 LAB — VITAMIN D 25 HYDROXY (VIT D DEFICIENCY, FRACTURES): VITD: 46.89 ng/mL (ref 30.00–100.00)

## 2023-02-13 NOTE — Assessment & Plan Note (Signed)
Stable, chronic.  Continue current medication.  Amlodipine 5 mg daily and telmisartan hydrochlorothiazide 40/12.5 mg daily.  She also uses Lasix 20 mg p.o. daily as needed

## 2023-02-13 NOTE — Patient Instructions (Signed)
Please stop at the lab to have labs drawn.  

## 2023-02-13 NOTE — Assessment & Plan Note (Signed)
Due for re-eval. 

## 2023-02-13 NOTE — Progress Notes (Signed)
Patient ID: Margaret Gordon, female    DOB: 02/18/1941, 82 y.o.   MRN: [id number]  This visit was conducted in person.  BP 100/60 (BP Location: Left Arm, Patient Position: Sitting, Cuff Size: Large)   Pulse 89   Temp 98.2 F (36.8 C) (Temporal)   Ht 5' 6.75" (1.695 m)   Wt 231 lb 4 oz (104.9 kg)   SpO2 97%   BMI 36.49 kg/m    CC  Chief Complaint  Patient presents with   Annual Exam    Part 2 (MWV 10/20/2022)     Subjective:   HPI: Margaret Gordon is a 82 y.o. female presenting on 02/13/2023 for   annual part 2  The patient presents for  complete physical and review of chronic health problems. He/She also has the following acute concerns today:  hair loss and back pain chronic... in eval by other MDs  Dr. Yetta Barre.. Recent MRI spine... 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  Planning CT thoracic spine and CT abdomen to evaluate further.  The patient saw a LPN or RN for medicare wellness visit. 10/20/2022  Prevention and wellness was reviewed in detail. Note reviewed and important notes copied below.   Vit D def: Currently taking vitamin D 1000 units daily.. due for re-eval  History of anemia.Marland Kitchen due for re-eval.  Seeing Dr.  Renaldo Harrison dermatologist for hair loss.  Hypertension:   Well controlled on  amlodipine 5 mg p.o. daily and telmisartan hydrochlorothiazide 40/12.5 mg daily. She uses Lasix 20 mg daily as well. BP Readings from Last 3 Encounters:  02/13/23 100/60  01/22/23 110/70  11/20/22 110/60  Using medication without problems or lightheadedness: none Chest pain with exertion:one Edema:  stable, wearing compression hose Short of breath:none Average home BPs: Other issues:  Elevated Cholesterol:  Due for re-eval On atorvastatin 10 mg p.o. daily Lab Results  Component Value Date   CHOL 158 02/08/2022   HDL 64.80 02/08/2022   LDLCALC 82 02/08/2022   TRIG 57.0 02/08/2022   CHOLHDL 2 02/08/2022  Using medications without  problems: Muscle aches:  Exercise: 3 days a week. Diet:  moderate  Relevant past medical, surgical, family and social history reviewed and updated as indicated. Interim medical history since our last visit reviewed. Allergies and medications reviewed and updated. Outpatient Medications Prior to Visit  Medication Sig Dispense Refill   amLODipine (NORVASC) 5 MG tablet TAKE 1 TABLET (5 MG TOTAL) BY MOUTH DAILY. 90 tablet 1   atorvastatin (LIPITOR) 10 MG tablet Take 1 tablet (10 mg total) by mouth daily. 90 tablet 3   doxycycline (MONODOX) 100 MG capsule Take 100 mg by mouth daily.     Fluocinolone Acetonide Scalp 0.01 % OIL APPLY TO SCALP 4 HOURS OR OVERNIGHT PRIOR TO SHAMPOO, COVER WITH SHOWER CAP. REPEAT EVERY 10-14 DAYS     furosemide (LASIX) 20 MG tablet TAKE 1 TABLET BY MOUTH EVERY DAY 90 tablet 1   gabapentin (NEURONTIN) 300 MG capsule Take 300 mg by mouth 2 (two) times daily.     ketoconazole (NIZORAL) 2 % shampoo Apply topically.     meclizine (ANTIVERT) 25 MG tablet TAKE 1 TABLET (25 MG TOTAL) BY MOUTH DAILY AS NEEDED FOR DIZZINESS. 30 tablet 5   Multiple Vitamin (MULTIVITAMIN WITH MINERALS) TABS tablet Take 1 tablet by mouth at bedtime.     pantoprazole (PROTONIX) 40 MG tablet TAKE 1 TABLET BY MOUTH EVERY DAY 90 tablet 1   telmisartan-hydrochlorothiazide (MICARDIS HCT)  40-12.5 MG tablet TAKE 1 TABLET BY MOUTH EVERY DAY 90 tablet 1   traMADol (ULTRAM) 50 MG tablet TAKE 1 TABLET BY MOUTH EVERY 6 HOURS AS NEEDED. 30 tablet 0   triamcinolone cream (KENALOG) 0.1 % SMARTSIG:sparingly Topical Twice Daily     No facility-administered medications prior to visit.     Per HPI unless specifically indicated in ROS section below Review of Systems  Constitutional:  Negative for fatigue and fever.  HENT:  Negative for congestion.   Eyes:  Negative for pain.  Respiratory:  Negative for cough and shortness of breath.   Cardiovascular:  Negative for chest pain, palpitations and leg swelling.   Gastrointestinal:  Negative for abdominal pain.  Genitourinary:  Negative for dysuria and vaginal bleeding.  Musculoskeletal:  Negative for back pain.  Neurological:  Negative for syncope, light-headedness and headaches.  Psychiatric/Behavioral:  Negative for dysphoric mood.    Objective:  BP 100/60 (BP Location: Left Arm, Patient Position: Sitting, Cuff Size: Large)   Pulse 89   Temp 98.2 F (36.8 C) (Temporal)   Ht 5' 6.75" (1.695 m)   Wt 231 lb 4 oz (104.9 kg)   SpO2 97%   BMI 36.49 kg/m   Wt Readings from Last 3 Encounters:  02/13/23 231 lb 4 oz (104.9 kg)  01/22/23 225 lb 8 oz (102.3 kg)  11/20/22 231 lb 6 oz (105 kg)      Physical Exam Constitutional:      General: She is not in acute distress.    Appearance: Normal appearance. She is well-developed. She is not ill-appearing or toxic-appearing.  HENT:     Head: Normocephalic.     Right Ear: Hearing, tympanic membrane, ear canal and external ear normal. Tympanic membrane is not erythematous, retracted or bulging.     Left Ear: Hearing, tympanic membrane, ear canal and external ear normal. Tympanic membrane is not erythematous, retracted or bulging.     Nose: No mucosal edema or rhinorrhea.     Right Sinus: No maxillary sinus tenderness or frontal sinus tenderness.     Left Sinus: No maxillary sinus tenderness or frontal sinus tenderness.     Mouth/Throat:     Pharynx: Uvula midline.  Eyes:     General: Lids are normal. Lids are everted, no foreign bodies appreciated.     Conjunctiva/sclera: Conjunctivae normal.     Pupils: Pupils are equal, round, and reactive to light.  Neck:     Thyroid: No thyroid mass or thyromegaly.     Vascular: No carotid bruit.     Trachea: Trachea normal.  Cardiovascular:     Rate and Rhythm: Normal rate and regular rhythm.     Pulses: Normal pulses.     Heart sounds: Normal heart sounds, S1 normal and S2 normal. No murmur heard.    No friction rub. No gallop.  Pulmonary:     Effort:  Pulmonary effort is normal. No tachypnea or respiratory distress.     Breath sounds: Normal breath sounds. No decreased breath sounds, wheezing, rhonchi or rales.  Abdominal:     General: Bowel sounds are normal.     Palpations: Abdomen is soft.     Tenderness: There is no abdominal tenderness.  Musculoskeletal:     Cervical back: Normal range of motion and neck supple.  Skin:    General: Skin is warm and dry.     Findings: No rash.  Neurological:     Mental Status: She is alert.  Psychiatric:  Mood and Affect: Mood is not anxious or depressed.        Speech: Speech normal.        Behavior: Behavior normal. Behavior is cooperative.        Thought Content: Thought content normal.        Judgment: Judgment normal.       Results for orders placed or performed in visit on 11/08/22  TSH  Result Value Ref Range   TSH 0.93 0.35 - 5.50 uIU/mL  Basic metabolic panel  Result Value Ref Range   Sodium 144 135 - 145 mEq/L   Potassium 3.8 3.5 - 5.1 mEq/L   Chloride 102 96 - 112 mEq/L   CO2 35 (H) 19 - 32 mEq/L   Glucose, Bld 105 (H) 70 - 99 mg/dL   BUN 18 6 - 23 mg/dL   Creatinine, Ser 1.61 (H) 0.40 - 1.20 mg/dL   GFR 09.60 (L) >45.40 mL/min   Calcium 9.6 8.4 - 10.5 mg/dL     COVID 19 screen:  No recent travel or known exposure to COVID19 The patient denies respiratory symptoms of COVID 19 at this time. The importance of social distancing was discussed today.   Assessment and Plan   The patient's preventative maintenance and recommended screening tests for an annual wellness exam were reviewed in full today. Brought up to date unless services declined.  Counselled on the importance of diet, exercise, and its role in overall health and mortality. The patient's FH and SH was reviewed, including their home life, tobacco status, and drug and alcohol status.   Vaccines: Consider Shingrix. Uptodate with Td, flu, PNA and COVID x 5 Pap/DVE: not indicated Mammo: 03/2022:  Bone  Density: 01/07/2020 normal, repeat in 5 years Colon: Not indicated Smoking Status: Former smoker.. very remote ETOH/ drug use:  occ/none  Hep C: Done    Problem List Items Addressed This Visit     High cholesterol    Due for re-eval.       Hypertension    Stable, chronic.  Continue current medication.  Amlodipine 5 mg daily and telmisartan hydrochlorothiazide 40/12.5 mg daily.  She also uses Lasix 20 mg p.o. daily as needed      Renal cyst    Seen incidentally on MRI per Dr. Yetta Barre.  Moving forward with CT abdomen to evaluate further.  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        Other Visit Diagnoses     Routine general medical examination at a health care facility    -  Primary   Family history of diabetes mellitus       Relevant Orders   HgB A1c         Kerby Nora, MD

## 2023-02-13 NOTE — Assessment & Plan Note (Signed)
Seen incidentally on MRI per Dr. Yetta Barre.  Moving forward with CT abdomen to evaluate further.  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

## 2023-02-15 NOTE — Therapy (Signed)
OUTPATIENT PHYSICAL THERAPY TREATMENT   Patient Name: Margaret Gordon MRN: [id number] DOB:1940-10-19, 82 y.o., female Today's Date: 02/16/2023  END OF SESSION:  PT End of Session - 02/16/23 0810     Visit Number 2    Number of Visits 17    Date for PT Re-Evaluation 04/06/23    Authorization Type Devoted $15    Authorization Time Period 02/09/23 to 04/06/23    PT Start Time 0805    PT Stop Time 0844    PT Time Calculation (min) 39 min    Activity Tolerance Patient limited by pain    Behavior During Therapy [facility] for tasks assessed/performed              Past Medical History:  Diagnosis Date   Anemia    hx   Arthritis    Asthma    GERD (gastroesophageal reflux disease)    History of chicken pox    History of glaucoma    History of hiatal hernia    Hyperlipidemia    Hypertension    Migraines    Pneumonia    hx   Scoliosis    Past Surgical History:  Procedure Laterality Date   LUMBAR LAMINECTOMY/DECOMPRESSION MICRODISCECTOMY Left 11/02/2016   Procedure: Left Lumbar Three-Four, Lumbar Four-Five Laminectomy and Foraminotomy;  Surgeon: Tia Alert, MD;  Location: [facility] OR;  Service: Neurosurgery;  Laterality: Left;   TONSILLECTOMY     VESICOVAGINAL FISTULA CLOSURE W/ TAH     Patient Active Problem List   Diagnosis Date Noted   Renal cyst 02/13/2023   Blood creatinine decreased compared with prior measurement 11/03/2022   Chronic right shoulder pain 11/03/2022   Periorbital cellulitis of right eye 06/28/2022   Peripheral edema 06/06/2022   Venous insufficiency 06/06/2022   History of vertigo 02/08/2022   Benign paroxysmal positional vertigo due to bilateral vestibular disorder 01/05/2022   Trochanteric bursitis of right hip 01/05/2022   Decreased GFR 05/10/2021   History of colonic polyps 04/07/2021   Thyroid nodule 04/07/2021   GERD (gastroesophageal reflux disease) 04/07/2021   Chronic bilateral low back pain with right-sided sciatica 04/07/2021    Scoliosis 04/07/2021   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   Osteoarthritis, 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   Hypertension 04/07/2021   High cholesterol 04/07/2021   History of anemia 04/07/2021   Body mass index (BMI) of 33.0 to 33.9 in adult 04/07/2021   Vitamin D deficiency 04/07/2021   OA (osteoarthritis of spine) 01/16/2017   S/P lumbar laminectomy 11/02/2016   Abnormal CT scan, chest 04/01/2013   Upper airway cough syndrome 01/05/2013    PCP: Kerby Nora MD   REFERRING PROVIDER: Hannah Beat, MD  REFERRING DIAG: Diagnosis M25.511,G89.29 (ICD-10-CM) - Chronic right shoulder pain 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  Pt indicated having pinching/tight in upper Rt arm.  Not able to reach like normal.  Symptoms some days constant, some days come and go.   Hand dominance: Right  PERTINENT HISTORY: See above   PAIN:  NPRS scale: upon arrival 8/10 Pain location: Rt shoulder  Pain description: aggravating, nagging  Aggravating factors: reaching with RUE  Relieving factors: shot from MD  PRECAUTIONS: None  RED FLAGS: None   WEIGHT BEARING RESTRICTIONS: No  FALLS:  Has patient fallen in last 6 months? 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  DIAGNOSTIC FINDINGS:  02/09/2023 review:   RIGHT SHOULDER - 2+ VIEW   IMPRESSION: Degenerative change and findings suspicious for rotator cuff injury.      PATIENT SURVEYS:  02/09/2023 FOTO 40, predicted 58 in 13 visits   COGNITION: 02/09/2023 Overall cognitive status: Within functional limits for tasks assessed     SENSATION: 02/09/2023 Not tested  POSTURE: 02/09/2023 Forward head, rounded shoulders, increased thoracic kyphosis   UPPER EXTREMITY MMT:   MMT Right 02/09/2023   Left 02/09/2023  Shoulder flexion 3+  4+  Shoulder extension    Shoulder abduction 3- 4+  Shoulder adduction    Shoulder internal rotation 5 4+  Shoulder external rotation 3 at best  4  Elbow flexion    Elbow extension    Wrist flexion    Wrist extension    Wrist ulnar deviation    Wrist radial deviation    Wrist pronation    Wrist supination    (Blank rows = not tested)  UPPER EXTREMITY ROM:  ROM Right 02/09/2023 AROM Left 02/09/2023 Right 02/16/2023  Shoulder flexion 100* UT compensations  140* 140 AROM in supine  Shoulder extension 95* UT compensations  110*   Shoulder abduction     Shoulder adduction     Shoulder internal rotation FIR L4 FIR L4    Shoulder external rotation FER C6 FER suboccipitals    Middle trapezius     Lower trapezius     Elbow flexion     Elbow extension     Wrist flexion     Wrist extension     Wrist ulnar deviation     Wrist radial deviation     Wrist pronation     Wrist supination     Grip strength (lbs)     (Blank rows = not tested)  PALPATION:   02/09/2023 R anterior/middle/posterior delts and upper traps very tender to touch                    TODAY'S TREATMENT:      DATE: 02/16/2023 Therex:  Supine Rt shoulder AROM flexion x 15 Sidelying Rt shoulder AROM abduction x 15 Sidelying Rt shoulder ER c towel under arm x 15  Standing green band rows 2 x 10  Standing green band gh ext 2 x 10   Manual  Rt shoulder g2 inferior  mobs in elevation, Mobilization c movement posterior glide with ER passively    TODAY'S TREATMENT:      DATE: 02/09/23 exam findings, POC, HEP   UBE L2x3 min forward/3 min backwards for w/u and tissue perfusion Scap retractions 10x3 seconds Backwards shoulder rolls x20  Shoulder flexion stretch on door 10x5 seconds     PATIENT EDUCATION: 02/16/2023 Education details: HEP progression  Person educated: Patient Education method: Programmer, multimedia, Facilities manager, and Handouts Education comprehension: verbalized understanding, returned demonstration, and needs further education  HOME EXERCISE PROGRAM: Access Code: [email or web address] URL: [email or web address] Date: 02/16/2023 Prepared by: Chyrel Masson  Exercises - Seated Scapular Retraction  - 1 x daily - 7 x weekly - 2 sets - 10 reps - 3 seconds  hold - Standing Single Arm Shoulder Flexion Stretch on Wall  - 2 x daily - 7 x weekly - 1 sets - 10 reps - 5 seconds  hold - Supine Shoulder Flexion Extension Full Range AROM  - 1-2 x daily - 7 x weekly - 1-2 sets - 10-15 reps - Sidelying Shoulder Abduction Palm Forward  - 1-2 x daily - 7 x weekly - 1-2 sets - 10-15 reps - Sidelying Shoulder External Rotation (Mirrored)  - 1-2 x daily - 7 x weekly - 1-2 sets - 10-15 reps  ASSESSMENT:  CLINICAL IMPRESSION: Assessment today showed minimal passive range restriction /pain.  Gravity reduced active range was tolerated fairly well today.   ER in sidelying was more trouble compared to other movements.  Progressed HEP to include active range activity. Continued  skilled PT services c focus on strengthening to help improve functional movements was indicated.   OBJECTIVE IMPAIRMENTS: decreased activity tolerance, decreased ROM, decreased strength, hypomobility, increased fascial restrictions, increased muscle spasms, impaired UE functional use, postural dysfunction, obesity, and pain.   ACTIVITY LIMITATIONS: carrying, lifting, bathing, toileting, dressing, reach over head, and caring for others  PARTICIPATION LIMITATIONS: meal prep, cleaning, laundry, driving, shopping, community activity, yard work, and church  PERSONAL FACTORS: Age, Behavior pattern, Fitness, Past/current experiences, Social background, and Time since onset of injury/illness/exacerbation are also affecting patient's functional outcome.   REHAB POTENTIAL: Fair chronicity of impairments   CLINICAL DECISION MAKING: Stable/uncomplicated  EVALUATION COMPLEXITY: Low   GOALS: Goals reviewed with patient? No  SHORT TERM GOALS: Target date: 03/09/2023    Will be compliant with appropriate progressive HEP  Baseline: Goal status: on going 02/16/2023  2.  R shoulder flexion and ABD AROM to be equal to that of left  Baseline:  Goal status: on going 02/16/2023  3.  R shoulder FER AROM to be equal to that of the left  Baseline:  Goal status: on going 02/16/2023  4.  Will demonstrate improved postural awareness and biomechanics to facilitate good GH and scapular mechanics  Baseline:  Goal status: on going 02/16/2023    LONG TERM GOALS: Target date: 04/06/2023    MMT to have improved by at least 1 grade in all weak groups  Baseline:  Goal status: INITIAL  2.  Pain R shoulder to be no more than 4/10 at worst  Baseline:  Goal status: INITIAL  3.  Will be able to perform all self and home care tasks without difficulty or increased pain R UE  Baseline:  Goal status: INITIAL  4.  Will be able to reach overhead without difficulty or increased pain R UE  Baseline:  Goal  status: INITIAL  5.  FOTO score to be within 10 points of predicted by time of  DC  Baseline:  Goal status: INITIAL    PLAN:  PT FREQUENCY: 1-2x/week  PT DURATION: 8 weeks  PLANNED INTERVENTIONS: 97164- PT Re-evaluation, 97110-Therapeutic exercises, 97530- Therapeutic activity, [id number]- Neuromuscular re-education, 97535- Self Care, 16109- Manual therapy, [id number]- Aquatic Therapy, 97014- Electrical stimulation (unattended), 97016- Vasopneumatic device, [id number]- Ionotophoresis 4mg /ml Dexamethasone, Taping, Dry Needling, Cryotherapy, and Moist heat  PLAN FOR NEXT SESSION:  Rt shoulder early strengthening avoiding shrug.   Chyrel Masson, PT, DPT, OCS, ATC 02/16/23  8:44 AM

## 2023-02-16 ENCOUNTER — Encounter: Payer: Self-pay | Admitting: Rehabilitative and Restorative Service Providers"

## 2023-02-16 ENCOUNTER — Ambulatory Visit: Payer: No Typology Code available for payment source | Admitting: Rehabilitative and Restorative Service Providers"

## 2023-02-16 DIAGNOSIS — R293 Abnormal posture: Secondary | ICD-10-CM | POA: Diagnosis not present

## 2023-02-16 DIAGNOSIS — G8929 Other chronic pain: Secondary | ICD-10-CM

## 2023-02-16 DIAGNOSIS — M25611 Stiffness of right shoulder, not elsewhere classified: Secondary | ICD-10-CM

## 2023-02-16 DIAGNOSIS — M6281 Muscle weakness (generalized): Secondary | ICD-10-CM

## 2023-02-16 DIAGNOSIS — M25511 Pain in right shoulder: Secondary | ICD-10-CM | POA: Diagnosis not present

## 2023-02-21 ENCOUNTER — Ambulatory Visit: Payer: No Typology Code available for payment source | Admitting: Physical Therapy

## 2023-02-21 ENCOUNTER — Encounter: Payer: Self-pay | Admitting: Physical Therapy

## 2023-02-21 DIAGNOSIS — M6281 Muscle weakness (generalized): Secondary | ICD-10-CM | POA: Diagnosis not present

## 2023-02-21 DIAGNOSIS — G8929 Other chronic pain: Secondary | ICD-10-CM

## 2023-02-21 DIAGNOSIS — M25611 Stiffness of right shoulder, not elsewhere classified: Secondary | ICD-10-CM

## 2023-02-21 DIAGNOSIS — M25511 Pain in right shoulder: Secondary | ICD-10-CM

## 2023-02-21 DIAGNOSIS — R293 Abnormal posture: Secondary | ICD-10-CM | POA: Diagnosis not present

## 2023-02-21 NOTE — Therapy (Signed)
OUTPATIENT PHYSICAL THERAPY TREATMENT   Patient Name: Margaret Gordon MRN: [id number] DOB:[birth year], 82 y.o., female Today's Date: 02/21/2023  END OF SESSION:  PT End of Session - 02/21/23 1425     Visit Number 3    Number of Visits 17    Date for PT Re-Evaluation 04/06/23    Authorization Type Devoted $15    Authorization Time Period 02/09/23 to 04/06/23    PT Start Time 1347    PT Stop Time 1428    PT Time Calculation (min) 41 min    Activity Tolerance Patient limited by pain    Behavior During Therapy [facility] for tasks assessed/performed               Past Medical History:  Diagnosis Date   Anemia    hx   Arthritis    Asthma    GERD (gastroesophageal reflux disease)    History of chicken pox    History of glaucoma    History of hiatal hernia    Hyperlipidemia    Hypertension    Migraines    Pneumonia    hx   Scoliosis    Past Surgical History:  Procedure Laterality Date   LUMBAR LAMINECTOMY/DECOMPRESSION MICRODISCECTOMY Left 11/02/2016   Procedure: Left Lumbar Three-Four, Lumbar Four-Five Laminectomy and Foraminotomy;  Surgeon: Tia Alert, MD;  Location: [facility] OR;  Service: Neurosurgery;  Laterality: Left;   TONSILLECTOMY     VESICOVAGINAL FISTULA CLOSURE W/ TAH     Patient Active Problem List   Diagnosis Date Noted   Renal cyst 02/13/2023   Blood creatinine decreased compared with prior measurement 11/03/2022   Chronic right shoulder pain 11/03/2022   Periorbital cellulitis of right eye 06/28/2022   Peripheral edema 06/06/2022   Venous insufficiency 06/06/2022   History of vertigo 02/08/2022   Benign paroxysmal positional vertigo due to bilateral vestibular disorder 01/05/2022   Trochanteric bursitis of right hip 01/05/2022   Decreased GFR 05/10/2021   History of colonic polyps 04/07/2021   Thyroid nodule 04/07/2021   GERD (gastroesophageal reflux disease) 04/07/2021   Chronic bilateral low back pain with right-sided sciatica 04/07/2021    Scoliosis 04/07/2021   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   Osteoarthritis, 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   Hypertension 04/07/2021   High cholesterol 04/07/2021   History of anemia 04/07/2021   Body mass index (BMI) of 33.0 to 33.9 in adult 04/07/2021   Vitamin D deficiency 04/07/2021   OA (osteoarthritis of spine) 01/16/2017   S/P lumbar laminectomy 11/02/2016   Abnormal CT scan, chest 04/01/2013   Upper airway cough syndrome 01/05/2013    PCP: Kerby Nora MD   REFERRING PROVIDER: Hannah Beat, MD  REFERRING DIAG: Diagnosis M25.511,G89.29 (ICD-10-CM) - Chronic right shoulder pain 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 Stretching is helping,  HEP   Hand dominance: Right  PERTINENT HISTORY: See above   PAIN:  NPRS scale: 7-8/10 now, 8/10 at worst past week  Pain location: Rt shoulder  Pain description: aggravating, nagging sometimes, tingling sometimes  Aggravating factors: reaching with RUE especially [place]  Relieving factors: shot from MD  PRECAUTIONS: None  RED FLAGS: None   WEIGHT BEARING RESTRICTIONS: No  FALLS:  Has patient fallen in last 6 months? 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  DIAGNOSTIC FINDINGS:  02/09/2023 review:   RIGHT SHOULDER - 2+ VIEW   IMPRESSION: Degenerative change and findings suspicious for rotator cuff injury.      PATIENT SURVEYS:  02/09/2023 FOTO 40, predicted 58 in 13 visits   COGNITION: 02/09/2023 Overall cognitive status: Within functional limits for tasks assessed     SENSATION: 02/09/2023 Not tested  POSTURE: 02/09/2023 Forward head, rounded shoulders, increased thoracic kyphosis   UPPER EXTREMITY MMT:   MMT Right 02/09/2023   Left 02/09/2023  Shoulder flexion 3+  4+  Shoulder extension    Shoulder abduction 3- 4+  Shoulder adduction    Shoulder internal rotation 5 4+  Shoulder external rotation 3 at best  4  Elbow flexion    Elbow extension    Wrist flexion    Wrist extension    Wrist ulnar deviation    Wrist radial deviation    Wrist pronation    Wrist supination    (Blank rows = not tested)  UPPER EXTREMITY ROM:  ROM Right 02/09/2023 AROM Left 02/09/2023 Right 02/16/2023  Shoulder flexion 100* UT compensations  140* 140 AROM in supine  Shoulder extension 95* UT compensations  110*   Shoulder abduction     Shoulder adduction     Shoulder internal rotation FIR L4 FIR L4    Shoulder external rotation FER C6 FER suboccipitals    Middle trapezius     Lower trapezius     Elbow flexion     Elbow extension     Wrist flexion     Wrist extension     Wrist ulnar deviation     Wrist radial deviation     Wrist pronation     Wrist supination     Grip strength (lbs)     (Blank rows = not tested)  PALPATION:  02/09/2023 R anterior/middle/posterior delts and upper traps very  tender to touch                    TODAY'S TREATMENT:      DATE:   02/21/23  TherEx  Scifit bike L2 x4 min forward/4 min backwards (8 min total) Supine flexion AAROM 1# dowel 15x2 second holds Supine ABD AAROM 1# dowel 15x2 second holds Supine shoulder flexion 1# x8 Shoulder flexion isometric 12x3 seconds Shoulder ABD isometric 12x3 seconds  Shoulder ER isometrics 12x3 seconds  02/16/2023 Therex:  Supine Rt shoulder AROM flexion x 15 Sidelying Rt shoulder AROM abduction x 15 Sidelying Rt shoulder ER c towel under arm x 15  Standing green band rows 2 x 10  Standing green band gh ext 2 x 10   Manual  Rt shoulder g2 inferior mobs in elevation, Mobilization c movement posterior glide with ER passively    TODAY'S TREATMENT:      DATE: 02/09/23 exam findings, POC, HEP   UBE L2x3 min forward/3 min backwards for w/u and tissue perfusion Scap retractions 10x3 seconds Backwards shoulder rolls x20  Shoulder flexion stretch on door 10x5 seconds     PATIENT EDUCATION: 02/16/2023 Education details: HEP progression  Person educated: Patient Education method: Programmer, multimedia, Facilities manager, and Handouts Education comprehension: verbalized understanding, returned demonstration, and needs further education  HOME EXERCISE PROGRAM:  Access Code: [email or web address] URL: [email or web address] Date: 02/21/2023 Prepared by: Nedra Hai  Exercises - Seated Scapular Retraction  - 1 x daily - 7 x weekly - 2 sets - 10 reps - 3 seconds  hold - Standing Single Arm Shoulder Flexion Stretch on Wall  - 2 x daily - 7 x weekly - 1 sets - 10 reps - 5 seconds  hold - Supine Shoulder Flexion Extension Full Range AROM  - 1-2 x daily - 7 x weekly - 1-2 sets - 10-15 reps - Sidelying Shoulder Abduction Palm Forward  - 1-2 x daily - 7 x weekly - 1-2 sets - 10-15 reps - Sidelying Shoulder External Rotation (Mirrored)  - 1-2 x daily - 7 x weekly - 1-2 sets - 10-15 reps - Standing Isometric  Shoulder Flexion with Doorway  - 1 x daily - 7 x weekly - 3 sets - 10 reps - Standing Isometric Shoulder Abduction with Doorway  - 1 x daily - 7 x weekly - 3 sets - 10 reps - Standing Isometric Shoulder External Rotation with Doorway  - 1 x daily - 7 x weekly - 3 sets - 10 reps ASSESSMENT:  CLINICAL IMPRESSION:  Pt arrives doing OK, feels the stretching in her HEP has been helpful. Shoulder is very weak and difficult to move against gravity, introduced conservative focus on UE strength training today as time/pain allowed. Did well and motivated to improve. Back is still hurting her, she is getting a CT scan to further investigate tomorrow.   OBJECTIVE IMPAIRMENTS: decreased activity tolerance, decreased ROM, decreased strength, hypomobility, increased fascial restrictions, increased muscle spasms, impaired UE functional use, postural dysfunction, obesity, and pain.   ACTIVITY LIMITATIONS: carrying, lifting, bathing, toileting, dressing, reach over head, and caring for others  PARTICIPATION LIMITATIONS: meal prep, cleaning, laundry, driving, shopping, community activity, yard work, and church  PERSONAL FACTORS: Age, Behavior pattern, Fitness, Past/current experiences, Social background, and Time since onset of injury/illness/exacerbation are also affecting patient's functional outcome.   REHAB POTENTIAL: Fair chronicity of impairments   CLINICAL DECISION MAKING: Stable/uncomplicated  EVALUATION COMPLEXITY: Low   GOALS: Goals reviewed with patient? No  SHORT TERM GOALS: Target date: 03/09/2023    Will be compliant with appropriate progressive HEP  Baseline: Goal status: on going 02/16/2023  2.  R shoulder flexion and ABD AROM to be equal to that of left  Baseline:  Goal status: on going 02/16/2023  3.  R shoulder FER AROM to be equal to that of the left  Baseline:  Goal status: on going 02/16/2023  4.  Will demonstrate improved postural awareness and biomechanics to facilitate  good GH and scapular mechanics  Baseline:  Goal status: on going 02/16/2023    LONG TERM GOALS: Target date: 04/06/2023    MMT to have improved by at least 1 grade in all weak groups  Baseline:  Goal status: INITIAL  2.  Pain R shoulder to be no more than 4/10 at worst  Baseline:  Goal status: INITIAL  3.  Will be able to perform all self and home care tasks without difficulty or increased pain R UE  Baseline:  Goal status: INITIAL  4.  Will be able to reach overhead without difficulty or increased pain R UE  Baseline:  Goal status: INITIAL  5.  FOTO score to be within 10 points of predicted by time of DC  Baseline:  Goal status: INITIAL    PLAN:  PT FREQUENCY: 1-2x/week  PT DURATION: 8 weeks  PLANNED INTERVENTIONS: 97164- PT Re-evaluation, 97110-Therapeutic exercises, 97530- Therapeutic activity, 97112- Neuromuscular re-education, 97535- Self Care, 78295- Manual therapy, [id number]- Aquatic Therapy, 97014- Electrical stimulation (unattended), 97016- Vasopneumatic device, [id number]- Ionotophoresis 4mg /ml Dexamethasone, Taping, Dry Needling, Cryotherapy, and Moist heat  PLAN FOR NEXT SESSION:  Rt shoulder early strengthening avoiding shrug. How did HEP update feel?   Nedra Hai, PT, DPT 02/21/23 2:28 PM

## 2023-02-22 ENCOUNTER — Ambulatory Visit (HOSPITAL_COMMUNITY)
Admission: RE | Admit: 2023-02-22 | Discharge: 2023-02-22 | Disposition: A | Discharge status: Home / Self Care | Payer: No Typology Code available for payment source | Source: Ambulatory Visit | Attending: Neurological Surgery | Admitting: Neurological Surgery

## 2023-02-22 DIAGNOSIS — M899 Disorder of bone, unspecified: Secondary | ICD-10-CM

## 2023-02-22 DIAGNOSIS — M4310 Spondylolisthesis, site unspecified: Secondary | ICD-10-CM | POA: Diagnosis not present

## 2023-02-22 DIAGNOSIS — M5134 Other intervertebral disc degeneration, thoracic region: Secondary | ICD-10-CM | POA: Diagnosis not present

## 2023-02-22 DIAGNOSIS — M4184 Other forms of scoliosis, thoracic region: Secondary | ICD-10-CM | POA: Diagnosis not present

## 2023-02-22 DIAGNOSIS — M47814 Spondylosis without myelopathy or radiculopathy, thoracic region: Secondary | ICD-10-CM | POA: Diagnosis not present

## 2023-02-22 DIAGNOSIS — N289 Disorder of kidney and ureter, unspecified: Secondary | ICD-10-CM | POA: Diagnosis not present

## 2023-02-22 DIAGNOSIS — N281 Cyst of kidney, acquired: Secondary | ICD-10-CM | POA: Diagnosis not present

## 2023-02-22 MED ORDER — IOHEXOL 350 MG/ML SOLN
75.0000 mL | Freq: Once | INTRAVENOUS | Status: AC | PRN
  Administered 2023-02-22: 75 mL via INTRAVENOUS

## 2023-02-22 NOTE — Progress Notes (Signed)
Called to eval contrast/saline extrav following CT scan. (R) AC PIV site examined. Small area of focal swelling and ecchymosis but soft, minimally tender.  Instructions and precautions reviewed. Elevation, cool/warm compresses.  Brayton El PA-C Interventional Radiology 02/22/2023 3:51 PM

## 2023-02-25 ENCOUNTER — Other Ambulatory Visit: Payer: Self-pay | Admitting: Family Medicine

## 2023-02-26 MED ORDER — TRAMADOL HCL 50 MG PO TABS: 50.0000 mg | ORAL_TABLET | Freq: Four times a day (QID) | ORAL | 0 refills | Status: DC | PRN

## 2023-02-26 NOTE — Telephone Encounter (Signed)
Last office visit 02/13/23 for CPE.  Last refilled 12/19/2022 for #30 with no refills.  Next Appt: 03/28/23 with Dr. Patsy Lager for frozen shoulder.

## 2023-03-02 ENCOUNTER — Encounter: Payer: Self-pay | Admitting: Physical Therapy

## 2023-03-02 ENCOUNTER — Ambulatory Visit: Payer: No Typology Code available for payment source | Admitting: Physical Therapy

## 2023-03-02 DIAGNOSIS — G8929 Other chronic pain: Secondary | ICD-10-CM | POA: Diagnosis not present

## 2023-03-02 DIAGNOSIS — M25511 Pain in right shoulder: Secondary | ICD-10-CM

## 2023-03-02 DIAGNOSIS — M25611 Stiffness of right shoulder, not elsewhere classified: Secondary | ICD-10-CM | POA: Diagnosis not present

## 2023-03-02 DIAGNOSIS — M6281 Muscle weakness (generalized): Secondary | ICD-10-CM

## 2023-03-02 DIAGNOSIS — R293 Abnormal posture: Secondary | ICD-10-CM | POA: Diagnosis not present

## 2023-03-02 NOTE — Therapy (Signed)
OUTPATIENT PHYSICAL THERAPY TREATMENT   Patient Name: Margaret Gordon MRN: [id number] DOB:10-Jul-1940, 82 y.o., female Today's Date: 03/02/2023  END OF SESSION:  PT End of Session - 03/02/23 1308     Visit Number 4    Number of Visits 17    Date for PT Re-Evaluation 04/06/23    Authorization Type Devoted $15    Authorization Time Period 02/09/23 to 04/06/23    PT Start Time 1302    PT Stop Time 1340    PT Time Calculation (min) 38 min    Activity Tolerance Patient tolerated treatment well;Patient limited by pain    Behavior During Therapy WFL for tasks assessed/performed                Past Medical History:  Diagnosis Date   Anemia    hx   Arthritis    Asthma    GERD (gastroesophageal reflux disease)    History of chicken pox    History of glaucoma    History of hiatal hernia    Hyperlipidemia    Hypertension    Migraines    Pneumonia    hx   Scoliosis    Past Surgical History:  Procedure Laterality Date   LUMBAR LAMINECTOMY/DECOMPRESSION MICRODISCECTOMY Left 11/02/2016   Procedure: Left Lumbar Three-Four, Lumbar Four-Five Laminectomy and Foraminotomy;  Surgeon: Tia Alert, MD;  Location: [facility] OR;  Service: Neurosurgery;  Laterality: Left;   TONSILLECTOMY     VESICOVAGINAL FISTULA CLOSURE W/ TAH     Patient Active Problem List   Diagnosis Date Noted   Renal cyst 02/13/2023   Blood creatinine decreased compared with prior measurement 11/03/2022   Chronic right shoulder pain 11/03/2022   Periorbital cellulitis of right eye 06/28/2022   Peripheral edema 06/06/2022   Venous insufficiency 06/06/2022   History of vertigo 02/08/2022   Benign paroxysmal positional vertigo due to bilateral vestibular disorder 01/05/2022   Trochanteric bursitis of right hip 01/05/2022   Decreased GFR 05/10/2021   History of colonic polyps 04/07/2021   Thyroid nodule 04/07/2021   GERD (gastroesophageal reflux disease) 04/07/2021   Chronic bilateral low back pain with  right-sided sciatica 04/07/2021   Scoliosis 04/07/2021   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   Osteoarthritis, 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   Hypertension 04/07/2021   High cholesterol 04/07/2021   History of anemia 04/07/2021   Body mass index (BMI) of 33.0 to 33.9 in adult 04/07/2021   Vitamin D deficiency 04/07/2021   OA (osteoarthritis of spine) 01/16/2017   S/P lumbar laminectomy 11/02/2016   Abnormal CT scan, chest 04/01/2013   Upper airway cough syndrome 01/05/2013    PCP: Kerby Nora MD   REFERRING PROVIDER: Hannah Beat, MD  REFERRING DIAG: Diagnosis M25.511,G89.29 (ICD-10-CM) - Chronic right shoulder pain 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 The new additions you gave me   Hand dominance: Right  PERTINENT HISTORY: See above   PAIN:  NPRS scale: 7/10 now; over the past week still up to 8/10 at worst  Pain location: Rt shoulder  Pain description: aggravating, nagging sometimes, tingling sometimes  Aggravating factors: reaching with RUE especially [place]  Relieving factors: shot from MD  PRECAUTIONS: None  RED FLAGS: None   WEIGHT BEARING RESTRICTIONS: No  FALLS:  Has patient fallen in last 6 months? 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  DIAGNOSTIC FINDINGS:  02/09/2023 review:   RIGHT SHOULDER - 2+ VIEW   IMPRESSION: Degenerative change and findings suspicious for rotator cuff injury.      PATIENT SURVEYS:  02/09/2023 FOTO 40, predicted 58 in 13 visits   COGNITION: 02/09/2023 Overall cognitive status: Within functional limits for tasks assessed     SENSATION: 02/09/2023 Not tested  POSTURE: 02/09/2023 Forward head, rounded shoulders, increased thoracic kyphosis   UPPER EXTREMITY MMT:   MMT Right 02/09/2023   Left 02/09/2023  Shoulder flexion 3+  4+  Shoulder extension    Shoulder abduction 3- 4+  Shoulder adduction    Shoulder internal rotation 5 4+  Shoulder external rotation 3 at best  4  Elbow flexion    Elbow extension    Wrist flexion    Wrist extension    Wrist ulnar deviation    Wrist radial deviation    Wrist pronation    Wrist supination    (Blank rows = not tested)  UPPER EXTREMITY ROM:  ROM Right 02/09/2023 AROM Left 02/09/2023 Right 02/16/2023 Right 03/02/23 AROM sitting   Shoulder flexion 100* UT compensations  140* 140 AROM in supine 145* UT compensation  Shoulder extension 95* UT compensations  110*  110*  Shoulder abduction      Shoulder adduction      Shoulder internal rotation FIR L4 FIR L4   FIR L3   Shoulder external rotation FER C6 FER suboccipitals   FER to upper traps at base of neck   Middle trapezius      Lower trapezius      Elbow flexion      Elbow extension      Wrist flexion      Wrist extension      Wrist ulnar deviation      Wrist  radial deviation      Wrist pronation      Wrist supination      Grip strength (lbs)      (Blank rows = not tested)  PALPATION:  02/09/2023 R anterior/middle/posterior delts and upper traps very tender to touch                    TODAY'S TREATMENT:      DATE:   03/02/23  TherEx  UBE L2 x4 minutes forward/x4 minutes  backwards (8 min total) Updated ROM- see table above Supine shoulder flexion AAROM 15x2 second holds with 1# rod  Supine serratus punch 2# x10 R UE Supine ABD AAROM 15x2 second holds with 1# rod Supine serratus punch 2# R UE CW/CCW circles x10 each Supine shoulder flexion 1# 2x10 R UE  L stretch at counter for shoulder flexion ROM 3x10 seconds      02/21/23  TherEx  Scifit bike L2 x4 min forward/4 min backwards (8 min total) Supine flexion AAROM 1# dowel 15x2 second holds Supine ABD AAROM 1# dowel 15x2 second holds Supine shoulder flexion 1# x8 Shoulder flexion isometric 12x3 seconds Shoulder ABD isometric 12x3 seconds  Shoulder ER isometrics 12x3 seconds       02/16/2023 Therex:  Supine Rt shoulder AROM flexion x 15 Sidelying Rt shoulder AROM abduction x 15 Sidelying Rt shoulder ER c towel under arm x 15  Standing green band rows 2 x 10  Standing green band gh ext 2 x 10   Manual  Rt shoulder g2 inferior mobs in elevation, Mobilization c movement posterior glide with ER passively    TODAY'S TREATMENT:      DATE: 02/09/23 exam findings, POC, HEP   UBE L2x3 min forward/3 min backwards for w/u and tissue perfusion Scap retractions 10x3 seconds Backwards shoulder rolls x20  Shoulder flexion stretch on door 10x5 seconds     PATIENT EDUCATION: 02/16/2023 Education details: HEP progression  Person educated: Patient Education method: Programmer, multimedia, Facilities manager, and Handouts Education comprehension: verbalized understanding, returned demonstration, and needs further education  HOME EXERCISE PROGRAM:  Access Code: [email or web address] URL:  [email or web address] Date: 02/21/2023 Prepared by: Nedra Hai  Exercises - Seated Scapular Retraction  - 1 x daily - 7 x weekly - 2 sets - 10 reps - 3 seconds  hold - Standing Single Arm Shoulder Flexion Stretch on Wall  - 2 x daily - 7 x weekly - 1 sets - 10 reps - 5 seconds  hold - Supine Shoulder Flexion Extension Full Range AROM  - 1-2 x daily - 7 x weekly - 1-2 sets - 10-15 reps - Sidelying Shoulder Abduction Palm Forward  - 1-2 x daily - 7 x weekly - 1-2 sets - 10-15 reps - Sidelying Shoulder External Rotation (Mirrored)  - 1-2 x daily - 7 x weekly - 1-2 sets - 10-15 reps - Standing Isometric Shoulder Flexion with Doorway  - 1 x daily - 7 x weekly - 3 sets - 10 reps - Standing Isometric Shoulder Abduction with Doorway  - 1 x daily - 7 x weekly - 3 sets - 10 reps - Standing Isometric Shoulder External Rotation with Doorway  - 1 x daily - 7 x weekly - 3 sets - 10 reps ASSESSMENT:  CLINICAL IMPRESSION:  Pt arrives today doing well today, shoulder is starting to feel a little better. Still fairly stiff and painful. Updated ROM measures today, she is making slow and steady progress but does still have notable upper trap compensations in gravity resisted positions. Continued working on general ROM and strength as able today, will continue efforts.   OBJECTIVE IMPAIRMENTS: decreased activity tolerance, decreased ROM, decreased strength, hypomobility, increased fascial restrictions, increased muscle spasms, impaired UE functional use, postural dysfunction, obesity, and pain.   ACTIVITY LIMITATIONS: carrying, lifting, bathing, toileting, dressing, reach over head, and caring for others  PARTICIPATION LIMITATIONS: meal prep, cleaning, laundry, driving, shopping, community activity, yard work, and church  PERSONAL FACTORS: Age, Behavior pattern, Fitness, Past/current experiences, Social background, and  Time since onset of injury/illness/exacerbation are also affecting patient's  functional outcome.   REHAB POTENTIAL: Fair chronicity of impairments   CLINICAL DECISION MAKING: Stable/uncomplicated  EVALUATION COMPLEXITY: Low   GOALS: Goals reviewed with patient? No  SHORT TERM GOALS: Target date: 03/09/2023    Will be compliant with appropriate progressive HEP  Baseline: Goal status: on going 02/16/2023  2.  R shoulder flexion and ABD AROM to be equal to that of left  Baseline:  Goal status: on going 02/16/2023  3.  R shoulder FER AROM to be equal to that of the left  Baseline:  Goal status: on going 02/16/2023  4.  Will demonstrate improved postural awareness and biomechanics to facilitate good GH and scapular mechanics  Baseline:  Goal status: on going 02/16/2023    LONG TERM GOALS: Target date: 04/06/2023    MMT to have improved by at least 1 grade in all weak groups  Baseline:  Goal status: INITIAL  2.  Pain R shoulder to be no more than 4/10 at worst  Baseline:  Goal status: INITIAL  3.  Will be able to perform all self and home care tasks without difficulty or increased pain R UE  Baseline:  Goal status: INITIAL  4.  Will be able to reach overhead without difficulty or increased pain R UE  Baseline:  Goal status: INITIAL  5.  FOTO score to be within 10 points of predicted by time of DC  Baseline:  Goal status: INITIAL    PLAN:  PT FREQUENCY: 1-2x/week  PT DURATION: 8 weeks  PLANNED INTERVENTIONS: 97164- PT Re-evaluation, 97110-Therapeutic exercises, 97530- Therapeutic activity, 97112- Neuromuscular re-education, 97535- Self Care, 11914- Manual therapy, [id number]- Aquatic Therapy, 97014- Electrical stimulation (unattended), 97016- Vasopneumatic device, 97033- Ionotophoresis 4mg /ml Dexamethasone, Taping, Dry Needling, Cryotherapy, and Moist heat  PLAN FOR NEXT SESSION:  Rt shoulder ROM and  early strengthening avoiding shrug.     Nedra Hai, PT, DPT 03/02/23 1:40 PM

## 2023-03-05 ENCOUNTER — Encounter: Payer: No Typology Code available for payment source | Admitting: Physical Therapy

## 2023-03-12 ENCOUNTER — Encounter: Payer: Self-pay | Admitting: Physical Therapy

## 2023-03-12 ENCOUNTER — Ambulatory Visit: Payer: No Typology Code available for payment source | Admitting: Physical Therapy

## 2023-03-12 DIAGNOSIS — M25511 Pain in right shoulder: Secondary | ICD-10-CM | POA: Diagnosis not present

## 2023-03-12 DIAGNOSIS — M6281 Muscle weakness (generalized): Secondary | ICD-10-CM | POA: Diagnosis not present

## 2023-03-12 DIAGNOSIS — M25611 Stiffness of right shoulder, not elsewhere classified: Secondary | ICD-10-CM

## 2023-03-12 DIAGNOSIS — G8929 Other chronic pain: Secondary | ICD-10-CM | POA: Diagnosis not present

## 2023-03-12 NOTE — Therapy (Signed)
 OUTPATIENT PHYSICAL THERAPY TREATMENT   Patient Name: Margaret Gordon MRN: [id number] DOB:[birth year], 83 y.o., female Today's Date: 03/12/2023  END OF SESSION:  PT End of Session - 03/12/23 1139     Visit Number 5    Number of Visits 17    Date for PT Re-Evaluation 04/06/23    Authorization Type Devoted $15    Authorization Time Period 02/09/23 to 04/06/23    PT Start Time 1100    PT Stop Time 1140    PT Time Calculation (min) 40 min    Activity Tolerance Patient tolerated treatment well;Patient limited by pain    Behavior During Therapy WFL for tasks assessed/performed                 Past Medical History:  Diagnosis Date   Anemia    hx   Arthritis    Asthma    GERD (gastroesophageal reflux disease)    History of chicken pox    History of glaucoma    History of hiatal hernia    Hyperlipidemia    Hypertension    Migraines    Pneumonia    hx   Scoliosis    Past Surgical History:  Procedure Laterality Date   LUMBAR LAMINECTOMY/DECOMPRESSION MICRODISCECTOMY Left 11/02/2016   Procedure: Left Lumbar Three-Four, Lumbar Four-Five Laminectomy and Foraminotomy;  Surgeon: Joshua Alm RAMAN, MD;  Location: [facility] OR;  Service: Neurosurgery;  Laterality: Left;   TONSILLECTOMY     VESICOVAGINAL FISTULA CLOSURE W/ TAH     Patient Active Problem List   Diagnosis Date Noted   Renal cyst 02/13/2023   Blood creatinine decreased compared with prior measurement 11/03/2022   Chronic right shoulder pain 11/03/2022   Periorbital cellulitis of right eye 06/28/2022   Peripheral edema 06/06/2022   Venous insufficiency 06/06/2022   History of vertigo 02/08/2022   Benign paroxysmal positional vertigo due to bilateral vestibular disorder 01/05/2022   Trochanteric bursitis of right hip 01/05/2022   Decreased GFR 05/10/2021   History of colonic polyps 04/07/2021   Thyroid  nodule 04/07/2021   GERD (gastroesophageal reflux disease) 04/07/2021   Chronic bilateral low back pain with  right-sided sciatica 04/07/2021   Scoliosis 04/07/2021   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   Osteoarthritis, 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   Hypertension 04/07/2021   High cholesterol 04/07/2021   History of anemia 04/07/2021   Body mass index (BMI) of 33.0 to 33.9 in adult 04/07/2021   Vitamin D  deficiency 04/07/2021   OA (osteoarthritis of spine) 01/16/2017   S/P lumbar laminectomy 11/02/2016   Abnormal CT scan, chest 04/01/2013   Upper airway cough syndrome 01/05/2013    PCP: Avelina No MD   REFERRING PROVIDER: Watt Mirza, MD  REFERRING DIAG: Diagnosis M25.511,G89.29 (ICD-10-CM) - Chronic 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    Hand dominance: Right  PERTINENT HISTORY: See above   PAIN:  NPRS scale: 7/10 today Pain location: Rt shoulder  Pain description: aggravating, nagging sometimes, tingling sometimes  Aggravating factors: reaching with RUE especially [place]  Relieving factors: shot from MD  PRECAUTIONS: None  RED FLAGS: None   WEIGHT BEARING RESTRICTIONS: No  FALLS:  Has patient fallen in last 6 months? 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  DIAGNOSTIC FINDINGS:  02/09/2023 review:   RIGHT SHOULDER - 2+ VIEW   IMPRESSION: Degenerative change and findings suspicious for rotator cuff injury.      PATIENT SURVEYS:  02/09/2023 FOTO 40, predicted 58 in 13 visits   COGNITION: 02/09/2023 Overall cognitive status: Within functional limits for tasks assessed     SENSATION: 02/09/2023 Not tested  POSTURE: 02/09/2023 Forward head, rounded shoulders, increased thoracic kyphosis   UPPER EXTREMITY MMT:   MMT Right 02/09/2023   Left 02/09/2023  Shoulder flexion 3+  4+  Shoulder extension    Shoulder abduction 3- 4+  Shoulder adduction    Shoulder internal rotation 5 4+  Shoulder external rotation 3 at best  4  Elbow flexion    Elbow extension    Wrist flexion    Wrist extension    Wrist ulnar deviation    Wrist radial deviation    Wrist pronation    Wrist supination    (Blank rows = not tested)  UPPER EXTREMITY ROM:  ROM Right 02/09/2023 AROM Left 02/09/2023 Right 02/16/2023 Right 03/02/23 AROM sitting   Shoulder flexion 100* UT compensations  140* 140 AROM in supine 145* UT compensation  Shoulder extension 95* UT compensations  110*  110*  Shoulder abduction      Shoulder adduction      Shoulder internal rotation FIR L4 FIR L4   FIR L3   Shoulder external rotation FER C6 FER suboccipitals   FER to upper traps at base of neck   Middle trapezius      Lower trapezius      Elbow flexion      Elbow extension      Wrist flexion      Wrist extension      Wrist ulnar deviation      Wrist radial deviation      Wrist pronation      Wrist supination      Grip strength (lbs)      (Blank rows = not tested)  PALPATION:   02/09/2023 R anterior/middle/posterior delts and upper traps very tender to touch                    TODAY'S TREATMENT:      DATE:   03/11/22 TherEx UBE L2 x4 minutes forward/x4 minutes backwards (8 min total) Standing rows red 2X10 Standing shoulder extensions red 2X10 Standing Rt shoudler IR red 2X10 Seated (to rest her back/knee) Rt shoulder ER red  2X10 Supine shoulder flexion AAROM 15x2 second holds with 2# rod  Supine serratus punch 2# x10 R UE Supine ABD AAROM 15x2 second holds with 1# rod Supine serratus punch 2# R UE CW/CCW circles x10 each Supine shoulder flexion 1# 2x10 R UE  L stretch at counter for shoulder flexion ROM 4x10 seconds     03/02/23  TherEx  UBE L2 x4 minutes forward/x4 minutes backwards (8 min total) Updated ROM- see table above Supine shoulder flexion AAROM 15x2 second holds with 1# rod  Supine serratus punch 2# x10 R UE Supine ABD AAROM 15x2 second holds with 1# rod Supine serratus punch 2# R UE CW/CCW circles x10 each Supine shoulder flexion 1# 2x10 R UE  L stretch at counter for shoulder flexion ROM 3x10 seconds      02/21/23  TherEx  Scifit bike L2 x4 min forward/4 min backwards (8 min total) Supine flexion AAROM 1# dowel 15x2 second holds Supine ABD AAROM 1# dowel 15x2 second holds Supine shoulder flexion 1# x8 Shoulder flexion isometric 12x3 seconds Shoulder ABD isometric 12x3 seconds  Shoulder ER isometrics 12x3 seconds       02/16/2023 Therex:  Supine Rt shoulder AROM flexion x 15 Sidelying Rt shoulder AROM abduction x 15 Sidelying Rt shoulder ER c towel under arm x 15  Standing green band rows 2 x 10  Standing green band gh ext 2 x 10   Manual  Rt shoulder g2 inferior mobs in elevation, Mobilization c movement posterior glide with ER passively    TODAY'S TREATMENT:      DATE: 02/09/23 exam findings, POC, HEP   UBE L2x3 min forward/3 min backwards for w/u and tissue perfusion Scap retractions 10x3  seconds Backwards shoulder rolls x20  Shoulder flexion stretch on door 10x5 seconds     PATIENT EDUCATION: 02/16/2023 Education details: HEP progression  Person educated: Patient Education method: Programmer, Multimedia, Facilities Manager, and Handouts Education comprehension: verbalized understanding, returned demonstration, and needs further education  HOME EXERCISE PROGRAM:  Access Code: [email or web address] URL: [email or web address] Date: 02/21/2023 Prepared by: Josette Rough  Exercises - Seated Scapular Retraction  - 1 x daily - 7 x weekly - 2 sets - 10 reps - 3 seconds  hold - Standing Single Arm Shoulder Flexion Stretch on Wall  - 2 x daily - 7 x weekly - 1 sets - 10 reps - 5 seconds  hold - Supine Shoulder Flexion Extension Full Range AROM  - 1-2 x daily - 7 x weekly - 1-2 sets - 10-15 reps - Sidelying Shoulder Abduction Palm Forward  - 1-2 x daily - 7 x weekly - 1-2 sets - 10-15 reps - Sidelying Shoulder External Rotation (Mirrored)  - 1-2 x daily - 7 x weekly - 1-2 sets - 10-15 reps - Standing Isometric Shoulder Flexion with Doorway  - 1 x daily - 7 x weekly - 3 sets - 10 reps - Standing Isometric Shoulder Abduction with Doorway  - 1 x daily - 7 x weekly - 3 sets - 10 reps - Standing Isometric Shoulder External Rotation with Doorway  - 1 x daily - 7 x weekly - 3 sets - 10 reps ASSESSMENT:  CLINICAL IMPRESSION: Added some band strengthening/stabilization today for her Rt shoulder with good overall tolerance noted. PT will continue to work to improve strength and ROM to improve functional use of Rt UE.   OBJECTIVE IMPAIRMENTS: decreased activity tolerance, decreased ROM, decreased strength, hypomobility, increased fascial restrictions, increased muscle spasms, impaired UE functional use, postural dysfunction, obesity,  and pain.   ACTIVITY LIMITATIONS: carrying, lifting, bathing, toileting, dressing, reach over head, and caring for others  PARTICIPATION LIMITATIONS: meal prep,  cleaning, laundry, driving, shopping, community activity, yard work, and church  PERSONAL FACTORS: Age, Behavior pattern, Fitness, Past/current experiences, Social background, and Time since onset of injury/illness/exacerbation are also affecting patient's functional outcome.   REHAB POTENTIAL: Fair chronicity of impairments   CLINICAL DECISION MAKING: Stable/uncomplicated  EVALUATION COMPLEXITY: Low   GOALS: Goals reviewed with patient? No  SHORT TERM GOALS: Target date: 03/09/2023    Will be compliant with appropriate progressive HEP  Baseline: Goal status: on going 02/16/2023  2.  R shoulder flexion and ABD AROM to be equal to that of left  Baseline:  Goal status: on going 02/16/2023  3.  R shoulder FER AROM to be equal to that of the left  Baseline:  Goal status: on going 02/16/2023  4.  Will demonstrate improved postural awareness and biomechanics to facilitate good GH and scapular mechanics  Baseline:  Goal status: on going 02/16/2023    LONG TERM GOALS: Target date: 04/06/2023    MMT to have improved by at least 1 grade in all weak groups  Baseline:  Goal status: INITIAL  2.  Pain R shoulder to be no more than 4/10 at worst  Baseline:  Goal status: INITIAL  3.  Will be able to perform all self and home care tasks without difficulty or increased pain R UE  Baseline:  Goal status: INITIAL  4.  Will be able to reach overhead without difficulty or increased pain R UE  Baseline:  Goal status: INITIAL  5.  FOTO score to be within 10 points of predicted by time of DC  Baseline:  Goal status: INITIAL    PLAN:  PT FREQUENCY: 1-2x/week  PT DURATION: 8 weeks  PLANNED INTERVENTIONS: 97164- PT Re-evaluation, 97110-Therapeutic exercises, 97530- Therapeutic activity, 97112- Neuromuscular re-education, 97535- Self Care, [id number]- Manual therapy, [id number]- Aquatic Therapy, 97014- Electrical stimulation (unattended), 97016- Vasopneumatic device, [id number]- Ionotophoresis  4mg /ml Dexamethasone , Taping, Dry Needling, Cryotherapy, and Moist heat  PLAN FOR NEXT SESSION:  Rt shoulder ROM and  early strengthening avoiding shrug.     Redell Moose, PT, DPT 03/12/23 11:40 AM

## 2023-03-19 ENCOUNTER — Ambulatory Visit: Payer: No Typology Code available for payment source | Admitting: Physical Therapy

## 2023-03-19 ENCOUNTER — Encounter: Payer: Self-pay | Admitting: Physical Therapy

## 2023-03-19 DIAGNOSIS — M25611 Stiffness of right shoulder, not elsewhere classified: Secondary | ICD-10-CM | POA: Diagnosis not present

## 2023-03-19 DIAGNOSIS — G8929 Other chronic pain: Secondary | ICD-10-CM

## 2023-03-19 DIAGNOSIS — M6281 Muscle weakness (generalized): Secondary | ICD-10-CM

## 2023-03-19 DIAGNOSIS — M25511 Pain in right shoulder: Secondary | ICD-10-CM | POA: Diagnosis not present

## 2023-03-19 DIAGNOSIS — R293 Abnormal posture: Secondary | ICD-10-CM | POA: Diagnosis not present

## 2023-03-19 NOTE — Therapy (Signed)
 OUTPATIENT PHYSICAL THERAPY TREATMENT   Patient Name: Margaret Gordon MRN: [id number] DOB:June 28, 1940, 83 y.o., female Today's Date: 03/19/2023  END OF SESSION:  PT End of Session - 03/19/23 1057     Visit Number 6    Number of Visits 17    Date for PT Re-Evaluation 04/06/23    Authorization Type Devoted $15    Authorization Time Period 02/09/23 to 04/06/23    PT Start Time 1059    PT Stop Time 1140    PT Time Calculation (min) 41 min    Activity Tolerance Patient tolerated treatment well;Patient limited by pain    Behavior During Therapy WFL for tasks assessed/performed                  Past Medical History:  Diagnosis Date   Anemia    hx   Arthritis    Asthma    GERD (gastroesophageal reflux disease)    History of chicken pox    History of glaucoma    History of hiatal hernia    Hyperlipidemia    Hypertension    Migraines    Pneumonia    hx   Scoliosis    Past Surgical History:  Procedure Laterality Date   LUMBAR LAMINECTOMY/DECOMPRESSION MICRODISCECTOMY Left 11/02/2016   Procedure: Left Lumbar Three-Four, Lumbar Four-Five Laminectomy and Foraminotomy;  Surgeon: Joshua Alm RAMAN, MD;  Location: [facility] OR;  Service: Neurosurgery;  Laterality: Left;   TONSILLECTOMY     VESICOVAGINAL FISTULA CLOSURE W/ TAH     Patient Active Problem List   Diagnosis Date Noted   Renal cyst 02/13/2023   Blood creatinine decreased compared with prior measurement 11/03/2022   Chronic right shoulder pain 11/03/2022   Periorbital cellulitis of right eye 06/28/2022   Peripheral edema 06/06/2022   Venous insufficiency 06/06/2022   History of vertigo 02/08/2022   Benign paroxysmal positional vertigo due to bilateral vestibular disorder 01/05/2022   Trochanteric bursitis of right hip 01/05/2022   Decreased GFR 05/10/2021   History of colonic polyps 04/07/2021   Thyroid  nodule 04/07/2021   GERD (gastroesophageal reflux disease) 04/07/2021   Chronic bilateral low back pain with  right-sided sciatica 04/07/2021   Scoliosis 04/07/2021   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   Osteoarthritis, 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   Hypertension 04/07/2021   High cholesterol 04/07/2021   History of anemia 04/07/2021   Body mass index (BMI) of 33.0 to 33.9 in adult 04/07/2021   Vitamin D  deficiency 04/07/2021   OA (osteoarthritis of spine) 01/16/2017   S/P lumbar laminectomy 11/02/2016   Abnormal CT scan, chest 04/01/2013   Upper airway cough syndrome 01/05/2013    PCP: Avelina No MD   REFERRING PROVIDER: Watt Mirza, MD  REFERRING DIAG: Diagnosis M25.511,G89.29 (ICD-10-CM) - 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 Sister is in the hospital right now, had to take her this morning.   Hand dominance: Right  PERTINENT HISTORY: See above   PAIN:  NPRS scale: 6/10 today Pain location: Rt shoulder  Pain description: kind of sharp depending on movements  Aggravating factors: reaching with RUE especially [place]  Relieving factors: shot from MD  PRECAUTIONS: None  RED FLAGS: None   WEIGHT BEARING RESTRICTIONS: No  FALLS:  Has patient fallen in last 6 months? 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  DIAGNOSTIC FINDINGS:  02/09/2023 review:   RIGHT SHOULDER - 2+ VIEW   IMPRESSION: Degenerative change and findings suspicious for rotator cuff injury.      PATIENT SURVEYS:  02/09/2023 FOTO 40, predicted 58 in 13 visits; 1/13- 59.5   COGNITION: 02/09/2023 Overall cognitive status: Within functional limits for tasks assessed     SENSATION: 02/09/2023 Not tested  POSTURE: 02/09/2023 Forward head, rounded shoulders, increased thoracic kyphosis   UPPER EXTREMITY MMT:   MMT Right 02/09/2023   Left 02/09/2023  Shoulder flexion 3+  4+  Shoulder extension    Shoulder abduction 3- 4+  Shoulder adduction    Shoulder internal rotation 5 4+  Shoulder external rotation 3 at best  4  Elbow flexion    Elbow extension    Wrist flexion    Wrist extension    Wrist ulnar deviation    Wrist radial deviation    Wrist pronation    Wrist supination    (Blank rows = not tested)  UPPER EXTREMITY ROM:  ROM Right 02/09/2023 AROM Left 02/09/2023 Right 02/16/2023 Right 03/02/23 AROM sitting  Left 03/19/23 Right 03/19/23  Shoulder flexion 100* UT compensations  140* 140 AROM in supine 145* UT compensation 143* 143*  Shoulder extension 95* UT compensations  110*  110* 90* 93*  Shoulder abduction        Shoulder adduction        Shoulder internal rotation FIR L4 FIR L4   FIR L3  FIR L3 FIR L3   Shoulder external rotation FER C6 FER suboccipitals   FER to upper traps at base of neck  FER to upper traps  FER to occiput   Middle trapezius        Lower trapezius         Elbow flexion        Elbow extension        Wrist flexion        Wrist extension        Wrist ulnar deviation        Wrist radial deviation        Wrist pronation        Wrist supination        Grip strength (lbs)        (Blank rows = not tested)  PALPATION:  02/09/2023 R anterior/middle/posterior delts and upper traps very tender to touch                    TODAY'S TREATMENT:      DATE:    03/19/23  TherEx   UBE L2 x4 minutes forward/4 minutes backwards (8 minutes total) ROM check- see above FOTO update Supine shoulder flexion stretching/AAROM x12 with 2 pound rod Chest press 2# bar x15 Standing rows red TB x15  Standing shoulder extensions red TB x15  Backwards shoulder rolls x20 Standing shoulder extensions red TB x15  Thoracic extension stretches x12 with UEs OH in extension stretch Standing shoulder ER red TB x12  B     03/11/22 TherEx UBE L2 x4 minutes forward/x4 minutes backwards (8 min total) Standing rows red 2X10 Standing shoulder extensions red 2X10 Standing Rt shoudler IR red 2X10 Seated (to rest her back/knee) Rt shoulder ER red 2X10 Supine shoulder flexion AAROM 15x2 second holds with 2# rod  Supine serratus punch 2# x10 R UE Supine ABD AAROM 15x2 second holds with 1# rod Supine serratus punch 2# R UE CW/CCW circles x10 each Supine shoulder flexion 1# 2x10 R UE  L stretch at counter for shoulder flexion ROM 4x10 seconds     03/02/23  TherEx  UBE L2 x4 minutes forward/x4 minutes backwards (8 min total) Updated ROM- see table above Supine shoulder flexion AAROM 15x2 second holds with 1# rod  Supine serratus punch 2# x10 R UE Supine ABD AAROM 15x2 second holds with 1# rod Supine serratus punch 2# R UE CW/CCW circles x10 each Supine shoulder flexion 1# 2x10 R UE  L stretch at counter for shoulder flexion ROM 3x10 seconds      02/21/23  TherEx  Scifit bike L2 x4 min forward/4 min backwards (8 min total) Supine flexion AAROM 1#  dowel 15x2 second holds Supine ABD AAROM 1# dowel 15x2 second holds Supine shoulder flexion 1# x8 Shoulder flexion isometric 12x3 seconds Shoulder ABD isometric 12x3 seconds  Shoulder ER isometrics 12x3 seconds       02/16/2023 Therex:  Supine Rt shoulder AROM flexion x 15 Sidelying Rt shoulder AROM abduction x 15 Sidelying Rt shoulder ER c towel under arm x 15  Standing green band rows 2 x 10  Standing green band gh ext 2 x 10   Manual  Rt shoulder g2 inferior mobs in elevation, Mobilization c movement posterior glide with ER passively    TODAY'S TREATMENT:      DATE: 02/09/23 exam findings, POC, HEP   UBE L2x3 min forward/3 min backwards for w/u and tissue perfusion Scap retractions 10x3 seconds Backwards shoulder rolls x20  Shoulder flexion stretch on door 10x5 seconds     PATIENT EDUCATION: 02/16/2023 Education details: HEP progression  Person educated: Patient Education method: Programmer, Multimedia, Facilities Manager, and Handouts Education comprehension: verbalized understanding, returned demonstration, and needs further education  HOME EXERCISE PROGRAM:  Access Code: [email or web address] URL: [email or web address] Date: 02/21/2023 Prepared by: Josette Rough  Exercises - Seated Scapular Retraction  - 1 x daily - 7 x weekly - 2 sets - 10 reps - 3 seconds  hold - Standing Single Arm Shoulder Flexion Stretch on Wall  - 2 x daily - 7 x weekly - 1 sets - 10 reps - 5 seconds  hold - Supine Shoulder Flexion Extension Full Range AROM  - 1-2 x daily - 7 x weekly - 1-2 sets - 10-15 reps - Sidelying Shoulder Abduction Palm Forward  - 1-2  x daily - 7 x weekly - 1-2 sets - 10-15 reps - Sidelying Shoulder External Rotation (Mirrored)  - 1-2 x daily - 7 x weekly - 1-2 sets - 10-15 reps - Standing Isometric Shoulder Flexion with Doorway  - 1 x daily - 7 x weekly - 3 sets - 10 reps - Standing Isometric Shoulder Abduction with Doorway  - 1 x daily - 7 x weekly - 3 sets - 10 reps -  Standing Isometric Shoulder External Rotation with Doorway  - 1 x daily - 7 x weekly - 3 sets - 10 reps ASSESSMENT:  CLINICAL IMPRESSION:  Pt arrives today doing OK, we updated ROM measures and FOTO today. She does seem to be making slow but steady progress with PT, she sees the MD soon, we will need to send a progress note prior to this appt.   OBJECTIVE IMPAIRMENTS: decreased activity tolerance, decreased ROM, decreased strength, hypomobility, increased fascial restrictions, increased muscle spasms, impaired UE functional use, postural dysfunction, obesity, and pain.   ACTIVITY LIMITATIONS: carrying, lifting, bathing, toileting, dressing, reach over head, and caring for others  PARTICIPATION LIMITATIONS: meal prep, cleaning, laundry, driving, shopping, community activity, yard work, and church  PERSONAL FACTORS: Age, Behavior pattern, Fitness, Past/current experiences, Social background, and Time since onset of injury/illness/exacerbation are also affecting patient's functional outcome.   REHAB POTENTIAL: Fair chronicity of impairments   CLINICAL DECISION MAKING: Stable/uncomplicated  EVALUATION COMPLEXITY: Low   GOALS: Goals reviewed with patient? No  SHORT TERM GOALS: Target date: 03/09/2023    Will be compliant with appropriate progressive HEP  Baseline: Goal status: on going 02/16/2023  2.  R shoulder flexion and ABD AROM to be equal to that of left  Baseline:  Goal status: on going 02/16/2023  3.  R shoulder FER AROM to be equal to that of the left  Baseline:  Goal status: on going 02/16/2023  4.  Will demonstrate improved postural awareness and biomechanics to facilitate good GH and scapular mechanics  Baseline:  Goal status: on going 02/16/2023    LONG TERM GOALS: Target date: 04/06/2023    MMT to have improved by at least 1 grade in all weak groups  Baseline:  Goal status: INITIAL  2.  Pain R shoulder to be no more than 4/10 at worst  Baseline:  Goal  status: INITIAL  3.  Will be able to perform all self and home care tasks without difficulty or increased pain R UE  Baseline:  Goal status: INITIAL  4.  Will be able to reach overhead without difficulty or increased pain R UE  Baseline:  Goal status: INITIAL  5.  FOTO score to be within 10 points of predicted by time of DC  Baseline:  Goal status: INITIAL    PLAN:  PT FREQUENCY: 1-2x/week  PT DURATION: 8 weeks  PLANNED INTERVENTIONS: 97164- PT Re-evaluation, 97110-Therapeutic exercises, 97530- Therapeutic activity, 97112- Neuromuscular re-education, 97535- Self Care, 02859- Manual therapy, [id number]- Aquatic Therapy, 97014- Electrical stimulation (unattended), 97016- Vasopneumatic device, [id number]- Ionotophoresis 4mg /ml Dexamethasone , Taping, Dry Needling, Cryotherapy, and Moist heat  PLAN FOR NEXT SESSION:  Rt shoulder ROM and  early strengthening avoiding shrug, caution with back pain in standing. PT progress note prior to MD appt 03/28/23   Josette Rough, PT, DPT 03/19/23 11:41 AM

## 2023-03-21 ENCOUNTER — Encounter: Payer: Self-pay | Admitting: Rehabilitative and Restorative Service Providers"

## 2023-03-21 ENCOUNTER — Ambulatory Visit: Payer: No Typology Code available for payment source | Admitting: Rehabilitative and Restorative Service Providers"

## 2023-03-21 DIAGNOSIS — M25611 Stiffness of right shoulder, not elsewhere classified: Secondary | ICD-10-CM

## 2023-03-21 DIAGNOSIS — M6281 Muscle weakness (generalized): Secondary | ICD-10-CM | POA: Diagnosis not present

## 2023-03-21 DIAGNOSIS — R293 Abnormal posture: Secondary | ICD-10-CM

## 2023-03-21 DIAGNOSIS — M25511 Pain in right shoulder: Secondary | ICD-10-CM | POA: Diagnosis not present

## 2023-03-21 DIAGNOSIS — G8929 Other chronic pain: Secondary | ICD-10-CM | POA: Diagnosis not present

## 2023-03-21 NOTE — Therapy (Signed)
 OUTPATIENT PHYSICAL THERAPY TREATMENT   Patient Name: Margaret Gordon MRN: [id number] DOB:December 15, 1940, 83 y.o., female Today's Date: 03/21/2023  END OF SESSION:  PT End of Session - 03/21/23 1027     Visit Number 7    Number of Visits 17    Date for PT Re-Evaluation 04/06/23    Authorization Type Devoted $15    Authorization Time Period 02/09/23 to 04/06/23    PT Start Time 1022    PT Stop Time 1100    PT Time Calculation (min) 38 min    Activity Tolerance Patient tolerated treatment well    Behavior During Therapy WFL for tasks assessed/performed                   Past Medical History:  Diagnosis Date   Anemia    hx   Arthritis    Asthma    GERD (gastroesophageal reflux disease)    History of chicken pox    History of glaucoma    History of hiatal hernia    Hyperlipidemia    Hypertension    Migraines    Pneumonia    hx   Scoliosis    Past Surgical History:  Procedure Laterality Date   LUMBAR LAMINECTOMY/DECOMPRESSION MICRODISCECTOMY Left 11/02/2016   Procedure: Left Lumbar Three-Four, Lumbar Four-Five Laminectomy and Foraminotomy;  Surgeon: Isadora Mar, MD;  Location: [facility] OR;  Service: Neurosurgery;  Laterality: Left;   TONSILLECTOMY     VESICOVAGINAL FISTULA CLOSURE W/ TAH     Patient Active Problem List   Diagnosis Date Noted   Renal cyst 02/13/2023   Blood creatinine decreased compared with prior measurement 11/03/2022   Chronic right shoulder pain 11/03/2022   Periorbital cellulitis of right eye 06/28/2022   Peripheral edema 06/06/2022   Venous insufficiency 06/06/2022   History of vertigo 02/08/2022   Benign paroxysmal positional vertigo due to bilateral vestibular disorder 01/05/2022   Trochanteric bursitis of right hip 01/05/2022   Decreased GFR 05/10/2021   History of colonic polyps 04/07/2021   Thyroid  nodule 04/07/2021   GERD (gastroesophageal reflux disease) 04/07/2021   Chronic bilateral low back pain with right-sided sciatica  04/07/2021   Scoliosis 04/07/2021   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   Osteoarthritis, 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   Hypertension 04/07/2021   High cholesterol 04/07/2021   History of anemia 04/07/2021   Body mass index (BMI) of 33.0 to 33.9 in adult 04/07/2021   Vitamin D  deficiency 04/07/2021   OA (osteoarthritis of spine) 01/16/2017   S/P lumbar laminectomy 11/02/2016   Abnormal CT scan, chest 04/01/2013   Upper airway cough syndrome 01/05/2013    PCP: Herby Lolling MD   REFERRING PROVIDER: Scherrie Curt, MD  REFERRING DIAG: Diagnosis M25.511,G89.29 (ICD-10-CM) - Chronic right shoulder pain M75.01 (ICD-10-CM) - Adhesive capsulitis of right shoulder  THERAPY DIAG:  Stiffness of right shoulder, not elsewhere classified  Chronic right shoulder pain  Muscle weakness (generalized)  Abnormal posture  Rationale for Evaluation and Treatment: Rehabilitation  ONSET DATE:  "quite awhile"/chronic   SUBJECTIVE:  SUBJECTIVE STATEMENT: Pt indicated having less severity of symptoms compared to previous in Rt shoulder.  Pt indicated having back complaints today more.   Hand dominance: Right  PERTINENT HISTORY: See above   PAIN:  NPRS scale: upon arrival 5/10 Pain location: Rt shoulder  Pain description: kind of sharp depending on movements  Aggravating factors: reaching with RUE especially [place]  Relieving factors: shot from MD  PRECAUTIONS: None  RED FLAGS: None   WEIGHT BEARING RESTRICTIONS: No  FALLS:  Has patient fallen in last 6 months? 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  DIAGNOSTIC FINDINGS:  02/09/2023 review:   RIGHT SHOULDER - 2+ VIEW   IMPRESSION: Degenerative change and findings suspicious for rotator cuff injury.    PATIENT SURVEYS:  03/19/2023: FOTO update 60  02/09/2023 FOTO 40, predicted 58 in 13 visits; 1/13- 59.5   COGNITION: 02/09/2023 Overall cognitive status: Within functional limits for tasks assessed     SENSATION: 02/09/2023 Not tested  POSTURE: 02/09/2023 Forward head, rounded shoulders, increased thoracic kyphosis   UPPER EXTREMITY MMT:   MMT Right 02/09/2023   Left 02/09/2023  Shoulder flexion 3+  4+  Shoulder extension    Shoulder abduction 3- 4+  Shoulder adduction    Shoulder internal rotation 5 4+  Shoulder external rotation 3 at best  4  Elbow flexion    Elbow extension    Wrist flexion    Wrist extension    Wrist ulnar deviation    Wrist radial deviation    Wrist pronation    Wrist supination    (Blank rows = not tested)  UPPER EXTREMITY ROM:  ROM Right 02/09/2023 AROM Left 02/09/2023 Right 02/16/2023 Right 03/02/23 AROM sitting  Left 03/19/23 Right 03/19/23  Shoulder flexion 100* UT compensations  140* 140 AROM in supine 145* UT compensation 143* 143*  Shoulder extension 95* UT compensations  110*  110* 90* 93*  Shoulder abduction        Shoulder adduction        Shoulder internal rotation FIR L4 FIR L4   FIR L3  FIR L3 FIR L3   Shoulder external rotation FER C6 FER suboccipitals   FER to upper traps at base of neck  FER to upper traps  FER to occiput   Middle trapezius        Lower trapezius        Elbow flexion        Elbow extension         Wrist flexion        Wrist extension        Wrist ulnar deviation        Wrist radial deviation        Wrist pronation        Wrist supination        Grip strength (lbs)        (Blank rows = not tested)  PALPATION:  02/09/2023 Rt anterior/middle/posterior delts and upper traps very tender to touch  TODAY'S TREATMENT:      DATE: 03/21/2023 TherEx UBE L2 x4 minutes forward/4 minutes backwards (8 minutes total) with 30 second rest between Seated green band rows bilateral 2 x 15 Seated red band ER c towel under arm 2 x 10, performed bilaterally Supine 2 lb wand flexion 2 x 10 bilateral AAROM Chest press 2# bar x15 with SA press 2-3 sec hold Seated scapular retraction 2-3 sec hold x 15  Additional time to perform good slow control movement focus.    TODAY'S TREATMENT:      DATE: 03/19/23 TherEx UBE L2 x4 minutes forward/4 minutes backwards (8 minutes total) ROM check- see above FOTO update Supine shoulder flexion stretching/AAROM x12 with 2 pound rod Chest press 2# bar x15 Standing rows red TB x15  Standing shoulder extensions red TB x15  Backwards shoulder rolls x20 Standing shoulder extensions red TB x15  Thoracic extension stretches x12 with UEs [place] in extension stretch Standing shoulder ER red TB x12  B  TODAY'S TREATMENT:      DATE: 03/11/22 TherEx UBE L2 x4 minutes forward/x4 minutes backwards (8 min total) Standing rows red 2X10 Standing shoulder extensions red 2X10 Standing Rt shoudler IR red 2X10 Seated (to rest her back/knee) Rt shoulder ER red 2X10 Supine shoulder flexion AAROM 15x2 second holds with 2# rod  Supine serratus punch 2# x10 R UE Supine ABD AAROM 15x2 second holds with 1# rod Supine serratus punch 2# R UE CW/CCW circles x10 each Supine shoulder flexion 1# 2x10 R UE  L stretch at counter for shoulder flexion ROM 4x10 seconds   PATIENT EDUCATION: 02/16/2023 Education details: HEP progression  Person educated:  Patient Education method: Programmer, multimedia, Facilities manager, and Handouts Education comprehension: verbalized understanding, returned demonstration, and needs further education  HOME EXERCISE PROGRAM:  Access Code: [email or web address] URL: [email or web address] Date: 02/21/2023 Prepared by: Terrel Ferries  Exercises - Seated Scapular Retraction  - 1 x daily - 7 x weekly - 2 sets - 10 reps - 3 seconds  hold - Standing Single Arm Shoulder Flexion Stretch on Wall  - 2 x daily - 7 x weekly - 1 sets - 10 reps - 5 seconds  hold - Supine Shoulder Flexion Extension Full Range AROM  - 1-2 x daily - 7 x weekly - 1-2 sets - 10-15 reps - Sidelying Shoulder Abduction Palm Forward  - 1-2 x daily - 7 x weekly - 1-2 sets - 10-15 reps - Sidelying Shoulder External Rotation (Mirrored)  - 1-2 x daily - 7 x weekly - 1-2 sets - 10-15 reps - Standing Isometric Shoulder Flexion with Doorway  - 1 x daily - 7 x weekly - 3 sets - 10 reps - Standing Isometric Shoulder Abduction with Doorway  - 1 x daily - 7 x weekly - 3 sets - 10 reps - Standing Isometric Shoulder External Rotation with Doorway  - 1 x daily - 7 x weekly - 3 sets - 10 reps ASSESSMENT:  CLINICAL IMPRESSION: Generally reporting some reduced symptoms in shoulder to this point.  Continued focus on progressive shoulder and scapular strengthening to improve function reach.  Plan for progress note for MD visit next visit.   OBJECTIVE IMPAIRMENTS: decreased activity tolerance, decreased ROM, decreased strength, hypomobility, increased fascial restrictions, increased muscle spasms, impaired UE functional use, postural dysfunction, obesity, and pain.   ACTIVITY LIMITATIONS: carrying, lifting, bathing, toileting, dressing, reach over head, and caring for others  PARTICIPATION LIMITATIONS: meal prep, cleaning, laundry, driving, shopping, community activity, yard work, and church  PERSONAL FACTORS: Age, Behavior pattern, Fitness, Past/current experiences, Social  background, and Time since onset of injury/illness/exacerbation are also affecting patient's functional outcome.   REHAB POTENTIAL: Fair chronicity of impairments   CLINICAL DECISION MAKING: Stable/uncomplicated  EVALUATION COMPLEXITY: Low   GOALS: Goals reviewed with patient? No  SHORT TERM GOALS: Target date: 03/09/2023    Will be compliant with appropriate progressive HEP  Baseline: Goal status: met  2.  R shoulder flexion and ABD AROM to be equal to that of left  Baseline:  Goal status:partially met  3.  R shoulder FER AROM to be equal to that of the left  Baseline:  Goal status: partially met  4.  Will demonstrate improved postural awareness and biomechanics to facilitate good GH and scapular mechanics  Baseline:  Goal status:partially met    LONG TERM GOALS: Target date: 04/06/2023    MMT to have improved by at least 1 grade in all weak groups  Baseline:  Goal status: INITIAL  2.  Pain R shoulder to be no more than 4/10 at worst  Baseline:  Goal status: INITIAL  3.  Will be able to perform all self and home care tasks without difficulty or increased pain R UE  Baseline:  Goal status: INITIAL  4.  Will be able to reach overhead without difficulty or increased pain R UE  Baseline:  Goal status: INITIAL  5.  FOTO score to be within 10 points of predicted by time of DC  Baseline:  Goal status: INITIAL    PLAN:  PT FREQUENCY: 1-2x/week  PT DURATION: 8 weeks  PLANNED INTERVENTIONS: 97164- PT Re-evaluation, 97110-Therapeutic exercises, 97530- Therapeutic activity, [id number]- Neuromuscular re-education, 97535- Self Care, 96295- Manual therapy, [id number]- Aquatic Therapy, 97014- Electrical stimulation (unattended), 97016- Vasopneumatic device, [id number]- Ionotophoresis 4mg /ml Dexamethasone , Taping, Dry Needling, Cryotherapy, and Moist heat  PLAN FOR NEXT SESSION: MD progress note for visit on Jan 22. LTG reassessment.     Bonna Bustard, PT, DPT, OCS, ATC 03/21/23   10:56 AM

## 2023-03-22 DIAGNOSIS — Z6833 Body mass index (BMI) 33.0-33.9, adult: Secondary | ICD-10-CM | POA: Diagnosis not present

## 2023-03-22 DIAGNOSIS — N289 Disorder of kidney and ureter, unspecified: Secondary | ICD-10-CM | POA: Diagnosis not present

## 2023-03-23 ENCOUNTER — Other Ambulatory Visit: Payer: Self-pay | Admitting: Family Medicine

## 2023-03-23 ENCOUNTER — Other Ambulatory Visit: Payer: Self-pay

## 2023-03-23 MED ORDER — GABAPENTIN 300 MG PO CAPS
300.0000 mg | ORAL_CAPSULE | Freq: Two times a day (BID) | ORAL | 1 refills | Status: DC
  Filled 2023-03-23: qty 180, 90d supply, fill #0

## 2023-03-23 MED ORDER — TRAMADOL HCL 50 MG PO TABS: 50.0000 mg | ORAL_TABLET | Freq: Four times a day (QID) | ORAL | 0 refills | Status: DC | PRN

## 2023-03-23 NOTE — Telephone Encounter (Signed)
Last office visit 02/13/2023 for CPE. Last refilled:  Listed as historical on med list.  Next Appt: 03/28/23 with Dr. Patsy Lager to follow up frozen shoulder.

## 2023-03-23 NOTE — Telephone Encounter (Signed)
Last office visit 02/13/2023 for CPE. Last refilled:  02/26/2023 for #30 with no refills.   Next Appt: 03/28/23 with Dr. Patsy Lager to follow up frozen shoulder.

## 2023-03-27 ENCOUNTER — Other Ambulatory Visit (HOSPITAL_COMMUNITY): Payer: Self-pay | Admitting: Neurological Surgery

## 2023-03-27 ENCOUNTER — Ambulatory Visit: Payer: No Typology Code available for payment source | Admitting: Physical Therapy

## 2023-03-27 ENCOUNTER — Encounter: Payer: Self-pay | Admitting: Physical Therapy

## 2023-03-27 DIAGNOSIS — M25511 Pain in right shoulder: Secondary | ICD-10-CM

## 2023-03-27 DIAGNOSIS — M25611 Stiffness of right shoulder, not elsewhere classified: Secondary | ICD-10-CM | POA: Diagnosis not present

## 2023-03-27 DIAGNOSIS — M6281 Muscle weakness (generalized): Secondary | ICD-10-CM | POA: Diagnosis not present

## 2023-03-27 DIAGNOSIS — R293 Abnormal posture: Secondary | ICD-10-CM

## 2023-03-27 DIAGNOSIS — G8929 Other chronic pain: Secondary | ICD-10-CM

## 2023-03-27 DIAGNOSIS — N289 Disorder of kidney and ureter, unspecified: Secondary | ICD-10-CM

## 2023-03-27 NOTE — Progress Notes (Deleted)
   Fara Worthy T. Nathin Saran, MD, CAQ Sports Medicine [facility] at [facility] [address] [place], [address]  Phone: [phone]  FAX: [phone]  Margaret Gordon - 83 y.o. female  MRN [id number]  Date of Birth: 12-05-1940  Date: 03/28/2023  PCP: Excell Seltzer, MD  Referral: Excell Seltzer, MD  No chief complaint on file.  Subjective:   Margaret Gordon is a 83 y.o. very pleasant female patient with There is no height or weight on file to calculate BMI. who presents with the following:  Patient is here for follow-up of frozen shoulder.  I last saw her on January 22, 2023.  I just received communication from physical therapy today, and they felt as if she was making some good progress with her range of motion and strength.  Did feel like she could use some additional work on strength in the future, PT versus HEP.  Radiographically, she does have a high riding humeral head which can be associated with rotator cuff arthropathy.    Review of Systems is noted in the HPI, as appropriate  Objective:   There were no vitals taken for this visit.  GEN: No acute distress; alert,appropriate. PULM: Breathing comfortably in no respiratory distress PSYCH: Normally interactive.   Laboratory and Imaging Data:  Assessment and Plan:   ***

## 2023-03-27 NOTE — Therapy (Signed)
OUTPATIENT PHYSICAL THERAPY TREATMENT/PROGRESS NOTE Progress Note reporting period 02/09/23 to 03/27/23  See below for objective and subjective measurements relating to patients progress with PT.    Patient Name: Margaret Gordon MRN: [id number] DOB:Oct 23, 1940, 83 y.o., female Today's Date: 03/27/2023  END OF SESSION:  PT End of Session - 03/27/23 1334     Visit Number 8    Number of Visits 17    Date for PT Re-Evaluation 04/06/23    Authorization Type [employer] $15    Authorization Time Period 02/09/23 to 04/06/23    Progress Note Due on Visit 17    PT Start Time 1300    PT Stop Time 1338    PT Time Calculation (min) 38 min    Activity Tolerance Patient tolerated treatment well    Behavior During Therapy WFL for tasks assessed/performed                    Past Medical History:  Diagnosis Date   Anemia    hx   Arthritis    Asthma    GERD (gastroesophageal reflux disease)    History of chicken pox    History of glaucoma    History of hiatal hernia    Hyperlipidemia    Hypertension    Migraines    Pneumonia    hx   Scoliosis    Past Surgical History:  Procedure Laterality Date   LUMBAR LAMINECTOMY/DECOMPRESSION MICRODISCECTOMY Left 11/02/2016   Procedure: Left Lumbar Three-Four, Lumbar Four-Five Laminectomy and Foraminotomy;  Surgeon: Tia Alert, MD;  Location: [facility];  Service: Neurosurgery;  Laterality: Left;   TONSILLECTOMY     VESICOVAGINAL FISTULA CLOSURE W/ TAH     Patient Active Problem List   Diagnosis Date Noted   Renal cyst 02/13/2023   Blood creatinine decreased compared with prior measurement 11/03/2022   Chronic right shoulder pain 11/03/2022   Periorbital cellulitis of right eye 06/28/2022   Peripheral edema 06/06/2022   Venous insufficiency 06/06/2022   History of vertigo 02/08/2022   Benign paroxysmal positional vertigo due to bilateral vestibular disorder 01/05/2022   Trochanteric bursitis of right hip 01/05/2022   Decreased GFR  05/10/2021   History of colonic polyps 04/07/2021   Thyroid nodule 04/07/2021   GERD (gastroesophageal reflux disease) 04/07/2021   Chronic bilateral low back pain with right-sided sciatica 04/07/2021   Scoliosis 04/07/2021   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   Osteoarthritis, 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   Hypertension 04/07/2021   High cholesterol 04/07/2021   History of anemia 04/07/2021   Body mass index (BMI) of 33.0 to 33.9 in adult 04/07/2021   Vitamin D deficiency 04/07/2021   OA (osteoarthritis of spine) 01/16/2017   S/P lumbar laminectomy 11/02/2016   Abnormal CT scan, chest 04/01/2013   Upper airway cough syndrome 01/05/2013    PCP: Kerby Nora MD   REFERRING PROVIDER: Hannah Beat, MD  REFERRING DIAG: Diagnosis M25.511,G89.29 (ICD-10-CM) - Chronic right shoulder pain 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   Hand dominance: Right  PERTINENT HISTORY: See above   PAIN:  NPRS scale: upon arrival 4/10 Pain location: Rt shoulder  Pain description: kind of sharp depending on movements  Aggravating factors: reaching with RUE especially [place]  Relieving factors: shot from MD  PRECAUTIONS: None  RED FLAGS: None   WEIGHT BEARING RESTRICTIONS: No  FALLS:  Has patient fallen in last 6 months? No  LIVING ENVIRONMENT: Lives with: lives with their spouse Lives in: House/apartment Stairs: one level/no steps Has following equipment at home:  Single point cane, Environmental consultant - 4 wheeled, Wheelchair (manual), and shower chair  OCCUPATION: Retired- used to work in [employer] and [employer]   PLOF: Independent, Independent with basic ADLs, Independent with gait, and Independent with transfers  PATIENT GOALS:  feel better, get use of shoulder back in general  NEXT MD VISIT: Referring January 22nd  OBJECTIVE:  Note: Objective measures were completed at Evaluation unless otherwise noted.  DIAGNOSTIC FINDINGS:  02/09/2023 review:   RIGHT SHOULDER - 2+ VIEW   IMPRESSION: Degenerative change and findings suspicious for rotator cuff injury.    PATIENT SURVEYS:  03/19/2023: FOTO update 60  02/09/2023 FOTO 40, predicted 58 in 13 visits; 1/13- 59.5   COGNITION: 02/09/2023 Overall cognitive status: Within functional limits for tasks assessed     SENSATION: 02/09/2023 Not tested  POSTURE: 02/09/2023 Forward head, rounded shoulders, increased thoracic kyphosis   UPPER EXTREMITY MMT:   MMT Right 02/09/2023   Left 02/09/2023 Right 03/27/23  Shoulder flexion 3+  4+ 4  Shoulder extension     Shoulder abduction 3- 4+ 4  Shoulder adduction     Shoulder internal rotation 5 4+ 5  Shoulder external rotation 3 at best  4 4  Elbow flexion     Elbow extension     Wrist flexion     Wrist extension     Wrist ulnar deviation     Wrist radial deviation     Wrist pronation     Wrist supination     (Blank rows = not tested)  UPPER EXTREMITY ROM:  ROM Right 02/09/2023 AROM Left 02/09/2023 Right 02/16/2023 Right 03/02/23 AROM sitting  Left 03/19/23 Right 03/19/23 Right 03/27/23  Shoulder flexion 100* UT compensations  140* 140 AROM in supine 145* UT compensation 143* 143* 145  Shoulder extension 95* UT compensations  110*  110* 90* 93* 100  Shoulder abduction       100  Shoulder adduction         Shoulder internal rotation FIR L4 FIR L4   FIR L3  FIR L3 FIR L3    Shoulder external rotation FER C6 FER suboccipitals    FER to upper traps at base of neck  FER to upper traps  FER to occiput    Middle trapezius         Lower trapezius         Elbow flexion         Elbow extension         Wrist flexion         Wrist extension         Wrist ulnar deviation         Wrist radial deviation         Wrist pronation         Wrist supination         Grip strength (lbs)         (  Blank rows = not tested)  PALPATION:  02/09/2023 Rt anterior/middle/posterior delts and upper traps very tender to touch                    TODAY'S TREATMENT:      DATE:   TODAY'S TREATMENT:      DATE: 03/27/23 TherEx Updated measurments, see above UBE L2 x3 minutes forward/3 minutes backwards (8 minutes total) Seated shoulder flexion AAROM with cane 2X10 Rows green 2X15, one set standing, then other set seated due to back pain Seated shoulder extensions green 2X10 Seated chest press with cane 2X10 Standing shoulder bilat ER red reps of 10, 5, 5 Seated horizontal abduction-adduction 4X5 Seated yellow ball circles in flexion plane at 90 deg X 10 CW,CCW Seated pendulums  X15 CW,CCW  03/21/2023 TherEx UBE L2 x4 minutes forward/4 minutes backwards (8 minutes total) with 30 second rest between Seated green band rows bilateral 2 x 15 Seated red band ER c towel under arm 2 x 10, performed bilaterally Supine 2 lb wand flexion 2 x 10 bilateral AAROM Chest press 2# bar x15 with SA press 2-3 sec hold Seated scapular retraction 2-3 sec hold x 15  Additional time to perform good slow control movement focus.    PATIENT EDUCATION: 02/16/2023 Education details: HEP progression  Person educated: Patient Education method: Programmer, multimedia, Demonstration, and Handouts Education comprehension: verbalized understanding, returned demonstration, and needs further education  HOME EXERCISE PROGRAM:  Access Code: [email or web address] URL: [email or web address] Date: 02/21/2023 Prepared by: Nedra Hai  Exercises - Seated Scapular  Retraction  - 1 x daily - 7 x weekly - 2 sets - 10 reps - 3 seconds  hold - Standing Single Arm Shoulder Flexion Stretch on Wall  - 2 x daily - 7 x weekly - 1 sets - 10 reps - 5 seconds  hold - Supine Shoulder Flexion Extension Full Range AROM  - 1-2 x daily - 7 x weekly - 1-2 sets - 10-15 reps - Sidelying Shoulder Abduction Palm Forward  - 1-2 x daily - 7 x weekly - 1-2 sets - 10-15 reps - Sidelying Shoulder External Rotation (Mirrored)  - 1-2 x daily - 7 x weekly - 1-2 sets - 10-15 reps - Standing Isometric Shoulder Flexion with Doorway  - 1 x daily - 7 x weekly - 3 sets - 10 reps - Standing Isometric Shoulder Abduction with Doorway  - 1 x daily - 7 x weekly - 3 sets - 10 reps - Standing Isometric Shoulder External Rotation with Doorway  - 1 x daily - 7 x weekly - 3 sets - 10 reps ASSESSMENT:  CLINICAL IMPRESSION: She has made overall progress with PT in pain, strength, and AROM available. She does still have some limitations with Rt shoulder weakness compared to left and would benefit from further strengthening. I will let patient decide if she wants to continue to come to PT for this or do this independently with HEP. She will decide once she sees doctor again tomorrow after their opinion.   OBJECTIVE IMPAIRMENTS: decreased activity tolerance, decreased ROM, decreased strength, hypomobility, increased fascial restrictions, increased muscle spasms, impaired UE functional use, postural dysfunction, obesity, and pain.   ACTIVITY LIMITATIONS: carrying, lifting, bathing, toileting, dressing, reach over head, and caring for others  PARTICIPATION LIMITATIONS: meal prep, cleaning, laundry, driving, shopping, community activity, yard work, and church  PERSONAL FACTORS: Age, Behavior pattern, Fitness, Past/current experiences, Social background, and Time since onset of injury/illness/exacerbation are also affecting patient's functional outcome.  REHAB POTENTIAL: Fair chronicity of impairments    CLINICAL DECISION MAKING: Stable/uncomplicated  EVALUATION COMPLEXITY: Low   GOALS: Goals reviewed with patient? No  SHORT TERM GOALS: Target date: 03/09/2023    Will be compliant with appropriate progressive HEP  Baseline: Goal status: met  2.  R shoulder flexion and ABD AROM to be equal to that of left  Baseline:  Goal status:partially met  3.  R shoulder FER AROM to be equal to that of the left  Baseline:  Goal status: partially met  4.  Will demonstrate improved postural awareness and biomechanics to facilitate good GH and scapular mechanics  Baseline:  Goal status:partially met    LONG TERM GOALS: Target date: 04/06/2023    MMT to have improved by at least 1 grade in all weak groups  Baseline:  Goal status: ongoing 03/27/23, still with weakness in flexion, abduction,ER  2.  Pain R shoulder to be no more than 4/10 at worst  Baseline:  Goal status: MET 03/27/23  3.  Will be able to perform all self and home care tasks without difficulty or increased pain R UE  Baseline:  Goal status: she reports can do what she needs to. MET 03/27/23  4.  Will be able to reach overhead without difficulty or increased pain R UE  Baseline:  Goal status: some difficulty still with this, ongoing 03/27/23  5.  FOTO score to be within 10 points of predicted by time of DC  Baseline:  Goal status: MET 03/27/23    PLAN:  PT FREQUENCY: 1-2x/week  PT DURATION: 8 weeks  PLANNED INTERVENTIONS: 97164- PT Re-evaluation, 97110-Therapeutic exercises, 97530- Therapeutic activity, 97112- Neuromuscular re-education, 97535- Self Care, 47829- Manual therapy, [id number]- Aquatic Therapy, 97014- Electrical stimulation (unattended), 97016- Vasopneumatic device, 97033- Ionotophoresis 4mg /ml Dexamethasone, Taping, Dry Needling, Cryotherapy, and Moist heat  PLAN FOR NEXT SESSION: what did MD say, does she ready to finish up with PT as last scheduled visit   Ivery Quale, PT, DPT 03/27/23 1:36  PM

## 2023-03-28 ENCOUNTER — Ambulatory Visit: Payer: No Typology Code available for payment source | Admitting: Family Medicine

## 2023-03-28 DIAGNOSIS — M7501 Adhesive capsulitis of right shoulder: Secondary | ICD-10-CM

## 2023-03-28 DIAGNOSIS — M19012 Primary osteoarthritis, left shoulder: Secondary | ICD-10-CM

## 2023-03-30 ENCOUNTER — Other Ambulatory Visit: Payer: Self-pay

## 2023-04-01 NOTE — Progress Notes (Unsigned)
   Armend Hochstatter T. Mayeli Bornhorst, MD, CAQ Sports Medicine [facility] at [facility] [address] [place], [address]  Phone: [phone]  FAX: [phone]  Margaret Gordon - 83 y.o. female  MRN [id number]  Date of Birth: 12/30/1940  Date: 04/02/2023  PCP: Excell Seltzer, MD  Referral: Excell Seltzer, MD  No chief complaint on file.  Subjective:   Margaret Gordon is a 83 y.o. very pleasant female patient with There is no height or weight on file to calculate BMI. who presents with the following:  Patient is here for follow-up of frozen shoulder.  I last saw her on January 22, 2023.  I just received communication from physical therapy today, and they felt as if she was making some good progress with her range of motion and strength.  Did feel like she could use some additional work on strength in the future, PT versus HEP.  Radiographically, she does have a high riding humeral head which can be associated with rotator cuff arthropathy.    Review of Systems is noted in the HPI, as appropriate  Objective:   There were no vitals taken for this visit.  GEN: No acute distress; alert,appropriate. PULM: Breathing comfortably in no respiratory distress PSYCH: Normally interactive.   Laboratory and Imaging Data:  Assessment and Plan:   ***

## 2023-04-02 ENCOUNTER — Encounter: Payer: Self-pay | Admitting: Physical Therapy

## 2023-04-02 ENCOUNTER — Ambulatory Visit: Payer: No Typology Code available for payment source | Admitting: Physical Therapy

## 2023-04-02 ENCOUNTER — Ambulatory Visit: Payer: No Typology Code available for payment source | Admitting: Family Medicine

## 2023-04-02 ENCOUNTER — Encounter: Payer: Self-pay | Admitting: Family Medicine

## 2023-04-02 ENCOUNTER — Ambulatory Visit (INDEPENDENT_AMBULATORY_CARE_PROVIDER_SITE_OTHER): Payer: No Typology Code available for payment source | Admitting: Family Medicine

## 2023-04-02 VITALS — BP 112/72 | HR 85 | Temp 98.1°F | Ht 66.75 in | Wt 234.4 lb

## 2023-04-02 DIAGNOSIS — R293 Abnormal posture: Secondary | ICD-10-CM

## 2023-04-02 DIAGNOSIS — M25611 Stiffness of right shoulder, not elsewhere classified: Secondary | ICD-10-CM

## 2023-04-02 DIAGNOSIS — G8929 Other chronic pain: Secondary | ICD-10-CM

## 2023-04-02 DIAGNOSIS — M6281 Muscle weakness (generalized): Secondary | ICD-10-CM | POA: Diagnosis not present

## 2023-04-02 DIAGNOSIS — M7501 Adhesive capsulitis of right shoulder: Secondary | ICD-10-CM

## 2023-04-02 DIAGNOSIS — M25511 Pain in right shoulder: Secondary | ICD-10-CM | POA: Diagnosis not present

## 2023-04-02 NOTE — Therapy (Signed)
OUTPATIENT PHYSICAL THERAPY TREATMENT   Patient Name: Margaret Gordon MRN: [id number] DOB:[birth year], 83 y.o., female Today's Date: 04/02/2023  END OF SESSION:  PT End of Session - 04/02/23 1100     Visit Number 9    Number of Visits 17    Date for PT Re-Evaluation 04/06/23    Authorization Type [employer] $15    Authorization Time Period 02/09/23 to 04/06/23    Progress Note Due on Visit 17    PT Start Time 1107    PT Stop Time 1145    PT Time Calculation (min) 38 min    Activity Tolerance Patient tolerated treatment well    Behavior During Therapy WFL for tasks assessed/performed                    Past Medical History:  Diagnosis Date   Anemia    hx   Arthritis    Asthma    GERD (gastroesophageal reflux disease)    History of chicken pox    History of glaucoma    History of hiatal hernia    Hyperlipidemia    Hypertension    Migraines    Pneumonia    hx   Scoliosis    Past Surgical History:  Procedure Laterality Date   LUMBAR LAMINECTOMY/DECOMPRESSION MICRODISCECTOMY Left 11/02/2016   Procedure: Left Lumbar Three-Four, Lumbar Four-Five Laminectomy and Foraminotomy;  Surgeon: Tia Alert, MD;  Location: [facility] OR;  Service: Neurosurgery;  Laterality: Left;   TONSILLECTOMY     VESICOVAGINAL FISTULA CLOSURE W/ TAH     Patient Active Problem List   Diagnosis Date Noted   Renal cyst 02/13/2023   Blood creatinine decreased compared with prior measurement 11/03/2022   Chronic right shoulder pain 11/03/2022   Periorbital cellulitis of right eye 06/28/2022   Peripheral edema 06/06/2022   Venous insufficiency 06/06/2022   History of vertigo 02/08/2022   Benign paroxysmal positional vertigo due to bilateral vestibular disorder 01/05/2022   Trochanteric bursitis of right hip 01/05/2022   Decreased GFR 05/10/2021   History of colonic polyps 04/07/2021   Thyroid nodule 04/07/2021   GERD (gastroesophageal reflux disease) 04/07/2021   Chronic bilateral low back  pain with right-sided sciatica 04/07/2021   Scoliosis 04/07/2021   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   Osteoarthritis, 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   Hypertension 04/07/2021   High cholesterol 04/07/2021   History of anemia 04/07/2021   Body mass index (BMI) of 33.0 to 33.9 in adult 04/07/2021   Vitamin D deficiency 04/07/2021   OA (osteoarthritis of spine) 01/16/2017   S/P lumbar laminectomy 11/02/2016   Abnormal CT scan, chest 04/01/2013   Upper airway cough syndrome 01/05/2013    PCP: Kerby Nora MD   REFERRING PROVIDER: Hannah Beat, MD  REFERRING DIAG: Diagnosis M25.511,G89.29 (ICD-10-CM) - Chronic right shoulder pain 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 She feels ready to transition to independent program for now.  Hand dominance: Right  PERTINENT HISTORY: See above   PAIN:  NPRS scale: upon arrival 2/10 Pain location: Rt shoulder  Pain description: kind of sharp depending on movements  Aggravating factors: reaching with RUE especially [place]  Relieving factors: shot from MD  PRECAUTIONS: None  RED FLAGS: None   WEIGHT BEARING RESTRICTIONS: No  FALLS:  Has patient fallen in last 6 months? 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  DIAGNOSTIC FINDINGS:  02/09/2023 review:   RIGHT SHOULDER - 2+ VIEW   IMPRESSION: Degenerative change and findings suspicious for rotator cuff injury.    PATIENT SURVEYS:  04/02/23: FOTO 66%, has met goal 03/19/2023: FOTO update 60 02/09/2023 FOTO 40, predicted 58 in 13 visits; 1/13- 59.5   COGNITION: 02/09/2023 Overall cognitive status: Within functional limits for tasks assessed     SENSATION: 02/09/2023 Not tested  POSTURE: 02/09/2023 Forward head, rounded shoulders, increased thoracic kyphosis   UPPER EXTREMITY MMT:   MMT Right 02/09/2023   Left 02/09/2023 Right 03/27/23  Shoulder flexion 3+  4+ 4  Shoulder extension     Shoulder abduction 3- 4+ 4  Shoulder adduction     Shoulder internal rotation 5 4+ 5  Shoulder external rotation 3 at best  4 4  Elbow flexion     Elbow extension     Wrist flexion     Wrist extension     Wrist ulnar deviation     Wrist radial deviation     Wrist pronation     Wrist supination     (Blank rows = not tested)  UPPER EXTREMITY ROM:  ROM Right 02/09/2023 AROM Left 02/09/2023 Right 02/16/2023 Right 03/02/23 AROM sitting  Left 03/19/23 Right 03/19/23 Right 03/27/23 Right 04/02/23  Shoulder flexion 100* UT compensations  140* 140 AROM in supine 145* UT compensation 143* 143* 145 150  Shoulder extension 95* UT compensations  110*  110* 90* 93* 100 110  Shoulder abduction       100   Shoulder adduction          Shoulder internal rotation FIR L4 FIR L4   FIR L3  FIR L3 FIR L3     Shoulder external rotation FER C6 FER  suboccipitals   FER to upper traps at base of neck  FER to upper traps  FER to occiput     Middle trapezius          Lower trapezius          Elbow flexion          Elbow extension          Wrist flexion          Wrist extension          Wrist ulnar deviation          Wrist  radial deviation          Wrist pronation          Wrist supination          Grip strength (lbs)          (Blank rows = not tested)  PALPATION:  02/09/2023 Rt anterior/middle/posterior delts and upper traps very tender to touch                    TODAY'S TREATMENT:       DATE: 04/02/23 TherEx UBE L2 x3 minutes forward/3 minutes backwards (8 minutes total) Seated shoulder flexion AAROM with cane 2X10 Seated shoulder abduction AAROM with cane 2X10 Rows green 2X15, one set standing, then other set seated due to back pain Seated shoulder extensions green 2X10 Seated chest press with cane 2X10 Standing shoulder bilat ER red 10 reps Seated horizontal abduction-adduction 2X5 Seated yellow ball circles in flexion plane at 90 deg X 10 CW,CCW Seated pendulums  X15 CW,CCW  PATIENT EDUCATION: 02/16/2023 Education details: HEP progression  Person educated: Patient Education method: Programmer, multimedia, Facilities manager, and Handouts Education comprehension: verbalized understanding, returned demonstration, and needs further education  HOME EXERCISE PROGRAM:  Access Code: [email or web address] URL: [email or web address] Date: 02/21/2023 Prepared by: Nedra Hai  Exercises - Seated Scapular Retraction  - 1 x daily - 7 x weekly - 2 sets - 10 reps - 3 seconds  hold - Standing Single Arm Shoulder Flexion Stretch on Wall  - 2 x daily - 7 x weekly - 1 sets - 10 reps - 5 seconds  hold - Supine Shoulder Flexion Extension Full Range AROM  - 1-2 x daily - 7 x weekly - 1-2 sets - 10-15 reps - Sidelying Shoulder Abduction Palm Forward  - 1-2 x daily - 7 x weekly - 1-2 sets - 10-15 reps - Sidelying Shoulder External Rotation  (Mirrored)  - 1-2 x daily - 7 x weekly - 1-2 sets - 10-15 reps - Standing Isometric Shoulder Flexion with Doorway  - 1 x daily - 7 x weekly - 3 sets - 10 reps - Standing Isometric Shoulder Abduction with Doorway  - 1 x daily - 7 x weekly - 3 sets - 10 reps - Standing Isometric Shoulder External Rotation with Doorway  - 1 x daily - 7 x weekly - 3 sets - 10 reps ASSESSMENT:  CLINICAL IMPRESSION: She has done well with PT and her shoulder has been feeling better. She does still have some weakness and some goals not met but she feels ready to transition to independent program today. I will hold her episode of care open for up to 30 days to allow her return at any pont during if needed.   OBJECTIVE IMPAIRMENTS: decreased activity tolerance, decreased ROM, decreased strength, hypomobility, increased fascial restrictions, increased muscle spasms, impaired UE functional use, postural dysfunction, obesity, and pain.   ACTIVITY LIMITATIONS: carrying, lifting, bathing, toileting, dressing, reach over head, and caring for others  PARTICIPATION LIMITATIONS: meal prep, cleaning, laundry, driving, shopping, community activity, yard work, and church  PERSONAL FACTORS: Age, Behavior pattern, Fitness, Past/current experiences, Social background, and Time since onset of injury/illness/exacerbation are also affecting patient's functional outcome.   REHAB POTENTIAL: Fair chronicity of impairments   CLINICAL DECISION MAKING: Stable/uncomplicated  EVALUATION COMPLEXITY: Low   GOALS: Goals reviewed with patient? No  SHORT TERM GOALS: Target date: 03/09/2023    Will be compliant with appropriate progressive HEP  Baseline: Goal status: met 04/02/23  2.  R shoulder  flexion and ABD AROM to be equal to that of left  Baseline:  Goal status: 3.  R shoulder FER AROM to be equal to that of the left  Baseline:  Goal status: met 04/02/23  4.  Will demonstrate improved postural awareness and biomechanics to  facilitate good GH and scapular mechanics  Baseline:  Goal status:met 04/02/23    LONG TERM GOALS: Target date: 04/06/2023    MMT to have improved by at least 1 grade in all weak groups  Baseline:  Goal status: ongoing 04/02/23, still with weakness in flexion, abduction,ER  2.  Pain R shoulder to be no more than 4/10 at worst  Baseline:  Goal status: MET 03/27/23  3.  Will be able to perform all self and home care tasks without difficulty or increased pain R UE  Baseline:  Goal status: she reports can do what she needs to. MET 03/27/23  4.  Will be able to reach overhead without difficulty or increased pain R UE  Baseline:  Goal status: some difficulty still with this, ongoing 04/02/23  5.  FOTO score to be within 10 points of predicted by time of DC  Baseline:  Goal status: MET 03/27/23    PLAN:  PT FREQUENCY: 1-2x/week  PT DURATION: 8 weeks  PLANNED INTERVENTIONS: 97164- PT Re-evaluation, 97110-Therapeutic exercises, 97530- Therapeutic activity, 97112- Neuromuscular re-education, 97535- Self Care, 16109- Manual therapy, [id number]- Aquatic Therapy, 97014- Electrical stimulation (unattended), 97016- Vasopneumatic device, [id number]- Ionotophoresis 4mg /ml Dexamethasone, Taping, Dry Needling, Cryotherapy, and Moist heat  PLAN FOR NEXT SESSION: hold for 30 days to trial independent program, will DC after 30 days If no return.  Ivery Quale, PT, DPT 04/02/23 12:08 PM

## 2023-04-04 ENCOUNTER — Encounter (HOSPITAL_COMMUNITY): Payer: Self-pay

## 2023-04-04 ENCOUNTER — Ambulatory Visit (HOSPITAL_COMMUNITY): Payer: No Typology Code available for payment source

## 2023-04-09 ENCOUNTER — Ambulatory Visit (HOSPITAL_COMMUNITY)
Admission: RE | Admit: 2023-04-09 | Discharge: 2023-04-09 | Disposition: A | Discharge status: Home / Self Care | Payer: No Typology Code available for payment source | Source: Ambulatory Visit | Attending: Neurological Surgery | Admitting: Neurological Surgery

## 2023-04-09 DIAGNOSIS — K760 Fatty (change of) liver, not elsewhere classified: Secondary | ICD-10-CM | POA: Diagnosis not present

## 2023-04-09 DIAGNOSIS — K7689 Other specified diseases of liver: Secondary | ICD-10-CM | POA: Diagnosis not present

## 2023-04-09 DIAGNOSIS — N281 Cyst of kidney, acquired: Secondary | ICD-10-CM | POA: Diagnosis not present

## 2023-04-09 DIAGNOSIS — N289 Disorder of kidney and ureter, unspecified: Secondary | ICD-10-CM | POA: Insufficient documentation

## 2023-04-09 MED ORDER — GADOBUTROL 1 MMOL/ML IV SOLN
10.0000 mL | Freq: Once | INTRAVENOUS | Status: AC | PRN
  Administered 2023-04-09: 10 mL via INTRAVENOUS

## 2023-04-10 ENCOUNTER — Encounter: Payer: MEDICARE | Primary: Geriatric Medicine

## 2023-04-16 ENCOUNTER — Encounter: Payer: Self-pay | Admitting: Family Medicine

## 2023-04-17 ENCOUNTER — Encounter

## 2023-04-25 ENCOUNTER — Inpatient Hospital Stay: Payer: MEDICARE | Primary: Geriatric Medicine

## 2023-05-01 DIAGNOSIS — D3001 Benign neoplasm of right kidney: Secondary | ICD-10-CM | POA: Diagnosis not present

## 2023-05-01 DIAGNOSIS — D3002 Benign neoplasm of left kidney: Secondary | ICD-10-CM | POA: Diagnosis not present

## 2023-05-13 ENCOUNTER — Other Ambulatory Visit: Payer: Self-pay | Admitting: Family Medicine

## 2023-05-14 ENCOUNTER — Other Ambulatory Visit: Payer: Self-pay | Admitting: Family Medicine

## 2023-05-14 NOTE — Telephone Encounter (Signed)
 Last filled 03-23-23 #30 Last OV 04-02-23 Next OV 02-15-24 [facility] [address]

## 2023-05-15 MED ORDER — TRAMADOL HCL 50 MG PO TABS: 50.0000 mg | ORAL_TABLET | Freq: Four times a day (QID) | ORAL | 0 refills | Status: DC | PRN

## 2023-05-15 NOTE — Telephone Encounter (Signed)
 Last office visit 04/02/23 for Right Shoulder with Dr. Patsy Lager.  Last refilled 03/23/23 for #30 with no refills.  Next Appt: CPE 02/15/24.

## 2023-05-16 ENCOUNTER — Inpatient Hospital Stay: Payer: MEDICARE | Primary: Geriatric Medicine

## 2023-05-18 ENCOUNTER — Encounter

## 2023-05-20 ENCOUNTER — Other Ambulatory Visit: Payer: Self-pay | Admitting: Family Medicine

## 2023-06-02 ENCOUNTER — Other Ambulatory Visit: Payer: Self-pay | Admitting: Family Medicine

## 2023-06-05 ENCOUNTER — Other Ambulatory Visit: Payer: Self-pay | Admitting: Family Medicine

## 2023-06-18 DIAGNOSIS — E785 Hyperlipidemia, unspecified: Secondary | ICD-10-CM | POA: Diagnosis not present

## 2023-06-18 DIAGNOSIS — E1122 Type 2 diabetes mellitus with diabetic chronic kidney disease: Secondary | ICD-10-CM | POA: Diagnosis not present

## 2023-06-18 DIAGNOSIS — E669 Obesity, unspecified: Secondary | ICD-10-CM | POA: Diagnosis not present

## 2023-06-18 DIAGNOSIS — N1832 Chronic kidney disease, stage 3b: Secondary | ICD-10-CM | POA: Diagnosis not present

## 2023-06-18 DIAGNOSIS — I129 Hypertensive chronic kidney disease with stage 1 through stage 4 chronic kidney disease, or unspecified chronic kidney disease: Secondary | ICD-10-CM | POA: Diagnosis not present

## 2023-06-18 DIAGNOSIS — E1169 Type 2 diabetes mellitus with other specified complication: Secondary | ICD-10-CM | POA: Diagnosis not present

## 2023-06-18 DIAGNOSIS — K219 Gastro-esophageal reflux disease without esophagitis: Secondary | ICD-10-CM | POA: Diagnosis not present

## 2023-06-18 DIAGNOSIS — Z6833 Body mass index (BMI) 33.0-33.9, adult: Secondary | ICD-10-CM | POA: Diagnosis not present

## 2023-06-18 DIAGNOSIS — Z008 Encounter for other general examination: Secondary | ICD-10-CM | POA: Diagnosis not present

## 2023-07-05 ENCOUNTER — Encounter: Payer: Self-pay | Admitting: Family Medicine

## 2023-07-05 ENCOUNTER — Telehealth: Payer: Self-pay | Admitting: *Deleted

## 2023-07-05 ENCOUNTER — Ambulatory Visit (HOSPITAL_BASED_OUTPATIENT_CLINIC_OR_DEPARTMENT_OTHER)
Admission: RE | Admit: 2023-07-05 | Discharge: 2023-07-05 | Disposition: A | Discharge status: Home / Self Care | Source: Ambulatory Visit | Attending: Family Medicine | Admitting: Family Medicine

## 2023-07-05 ENCOUNTER — Ambulatory Visit (INDEPENDENT_AMBULATORY_CARE_PROVIDER_SITE_OTHER): Admitting: Family Medicine

## 2023-07-05 VITALS — BP 120/78 | HR 78 | Temp 98.6°F | Ht 66.75 in | Wt 236.0 lb

## 2023-07-05 DIAGNOSIS — Z1231 Encounter for screening mammogram for malignant neoplasm of breast: Secondary | ICD-10-CM | POA: Diagnosis not present

## 2023-07-05 DIAGNOSIS — M79605 Pain in left leg: Secondary | ICD-10-CM | POA: Insufficient documentation

## 2023-07-05 DIAGNOSIS — M7989 Other specified soft tissue disorders: Secondary | ICD-10-CM | POA: Diagnosis not present

## 2023-07-05 DIAGNOSIS — M79662 Pain in left lower leg: Secondary | ICD-10-CM | POA: Diagnosis not present

## 2023-07-05 DIAGNOSIS — R600 Localized edema: Secondary | ICD-10-CM | POA: Diagnosis not present

## 2023-07-05 LAB — COMPREHENSIVE METABOLIC PANEL WITH GFR
ALT: 12 U/L (ref 0–35)
AST: 24 U/L (ref 0–37)
Albumin: 4.2 g/dL (ref 3.5–5.2)
Alkaline Phosphatase: 89 U/L (ref 39–117)
BUN: 18 mg/dL (ref 6–23)
CO2: 34 meq/L — ABNORMAL HIGH (ref 19–32)
Calcium: 9.5 mg/dL (ref 8.4–10.5)
Chloride: 103 meq/L (ref 96–112)
Creatinine, Ser: 1.17 mg/dL (ref 0.40–1.20)
GFR: 43.94 mL/min — ABNORMAL LOW (ref >60.00)
Glucose, Bld: 100 mg/dL — ABNORMAL HIGH (ref 70–99)
Potassium: 3.8 meq/L (ref 3.5–5.1)
Sodium: 143 meq/L (ref 135–145)
Total Bilirubin: 0.9 mg/dL (ref 0.2–1.2)
Total Protein: 7.4 g/dL (ref 6.0–8.3)

## 2023-07-05 LAB — BRAIN NATRIURETIC PEPTIDE: Pro B Natriuretic peptide (BNP): 94 pg/mL (ref 0.0–100.0)

## 2023-07-05 LAB — TSH: TSH: 0.88 u[IU]/mL (ref 0.35–5.50)

## 2023-07-05 MED ORDER — FUROSEMIDE 20 MG PO TABS: 40.0000 mg | ORAL_TABLET | Freq: Every day | ORAL | 0 refills | Status: DC

## 2023-07-05 NOTE — Progress Notes (Signed)
 Patient ID: Margaret Gordon, female    DOB: 11-19-1940, 83 y.o.   MRN: [id number]  This visit was conducted in person.  BP 120/78   Pulse 78   Temp 98.6 F (37 C) (Oral)   Ht 5' 6.75" (1.695 m)   Wt 236 lb (107 kg)   SpO2 98%   BMI 37.24 kg/m    CC:  Chief Complaint  Patient presents with   Leg Swelling    Left > Right swelling fells hard up to knee. Symptoms increased in last 2 weeks.     Subjective:   HPI: Margaret Gordon is a 83 y.o. female presenting on 07/05/2023 for Leg Swelling (Left > Right swelling fells hard up to knee. Symptoms increased in last 2 weeks. )  Chronic peripheral edema L > R... Now with hardness in left calf.   She is sore in left calf.  Noted worsening in left leg x 2 week.   Using lasix  20 mg daily  No recent chest pain, no Change in breathing  On amlodipine  5 mg daily  BP Readings from Last 3 Encounters:  07/05/23 120/78  04/02/23 112/72  02/13/23 100/60     Wt Readings from Last 3 Encounters:  07/05/23 236 lb (107 kg)  04/02/23 234 lb 6 oz (106.3 kg)  02/13/23 231 lb 4 oz (104.9 kg)     Has been taking more meloxicam  given low back apin.     Relevant past medical, surgical, family and social history reviewed and updated as indicated. Interim medical history since our last visit reviewed. Allergies and medications reviewed and updated. Outpatient Medications Prior to Visit  Medication Sig Dispense Refill   amLODipine  (NORVASC ) 5 MG tablet TAKE 1 TABLET (5 MG TOTAL) BY MOUTH DAILY. 90 tablet 2   atorvastatin  (LIPITOR) 10 MG tablet Take 10 mg by mouth daily.     Fluocinolone Acetonide Scalp 0.01 % OIL APPLY TO SCALP 4 HOURS OR OVERNIGHT PRIOR TO SHAMPOO, COVER WITH SHOWER CAP. REPEAT EVERY 10-14 DAYS     ketoconazole (NIZORAL) 2 % shampoo Apply topically.     meclizine  (ANTIVERT ) 25 MG tablet TAKE 1 TABLET (25 MG TOTAL) BY MOUTH DAILY AS NEEDED FOR DIZZINESS. 30 tablet 5   Multiple Vitamin (MULTIVITAMIN WITH MINERALS) TABS tablet  Take 1 tablet by mouth at bedtime.     pantoprazole  (PROTONIX ) 40 MG tablet TAKE 1 TABLET BY MOUTH EVERY DAY 90 tablet 1   traMADol  (ULTRAM ) 50 MG tablet TAKE 1 TABLET BY MOUTH EVERY 6 HOURS AS NEEDED. 30 tablet 0   triamcinolone  cream (KENALOG ) 0.1 % SMARTSIG:sparingly Topical Twice Daily     furosemide  (LASIX ) 20 MG tablet TAKE 1 TABLET BY MOUTH EVERY DAY 90 tablet 1   gabapentin  (NEURONTIN ) 300 MG capsule Take 1 capsule (300 mg total) by mouth 2 (two) times daily. 180 capsule 1   telmisartan -hydrochlorothiazide  (MICARDIS  HCT) 40-12.5 MG tablet TAKE 1 TABLET BY MOUTH EVERY DAY 90 tablet 2   atorvastatin  (LIPITOR) 10 MG tablet TAKE 1 TABLET BY MOUTH EVERY DAY 90 tablet 2   doxycycline (MONODOX) 100 MG capsule Take 100 mg by mouth daily.     traMADol  (ULTRAM ) 50 MG tablet Take 1 tablet (50 mg total) by mouth every 6 (six) hours as needed. 30 tablet 0   No facility-administered medications prior to visit.     Per HPI unless specifically indicated in ROS section below Review of Systems Objective:  BP 120/78   Pulse 78  Temp 98.6 F (37 C) (Oral)   Ht 5' 6.75" (1.695 m)   Wt 236 lb (107 kg)   SpO2 98%   BMI 37.24 kg/m   Wt Readings from Last 3 Encounters:  07/05/23 236 lb (107 kg)  04/02/23 234 lb 6 oz (106.3 kg)  02/13/23 231 lb 4 oz (104.9 kg)      Physical Exam Constitutional:      General: She is not in acute distress.    Appearance: Normal appearance. She is well-developed. She is not ill-appearing or toxic-appearing.  HENT:     Head: Normocephalic.     Right Ear: Hearing, tympanic membrane, ear canal and external ear normal. Tympanic membrane is not erythematous, retracted or bulging.     Left Ear: Hearing, tympanic membrane, ear canal and external ear normal. Tympanic membrane is not erythematous, retracted or bulging.     Nose: No mucosal edema or rhinorrhea.     Right Sinus: No maxillary sinus tenderness or frontal sinus tenderness.     Left Sinus: No maxillary sinus  tenderness or frontal sinus tenderness.     Mouth/Throat:     Pharynx: Uvula midline.  Eyes:     General: Lids are normal. Lids are everted, no foreign bodies appreciated.     Conjunctiva/sclera: Conjunctivae normal.     Pupils: Pupils are equal, round, and reactive to light.  Neck:     Thyroid : No thyroid  mass or thyromegaly.     Vascular: No carotid bruit.     Trachea: Trachea normal.  Cardiovascular:     Rate and Rhythm: Normal rate and regular rhythm.     Pulses: Normal pulses.     Heart sounds: Normal heart sounds, S1 normal and S2 normal. No murmur heard.    No friction rub. No gallop.     Comments: Varicose veins Pulmonary:     Effort: Pulmonary effort is normal. No tachypnea or respiratory distress.     Breath sounds: Normal breath sounds. No decreased breath sounds, wheezing, rhonchi or rales.  Abdominal:     General: Bowel sounds are normal.     Palpations: Abdomen is soft.     Tenderness: There is no abdominal tenderness.  Musculoskeletal:     Cervical back: Normal range of motion and neck supple.     Right lower leg: 2+ Pitting Edema present.     Left lower leg: 2+ Pitting Edema present.  Skin:    General: Skin is warm and dry.     Findings: No rash.  Neurological:     Mental Status: She is alert.  Psychiatric:        Mood and Affect: Mood is not anxious or depressed.        Speech: Speech normal.        Behavior: Behavior normal. Behavior is cooperative.        Thought Content: Thought content normal.        Judgment: Judgment normal.       Results for orders placed or performed in visit on 07/05/23  TSH   Collection Time: 07/05/23  1:12 PM  Result Value Ref Range   TSH 0.88 0.35 - 5.50 uIU/mL  Brain natriuretic peptide   Collection Time: 07/05/23  1:12 PM  Result Value Ref Range   Pro B Natriuretic peptide (BNP) 94.0 0.0 - 100.0 pg/mL  Comprehensive metabolic panel with GFR   Collection Time: 07/05/23  1:12 PM  Result Value Ref Range   Sodium 143  135 -  145 mEq/L   Potassium 3.8 3.5 - 5.1 mEq/L   Chloride 103 96 - 112 mEq/L   CO2 34 (H) 19 - 32 mEq/L   Glucose, Bld 100 (H) 70 - 99 mg/dL   BUN 18 6 - 23 mg/dL   Creatinine, Ser 1.61 0.40 - 1.20 mg/dL   Total Bilirubin 0.9 0.2 - 1.2 mg/dL   Alkaline Phosphatase 89 39 - 117 U/L   AST 24 0 - 37 U/L   ALT 12 0 - 35 U/L   Total Protein 7.4 6.0 - 8.3 g/dL   Albumin 4.2 3.5 - 5.2 g/dL   GFR 09.60 (L) >45.40 mL/min   Calcium  9.5 8.4 - 10.5 mg/dL    Assessment and Plan  Peripheral edema Assessment & Plan: Chronic with acute worsening.. Focal pain in left calf.  Will evaluate with ultrasound and to rule out DVT. Will also evaluate with labs for secondary cause of peripheral swelling worsening as well as move forward with echocardiogram to rule out congestive heart failure as source of peripheral swelling. She does continue to have significant evidence of venous insufficiency contributing to peripheral edema.   Increase lasix  to 2 tablets daily  for 4 days.  Return and ER precautions provided.    Orders: -     ECHOCARDIOGRAM COMPLETE; Future -     TSH -     Brain natriuretic peptide -     Comprehensive metabolic panel with GFR  Encounter for screening mammogram for malignant neoplasm of breast -     3D Screening Mammogram, Left and Right; Future  Left leg pain Assessment & Plan: Acute, refer for ultrasound to rule out DVT.  Orders: -     US  Venous Img Lower Unilateral Left (DVT); Future    No follow-ups on file.   Herby Lolling, MD

## 2023-07-05 NOTE — Telephone Encounter (Signed)
 Spoke with [facility].  She states patient called them to schedule a STAT Ultrasound.  She is scheduled this afternoon but patient did not know if they were suppose to hold her after the scan or send her home.  They also didn't have a call report phone number.   I advised Shasta to send patient home if scan is negative but hold and call Dr. Cherlyn Cornet is positive for DVT.  Dr. Millie Alm cell number provided.

## 2023-07-05 NOTE — Patient Instructions (Signed)
 Increase lasix  to 2 tablets daily  for 4 days.  Please stop at the lab to have labs drawn.  We will set up ECHO of heart and US  left leg to rule out DVT.

## 2023-07-05 NOTE — Assessment & Plan Note (Addendum)
 Chronic with acute worsening.. Focal pain in left calf.  Will evaluate with ultrasound and to rule out DVT. Will also evaluate with labs for secondary cause of peripheral swelling worsening as well as move forward with echocardiogram to rule out congestive heart failure as source of peripheral swelling. She does continue to have significant evidence of venous insufficiency contributing to peripheral edema.   Increase lasix  to 2 tablets daily  for 4 days.  Return and ER precautions provided.

## 2023-07-05 NOTE — Telephone Encounter (Signed)
 Copied from CRM [id number]. Topic: General - Other >> Jul 05, 2023  2:41 PM Freya Jesus wrote: Reason for CRM: Norrine Bedford from [facility] in the radiology department called and said patient called for a Stat Ultrasound  for DBT and they have follow up questions regarding this request. -- Call back: [phone]

## 2023-07-06 ENCOUNTER — Ambulatory Visit
Admission: RE | Admit: 2023-07-06 | Discharge: 2023-07-06 | Disposition: A | Discharge status: Home / Self Care | Source: Ambulatory Visit | Attending: Family Medicine | Admitting: Family Medicine

## 2023-07-06 DIAGNOSIS — Z1231 Encounter for screening mammogram for malignant neoplasm of breast: Secondary | ICD-10-CM | POA: Diagnosis not present

## 2023-07-09 ENCOUNTER — Ambulatory Visit (HOSPITAL_COMMUNITY): Attending: Cardiovascular Disease

## 2023-07-09 DIAGNOSIS — R600 Localized edema: Secondary | ICD-10-CM | POA: Diagnosis not present

## 2023-07-09 LAB — ECHOCARDIOGRAM COMPLETE
Area-P 1/2: 3.6 cm2
S' Lateral: 2.5 cm

## 2023-07-10 ENCOUNTER — Other Ambulatory Visit: Payer: Self-pay | Admitting: Family Medicine

## 2023-07-10 DIAGNOSIS — R600 Localized edema: Secondary | ICD-10-CM

## 2023-07-10 DIAGNOSIS — I83893 Varicose veins of bilateral lower extremities with other complications: Secondary | ICD-10-CM

## 2023-07-13 ENCOUNTER — Other Ambulatory Visit: Payer: Self-pay | Admitting: Family Medicine

## 2023-07-13 MED ORDER — GABAPENTIN 300 MG PO CAPS: 300.0000 mg | ORAL_CAPSULE | Freq: Two times a day (BID) | ORAL | 1 refills | Status: DC

## 2023-07-13 MED ORDER — FUROSEMIDE 20 MG PO TABS: 20.0000 mg | ORAL_TABLET | Freq: Every day | ORAL | 0 refills | Status: DC

## 2023-07-13 NOTE — Telephone Encounter (Signed)
 Last office visit 07/05/2023 for Leg Swelling.  Last refilled 03/23/2023 for #180 with 1 refill but needs Rx sent to different pharmacy.  Next Appt: CPE 02/15/24

## 2023-07-25 DIAGNOSIS — M79605 Pain in left leg: Secondary | ICD-10-CM | POA: Insufficient documentation

## 2023-07-25 NOTE — Assessment & Plan Note (Signed)
 Acute, refer for ultrasound to rule out DVT.

## 2023-08-18 ENCOUNTER — Other Ambulatory Visit: Payer: Self-pay | Admitting: Family Medicine

## 2023-08-21 NOTE — Telephone Encounter (Signed)
 Last office visit 07/05/23  Last refilled 11/10/23 for #30 with 5 refills.  Next Appt; 02/15/24

## 2023-09-06 ENCOUNTER — Ambulatory Visit (INDEPENDENT_AMBULATORY_CARE_PROVIDER_SITE_OTHER): Admitting: Family Medicine

## 2023-09-06 VITALS — BP 90/60 | HR 94 | Temp 98.6°F | Ht 66.75 in | Wt 237.5 lb

## 2023-09-06 DIAGNOSIS — R0789 Other chest pain: Secondary | ICD-10-CM | POA: Diagnosis not present

## 2023-09-06 DIAGNOSIS — W19XXXA Unspecified fall, initial encounter: Secondary | ICD-10-CM

## 2023-09-06 DIAGNOSIS — I890 Lymphedema, not elsewhere classified: Secondary | ICD-10-CM | POA: Diagnosis not present

## 2023-09-06 MED ORDER — HYDROCODONE-ACETAMINOPHEN 5-325 MG PO TABS: 1.0000 | ORAL_TABLET | Freq: Four times a day (QID) | ORAL | 0 refills | Status: DC | PRN

## 2023-09-06 NOTE — Assessment & Plan Note (Signed)
 Chronic peripheral edema, I wonder if this is more lymphedema given minimal response to Lasix  and no additional clear cause.  I have placed referral to vascular several months ago but have not heard back.  We will look into this referral in more detail and hopefully get additional vascular assessment.

## 2023-09-06 NOTE — Progress Notes (Signed)
 Patient ID: Margaret Gordon, female    DOB: 09/08/1940, 83 y.o.   MRN: [id number]  This visit was conducted in person.  BP 90/60   Pulse 94   Temp 98.6 F (37 C) (Oral)   Ht 5' 6.75 (1.695 m)   Wt 237 lb 8 oz (107.7 kg)   SpO2 98%   BMI 37.48 kg/m    CC:  Chief Complaint  Patient presents with   Fall    08/29/23    Pain in Ribs    Right    Subjective:   HPI: Margaret Gordon is a 83 y.o. female presenting on 09/06/2023 for Fall (08/29/23/) and Pain in Ribs (Right)  Recent fall accidental on August 29, 2023 was at a hotel,  lost balance and her side hit the table.  No preceding symptoms.  Landed on right ribcage. Resulted in pain over rib cage on right.. had contusion.  Pain with deep breath and constant. Able to slee at night, hard to get comfortable.   Continued to have leg swelling.  Has had precious work up.. CMET., BNP, TSH US  unremarkable.  ECHO: EF 55 to 60%.  Lasix  20-40 mg daily not helping.  She is elevating legs  as able.  Trouble getting compression hose on.     Tylenol  not helping 650 mg 2 BID.  Tramadol  not helping rib pain... using daily.   Relevant past medical, surgical, family and social history reviewed and updated as indicated. Interim medical history since our last visit reviewed. Allergies and medications reviewed and updated. Outpatient Medications Prior to Visit  Medication Sig Dispense Refill   amLODipine  (NORVASC ) 5 MG tablet TAKE 1 TABLET (5 MG TOTAL) BY MOUTH DAILY. 90 tablet 2   atorvastatin  (LIPITOR) 10 MG tablet Take 10 mg by mouth daily.     furosemide  (LASIX ) 20 MG tablet Take 1 tablet (20 mg total) by mouth daily for 4 days. 8 tablet 0   gabapentin  (NEURONTIN ) 300 MG capsule Take 1 capsule (300 mg total) by mouth 2 (two) times daily. 180 capsule 1   meclizine  (ANTIVERT ) 25 MG tablet TAKE 1 TABLET (25 MG TOTAL) BY MOUTH DAILY AS NEEDED FOR DIZZINESS. 30 tablet 5   Multiple Vitamin (MULTIVITAMIN WITH MINERALS) TABS tablet Take 1  tablet by mouth at bedtime.     pantoprazole  (PROTONIX ) 40 MG tablet TAKE 1 TABLET BY MOUTH EVERY DAY 90 tablet 1   telmisartan -hydrochlorothiazide  (MICARDIS  HCT) 40-12.5 MG tablet TAKE 1 TABLET BY MOUTH EVERY DAY 90 tablet 2   traMADol  (ULTRAM ) 50 MG tablet TAKE 1 TABLET BY MOUTH EVERY 6 HOURS AS NEEDED. 30 tablet 0   Fluocinolone Acetonide Scalp 0.01 % OIL APPLY TO SCALP 4 HOURS OR OVERNIGHT PRIOR TO SHAMPOO, COVER WITH SHOWER CAP. REPEAT EVERY 10-14 DAYS     ketoconazole (NIZORAL) 2 % shampoo Apply topically.     triamcinolone  cream (KENALOG ) 0.1 % SMARTSIG:sparingly Topical Twice Daily     No facility-administered medications prior to visit.     Per HPI unless specifically indicated in ROS section below Review of Systems  Constitutional:  Negative for fatigue and fever.  HENT:  Negative for congestion.   Eyes:  Negative for pain.  Respiratory:  Negative for cough and shortness of breath.   Cardiovascular:  Negative for chest pain, palpitations and leg swelling.  Gastrointestinal:  Negative for abdominal pain.  Genitourinary:  Negative for dysuria and vaginal bleeding.  Musculoskeletal:  Negative for back pain.  Neurological:  Negative  for syncope, light-headedness and headaches.  Psychiatric/Behavioral:  Negative for dysphoric mood.    Objective:  BP 90/60   Pulse 94   Temp 98.6 F (37 C) (Oral)   Ht 5' 6.75 (1.695 m)   Wt 237 lb 8 oz (107.7 kg)   SpO2 98%   BMI 37.48 kg/m   Wt Readings from Last 3 Encounters:  09/06/23 237 lb 8 oz (107.7 kg)  07/05/23 236 lb (107 kg)  04/02/23 234 lb 6 oz (106.3 kg)      Physical Exam Constitutional:      General: She is not in acute distress.    Appearance: Normal appearance. She is well-developed. She is not ill-appearing or toxic-appearing.  HENT:     Head: Normocephalic.     Right Ear: Hearing, tympanic membrane, ear canal and external ear normal. Tympanic membrane is not erythematous, retracted or bulging.     Left Ear:  Hearing, tympanic membrane, ear canal and external ear normal. Tympanic membrane is not erythematous, retracted or bulging.     Nose: No mucosal edema or rhinorrhea.     Right Sinus: No maxillary sinus tenderness or frontal sinus tenderness.     Left Sinus: No maxillary sinus tenderness or frontal sinus tenderness.     Mouth/Throat:     Pharynx: Uvula midline.  Eyes:     General: Lids are normal. Lids are everted, no foreign bodies appreciated.     Conjunctiva/sclera: Conjunctivae normal.     Pupils: Pupils are equal, round, and reactive to light.  Neck:     Thyroid : No thyroid  mass or thyromegaly.     Vascular: No carotid bruit.     Trachea: Trachea normal.  Cardiovascular:     Rate and Rhythm: Normal rate and regular rhythm.     Pulses: Normal pulses.     Heart sounds: Normal heart sounds, S1 normal and S2 normal. No murmur heard.    No friction rub. No gallop.  Pulmonary:     Effort: Pulmonary effort is normal. No tachypnea or respiratory distress.     Breath sounds: Normal breath sounds. No decreased breath sounds, wheezing, rhonchi or rales.  Chest:     Chest wall: Tenderness present. No mass, lacerations or deformity.       Comments: Ttp over lower rib cage on right, contusion healing Abdominal:     General: Bowel sounds are normal.     Palpations: Abdomen is soft.     Tenderness: There is no abdominal tenderness.  Musculoskeletal:     Cervical back: Normal range of motion and neck supple.     Right lower leg: 2+ Edema present.     Left lower leg: 2+ Edema present.  Skin:    General: Skin is warm and dry.     Findings: No rash.  Neurological:     Mental Status: She is alert.  Psychiatric:        Mood and Affect: Mood is not anxious or depressed.        Speech: Speech normal.        Behavior: Behavior normal. Behavior is cooperative.        Thought Content: Thought content normal.        Judgment: Judgment normal.       Results for orders placed or performed in  visit on 07/09/23  ECHOCARDIOGRAM COMPLETE   Collection Time: 07/09/23  2:59 PM  Result Value Ref Range   S' Lateral 2.50 cm   Area-P 1/2 3.60 cm2  Est EF 55 - 60%     Assessment and Plan  Chest wall pain Assessment & Plan: New, likely rib fracture or rib contusion. Lungs clear to auscultation. Recommend incentive spirometry/deep breathing and pain control.  Tramadol  and Tylenol  not helping much with pain.  She will use Tylenol  for more mild pain and can use hydrocodone  as needed for more severe pain.   Accidental fall, initial encounter  Lymphedema Assessment & Plan: Chronic peripheral edema, I wonder if this is more lymphedema given minimal response to Lasix  and no additional clear cause.  I have placed referral to vascular several months ago but have not heard back.  We will look into this referral in more detail and hopefully get additional vascular assessment.   Other orders -     HYDROcodone -Acetaminophen ; Take 1 tablet by mouth every 6 (six) hours as needed for moderate pain (pain score 4-6).  Dispense: 30 tablet; Refill: 0    No follow-ups on file.   Greig Ring, MD

## 2023-09-06 NOTE — Assessment & Plan Note (Signed)
 New, likely rib fracture or rib contusion. Lungs clear to auscultation. Recommend incentive spirometry/deep breathing and pain control.  Tramadol  and Tylenol  not helping much with pain.  She will use Tylenol  for more mild pain and can use hydrocodone  as needed for more severe pain.

## 2023-09-06 NOTE — Patient Instructions (Addendum)
 Take deep breaths to avoid lung infection.  Use pain hydrocodone  as needed for severe pain, can use tylenol  for milder pain, but do not go above 4000 mg daily of acetaminophen .  Hold tramadol  while on hydrocodone .  Follow up if not improving in 2 weeks, sooner if fever or shortness of breath.

## 2023-09-21 ENCOUNTER — Other Ambulatory Visit: Payer: Self-pay | Admitting: Family Medicine

## 2023-10-12 ENCOUNTER — Other Ambulatory Visit: Payer: Self-pay | Admitting: *Deleted

## 2023-10-12 DIAGNOSIS — I872 Venous insufficiency (chronic) (peripheral): Secondary | ICD-10-CM

## 2023-10-12 DIAGNOSIS — D3002 Benign neoplasm of left kidney: Secondary | ICD-10-CM | POA: Diagnosis not present

## 2023-10-21 ENCOUNTER — Emergency Department: Admit: 2023-10-21 | Payer: Medicare (Managed Care) | Primary: Geriatric Medicine

## 2023-10-21 ENCOUNTER — Inpatient Hospital Stay
Admit: 2023-10-21 | Discharge: 2023-10-26 | Disposition: A | Discharge status: Home Health | Payer: Medicare (Managed Care) | Attending: Hospitalist | Admitting: Hospitalist

## 2023-10-21 DIAGNOSIS — I130 Hypertensive heart and chronic kidney disease with heart failure and stage 1 through stage 4 chronic kidney disease, or unspecified chronic kidney disease: Principal | ICD-10-CM

## 2023-10-21 DIAGNOSIS — R0603 Acute respiratory distress: Principal | ICD-10-CM

## 2023-10-21 LAB — CBC WITH AUTO DIFFERENTIAL
Basophils %: 0.5 % (ref 0.0–1.0)
Basophils Absolute: 0.02 K/UL (ref 0.00–0.10)
Eosinophils %: 1.4 % (ref 0.0–7.0)
Eosinophils Absolute: 0.06 K/UL (ref 0.00–0.40)
Hematocrit: 43.1 % (ref 35.0–47.0)
Hemoglobin: 14.5 g/dL (ref 11.5–16.0)
Immature Granulocytes %: 0.2 % (ref 0.0–0.5)
Immature Granulocytes Absolute: 0.01 K/UL (ref 0.00–0.04)
Lymphocytes %: 18.7 % (ref 12.0–49.0)
Lymphocytes Absolute: 0.8 K/UL (ref 0.80–3.50)
MCH: 31.7 pg (ref 26.0–34.0)
MCHC: 33.6 g/dL (ref 30.0–36.5)
MCV: 94.1 FL (ref 80.0–99.0)
Monocytes %: 9.1 % (ref 5.0–13.0)
Monocytes Absolute: 0.39 K/UL (ref 0.00–1.00)
Neutrophils %: 70.1 % (ref 32.0–75.0)
Neutrophils Absolute: 3 K/UL (ref 1.80–8.00)
Nucleated RBCs: 1.4 /100{WBCs} — ABNORMAL HIGH
Platelets: 116 K/uL — ABNORMAL LOW (ref 150–400)
RBC: 4.58 M/uL (ref 3.80–5.20)
RDW: 18.8 % — ABNORMAL HIGH (ref 11.5–14.5)
WBC: 4.3 K/uL (ref 3.6–11.0)
nRBC: 0.06 K/uL — ABNORMAL HIGH (ref 0.00–0.01)

## 2023-10-21 LAB — URINALYSIS WITH MICROSCOPIC
BACTERIA, URINE: NEGATIVE /HPF
Glucose, Ur: NEGATIVE mg/dL
Ketones, Urine: NEGATIVE mg/dL
Nitrite, Urine: POSITIVE — AB
Protein, UA: 100 mg/dL — AB
Specific Gravity, UA: 1.025 (ref 1.003–1.030)
Urobilinogen, Urine: 1 EU/dL (ref 0.2–1.0)
pH, Urine: 5 (ref 5.0–8.0)

## 2023-10-21 LAB — ARTERIAL BLOOD GAS, POC
Base Deficit: 0.3 mmol/L
POC Allen's Test: POSITIVE
POC HCO3: 21.9 mmol/L (ref 21–28)
POC O2 SAT: 93.7 % (ref 92–97)
POC PO2: 63 mmHg — ABNORMAL LOW (ref 83–108)
POC pCO2: 28.7 mmHg — ABNORMAL LOW (ref 35.0–48.0)
POC pH: 7.49 — ABNORMAL HIGH (ref 7.35–7.45)

## 2023-10-21 LAB — COMPREHENSIVE METABOLIC PANEL
ALT: <5 U/L — ABNORMAL LOW (ref 10–35)
AST: 21 U/L (ref 10–35)
Albumin/Globulin Ratio: 0.9 — ABNORMAL LOW (ref 1.1–2.2)
Albumin: 3.5 g/dL (ref 3.5–5.2)
Alk Phosphatase: 63 U/L (ref 35–104)
Anion Gap: 17 mmol/L — ABNORMAL HIGH (ref 2–14)
BUN: 17 mg/dL (ref 8–23)
CO2: 19 mmol/L — ABNORMAL LOW (ref 20–29)
Calcium: 9.2 mg/dL (ref 8.8–10.2)
Chloride: 107 mmol/L (ref 98–107)
Creatinine: 1.12 mg/dL — ABNORMAL HIGH (ref 0.60–1.00)
Est, Glom Filt Rate: 49 ml/min/1.73m2 — ABNORMAL LOW (ref >90)
Globulin: 4 g/dL (ref 2.0–4.0)
Glucose: 126 mg/dL — ABNORMAL HIGH (ref 65–100)
Potassium: 3.7 mmol/L (ref 3.5–5.1)
Sodium: 143 mmol/L (ref 136–145)
Total Bilirubin: 4.3 mg/dL — ABNORMAL HIGH (ref 0.0–1.2)
Total Protein: 7.4 g/dL (ref 6.4–8.3)

## 2023-10-21 LAB — TROPONIN: Troponin T: 25.5 ng/L — ABNORMAL HIGH (ref 0–14)

## 2023-10-21 LAB — EKG 12-LEAD
Q-T Interval: 334 ms
QRS Duration: 104 ms
QTc Calculation (Bazett): 410 ms
R Axis: 123 degrees
T Axis: -78 degrees
Ventricular Rate: 91 {beats}/min

## 2023-10-21 LAB — BILIRUBIN, CONFIRMATORY: Bilirubin Confirmation, UA: NEGATIVE

## 2023-10-21 LAB — EXTRA TUBES HOLD

## 2023-10-21 LAB — BRAIN NATRIURETIC PEPTIDE: NT Pro-BNP: 7990 pg/mL — ABNORMAL HIGH (ref 0–450)

## 2023-10-21 MED ORDER — POLYETHYLENE GLYCOL 3350 17 G PO PACK
17 | Freq: Every day | ORAL | Status: AC | PRN
Start: 2023-10-21 — End: ?

## 2023-10-21 MED ORDER — POTASSIUM CHLORIDE ER 10 MEQ PO TBCR
10 | ORAL | Status: AC | PRN
Start: 2023-10-21 — End: ?

## 2023-10-21 MED ORDER — BUDESONIDE 0.25 MG/2ML IN SUSP
0.25 | Freq: Two times a day (BID) | RESPIRATORY_TRACT | Status: AC
Start: 2023-10-21 — End: ?
  Administered 2023-10-22 – 2023-10-25 (×8): 250 mg via RESPIRATORY_TRACT

## 2023-10-21 MED ORDER — POTASSIUM BICARB-CITRIC ACID 20 MEQ PO TBEF
20 | ORAL | Status: AC | PRN
Start: 2023-10-21 — End: ?

## 2023-10-21 MED ORDER — POTASSIUM CHLORIDE 10 MEQ/100ML IV SOLN
10 | INTRAVENOUS | Status: AC | PRN
Start: 2023-10-21 — End: ?

## 2023-10-21 MED ORDER — NORMAL SALINE FLUSH 0.9 % IV SOLN
0.9 | INTRAVENOUS | Status: AC | PRN
Start: 2023-10-21 — End: ?

## 2023-10-21 MED ORDER — METHIMAZOLE 5 MG PO TABS
5 | Freq: Every day | ORAL | Status: AC
Start: 2023-10-21 — End: ?
  Administered 2023-10-22 – 2023-10-25 (×4): 5 mg via ORAL

## 2023-10-21 MED ORDER — METOPROLOL TARTRATE 25 MG PO TABS
25 | Freq: Two times a day (BID) | ORAL | Status: AC
Start: 2023-10-21 — End: ?
  Administered 2023-10-22 – 2023-10-25 (×6): 25 mg via ORAL

## 2023-10-21 MED ORDER — ONDANSETRON HCL 4 MG/2ML IJ SOLN
4 | Freq: Four times a day (QID) | INTRAMUSCULAR | Status: AC | PRN
Start: 2023-10-21 — End: ?

## 2023-10-21 MED ORDER — DILTIAZEM HCL 25 MG/5ML IV SOLN
25 | INTRAVENOUS | Status: AC
Start: 2023-10-21 — End: 2023-10-21
  Administered 2023-10-21: 23:00:00 10 mg via INTRAVENOUS

## 2023-10-21 MED ORDER — ACETAMINOPHEN 325 MG PO TABS
325 | Freq: Four times a day (QID) | ORAL | Status: AC | PRN
Start: 2023-10-21 — End: ?
  Administered 2023-10-22 – 2023-10-25 (×4): 650 mg via ORAL

## 2023-10-21 MED ORDER — FUROSEMIDE 10 MG/ML IJ SOLN
10 | INTRAMUSCULAR | Status: AC
Start: 2023-10-21 — End: 2023-10-21
  Administered 2023-10-21: 23:00:00 40 mg via INTRAVENOUS

## 2023-10-21 MED ORDER — SODIUM CHLORIDE 0.9 % IV SOLN
0.9 | INTRAVENOUS | Status: AC
Start: 2023-10-21 — End: ?
  Administered 2023-10-21: 23:00:00 2.5 mg/h via INTRAVENOUS

## 2023-10-21 MED ORDER — METOPROLOL TARTRATE 50 MG PO TABS
50 | Freq: Two times a day (BID) | ORAL | Status: DC
Start: 2023-10-21 — End: 2023-10-21

## 2023-10-21 MED ORDER — NORMAL SALINE FLUSH 0.9 % IV SOLN
0.9 | Freq: Two times a day (BID) | INTRAVENOUS | Status: AC
Start: 2023-10-21 — End: ?
  Administered 2023-10-22 – 2023-10-25 (×6): 10 mL via INTRAVENOUS

## 2023-10-21 MED ORDER — ACETAMINOPHEN 650 MG RE SUPP
650 | Freq: Four times a day (QID) | RECTAL | Status: AC | PRN
Start: 2023-10-21 — End: ?

## 2023-10-21 MED ORDER — SODIUM CHLORIDE 0.9 % IV SOLN
0.9 | INTRAVENOUS | Status: AC | PRN
Start: 2023-10-21 — End: ?

## 2023-10-21 MED ORDER — ONDANSETRON 4 MG PO TBDP
4 | Freq: Three times a day (TID) | ORAL | Status: AC | PRN
Start: 2023-10-21 — End: ?

## 2023-10-21 MED ORDER — FUROSEMIDE 10 MG/ML IJ SOLN
10 | Freq: Two times a day (BID) | INTRAMUSCULAR | Status: DC
Start: 2023-10-21 — End: 2023-10-22
  Administered 2023-10-22: 13:00:00 20 mg via INTRAVENOUS

## 2023-10-21 MED ORDER — ROSUVASTATIN CALCIUM 10 MG PO TABS
10 | Freq: Every evening | ORAL | Status: AC
Start: 2023-10-21 — End: ?
  Administered 2023-10-22 – 2023-10-25 (×4): 20 mg via ORAL

## 2023-10-21 MED ORDER — MAGNESIUM SULFATE 2000 MG/50 ML IVPB PREMIX
2 | INTRAVENOUS | Status: AC | PRN
Start: 2023-10-21 — End: ?

## 2023-10-21 MED ORDER — ENOXAPARIN SODIUM 150 MG/ML IJ SOSY
150 | Freq: Two times a day (BID) | INTRAMUSCULAR | Status: DC
Start: 2023-10-21 — End: 2023-10-22
  Administered 2023-10-22 (×2): 135 mg/kg via SUBCUTANEOUS

## 2023-10-21 MED FILL — DILTIAZEM HCL 125 MG/25ML IV SOLN: 125 MG/25ML | INTRAVENOUS | Qty: 125 | Fill #0

## 2023-10-21 MED FILL — ENOXAPARIN SODIUM 150 MG/ML IJ SOSY: 150 mg/mL | INTRAMUSCULAR | Qty: 0.9 | Fill #0

## 2023-10-21 MED FILL — FUROSEMIDE 10 MG/ML IJ SOLN: 10 mg/mL | INTRAMUSCULAR | Qty: 4 | Fill #0

## 2023-10-21 MED FILL — DILTIAZEM HCL 25 MG/5ML IV SOLN: 25 MG/5ML | INTRAVENOUS | Qty: 5 | Fill #0

## 2023-10-21 MED FILL — MONOJECT FLUSH SYRINGE 0.9 % IV SOLN: 0.9 % | INTRAVENOUS | Qty: 40 | Fill #0

## 2023-10-21 NOTE — ED Notes (Signed)
 ED TO INPATIENT SBAR HANDOFF    Patient Name: Allison Newman   DOB:  Jun 22, 1940  83 y.o.   MRN:  [id number]  ED Room #:  ER14/14     Situation  Code Status: Full Code   Allergies: Almond oil, Macadamia nut oil, and Other  Weight: Patient Vitals for the past 96 hrs (Last 3 readings):   Weight   10/21/23 1406 (!) 140.8 kg (310 lb 6.5 oz)       Arrived from: home    Chief Complaint:   Chief Complaint   Patient presents with    Leg Swelling       Hospital Problem/Diagnosis:  Principal Problem:    Atrial fibrillation with rapid ventricular response (HCC)  Resolved Problems:    * No resolved hospital problems. *      Mobility: limited bed mobility   ED Fall Risk: Presents to emergency department  because of falls (Syncope, seizure, or loss of consciousness): No, Age > 70: Yes, Altered Mental Status, Intoxication with alcohol or substance confusion (Disorientation, impaired judgment, poor safety awaremess, or inability to follow instructions): No, Impaired Mobility: Ambulates or transfers with assistive devices or assistance; Unable to ambulate or transer.: Yes, Nursing Judgement: Yes   Fell in ED or prior to admission: no   Restraints: no     Sitter: no   Family/Caregiver Present: no    Neet to know social/safety information:    Background  History:   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    Diabetes (HCC)     Elevated BUN 09/13/2011    Finger lesion 03/02/2016    Gallbladder polyp 04/08/2013    Gastrointestinal disorder     GERD    GERD (gastroesophageal reflux disease)     Hypercholesterolemia     Hypertension      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 Unchanged cardiomegaly and   evidence of enlarged thyroid gland.         Electronically signed by Wendelyn JINNY Hoops        Abnormal labs:   Abnormal Labs Reviewed   COMPREHENSIVE METABOLIC PANEL - Abnormal; Notable for the following components:       Result Value    CO2 19 (*)     Anion Gap 17 (*)     Glucose 126 (*)  Creatinine 1.12 (*)     Est, Glom Filt Rate 49 (*)     Total Bilirubin 4.3 (*)     ALT <5 (*)     Albumin /Globulin Ratio 0.9 (*)     All other components within normal limits   BRAIN NATRIURETIC PEPTIDE - Abnormal; Notable for the following components:    NT Pro-BNP 7,990 (*)     All other components within normal limits   URINALYSIS WITH MICROSCOPIC - Abnormal; Notable for the following components:    Protein, UA 100 (*)     Blood, Urine MODERATE (*)     Nitrite, Urine Positive (*)     Leukocyte Esterase, Urine SMALL (*)     All other components within normal limits   CBC WITH AUTO DIFFERENTIAL - Abnormal; Notable for the following components:    RDW 18.8 (*)     Platelets 116 (*)     Nucleated RBCs 1.4 (*)     nRBC 0.06 (*)     All other components within normal limits   TROPONIN - Abnormal; Notable for the following components:    Troponin T 25.5 (*)     All other components within normal limits   ARTERIAL BLOOD GAS, POC - Abnormal; Notable for the following components:    POC pH 7.49 (*)     POC pCO2 28.7 (*)     POC  PO2 63 (*)     All other components within normal limits         Vitals/MEWS: MEWS Score: 2  Level of Consciousness: Alert (0)   Vitals:    10/21/23 2230 10/21/23 2245 10/21/23 2300 10/21/23 2315   BP: 100/84 104/72 110/65 108/89   Pulse: 87 75 78 85   Resp: 24 25 20 21    Temp:       TempSrc:       SpO2: 91% 91% 91% 92%   Weight:       Height:         DI:   Predictive Model Details          39 (Caution)  Factor Value    Calculated 10/21/2023 23:29 42% Age 79 years old    Deterioration Index Model 28% Supplemental oxygen Oxygen     17% Respiratory rate 21     10% Cardiac rhythm Atrial fib     5% Sodium 143 mmol/L     4% Pulse oximetry 92 %     1% Platelet count abnormal (116 K/uL)     1% Potassium 3.7 mmol/L     1% Systolic 108     1% Pulse 85     0% Hematocrit 43.1 %     0% WBC count 4.3 K/uL     0% Temperature 98.2 F (36.8 C)         FiO2 (%): nasal cannula for respiratory distress  O2 Flow Rate: O2 Device: Nasal cannula O2 Flow Rate (L/min): 4 L/min  Cardiac Rhythm: Cardiac Rhythm: Atrial fib    Pain Scale: Pain Assessment  Pain Assessment: 0-10  Pain Level: 6  Patient's Stated Pain Goal: 0 - No pain  Pain Location: Generalized  Pain Descriptors: Aching  Functional Pain Assessment: Activities are not prevented  Pain Type: Chronic pain  Pain Radiating Towards: n/a  Pain Frequency: Continuous  Non-Pharmaceutical Pain Intervention(s): Rest  Response to Pain Intervention: None (pain unchanged or no improvement)  Side Effects: No side effects reported or observed  Last documented pain score: (  0-10 scale) Pain Level: 6  Last documented pain medication administered:      Mental Status: oriented, alert, coherent, logical, and thought processes intact  Orientation Level:    NIH Score:    C-SSRS: Risk of Suicide: No Risk    PO Status: Regular  Bedside swallow:    Glasgow Coma Scale (GCS): Glasgow Coma Scale  Eye Opening: Spontaneous  Best Verbal Response: Oriented  Best Motor Response: Obeys commands  Glasgow Coma Scale  Score: 15    Active LDA's:   Peripheral IV 10/21/23 Right Forearm (Active)   Site Assessment Clean, dry & intact 10/21/23 1449   Line Status Normal saline locked;Flushed 10/21/23 1449   Phlebitis Assessment No symptoms 10/21/23 1449   Infiltration Assessment 0 10/21/23 1449     Pertinent or High Risk Medications/Drips:    If Yes, please provide details:   Titratable drips:    Pending Blood Product Administration:      Sepsis: Sepsis Risk Score   Predictive Model Details          27 (Low)  Factor Value    Calculated 10/21/2023 23:19 10% O2 Delivery Method NASAL CANNULA    [facility] EARLY DETECTION OF SEPSIS VERSION 2 Model 9% Respirations 25     7% Total Active Inpatient Meds 19     7% Bilirubin 4.3 MG/DL     -6% Duration of Encounter .4 days     28% Age 81 years old     5% Albumin  3.5 g/dL     -4% WBC Count 4.3 K/uL     -4% AST 21 U/L     3% Has Imaging Procedure present      Recent Labs     10/21/23  1416   WBC 4.3     Blood Cultures Drawn: No   am  Repeat LA: Time Due   Antibiotic Given: No  Fluid Resuscitation: Total needed , Status other     VS x 2 post-fluid resuscitation: N/A    Recommendation    Plan for next 24 hours: continue treatment for A-Fib and fluid overload  Additional Comments: Patient is very excoriated in the lower half of body zinc cream was applied      Isolation:Patient no longer requires any isolation precautions.  Pending orders:   Consults ordered: IP CONSULT TO CARDIOLOGY    Consulted provider:      If any further questions, please call Sending RN at [phone]  Electronically signed by: Electronically signed by Alm Blush, RN on 10/21/2023 at 11:29 PM

## 2023-10-21 NOTE — ED Notes (Signed)
 Verbal shift change report given to Alm RN (oncoming nurse) by Lauraine PEAK (offgoing nurse). Report included the following information ED Encounter Summary, ED SBAR, Recent Results, and Med Rec Status.

## 2023-10-21 NOTE — ED Provider Notes (Signed)
 HISTORY OF PRESENT ILLNESS  83 year old female with a history of pulmonary embolism, diabetes mellitus, morbid obesity, chronic obstructive pulmonary disease, chronic kidney disease, and hypertension presents with progressive volume overload over the past 3 to 4 days (10-20-2023 to 10-21-2023). She reports increasing bilateral lower extremity swelling, mild exertional dyspnea, and notes that her symptoms have steadily worsened during this period. She describes 2+ edema extending to her thighs and a protuberant, nontender abdomen. She uses supplemental oxygen at home as needed.    PAST MEDICAL HISTORY  - Pulmonary embolism  - Diabetes mellitus  - Morbid obesity  - Chronic obstructive pulmonary disease  - Chronic kidney disease  - Hypertension    PHYSICAL EXAM  Vitals: Interpreted as normal for this patient.  General: Morbidly obese female, no acute distress.  Eyes: Appear normal with no scleral icterus.  HENT: Atraumatic; moist mucous membranes; no pharyngeal erythema, edema or lesions.  Neck: Atraumatic; supple.  Cardiac: Regular rate and rhythm.  Respiratory: Diminished breath sounds at bases bilaterally; mild rales.  Abdomen: Protuberant; nontender.  GU: No CVAT.  MS: 2+ bilateral lower extremity edema extending to thighs.  Skin: No exanthems; no cyanosis; no diaphoresis.  Back exam: Atraumatic.  Neurologic: No altered mental status; speech is fluent; gait is within normal limits; no cerebellar deficits; no motor deficits; no sensory deficits.  Psychological: Cooperative and participatory with examination.    SUMMARY  83 year old female presented with progressive volume overload and exertional dyspnea. Exam revealed 2+ bilateral lower extremity edema and protuberant nontender abdomen. Labs showed markedly elevated NT-proBNP and mildly elevated troponin. Chest x-ray demonstrated unchanged cardiomegaly without acute process. Diagnosis of acute on chronic systolic heart failure was made. Admission for inpatient  management was recommended and discussed with the patient, who was agreeable and understood the plan.    IMAGING  CXR: A one view portable CXR was obtained to evaluate for cardiopulmonary pathology.  I contemporaneously reviewed it as demonstrating no significant widening of the mediastinum, pneumothorax, mass, infiltrate, consolidation, or free air under the diaphragm. The study shows no acute cardiopulmonary process. Additional findings: unchanged cardiomegaly.    LABS  Notable laboratory findings included: troponin mildly elevated, NT-proBNP markedly elevated at 7990.    PATIENT DISCUSSION  I discussed available results and the treatment plan with the patient. The patient was agreeable and demonstrated understanding of the plan.    MEDICAL DECISION MAKING  This 83 year old female presented with acute on chronic systolic heart failure and extensive volume overload. Objective data supporting the diagnosis include a markedly elevated NT-proBNP of 7990 and mildly elevated troponin, consistent with decompensated heart failure and myocardial strain. Chest x-ray showed unchanged cardiomegaly without acute cardiopulmonary process. The patient's comorbidities--morbid obesity, chronic kidney disease, diabetes mellitus, and chronic obstructive pulmonary disease--further increase her risk for complications and limit outpatient management options.    A gradual diuresis was planned to address the volume overload, with careful consideration of her renal function and comorbidities. The moderate HEART score of 5, driven by suspicious history, advanced age, multiple risk factors, and mildly elevated troponin, indicates intermediate risk for major cardiac events and supports inpatient monitoring. The patient was re-examined after initial management and remained stable.    Results and management plan were discussed with the patient, who is agreeable and understands the need for admission. Admission for inpatient management is  warranted given her high-risk features, abnormal cardiac biomarkers, and need for close monitoring and titration of therapy.    DIFFERENTIAL DIAGNOSES  - Chronic Obstructive Pulmonary Disease  Exacerbation: Considered due to history of COPD and mild exertional dyspnea; less likely as breath sounds are diminished but no increased work of breathing or acute respiratory distress.  - Nephrotic Syndrome: Considered due to significant bilateral lower extremity edema; less likely as there is no mention of proteinuria or hypoalbuminemia.  - Cirrhosis With Ascites: Considered due to protuberant abdomen and edema; less likely as abdomen is nontender and there is no history of liver disease or stigmata of chronic liver disease.  - Deep Vein Thrombosis: Considered due to bilateral leg edema; less likely as edema is symmetric and extends to thighs, with no unilateral findings or reported pain.  - Medication-Induced Edema: Considered as a potential cause of volume overload; less likely as there is no documentation of recent medication changes or agents known to cause edema.    DISPOSITION  Inpatient: Hospital    Admission is warranted for acute on chronic systolic heart failure with extensive volume overload, markedly elevated NT-proBNP, and mildly elevated troponin, in the context of multiple high-risk comorbidities. The patient requires gradual diuresis, close monitoring, and inpatient management due to risk of complications and inability to safely manage as an outpatient.    Results and plan discussed with patient; patient agreeable and understands plan    DIAGNOSIS  Primary Diagnosis:   - Acute on chronic systolic heart failure (I50.23)    Secondary Diagnoses:  - Volume overload (R60.0)  - Elevated troponin (R79.89)  - Elevated BNP (R77.8)  - Morbid obesity (E66.01)  - Chronic kidney disease (N18.9)  - Diabetes mellitus (E11.9)  - Chronic obstructive pulmonary disease (J44.9)    Perfect Serve Consult for Admission  6:29  PM    ED Room Number: ER14/14  Patient Name and age:  Allison Newman 83 y.o.  female  Working Diagnosis:   1. Acute respiratory distress    2. Hypoxemia    3. Atrial fibrillation with rapid ventricular response (HCC)        COVID-19 Suspicion: No  Sepsis present:  No  Reassessment needed: Yes  Code Status:  Full Code  Readmission: No  Isolation Requirements: no  Recommended Level of Care: step down  Department: [facility] ED - [phone]  Consulting Provider:     Other:      Total critical care time spent exclusive of procedures:  48 minutes.         Lenon Celestine ORN, MD  10/24/23 [phone]

## 2023-10-21 NOTE — H&P (Signed)
 History and Physical    Date of Service:  10/21/2023  Primary Care Provider: Avie True PEDLAR, MD  Source of information: The patient and Spouse/family member (son at bedside)    Chief Complaint: Leg Swelling      History of Presenting Illness:   Allison Newman is a 83 y.o. female who presents with above.  Patient came to the emergency room today for evaluation of 2 to 3 weeks of increasing edema.  She also had worsening weakness and was not able to get up out of bed, so finally agreed to let her son have her brought to the emergency room.  Patient at baseline lives at home with a different son and is responsible for her own medical care.  She does have diabetes and lymphedema, no longer following with a lymphedema clinic.  Patient says that she noticed over the last few weeks that her swelling in her legs has been getting worse.  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  Patient has oxygen at home that she uses sometimes and cannot really describe when she is supposed to be using oxygen.  Patient denies any cardiac history including CAD, CHF or arrhythmia.  Patient does state that she has intermittently had palpitations over the last year but none recently.  Patient denies chest pain.      The patient denies any headache, blurry vision, sore throat, trouble swallowing, trouble with speech, chest pain, SOB, cough, fever, chills, N/V/D, abd pain, urinary symptoms, constipation, recent travels, sick contacts, focal or generalized neurological symptoms, falls, injuries, rashes, contact with COVID-19 diagnosed patients, hematemesis, melena, hemoptysis, hematuria, rashes, denies starting any new medications and denies any other concerns or problems besides as mentioned above.       REVIEW OF SYSTEMS:  A comprehensive review of systems was negative except for that written in the History of Present Illness.     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    Chronic pain of left knee 08/13/2014    Chronic right hip pain 03/17/2013    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    Diabetes (HCC)     Elevated BUN 09/13/2011    Finger lesion 03/02/2016    Gallbladder polyp 04/08/2013    Gastrointestinal disorder     GERD    GERD (gastroesophageal reflux disease)     Hypercholesterolemia     Hypertension     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    Microalbuminuria 08/29/2013    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, adult (HCC) 08/28/2011    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    On aspirin  at home 05/03/2015    Other unknown and unspecified cause of morbidity or mortality     hx of bronchitis    Rash, skin 09/25/2012    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) 03/02/2016    Slow transit constipation 08/13/2014    Umbilical hernia 05/08/2013      Past Surgical History:   Procedure Laterality Date    COLONOSCOPY N/A 04/23/2018    COLONOSCOPY AND ESOPHAGOGASTRODUODENOSCOPY (  EGD) performed by Rockland Carlus SQUIBB., MD at [facility] ENDOSCOPY    ORTHOPEDIC SURGERY  2000    screw in foot left    ORTHOPEDIC SURGERY  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    OTHER SURGICAL HISTORY      colonoscopy    TUBAL LIGATION       Prior to Admission medications   Medication Sig Start Date End Date Taking? Authorizing Provider   meloxicam  (MOBIC ) 7.5 MG tablet Take 1 tablet by mouth daily for 10 days 06/03/22 06/13/22  Milligram, Alm HERO, PA-C   Lancets MISC Use BID. Dx: E11.9 03/27/17   Automatic Reconciliation, Ar   aspirin  81 MG EC tablet Take 1 tablet by mouth daily 05/03/15   Automatic Reconciliation, Ar   calcium  carb-cholecalciferol 600-10 MG-MCG TABS per tab take 1 tablet by mouth  twice a day 09/18/16   Automatic Reconciliation, Ar   fluticasone furoate-vilanterol (BREO ELLIPTA) 200-25 MCG/ACT AEPB inhaler INHALE 1 PUFF BY MOUTH DAILY  Patient not taking: Reported on 06/02/2022 08/19/18   Automatic Reconciliation, Ar   furosemide  (LASIX ) 40 MG tablet Take 1 tablet by mouth daily 12/16/19   Automatic Reconciliation, Ar   lisinopril (PRINIVIL;ZESTRIL) 10 MG tablet TAKE 1 TABLET EVERY DAY  Patient not taking: Reported on 06/02/2022 10/30/17   Automatic Reconciliation, Ar   metFORMIN (GLUCOPHAGE) 1000 MG tablet TAKE 1 TABLET TWICE DAILY WITH MEALS 09/13/18   Automatic Reconciliation, Ar   methIMAzole  (TAPAZOLE ) 5 MG tablet TAKE 1 TABLET EVERY DAY 08/16/18   Automatic Reconciliation, Ar   metoprolol  tartrate (LOPRESSOR ) 50 MG tablet TAKE 1 TABLET TWICE DAILY 09/23/18   Automatic Reconciliation, Ar   pantoprazole  (PROTONIX ) 40 MG tablet Take 1 tablet by mouth daily 04/23/18   Automatic Reconciliation, Ar   rosuvastatin  (CRESTOR ) 20 MG tablet TAKE 1 TABLET EVERY NIGHT 09/13/18   Automatic Reconciliation, Ar   triamterene -hydroCHLOROthiazide  (DYAZIDE ) 37.5-25 MG per capsule TAKE 1 CAPSULE EVERY DAY 10/30/17   Automatic Reconciliation, Ar     Allergies   Allergen Reactions    Almond Oil Anaphylaxis, Hives and Itching    Macadamia Nut Oil Anaphylaxis    Other Anaphylaxis     All tree nuts       Family History   Problem Relation Age of Onset    Heart Disease Mother         rheumatic fever    Diabetes Mother     Cancer Brother         throat    Breast Cancer Sister     Cancer Sister         colon & breast    Breast Cancer Mother     Cancer Father         lung      Social History:  reports that she quit smoking about 32 years ago. Her smoking use included cigarettes. She has never used smokeless tobacco. She reports that she does not drink alcohol and does not use drugs.   Social Drivers of Health     Tobacco Use: Medium Risk (05/22/2023)    Received from [facility]    Patient History     Smoking Tobacco Use:  Former     Smokeless Tobacco Use: Never     Passive Exposure: Not on file   Alcohol Use: Not on file   Financial Resource Strain: Low Risk  (04/21/2021)    Received from [facility]    Overall Financial Resource Strain (CARDIA)  Difficulty of Paying Living Expenses: Not hard at all   Food Insecurity: No Food Insecurity (09/21/2022)    Received from [facility]    Hunger Vital Sign     Within the past 12 months, you worried that your food would run out before you got the money to buy more.: Never true     Within the past 12 months, the food you bought just didn't last and you didn't have money to get more.: Never true   Transportation Needs: No Transportation Needs (09/21/2022)    Received from [facility]     Lack of Transportation (Medical): No     Lack of Transportation (Non-Medical): No   Physical Activity: Not on file   Stress: Not on file   Social Connections: Not on file   Intimate Partner Violence: Not on file   Depression: Not at risk (07/19/2023)    Received from [facility]    PHQ-2     Patient Health Questionnaire-2 Score: 0   Housing Stability: Low Risk  (09/21/2022)    Received from [facility] Stability Vital Sign     In the last 12 months, was there a time when you were not able to pay the mortgage or rent on time?: No     In the past 12 months, how many times have you moved where you were living?: 0     At any time in the past 12 months, were you homeless or living in a shelter (including now)?: No   Interpersonal Safety: Not At Risk (06/02/2022)    Interpersonal Safety [facility] HRSN)     How often does anyone, including family and friends, physically hurt you?: Never     How often does anyone, including family and friends, scream or curse at you?: Not on file     How often does anyone, including family and friends, insult or talk down to you?: Not on file     How often does anyone, including family and friends, threaten you with harm?: Not on file   Utilities: Not At Risk  (09/21/2022)    Received from [facility] Util[facility]     Threatened with loss of utilities: No        Medications were reconciled to the best of my ability given all available resources at the time of admission. Route is PO if not otherwise noted.     Family and social history were personally reviewed, all pertinent and relevant details are outlined as above.    Objective:   BP 97/79   Pulse 85   Temp 98.2 F (36.8 C) (Oral)   Resp 20   Ht 1.613 m (5' 3.5)   Wt (!) 140.8 kg (310 lb 6.5 oz)   SpO2 90%   BMI 54.12 kg/m         PHYSICAL EXAM:   General: Alert x oriented x 3, awake, no acute distress,   HEENT: PEERL, EOMI, moist mucus membranes  Neck: Supple, no JVD, no meningeal signs  Chest: Clear to auscultation bilaterally, fair air movement  CVS: RRR, S1 S2 heard, no murmurs/rubs/gallops  Abd: Soft, non-tender, non-distended, +bowel sounds   Ext: No clubbing, no cyanosis, 2 edema R>L  Neuro/Psych: Pleasant mood and affect, CN 2-12 grossly intact, sensory grossly within normal limit, Strength 5/5 in all extremities, DTR 1+ x 4  Cap refill: Brisk, less than 3 seconds  Pulses: 2+, symmetric  in all extremities  Skin: Warm, dry, without rashes or lesions    Data Review:   I have independently reviewed and interpreted patient's lab and all other diagnostic data    Abnormal Labs Reviewed   COMPREHENSIVE METABOLIC PANEL - Abnormal; Notable for the following components:       Result Value    CO2 19 (*)     Anion Gap 17 (*)     Glucose 126 (*)     Creatinine 1.12 (*)     Est, Glom Filt Rate 49 (*)     Total Bilirubin 4.3 (*)     ALT <5 (*)     Albumin /Globulin Ratio 0.9 (*)     All other components within normal limits   BRAIN NATRIURETIC PEPTIDE - Abnormal; Notable for the following components:    NT Pro-BNP 7,990 (*)     All other components within normal limits   URINALYSIS WITH MICROSCOPIC - Abnormal; Notable for the following components:    Protein, UA 100 (*)     Blood, Urine MODERATE (*)     Nitrite,  Urine Positive (*)     Leukocyte Esterase, Urine SMALL (*)     All other components within normal limits   CBC WITH AUTO DIFFERENTIAL - Abnormal; Notable for the following components:    RDW 18.8 (*)     Platelets 116 (*)     Nucleated RBCs 1.4 (*)     nRBC 0.06 (*)     All other components within normal limits   TROPONIN - Abnormal; Notable for the following components:    Troponin T 25.5 (*)     All other components within normal limits       @MICRORESULTS @    IMAGING:   XR CHEST PORTABLE   Final Result   No acute cardiopulmonary process. Unchanged cardiomegaly and   evidence of enlarged thyroid gland.         Electronically signed by Wendelyn JINNY Hoops           ECG/ECHO:  @LASTCARDIOLOGY @       Notes reviewed from all clinical/nonclinical/nursing services involved in patient's clinical care. Care coordination discussions were held with appropriate clinical/nonclinical/ nursing providers based on care coordination needs.     Assessment:   Given the patient's current clinical presentation, there is a high level of concern for decompensation if discharged from the emergency department. Complex decision making was performed, which includes reviewing the patient's available past medical records, laboratory results, and imaging studies.    Principal Problem:    Atrial fibrillation with rapid ventricular response (HCC)  Resolved Problems:    * No resolved hospital problems. *      Plan:     New atrial fibrillation with rapid ventricular response: Pt endorses intermittent palpitations at home for some time  - CXR no acute cardiopulmonary process. Unchanged cardiomegaly and evidence of enlarged thyroid gland.  - consider CTA chest when cardiac issues stable to exclude PE as source for hypoxia and afib  - Check echo  - start therapeutic lovenox   - con't diltiazem  gtt  - TSH in AM  - Cardiology consulted    COPD without acute exacerbation, ?acute vs chronic hypoxia:  - CXR without acute findings as above  - consider CTA chest  as above  - on wixela at home, budesonide  nebs ordered  - needs mobilization/PT eval when cardiac issues stable    DM2 controlled:  - con't metformin  - lispro sliding scale  -  check A1c in AM    Essential HTN:  - holding lisinopril, triameterene/HCT due to borderline BP on diltiazem  gtt  - con't home metoprolol  with hold parameters    Hyperthyroidism:  - check TSH/T4 as above  - con't methimazole     Hyperlipidemia: con't rosuvastatin     Lymphedema with increased LE swelling (?due to CHF):  - change furosemide  to IV for now    Class III obesity    DIET: No diet orders on file   ISOLATION PRECAUTIONS: No active isolations  CODE STATUS: Prior   Central Line:     DVT PROPHYLAXIS: Lovenox   FUNCTIONAL STATUS PRIOR TO HOSPITALIZATION: Ambulatory and capable of self-care but relies on assistive devices (rolling walker/cane).   Ambulatory status/function: By self   EARLY MOBILITY ASSESSMENT: Recommend a consult to the Mobility Team  ANTICIPATED DISCHARGE: Greater than 48 hours.  ANTICIPATED DISPOSITION: Home with Home Healthcare  EMERGENCY CONTACT/SURROGATE DECISION MAKER: sons would be decision makers if needed    CRITICAL CARE WAS PERFORMED FOR THIS ENCOUNTER: NO.      Signed By: Jenkins Lee, MD     October 21, 2023         Please note that this dictation may have been completed with Dragon, the computer voice recognition software.  Quite often unanticipated grammatical, syntax, homophones, and other interpretive errors are inadvertently transcribed by the computer software.  Please disregard these errors.  Please excuse any errors that have escaped final proofreading.

## 2023-10-21 NOTE — Progress Notes (Signed)
 RT was called to ER regarding (Midflow), per   patient's assessment, SATs were 92% on 5/6 liters. Patient's Pm hx of COPD, informed RN/RT does not wear baseline home oxygen, but has it.No respiratory distress present at this time, (midflow) was not placed on patient, was not indicated.

## 2023-10-21 NOTE — ED Triage Notes (Signed)
 Pt arrives from home via EMS for increased swelling in legs and fluid overload. Pt states worsening swelling over 3-4 days. Denies SOB. Hx COPD, uses oxygen at home as needed.

## 2023-10-21 NOTE — ED Notes (Signed)
 Patients pure wick failed she was cleaned up with new sheets and blankets and new pure wick was placed

## 2023-10-22 ENCOUNTER — Inpatient Hospital Stay: Payer: Medicare (Managed Care) | Primary: Geriatric Medicine

## 2023-10-22 ENCOUNTER — Inpatient Hospital Stay: Admit: 2023-10-22 | Payer: Medicare (Managed Care) | Primary: Geriatric Medicine

## 2023-10-22 LAB — ECHO (TTE) COMPLETE (PRN CONTRAST/BUBBLE/STRAIN/3D)
AV Area by Peak Velocity: 2.3 cm2
AV Area by VTI: 2.1 cm2
AV Mean Gradient: 4 mmHg
AV Mean Velocity: 1 m/s
AV Peak Gradient: 7 mmHg
AV Peak Velocity: 1.4 m/s
AV VTI: 27.3 cm
AV Velocity Ratio: 0.64
AVA/BSA Peak Velocity: 1 cm2/m2
AVA/BSA VTI: 0.9 cm2/m2
Ao Root Index: 1.34 cm/m2
Aortic Root: 3.1 cm
Body Surface Area: 2.51 m2
EF Physician: 60 %
Est. RA Pressure: 15 mmHg
Fractional Shortening 2D: 31 % (ref 28–44)
IVSd: 1.1 cm — AB (ref 0.6–0.9)
LV Mass 2D Index: 68 g/m2 (ref 43–95)
LV Mass 2D: 157.1 g (ref 67–162)
LV RWT Ratio: 0.52
LVIDd Index: 1.82 cm/m2
LVIDd: 4.2 cm (ref 3.9–5.3)
LVIDs Index: 1.26 cm/m2
LVIDs: 2.9 cm
LVOT Area: 3.1 cm2
LVOT Diameter: 2 cm
LVOT Mean Gradient: 2 mmHg
LVOT Peak Gradient: 4 mmHg
LVOT Peak Velocity: 0.9 m/s
LVOT SV: 54.6 mL
LVOT Stroke Volume Index: 23.7 mL/m2
LVOT VTI: 17.4 cm
LVOT:AV VTI Index: 0.64
LVPWd: 1.1 cm — AB (ref 0.6–0.9)
MV A Velocity: 0.99 m/s
MV Area by PHT: 2.9 cm2
MV Area by VTI: 1.7 cm2
MV E Velocity: 0.65 m/s
MV E Wave Deceleration Time: 259.8 ms
MV E/A: 0.66
MV Max Velocity: 1.1 m/s
MV Mean Gradient: 2 mmHg
MV Mean Velocity: 0.6 m/s
MV PHT: 75.3 ms
MV Peak Gradient: 5 mmHg
MV VTI: 31.5 cm
MV:LVOT VTI Index: 1.81
PV Max Velocity: 0.8 m/s
PV Peak Gradient: 3 mmHg
RA Volume: 174 mL
RA Volume: 186 mL
RVIDd: 5.8 cm
RVSP: 72 mmHg
TAPSE: 1.4 cm — AB (ref 1.7–?)
TR Max Velocity: 3.78 m/s
TR Peak Gradient: 57 mmHg

## 2023-10-22 LAB — CBC WITH AUTO DIFFERENTIAL
Basophils %: 0.8 % (ref 0.0–1.0)
Basophils Absolute: 0.03 K/UL (ref 0.00–0.10)
Eosinophils %: 2.7 % (ref 0.0–7.0)
Eosinophils Absolute: 0.1 K/UL (ref 0.00–0.40)
Hematocrit: 42.4 % (ref 35.0–47.0)
Hemoglobin: 13.9 g/dL (ref 11.5–16.0)
Immature Granulocytes %: 0.3 % (ref 0.0–0.5)
Immature Granulocytes Absolute: 0.01 K/UL (ref 0.00–0.04)
Lymphocytes %: 23.5 % (ref 12.0–49.0)
Lymphocytes Absolute: 0.87 K/UL (ref 0.80–3.50)
MCH: 32 pg (ref 26.0–34.0)
MCHC: 32.8 g/dL (ref 30.0–36.5)
MCV: 97.7 FL (ref 80.0–99.0)
Monocytes %: 10.5 % (ref 5.0–13.0)
Monocytes Absolute: 0.39 K/UL (ref 0.00–1.00)
Neutrophils %: 62.2 % (ref 32.0–75.0)
Neutrophils Absolute: 2.3 K/UL (ref 1.80–8.00)
Nucleated RBCs: 1.1 /100{WBCs} — ABNORMAL HIGH
Platelets: 94 K/uL — ABNORMAL LOW (ref 150–400)
RBC: 4.34 M/uL (ref 3.80–5.20)
RDW: 19.1 % — ABNORMAL HIGH (ref 11.5–14.5)
WBC: 3.7 K/uL (ref 3.6–11.0)
nRBC: 0.04 K/uL — ABNORMAL HIGH (ref 0.00–0.01)

## 2023-10-22 LAB — TSH + FREE T4 PANEL
T4 Free: 1.5 ng/dL (ref 0.9–1.6)
TSH, 3rd Generation: 1.75 u[IU]/mL (ref 0.270–4.200)

## 2023-10-22 LAB — BASIC METABOLIC PANEL
Anion Gap: 17 mmol/L — ABNORMAL HIGH (ref 2–14)
BUN/Creatinine Ratio: 17 (ref 12–20)
BUN: 18 mg/dL (ref 8–23)
CO2: 19 mmol/L — ABNORMAL LOW (ref 20–29)
Calcium: 8.7 mg/dL — ABNORMAL LOW (ref 8.8–10.2)
Chloride: 106 mmol/L (ref 98–107)
Creatinine: 1.02 mg/dL — ABNORMAL HIGH (ref 0.60–1.00)
Est, Glom Filt Rate: 55 ml/min/1.73m2 — ABNORMAL LOW (ref >90)
Glucose: 111 mg/dL — ABNORMAL HIGH (ref 65–100)
Potassium: 3.2 mmol/L — ABNORMAL LOW (ref 3.5–5.1)
Sodium: 142 mmol/L (ref 136–145)

## 2023-10-22 LAB — PROTIME-INR
INR: 1.5 — ABNORMAL HIGH (ref 0.9–1.1)
Protime: 15.4 s — ABNORMAL HIGH (ref 9.2–11.2)

## 2023-10-22 LAB — HEMOGLOBIN A1C
Estimated Avg Glucose: 141 mg/dL
Hemoglobin A1C: 6.5 % — ABNORMAL HIGH (ref 4.0–5.6)

## 2023-10-22 LAB — MAGNESIUM: Magnesium: 1.7 mg/dL (ref 1.6–2.4)

## 2023-10-22 MED ORDER — POTASSIUM CHLORIDE ER 10 MEQ PO TBCR
10 | Freq: Two times a day (BID) | ORAL | Status: AC
Start: 2023-10-22 — End: 2023-10-22
  Administered 2023-10-22 – 2023-10-23 (×2): 40 meq via ORAL

## 2023-10-22 MED ORDER — RIVAROXABAN 20 MG PO TABS
20 | Freq: Every day | ORAL | Status: AC
Start: 2023-10-22 — End: ?
  Administered 2023-10-22 – 2023-10-25 (×4): 20 mg via ORAL

## 2023-10-22 MED ORDER — FUROSEMIDE 10 MG/ML IJ SOLN
10 | Freq: Two times a day (BID) | INTRAMUSCULAR | Status: AC
Start: 2023-10-22 — End: ?
  Administered 2023-10-22 – 2023-10-23 (×2): 40 mg via INTRAVENOUS

## 2023-10-22 MED ORDER — MICONAZOLE NITRATE 2 % EX OINT
2 | Freq: Two times a day (BID) | CUTANEOUS | Status: DC
Start: 2023-10-22 — End: 2023-10-25
  Administered 2023-10-23 – 2023-10-25 (×6): via TOPICAL

## 2023-10-22 MED ORDER — BALSAM PERU-CASTOR OIL TOPICAL OINTMENT
Freq: Two times a day (BID) | CUTANEOUS | Status: DC
Start: 2023-10-22 — End: 2023-10-25
  Administered 2023-10-23 – 2023-10-25 (×6): via TOPICAL

## 2023-10-22 MED FILL — POTASSIUM CHLORIDE ER 10 MEQ PO TBCR: 10 meq | ORAL | Qty: 4

## 2023-10-22 MED FILL — BUDESONIDE 0.25 MG/2ML IN SUSP: 0.25 MG/2ML | RESPIRATORY_TRACT | Qty: 2 | Fill #0

## 2023-10-22 MED FILL — FUROSEMIDE 10 MG/ML IJ SOLN: 10 mg/mL | INTRAMUSCULAR | Qty: 2 | Fill #0

## 2023-10-22 MED FILL — BPCO EX OINT: CUTANEOUS | Qty: 60

## 2023-10-22 MED FILL — XARELTO 20 MG PO TABS: 20 mg | ORAL | Qty: 1

## 2023-10-22 MED FILL — LOVENOX 150 MG/ML IJ SOSY: 150 mg/mL | INTRAMUSCULAR | Qty: 1 | Fill #0

## 2023-10-22 MED FILL — METHIMAZOLE 5 MG PO TABS: 5 mg | ORAL | Qty: 1 | Fill #0

## 2023-10-22 MED FILL — METOPROLOL TARTRATE 25 MG PO TABS: 25 mg | ORAL | Qty: 1 | Fill #0

## 2023-10-22 MED FILL — ROSUVASTATIN CALCIUM 10 MG PO TABS: 10 mg | ORAL | Qty: 2 | Fill #0

## 2023-10-22 MED FILL — REMEDY PHYTOPLEX ANTIFUNGAL 2 % EX OINT: 2 % | CUTANEOUS | Qty: 71

## 2023-10-22 MED FILL — PULMICORT 0.25 MG/2ML IN SUSP: 0.25 MG/2ML | RESPIRATORY_TRACT | Qty: 8 | Fill #0

## 2023-10-22 MED FILL — FUROSEMIDE 10 MG/ML IJ SOLN: 10 mg/mL | INTRAMUSCULAR | Qty: 4

## 2023-10-22 MED FILL — ACETAMINOPHEN 325 MG PO TABS: 325 mg | ORAL | Qty: 2 | Fill #0

## 2023-10-22 NOTE — Care Coordination-Inpatient (Signed)
 Care Management Initial Assessment       RUR:15%  Readmission? No  1st IM letter given? Yes   1st [employer] letter given: No      10/22/23 1222   Service Assessment   Patient Orientation Alert and Oriented   Cognition Alert   History Provided By Patient   Primary Caregiver Self   Support Systems Family Members;Children   PCP Verified by CM Yes   Last Visit to PCP Within last 3 months   Prior Functional Level Assistance with the following:;Mobility   Current Functional Level Other (see comment)  (TBD)   Can patient return to prior living arrangement Yes   Ability to make needs known: Good   Family able to assist with home care needs: Yes   Would you like for me to discuss the discharge plan with any other family members/significant others, and if so, who? No   Financial Resources None     CM met with this pt at her bedside to introduce her to the role of CM and transition of care. This pt, her brother (3) and grandson (24) live in a 3 story home with no steps to enter from the back of the home. She stated that she resides on that level and does not have to go up and down steps. She has her bedroom and half bath on that level as well as the family room and kitchen. This pt has a walker, WC, cane and home oxygen (concentrator and portable tanks). She does not know which agency provides the O2.     At baseline, this pt uses her walker for ambulation. She takes bird baths in her half bath. She has no history of home health, SNF or IPR. She was going to a Lymphedema Clinic, but stopped. She stated that she is on a list for the [facility].CM will follow this pt and assess for any d/c needs. Rock Lary Eckardt,BSW,ACM-SW

## 2023-10-22 NOTE — Consults (Signed)
 [facility] Consultation    Date of Consult:  10/22/23  Date of Admission: 10/21/2023  Primary Cardiologist: none  Physician Requesting consult: Dr. Jenkins Lee    Assessment/Recommendations:  Atrial fibrillation with RVR  New diagnosis   Self converted to NSR this morning  Continue metoprolol   Can stop diltiazem  infusion  Echo pending  CHA2DSVASc score 6 suggesting high cardioembolic risk. Discussed risks/benefits of anticoagulation. Continue enoxaparin  1 mg/kg BID for now. Switch to Xarelto  prior to discharge. Will need to monitor thrombocytopenia. No signs of bleeding currently  Acute on chronic hypoxic respiratory failure. Has h/o COPD. Modestly elevated BNP, may have component of HF as well  Increase furosemide  to 40 mg BID  Echo pending  COPD on home oxygen  Thrombocytopenia  Work up primary team  HTN  DM II  Morbid obesity  Lymphedema      Discussed with RN    Thank you for the opportunity to participate in the care of Allison Newman and please do not hesitate to contact us  should you have any questions.      Chief Complaint / Reason for Consult:   AF    History of Present Illness:  From H&P:  Allison Newman is a 83 y.o. female who presents with above.  Patient came to the emergency room today for evaluation of 2 to 3 weeks of increasing edema.  She also had worsening weakness and was not able to get up out of bed, so finally agreed to let her son have her brought to the emergency room.  Patient at baseline lives at home with a different son and is responsible for her own medical care.  She does have diabetes and lymphedema, no longer following with a lymphedema clinic.  Patient says that she noticed over the last few weeks that her swelling in her legs has been getting worse.  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  Patient has oxygen at home that she uses sometimes and cannot really describe when she is supposed  to be using oxygen.  Patient denies any cardiac history including CAD, CHF or arrhythmia.  Patient does state that she has intermittently had palpitations over the last year but none recently.  Patient denies chest pain.      She denies any chest pain or palpitations. Dyspnea is stable.   Denies prior cardiac history. NO h/o AF, CAD/MI or HF.   No prior bleeding. She was on anticoagulation several years ago for PE.    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    Diabetes (HCC)     Elevated BUN 09/13/2011    Finger lesion 03/02/2016    Gallbladder polyp 04/08/2013    Gastrointestinal disorder     GERD    GERD (gastroesophageal reflux disease)     Hypercholesterolemia     Hypertension     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 Authorizing Provider   fluticasone-salmeterol (ADVAIR) 100-50 MCG/ACT AEPB diskus inhaler Inhale 1 puff into the lungs in the morning and 1 puff in the evening.   Yes [provider]   Lancets MISC Use BID. Dx: E11.9 03/27/17   Automatic Reconciliation, Ar   furosemide  (LASIX ) 40 MG tablet Take 1  tablet by mouth daily 12/16/19   Automatic Reconciliation, Ar   lisinopril (PRINIVIL;ZESTRIL) 10 MG tablet TAKE 1 TABLET EVERY DAY  Patient not taking: Reported on 10/22/2023 10/30/17   Automatic Reconciliation, Ar   metFORMIN (GLUCOPHAGE) 1000 MG tablet TAKE 1 TABLET TWICE DAILY WITH MEALS 09/13/18   Automatic Reconciliation, Ar   methIMAzole  (TAPAZOLE ) 5 MG tablet TAKE 1 TABLET EVERY DAY 08/16/18   Automatic Reconciliation, Ar   metoprolol  tartrate (LOPRESSOR ) 50 MG tablet TAKE 1 TABLET TWICE DAILY 09/23/18   Automatic Reconciliation, Ar   rosuvastatin  (CRESTOR ) 20 MG tablet TAKE 1 TABLET EVERY NIGHT 09/13/18   Automatic Reconciliation, Ar   triamterene -hydroCHLOROthiazide  (DYAZIDE ) 37.5-25 MG per capsule TAKE 1 CAPSULE EVERY DAY 10/30/17   Automatic Reconciliation, Ar       Current Facility-Administered Medications   Medication Dose Route Frequency    potassium chloride  (KLOR-CON ) extended release tablet 40 mEq  40 mEq Oral BID    dilTIAZem  125 mg in sodium chloride  0.9 % 125 mL infusion  2.5-15 mg/hr IntraVENous Continuous    sodium chloride  flush 0.9 % injection 5-40 mL  5-40 mL IntraVENous 2 times per day    sodium chloride  flush 0.9 % injection 5-40 mL  5-40 mL IntraVENous PRN    0.9 % sodium chloride  infusion   IntraVENous PRN    potassium chloride  (KLOR-CON ) extended release tablet 40 mEq  40 mEq Oral PRN    Or    potassium bicarb-citric acid  (EFFER-K) effervescent tablet 40 mEq  40 mEq Oral PRN    Or    potassium chloride  10 mEq/100 mL IVPB (Peripheral Line)  10 mEq IntraVENous PRN    magnesium  sulfate 2000 mg in 50 mL IVPB premix  2,000 mg IntraVENous PRN    ondansetron  (ZOFRAN -ODT) disintegrating tablet 4 mg  4 mg Oral Q8H PRN    Or    ondansetron  (ZOFRAN ) injection 4 mg  4 mg IntraVENous Q6H PRN    polyethylene glycol (GLYCOLAX ) packet 17 g  17 g Oral Daily PRN    acetaminophen  (TYLENOL ) tablet 650 mg  650 mg Oral Q6H PRN    Or    acetaminophen  (TYLENOL ) suppository 650 mg  650 mg Rectal Q6H PRN    enoxaparin   (LOVENOX ) injection 135 mg  1 mg/kg SubCUTAneous BID    methIMAzole  (TAPAZOLE ) tablet 5 mg  5 mg Oral Daily    rosuvastatin  (CRESTOR ) tablet 20 mg  20 mg Oral Nightly    furosemide  (LASIX ) injection 20 mg  20 mg IntraVENous BID AC    budesonide  (PULMICORT ) nebulizer suspension 250 mcg  0.25 mg Nebulization BID  RT    metoprolol  tartrate (LOPRESSOR ) tablet 25 mg  25 mg Oral BID       Family History   Problem Relation Age of Onset    Heart Disease Mother         rheumatic fever    Diabetes Mother     Cancer Brother         throat    Breast Cancer Sister     Cancer Sister         colon & breast    Breast Cancer Mother     Cancer Father         lung       Social History     Socioeconomic History    Marital status: Divorced     Spouse name: Not on file    Number of children: Not on file    Years of education: Not on file    Highest education level: Not on file   Occupational History    Not on file   Tobacco Use    Smoking status: Former     Current packs/day: 0.00     Types: Cigarettes     Quit date: 06/03/1991     Years since quitting: 32.4    Smokeless tobacco: Never   Substance and Sexual Activity    Alcohol use: No     Alcohol/week: 0.0 standard drinks of alcohol    Drug use: No    Sexual activity: Not on file   Other Topics Concern    Not on file   Social History Narrative    Not on file     Social Drivers of Health     Financial Resource Strain: Low Risk  (04/21/2021)    Received from [facility]    Overall Financial Resource Strain (CARDIA)     Difficulty of Paying Living Expenses: Not hard at all   Food Insecurity: No Food Insecurity (10/22/2023)    Hunger Vital Sign     Worried About Running Out of Food in the Last Year: Never true     Ran Out of Food in the Last Year: Never true   Transportation Needs: No Transportation Needs (10/22/2023)    PRAPARE - Therapist, art (Medical): No     Lack of Transportation (Non-Medical): No   Physical Activity: Not on file   Stress: Not on file   Social  Connections: Not on file   Intimate Partner Violence: Not on file   Housing Stability: Low Risk  (10/22/2023)    Housing Stability Vital Sign     Unable to Pay for Housing in the Last Year: No     Number of Times Moved in the Last Year: 0     Homeless in the Last Year: No       Review of Systems    ROS: All other systems reviewed and were negative other than mentioned above.     BP (!) 104/56   Pulse 62   Temp 98.3 F (36.8 C) (Oral)   Resp 19   Ht 1.613 m (5' 3.5)   Wt (!) 136.2 kg (300 lb 4.3 oz)   SpO2 92%   BMI 52.36 kg/m       Intake/Output Summary (Last 24 hours) at 10/22/2023 1230  Last data filed at 10/22/2023 0043  Gross per 24 hour   Intake --   Output 1100 ml   Net -1100 ml  General: obese  HEENT: sclera anicteric, moist mucous membranes  Neck: supple, no JVD   CV: regular rate and rhythm, distant heart sounds, no murmurs, rubs or gallops,  Lungs: no respiratory distress, lungs clear to auscultation   Abdomen: soft, non distended  MSK: chornic lymphedema with superimprosed piting edema bilaterally   Skin: warm to touch  Neuro: alert and oriented, answered questions appropriately  Psych: normal mood and affect     Lab Review:  CBC:  Recent Labs     10/22/23  0416   WBC 3.7   RBC 4.34   HGB 13.9   HCT 42.4   MCV 97.7   MCH 32.0   MCHC 32.8   RDW 19.1*   PLT 94*       CMP:    Recent Labs     10/21/23  1416 10/22/23  0416   NA 143 142   K 3.7 3.2*   CL 107 106   CO2 19* 19*   BUN 17 18   CREATININE 1.12* 1.02*   GLUCOSE 126* 111*   CALCIUM  9.2 8.7*   BILITOT 4.3*  --    ALKPHOS 63  --    AST 21  --    ALT <5*  --            All Cardiac Markers in the last 24 hours:     Latest Reference Range & Units 10/21/23 14:16 10/21/23 15:05   Troponin T 0 - 14 ng/L  25.5 (H)   NT Pro-BNP 0 - 450 PG/ML 7,990 (H)    (H): Data is abnormally high      Data Review:  EKG independently reviewed: AF, vrate 91, NSSTT changes    Echocardiogram: pending    07/2017   Normal right ventricular cavity size and global RV  systolic function.   Left Ventricle: Mildly increased wall thickness. Estimated left ventricular ejection fraction is 61 - 65%.   Left Atrium: Mildly dilated left atrium.   Aortic Valve: Mild aortic valve sclerosis.      Other imaging:   Chest xray independently reviewed:   No acute cardiopulmonary process. Unchanged cardiomegaly and  evidence of enlarged thyroid gland.      Signed:  Maggie Dworkin T. Maree, MD  General Cardiology    [facility]  10/22/23

## 2023-10-22 NOTE — Plan of Care (Signed)
 Problem: Safety - Adult  Goal: Free from fall injury  10/22/2023 1013 by Jamee Korene CROME, RN  Outcome: Progressing  Flowsheets (Taken 10/22/2023 1013)  Free From Fall Injury: Instruct family/caregiver on patient safety     Problem: Pain  Goal: Verbalizes/displays adequate comfort level or baseline comfort level  10/22/2023 1013 by Stokes-Byrd, Korene CROME, RN  Outcome: Progressing  Flowsheets (Taken 10/22/2023 1013)  Verbalizes/displays adequate comfort level or baseline comfort level:   Encourage patient to monitor pain and request assistance   Assess pain using appropriate pain scale   Administer analgesics based on type and severity of pain and evaluate response

## 2023-10-22 NOTE — ED Notes (Signed)
 Report called to [address] west.

## 2023-10-22 NOTE — Progress Notes (Signed)
 [facility]                                                                                          Hospitalist Progress Note  [name], MD  Office Phone: [phone]        Date of Service:  10/22/2023  NAME:  Allison Newman  DOB:  03-03-1941  MRN:  [id number]       Admission Summary:   83 y.o. female who presents with above.  Patient came to the emergency room today for evaluation of 2 to 3 weeks of increasing edema.  She also had worsening weakness and was not able to get up out of bed, so finally agreed to let her son have her brought to the emergency room.  Patient at baseline lives at home with a different son and is responsible for her own medical care.  She does have diabetes and lymphedema, no longer following with a lymphedema clinic.  Patient says that she noticed over the last few weeks that her swelling in her legs has been getting worse.  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  Patient has oxygen at home that she uses sometimes and cannot really describe when she is supposed to be using oxygen.  Patient denies any cardiac history including CAD, CHF or arrhythmia.  Patient does state that she has intermittently had palpitations over the last year but none recently.  Patient denies chest pain.         Interval history / Subjective:   Denies CP, breathing a little better, edema unchanged (chronic lymphedema worsened)     Assessment & Plan:     New atrial fibrillation with rapid ventricular response:   - diltiazem  drip titrate off  - continue PO metoprolol   - TTE   - anticoagulation with sc enoxaparin   - cardiology consulted, IV furosemide  to BID    COPD without acute exacerbation, ?acute vs chronic hypoxia:  - improved with current meds    Type 2 DM:  - SSI for now     Essential HTN:  - holding lisinopril, triameterene/HCT due to borderline BP on diltiazem  gtt  -  con't home metoprolol  with hold parameters     Hyperthyroidism:  - con't methimazole      Hyperlipidemia: con't rosuvastatin      Lymphedema with increased LE swelling (?due to CHF):  - needs lymphedema clinic    Class III obesity          Code status: full  Prophylaxis: anticoagulated  Care Plan discussed with: pt rn cm  Anticipated Disposition:   Inpatient  Cardiac monitoring: Telemetry  Central Line:       CRITICAL CARE ATTESTATION:  I had a face to face encounter with the patient, reviewed and interpreted patient data including clinical events, labs, images, vital signs, I/O's, and examined patient.  I have discussed the case and the plan and management of the patient's care with the consulting services, the bedside nurses and necessary ancillary providers.  NOTE OF PERSONAL INVOLVEMENT IN CARE   This patient has a high probability of imminent, clinically significant deterioration, which requires the highest level of preparedness to intervene urgently. I participated in the decision-making and personally managed or directed the management of the following life and organ supporting interventions that required my frequent assessment to treat or prevent imminent deterioration.     I personally spent 35 minutes of critical care time.  This is time spent at this critically ill patient's bedside actively involved in patient care as well as the coordination of care and discussions with the patient's family.  This does not include any procedural time which has been billed separately.           Social Drivers of Health     Tobacco Use: Medium Risk (05/22/2023)    Received from [facility]    Patient History     Smoking Tobacco Use: Former     Smokeless Tobacco Use: Never     Passive Exposure: Not on file   Alcohol Use: Not on file   Financial Resource Strain: Low Risk  (04/21/2021)    Received from [facility]    Overall Financial Resource Strain (CARDIA)     Difficulty of Paying Living Expenses: Not hard at all   Food  Insecurity: No Food Insecurity (10/22/2023)    Hunger Vital Sign     Worried About Running Out of Food in the Last Year: Never true     Ran Out of Food in the Last Year: Never true   Transportation Needs: No Transportation Needs (10/22/2023)    PRAPARE - Therapist, art (Medical): No     Lack of Transportation (Non-Medical): No   Physical Activity: Not on file   Stress: Not on file   Social Connections: Not on file   Intimate Partner Violence: Not on file   Depression: Not at risk (07/19/2023)    Received from [facility]    PHQ-2     Patient Health Questionnaire-2 Score: 0   Housing Stability: Low Risk  (10/22/2023)    Housing Stability Vital Sign     Unable to Pay for Housing in the Last Year: No     Number of Times Moved in the Last Year: 0     Homeless in the Last Year: No   Interpersonal Safety: Not At Risk (10/22/2023)    Interpersonal Safety Domain Source: IP Abuse Screening     Physical abuse: Denies     Verbal abuse: Denies     Emotional abuse: Denies     Financial abuse: Denies     Sexual abuse: Denies   Utilities: Not At Risk (10/22/2023)    [facility] Utilities     Threatened with loss of utilities: No       Review of Systems:   A comprehensive review of systems was negative.       Vital Signs:    Last 24hrs VS reviewed since prior progress note. Most recent are:  Vitals:    10/22/23 1200   BP:    Pulse: 69   Resp:    Temp:    SpO2:          Intake/Output Summary (Last 24 hours) at 10/22/2023 1435  Last data filed at 10/22/2023 0043  Gross per 24 hour   Intake --   Output 1100 ml   Net -1100 ml        Physical Examination:     I  had a face to face encounter with this patient and independently examined them on 10/22/2023 as outlined below:          General : alert x 3, awake, no acute distress,   HEENT: PEERL, EOMI, moist mucus membrane  Neck: supple, no JVD, no meningeal signs  Chest: Clear to auscultation bilaterally   CVS: rr nl rate  Abd: soft/ non tender, non distended, BS physiological,    Ext: no clubbing, no cyanosis, ++lymphedema  Neuro/Psych: grossly non-focal  Skin: warm     Data Review:    Review and/or order of clinical lab test  Review and/or order of tests in the radiology section of CPT  Review and/or order of tests in the medicine section of CPT      I have personally and independently reviewed all pertinent labs, diagnostic studies, imaging, and have provided independent interpretation of the same.     Labs:     Recent Labs     10/21/23  1416 10/22/23  0416   WBC 4.3 3.7   HGB 14.5 13.9   HCT 43.1 42.4   PLT 116* 94*     Recent Labs     10/21/23  1416 10/22/23  0416   NA 143 142   K 3.7 3.2*   CL 107 106   CO2 19* 19*   BUN 17 18   MG  --  1.7     Recent Labs     10/21/23  1416   ALT <5*   GLOB 4.0     Recent Labs     10/22/23  0416   INR 1.5*      No results for input(s): TIBC in the last 72 hours.    Invalid input(s): FE, PSAT, FERR   No results found for: RBCF   No results for input(s): PH, PCO2, PO2 in the last 72 hours.  No results for input(s): CPK in the last 72 hours.    Invalid input(s): CPKMB, CKNDX, TROIQ  No results found for: CHOL, CHLST, CHOLV, HDL, HDLC, LDL, LDLC  Lab Results   Component Value Date/Time    GLUCPOC 237 01/15/2018 10:37 AM    GLUCPOC 174 10/12/2017 11:24 AM     @LABUA @    Notes reviewed from all clinical/nonclinical/nursing services involved in patient's clinical care. Care coordination discussions were held with appropriate clinical/nonclinical/ nursing providers based on care coordination needs.         Patients current active Medications were reviewed, considered, added and adjusted based on the clinical condition today.      Home Medications were reconciled to the best of my ability given all available resources at the time of admission. Route is PO if not otherwise noted.      Admission Status:[id number]:::1}      Medications Reviewed:     Current Facility-Administered Medications   Medication Dose Route Frequency     potassium chloride  (KLOR-CON ) extended release tablet 40 mEq  40 mEq Oral BID    miconazole  nitrate 2 % ointment   Topical BID    balsum peru-castor oil (VENELEX) ointment   Topical BID    furosemide  (LASIX ) injection 40 mg  40 mg IntraVENous BID AC    [Held by provider] dilTIAZem  125 mg in sodium chloride  0.9 % 125 mL infusion  2.5-15 mg/hr IntraVENous Continuous    sodium chloride  flush 0.9 % injection 5-40 mL  5-40 mL IntraVENous 2 times per day    sodium chloride  flush 0.9 %  injection 5-40 mL  5-40 mL IntraVENous PRN    0.9 % sodium chloride  infusion   IntraVENous PRN    potassium chloride  (KLOR-CON ) extended release tablet 40 mEq  40 mEq Oral PRN    Or    potassium bicarb-citric acid  (EFFER-K) effervescent tablet 40 mEq  40 mEq Oral PRN    Or    potassium chloride  10 mEq/100 mL IVPB (Peripheral Line)  10 mEq IntraVENous PRN    magnesium  sulfate 2000 mg in 50 mL IVPB premix  2,000 mg IntraVENous PRN    ondansetron  (ZOFRAN -ODT) disintegrating tablet 4 mg  4 mg Oral Q8H PRN    Or    ondansetron  (ZOFRAN ) injection 4 mg  4 mg IntraVENous Q6H PRN    polyethylene glycol (GLYCOLAX ) packet 17 g  17 g Oral Daily PRN    acetaminophen  (TYLENOL ) tablet 650 mg  650 mg Oral Q6H PRN    Or    acetaminophen  (TYLENOL ) suppository 650 mg  650 mg Rectal Q6H PRN    enoxaparin  (LOVENOX ) injection 135 mg  1 mg/kg SubCUTAneous BID    methIMAzole  (TAPAZOLE ) tablet 5 mg  5 mg Oral Daily    rosuvastatin  (CRESTOR ) tablet 20 mg  20 mg Oral Nightly    budesonide  (PULMICORT ) nebulizer suspension 250 mcg  0.25 mg Nebulization BID RT    metoprolol  tartrate (LOPRESSOR ) tablet 25 mg  25 mg Oral BID     ______________________________________________________________________  EXPECTED LENGTH OF STAY: 2  ACTUAL LENGTH OF STAY:          1                 Bena Kobel Al-Saadoon, MD

## 2023-10-22 NOTE — Wound Image (Addendum)
 WOCN Note:     New consult placed for  Sacrum and folds, excoriation   Assessed in 446/01 with Korene RN.    Allison Newman is a 83 y.o. y/o female who presented for Hypoxemia [R09.02]  Acute respiratory distress [R06.03]  Atrial fibrillation with rapid ventricular response (HCC) [I48.91]  Atrial fibrillation, unspecified type (HCC) [I48.91]  Admitted on 10/21/2023; LOS: 1    Lab Results   Component Value Date/Time    WBC 3.7 10/22/2023 04:16 AM    LABA1C 6.5 (H) 10/22/2023 04:16 AM    POCGLU 144 (H) 06/03/2022 03:40 PM    POCGLU 127 (H) 06/03/2022 11:12 AM    HGB 13.9 10/22/2023 04:16 AM    HCT 42.4 10/22/2023 04:16 AM    PLT 94 (L) 10/22/2023 04:16 AM        Lab Results   Component Value Date/Time    WBC 3.7 10/22/2023 04:16 AM        Tobacco Use      Smoking status: Former        Packs/day: 0.00        Types: Cigarettes        Quit date: 06/03/1991        Years since quitting: 32.4      Smokeless tobacco: Never       ADULT DIET; Regular; 4 carb choices (60 gm/meal)     Assessment:   Patient is A&O x 3, communicative and requires assist with repositioning.    Bed: stryker gel  GI/GU: external catheter  Patient reports no pain.    Patient repositioned on right side with pillow.  Heels offloaded with pillows.  Heels boggy with blanching erythema.     Sacrum and buttocks       Wound Assessment  POA Leg folds, Breast folds, abdominal fold, groin folds, perineum and gluteal cleft: red moist rash consistent with Candidiasis.    Wound, Pressure Prevention & Skin Care Recommendations:    Minimize layers of linen/pads under patient to optimize support surface.    2.  Turn/reposition approximately every 2 hours and offload heels.   3.  Manage moisture/ Keep skin folds clean and dry/minimize brief usage.  4.  Specialty bed: bariatric immerse low air loss ordered. Confirmation [id number]. Use only flat sheet and one incontinence pad.  5.  Leg folds, Back folds, breast folds, abdominal fold, groin folds, perineum and gluteal  cleft:  cleanse with Vashe, pat dry and apply BID Miconazole  ointment.  6.  Heels:  apply bid Venelex.    Discussed above plan with patient & Engineer, maintenance (IT).  Discussed finding with Dr Naaman.    Transition of Care:   Plan to follow weekly and as needed while admitted to hospital.    Shelba Sieving, BSN RN [facility]  [facility] Inpatient [facility]ound [facility]  Available on [employer]  Office [phone]

## 2023-10-22 NOTE — Plan of Care (Signed)
 Problem: Safety - Adult  Goal: Free from fall injury  Outcome: Progressing     Problem: Chronic Conditions and Co-morbidities  Goal: Patient's chronic conditions and co-morbidity symptoms are monitored and maintained or improved  Outcome: Progressing     Problem: Discharge Planning  Goal: Discharge to home or other facility with appropriate resources  Outcome: Progressing     Problem: Skin/Tissue Integrity  Goal: Skin integrity remains intact  Description: 1.  Monitor for areas of redness and/or skin breakdown  2.  Assess vascular access sites hourly  3.  Every 4-6 hours minimum:  Change oxygen saturation probe site  4.  Every 4-6 hours:  If on nasal continuous positive airway pressure, respiratory therapy assess nares and determine need for appliance change or resting period  Outcome: Progressing     Problem: Pain  Goal: Verbalizes/displays adequate comfort level or baseline comfort level  Outcome: Progressing

## 2023-10-23 ENCOUNTER — Inpatient Hospital Stay: Payer: Medicare (Managed Care) | Primary: Geriatric Medicine

## 2023-10-23 ENCOUNTER — Inpatient Hospital Stay: Admit: 2023-10-23 | Payer: Medicare (Managed Care) | Primary: Geriatric Medicine

## 2023-10-23 ENCOUNTER — Ambulatory Visit: Payer: Self-pay

## 2023-10-23 ENCOUNTER — Ambulatory Visit (INDEPENDENT_AMBULATORY_CARE_PROVIDER_SITE_OTHER): Payer: No Typology Code available for payment source

## 2023-10-23 VITALS — BP 90/60 | Ht 71.5 in | Wt 239.0 lb

## 2023-10-23 DIAGNOSIS — Z2821 Immunization not carried out because of patient refusal: Secondary | ICD-10-CM

## 2023-10-23 DIAGNOSIS — Z0000 Encounter for general adult medical examination without abnormal findings: Secondary | ICD-10-CM

## 2023-10-23 LAB — SEDIMENTATION RATE: Sed Rate, Automated: 3 mm/h (ref 0–30)

## 2023-10-23 LAB — CBC WITH AUTO DIFFERENTIAL
Basophils %: 0.7 % (ref 0.0–1.0)
Basophils Absolute: 0.03 K/UL (ref 0.00–0.10)
Eosinophils %: 3.1 % (ref 0.0–7.0)
Eosinophils Absolute: 0.13 K/UL (ref 0.00–0.40)
Hematocrit: 42.1 % (ref 35.0–47.0)
Hemoglobin: 13.6 g/dL (ref 11.5–16.0)
Immature Granulocytes %: 0.5 % (ref 0.0–0.5)
Immature Granulocytes Absolute: 0.02 K/UL (ref 0.00–0.04)
Lymphocytes %: 22.4 % (ref 12.0–49.0)
Lymphocytes Absolute: 0.93 K/UL (ref 0.80–3.50)
MCH: 32.2 pg (ref 26.0–34.0)
MCHC: 32.3 g/dL (ref 30.0–36.5)
MCV: 99.8 FL — ABNORMAL HIGH (ref 80.0–99.0)
MPV: 13 FL — ABNORMAL HIGH (ref 8.9–12.9)
Monocytes %: 11.8 % (ref 5.0–13.0)
Monocytes Absolute: 0.49 K/UL (ref 0.00–1.00)
Neutrophils %: 61.5 % (ref 32.0–75.0)
Neutrophils Absolute: 2.55 K/UL (ref 1.80–8.00)
Nucleated RBCs: 1 /100{WBCs} — ABNORMAL HIGH
Platelets: 116 K/uL — ABNORMAL LOW (ref 150–400)
RBC: 4.22 M/uL (ref 3.80–5.20)
RDW: 18.8 % — ABNORMAL HIGH (ref 11.5–14.5)
WBC: 4.2 K/uL (ref 3.6–11.0)
nRBC: 0.04 K/uL — ABNORMAL HIGH (ref 0.00–0.01)

## 2023-10-23 LAB — BASIC METABOLIC PANEL
Anion Gap: 14 mmol/L (ref 2–14)
BUN/Creatinine Ratio: 16 (ref 12–20)
BUN: 19 mg/dL (ref 8–23)
CO2: 22 mmol/L (ref 20–29)
Calcium: 8.5 mg/dL — ABNORMAL LOW (ref 8.8–10.2)
Chloride: 106 mmol/L (ref 98–107)
Creatinine: 1.16 mg/dL — ABNORMAL HIGH (ref 0.60–1.00)
Est, Glom Filt Rate: 47 ml/min/1.73m2 — ABNORMAL LOW (ref >90)
Glucose: 116 mg/dL — ABNORMAL HIGH (ref 65–100)
Potassium: 3.9 mmol/L (ref 3.5–5.1)
Sodium: 142 mmol/L (ref 136–145)

## 2023-10-23 LAB — C-REACTIVE PROTEIN: CRP: 3 mg/dL — ABNORMAL HIGH (ref 0.0–0.5)

## 2023-10-23 LAB — MAGNESIUM: Magnesium: 1.7 mg/dL (ref 1.6–2.4)

## 2023-10-23 LAB — BRAIN NATRIURETIC PEPTIDE: NT Pro-BNP: 5769 pg/mL — ABNORMAL HIGH (ref 0–450)

## 2023-10-23 MED ORDER — TECHNETIUM TO 99M ALBUMIN AGGREGATED
Freq: Once | Status: AC | PRN
Start: 2023-10-23 — End: 2023-10-23
  Administered 2023-10-23: 18:00:00 4.2 via INTRAVENOUS

## 2023-10-23 MED ORDER — FUROSEMIDE 10 MG/ML IJ SOLN
10 | Freq: Two times a day (BID) | INTRAMUSCULAR | Status: DC
Start: 2023-10-23 — End: 2023-10-25
  Administered 2023-10-23 – 2023-10-24 (×3): 80 mg via INTRAVENOUS

## 2023-10-23 MED ORDER — FUROSEMIDE 10 MG/ML IJ SOLN
10 | Freq: Once | INTRAMUSCULAR | Status: AC
Start: 2023-10-23 — End: 2023-10-23
  Administered 2023-10-23: 12:00:00 40 mg via INTRAVENOUS

## 2023-10-23 MED ORDER — IOPAMIDOL 76 % IV SOLN
76 | Freq: Once | INTRAVENOUS | Status: DC | PRN
Start: 2023-10-23 — End: 2023-10-25

## 2023-10-23 MED FILL — FUROSEMIDE 10 MG/ML IJ SOLN: 10 mg/mL | INTRAMUSCULAR | Qty: 8

## 2023-10-23 MED FILL — METOPROLOL TARTRATE 25 MG PO TABS: 25 mg | ORAL | Qty: 1 | Fill #0

## 2023-10-23 MED FILL — POTASSIUM CHLORIDE ER 10 MEQ PO TBCR: 10 meq | ORAL | Qty: 4

## 2023-10-23 MED FILL — ROSUVASTATIN CALCIUM 10 MG PO TABS: 10 mg | ORAL | Qty: 2 | Fill #0

## 2023-10-23 MED FILL — FUROSEMIDE 10 MG/ML IJ SOLN: 10 mg/mL | INTRAMUSCULAR | Qty: 4

## 2023-10-23 MED FILL — PULMICORT 0.25 MG/2ML IN SUSP: 0.25 MG/2ML | RESPIRATORY_TRACT | Qty: 8 | Fill #0

## 2023-10-23 MED FILL — XARELTO 20 MG PO TABS: 20 mg | ORAL | Qty: 1

## 2023-10-23 MED FILL — ACETAMINOPHEN 325 MG PO TABS: 325 mg | ORAL | Qty: 2 | Fill #0

## 2023-10-23 NOTE — Progress Notes (Signed)
 Because this visit was a virtual/telehealth visit,  certain criteria was not obtained, such a blood pressure, CBG if applicable, and timed get up and go. Any medications not marked as taking were not mentioned during the medication reconciliation part of the visit. Any vitals not documented were not able to be obtained due to this being a telehealth visit or patient was unable to self-report a recent blood pressure reading due to a lack of equipment at home via telehealth. Vitals that have been documented are verbally provided by the patient.  This visit was performed by a medical professional under my direct supervision. I was immediately available for consultation/collaboration. I have reviewed and agree with the Annual Wellness Visit documentation.  Subjective:   Margaret Gordon is a 83 y.o. who presents for a Medicare Wellness preventive visit.  As a reminder, Annual Wellness Visits don't include a physical exam, and some assessments may be limited, especially if this visit is performed virtually. We may recommend an in-person follow-up visit with your provider if needed.  Visit Complete: Virtual I connected with  Margaret Gordon on 10/23/23 by a audio enabled telemedicine application and verified that I am speaking with the correct person using two identifiers.  Patient Location: Home  Provider Location: Home Office  I discussed the limitations of evaluation and management by telemedicine. The patient expressed understanding and agreed to proceed.  Vital Signs: Because this visit was a virtual/telehealth visit, some criteria may be missing or patient reported. Any vitals not documented were not able to be obtained and vitals that have been documented are patient reported.  VideoDeclined- This patient declined Librarian, academic. Therefore the visit was completed with audio only.  Persons Participating in Visit: Patient.  AWV Questionnaire: Yes: Patient  Medicare AWV questionnaire was completed by the patient on 10/22/2023; I have confirmed that all information answered by patient is correct and no changes since this date.  Cardiac Risk Factors include: advanced age (>75men, >77 women);obesity (BMI >30kg/m2);hypertension     Objective:    Today's Vitals   10/22/23 1041 10/23/23 0841  BP:  90/60  Weight:  239 lb (108.4 kg)  Height:  5' 11.5 (1.816 m)  PainSc: 6     Body mass index is 32.87 kg/m.     10/23/2023    8:41 AM 02/09/2023   10:22 AM 11/02/2022   12:46 PM 10/20/2022   10:11 AM 10/10/2021   11:37 AM 05/04/2020    9:36 AM 11/03/2016    7:30 AM  Advanced Directives  Does Patient Have a Medical Advance Directive? No No Yes Yes No No No   Type of Chief of Staff of [place];Living will     Does patient want to make changes to medical advance directive?    No - Patient declined     Copy of Healthcare Power of Attorney in Chart?    Yes - validated most recent copy scanned in chart (See row information)     Would patient like information on creating a medical advance directive? No - Patient declined No - Patient declined   No - Patient declined  No - Patient declined      Data saved with a previous flowsheet row definition    Current Medications (verified) Outpatient Encounter Medications as of 10/23/2023  Medication Sig   amLODipine  (NORVASC ) 5 MG tablet TAKE 1 TABLET (5 MG TOTAL) BY MOUTH DAILY.   atorvastatin  (LIPITOR) 10 MG tablet  Take 10 mg by mouth daily.   furosemide  (LASIX ) 20 MG tablet TAKE 1 TABLET BY MOUTH EVERY DAY   gabapentin  (NEURONTIN ) 300 MG capsule Take 1 capsule (300 mg total) by mouth 2 (two) times daily.   HYDROcodone -acetaminophen  (NORCO/VICODIN) 5-325 MG tablet Take 1 tablet by mouth every 6 (six) hours as needed for moderate pain (pain score 4-6).   meclizine  (ANTIVERT ) 25 MG tablet TAKE 1 TABLET (25 MG TOTAL) BY MOUTH DAILY AS NEEDED FOR DIZZINESS.    Multiple Vitamin (MULTIVITAMIN WITH MINERALS) TABS tablet Take 1 tablet by mouth at bedtime.   pantoprazole  (PROTONIX ) 40 MG tablet TAKE 1 TABLET BY MOUTH EVERY DAY   telmisartan -hydrochlorothiazide  (MICARDIS  HCT) 40-12.5 MG tablet TAKE 1 TABLET BY MOUTH EVERY DAY   No facility-administered encounter medications on file as of 10/23/2023.    Allergies (verified) Patient has no known allergies.   History: Past Medical History:  Diagnosis Date   Anemia    hx   Arthritis    Asthma    GERD (gastroesophageal reflux disease)    History of chicken pox    History of glaucoma    History of hiatal hernia    Hyperlipidemia    Hypertension    Migraines    Pneumonia    hx   Scoliosis    Past Surgical History:  Procedure Laterality Date   LUMBAR LAMINECTOMY/DECOMPRESSION MICRODISCECTOMY Left 11/02/2016   Procedure: Left Lumbar Three-Four, Lumbar Four-Five Laminectomy and Foraminotomy;  Surgeon: Joshua Alm RAMAN, MD;  Location: [facility] OR;  Service: Neurosurgery;  Laterality: Left;   TONSILLECTOMY     VESICOVAGINAL FISTULA CLOSURE W/ TAH     Family History  Problem Relation Age of Onset   Hyperlipidemia Mother    Hypertension Mother    Diabetes Mother    Cancer Mother 42   Breast cancer Mother    Hypertension Sister    Hyperlipidemia Sister    Heart disease Sister    Diabetes Sister    Kidney disease Sister    Arthritis Sister    Kidney disease Sister    Hypertension Sister    Hyperlipidemia Sister    Diabetes Sister    Diabetes Brother    Early death Brother    Arthritis Daughter    Social History   Socioeconomic History   Marital status: Married    Spouse name: Ezella Neighbor   Number of children: Not on file   Years of education: Not on file   Highest education level: Some college, no degree  Occupational History   Occupation: Retired    Comment: worked for [employer]    Occupation: Building control surveyor, retired  Tobacco Use   Smoking status: Never    Passive  exposure: Past   Smokeless tobacco: Former  Substance and Sexual Activity   Alcohol use: Yes    Alcohol/week: 1.0 standard drink of alcohol    Types: 1 Glasses of wine per week   Drug use: No   Sexual activity: Not on file  Other Topics Concern   Not on file  Social History Narrative   Not on file   Social Drivers of Health   Financial Resource Strain: Low Risk  (10/23/2023)   Overall Financial Resource Strain (CARDIA)    Difficulty of Paying Living Expenses: Not very hard  Food Insecurity: No Food Insecurity (10/23/2023)   Hunger Vital Sign    Worried About Running Out of Food in the Last Year: Never true    Ran Out of Food  in the Last Year: Never true  Transportation Needs: No Transportation Needs (10/23/2023)   PRAPARE - Administrator, Civil Service (Medical): No    Lack of Transportation (Non-Medical): No  Physical Activity: Insufficiently Active (10/23/2023)   Exercise Vital Sign    Days of Exercise per Week: 3 days    Minutes of Exercise per Session: 10 min  Stress: No Stress Concern Present (10/23/2023)   [employer] of Occupational Health - Occupational Stress Questionnaire    Feeling of Stress: Not at all  Social Connections: Socially Integrated (10/23/2023)   Social Connection and Isolation Panel    Frequency of Communication with Friends and Family: More than three times a week    Frequency of Social Gatherings with Friends and Family: Once a week    Attends Religious Services: More than 4 times per year    Active Member of [employer] or Organizations: Yes    Attends Engineer, structural: More than 4 times per year    Marital Status: Married    Tobacco Counseling Counseling given: Not Answered    Clinical Intake:  Pre-visit preparation completed: Yes  Pain : 0-10 Pain Score: 6  Pain Type: Chronic pain Pain Location: Back Pain Orientation: Lower Pain Descriptors / Indicators: Constant Pain Onset: Today Pain Frequency: Several days a  week     BMI - recorded: 32.87 Nutritional Status: BMI > 30  Obese Nutritional Risks: None Diabetes: No  Lab Results  Component Value Date   HGBA1C 6.3 02/13/2023   HGBA1C 6.5 02/08/2022   HGBA1C 6.1 04/07/2021     How often do you need to have someone help you when you read instructions, pamphlets, or other written materials from your doctor or pharmacy?: 1 - Never What is the last grade level you completed in school?: 2 years of college  Interpreter Needed?: No  Information entered by :: Lyle Anderson LATHER   Activities of Daily Living     10/22/2023   10:41 AM  In your present state of health, do you have any difficulty performing the following activities:  Hearing? 0  Vision? 0  Difficulty concentrating or making decisions? 0  Walking or climbing stairs? 1  Dressing or bathing? 0  Doing errands, shopping? 1  Preparing Food and eating ? N  Using the Toilet? N  In the past six months, have you accidently leaked urine? N  Do you have problems with loss of bowel control? N  Managing your Medications? N  Managing your Finances? N  Housekeeping or managing your Housekeeping? N    Patient Care Team: Avelina Greig BRAVO, MD as PCP - General (Family Medicine)  I have updated your Care Teams any recent Medical Services you may have received from other providers in the past year.     Assessment:   This is a routine wellness examination for Margaret Gordon.  Hearing/Vision screen Hearing Screening - Comments:: No difficulties hearing Vision Screening - Comments:: Patient wears glasses. [facility] vision on [place] drie in [place].   Goals Addressed               This Visit's Progress     Patient Stated (pt-stated)   On track     Be able to get strong to remove cane.       Depression Screen     10/23/2023    8:46 AM 09/06/2023    2:21 PM 07/05/2023   12:48 PM 10/20/2022   10:09 AM 08/10/2022    2:24  PM 06/28/2022    8:27 AM 06/06/2022    9:45 AM  PHQ 2/9 Scores  PHQ  - 2 Score 0 1 0 0 0 0 0  PHQ- 9 Score 0  0  0 0     Fall Risk     10/22/2023   10:41 AM 09/06/2023    2:21 PM 07/05/2023   12:48 PM 10/20/2022   10:10 AM 08/10/2022    2:23 PM  Fall Risk   Falls in the past year? 1 1 0 1 1  Number falls in past yr: 0 0 0 0 0  Injury with Fall? 1 1 0 0 0  Risk for fall due to : History of fall(s) History of fall(s);Impaired mobility Impaired balance/gait No Fall Risks No Fall Risks  Risk for fall due to: Comment   has cain    Follow up Falls evaluation completed;Education provided;Falls prevention discussed Falls evaluation completed Falls evaluation completed Falls prevention discussed Falls evaluation completed    MEDICARE RISK AT HOME:  Medicare Risk at Home Any stairs in or around the home?: (Patient-Rptd) No If so, are there any without handrails?: No Home free of loose throw rugs in walkways, pet beds, electrical cords, etc?: (Patient-Rptd) Yes Adequate lighting in your home to reduce risk of falls?: (Patient-Rptd) Yes Life alert?: (Patient-Rptd) No Use of a cane, walker or w/c?: (Patient-Rptd) Yes Grab bars in the bathroom?: (Patient-Rptd) No Shower chair or bench in shower?: (Patient-Rptd) Yes Elevated toilet seat or a handicapped toilet?: (Patient-Rptd) Yes  TIMED UP AND GO:  Was the test performed?  No  Cognitive Function: 6CIT completed        10/23/2023    8:47 AM 10/20/2022   10:11 AM 10/10/2021   11:37 AM  6CIT Screen  What Year? 0 points 0 points 0 points  What month? 0 points 0 points 0 points  What time? 0 points 0 points 0 points  Count back from 20 0 points 0 points 0 points  Months in reverse 0 points 0 points 0 points  Repeat phrase 0 points 0 points 0 points  Total Score 0 points 0 points 0 points    Immunizations Immunization History  Administered Date(s) Administered   Fluzone Influenza virus vaccine,trivalent (IIV3), split virus 12/19/2014, 12/25/2015, 11/27/2016, 11/20/2017, 12/25/2020   Influenza Split  12/26/2012   Influenza, High Dose Seasonal PF 11/19/2017, 11/29/2018, 11/23/2021, 11/09/2022   Influenza-Unspecified 11/09/2022   PFIZER Comirnaty(Gray Top)Covid-19 Tri-Sucrose Vaccine 11/23/2021   PFIZER(Purple Top)SARS-COV-2 Vaccination 03/28/2019, 04/08/2019, 12/04/2019, 07/17/2020   Pfizer Covid-19 Vaccine Bivalent Booster 2yrs & up 12/25/2020   Pfizer(Comirnaty)Fall Seasonal Vaccine 12 years and older 08/14/2023   Pneumococcal Conjugate-13 09/19/2013   Pneumococcal Polysaccharide-23 10/29/2009   Td 10/29/2009   Tdap 10/08/2020   Unspecified SARS-COV-2 Vaccination 11/09/2022   Zoster, Live 10/07/2019, 04/07/2020    Screening Tests Health Maintenance  Topic Date Due   Zoster Vaccines- Shingrix (1 of 2) 07/24/1959   COVID-19 Vaccine (9 - 2024-25 season) 10/09/2023   INFLUENZA VACCINE  10/05/2023   MAMMOGRAM  07/05/2024   Medicare Annual Wellness (AWV)  10/22/2024   DEXA SCAN  01/06/2025   DTaP/Tdap/Td (3 - Td or Tdap) 10/09/2030   Pneumococcal Vaccine: 50+ Years  Completed   HPV VACCINES  Aged Out   Meningococcal B Vaccine  Aged Out   Colonoscopy  Discontinued   Hepatitis C Screening  Discontinued    Health Maintenance  Health Maintenance Due  Topic Date Due   Zoster Vaccines- Shingrix (1  of 2) 07[birth year]24/1959   COVID-19 Vaccine (9 - 2024-25 season) 10/09/2023   INFLUENZA VACCINE  10/05/2023   Health Maintenance Items Addressed:patient is going to call the pharmacy to fax over shingles records, covid is up to date, and she has not received the flu vaccine yet this year.  Additional Screening:  Vision Screening: Recommended annual ophthalmology exams for early detection of glaucoma and other disorders of the eye. Would you like a referral to an eye doctor? No    Dental Screening: Recommended annual dental exams for proper oral hygiene  Community Resource Referral / Chronic Care Management: CRR required this visit?  No   CCM required this visit?  No   Plan:    I  have personally reviewed and noted the following in the patient's chart:   Medical and social history Use of alcohol, tobacco or illicit drugs  Current medications and supplements including opioid prescriptions. Patient is not currently taking opioid prescriptions. Functional ability and status Nutritional status Physical activity Advanced directives List of other physicians Hospitalizations, surgeries, and ER visits in previous 12 months Vitals Screenings to include cognitive, depression, and falls Referrals and appointments  In addition, I have reviewed and discussed with patient certain preventive protocols, quality metrics, and best practice recommendations. A written personalized care plan for preventive services as well as general preventive health recommendations were provided to patient.   Lyle MARLA Right, [place]   10/23/2023   After Visit Summary: (MyChart) Due to this being a telephonic visit, the after visit summary with patients personalized plan was offered to patient via MyChart   Notes: Nothing significant to report at this time.

## 2023-10-23 NOTE — Telephone Encounter (Signed)
 Noted

## 2023-10-23 NOTE — Patient Instructions (Signed)
 Ms. Margaret Gordon , Thank you for taking time out of your busy schedule to complete your Annual Wellness Visit with me. I enjoyed our conversation and look forward to speaking with you again next year. I, as well as your care team,  appreciate your ongoing commitment to your health goals. Please review the following plan we discussed and let me know if I can assist you in the future. Your Game plan/ To Do List    Referrals: If you haven't heard from the office you've been referred to, please reach out to them at the phone provided.   Follow up Visits: We will see or speak with you next year for your Next Medicare AWV with our clinical staff Have you seen your provider in the last 6 months (3 months if uncontrolled diabetes)? Yes  Clinician Recommendations:  Aim for 30 minutes of exercise or brisk walking, 6-8 glasses of water, and 5 servings of fruits and vegetables each day.       This is a list of the screenings recommended for you:  Health Maintenance  Topic Date Due   Zoster (Shingles) Vaccine (1 of 2) 07/24/1961   COVID-19 Vaccine (9 - 2024-25 season) 10/09/2023   Flu Shot  10/05/2023   Mammogram  07/05/2024   Medicare Annual Wellness Visit  10/22/2024   DEXA scan (bone density measurement)  01/06/2025   DTaP/Tdap/Td vaccine (3 - Td or Tdap) 10/09/2030   Pneumococcal Vaccine for age over 20  Completed   HPV Vaccine  Aged Out   Meningitis B Vaccine  Aged Out   Colon Cancer Screening  Discontinued   Hepatitis C Screening  Discontinued    Advanced directives: (In Chart) A copy of your advanced directives are scanned into your chart should your provider ever need it. Advance Care Planning is important because it:  [x]  Makes sure you receive the medical care that is consistent with your values, goals, and preferences  [x]  It provides guidance to your family and loved ones and reduces their decisional burden about whether or not they are making the right decisions based on your  wishes.  Follow the link provided in your after visit summary or read over the paperwork we have mailed to you to help you started getting your Advance Directives in place. If you need assistance in completing these, please reach out to us  so that we can help you!  See attachments for Preventive Care and Fall Prevention Tips.

## 2023-10-23 NOTE — Telephone Encounter (Signed)
 Appointment with Dr. Avelina 10/24/2023 at 12:00 pm.

## 2023-10-23 NOTE — Telephone Encounter (Signed)
 FYI Only or Action Required?: FYI only for provider.  Patient was last seen in primary care on 09/06/2023 by Avelina Greig BRAVO, MD.  Called Nurse Triage reporting Leg Swelling.  Symptoms began several days ago.  Interventions attempted: Rest, hydration, or home remedies.  Symptoms are: unchanged.  Triage Disposition: See Physician Within 24 Hours  Patient/caregiver understands and will follow disposition?: Yes  Copied from CRM #[id number]. Topic: Clinical - Red Word Triage >> Oct 23, 2023  9:32 AM Frederich PARAS wrote: [facility] that prompted transfer to Nurse Triage: swelling pt calling she would like a callback from dr. Sherrel nurse. However her rreason for calling is due to swelling in legs, both legs are swollen, Pt just finished wellness check, and she is still having swelliness in legs. Reason for Disposition  [1] MODERATE leg swelling (e.g., swelling extends up to knees) AND [2] new-onset or getting worse  Answer Assessment - Initial Assessment Questions 1. ONSET: When did the swelling start? (e.g., minutes, hours, days)     Continued leg swelling 2. LOCATION: What part of the leg is swollen?  Are both legs swollen or just one leg?     Both  3. SEVERITY: How bad is the swelling? (e.g., localized; mild, moderate, severe)     Bilateral lower legs, left up to knee, firm to touch-non pitting 4. REDNESS: Is there redness or signs of infection?     Denies 5. PAIN: Is the swelling painful to touch? If Yes, ask: How painful is it?   (Scale 1-10; mild, moderate or severe)     Left occasional discomfort at the knee 6. FEVER: Do you have a fever? If Yes, ask: What is it, how was it measured, and when did it start?      Denies 7. CAUSE: What do you think is causing the leg swelling?     Unsure 8. MEDICAL HISTORY: Do you have a history of blood clots (e.g., DVT), cancer, heart failure, kidney disease, or liver failure?      9. RECURRENT SYMPTOM: Have you had leg swelling  before? If Yes, ask: When was the last time? What happened that time?     Yes, X 1 year 10. OTHER SYMPTOMS: Do you have any other symptoms? (e.g., chest pain, difficulty breathing)       Denies  Additional info: Leg swelling is chronic X 1 year, called today because left leg is firm to touch, not painful, denies all other symptoms. She does try to elevate her legs but admits she doesn't do it all that often.  Protocols used: Leg Swelling and Edema-A-AH

## 2023-10-23 NOTE — Progress Notes (Signed)
 Cardiology Progress Note  10/23/2023    Admit Date: 10/21/2023  Admit Diagnosis: Hypoxemia [R09.02]  Acute respiratory distress [R06.03]  Atrial fibrillation with rapid ventricular response (HCC) [I48.91]  Atrial fibrillation, unspecified type (HCC) [I48.91]  CC:  AF, HF    Assessment/Recommendations:  Atrial fibrillation with RVR  New diagnosis   Self converted to NSR 8/18  Continue metoprolol   CHA2DSVASc score 6 suggesting high cardioembolic risk. Started Xarelto . Will need to monitor thrombocytopenia. No signs of bleeding currently  Acute on chronic hypoxic respiratory failure. 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 Preserved LVEF per echo this admission  Increase furosemide  to 80 mg BID  Daily labs  Consult pulmonology  COPD on home oxygen  Thrombocytopenia  Stable  HTN  DM II  Morbid obesity  Lymphedema      Thank you for this consult. We will continue to follow along. Please contact us  for any further questions or concerns.    Hospital problem list     Principal Problem:    Atrial fibrillation with rapid ventricular response (HCC)  Resolved Problems:    * No resolved hospital problems. *                                                        Subjective:     Denies dyspnea at rest. NO chest pain. Still with leg pain and swelling.    ROS: All other systems reviewed and were negative other than mentioned above.                                            No change in family and social history from H&P/Consult note.    Objective:    Physical Exam:  24 hr VS reviewed, overall VSSAF  Temp (24hrs), Avg:97.8 F (36.6 C), Min:97.1 F (36.2 C), Max:98.4 F (36.9 C)    Patient Vitals for the past 8 hrs:   Pulse   10/23/23 0714 58   10/23/23 0600 58   10/23/23 0405 60   10/23/23 0400 59   10/23/23 0200 57    Patient Vitals for the past 8 hrs:   Resp   10/23/23 0714 19   10/23/23 0405 20    Patient Vitals for the past 8 hrs:   BP   10/23/23 0714  114/76   10/23/23 0405 113/76          Intake/Output Summary (Last 24 hours) at 10/23/2023 0754  Last data filed at 10/23/2023 9285  Gross per 24 hour   Intake 450 ml   Output 1300 ml   Net -850 ml       General: obese  HEENT: sclera anicteric, moist mucous membranes  Neck: supple, no JVD   CV: regular rate and rhythm, distant heart sounds, no murmurs, rubs or gallops,  Lungs: no respiratory distress, lungs clear to auscultation   Abdomen: soft, non distended  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  Current medications reviewed as noted below.    Telemetry independently reviewed : [x]   sinus    []   chronic afib     []   par afib    []   NSVT    ECG independently reviewed: []   NSR    []   no significant changes   [x]   no new ECG provided for review    No results for input(s): PH, PCO2, PO2 in the last 72 hours.  No results for input(s): CPK, CKMB, TROPONINI in the last 72 hours.  Recent Labs     10/21/23  1416 10/22/23  0416 10/23/23  0505   NA 143 142 142   K 3.7 3.2* 3.9   CL 107 106 106   CO2 19* 19* 22   BUN 17 18 19    WBC 4.3 3.7 4.2   HGB 14.5 13.9 13.6   HCT 43.1 42.4 42.1   PLT 116* 94* 116*     Recent Labs     10/21/23  1416   GLOB 4.0     Recent Labs     10/22/23  0416   INR 1.5*      No results for input(s): TIBC in the last 72 hours.    Invalid input(s): FE, PSAT, FERR   Lab Results   Component Value Date/Time    GLUCPOC 237 01/15/2018 10:37 AM    GLUCPOC 174 10/12/2017 11:24 AM       Current Facility-Administered Medications   Medication Dose Route Frequency    furosemide  (LASIX ) injection 80 mg  80 mg IntraVENous BID AC    furosemide  (LASIX ) injection 40 mg  40 mg IntraVENous Once    miconazole  nitrate 2 % ointment   Topical BID    balsum peru-castor oil (VENELEX)  ointment   Topical BID    rivaroxaban  (XARELTO ) tablet 20 mg  20 mg Oral Dinner    [Held by provider] dilTIAZem  125 mg in sodium chloride  0.9 % 125 mL infusion  2.5-15 mg/hr IntraVENous Continuous    sodium chloride  flush 0.9 % injection 5-40 mL  5-40 mL IntraVENous 2 times per day    sodium chloride  flush 0.9 % injection 5-40 mL  5-40 mL IntraVENous PRN    0.9 % sodium chloride  infusion   IntraVENous PRN    potassium chloride  (KLOR-CON ) extended release tablet 40 mEq  40 mEq Oral PRN    Or    potassium bicarb-citric acid  (EFFER-K) effervescent tablet 40 mEq  40 mEq Oral PRN    Or    potassium chloride  10 mEq/100 mL IVPB (Peripheral Line)  10 mEq IntraVENous PRN    magnesium  sulfate 2000 mg in 50 mL IVPB premix  2,000 mg IntraVENous PRN    ondansetron  (ZOFRAN -ODT) disintegrating tablet 4 mg  4 mg Oral Q8H PRN    Or    ondansetron  (ZOFRAN ) injection 4 mg  4 mg IntraVENous Q6H PRN    polyethylene glycol (GLYCOLAX ) packet 17 g  17 g Oral Daily PRN    acetaminophen  (TYLENOL ) tablet 650 mg  650 mg Oral Q6H PRN    Or    acetaminophen  (TYLENOL ) suppository 650 mg  650 mg Rectal Q6H PRN    methIMAzole  (TAPAZOLE ) tablet 5 mg  5 mg Oral Daily    rosuvastatin  (CRESTOR ) tablet 20 mg  20 mg Oral Nightly    budesonide  (PULMICORT ) nebulizer suspension 250 mcg  0.25 mg Nebulization BID RT    metoprolol  tartrate (LOPRESSOR ) tablet 25 mg  25 mg Oral  BID         Judeth Gilles T. Maree, MD  General Cardiology  [facility]  10/23/23

## 2023-10-23 NOTE — Progress Notes (Signed)
  [facility]                                                                                          Hospitalist Progress Note  [name], MD  Office Phone: [phone]        Date of Service:  10/23/2023  NAME:  Allison Newman  DOB:  [birth year]  MRN:  [id number]       Admission Summary:   83 y.o. female who presents with above.  Patient came to the emergency room today for evaluation of 2 to 3 weeks of increasing edema.  She also had worsening weakness and was not able to get up out of bed, so finally agreed to let her son have her brought to the emergency room.  Patient at baseline lives at home with a different son and is responsible for her own medical care.  She does have diabetes and lymphedema, no longer following with a lymphedema clinic.  Patient says that she noticed over the last few weeks that her swelling in her legs has been getting worse.  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  Patient has oxygen at home that she uses sometimes and cannot really describe when she is supposed to be using oxygen.  Patient denies any cardiac history including CAD, CHF or arrhythmia.  Patient does state that she has intermittently had palpitations over the last year but none recently.  Patient denies chest pain.         Interval history / Subjective:   Denies CP, breathing a little better     Assessment & Plan:     New atrial fibrillation with rapid ventricular response:   - diltiazem  drip off  - continue PO metoprolol   - TTE with normal LVEF but severe pHTN and RV failure  - now on Xarelto   - cardiology consulted, IV furosemide  to BID    COPD without acute exacerbation, acute/chronic hypoxic resp failure  Severe pulm HTN, cor pulmonale, newly dx  -pulm consulted noted recs: V/Q, HRCT, labs    Type 2 DM:  - SSI for now     Essential HTN:  - holding lisinopril, triameterene/HCT due  to borderline BP on diltiazem  gtt  - con't home metoprolol  with hold parameters     Hyperthyroidism:  - con't methimazole      Hyperlipidemia: con't rosuvastatin      Lymphedema with increased LE swelling (?due to CHF):  - needs lymphedema clinic    Class III obesity          Code status: full  Prophylaxis: anticoagulated  Care Plan discussed with: pt rn cm  Anticipated Disposition:   Inpatient  Cardiac monitoring: Telemetry  Central Line:       CRITICAL CARE ATTESTATION:  I had a face to face encounter with the patient, reviewed and interpreted patient data including clinical events, labs, images, vital signs, I/O's, and examined patient.  I have discussed the case and the plan and management of the patient's care with the  consulting services, the bedside nurses and necessary ancillary providers.       NOTE OF PERSONAL INVOLVEMENT IN CARE   This patient has a high probability of imminent, clinically significant deterioration, which requires the highest level of preparedness to intervene urgently. I participated in the decision-making and personally managed or directed the management of the following life and organ supporting interventions that required my frequent assessment to treat or prevent imminent deterioration.     I personally spent 35 minutes of critical care time.  This is time spent at this critically ill patient's bedside actively involved in patient care as well as the coordination of care and discussions with the patient's family.  This does not include any procedural time which has been billed separately.           Social Drivers of Health     Tobacco Use: Medium Risk (05/22/2023)    Received from [facility]    Patient History     Smoking Tobacco Use: Former     Smokeless Tobacco Use: Never     Passive Exposure: Not on file   Alcohol Use: Not on file   Financial Resource Strain: Low Risk  (04/21/2021)    Received from [facility]    Overall Financial Resource Strain (CARDIA)     Difficulty of Paying  Living Expenses: Not hard at all   Food Insecurity: No Food Insecurity (10/22/2023)    Hunger Vital Sign     Worried About Running Out of Food in the Last Year: Never true     Ran Out of Food in the Last Year: Never true   Transportation Needs: No Transportation Needs (10/22/2023)    PRAPARE - Therapist, art (Medical): No     Lack of Transportation (Non-Medical): No   Physical Activity: Not on file   Stress: Not on file   Social Connections: Not on file   Intimate Partner Violence: Not on file   Depression: Not at risk (07/19/2023)    Received from [facility]    PHQ-2     Patient Health Questionnaire-2 Score: 0   Housing Stability: Low Risk  (10/22/2023)    Housing Stability Vital Sign     Unable to Pay for Housing in the Last Year: No     Number of Times Moved in the Last Year: 0     Homeless in the Last Year: No   Interpersonal Safety: Not At Risk (10/22/2023)    Interpersonal Safety Domain Source: IP Abuse Screening     Physical abuse: Denies     Verbal abuse: Denies     Emotional abuse: Denies     Financial abuse: Denies     Sexual abuse: Denies   Utilities: Not At Risk (10/22/2023)    [facility] Utilities     Threatened with loss of utilities: No       Review of Systems:   A comprehensive review of systems was negative.       Vital Signs:    Last 24hrs VS reviewed since prior progress note. Most recent are:  Vitals:    10/23/23 1000   BP:    Pulse: 77   Resp:    Temp:    SpO2:          Intake/Output Summary (Last 24 hours) at 10/23/2023 1131  Last data filed at 10/23/2023 0800  Gross per 24 hour   Intake 700 ml   Output 1600 ml   Net -  900 ml        Physical Examination:     I had a face to face encounter with this patient and independently examined them on 10/23/2023 as outlined below:          General : alert x 3, awake, no acute distress,   HEENT: PEERL, EOMI, moist mucus membrane  Neck: supple, no JVD, no meningeal signs  Chest: Clear to auscultation bilaterally   CVS: rr nl rate  Abd: soft/ non  tender, non distended, BS physiological,   Ext: no clubbing, no cyanosis, ++lymphedema  Neuro/Psych: grossly non-focal  Skin: warm     Data Review:    Review and/or order of clinical lab test  Review and/or order of tests in the radiology section of CPT  Review and/or order of tests in the medicine section of CPT      I have personally and independently reviewed all pertinent labs, diagnostic studies, imaging, and have provided independent interpretation of the same.     Labs:     Recent Labs     10/22/23  0416 10/23/23  0505   WBC 3.7 4.2   HGB 13.9 13.6   HCT 42.4 42.1   PLT 94* 116*     Recent Labs     10/21/23  1416 10/22/23  0416 10/23/23  0505   NA 143 142 142   K 3.7 3.2* 3.9   CL 107 106 106   CO2 19* 19* 22   BUN 17 18 19    MG  --  1.7 1.7     Recent Labs     10/21/23  1416   ALT <5*   GLOB 4.0     Recent Labs     10/22/23  0416   INR 1.5*      No results for input(s): TIBC in the last 72 hours.    Invalid input(s): FE, PSAT, FERR   No results found for: RBCF   No results for input(s): PH, PCO2, PO2 in the last 72 hours.  No results for input(s): CPK in the last 72 hours.    Invalid input(s): CPKMB, CKNDX, TROIQ  No results found for: CHOL, CHLST, CHOLV, HDL, HDLC, LDL, LDLC  Lab Results   Component Value Date/Time    GLUCPOC 237 01/15/2018 10:37 AM    GLUCPOC 174 10/12/2017 11:24 AM     @LABUA @    Notes reviewed from all clinical/nonclinical/nursing services involved in patient's clinical care. Care coordination discussions were held with appropriate clinical/nonclinical/ nursing providers based on care coordination needs.         Patients current active Medications were reviewed, considered, added and adjusted based on the clinical condition today.      Home Medications were reconciled to the best of my ability given all available resources at the time of admission. Route is PO if not otherwise noted.      Admission Status:[id number]:::1}      Medications Reviewed:      Current Facility-Administered Medications   Medication Dose Route Frequency    furosemide  (LASIX ) injection 80 mg  80 mg IntraVENous BID AC    miconazole  nitrate 2 % ointment   Topical BID    balsum peru-castor oil (VENELEX) ointment   Topical BID    rivaroxaban  (XARELTO ) tablet 20 mg  20 mg Oral Dinner    [Held by provider] dilTIAZem  125 mg in sodium chloride  0.9 % 125 mL infusion  2.5-15 mg/hr IntraVENous Continuous    sodium chloride   flush 0.9 % injection 5-40 mL  5-40 mL IntraVENous 2 times per day    sodium chloride  flush 0.9 % injection 5-40 mL  5-40 mL IntraVENous PRN    0.9 % sodium chloride  infusion   IntraVENous PRN    potassium chloride  (KLOR-CON ) extended release tablet 40 mEq  40 mEq Oral PRN    Or    potassium bicarb-citric acid  (EFFER-K) effervescent tablet 40 mEq  40 mEq Oral PRN    Or    potassium chloride  10 mEq/100 mL IVPB (Peripheral Line)  10 mEq IntraVENous PRN    magnesium  sulfate 2000 mg in 50 mL IVPB premix  2,000 mg IntraVENous PRN    ondansetron  (ZOFRAN -ODT) disintegrating tablet 4 mg  4 mg Oral Q8H PRN    Or    ondansetron  (ZOFRAN ) injection 4 mg  4 mg IntraVENous Q6H PRN    polyethylene glycol (GLYCOLAX ) packet 17 g  17 g Oral Daily PRN    acetaminophen  (TYLENOL ) tablet 650 mg  650 mg Oral Q6H PRN    Or    acetaminophen  (TYLENOL ) suppository 650 mg  650 mg Rectal Q6H PRN    methIMAzole  (TAPAZOLE ) tablet 5 mg  5 mg Oral Daily    rosuvastatin  (CRESTOR ) tablet 20 mg  20 mg Oral Nightly    budesonide  (PULMICORT ) nebulizer suspension 250 mcg  0.25 mg Nebulization BID RT    metoprolol  tartrate (LOPRESSOR ) tablet 25 mg  25 mg Oral BID     ______________________________________________________________________  EXPECTED LENGTH OF STAY: 3  ACTUAL LENGTH OF STAY:          2                 Jalila Goodnough Al-Saadoon, MD

## 2023-10-23 NOTE — Plan of Care (Signed)
 Problem: Safety - Adult  Goal: Free from fall injury  Outcome: Progressing     Problem: Chronic Conditions and Co-morbidities  Goal: Patient's chronic conditions and co-morbidity symptoms are monitored and maintained or improved  Outcome: Progressing     Problem: Discharge Planning  Goal: Discharge to home or other facility with appropriate resources  Outcome: Progressing     Problem: Skin/Tissue Integrity  Goal: Skin integrity remains intact  Description: 1.  Monitor for areas of redness and/or skin breakdown  2.  Assess vascular access sites hourly  3.  Every 4-6 hours minimum:  Change oxygen saturation probe site  4.  Every 4-6 hours:  If on nasal continuous positive airway pressure, respiratory therapy assess nares and determine need for appliance change or resting period  Outcome: Progressing     Problem: Pain  Goal: Verbalizes/displays adequate comfort level or baseline comfort level  Outcome: Progressing

## 2023-10-23 NOTE — Consults (Signed)
 Pulmonary, Critical Care, and Sleep Medicine~Consult Note    Name: Allison Newman MRN: [id number]   DOB: 1940-11-06 Hospital: [facility]   Date: 10/23/2023 10:28 AM Admission: 10/21/2023     Impression Plan   Acute on chronic hypoxic resp failure  A fib  Hx of VTE in 2019. Treated with eliquis. Completed treatment. One time event  Abnormal CT scan: hx of mediastinal and left hilar LAD with patchy bilateral GGO. Has restrictive lung disease on PFTs. Query sarcoid?  PH; seen by ECHO since 2019.  Does not appear that she has had a RHC? If sarcoidosis could be group 1, if chronic VTE could be group 4, if untreated OSA could be group 3, does have known cardiac issues so likely has an element of group 2. Has progressed but does not appear to have progressed much since 2022  Thryoid nodule   Never smoker  At risk for OSA ACE, CRP, ESR  HRCT  V/Q scan to look for chronic emboli  Query need for bubble study?  O2 titration above 90%  Will need outpatient sleep study  Follow up with Dr Marvis after discharge   Would benefit from a RHC once her volume status is optimize      Daily Progression:    Consult Note requested by hospitalist service     Ms. Ammirati is a patient with a history of pulmonary hypertension presenting due to fluid retention. Found to have A fib on admission.    The patient takes a fluid pill (furosemide ) every other day and Tapazole  daily; as instructed by [facility]. She was recently started on Xarelto  during this admission. Ms. Steptoe uses home oxygen at 4 liters and takes Wixela, though the effectiveness of the latter is uncertain. She has a history of blood clots in 2019, for which she completed a course of Eliquis. She has only had the on VTE event. She also denies any family history of sarcoidosis and a personal history of smoking. Currently no signs of infection, wheeze, or cough. She has never been tested for OSA, but is at risk.     Laboratory, Imaging, and Diagnostic Test Results  - Date:  10/23/23    - BNP: 5769  - Date: 10/22/23    - TSH 1.7, Free T4 1.5 (within normal limits)  - Date: 10/21/23    - Arterial Blood Gas: pH 7.49, pCO2 28.7, pO2 63, HCO3 21.9    - Date: 05/17/20    - CT scan: Scattered prominent but non-enlarged mediastinal lymph nodes (unchanged from 2021), scattered mosaic attenuation, small airways disease, few small sub-centimeter pulmonary nodules, retrosternal goiter  - Date: 10/521    - CTA: No blood clots, mediastinal lymphadenopathy in left hilar region, measuring about 1.5 cm    - Date: 05/03/20    - Pulmonary Function Test: FVC 1.6 liters (77% predicted), FEV1 1.24 liters (77% predicted), TLC 100% predicted, VC 74% predicted, RV 147% predicted, DLCO normalized when corrected towards alveolar volume    - Date: 10/22/23    - Echocardiogram: EF 66%, RV severely dilated, RA severely dilated, RVSP 72 mmHg  - Date: 03/22/20    - Echocardiogram: EF 60-65%, RV dilated, RA dilated, RVSP 70-75 mmHg, bubble study unable to perform  - Date: 06/08/17    - Echocardiogram: EF 60-65%, RV moderately dilated, RVSP 45 mmHg    Review of Systems  Respiratory: Positive for use of home oxygen at 4 liters.    I have reviewed the labs and  previous day's notes.    Pertinent items are noted in HPI.  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    Diabetes (HCC)     Elevated BUN 09/13/2011    Finger lesion 03/02/2016    Gallbladder polyp 04/08/2013    Gastrointestinal disorder     GERD    GERD (gastroesophageal reflux disease)     Hypercholesterolemia     Hypertension     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     Microalbuminuria 08/29/2013    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, adult (HCC) 08/28/2011    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    On aspirin  at home 05/03/2015    Other unknown and unspecified cause of morbidity or mortality     hx of bronchitis    Rash, skin 09/25/2012    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) 03/02/2016    Slow transit constipation 08/13/2014    Umbilical hernia 05/08/2013      Past Surgical History:   Procedure Laterality Date    COLONOSCOPY N/A 04/23/2018    COLONOSCOPY AND ESOPHAGOGASTRODUODENOSCOPY (EGD) performed by [place] Carlus SQUIBB., MD at [facility] ENDOSCOPY    ORTHOPEDIC SURGERY  2000    screw in foot left    ORTHOPEDIC SURGERY  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    OTHER SURGICAL HISTORY      colonoscopy    TUBAL LIGATION        Prior to Admission medications   Medication Sig Start Date End Date Taking? Authorizing Provider   fluticasone-salmeterol (ADVAIR) 100-50 MCG/ACT AEPB diskus inhaler Inhale 1 puff into the lungs in the morning and 1 puff in the evening.   Yes [provider]   Lancets MISC Use BID. Dx: E11.9 03/27/17   Automatic Reconciliation, Ar   furosemide  (LASIX ) 40 MG tablet Take 1 tablet by mouth daily 12/16/19   Automatic Reconciliation, Ar   lisinopril (PRINIVIL;ZESTRIL) 10 MG tablet TAKE 1 TABLET EVERY DAY  Patient not taking: Reported on 10/22/2023 10/30/17   Automatic Reconciliation, Ar   metFORMIN (GLUCOPHAGE) 1000 MG tablet TAKE 1 TABLET TWICE DAILY WITH MEALS 09/13/18   Automatic Reconciliation, Ar   methIMAzole  (TAPAZOLE ) 5 MG tablet TAKE 1 TABLET EVERY DAY 08/16/18   Automatic Reconciliation, Ar   metoprolol  tartrate (LOPRESSOR ) 50 MG tablet TAKE 1 TABLET TWICE DAILY 09/23/18   Automatic Reconciliation, Ar   rosuvastatin  (CRESTOR ) 20 MG tablet TAKE 1 TABLET EVERY NIGHT 09/13/18   Automatic Reconciliation, Ar   triamterene -hydroCHLOROthiazide  (DYAZIDE ) 37.5-25 MG per capsule TAKE 1 CAPSULE EVERY DAY 10/30/17   Automatic Reconciliation, Ar      Allergies   Allergen Reactions    Almond Oil Anaphylaxis, Hives and Itching    Macadamia Nut Oil Anaphylaxis    Other Anaphylaxis     All tree nuts       Social History     Tobacco Use    Smoking status: Former     Current packs/day: 0.00     Types:  Cigarettes     Quit date: 06/03/1991     Years since quitting: 32.4    Smokeless tobacco: Never   Substance Use Topics    Alcohol use: No     Alcohol/week: 0.0 standard drinks of alcohol      Family History   Problem Relation Age of Onset    Heart Disease Mother         rheumatic fever    Diabetes Mother     Cancer Brother         throat    Breast Cancer Sister     Cancer Sister         colon & breast    Breast Cancer Mother     Cancer Father         lung     OBJECTIVE:     Vital Signs:     BP 108/65   Pulse 77   Temp 97.6 F (36.4 C) (Oral)   Resp 19   Ht 1.613 m (5' 3.5)   Wt 135.8 kg (299 lb 6.2 oz)   SpO2 93%   BMI 52.20 kg/m    Temp (24hrs), Avg:97.8 F (36.6 C), Min:97.1 F (36.2 C), Max:98.4 F (36.9 C)     Intake/Output:     Last shift: 08/19 0701 - 08/19 1900  In: 700 [P.O.:700]  Out: 600 [Urine:600]    Last 3 shifts: 08/17 1901 - 08/19 0700  In: -   Out: 2100 [Urine:2100]        Intake/Output Summary (Last 24 hours) at 10/23/2023 1028  Last data filed at 10/23/2023 0800  Gross per 24 hour   Intake 700 ml   Output 1600 ml   Net -900 ml       Physical Exam:                                        Exam Findings Other   General: No resp distress noted, appears stated age    HEENT:  No ulcers, JVD not elevated, no cervical LAD    Chest: No pectus deformity, normal chest rise b/l    HEART:  RRR, no murmurs/rubs/gallops    Lungs:  CTA b/l, no rhonchi/crackles/wheeze, diminished BS at bases    ABD: Soft/NT, non rigid mildly distended    EXT: No cyanosis/clubbing/edema, normal peripheral pulses    Skin: No rashes or ulcers, no mottling    Neuro: A/O x 3        Medications:  Current Facility-Administered Medications   Medication Dose Route Frequency     furosemide  (LASIX ) injection 80 mg  80 mg IntraVENous BID AC    miconazole  nitrate 2 % ointment   Topical BID    balsum peru-castor oil (VENELEX) ointment   Topical BID    rivaroxaban  (XARELTO ) tablet 20 mg  20 mg Oral Dinner    [Held by provider] dilTIAZem  125 mg in sodium chloride  0.9 % 125 mL infusion  2.5-15 mg/hr IntraVENous Continuous    sodium chloride  flush 0.9 % injection 5-40 mL  5-40 mL IntraVENous 2 times per day    sodium chloride  flush 0.9 % injection 5-40 mL  5-40 mL IntraVENous PRN    0.9 % sodium chloride  infusion   IntraVENous PRN    potassium chloride  (KLOR-CON ) extended release tablet 40 mEq  40 mEq Oral PRN    Or    potassium  bicarb-citric acid  (EFFER-K) effervescent tablet 40 mEq  40 mEq Oral PRN    Or    potassium chloride  10 mEq/100 mL IVPB (Peripheral Line)  10 mEq IntraVENous PRN    magnesium  sulfate 2000 mg in 50 mL IVPB premix  2,000 mg IntraVENous PRN    ondansetron  (ZOFRAN -ODT) disintegrating tablet 4 mg  4 mg Oral Q8H PRN    Or    ondansetron  (ZOFRAN ) injection 4 mg  4 mg IntraVENous Q6H PRN    polyethylene glycol (GLYCOLAX ) packet 17 g  17 g Oral Daily PRN    acetaminophen  (TYLENOL ) tablet 650 mg  650 mg Oral Q6H PRN    Or    acetaminophen  (TYLENOL ) suppository 650 mg  650 mg Rectal Q6H PRN    methIMAzole  (TAPAZOLE ) tablet 5 mg  5 mg Oral Daily    rosuvastatin  (CRESTOR ) tablet 20 mg  20 mg Oral Nightly    budesonide  (PULMICORT ) nebulizer suspension 250 mcg  0.25 mg Nebulization BID RT    metoprolol  tartrate (LOPRESSOR ) tablet 25 mg  25 mg Oral BID       Labs:  ABG Invalid input(s): ABG     CBC Recent Labs     10/21/23  1416 10/22/23  0416 10/23/23  0505   WBC 4.3 3.7 4.2   HGB 14.5 13.9 13.6   HCT 43.1 42.4 42.1   PLT 116* 94* 116*   MCV 94.1 97.7 99.8*   MCH 31.7 32.0 32.2        Metabolic  Panel Recent Labs     10/21/23  1416 10/22/23  0416 10/23/23  0505   NA 143 142 142   K 3.7 3.2* 3.9   CL 107 106 106   CO2 19* 19* 22   BUN 17 18 19    MG  --  1.7 1.7   ALT <5*  --   --    INR   --  1.5*  --         Pertinent Labs                Jorge Retz Waddell, PA-C  10/23/2023

## 2023-10-23 NOTE — Plan of Care (Signed)
 Problem: Safety - Adult  Goal: Free from fall injury  Outcome: Progressing  Flowsheets (Taken 10/23/2023 2342)  Free From Fall Injury: Instruct family/caregiver on patient safety     Problem: Chronic Conditions and Co-morbidities  Goal: Patient's chronic conditions and co-morbidity symptoms are monitored and maintained or improved  Outcome: Progressing  Flowsheets (Taken 10/23/2023 2342)  Care Plan - Patient's Chronic Conditions and Co-Morbidity Symptoms are Monitored and Maintained or Improved: Monitor and assess patient's chronic conditions and comorbid symptoms for stability, deterioration, or improvement     Problem: Discharge Planning  Goal: Discharge to home or other facility with appropriate resources  Outcome: Progressing  Flowsheets (Taken 10/23/2023 2342)  Discharge to home or other facility with appropriate resources:   Identify barriers to discharge with patient and caregiver   Identify discharge learning needs (meds, wound care, etc)   Refer to discharge planning if patient needs post-hospital services based on physician order or complex needs related to functional status, cognitive ability or social support system   Arrange for needed discharge resources and transportation as appropriate     Problem: Skin/Tissue Integrity  Goal: Skin integrity remains intact  Description: 1.  Monitor for areas of redness and/or skin breakdown  2.  Assess vascular access sites hourly  3.  Every 4-6 hours minimum:  Change oxygen saturation probe site  4.  Every 4-6 hours:  If on nasal continuous positive airway pressure, respiratory therapy assess nares and determine need for appliance change or resting period  Outcome: Progressing  Flowsheets (Taken 10/23/2023 2342)  Skin Integrity Remains Intact:   Check visual cues for pain   Monitor skin under medical devices   Assess need for specialty bed   Positioning devices   Monitor for areas of redness and/or skin breakdown

## 2023-10-24 ENCOUNTER — Inpatient Hospital Stay: Payer: Medicare (Managed Care) | Primary: Geriatric Medicine

## 2023-10-24 ENCOUNTER — Ambulatory Visit (INDEPENDENT_AMBULATORY_CARE_PROVIDER_SITE_OTHER): Admitting: Family Medicine

## 2023-10-24 VITALS — BP 98/60 | HR 88 | Temp 98.4°F | Ht 66.75 in | Wt 239.2 lb

## 2023-10-24 DIAGNOSIS — N281 Cyst of kidney, acquired: Secondary | ICD-10-CM | POA: Diagnosis not present

## 2023-10-24 DIAGNOSIS — K4090 Unilateral inguinal hernia, without obstruction or gangrene, not specified as recurrent: Secondary | ICD-10-CM | POA: Diagnosis not present

## 2023-10-24 DIAGNOSIS — D3001 Benign neoplasm of right kidney: Secondary | ICD-10-CM | POA: Diagnosis not present

## 2023-10-24 DIAGNOSIS — Q630 Accessory kidney: Secondary | ICD-10-CM | POA: Diagnosis not present

## 2023-10-24 DIAGNOSIS — D3002 Benign neoplasm of left kidney: Secondary | ICD-10-CM | POA: Diagnosis not present

## 2023-10-24 DIAGNOSIS — K449 Diaphragmatic hernia without obstruction or gangrene: Secondary | ICD-10-CM | POA: Diagnosis not present

## 2023-10-24 DIAGNOSIS — I10 Essential (primary) hypertension: Secondary | ICD-10-CM

## 2023-10-24 DIAGNOSIS — I872 Venous insufficiency (chronic) (peripheral): Secondary | ICD-10-CM | POA: Diagnosis not present

## 2023-10-24 DIAGNOSIS — R600 Localized edema: Secondary | ICD-10-CM

## 2023-10-24 LAB — CBC WITH AUTO DIFFERENTIAL
Basophils %: 0.5 % (ref 0.0–1.0)
Basophils Absolute: 0.02 K/UL (ref 0.00–0.10)
Eosinophils %: 3.5 % (ref 0.0–7.0)
Eosinophils Absolute: 0.15 K/UL (ref 0.00–0.40)
Hematocrit: 40.3 % (ref 35.0–47.0)
Hemoglobin: 13.5 g/dL (ref 11.5–16.0)
Immature Granulocytes %: 0.2 % (ref 0.0–0.5)
Immature Granulocytes Absolute: 0.01 K/UL (ref 0.00–0.04)
Lymphocytes %: 22.5 % (ref 12.0–49.0)
Lymphocytes Absolute: 0.95 K/UL (ref 0.80–3.50)
MCH: 32.2 pg (ref 26.0–34.0)
MCHC: 33.5 g/dL (ref 30.0–36.5)
MCV: 96.2 FL (ref 80.0–99.0)
MPV: 11.9 FL (ref 8.9–12.9)
Monocytes %: 9.5 % (ref 5.0–13.0)
Monocytes Absolute: 0.4 K/UL (ref 0.00–1.00)
Neutrophils %: 63.8 % (ref 32.0–75.0)
Neutrophils Absolute: 2.7 K/UL (ref 1.80–8.00)
Nucleated RBCs: 0.9 /100{WBCs} — ABNORMAL HIGH
Platelets: 121 K/uL — ABNORMAL LOW (ref 150–400)
RBC: 4.19 M/uL (ref 3.80–5.20)
RDW: 18.4 % — ABNORMAL HIGH (ref 11.5–14.5)
WBC: 4.2 K/uL (ref 3.6–11.0)
nRBC: 0.04 K/uL — ABNORMAL HIGH (ref 0.00–0.01)

## 2023-10-24 LAB — BASIC METABOLIC PANEL
Anion Gap: 14 mmol/L (ref 2–14)
BUN/Creatinine Ratio: 17 (ref 12–20)
BUN: 19 mg/dL (ref 8–23)
CO2: 25 mmol/L (ref 20–29)
Calcium: 8.3 mg/dL — ABNORMAL LOW (ref 8.8–10.2)
Chloride: 102 mmol/L (ref 98–107)
Creatinine: 1.07 mg/dL — ABNORMAL HIGH (ref 0.60–1.00)
Est, Glom Filt Rate: 52 ml/min/1.73m2 — ABNORMAL LOW (ref >90)
Glucose: 131 mg/dL — ABNORMAL HIGH (ref 65–100)
Potassium: 3.3 mmol/L — ABNORMAL LOW (ref 3.5–5.1)
Sodium: 141 mmol/L (ref 136–145)

## 2023-10-24 LAB — MAGNESIUM: Magnesium: 1.6 mg/dL (ref 1.6–2.4)

## 2023-10-24 LAB — ANGIOTENSIN CONVERTING ENZYME: Angio Convert Enzyme: 45 U/L (ref 14–82)

## 2023-10-24 MED ORDER — POTASSIUM CHLORIDE ER 10 MEQ PO TBCR
10 | Freq: Once | ORAL | Status: AC
Start: 2023-10-24 — End: 2023-10-24
  Administered 2023-10-24: 14:00:00 40 meq via ORAL

## 2023-10-24 MED ORDER — SPIRONOLACTONE 25 MG PO TABS
25 | Freq: Every day | ORAL | Status: DC
Start: 2023-10-24 — End: 2023-10-25
  Administered 2023-10-24 – 2023-10-25 (×2): 25 mg via ORAL

## 2023-10-24 MED ORDER — MAGNESIUM SULFATE 2000 MG/50 ML IVPB PREMIX
2 | Freq: Once | INTRAVENOUS | Status: AC
Start: 2023-10-24 — End: 2023-10-24
  Administered 2023-10-24: 14:00:00 2000 mg via INTRAVENOUS

## 2023-10-24 MED FILL — MAGNESIUM SULFATE 2 GM/50ML IV SOLN: 2 GM/50ML | INTRAVENOUS | Qty: 50

## 2023-10-24 MED FILL — SPIRONOLACTONE 25 MG PO TABS: 25 mg | ORAL | Qty: 1

## 2023-10-24 MED FILL — POTASSIUM CHLORIDE ER 10 MEQ PO TBCR: 10 meq | ORAL | Qty: 4

## 2023-10-24 MED FILL — FUROSEMIDE 10 MG/ML IJ SOLN: 10 mg/mL | INTRAMUSCULAR | Qty: 8

## 2023-10-24 NOTE — Progress Notes (Signed)
 Patient ID: Margaret Gordon, female    DOB: [birth year], 83 y.o.   MRN: [id number]  This visit was conducted in person.  BP 98/60   Pulse 88   Temp 98.4 F (36.9 C) (Oral)   Ht 5' 6.75 (1.695 m)   Wt 239 lb 3.2 oz (108.5 kg)   SpO2 99%   BMI 37.75 kg/m    CC:  Chief Complaint  Patient presents with   Leg Swelling    Pt complains of leg swelling in both legs for about a year. Pt states its not getting any better and doesn't know what to do. States of no pain but tightness. Constant tingling in L heel.     Subjective:   HPI: Margaret Gordon is a 83 y.o. female presenting on 10/24/2023 for Leg Swelling (Pt complains of leg swelling in both legs for about a year. Pt states its not getting any better and doesn't know what to do. States of no pain but tightness. Constant tingling in L heel. )  Continued chronic swelling in bilateral legs... no leg pain, does note some tingling.  Labs unremarkable, BMP nml, GFR 43, LFTs nml  07/2023 ECHO: EF 55-60% Compression hose not helpful  Lasix  20 mg daily minimally helpful.  Trial off amlodipine  did not help swelling Has upcoming  new patient appt with vascular Dr. Pearline 11/16/2023 and Dr. Serene 12/03/2023     BP Readings from Last 3 Encounters:  10/24/23 98/60  10/23/23 90/60  09/06/23 90/60    Wt Readings from Last 3 Encounters:  10/24/23 239 lb 3.2 oz (108.5 kg)  10/23/23 239 lb (108.4 kg)  09/06/23 237 lb 8 oz (107.7 kg)    Relevant past medical, surgical, family and social history reviewed and updated as indicated. Interim medical history since our last visit reviewed. Allergies and medications reviewed and updated. Outpatient Medications Prior to Visit  Medication Sig Dispense Refill   atorvastatin  (LIPITOR) 10 MG tablet Take 10 mg by mouth daily.     furosemide  (LASIX ) 20 MG tablet TAKE 1 TABLET BY MOUTH EVERY DAY 90 tablet 1   gabapentin  (NEURONTIN ) 300 MG capsule Take 1 capsule (300 mg total) by mouth 2 (two) times  daily. 180 capsule 1   HYDROcodone -acetaminophen  (NORCO/VICODIN) 5-325 MG tablet Take 1 tablet by mouth every 6 (six) hours as needed for moderate pain (pain score 4-6). 30 tablet 0   meclizine  (ANTIVERT ) 25 MG tablet TAKE 1 TABLET (25 MG TOTAL) BY MOUTH DAILY AS NEEDED FOR DIZZINESS. 30 tablet 5   Multiple Vitamin (MULTIVITAMIN WITH MINERALS) TABS tablet Take 1 tablet by mouth at bedtime.     pantoprazole  (PROTONIX ) 40 MG tablet TAKE 1 TABLET BY MOUTH EVERY DAY 90 tablet 1   telmisartan -hydrochlorothiazide  (MICARDIS  HCT) 40-12.5 MG tablet TAKE 1 TABLET BY MOUTH EVERY DAY 90 tablet 2   amLODipine  (NORVASC ) 5 MG tablet TAKE 1 TABLET (5 MG TOTAL) BY MOUTH DAILY. 90 tablet 2   No facility-administered medications prior to visit.     Per HPI unless specifically indicated in ROS section below Review of Systems  Constitutional:  Negative for fatigue and fever.  HENT:  Negative for congestion.   Eyes:  Negative for pain.  Respiratory:  Negative for cough and shortness of breath.   Cardiovascular:  Positive for leg swelling. Negative for chest pain and palpitations.  Gastrointestinal:  Negative for abdominal pain.  Genitourinary:  Negative for dysuria and vaginal bleeding.  Musculoskeletal:  Positive for back pain.  Neurological:  Negative for syncope, light-headedness and headaches.  Psychiatric/Behavioral:  Negative for dysphoric mood.    Objective:  BP 98/60   Pulse 88   Temp 98.4 F (36.9 C) (Oral)   Ht 5' 6.75 (1.695 m)   Wt 239 lb 3.2 oz (108.5 kg)   SpO2 99%   BMI 37.75 kg/m   Wt Readings from Last 3 Encounters:  10/24/23 239 lb 3.2 oz (108.5 kg)  10/23/23 239 lb (108.4 kg)  09/06/23 237 lb 8 oz (107.7 kg)      Physical Exam Constitutional:      General: She is not in acute distress.    Appearance: Normal appearance. She is well-developed. She is not ill-appearing or toxic-appearing.  HENT:     Head: Normocephalic.     Right Ear: Hearing, tympanic membrane, ear canal and  external ear normal. Tympanic membrane is not erythematous, retracted or bulging.     Left Ear: Hearing, tympanic membrane, ear canal and external ear normal. Tympanic membrane is not erythematous, retracted or bulging.     Nose: No mucosal edema or rhinorrhea.     Right Sinus: No maxillary sinus tenderness or frontal sinus tenderness.     Left Sinus: No maxillary sinus tenderness or frontal sinus tenderness.     Mouth/Throat:     Pharynx: Uvula midline.  Eyes:     General: Lids are normal. Lids are everted, no foreign bodies appreciated.     Conjunctiva/sclera: Conjunctivae normal.     Pupils: Pupils are equal, round, and reactive to light.  Neck:     Thyroid : No thyroid  mass or thyromegaly.     Vascular: No carotid bruit.     Trachea: Trachea normal.  Cardiovascular:     Rate and Rhythm: Normal rate and regular rhythm.     Pulses: Normal pulses.     Heart sounds: Normal heart sounds, S1 normal and S2 normal. No murmur heard.    No friction rub. No gallop.     Comments: Bilateral lower legs tender to palpation Pulmonary:     Effort: Pulmonary effort is normal. No tachypnea or respiratory distress.     Breath sounds: Normal breath sounds. No decreased breath sounds, wheezing, rhonchi or rales.  Abdominal:     General: Bowel sounds are normal.     Palpations: Abdomen is soft.     Tenderness: There is no abdominal tenderness.  Musculoskeletal:     Cervical back: Normal range of motion and neck supple.     Right lower leg: 2+ Pitting Edema present.     Left lower leg: Pitting Edema present.  Skin:    General: Skin is warm and dry.     Findings: No rash.  Neurological:     Mental Status: She is alert.  Psychiatric:        Mood and Affect: Mood is not anxious or depressed.        Speech: Speech normal.        Behavior: Behavior normal. Behavior is cooperative.        Thought Content: Thought content normal.        Judgment: Judgment normal.       Results for orders placed or  performed in visit on 07/09/23  ECHOCARDIOGRAM COMPLETE   Collection Time: 07/09/23  2:59 PM  Result Value Ref Range   S' Lateral 2.50 cm   Area-P 1/2 3.60 cm2   Est EF 55 - 60%     Assessment and Plan  Peripheral  edema Assessment & Plan:  Chronic, multifactorial... likely due to combination of venous insufficiency, varicose veins, decreased activity given chronic back pain.  She has no clear cardiac cause with echocardiogram ejection fraction 55 to 60%. No hepatic issue.  Renal function slightly diminished with GFR 43. Lasix  20 mg daily has been minimally helpful and we are limited on increase given low blood pressure. She has failed compression hose, she has tried to wear is much as possible but they are difficult to get on.  We briefly discussed zipper compression hose. Given low blood pressure we will stop amlodipine  although this did not help swelling in the past. She has upcoming appointment with vascular for hopefully recommendations on improving venous flow.  I also asked that they consider lymphedema and possibility of lymphedema pumps assisting her.   Venous insufficiency  Primary hypertension Assessment & Plan:  Chronic, BP is low normal on current regimen.  Stop amlodipine  altogether.  Follow BP overtime.     Return in about 4 months (around 02/23/2024) for  , phone AMW,  fasting labs then CPE with me.   Greig Ring, MD

## 2023-10-24 NOTE — Assessment & Plan Note (Signed)
 Chronic, BP is low normal on current regimen.  Stop amlodipine  altogether.  Follow BP overtime.

## 2023-10-24 NOTE — Assessment & Plan Note (Signed)
 Chronic, multifactorial... likely due to combination of venous insufficiency, varicose veins, decreased activity given chronic back pain.  She has no clear cardiac cause with echocardiogram ejection fraction 55 to 60%. No hepatic issue.  Renal function slightly diminished with GFR 43. Lasix  20 mg daily has been minimally helpful and we are limited on increase given low blood pressure. She has failed compression hose, she has tried to wear is much as possible but they are difficult to get on.  We briefly discussed zipper compression hose. Given low blood pressure we will stop amlodipine  although this did not help swelling in the past. She has upcoming appointment with vascular for hopefully recommendations on improving venous flow.  I also asked that they consider lymphedema and possibility of lymphedema pumps assisting her.

## 2023-10-24 NOTE — Patient Instructions (Addendum)
 Stop amlodipine  altogether.   Ask vascular about lymphedema pumps.

## 2023-10-24 NOTE — Assessment & Plan Note (Signed)
 Had follow-up CT 6 months performed earlier today at [facility] urology.  Awaiting results.

## 2023-10-24 NOTE — Progress Notes (Signed)
 Pulmonary, Critical Care, and Sleep Medicine~Progress Note    Name: ROHINI JAROSZEWSKI MRN: [id number]   DOB: 12-Feb-1941 [facility]   Date: 10/24/2023 10:54 AM Admission: 10/21/2023     Impression Plan   Acute on chronic hypoxic resp failure  A fib  Hx of VTE in 2019. Treated with eliquis. Completed treatment. One time event  Abnormal CT scan: hx of mediastinal and left hilar LAD with patchy bilateral GGO. Has restrictive lung disease on PFTs. Query sarcoid?  PH; seen by ECHO since 2019.  Does not appear that she has had a RHC? If sarcoidosis could be group 1, if chronic VTE could be group 4, if untreated OSA could be group 3, does have known cardiac issues so likely has an element of group 2. Has progressed but does not appear to have progressed much since 2022  Thryoid nodule   Never smoker  At risk for OSA Scheduled for ECHO with bubble study  She is refusing CT imaging, which would help to evaluate Group 4, CTEPH   O2 titration above 90%  Will need outpatient sleep study  Follow up with Dr Marvis after discharge   Would benefit from a RHC once her volume status is optimize   Reviewed with attending   On xarelto  now      Daily Progression:    8/20  Feels the same    ESR 3  CRP 3  ACE 45    V/Q scan      FINDINGS:  Pulmonary perfusion is nonuniform, with bilateral few subsegmental defects. There are no well-defined wedge shaped segmental or subsegmental sized defects.     IMPRESSION:  Intermediate probability for pulmonary embolism.    8/19  Consult Note requested by hospitalist service     Ms. Hainsworth is a patient with a history of pulmonary hypertension presenting due to fluid retention. Found to have A fib on admission.    The patient takes a fluid pill (furosemide ) every other day and Tapazole  daily; as instructed by VCS. She was recently started on Xarelto  during this admission. Ms. Gupton uses home oxygen at 4 liters and takes Wixela, though the effectiveness of the latter is  uncertain. She has a history of blood clots in 2019, for which she completed a course of Eliquis. She has only had the on VTE event. She also denies any family history of sarcoidosis and a personal history of smoking. Currently no signs of infection, wheeze, or cough. She has never been tested for OSA, but is at risk.     Laboratory, Imaging, and Diagnostic Test Results  - Date: 10/23/23    - BNP: 5769  - Date: 10/22/23    - TSH 1.7, Free T4 1.5 (within normal limits)  - Date: 10/21/23    - Arterial Blood Gas: pH 7.49, pCO2 28.7, pO2 63, HCO3 21.9    - Date: 05/17/20    - CT scan: Scattered prominent but non-enlarged mediastinal lymph nodes (unchanged from 2021), scattered mosaic attenuation, small airways disease, few small sub-centimeter pulmonary nodules, retrosternal goiter  - Date: 10/521    - CTA: No blood clots, mediastinal lymphadenopathy in left hilar region, measuring about 1.5 cm    - Date: 05/03/20    - Pulmonary Function Test: FVC 1.6 liters (77% predicted), FEV1 1.24 liters (77% predicted), TLC 100% predicted, VC 74% predicted, RV 147% predicted, DLCO normalized when corrected towards alveolar volume    - Date: 10/22/23    - Echocardiogram: EF  66%, RV severely dilated, RA severely dilated, RVSP 72 mmHg  - Date: 03/22/20    - Echocardiogram: EF 60-65%, RV dilated, RA dilated, RVSP 70-75 mmHg, bubble study unable to perform  - Date: 06/08/17    - Echocardiogram: EF 60-65%, RV moderately dilated, RVSP 45 mmHg    Review of Systems  Respiratory: Positive for use of home oxygen at 4 liters.    I have reviewed the labs and previous day's notes.    Pertinent items are noted in HPI.  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    Diabetes (HCC)     Elevated BUN 09/13/2011    Finger lesion 03/02/2016    Gallbladder polyp 04/08/2013    Gastrointestinal disorder     GERD    GERD (gastroesophageal reflux disease)     Hypercholesterolemia     Hypertension     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    Microalbuminuria 08/29/2013    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, adult (HCC) 08/28/2011    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    On aspirin  at home 05/03/2015    Other unknown and unspecified cause of morbidity or mortality     hx of bronchitis    Rash, skin 09/25/2012    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) 03/02/2016    Slow transit constipation 08/13/2014    Umbilical hernia 05/08/2013      Past Surgical History:   Procedure Laterality Date    COLONOSCOPY N/A 04/23/2018    COLONOSCOPY AND ESOPHAGOGASTRODUODENOSCOPY (EGD) performed by [place] Carlus SQUIBB., MD at [facility] ENDOSCOPY    ORTHOPEDIC SURGERY  2000    screw in foot left    ORTHOPEDIC SURGERY  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    OTHER SURGICAL HISTORY      colonoscopy    TUBAL LIGATION        Prior to Admission medications   Medication Sig Start Date End Date Taking? Authorizing Provider   fluticasone-salmeterol (ADVAIR) 100-50 MCG/ACT AEPB diskus inhaler Inhale 1 puff into the lungs in the morning and 1 puff in the evening.   Yes [provider]   Lancets MISC Use BID. Dx: E11.9 03/27/17   Automatic Reconciliation, Ar   furosemide  (LASIX ) 40 MG tablet Take 1 tablet by mouth daily 12/16/19   Automatic Reconciliation, Ar   lisinopril (PRINIVIL;ZESTRIL) 10 MG tablet TAKE 1 TABLET EVERY DAY  Patient not taking: Reported on 10/22/2023 10/30/17   Automatic Reconciliation, Ar   metFORMIN (GLUCOPHAGE) 1000 MG tablet TAKE 1 TABLET TWICE DAILY WITH MEALS 09/13/18   Automatic Reconciliation, Ar   methIMAzole  (TAPAZOLE ) 5 MG tablet TAKE 1 TABLET EVERY DAY 08/16/18    Automatic Reconciliation, Ar   metoprolol  tartrate (LOPRESSOR ) 50 MG tablet TAKE 1 TABLET TWICE DAILY 09/23/18   Automatic Reconciliation, Ar   rosuvastatin  (CRESTOR ) 20 MG tablet TAKE 1 TABLET EVERY NIGHT 09/13/18   Automatic Reconciliation, Ar   triamterene -hydroCHLOROthiazide  (DYAZIDE ) 37.5-25 MG per capsule TAKE 1 CAPSULE EVERY  DAY 10/30/17   Automatic Reconciliation, Ar     Allergies   Allergen Reactions    Almond Oil Anaphylaxis, Hives and Itching    Macadamia Nut Oil Anaphylaxis    Other Anaphylaxis     All tree nuts       Social History     Tobacco Use    Smoking status: Former     Current packs/day: 0.00     Types: Cigarettes     Quit date: 06/03/1991     Years since quitting: 32.4    Smokeless tobacco: Never   Substance Use Topics    Alcohol use: No     Alcohol/week: 0.0 standard drinks of alcohol      Family History   Problem Relation Age of Onset    Heart Disease Mother         rheumatic fever    Diabetes Mother     Cancer Brother         throat    Breast Cancer Sister     Cancer Sister         colon & breast    Breast Cancer Mother     Cancer Father         lung     OBJECTIVE:     Vital Signs:     BP 125/77   Pulse 73   Temp 97.8 F (36.6 C) (Oral)   Resp 22   Ht 1.613 m (5' 3.5)   Wt 131.9 kg (290 lb 11.2 oz)   SpO2 94%   BMI 50.69 kg/m    Temp (24hrs), Avg:98 F (36.7 C), Min:97.4 F (36.3 C), Max:98.6 F (37 C)     Intake/Output:     Last shift: 08/20 0701 - 08/20 1900  In: -   Out: 1000 [Urine:1000]    Last 3 shifts: 08/18 1901 - 08/20 0700  In: 1860 [P.O.:1860]  Out: 4300 [Urine:4300]        Intake/Output Summary (Last 24 hours) at 10/24/2023 1054  Last data filed at 10/24/2023 0859  Gross per 24 hour   Intake 1160 ml   Output 4300 ml   Net -3140 ml       Physical Exam:                                        Exam Findings Other   General: No resp distress noted, appears stated age    HEENT:  No ulcers, JVD not elevated, no cervical LAD    Chest: No pectus deformity, normal chest rise b/l     HEART:  RRR, no murmurs/rubs/gallops    Lungs:  CTA b/l, no rhonchi/crackles/wheeze, diminished BS at bases    ABD: Soft/NT, non rigid mildly distended    EXT: No cyanosis/clubbing/edema, normal peripheral pulses    Skin: No rashes or ulcers, no mottling    Neuro: A/O x 3        Medications:  Current Facility-Administered Medications   Medication Dose Route Frequency    magnesium  sulfate 2000 mg in 50 mL IVPB premix  2,000 mg IntraVENous Once    furosemide  (LASIX ) injection 80 mg  80 mg IntraVENous BID AC    iopamidol  (ISOVUE -370) 76 % injection 100 mL  100 mL IntraVENous ONCE PRN    miconazole  nitrate 2 % ointment   Topical BID    balsum peru-castor oil (VENELEX) ointment  Topical BID    rivaroxaban  (XARELTO ) tablet 20 mg  20 mg Oral Dinner    [Held by provider] dilTIAZem  125 mg in sodium chloride  0.9 % 125 mL infusion  2.5-15 mg/hr IntraVENous Continuous    sodium chloride  flush 0.9 % injection 5-40 mL  5-40 mL IntraVENous 2 times per day    sodium chloride  flush 0.9 % injection 5-40 mL  5-40 mL IntraVENous PRN    0.9 % sodium chloride  infusion   IntraVENous PRN    potassium chloride  (KLOR-CON ) extended release tablet 40 mEq  40 mEq Oral PRN    Or    potassium bicarb-citric acid  (EFFER-K) effervescent tablet 40 mEq  40 mEq Oral PRN    Or    potassium chloride  10 mEq/100 mL IVPB (Peripheral Line)  10 mEq IntraVENous PRN    magnesium  sulfate 2000 mg in 50 mL IVPB premix  2,000 mg IntraVENous PRN    ondansetron  (ZOFRAN -ODT) disintegrating tablet 4 mg  4 mg Oral Q8H PRN    Or    ondansetron  (ZOFRAN ) injection 4 mg  4 mg IntraVENous Q6H PRN    polyethylene glycol (GLYCOLAX ) packet 17 g  17 g Oral Daily PRN    acetaminophen  (TYLENOL ) tablet 650 mg  650 mg Oral Q6H PRN    Or    acetaminophen  (TYLENOL ) suppository 650 mg  650 mg Rectal Q6H PRN    methIMAzole  (TAPAZOLE ) tablet 5 mg  5 mg Oral Daily    rosuvastatin  (CRESTOR ) tablet 20 mg  20 mg Oral Nightly    budesonide  (PULMICORT ) nebulizer suspension 250 mcg  0.25 mg  Nebulization BID RT    metoprolol  tartrate (LOPRESSOR ) tablet 25 mg  25 mg Oral BID       Labs:  ABG Invalid input(s): ABG     CBC Recent Labs     10/22/23  0416 10/23/23  0505 10/24/23  0622   WBC 3.7 4.2 4.2   HGB 13.9 13.6 13.5   HCT 42.4 42.1 40.3   PLT 94* 116* 121*   MCV 97.7 99.8* 96.2   MCH 32.0 32.2 32.2        Metabolic  Panel Recent Labs     10/21/23  1416 10/22/23  0416 10/23/23  0505 10/24/23  0622   NA 143 142 142 141   K 3.7 3.2* 3.9 3.3*   CL 107 106 106 102   CO2 19* 19* 22 25   BUN 17 18 19 19    MG  --  1.7 1.7 1.6   ALT <5*  --   --   --    INR  --  1.5*  --   --         Pertinent Labs                Bryan Goin Waddell, PA-C  10/24/2023

## 2023-10-24 NOTE — Other (Signed)
 HEART FAILURE NURSE NAVIGATOR NOTE  [facility]    Patient chart was reviewed by Heart Failure (HF) Nurse Navigators for compliance of prescribed treatment with guidelines directed medical therapy (GDMT) and HF database completed.     Please, review beneath recommendations for symptomatic patients with HF with Preserved Ejection Fraction >= 50% (HFpEF) for your consideration when taking care of this patient.    Current HF Medical Therapy:      Name Allison Newman   DOB Jan 13, 1941   LVEF 60/65%   NYHA Functional Class Documentation needed   ARNi/ACEi/ARB    Aldosterone Antagonist    SGLT2 inhibitor Spironolactone 25 mg daily   Consulting Cardiologist Dr. Maree (VCS)     Recommendations for HF Management:    For patients with HFpEF >= 50%, consider adding the following GDMT, if appropriate:  SGLT2 inhibitor [Class 2a]  ARNi or ARB [Class 2b]  Aldosterone antagonist [Class 2b]  Adjust antihypertensive therapy [Class 1]  Adjust diuretic dose at discharge if hospitalized for volume overload [Class 1]  For patient with hyperkalemia while on RAASi > 5.5, consider adding potassium binders (patiromer, sodium zirconium cycosilicate) [Class 2a]    Patients with suspected cardiac amyloidosis (older > 22 years old with LVH > 1.2cm and/or any other signs of amyloidosis) should be offered screening labs and imaging [Class 1]: (a) serum gammopathy profile and UPEP with immunofixation, and (b) PYP test. If PYP test is positive patient should have genetic testing done for inherited ATTR amyloidosis. If any findings are positive or you need genetic testing ordered, please, consider in-patient consultation or referral to [facility].      Note that the following medications may be potentially harmful in heart failure [Class 3].  Calcium  channel blockers (doxazosin, diltiazem , verapamil, nifedipine)  Antiarrhythmics (flecanide, disopyrimide, sotalol, dronedarone)  Diabetes  medications (thiasolidinediones, saxagliptin, alogliptin)  NSAIDs and COX 2 inhibitors    Consider vaccinations for respiratory illnesses (flu, pneumonia, covid) [Class 2b]    Due to h/o recurrent hospitalizations this patient may benefit from referral to Advanced Heart Failure Program to assess suitability for advanced therapies, such as left-ventricular assist device, heart transplantation, palliative inotropes or palliation [Class 1]. To obtain in-patient consultation or refer to [facility], call [phone]    Patient Heart Failure Education:     Heart failure education to be provided including information about medical therapy, lifestyle modifications, diet and fluid restrictions, physical activity.  Educational resources to be provided: "AHA Discharge packet for patients diagnosed with Heart Failure booklet; Signs/Symptoms magnet; Weight Calendar; [facility] flyer.  Information to be provided about HF support group.  Learning about self-care for Heart Failure on discharge instructions.   American Heart Association's Interactive Workbook Healthier Living with Heart Failure - Managing Symptoms and Reducing Risk (QR code for link to access added to Discharge Instructions.    Plan for Transitional Care:    Post discharge follow up phone call to be made within 48-72 hours of discharge.  Patient will follow-up with PCP.  Patient will follow-up with Primary Cardiologist.  Patient screened for obstructive sleep apnea and referred to Sleep Medicine.  Referral/follow-up with Cardiac Rehabilitation.  Referral/follow-up with Advanced Heart Failure Specialist.  Referral/follow-up with Palliative Care Specialist.        Heart Failure Nurse Navigator  Phone: [phone] / [phone]    *Recommendations listed above are based on the guidelines: 2022 AHA/ACC/HFSA Guideline for the Management of Heart  Failure: A Report of the [employer] of Cardiology/American Heart Association  Joint Committee on Clinical Practice Guidelines. Circulation 2022; [id number]. and 2017 AHA/ACC/HRS guideline for management of patients with ventricular arrhythmias and the prevention of sudden cardiac death: A Report of the [employer] of Cardiology/American Heart Association Task Force on Clinical Practice Guidelines and the Heart Rhythm Society. Heart Rhythm, Vol 15, No 10, October 2018 *Class of Recommendation: Class 1 (strong), Class 2a (moderate), Class 2b (weak), Class 3 (not recommended, potentially harmful)

## 2023-10-24 NOTE — Progress Notes (Signed)
 Cardiology Progress Note  10/24/2023    Admit Date: 10/21/2023  Admit Diagnosis: Hypoxemia [R09.02]  Acute respiratory distress [R06.03]  Atrial fibrillation with rapid ventricular response (HCC) [I48.91]  Atrial fibrillation, unspecified type (HCC) [I48.91]  CC:  AF, HF    Assessment/Recommendations:  Atrial fibrillation with RVR  New diagnosis   Self converted to NSR 8/18  Continue metoprolol   CHA2DSVASc score 6 suggesting high cardioembolic risk. Started Xarelto . Will need to monitor thrombocytopenia. No signs of bleeding currently  Acute on chronic hypoxic respiratory failure. 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 Preserved LVEF per echo this admission  Continue  furosemide  to 80 mg BID  Daily labs  Appreciate  pulmonology recommendations  VQ scan with Intermediate probability for pulmonary embolism. Intermediate probability for pulmonary embolism. Pulmonary perfusion is nonuniform, with bilateral few subsegmental defects. Consider CTA pulmonary arteries to assess for signs of CTEPH - will defer to pulmonology. On Xarelto  for anticoagulation currently   I discussed  RHC with her currently she prefers to continue medical treatment given her age. She will discuss further with her son.   Obtain limited echo with bubble study to assess for right to left shunt  COPD on home oxygen  Thrombocytopenia  Stable  HTN  DM II  Morbid obesity  Lymphedema      Thank you for this consult. We will continue to follow along. Please contact us  for any further questions or concerns.    Hospital problem list     Principal Problem:    Atrial fibrillation with rapid ventricular response (HCC)  Resolved Problems:    * No resolved hospital problems. *                                                        Subjective:     Denies dyspnea at rest. NO chest pain. Leg pain and swelling improving. Renal function pending today.    ROS: All other systems reviewed and  were negative other than mentioned above.                                            No change in family and social history from H&P/Consult note.    Objective:    Physical Exam:  24 hr VS reviewed, overall VSSAF  Temp (24hrs), Avg:98 F (36.7 C), Min:97.4 F (36.3 C), Max:98.6 F (37 C)    Patient Vitals for the past 8 hrs:   Pulse   10/24/23 0601 64   10/24/23 0402 65   10/24/23 0202 61    Patient Vitals for the past 8 hrs:   Resp   10/24/23 0334 22    Patient Vitals for the past 8 hrs:   BP   10/24/23 0659 134/73   10/24/23 0657 134/73   10/24/23 0334 116/70          Intake/Output Summary (Last 24 hours) at 10/24/2023 0815  Last data filed at 10/24/2023 0400  Gross per 24 hour   Intake 1160 ml   Output 3300 ml   Net -2140 ml       General: obese  HEENT: sclera anicteric, moist mucous membranes  Neck: supple, no  JVD   CV: regular rate and rhythm, distant heart sounds, no murmurs, rubs or gallops,  Lungs: no respiratory distress, lungs clear to auscultation   Abdomen: soft, non distended  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                             Current medications reviewed as noted below.    Telemetry independently reviewed : [x]   sinus    []   chronic afib     []   par afib    []   NSVT    ECG independently reviewed: []   NSR    []   no significant changes   [x]   no new ECG provided for review    No results for input(s): PH, PCO2, PO2 in the last 72 hours.  No results for input(s): CPK, CKMB, TROPONINI in the last 72 hours.  Recent Labs     10/21/23  1416 10/22/23  0416 10/23/23  0505 10/24/23  0622   NA 143 142 142  --    K 3.7 3.2* 3.9  --    CL 107 106 106  --    CO2 19* 19* 22  --    BUN 17 18 19   --    WBC 4.3 3.7 4.2 4.2   HGB 14.5 13.9 13.6 13.5   HCT 43.1 42.4 42.1 40.3   PLT 116* 94* 116*  121*     Recent Labs     10/21/23  1416   GLOB 4.0     Recent Labs     10/22/23  0416   INR 1.5*      No results for input(s): TIBC in the last 72 hours.    Invalid input(s): FE, PSAT, FERR   Lab Results   Component Value Date/Time    GLUCPOC 237 01/15/2018 10:37 AM    GLUCPOC 174 10/12/2017 11:24 AM       Current Facility-Administered Medications   Medication Dose Route Frequency    furosemide  (LASIX ) injection 80 mg  80 mg IntraVENous BID AC    iopamidol  (ISOVUE -370) 76 % injection 100 mL  100 mL IntraVENous ONCE PRN    miconazole  nitrate 2 % ointment   Topical BID    balsum peru-castor oil (VENELEX) ointment   Topical BID    rivaroxaban  (XARELTO ) tablet 20 mg  20 mg Oral Dinner    [Held by provider] dilTIAZem  125 mg in sodium chloride  0.9 % 125 mL infusion  2.5-15 mg/hr IntraVENous Continuous    sodium chloride  flush 0.9 % injection 5-40 mL  5-40 mL IntraVENous 2 times per day    sodium chloride  flush 0.9 % injection 5-40 mL  5-40 mL IntraVENous PRN    0.9 % sodium chloride  infusion   IntraVENous PRN    potassium chloride  (KLOR-CON ) extended release tablet 40 mEq  40 mEq Oral PRN    Or    potassium bicarb-citric acid  (EFFER-K) effervescent tablet 40 mEq  40 mEq Oral PRN    Or    potassium chloride  10 mEq/100 mL IVPB (Peripheral Line)  10 mEq IntraVENous PRN    magnesium  sulfate 2000 mg in 50 mL IVPB premix  2,000 mg IntraVENous PRN    ondansetron  (ZOFRAN -ODT) disintegrating tablet  4 mg  4 mg Oral Q8H PRN    Or    ondansetron  (ZOFRAN ) injection 4 mg  4 mg IntraVENous Q6H PRN    polyethylene glycol (GLYCOLAX ) packet 17 g  17 g Oral Daily PRN    acetaminophen  (TYLENOL ) tablet 650 mg  650 mg Oral Q6H PRN    Or    acetaminophen  (TYLENOL ) suppository 650 mg  650 mg Rectal Q6H PRN    methIMAzole  (TAPAZOLE ) tablet 5 mg  5 mg Oral Daily    rosuvastatin  (CRESTOR ) tablet 20 mg  20 mg Oral Nightly    budesonide  (PULMICORT ) nebulizer suspension 250 mcg  0.25 mg Nebulization BID RT    metoprolol  tartrate (LOPRESSOR )  tablet 25 mg  25 mg Oral BID         Abednego Yeates T. Maree, MD  General Cardiology  [facility]  10/24/23

## 2023-10-24 NOTE — Discharge Instructions (Signed)
 To access the American Heart Association's Interactive Workbook "Healthier Living with Heart Failure - Managing Symptoms and Reducing Risk"  Scan the QR code below.

## 2023-10-24 NOTE — Plan of Care (Signed)
 Problem: Physical Therapy - Adult  Goal: By Discharge: Performs mobility at highest level of function for planned discharge setting.  See evaluation for individualized goals.  Description: FUNCTIONAL STATUS PRIOR TO ADMISSION: Patient was modified independent using a rolling walker for functional mobility.    HOME SUPPORT PRIOR TO ADMISSION: The patient lived with family and reports able to perform basic ADLs.    Physical Therapy Goals  Initiated 10/24/2023  1.  Patient will move from supine to sit and sit to supine in bed with moderate assistance within 7 day(s).    2.  Patient will perform sit to stand with supervision/set-up within 7 day(s).  3.  Patient will transfer from bed to chair and chair to bed with supervision/set-up using the least restrictive device within 7 day(s).  4.  Patient will ambulate with contact guard assist for 50 feet with the least restrictive device within 7 day(s).       Outcome: Progressing     PHYSICAL THERAPY EVALUATION    Patient: Allison Newman (83 y.o. female)  Date: 10/24/2023  Primary Diagnosis: Hypoxemia [R09.02]  Acute respiratory distress [R06.03]  Atrial fibrillation with rapid ventricular response (HCC) [I48.91]  Atrial fibrillation, unspecified type (HCC) [I48.91]       Precautions:              ASSESSMENT :   DEFICITS/IMPAIRMENTS:   The patient is limited by decreased functional mobility, strength, body mechanics, activity tolerance, endurance, balance   Pt seen for PT evaluation after presenting to hospital with 2-3 weeks of increasing edema. Also with worsening weakness and not able to get out of bed. Pt received in bed, on 4 L O2 via nc, agreeable to participate with PT. Pt requires increased time to complete all functional mobility tasks, up to MaxA for bed mobility, and overall MinA x 2 for transfers. Pt positioned in chair for comfort at end of session.  At this time, recommending continued therapies upon discharge at next level of care.  Patient will benefit from  skilled intervention to address the above impairments.    Functional Outcome Measure:  The patient scored  13 on the AMPAC outcome measure. Cutoff score <=171,2,3 had higher odds of discharging home with home health or need of SNF/IPR.        PLAN :  Recommendations and Planned Interventions:   bed mobility training, transfer training, gait training, therapeutic exercises, and therapeutic activities    Frequency/Duration: Patient will be followed by physical therapy to address goals, PT Plan of Care: 5 times/week to address goals.      Recommendations for mobility with staff: Recommend that staff completes patient mobility with assist x2 using gait belt and rolling walker.    Recommendations for toileting with staff:  recommended toilet device: a bedside commode.       Recommendation for discharge: (in order for the patient to meet his/her long term goals):   Moderate intensity short-term skilled physical therapy up to 5x/week    Other factors to consider for discharge: concern for safely navigating or managing the home environment    IF patient discharges home will need the following DME: continuing to assess with progress                SUBJECTIVE:   Patient stated "Now hold on a minute."    OBJECTIVE DATA SUMMARY:       Past Medical History:   Diagnosis Date    Abdominal pain 05/08/2013    Abnormal  finding on EKG 11/17/2013    ACP (advance care planning) 05/03/2015    Acute pulmonary embolism (HCC) 06/08/2017    Arthritis     Chronic kidney disease (CKD), 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    Chronic pain of left knee 08/13/2014    Chronic right hip pain 03/17/2013    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    Diabetes (HCC)     Elevated BUN 09/13/2011    Finger lesion 03/02/2016    Gallbladder polyp 04/08/2013    Gastrointestinal disorder     GERD    GERD (gastroesophageal reflux disease)     Hypercholesterolemia     Hypertension     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    Microalbuminuria 08/29/2013    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, adult (HCC) 08/28/2011    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    On aspirin  at home 05/03/2015    Other unknown and unspecified cause of morbidity or mortality     hx of bronchitis    Rash, skin 09/25/2012    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) 03/02/2016    Slow transit constipation 08/13/2014    Umbilical hernia 05/08/2013     Past Surgical History:   Procedure Laterality Date    COLONOSCOPY N/A 04/23/2018    COLONOSCOPY AND ESOPHAGOGASTRODUODENOSCOPY (EGD) performed by [place] Carlus SQUIBB., MD at [facility] ENDOSCOPY    ORTHOPEDIC SURGERY  2000    screw in foot left    ORTHOPEDIC SURGERY  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    OTHER SURGICAL HISTORY      colonoscopy    TUBAL LIGATION         Home Situation:  Social/Functional History  Lives With: Family (brother and grandson)  Type of Home: House  Home Layout: Multi-level, Able to Live on Main level with bedroom/bathroom  Home Access: Level entry  Bathroom Shower/Tub:  (sponge bath at sink)  Bathroom Toilet: Handicap height  Bathroom Equipment: Grab bars around toilet  Home Equipment: Environmental consultant - Rolling, Cane, Oxygen  Has the patient had two or more falls in the past year or any fall with injury in the past year?: No  Prior Level of Assist for ADLs: Independent  Prior Level of Assist for Homemaking: Needs assistance  Prior Level of Assist for Transfers: Independent  Active Driver: No  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    Cognitive/Behavioral Status:  Orientation  Orientation Level: Oriented X4       Skin: Visible skin intact    Edema: BLE swelling noted     Hearing:    - WFL    Vision/Perceptual:        - WFL              Strength:    Strength: Generally decreased, functional    Tone & Sensation:      Sensation: Intact           Range Of Motion:  AROM: Generally decreased, functional  PROM: Generally decreased,  functional    Functional Mobility:  Bed Mobility:     Bed Mobility Training  Bed Mobility Training: Yes  Overall Level of Assistance: Substantial/Maximal assistance;2 Person assistance  Supine to Sit: Substantial/Maximal assistance;2 Person assistance (Pt requires MaxA x 2 for BLE management and to advance hips to EOB; able to move trunk from sidelying > sit with MinA)  Transfers:     Art therapist: Yes  Overall Level of Assistance: Minimal assistance;2 Person assistance  Interventions: Verbal cues  Sit to Stand: Minimal assistance;2 Person assistance  Stand Pivot Transfers: Minimal assistance;2 Person assistance  Bed to Chair: Minimal assistance;2 Person assistance  Balance:               Balance  Sitting: Impaired  Sitting - Static: Good (unsupported)  Sitting - Dynamic: Fair (occasional)  Standing: Impaired  Standing - Static: Constant support;Fair  Standing - Dynamic: Constant support;Fair                                                                                                                                                                                                                                                                                  [employer] AM-PAC      Basic Mobility Inpatient Short Form (6-Clicks) Version 2    How much help is needed turning from your back to your side while in a flat bed without using bedrails?: A Lot  How much help is needed moving from lying on your back to sitting on the side of a flat bed without using bedrails?: A Lot  How much help is needed moving to and from a bed to a chair?: A Little  How much help is needed standing up from a chair using your arms?: A Little  How much help is needed walking in hospital room?: A Lot  How much help is needed climbing 3-5 steps with a railing?: Total    AM-PAC Inpatient Mobility Raw Score : 13  AM-PAC Inpatient T-Scale Score : 36.74     Cutoff score <=171,2,3 had higher odds of discharging home  with home health or need of SNF/IPR.    1. Diane U. Jette, Ronal Broody, Vinoth K. Ranganathan, Sandra D. Passek, Gilmore RAMAN. Waldemar Dale EMERSON Aneta.  Validity of the AM-PAC "6-Clicks" Inpatient Daily Activity and Basic Mobility Short Forms. Physical Therapy Mar 2014, [phone]) [phone]; DOI: 10.2522/ptj.20130199  2. Warren M, Knecht J, Verheijde J, Tompkins J. Association of AM-PAC 6-Clicks Basic Mobility and Daily Activity Scores With Discharge Destination. Phys Ther.  2021 Apr 4;101(4):pzab043. doi: 10.1093/ptj/pzab043. PMID: 66482536.  3. Herbold J, Rajaraman D, Waddell RAMAN, Agayby K, Herriman S. Activity Measure for Post-Acute Care 6-Clicks Basic Mobility Scores Predict Discharge Destination After Acute Care Hospitalization in Select Patient Groups: A Retrospective, Observational Study. Arch Rehabil Res Clin Transl. 2022 Jul 16;4(3):100204. doi: [email or web address]/j.[email or web address].[id number]. PMID: 63876017; PMCID: [id number].  4. Aneta DELENA Darryle RAMAN, Coster W, Ni P. AM-PAC Short Forms Manual 4.0. Revised 04/2018.                                                                                                                                                                                                                               Pain Rating:  0/10       Activity Tolerance:   Fair     After treatment:   Patient left in no apparent distress sitting up in chair, Call bell within reach, and Bed/ chair alarm activated    COMMUNICATION/EDUCATION:   The patient's plan of care was discussed with: occupational therapist and registered nurse    Patient Education  Education Given To: Patient  Education Provided: Role of Therapy;Plan of Care;Mobility Art gallery manager;Fall Prevention Strategies  Education Method: Verbal  Barriers to Learning: None  Education Outcome: Continued education needed    Thank you for this referral.  Nat Raspberry, PT  Minutes: 27      Physical Therapy Evaluation Charge Determination   History Examination  Presentation Decision-Making   LOW Complexity : Zero comorbidities / personal factors that will impact the outcome / POC LOW Complexity : 1-2 Standardized tests and measures addressing body structure, function, activity limitation and / or participation in recreation  LOW Complexity : Stable, uncomplicated  AM-PAC  LOW    Based on the above components, the patient evaluation is determined to be of the following complexity level: Low

## 2023-10-24 NOTE — Plan of Care (Signed)
 Problem: Occupational Therapy - Adult  Goal: By Discharge: Performs self-care activities at highest level of function for planned discharge setting.  See evaluation for individualized goals.  Description: FUNCTIONAL STATUS PRIOR TO ADMISSION:  Patient was ambulatory using RW   , Prior Level of Assist for ADLs: Independent,  ,  ,  ,  ,  , Prior Level of Assist for Homemaking: Needs assistance,  , Prior Level of Assist for Transfers: Independent, Active Driver: No     HOME SUPPORT: Patient lived with family reports being independent with ADLS.    Occupational Therapy Goals:  Initiated 10/24/2023  1.  Patient will perform grooming with Contact Guard Assist in sitting within 7 day(s).  2.  Patient will perform bathing with Moderate Assist within 7 day(s).  3.  Patient will perform lower body dressing with Moderate Assist within 7 day(s).  4.  Patient will perform toilet transfers with Moderate Assist  within 7 day(s).  5.  Patient will perform all aspects of toileting with Moderate Assist within 7 day(s).  6.  Patient will participate in upper extremity therapeutic exercise/activities with Minimal Assist for 15 minutes within 7 day(s).    7.  Patient will utilize energy conservation techniques during functional activities with verbal cues within 7 day(s).   Outcome: Progressing   OCCUPATIONAL THERAPY EVALUATION    Patient: Allison Newman (83 y.o. female)  Date: 10/24/2023  Primary Diagnosis: Hypoxemia [R09.02]  Acute respiratory distress [R06.03]  Atrial fibrillation with rapid ventricular response (HCC) [I48.91]  Atrial fibrillation, unspecified type (HCC) [I48.91]         Precautions:                    ASSESSMENT :  The patient is limited by decreased functional mobility, independence in ADLs, high-level IADLs, ROM, strength, body mechanics, activity tolerance, endurance, safety awareness, coordination, balance, posture s/p admission for new a-fib rvr, COPD, without acute exacerbation. Prior to admission pt reports  living with family and being independent with ADLS, reports sponge bathing and using a standing recliner to sleep in.    On this date, pt was received semi-reclined in bed, agreeable to therapy, cleared by RN. Pt was received on 5L of O2 via NC. Pt required Max x2 for bed mobility with fair sitting balance. Min x2 for sit<>stand with RW, MAX A for perineal hygiene. Upon getting to chair, pt demonstrated decreased safety awareness with RW and getting to chair, requiring increased cues. Pt was left sitting in chair, vss, all needs met, call bell in reach. Will continue to follow in acute setting.        Functional Outcome Measure:  The patient scored 15/24 on the AMPAC outcome measure        PLAN :  Recommendations and Planned Interventions:   self care training, therapeutic activities, functional mobility training, balance training, therapeutic exercise, endurance activities, patient education, home safety training, and family training/education    Frequency/Duration: OT Plan of Care: 5 times/week    Recommendations for mobility with staff: Recommend that staff completes patient mobility with assist x2 using gait belt and rolling walker.    Recommendations for toileting with staff:  recommended toilet device: a bedside commode.     Recommendation for discharge: (in order for the patient to meet his/her long term goals):   Moderate intensity short-term skilled occupational therapy up to 5x/week    Other factors to consider for discharge: patient's current support system is unable to meet their requirements  for physical assistance, poor safety awareness, high risk for falls, not safe to be alone, and concern for safely navigating or managing the home environment    IF patient discharges home will need the following DME: continuing to assess with progress       SUBJECTIVE:   Patient stated, "just wait a minute."  OBJECTIVE DATA SUMMARY:     Past Medical History:   Diagnosis Date    Abdominal pain 05/08/2013    Abnormal  finding on EKG 11/17/2013    ACP (advance care planning) 05/03/2015    Acute pulmonary embolism (HCC) 06/08/2017    Arthritis     Chronic kidney disease (CKD), 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    Chronic pain of left knee 08/13/2014    Chronic right hip pain 03/17/2013    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    Diabetes (HCC)     Elevated BUN 09/13/2011    Finger lesion 03/02/2016    Gallbladder polyp 04/08/2013    Gastrointestinal disorder     GERD    GERD (gastroesophageal reflux disease)     Hypercholesterolemia     Hypertension     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    Microalbuminuria 08/29/2013    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, adult (HCC) 08/28/2011    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    On aspirin  at home 05/03/2015    Other unknown and unspecified cause of morbidity or mortality     hx of bronchitis    Rash, skin 09/25/2012    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) 03/02/2016    Slow transit constipation 08/13/2014    Umbilical hernia 05/08/2013     Past Surgical History:   Procedure Laterality Date    COLONOSCOPY N/A 04/23/2018    COLONOSCOPY AND ESOPHAGOGASTRODUODENOSCOPY (EGD) performed by [place] Carlus SQUIBB., MD at [facility] ENDOSCOPY    ORTHOPEDIC SURGERY  2000    screw in foot left    ORTHOPEDIC SURGERY  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    OTHER SURGICAL HISTORY      colonoscopy    TUBAL LIGATION            Expanded or extensive additional review of patient history:   Social/Functional History  Lives With: Family (brother and grandson)  Type of Home: House  Home Layout: Multi-level, Able to Live on Main level with bedroom/bathroom  Home Access: Level entry  Bathroom Shower/Tub:  (sponge bath at sink)  Bathroom Toilet: Handicap height  Bathroom Equipment: Grab bars around toilet  Home Equipment: Environmental consultant - Rolling, Cane, Oxygen  Has the patient had two or more falls in the past year or any fall with injury in the past  year?: No  Prior Level of Assist for ADLs: Independent  Prior Level of Assist for Homemaking: Needs assistance  Prior Level of Assist for Transfers: Independent  Active Driver: No  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          EXAMINATION OF PERFORMANCE DEFICITS:    Cognitive/Behavioral Status:  Orientation  Orientation Level: Oriented X4                   Range of Motion:   AROM: Generally decreased, functional         Strength:  Strength: Generally decreased, functional      Coordination:  Coordination: Generally decreased, functional  Tone & Sensation:   Tone: Normal  Sensation: Intact          Functional Mobility and Transfers for ADLs:    Bed Mobility:     Bed Mobility Training  Bed Mobility Training: Yes  Overall Level of Assistance: Substantial/Maximal assistance;2 Person assistance  Supine to Sit: Substantial/Maximal assistance;2 Person assistance (Pt requires MaxA x 2 for BLE management and to advance hips to EOB; able to move trunk from sidelying > sit with MinA)    Transfers:      Art therapist: Yes  Overall Level of Assistance: Minimal assistance;2 Person assistance  Interventions: Verbal cues  Sit to Stand: Minimal assistance;2 Person assistance  Stand Pivot Transfers: Minimal assistance;2 Person assistance  Bed to Chair: Minimal assistance;2 Person assistance                     Balance:      Balance  Sitting: Impaired  Sitting - Static: Good (unsupported)  Sitting - Dynamic: Fair (occasional)  Standing: Impaired  Standing - Static: Constant support;Fair  Standing - Dynamic: Constant support;Fair      ADL Assessment:          Feeding: Independent;Setup       Grooming: Contact guard assistance  Grooming Skilled Clinical Factors: in sitting    UE Bathing: Minimal assistance       Product Used : Bath wipes    LE Bathing: Maximum assistance       UE Dressing: Minimal assistance       LE Dressing: Maximum assistance       Toileting:  Maximum assistance                         ADL Intervention and task modifications:                  [employer] AM-PACTM 6 Clicks                                                       Daily Activity Inpatient Short Form  How much help from another person does the patient currently need... Total; A Lot A Little None   1.  Putting on and taking off regular lower body clothing? []   1 [x]   2 []   3 []   4   2.  Bathing (including washing, rinsing, drying)? []   1 [x]   2 []   3 []   4   3.  Toileting, which includes using toilet, bedpan or urinal? []  1 [x]   2 []   3 []   4   4.  Putting on and taking off regular upper body clothing? []   1 []   2 [x]   3 []   4   5.  Taking care of personal grooming such as brushing teeth? []   1 []   2 [x]   3 []   4   6.  Eating meals? []   1 []   2 [x]   3 []   4    2007, Trustees of [address], under license to [employer], [place]. All rights reserved     Score: 15/24     Interpretation of Tool:  Represents clinically-significant functional categories (i.e. Activities of daily living).    Cutoff score 39.4 (19) correlates to a good likelihood of discharging  home versus a facility  Diane U. Aneta, Ronal Broody, Vinoth K. Ranganathan, Sandra D. Passek, Gilmore RAMAN. Waldemar Dale EMERSON Aneta, AM-PAC "6-Clicks" Functional Assessment Scores Predict Acute Care Hospital Discharge Destination, Physical Therapy, Volume 94, Issue 9, 04 November 2012, Pages [phone], [email or web address]        Activity Tolerance:   Fair  and requires frequent rest breaks    After treatment:   Patient left in no apparent distress sitting up in chair, Call bell within reach, and Bed/ chair alarm activated    COMMUNICATION/EDUCATION:   The patient's plan of care was discussed with: physical therapist and registered nurse         Thank you for this referral.  Tawni Admire, OT  Minutes: 28    Occupational Therapy Evaluation Charge Determination   History Examination Decision-Making   LOW Complexity :  Brief history review  LOW Complexity: 1-3 Performance deficits relating to physical, cognitive, or psychosocial skills that result in activity limitations and/or participation restrictions LOW Complexity: No comorbidities that affect functional and  no verbal  or physical assist needed to complete eval tasks   Based on the above components, the patient evaluation is determined to be of the following complexity level: Low

## 2023-10-24 NOTE — Progress Notes (Signed)
 [facility]                                                                                          Hospitalist Progress Note  Allison Pizza, MD  Office Phone: [phone]        Date of Service:  10/24/2023  NAME:  [facility]ison [facility]  DOB:  1940-10-19  MRN:  [id number]       Admission Summary:   83 y.o. female who presents with above.  Patient came to the emergency room today for evaluation of 2 to 3 weeks of increasing edema.  She also had worsening weakness and was not able to get up out of bed, so finally agreed to let her son have her brought to the emergency room.  Patient at baseline lives at home with a different son and is responsible for her own medical care.  She does have diabetes and lymphedema, no longer following with a lymphedema clinic.  Patient says that she noticed over the last few weeks that her swelling in her legs has been getting worse.  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  Patient has oxygen at home that she uses sometimes and cannot really describe when she is supposed to be using oxygen.  Patient denies any cardiac history including CAD, CHF or arrhythmia.  Patient does state that she has intermittently had palpitations over the last year but none recently.  Patient denies chest pain.         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 Pt states she had a PE many years ago and was treated with a year of  anticoagulation.   -declined HRCT per pulm, RHC recommended but pt likely to refuse    Type 2 DM:  - SSI for now     Essential HTN:  - holding lisinopril, triameterene/HCT due to borderline BP on diltiazem  gtt  - con't home metoprolol  with hold parameters     Hyperthyroidism:  - con't methimazole      Hyperlipidemia: con't rosuvastatin      Lymphedema with increased LE swelling (?due to CHF):  - needs lymphedema clinic    Class III obesity          Code status: full  Prophylaxis: anticoagulated  Care Plan discussed with: pt rn cm  Anticipated Disposition:   Inpatient  Cardiac monitoring: Telemetry  Central Line:       CRITICAL CARE ATTESTATION:  I had a face to face encounter with the patient, reviewed  and interpreted patient data including clinical events, labs, images, vital signs, I/O's, and examined patient.  I have discussed the case and the plan and management of the patient's care with the consulting services, the bedside nurses and necessary ancillary providers.       NOTE OF PERSONAL INVOLVEMENT IN CARE   This patient has a high probability of imminent, clinically significant deterioration, which requires the highest level of preparedness to intervene urgently. I participated in the decision-making and personally managed or directed the management of the following life and organ supporting interventions that required my frequent assessment to treat or prevent imminent deterioration.     I personally spent 35 minutes of critical care time.  This is time spent at this critically ill patient's bedside actively involved in patient care as well as the coordination of care and discussions with the patient's family.  This does not include any procedural time which has been billed separately.           Social Drivers of Health     Tobacco Use: Medium Risk (05/22/2023)    Received from [facility]    Patient History     Smoking Tobacco Use: Former     Smokeless Tobacco Use: Never     Passive Exposure: Not on file    Alcohol Use: Not on file   Financial Resource Strain: Low Risk  (04/21/2021)    Received from [facility]    Overall Financial Resource Strain (CARDIA)     Difficulty of Paying Living Expenses: Not hard at all   Food Insecurity: No Food Insecurity (10/22/2023)    Hunger Vital Sign     Worried About Running Out of Food in the Last Year: Never true     Ran Out of Food in the Last Year: Never true   Transportation Needs: No Transportation Needs (10/22/2023)    PRAPARE - Therapist, art (Medical): No     Lack of Transportation (Non-Medical): No   Physical Activity: Not on file   Stress: Not on file   Social Connections: Not on file   Intimate Partner Violence: Not on file   Depression: Not at risk (07/19/2023)    Received from [facility]    PHQ-2     Patient Health Questionnaire-2 Score: 0   Housing Stability: Low Risk  (10/22/2023)    Housing Stability Vital Sign     Unable to Pay for Housing in the Last Year: No     Number of Times Moved in the Last Year: 0     Homeless in the Last Year: No   Interpersonal Safety: Not At Risk (10/22/2023)    Interpersonal Safety Domain Source: IP Abuse Screening     Physical abuse: Denies     Verbal abuse: Denies     Emotional abuse: Denies     Financial abuse: Denies     Sexual abuse: Denies   Utilities: Not At Risk (10/22/2023)    [facility] Utilities     Threatened with loss of utilities: No       Review of Systems:   A comprehensive review of systems was negative.       Vital Signs:    Last 24hrs VS reviewed since prior progress note. Most recent are:  Vitals:    10/24/23 1200   BP:    Pulse: 62   Resp:    Temp: 98.2 F (36.8 C)   SpO2:  Intake/Output Summary (Last 24 hours) at 10/24/2023 1227  Last data filed at 10/24/2023 0859  Gross per 24 hour   Intake 860 ml   Output 3800 ml   Net -2940 ml        Physical Examination:     I had a face to face encounter with this patient and independently examined them on 10/24/2023 as outlined below:          General :  alert x 3, awake, no acute distress,   HEENT: PEERL, EOMI, moist mucus membrane  Neck: supple, no JVD, no meningeal signs  Chest: Clear to auscultation bilaterally   CVS: rr nl rate  Abd: soft/ non tender, non distended, BS physiological,   Ext: no clubbing, no cyanosis, ++lymphedema  Neuro/Psych: grossly non-focal  Skin: warm     Data Review:    Review and/or order of clinical lab test  Review and/or order of tests in the radiology section of CPT  Review and/or order of tests in the medicine section of CPT      I have personally and independently reviewed all pertinent labs, diagnostic studies, imaging, and have provided independent interpretation of the same.     Labs:     Recent Labs     10/23/23  0505 10/24/23  0622   WBC 4.2 4.2   HGB 13.6 13.5   HCT 42.1 40.3   PLT 116* 121*     Recent Labs     10/22/23  0416 10/23/23  0505 10/24/23  0622   NA 142 142 141   K 3.2* 3.9 3.3*   CL 106 106 102   CO2 19* 22 25   BUN 18 19 19    MG 1.7 1.7 1.6     Recent Labs     10/21/23  1416   ALT <5*   GLOB 4.0     Recent Labs     10/22/23  0416   INR 1.5*      No results for input(s): TIBC in the last 72 hours.    Invalid input(s): FE, PSAT, FERR   No results found for: RBCF   No results for input(s): PH, PCO2, PO2 in the last 72 hours.  No results for input(s): CPK in the last 72 hours.    Invalid input(s): CPKMB, CKNDX, TROIQ  No results found for: CHOL, CHLST, CHOLV, HDL, HDLC, LDL, LDLC  Lab Results   Component Value Date/Time    GLUCPOC 237 01/15/2018 10:37 AM    GLUCPOC 174 10/12/2017 11:24 AM     @LABUA @    Notes reviewed from all clinical/nonclinical/nursing services involved in patient's clinical care. Care coordination discussions were held with appropriate clinical/nonclinical/ nursing providers based on care coordination needs.         Patients current active Medications were reviewed, considered, added and adjusted based on the clinical condition today.      Home Medications  were reconciled to the best of my ability given all available resources at the time of admission. Route is PO if not otherwise noted.      Admission Status:[id number]:::1}      Medications Reviewed:     Current Facility-Administered Medications   Medication Dose Route Frequency    spironolactone (ALDACTONE) tablet 25 mg  25 mg Oral Daily    furosemide  (LASIX ) injection 80 mg  80 mg IntraVENous BID AC    iopamidol  (ISOVUE -370) 76 % injection 100 mL  100 mL IntraVENous ONCE PRN    miconazole   nitrate 2 % ointment   Topical BID    balsum peru-castor oil (VENELEX) ointment   Topical BID    rivaroxaban  (XARELTO ) tablet 20 mg  20 mg Oral Dinner    [Held by provider] dilTIAZem  125 mg in sodium chloride  0.9 % 125 mL infusion  2.5-15 mg/hr IntraVENous Continuous    sodium chloride  flush 0.9 % injection 5-40 mL  5-40 mL IntraVENous 2 times per day    sodium chloride  flush 0.9 % injection 5-40 mL  5-40 mL IntraVENous PRN    0.9 % sodium chloride  infusion   IntraVENous PRN    potassium chloride  (KLOR-CON ) extended release tablet 40 mEq  40 mEq Oral PRN    Or    potassium bicarb-citric acid  (EFFER-K) effervescent tablet 40 mEq  40 mEq Oral PRN    Or    potassium chloride  10 mEq/100 mL IVPB (Peripheral Line)  10 mEq IntraVENous PRN    magnesium  sulfate 2000 mg in 50 mL IVPB premix  2,000 mg IntraVENous PRN    ondansetron  (ZOFRAN -ODT) disintegrating tablet 4 mg  4 mg Oral Q8H PRN    Or    ondansetron  (ZOFRAN ) injection 4 mg  4 mg IntraVENous Q6H PRN    polyethylene glycol (GLYCOLAX ) packet 17 g  17 g Oral Daily PRN    acetaminophen  (TYLENOL ) tablet 650 mg  650 mg Oral Q6H PRN    Or    acetaminophen  (TYLENOL ) suppository 650 mg  650 mg Rectal Q6H PRN    methIMAzole  (TAPAZOLE ) tablet 5 mg  5 mg Oral Daily    rosuvastatin  (CRESTOR ) tablet 20 mg  20 mg Oral Nightly    budesonide  (PULMICORT ) nebulizer suspension 250 mcg  0.25 mg Nebulization BID RT    metoprolol  tartrate (LOPRESSOR ) tablet 25 mg  25 mg Oral BID      ______________________________________________________________________  EXPECTED LENGTH OF STAY: 4  ACTUAL LENGTH OF STAY:          3                 Kali Ambler Al-Saadoon, MD

## 2023-10-24 NOTE — Plan of Care (Addendum)
 Problem: Safety - Adult  Goal: Free from fall injury  10/24/2023 1053 by Viviana Brasil, RN  Outcome: Progressing     Problem: Chronic Conditions and Co-morbidities  Goal: Patient's chronic conditions and co-morbidity symptoms are monitored and maintained or improved  10/24/2023 1053 by Viviana Brasil, RN  Outcome: Progressing     Problem: Discharge Planning  Goal: Discharge to home or other facility with appropriate resources  10/24/2023 1053 by Viviana Brasil, RN  Outcome: Progressing     Problem: Skin/Tissue Integrity  Goal: Skin integrity remains intact  Description: 1.  Monitor for areas of redness and/or skin breakdown  2.  Assess vascular access sites hourly  3.  Every 4-6 hours minimum:  Change oxygen saturation probe site  4.  Every 4-6 hours:  If on nasal continuous positive airway pressure, respiratory therapy assess nares and determine need for appliance change or resting period  10/24/2023 1053 by Viviana Brasil, RN  Outcome: Progressing     Bedside and Verbal shift change report given to Jeanice (oncoming nurse) by Ronnald (offgoing nurse). Report included the following information Nurse Handoff Report, Index, Intake/Output, MAR, and Cardiac Rhythm SR.

## 2023-10-25 ENCOUNTER — Inpatient Hospital Stay: Payer: Medicare (Managed Care) | Primary: Geriatric Medicine

## 2023-10-25 LAB — BASIC METABOLIC PANEL
Anion Gap: 13 mmol/L (ref 2–14)
BUN: 18 mg/dL (ref 8–23)
CO2: 27 mmol/L (ref 20–29)
Calcium: 8.3 mg/dL — ABNORMAL LOW (ref 8.8–10.2)
Chloride: 101 mmol/L (ref 98–107)
Creatinine: 0.95 mg/dL (ref 0.60–1.00)
Est, Glom Filt Rate: 60 ml/min/1.73m2 — ABNORMAL LOW (ref >90)
Glucose: 130 mg/dL — ABNORMAL HIGH (ref 65–100)
Potassium: 3.2 mmol/L — ABNORMAL LOW (ref 3.5–5.1)
Sodium: 141 mmol/L (ref 136–145)

## 2023-10-25 LAB — CBC
Hematocrit: 40.3 % (ref 35.0–47.0)
Hemoglobin: 13.3 g/dL (ref 11.5–16.0)
MCH: 31.4 pg (ref 26.0–34.0)
MCHC: 33 g/dL (ref 30.0–36.5)
MCV: 95.3 FL (ref 80.0–99.0)
MPV: 12.9 FL (ref 8.9–12.9)
Nucleated RBCs: 0 /100{WBCs}
Platelets: 121 K/uL — ABNORMAL LOW (ref 150–400)
RBC: 4.23 M/uL (ref 3.80–5.20)
RDW: 18 % — ABNORMAL HIGH (ref 11.5–14.5)
WBC: 3.6 K/uL (ref 3.6–11.0)
nRBC: 0 K/uL (ref 0.00–0.01)

## 2023-10-25 LAB — BRAIN NATRIURETIC PEPTIDE: NT Pro-BNP: 6886 pg/mL — ABNORMAL HIGH (ref 0–450)

## 2023-10-25 MED ORDER — FUROSEMIDE 40 MG PO TABS
40 | Freq: Two times a day (BID) | ORAL | Status: DC
Start: 2023-10-25 — End: 2023-10-25
  Administered 2023-10-25: 17:00:00 80 mg via ORAL

## 2023-10-25 MED ORDER — FUROSEMIDE 80 MG PO TABS
80 | ORAL_TABLET | Freq: Two times a day (BID) | ORAL | 0 refills | 30.00000 days | Status: AC
Start: 2023-10-25 — End: ?

## 2023-10-25 MED ORDER — SPIRONOLACTONE 25 MG PO TABS
25 | ORAL_TABLET | Freq: Every day | ORAL | 0 refills | 30.00000 days | Status: AC
Start: 2023-10-25 — End: ?

## 2023-10-25 MED ORDER — RIVAROXABAN 20 MG PO TABS
20 | ORAL_TABLET | Freq: Every day | ORAL | 1 refills | 90.00000 days | Status: AC
Start: 2023-10-25 — End: ?

## 2023-10-25 MED FILL — FUROSEMIDE 10 MG/ML IJ SOLN: 10 mg/mL | INTRAMUSCULAR | Qty: 8

## 2023-10-25 MED FILL — SPIRONOLACTONE 25 MG PO TABS: 25 mg | ORAL | Qty: 1

## 2023-10-25 MED FILL — FUROSEMIDE 40 MG PO TABS: 40 mg | ORAL | Qty: 2

## 2023-10-25 NOTE — Progress Notes (Signed)
Bedside shift change report given to Grace P. RN (oncoming nurse) by Lilyanne Mcquown RN (offgoing nurse). Report included the following information Nurse Handoff Report.

## 2023-10-25 NOTE — Progress Notes (Signed)
 Physician Progress Note      PATIENT:               [facility]ison [facility], Allison [facility]  CSN #:                  [id number]  DOB:                       13-Nov-1940  ADMIT DATE:       10/21/2023 1:55 PM  DISCH DATE:        10/25/2023 8:22 PM  RESPONDING  PROVIDER #:        Deitra LELON Sharps MD          QUERY TEXT:    Atrial fibrillation is documented in the medical record per PNs 8/19. Please   clarify the type:    The clinical indicators include:  -Atrial fibrillation with RVR  New diagnosis  -Self converted to NSR 8/18  -Continue metoprolol   -CHA2DSVASc score 6 suggesting high cardioembolic risk.  -Started Xarelto .  Options provided:  -- Paroxysmal  -- Permanent  -- Longstanding persistent  -- Other persistent  -- Other - I will add my own diagnosis  -- Disagree - Not applicable / Not valid  -- Disagree - Clinically unable to determine / Unknown  -- Refer to Clinical Documentation Reviewer    PROVIDER RESPONSE TEXT:    The patient's atrial fibrillation is paroxysmal.    Query created by: Ashley Rung on 10/23/2023 12:53 PM      Electronically signed by:  Deitra LELON Sharps MD 10/26/2023 8:29 AM

## 2023-10-25 NOTE — Plan of Care (Signed)
 Problem: Safety - Adult  Goal: Free from fall injury  Outcome: Progressing     Problem: Chronic Conditions and Co-morbidities  Goal: Patient's chronic conditions and co-morbidity symptoms are monitored and maintained or improved  Outcome: Progressing  Flowsheets (Taken 10/24/2023 2000)  Care Plan - Patient's Chronic Conditions and Co-Morbidity Symptoms are Monitored and Maintained or Improved: Monitor and assess patient's chronic conditions and comorbid symptoms for stability, deterioration, or improvement     Problem: Discharge Planning  Goal: Discharge to home or other facility with appropriate resources  Outcome: Progressing  Flowsheets (Taken 10/24/2023 2000)  Discharge to home or other facility with appropriate resources: Identify barriers to discharge with patient and caregiver     Problem: Skin/Tissue Integrity  Goal: Skin integrity remains intact  Description: 1.  Monitor for areas of redness and/or skin breakdown  2.  Assess vascular access sites hourly  3.  Every 4-6 hours minimum:  Change oxygen saturation probe site  4.  Every 4-6 hours:  If on nasal continuous positive airway pressure, respiratory therapy assess nares and determine need for appliance change or resting period  Outcome: Progressing  Flowsheets (Taken 10/24/2023 2000)  Skin Integrity Remains Intact: Monitor for areas of redness and/or skin breakdown

## 2023-10-25 NOTE — Discharge Summary (Signed)
 Physician Discharge Summary     Patient ID:    Allison [facility]  [id number]  83 y.o.  1940/11/05    Admit date: 10/21/2023    Discharge date and time: 10/25/2023 10:19 AM    Discharge condition: Stable    Admission HPI:  83 y.o. female who presents with above.  Patient came to the emergency room today for evaluation of 2 to 3 weeks of increasing edema.  She also had worsening weakness and was not able to get up out of bed, so finally agreed to let her son have her brought to the emergency room.  Patient at baseline lives at home with a different son and is responsible for her own medical care.  She does have diabetes and lymphedema, no longer following with a lymphedema clinic.  Patient says that she noticed over the last few weeks that her swelling in her legs has been getting worse.  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  Patient has oxygen at home that she uses sometimes and cannot really describe when she is supposed to be using oxygen.  Patient denies any cardiac history including CAD, CHF or arrhythmia.  Patient does state that she has intermittently had palpitations over the last year but none recently.  Patient denies chest pain.          Hospital Diagnoses and Treatment Rendered:   New atrial fibrillation with rapid ventricular response  Acute on chronic HFpEF, predominantly RV dysfunction  Severe pHTN and severe RV dysfunction. Preserved LVEF per echo this admission   - s/p diltiazem  drip  - self converted to NSR 10/22/23  - continue PO metoprolol   - now on Xarelto   - cardiology adjust diuretics now on lasix  80 bid, started aldactone; stop triamterene /hctz at dc  - pt declined RHC  - f/up echo with bubble was ordered but pt declined this morning  - f/up Dr. Maree in 2 weeks  - PT OT rec SNF but pt prefers HH     COPD without acute exacerbation  Chronic hypoxic resp failure on ~5L NC at home per pt  Severe pulm HTN,  cor pulmonale, newly dx  -V/Q with intermediate probability of PE. Pt states she had a PE many years ago and was treated with a year of anticoagulation.   -pulm consulted; pt declined HRCT to further eval; pt also declined RHC  -f/up Dr. Cooper OP     Type 2 DM    Essential HTN:  - BP wnl holding home lisinopril, triameterene/HCTZ  - con't home metoprolol , aldactone as above per cardiology     Hyperthyroidism:  - con't methimazole      Hyperlipidemia: con't rosuvastatin      Lymphedema  - needs lymphedema clinic     Class III obesity      Pending results: none    Follow up Care:    Avie True PEDLAR, MD in 1-2 weeks. Please call to set up an appointment shortly after discharge.    Follow-up Information       Follow up With Specialties Details Why Contact Info    Avie True PEDLAR, MD [facility] Medicine Schedule an appointment as soon as possible for a visit  [address] [place] [address]  [phone]      Cooper Alstrom, MD Pulmonary Disease, Critical Care Medicine Schedule an appointment as soon as possible for a visit  [address] [facility]  [address]  [address] [place] [address]  [phone]      Maree Bonnie DASEN, MD Cardiovascular Disease Schedule an appointment as soon as possible for a visit in 2 week(s)  [address]  [address]  [place] [address]  [phone]              Disposition:  Home with HH/PT/OT    ADDITIONAL CARE RECOMMENDATIONS: monitor BP and labs on diuretics    ACTIVITY: activity as tolerated    DIET:  cardiac diet and diabetic diet      Discharge Medications:      Medication List        START taking these medications      rivaroxaban  20 MG Tabs tablet  Commonly known as: XARELTO   Take 1 tablet by mouth Daily with supper     spironolactone 25 MG tablet  Commonly known as: ALDACTONE  Take 1 tablet by mouth daily  Start taking on: October 26, 2023            CHANGE how you take these medications      furosemide  80 MG tablet  Commonly known as: LASIX   Take 1 tablet by mouth 2 times daily  What changed:    medication strength  how much to take  when to take this            CONTINUE taking these medications      fluticasone-salmeterol 100-50 MCG/ACT Aepb diskus inhaler  Commonly known as: ADVAIR     Lancets Misc     metFORMIN 1000 MG tablet  Commonly known as: GLUCOPHAGE     methIMAzole  5 MG tablet  Commonly known as: TAPAZOLE      metoprolol  tartrate 50 MG tablet  Commonly known as: LOPRESSOR      rosuvastatin  20 MG tablet  Commonly known as: CRESTOR             STOP taking these medications      aspirin  81 MG EC tablet     Breo Ellipta 200-25 MCG/ACT Aepb inhaler  Generic drug: fluticasone furoate-vilanterol     calcium  carb-cholecalciferol 600-10 MG-MCG Tabs per tab     lisinopril 10 MG tablet  Commonly known as: PRINIVIL;ZESTRIL     meloxicam  7.5 MG tablet  Commonly known as: MOBIC      pantoprazole  40 MG tablet  Commonly known as: PROTONIX      triamterene -hydroCHLOROthiazide  37.5-25 MG per capsule  Commonly known as: DYAZIDE                Where to Get Your Medications        These medications were sent to [id number] [place], [place] - [address] - P [phone] [place] [phone]  [address], [place] [address]      Phone: [phone]   furosemide  80 MG tablet  rivaroxaban  20 MG Tabs tablet  spironolactone 25 MG tablet         Chronic Diagnoses:   Principal Problem:    Atrial fibrillation with rapid ventricular response (HCC)  Active Problems:    Acute on chronic heart failure with preserved ejection fraction (HFpEF) (HCC)  Resolved Problems:    * No resolved hospital problems. *      Code Status: Full Code No additional code details    Discharge Exam:  General:  Alert, cooperative, no distress  Lungs:   Clear to auscultation bilaterally, symmetric expansion, no respiratory distress  Heart:  Regular rate and rhythm  Abdomen:   Soft, non-tender, non-distended  Extremities: Mild edema  Neurologic: MAE. A&O    CONSULTATIONS: IP CONSULT TO CARDIOLOGY  IP CONSULT TO  PULMONOLOGY    Significant Diagnostic Studies:   10/21/2023: BUN 17 MG/DL (Ref range: 8 - 23 MG/DL); CO2 19 mmol/L (L; Ref range: 20 - 29 mmol/L); Hematocrit 43.1 % (Ref range: 35.0 - 47.0 %); Hemoglobin 14.5 g/dL (Ref range: 88.4 - 83.9 g/dL); Potassium 3.7 mmol/L (Ref range: 3.5 - 5.1 mmol/L); Sodium 143 mmol/L (Ref range: 136 - 145 mmol/L)  10/22/2023: BUN 18 MG/DL (Ref range: 8 - 23 MG/DL); CO2 19 mmol/L (L; Ref range: 20 - 29 mmol/L); Hematocrit 42.4 % (Ref range: 35.0 - 47.0 %); Hemoglobin 13.9 g/dL (Ref range: 88.4 - 83.9 g/dL); Potassium 3.2 mmol/L (L; Ref range: 3.5 - 5.1 mmol/L); Sodium 142 mmol/L (Ref range: 136 - 145 mmol/L)  Recent Labs     10/24/23  0622 10/25/23  0848   WBC 4.2 3.6   HGB 13.5 13.3   HCT 40.3 40.3   PLT 121* 121*     Recent Labs     10/23/23  0505 10/24/23  0622   NA 142 141   K 3.9 3.3*   CL 106 102   CO2 22 25   BUN 19 19   MG 1.7 1.6     No results for input(s): TP, GLOB, GGT in the last 72 hours.    Invalid input(s): SGOT, GPT, AP, TBIL, ALB, AML, AMYP, LPSE, HLPSE  No results for input(s): INR, APTT in the last 72 hours.    Invalid input(s): PTP   No results for input(s): TIBC in the last 72 hours.    Invalid input(s): FE, PSAT, FERR   No results for input(s): PH, PCO2, PO2 in the last 72 hours.  No results for input(s): CPK, CKMB, TROPONINI in the last 72 hours.  No components found for: [facility]    NM LUNG SCAN PERFUSION ONLY  Result Date: 10/23/2023  INDICATION:  Acute respiratory distress / Reason for exam:->please evaluate for chronic embolic, in the setting of PH. EXAM: Nuclear perfusion lung scan is performed with planar scintigraphic pulmonary imaging in multiple projections with four-point mCi Tc 70m MAA IV. COMPARISON: CXR, 10/21/2023. FINDINGS: Pulmonary perfusion is nonuniform, with bilateral few subsegmental defects. There are no well-defined wedge shaped segmental or subsegmental sized defects.     Intermediate probability  for pulmonary embolism. Electronically signed by REDELL CUNNING    Echo (TTE) complete (PRN contrast/bubble/strain/3D)  Result Date: 10/22/2023    Image quality is technically difficult. Technically difficult study due to patient's body habitus   Left Ventricle: The EF by visual approximation is 60 - 65%. Left ventricle is smaller than normal. Mildly increased wall thickness. Unable to assess wall motion.   Right Ventricle: Right ventricle is severely dilated. Severely reduced systolic function.   Tricuspid Valve: Mild regurgitation. Severely elevated RVSP, consistent with severe pulmonary hypertension. Estimated RVSP 72 mmHg.   Right Atrium: Right atrium is severely dilated.     XR CHEST PORTABLE  Result Date: 10/21/2023  EXAM:  XR CHEST PORTABLE INDICATION:   SOB COMPARISON: Chest radiograph 05/30/2022. FINDINGS: AP radiograph of the chest was obtained. No evidence of focal consolidation. No pleural effusion or pneumothorax. Unchanged cardiomegaly. Unchanged widening of the superior mediastinum, consistent with enlarged thyroid gland. Calcifications of the thoracic aorta. No acute osseous abnormalities.     No acute cardiopulmonary process. Unchanged cardiomegaly and evidence  of enlarged thyroid gland. Electronically signed by Wendelyn JINNY Hoops      Greater than 31 minutes were spent on discharge services.    Signed:  Deitra LELON Sharps, MD  10/25/2023  10:19 AM

## 2023-10-25 NOTE — Progress Notes (Signed)
 Pulmonary, Critical Care, and Sleep Medicine~Progress Note    Name: Allison Newman MRN: [id number]   DOB: [birth year] [facility]   Date: 10/25/2023 9:42 AM Admission: 10/21/2023     Impression Plan   Acute on chronic hypoxic resp failure  A fib  Hx of VTE in 2019. Treated with eliquis. Completed treatment. One time event  Abnormal CT scan: hx of mediastinal and left hilar LAD with patchy bilateral GGO. Has restrictive lung disease on PFTs. Query sarcoid?  PH; seen by ECHO since 2019.  Does not appear that she has had a RHC? If sarcoidosis could be group 1, if chronic VTE could be group 4, if untreated OSA could be group 3, does have known cardiac issues so likely has an element of group 2. Has progressed but does not appear to have progressed much since 2022  Thryoid nodule   Never smoker  At risk for OSA Scheduled for ECHO with bubble study, pending   She is refusing CT imaging, which would help to evaluate Group 4, CTEPH   O2 titration above 90%  Will need outpatient sleep study  Follow up with Dr Cooper after discharge, new patient for her   Ongoing volume optimization  Would benefit from a RHC once her volume status is optimize, but declining it    Reviewed with attending   On xarelto  now   Little more to offer from our aspect, can complete work up on outpatient basis      Daily Progression:    8/21  Noted that she has declined RHC as well  Feels about the same     8/20  Feels the same    ESR 3  CRP 3  ACE 45    V/Q scan      FINDINGS:  Pulmonary perfusion is nonuniform, with bilateral few subsegmental defects. There are no well-defined wedge shaped segmental or subsegmental sized defects.     IMPRESSION:  Intermediate probability for pulmonary embolism.    8/19  Consult Note requested by hospitalist service     Allison Newman is a patient with a history of pulmonary hypertension presenting due to fluid retention. Found to have A fib on admission.    The patient takes a fluid pill  (furosemide ) every other day and Tapazole  daily; as instructed by VCS. She was recently started on Xarelto  during this admission. [name] uses home oxygen at 4 liters and takes Wixela, though the effectiveness of the latter is uncertain. She has a history of blood clots in 2019, for which she completed a course of Eliquis. She has only had the on VTE event. She also denies any family history of sarcoidosis and a personal history of smoking. Currently no signs of infection, wheeze, or cough. She has never been tested for OSA, but is at risk.     Laboratory, Imaging, and Diagnostic Test Results  - Date: 10/23/23    - BNP: 5769  - Date: 10/22/23    - TSH 1.7, Free T4 1.5 (within normal limits)  - Date: 10/21/23    - Arterial Blood Gas: pH 7.49, pCO2 28.7, pO2 63, HCO3 21.9    - Date: 05/17/20    - CT scan: Scattered prominent but non-enlarged mediastinal lymph nodes (unchanged from 2021), scattered mosaic attenuation, small airways disease, few small sub-centimeter pulmonary nodules, retrosternal goiter  - Date: 10/521    - CTA: No blood clots, mediastinal lymphadenopathy in left hilar region, measuring about 1.5 cm    -  Date: 05/03/20    - Pulmonary Function Test: FVC 1.6 liters (77% predicted), FEV1 1.24 liters (77% predicted), TLC 100% predicted, VC 74% predicted, RV 147% predicted, DLCO normalized when corrected towards alveolar volume    - Date: 10/22/23    - Echocardiogram: EF 66%, RV severely dilated, RA severely dilated, RVSP 72 mmHg  - Date: 03/22/20    - Echocardiogram: EF 60-65%, RV dilated, RA dilated, RVSP 70-75 mmHg, bubble study unable to perform  - Date: 06/08/17    - Echocardiogram: EF 60-65%, RV moderately dilated, RVSP 45 mmHg    Review of Systems  Respiratory: Positive for use of home oxygen at 4 liters.    I have reviewed the labs and previous day's notes.    Pertinent items are noted in HPI.  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    Diabetes (HCC)     Elevated BUN 09/13/2011    Finger lesion 03/02/2016    Gallbladder polyp 04/08/2013    Gastrointestinal disorder     GERD    GERD (gastroesophageal reflux disease)     Hypercholesterolemia     Hypertension     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    Microalbuminuria 08/29/2013    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, adult (HCC) 08/28/2011    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    On aspirin  at home 05/03/2015    Other unknown and unspecified cause of morbidity or mortality     hx of bronchitis    Rash, skin 09/25/2012    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) 03/02/2016    Slow transit constipation 08/13/2014    Umbilical hernia 05/08/2013      Past Surgical History:   Procedure Laterality Date    COLONOSCOPY N/A 04/23/2018    COLONOSCOPY AND ESOPHAGOGASTRODUODENOSCOPY (EGD) performed by [place] Carlus SQUIBB., MD at [facility] ENDOSCOPY    ORTHOPEDIC SURGERY  2000    screw in foot left    ORTHOPEDIC SURGERY  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    OTHER SURGICAL HISTORY      colonoscopy    TUBAL LIGATION        Prior to Admission medications   Medication Sig Start Date End Date Taking? Authorizing Provider   fluticasone-salmeterol (ADVAIR) 100-50 MCG/ACT AEPB diskus inhaler Inhale 1 puff into the lungs in the morning and 1 puff in the evening.   Yes [provider]   Lancets MISC Use BID. Dx: E11.9 03/27/17   Automatic Reconciliation, Ar   furosemide  (LASIX ) 40 MG tablet Take 1 tablet by mouth daily 12/16/19   Automatic Reconciliation, Ar   lisinopril (PRINIVIL;ZESTRIL) 10 MG tablet TAKE 1 TABLET EVERY DAY  Patient not taking: Reported on 10/22/2023  10/30/17   Automatic Reconciliation, Ar   metFORMIN (GLUCOPHAGE) 1000 MG tablet TAKE 1 TABLET TWICE DAILY WITH MEALS 09/13/18   Automatic Reconciliation, Ar   methIMAzole  (TAPAZOLE ) 5 MG tablet TAKE 1 TABLET EVERY DAY 08/16/18   Automatic Reconciliation, Ar  metoprolol  tartrate (LOPRESSOR ) 50 MG tablet TAKE 1 TABLET TWICE DAILY 09/23/18   Automatic Reconciliation, Ar   rosuvastatin  (CRESTOR ) 20 MG tablet TAKE 1 TABLET EVERY NIGHT 09/13/18   Automatic Reconciliation, Ar   triamterene -hydroCHLOROthiazide  (DYAZIDE ) 37.5-25 MG per capsule TAKE 1 CAPSULE EVERY DAY 10/30/17   Automatic Reconciliation, Ar     Allergies   Allergen Reactions    Almond Oil Anaphylaxis, Hives and Itching    Macadamia Nut Oil Anaphylaxis    Other Anaphylaxis     All tree nuts       Social History     Tobacco Use    Smoking status: Former     Current packs/day: 0.00     Types: Cigarettes     Quit date: 06/03/1991     Years since quitting: 32.4    Smokeless tobacco: Never   Substance Use Topics    Alcohol use: No     Alcohol/week: 0.0 standard drinks of alcohol      Family History   Problem Relation Age of Onset    Heart Disease Mother         rheumatic fever    Diabetes Mother     Cancer Brother         throat    Breast Cancer Sister     Cancer Sister         colon & breast    Breast Cancer Mother     Cancer Father         lung     OBJECTIVE:     Vital Signs:     BP 90/60   Pulse 66   Temp 97.9 F (36.6 C) (Oral)   Resp 18   Ht 1.613 m (5' 3.5)   Wt (!) 136.2 kg (300 lb 4.3 oz)   SpO2 93%   BMI 52.36 kg/m    Temp (24hrs), Avg:98.1 F (36.7 C), Min:97.9 F (36.6 C), Max:98.2 F (36.8 C)     Intake/Output:     Last shift: 08/21 0701 - 08/21 1900  In: 50   Out: 800 [Urine:800]    Last 3 shifts: 08/19 1901 - 08/21 0700  In: 840 [P.O.:840]  Out: 5550 [Urine:5550]        Intake/Output Summary (Last 24 hours) at 10/25/2023 0942  Last data filed at 10/25/2023 0800  Gross per 24 hour   Intake 250 ml   Output 3550 ml   Net -3300 ml       Physical  Exam:                                        Exam Findings Other   General: No resp distress noted, appears stated age    HEENT:  No ulcers, JVD not elevated, no cervical LAD    Chest: No pectus deformity, normal chest rise b/l    HEART:  RRR, no murmurs/rubs/gallops    Lungs:  CTA b/l, no rhonchi/crackles/wheeze, diminished BS at bases    ABD: Soft/NT, non rigid mildly distended    EXT: No cyanosis/clubbing/edema, normal peripheral pulses    Skin: No rashes or ulcers, no mottling    Neuro: A/O x 3        Medications:  Current Facility-Administered Medications   Medication Dose Route Frequency    furosemide  (LASIX ) tablet 80 mg  80 mg Oral BID    spironolactone (ALDACTONE) tablet 25  mg  25 mg Oral Daily    iopamidol  (ISOVUE -370) 76 % injection 100 mL  100 mL IntraVENous ONCE PRN    miconazole  nitrate 2 % ointment   Topical BID    balsum peru-castor oil (VENELEX) ointment   Topical BID    rivaroxaban  (XARELTO ) tablet 20 mg  20 mg Oral Dinner    [Held by provider] dilTIAZem  125 mg in sodium chloride  0.9 % 125 mL infusion  2.5-15 mg/hr IntraVENous Continuous    sodium chloride  flush 0.9 % injection 5-40 mL  5-40 mL IntraVENous 2 times per day    sodium chloride  flush 0.9 % injection 5-40 mL  5-40 mL IntraVENous PRN    0.9 % sodium chloride  infusion   IntraVENous PRN    potassium chloride  (KLOR-CON ) extended release tablet 40 mEq  40 mEq Oral PRN    Or    potassium bicarb-citric acid  (EFFER-K) effervescent tablet 40 mEq  40 mEq Oral PRN    Or    potassium chloride  10 mEq/100 mL IVPB (Peripheral Line)  10 mEq IntraVENous PRN    magnesium  sulfate 2000 mg in 50 mL IVPB premix  2,000 mg IntraVENous PRN    ondansetron  (ZOFRAN -ODT) disintegrating tablet 4 mg  4 mg Oral Q8H PRN    Or    ondansetron  (ZOFRAN ) injection 4 mg  4 mg IntraVENous Q6H PRN    polyethylene glycol (GLYCOLAX ) packet 17 g  17 g Oral Daily PRN    acetaminophen  (TYLENOL ) tablet 650 mg  650 mg Oral Q6H PRN    Or    acetaminophen  (TYLENOL ) suppository 650 mg   650 mg Rectal Q6H PRN    methIMAzole  (TAPAZOLE ) tablet 5 mg  5 mg Oral Daily    rosuvastatin  (CRESTOR ) tablet 20 mg  20 mg Oral Nightly    budesonide  (PULMICORT ) nebulizer suspension 250 mcg  0.25 mg Nebulization BID RT    metoprolol  tartrate (LOPRESSOR ) tablet 25 mg  25 mg Oral BID       Labs:  ABG Invalid input(s): ABG     CBC Recent Labs     10/23/23  0505 10/24/23  0622   WBC 4.2 4.2   HGB 13.6 13.5   HCT 42.1 40.3   PLT 116* 121*   MCV 99.8* 96.2   MCH 32.2 32.2        Metabolic  Panel Recent Labs     10/23/23  0505 10/24/23  0622   NA 142 141   K 3.9 3.3*   CL 106 102   CO2 22 25   BUN 19 19   MG 1.7 1.6        Pertinent Labs                Encarnacion Bole Waddell, PA-C  10/25/2023

## 2023-10-25 NOTE — Plan of Care (Signed)
 Problem: Safety - Adult  Goal: Free from fall injury  10/25/2023 1645 by Viviana Brasil, RN  Outcome: Completed     Problem: Chronic Conditions and Co-morbidities  Goal: Patient's chronic conditions and co-morbidity symptoms are monitored and maintained or improved  10/25/2023 1645 by Viviana Brasil, RN  Outcome: Completed     Problem: Discharge Planning  Goal: Discharge to home or other facility with appropriate resources  10/25/2023 1645 by Viviana Brasil, RN  Outcome: Completed     Problem: Skin/Tissue Integrity  Goal: Skin integrity remains intact  Description: 1.  Monitor for areas of redness and/or skin breakdown  2.  Assess vascular access sites hourly  3.  Every 4-6 hours minimum:  Change oxygen saturation probe site  4.  Every 4-6 hours:  If on nasal continuous positive airway pressure, respiratory therapy assess nares and determine need for appliance change or resting period  10/25/2023 1645 by Viviana Brasil, RN  Outcome: Completed     Problem: Pain  Goal: Verbalizes/displays adequate comfort level or baseline comfort level  10/25/2023 1645 by Viviana Brasil, RN  Outcome: Completed

## 2023-10-25 NOTE — Care Coordination-Inpatient (Signed)
 Transition of Care Plan:    RUR: 15%  Prior Level of Functioning: independent living with family.  Patient uses [facility]  Disposition: home today with HH (see above)  EDD: Thurs 10/25/23    Follow up appointments: pcp/specialist  DME needed: none  Transportation at discharge: son to transport patient later today    IM/IMM Medicare/Tricare letter given: 1st letter 10/23/23  2nd letter 10/25/23  Caregiver Contact: son.family  Discharge Caregiver contacted prior to discharge?   Care Conference needed? no  Barriers to discharge:  none    Ngoc Detjen J. Markeeta Scalf, BSW, CRM  [phone]

## 2023-10-25 NOTE — Other (Signed)
 Heart Failure Nurse Navigator rounds completed.    HF NN provided introduction to self and nurse navigator role. [facility] Discharge Packet for Patients Diagnosed with Heart Failure booklet given to patient, along with weight calendar, signs/symptoms magnet, and card with QR codes for HF video resources.       Self-care topics discussed:    Heart failure diagnosis and disease process - pt states that Dr. Maree has told her all about her heart problems.  She states this is all new for her.    Daily weights - Patient educated on weighing themselves daily and reporting 3 lbs. weight gain overnight or 5 lbs. in a week to their cardiologist - pt says she does not have a scale and questions the need to weigh.  Patient states my swelling is lymphedema. Reinforced importance of tracking weight for fluid status. Although patient states she can get on a scale independently, HF NN is concerned for safety.  As patient will be having home health 3 x week per patient, HF NN advised that she should ask for assistance from [facility] or family that she lives with. Patient verbalized understanding.  Patient is provided a complimentary scale.    What to do if HF symptoms worsen -Signs and symptoms of heart failure     Sodium restricted diet (less than 2 g sodium per day) - When asked who does the cooking at home, patient states [employer] and [employer].  HF NN reinforced importance of following low sodium diet.  Patient states I don't eat any salt.  HF NN informed patient that restaurant foods are traditionally higher in sodium content, however patient adamantly disagrees.  Patient would benefit from nutrition consult, however with DC order already entered, dietitian likely could not see patient prior to discharge.     Medication compliance    Close follow up with PCP/Cardiologist    Advised patient that follow up appointment will be scheduled and placed on Discharge Instructions prior to discharge. Will continue to follow until discharge.          Time spent with patient discussing HF education: 15 minutes

## 2023-10-25 NOTE — Progress Notes (Addendum)
 Cardiology Progress Note  10/25/2023    Admit Date: 10/21/2023  Admit Diagnosis: Hypoxemia [R09.02]  Acute respiratory distress [R06.03]  Atrial fibrillation with rapid ventricular response (HCC) [I48.91]  Atrial fibrillation, unspecified type (HCC) [I48.91]  CC:  AF, HF    Assessment/Recommendations:  Atrial fibrillation with RVR  New diagnosis   Self converted to NSR 8/18  Continue metoprolol   CHA2DSVASc score 6 suggesting high cardioembolic risk. Started Xarelto . Will need to monitor thrombocytopenia. No signs of bleeding currently  Acute on chronic hypoxic respiratory failure. 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 Preserved LVEF per echo this admission  Change to PO furosemide  80 mg BID  Started spironolactone. Would stop triamterene -HCTZ at discharge.  Labs pending today  Appreciate  pulmonology recommendations  VQ scan with Intermediate probability for pulmonary embolism. Intermediate probability for pulmonary embolism. Pulmonary perfusion is nonuniform, with bilateral few subsegmental defects. Pt declined CTA. On Xarelto  for anticoagulation currently   Declined RHC, prefers medical management  Obtain limited echo with bubble study to assess for right to left shunt  COPD on home oxygen  Thrombocytopenia  Stable  HTN  DM II  Morbid obesity  Lymphedema    Stable from cardiac standpoint. Dispo per primary team. Follow up with me in 2 weeks.     Thank you for this consult. Please contact us  for any further questions or concerns.    Hospital problem list     Principal Problem:    Atrial fibrillation with rapid ventricular response (HCC)  Resolved Problems:    * No resolved hospital problems. *                                                        Subjective:     Denies dyspnea. LE edema is better. She declined CTA yesterday.     ROS: All other systems reviewed and were negative other than mentioned above.                                             No change in family and social history from H&P/Consult note.    Objective:    Physical Exam:  24 hr VS reviewed, overall VSSAF  Temp (24hrs), Avg:98.1 F (36.7 C), Min:97.9 F (36.6 C), Max:98.2 F (36.8 C)    Patient Vitals for the past 8 hrs:   Pulse   10/25/23 0743 72   10/25/23 0720 65   10/25/23 0559 70   10/25/23 0409 72   10/25/23 0403 75   10/25/23 0159 65    Patient Vitals for the past 8 hrs:   Resp   10/25/23 0743 16   10/25/23 0720 23   10/25/23 0403 20    Patient Vitals for the past 8 hrs:   BP   10/25/23 0743 90/60   10/25/23 0720 (!) 102/59   10/25/23 0403 106/62          Intake/Output Summary (Last 24 hours) at 10/25/2023 0810  Last data filed at 10/25/2023 0800  Gross per 24 hour   Intake 250 ml   Output 4550 ml   Net -4300 ml       General: obese  HEENT: sclera anicteric, moist mucous membranes  Neck: supple, no JVD   CV: regular rate and rhythm, distant heart sounds, no murmurs, rubs or gallops,  Lungs: no respiratory distress, lungs clear to auscultation   Abdomen: soft, non distended  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                             Current medications reviewed as noted below.    Telemetry independently reviewed : [x]   sinus    []   chronic afib     []   par afib    []   NSVT    ECG independently reviewed: []   NSR    []   no significant changes   [x]   no new ECG provided for review    No results for input(s): PH, PCO2, PO2 in the last 72 hours.  No results for input(s): CPK, CKMB, TROPONINI in the last 72 hours.  Recent Labs     10/23/23  0505 10/24/23  0622   NA 142 141   K 3.9 3.3*   CL 106 102   CO2 22 25   BUN 19 19   WBC 4.2 4.2   HGB 13.6 13.5   HCT 42.1 40.3   PLT 116* 121*     No results for input(s): TP, GLOB, GGT in the last 72  hours.    Invalid input(s): SGOT, GPT, AP, TBIL, TBILI, ALB, AML, AMYP, LPSE, HLPSE    No results for input(s): INR, APTT in the last 72 hours.    Invalid input(s): PTP     No results for input(s): TIBC in the last 72 hours.    Invalid input(s): FE, PSAT, FERR   Lab Results   Component Value Date/Time    GLUCPOC 237 01/15/2018 10:37 AM    GLUCPOC 174 10/12/2017 11:24 AM       Current Facility-Administered Medications   Medication Dose Route Frequency    furosemide  (LASIX ) tablet 80 mg  80 mg Oral BID    spironolactone (ALDACTONE) tablet 25 mg  25 mg Oral Daily    iopamidol  (ISOVUE -370) 76 % injection 100 mL  100 mL IntraVENous ONCE PRN    miconazole  nitrate 2 % ointment   Topical BID    balsum peru-castor oil (VENELEX) ointment   Topical BID    rivaroxaban  (XARELTO ) tablet 20 mg  20 mg Oral Dinner    [Held by provider] dilTIAZem  125 mg in sodium chloride  0.9 % 125 mL infusion  2.5-15 mg/hr IntraVENous Continuous    sodium chloride  flush 0.9 % injection 5-40 mL  5-40 mL IntraVENous 2 times per day    sodium chloride  flush 0.9 % injection 5-40 mL  5-40 mL IntraVENous PRN    0.9 % sodium chloride  infusion   IntraVENous PRN    potassium chloride  (KLOR-CON ) extended release tablet 40 mEq  40 mEq Oral PRN    Or    potassium bicarb-citric acid  (EFFER-K) effervescent tablet 40 mEq  40 mEq Oral PRN    Or    potassium chloride  10 mEq/100 mL IVPB (Peripheral Line)  10 mEq IntraVENous PRN    magnesium  sulfate 2000 mg in 50 mL IVPB premix  2,000 mg IntraVENous PRN  ondansetron  (ZOFRAN -ODT) disintegrating tablet 4 mg  4 mg Oral Q8H PRN    Or    ondansetron  (ZOFRAN ) injection 4 mg  4 mg IntraVENous Q6H PRN    polyethylene glycol (GLYCOLAX ) packet 17 g  17 g Oral Daily PRN    acetaminophen  (TYLENOL ) tablet 650 mg  650 mg Oral Q6H PRN    Or    acetaminophen  (TYLENOL ) suppository 650 mg  650 mg Rectal Q6H PRN    methIMAzole  (TAPAZOLE ) tablet 5 mg  5 mg Oral Daily    rosuvastatin  (CRESTOR ) tablet 20  mg  20 mg Oral Nightly    budesonide  (PULMICORT ) nebulizer suspension 250 mcg  0.25 mg Nebulization BID RT    metoprolol  tartrate (LOPRESSOR ) tablet 25 mg  25 mg Oral BID         Dewarren Ledbetter T. Maree, MD  General Cardiology  [place]  [facility]  10/25/23

## 2023-10-26 NOTE — Telephone Encounter (Signed)
 HF NN attempted to reach patient for post discharge follow up phone call. Left message with return call information, requesting return call.  No patient identifiers used.

## 2023-11-02 ENCOUNTER — Other Ambulatory Visit: Payer: Self-pay | Admitting: Family Medicine

## 2023-11-02 MED ORDER — HYDROCODONE-ACETAMINOPHEN 5-325 MG PO TABS: 1.0000 | ORAL_TABLET | Freq: Four times a day (QID) | ORAL | 0 refills | Status: DC | PRN

## 2023-11-02 NOTE — Telephone Encounter (Signed)
 Copied from CRM [id number]. Topic: Clinical - Medication Refill >> Nov 02, 2023 10:23 AM Jayma L wrote: Medication: HYDROcodone -acetaminophen  (NORCO/VICODIN) 5-325 MG tablet   Has the patient contacted their pharmacy? Yes (Agent: If no, request that the patient contact the pharmacy for the refill. If patient does not wish to contact the pharmacy document the reason why and proceed with request.) (Agent: If yes, when and what did the pharmacy advise?)  This is the patient's preferred pharmacy:  [facility] [phone] [address], [place] - [address] [place] [address] Phone: [phone] Fax: [phone]  Is this the correct pharmacy for this prescription? Yes If no, delete pharmacy and type the correct one.   Has the prescription been filled recently? No  Is the patient out of the medication? yes  Has the patient been seen for an appointment in the last year OR does the patient have an upcoming appointment? Yes  Can we respond through MyChart? Yes  Agent: Please be advised that Rx refills may take up to 3 business days. We ask that you follow-up with your pharmacy.

## 2023-11-06 DIAGNOSIS — M4316 Spondylolisthesis, lumbar region: Secondary | ICD-10-CM | POA: Diagnosis not present

## 2023-11-15 ENCOUNTER — Encounter

## 2023-11-15 ENCOUNTER — Encounter (HOSPITAL_COMMUNITY)

## 2023-11-15 NOTE — Progress Notes (Unsigned)
 Patient ID: Margaret Gordon, female   DOB: September 11, 1940, 83 y.o.   MRN: [id number]  Reason for Consult: No chief complaint on file.   Referred by Avelina Greig BRAVO, MD  Subjective:     HPI  Margaret Gordon is a 83 y.o. female who presents for evaluation of lower extremity swelling and discoloration Timeframe: 1 to 2 years Symptoms: Aching and heaviness Varicosities: No Previous wounds: No Previous DVT: No In compression: No  Past Medical History:  Diagnosis Date   Anemia    hx   Arthritis    Asthma    GERD (gastroesophageal reflux disease)    History of chicken pox    History of glaucoma    History of hiatal hernia    Hyperlipidemia    Hypertension    Migraines    Pneumonia    hx   Scoliosis    Family History  Problem Relation Age of Onset   Hyperlipidemia Mother    Hypertension Mother    Diabetes Mother    Cancer Mother 2   Breast cancer Mother    Hypertension Sister    Hyperlipidemia Sister    Heart disease Sister    Diabetes Sister    Kidney disease Sister    Arthritis Sister    Kidney disease Sister    Hypertension Sister    Hyperlipidemia Sister    Diabetes Sister    Diabetes Brother    Early death Brother    Arthritis Daughter    Past Surgical History:  Procedure Laterality Date   LUMBAR LAMINECTOMY/DECOMPRESSION MICRODISCECTOMY Left 11/02/2016   Procedure: Left Lumbar Three-Four, Lumbar Four-Five Laminectomy and Foraminotomy;  Surgeon: Joshua Alm RAMAN, MD;  Location: [facility] OR;  Service: Neurosurgery;  Laterality: Left;   TONSILLECTOMY     VESICOVAGINAL FISTULA CLOSURE W/ TAH      Short Social History:  Social History   Tobacco Use   Smoking status: Never    Passive exposure: Past   Smokeless tobacco: Former  Substance Use Topics   Alcohol use: Yes    Alcohol/week: 1.0 standard drink of alcohol    Types: 1 Glasses of wine per week    No Known Allergies  Current Outpatient Medications  Medication Sig Dispense Refill   atorvastatin   (LIPITOR) 10 MG tablet Take 10 mg by mouth daily.     furosemide  (LASIX ) 20 MG tablet TAKE 1 TABLET BY MOUTH EVERY DAY 90 tablet 1   gabapentin  (NEURONTIN ) 300 MG capsule Take 1 capsule (300 mg total) by mouth 2 (two) times daily. 180 capsule 1   HYDROcodone -acetaminophen  (NORCO/VICODIN) 5-325 MG tablet Take 1 tablet by mouth every 6 (six) hours as needed for moderate pain (pain score 4-6). 30 tablet 0   meclizine  (ANTIVERT ) 25 MG tablet TAKE 1 TABLET (25 MG TOTAL) BY MOUTH DAILY AS NEEDED FOR DIZZINESS. 30 tablet 5   Multiple Vitamin (MULTIVITAMIN WITH MINERALS) TABS tablet Take 1 tablet by mouth at bedtime.     pantoprazole  (PROTONIX ) 40 MG tablet TAKE 1 TABLET BY MOUTH EVERY DAY 90 tablet 1   telmisartan -hydrochlorothiazide  (MICARDIS  HCT) 40-12.5 MG tablet TAKE 1 TABLET BY MOUTH EVERY DAY 90 tablet 2   No current facility-administered medications for this visit.    REVIEW OF SYSTEMS All other systems were reviewed and are negative    Objective:  Objective   There were no vitals filed for this visit. There is no height or weight on file to calculate BMI.  Physical Exam General: no acute  distress Cardiac: hemodynamically stable Pulm: normal work of breathing Neuro: alert, no focal deficit Extremities: 2+ edema from feet to knee, left knee small telangiectasias, no varicosities, some staining of the medial aspect of both ankles, no previous wounds Vascular:   Right: palpable DP, PT  Left: palpable DP, PT   Data: Reflux study +--------------+---------+------+-----------+------------+--------+  LEFT         Reflux NoRefluxReflux TimeDiameter cmsComments                          Yes                                   +--------------+---------+------+-----------+------------+--------+  CFV          no                                              +--------------+---------+------+-----------+------------+--------+  FV prox       no                                               +--------------+---------+------+-----------+------------+--------+  FV mid        no                                              +--------------+---------+------+-----------+------------+--------+  FV dist       no                                              +--------------+---------+------+-----------+------------+--------+  Popliteal              yes   >1 second                       +--------------+---------+------+-----------+------------+--------+  GSV at [facility]    no                            0.85              +--------------+---------+------+-----------+------------+--------+  GSV prox thighno                            0.44              +--------------+---------+------+-----------+------------+--------+  GSV mid thigh           yes    >500 ms      0.43              +--------------+---------+------+-----------+------------+--------+  GSV dist thighno                            0.31              +--------------+---------+------+-----------+------------+--------+  GSV at knee   no  0.32              +--------------+---------+------+-----------+------------+--------+  GSV prox calf           yes    >500 ms      0.35              +--------------+---------+------+-----------+------------+--------+  GSV mid calf  no                            0.17              +--------------+---------+------+-----------+------------+--------+  GSV dist calf no                            0.35              +--------------+---------+------+-----------+------------+--------+  SSV prox calf no                            0.39              +--------------+---------+------+-----------+------------+--------+  SSV mid calf  no                            0.22              +--------------+---------+------+-----------+------------+--------+       Assessment/Plan:     SOLE LENGACHER is a 83 y.o. 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  She has medical grade and explains that they are too hard to get on she does not wear them.  We discussed that the best stockings are the ones that she will be able to wear as regularly as possible so I recommended trialing 20 mmHg to 15 mmHg with hopes that she would wear them more frequently. She was measured in the office today.  Follow-up as needed    Norman GORMAN Serve MD [facility]

## 2023-11-16 ENCOUNTER — Ambulatory Visit (HOSPITAL_COMMUNITY)
Admission: RE | Admit: 2023-11-16 | Discharge: 2023-11-16 | Disposition: A | Discharge status: Home / Self Care | Source: Ambulatory Visit | Attending: Vascular Surgery | Admitting: Vascular Surgery

## 2023-11-16 ENCOUNTER — Encounter: Payer: Self-pay | Admitting: Vascular Surgery

## 2023-11-16 ENCOUNTER — Ambulatory Visit (INDEPENDENT_AMBULATORY_CARE_PROVIDER_SITE_OTHER): Admitting: Vascular Surgery

## 2023-11-16 VITALS — BP 140/88 | HR 70 | Temp 98.0°F | Ht 66.75 in | Wt 242.0 lb

## 2023-11-16 DIAGNOSIS — I872 Venous insufficiency (chronic) (peripheral): Secondary | ICD-10-CM

## 2023-11-20 DIAGNOSIS — D3002 Benign neoplasm of left kidney: Secondary | ICD-10-CM | POA: Diagnosis not present

## 2023-11-20 DIAGNOSIS — D3001 Benign neoplasm of right kidney: Secondary | ICD-10-CM | POA: Diagnosis not present

## 2023-12-03 ENCOUNTER — Ambulatory Visit: Admitting: Surgery

## 2023-12-03 DIAGNOSIS — M7989 Other specified soft tissue disorders: Secondary | ICD-10-CM

## 2023-12-05 DEATH — deceased

## 2023-12-18 DIAGNOSIS — M48062 Spinal stenosis, lumbar region with neurogenic claudication: Secondary | ICD-10-CM | POA: Diagnosis not present

## 2024-01-10 ENCOUNTER — Other Ambulatory Visit: Payer: Self-pay | Admitting: Family Medicine

## 2024-01-23 ENCOUNTER — Telehealth: Payer: Self-pay | Admitting: *Deleted

## 2024-01-23 DIAGNOSIS — Z862 Personal history of diseases of the blood and blood-forming organs and certain disorders involving the immune mechanism: Secondary | ICD-10-CM

## 2024-01-23 DIAGNOSIS — E559 Vitamin D deficiency, unspecified: Secondary | ICD-10-CM

## 2024-01-23 DIAGNOSIS — Z833 Family history of diabetes mellitus: Secondary | ICD-10-CM

## 2024-01-23 DIAGNOSIS — E7800 Pure hypercholesterolemia, unspecified: Secondary | ICD-10-CM

## 2024-01-23 NOTE — Telephone Encounter (Signed)
-----   Message from Margaret Gordon sent at 01/23/2024  2:49 PM EST ----- Regarding: Labs Fri 02/08/24 Hello,  Patient is coming in for CPE labs. Can we get orders please.   Thanks

## 2024-01-25 ENCOUNTER — Other Ambulatory Visit: Payer: Self-pay | Admitting: Family Medicine

## 2024-01-25 MED ORDER — HYDROCODONE-ACETAMINOPHEN 5-325 MG PO TABS: 1.0000 | ORAL_TABLET | Freq: Four times a day (QID) | ORAL | 0 refills | Status: AC | PRN

## 2024-01-25 NOTE — Addendum Note (Signed)
 Addended by: WENDELL ARLAND RAMAN on: 01/25/2024 12:27 PM   Modules accepted: Orders

## 2024-01-25 NOTE — Telephone Encounter (Signed)
 Copied from CRM [id number]. Topic: Clinical - Prescription Issue >> Jan 25, 2024 11:47 AM Viola F wrote: Reason for CRM: Patient called to follow up on refill for the HYDROcodone -acetaminophen  (NORCO/VICODIN) 5-325 MG tablet - Initially requested 01/22/24 and it's passed the 3 business days. She would like a call today with an update at [phone] (M)

## 2024-01-25 NOTE — Telephone Encounter (Signed)
 Last office visit 10/24/2023 for Peripheral edema.  Last refilled 11/02/2023 for #30 with no refills. Next Appt: 02/15/2024.

## 2024-01-25 NOTE — Telephone Encounter (Signed)
 We do not have any documentation of a refill request being made on 01/22/24. Spoke with pt and made her aware of this and that I would send her request over to Dr. Avelina.

## 2024-02-08 ENCOUNTER — Other Ambulatory Visit: Payer: No Typology Code available for payment source

## 2024-02-11 ENCOUNTER — Other Ambulatory Visit

## 2024-02-11 ENCOUNTER — Ambulatory Visit: Payer: Self-pay | Admitting: Family Medicine

## 2024-02-11 DIAGNOSIS — Z862 Personal history of diseases of the blood and blood-forming organs and certain disorders involving the immune mechanism: Secondary | ICD-10-CM

## 2024-02-11 DIAGNOSIS — E559 Vitamin D deficiency, unspecified: Secondary | ICD-10-CM | POA: Diagnosis not present

## 2024-02-11 DIAGNOSIS — Z833 Family history of diabetes mellitus: Secondary | ICD-10-CM

## 2024-02-11 DIAGNOSIS — E7800 Pure hypercholesterolemia, unspecified: Secondary | ICD-10-CM

## 2024-02-11 LAB — CBC WITH DIFFERENTIAL/PLATELET
Basophils Absolute: 0 K/uL (ref 0.0–0.1)
Basophils Relative: 0.2 % (ref 0.0–3.0)
Eosinophils Absolute: 0.1 K/uL (ref 0.0–0.7)
Eosinophils Relative: 0.9 % (ref 0.0–5.0)
HCT: 36.8 % (ref 36.0–46.0)
Hemoglobin: 11.5 g/dL — ABNORMAL LOW (ref 12.0–15.0)
Lymphocytes Relative: 25.8 % (ref 12.0–46.0)
Lymphs Abs: 2 K/uL (ref 0.7–4.0)
MCHC: 31.3 g/dL (ref 30.0–36.0)
MCV: 71.2 fl — ABNORMAL LOW (ref 78.0–100.0)
Monocytes Absolute: 0.5 K/uL (ref 0.1–1.0)
Monocytes Relative: 6.6 % (ref 3.0–12.0)
Neutro Abs: 5.2 K/uL (ref 1.4–7.7)
Neutrophils Relative %: 66.5 % (ref 43.0–77.0)
Platelets: 175 K/uL (ref 150.0–400.0)
RBC: 5.17 Mil/uL — ABNORMAL HIGH (ref 3.87–5.11)
RDW: 15 % (ref 11.5–15.5)
WBC: 7.9 K/uL (ref 4.0–10.5)

## 2024-02-11 LAB — LIPID PANEL
Cholesterol: 157 mg/dL (ref 0–200)
HDL: 51.6 mg/dL (ref >39.00)
LDL Cholesterol: 88 mg/dL (ref 0–99)
NonHDL: 105.02
Total CHOL/HDL Ratio: 3
Triglycerides: 83 mg/dL (ref 0.0–149.0)
VLDL: 16.6 mg/dL (ref 0.0–40.0)

## 2024-02-11 LAB — COMPREHENSIVE METABOLIC PANEL WITH GFR
ALT: 10 U/L (ref 0–35)
AST: 20 U/L (ref 0–37)
Albumin: 3.9 g/dL (ref 3.5–5.2)
Alkaline Phosphatase: 106 U/L (ref 39–117)
BUN: 18 mg/dL (ref 6–23)
CO2: 36 meq/L — ABNORMAL HIGH (ref 19–32)
Calcium: 9.5 mg/dL (ref 8.4–10.5)
Chloride: 100 meq/L (ref 96–112)
Creatinine, Ser: 1.13 mg/dL (ref 0.40–1.20)
GFR: 45.61 mL/min — ABNORMAL LOW (ref >60.00)
Glucose, Bld: 90 mg/dL (ref 70–99)
Potassium: 4.1 meq/L (ref 3.5–5.1)
Sodium: 144 meq/L (ref 135–145)
Total Bilirubin: 0.7 mg/dL (ref 0.2–1.2)
Total Protein: 7.1 g/dL (ref 6.0–8.3)

## 2024-02-11 LAB — VITAMIN D 25 HYDROXY (VIT D DEFICIENCY, FRACTURES): VITD: 52.56 ng/mL (ref 30.00–100.00)

## 2024-02-11 LAB — HEMOGLOBIN A1C: Hgb A1c MFr Bld: 6.2 % (ref 4.6–6.5)

## 2024-02-11 LAB — IBC + FERRITIN
Ferritin: 295.8 ng/mL — ABNORMAL HIGH (ref 10.0–291.0)
Iron: 51 ug/dL (ref 42–145)
Saturation Ratios: 18.7 % — ABNORMAL LOW (ref 20.0–50.0)
TIBC: 273 ug/dL (ref 250.0–450.0)
Transferrin: 195 mg/dL — ABNORMAL LOW (ref 212.0–360.0)

## 2024-02-11 NOTE — Progress Notes (Signed)
 No critical labs need to be addressed urgently. We will discuss labs in detail at upcoming office visit.

## 2024-02-15 ENCOUNTER — Encounter: Payer: Self-pay | Admitting: Family Medicine

## 2024-02-15 ENCOUNTER — Ambulatory Visit (INDEPENDENT_AMBULATORY_CARE_PROVIDER_SITE_OTHER): Payer: No Typology Code available for payment source | Admitting: Family Medicine

## 2024-02-15 VITALS — BP 110/80 | HR 86 | Temp 98.9°F | Ht 66.5 in | Wt 233.1 lb

## 2024-02-15 DIAGNOSIS — N1831 Chronic kidney disease, stage 3a: Secondary | ICD-10-CM

## 2024-02-15 DIAGNOSIS — I10 Essential (primary) hypertension: Secondary | ICD-10-CM

## 2024-02-15 DIAGNOSIS — R5383 Other fatigue: Secondary | ICD-10-CM | POA: Diagnosis not present

## 2024-02-15 DIAGNOSIS — R0683 Snoring: Secondary | ICD-10-CM | POA: Insufficient documentation

## 2024-02-15 DIAGNOSIS — Z0000 Encounter for general adult medical examination without abnormal findings: Secondary | ICD-10-CM | POA: Diagnosis not present

## 2024-02-15 DIAGNOSIS — M5441 Lumbago with sciatica, right side: Secondary | ICD-10-CM | POA: Diagnosis not present

## 2024-02-15 DIAGNOSIS — D509 Iron deficiency anemia, unspecified: Secondary | ICD-10-CM | POA: Diagnosis not present

## 2024-02-15 DIAGNOSIS — E559 Vitamin D deficiency, unspecified: Secondary | ICD-10-CM

## 2024-02-15 DIAGNOSIS — G8929 Other chronic pain: Secondary | ICD-10-CM | POA: Diagnosis not present

## 2024-02-15 DIAGNOSIS — E7800 Pure hypercholesterolemia, unspecified: Secondary | ICD-10-CM

## 2024-02-15 DIAGNOSIS — E041 Nontoxic single thyroid nodule: Secondary | ICD-10-CM

## 2024-02-15 MED ORDER — TRIAMCINOLONE ACETONIDE 0.5 % EX CREA: 1.0000 | TOPICAL_CREAM | Freq: Two times a day (BID) | CUTANEOUS | 0 refills | Status: AC

## 2024-02-15 MED ORDER — ATORVASTATIN CALCIUM 10 MG PO TABS: 10.0000 mg | ORAL_TABLET | Freq: Every day | ORAL | 3 refills | Status: AC

## 2024-02-15 NOTE — Assessment & Plan Note (Signed)
 Chronic, some improvement status post epidural steroid injection.  gabapentin  300 mg BID

## 2024-02-15 NOTE — Assessment & Plan Note (Addendum)
 Chronic, well controlled on supplement

## 2024-02-15 NOTE — Assessment & Plan Note (Signed)
Stable, chronic.  Continue current medication.   Atorvastatin 10 mg daily 

## 2024-02-15 NOTE — Assessment & Plan Note (Addendum)
 Stable, chronic.  Continue current medication.  Telmisartan /hydrochlorothiazide  40/12.5 mg daily Lasix  20 mg daily as needed

## 2024-02-15 NOTE — Addendum Note (Signed)
 Addended by: ISADORA RAISIN on: 02/15/2024 10:49 AM   Modules accepted: Orders

## 2024-02-15 NOTE — Assessment & Plan Note (Signed)
 Chronic, some improvement On ARB

## 2024-02-15 NOTE — Patient Instructions (Addendum)
 Stop meclizine

## 2024-02-15 NOTE — Assessment & Plan Note (Signed)
 If fatigue not improving with decreasing gabapentin  and stopping meclizine ... consider sleep study.

## 2024-02-15 NOTE — Progress Notes (Signed)
 Patient ID: DEZIRAE SERVICE, female    DOB: Jun 13, 1940, 83 y.o.   MRN: [id number]  This visit was conducted in person.  BP 110/80   Pulse 86   Temp 98.9 F (37.2 C) (Temporal)   Ht 5' 6.5 (1.689 m)   Wt 233 lb 2 oz (105.7 kg)   SpO2 96%   BMI 37.06 kg/m    CC  Chief Complaint  Patient presents with   Annual Exam    Part 2 MWV 10/23/23     Subjective:   HPI: Margaret Gordon is a 83 y.o. female presenting on 02/15/2024 for   annual part 2  The patient presents for  complete physical and review of chronic health problems. He/She also has the following acute concerns today:   She has been feeling more tired overall.. When sitting down during the day she falls a asleep.  She snores.  Sleeps 98 hours, wakes rested.   Chronic low back pain.. now on gabapentin  BID, using hydrocodone  2 times a week. ESI helped low back pain.  The patient saw a LPN or RN for medicare wellness visit. 10/23/2023  Prevention and wellness was reviewed in detail. Note reviewed and important notes copied below.   Vit D def: Currently taking vitamin D  1000 units daily.. due for re-eval  History of anemia..  Hemoglobin stable at 11.5..  Iron levels remain slightly low.  Colonoscopy nml years ago.  Seeing Dr.  Burnetta Mace dermatologist for hair loss.  Hypertension:   Well controlled on  amlodipine  5 mg p.o. daily and telmisartan  hydrochlorothiazide  40/12.5 mg daily. She uses Lasix  20 mg daily as well. BP Readings from Last 3 Encounters:  02/15/24 110/80  11/16/23 (!) 140/88  10/24/23 98/60  Using medication without problems or lightheadedness: none Chest pain with exertion:one Edema:  stable, wearing compression hose Short of breath:none Average home BPs: Other issues:  Elevated Cholesterol:  At goal on atorvastatin  10 mg p.o. daily.. has not been taking this in last 3 weeks  Lab Results  Component Value Date   CHOL 157 02/11/2024   HDL 51.60 02/11/2024   LDLCALC 88 02/11/2024    TRIG 83.0 02/11/2024   CHOLHDL 3 02/11/2024  Using medications without problems: none Muscle aches:  none Exercise:  minimal given back pain and knees... plans water aerobic. Diet:  moderate  Relevant past medical, surgical, family and social history reviewed and updated as indicated. Interim medical history since our last visit reviewed. Allergies and medications reviewed and updated. Outpatient Medications Prior to Visit  Medication Sig Dispense Refill   ferrous sulfate 325 (65 FE) MG EC tablet Take 325 mg by mouth daily.     furosemide  (LASIX ) 20 MG tablet TAKE 1 TABLET BY MOUTH EVERY DAY 90 tablet 1   gabapentin  (NEURONTIN ) 300 MG capsule TAKE 1 CAPSULE BY MOUTH TWICE A DAY 180 capsule 1   HYDROcodone -acetaminophen  (NORCO/VICODIN) 5-325 MG tablet Take 1 tablet by mouth every 6 (six) hours as needed for moderate pain (pain score 4-6). 30 tablet 0   meclizine  (ANTIVERT ) 25 MG tablet TAKE 1 TABLET (25 MG TOTAL) BY MOUTH DAILY AS NEEDED FOR DIZZINESS. 30 tablet 5   Multiple Vitamin (MULTIVITAMIN WITH MINERALS) TABS tablet Take 1 tablet by mouth at bedtime.     pantoprazole  (PROTONIX ) 40 MG tablet TAKE 1 TABLET BY MOUTH EVERY DAY 90 tablet 1   telmisartan -hydrochlorothiazide  (MICARDIS  HCT) 40-12.5 MG tablet TAKE 1 TABLET BY MOUTH EVERY DAY 90 tablet 2  atorvastatin  (LIPITOR) 10 MG tablet Take 10 mg by mouth daily.     No facility-administered medications prior to visit.     Per HPI unless specifically indicated in ROS section below Review of Systems  Constitutional:  Negative for fatigue and fever.  HENT:  Negative for congestion.   Eyes:  Negative for pain.  Respiratory:  Negative for cough and shortness of breath.   Cardiovascular:  Negative for chest pain, palpitations and leg swelling.  Gastrointestinal:  Negative for abdominal pain.  Genitourinary:  Negative for dysuria and vaginal bleeding.  Musculoskeletal:  Negative for back pain.  Neurological:  Negative for syncope,  light-headedness and headaches.  Psychiatric/Behavioral:  Negative for dysphoric mood.    Objective:  BP 110/80   Pulse 86   Temp 98.9 F (37.2 C) (Temporal)   Ht 5' 6.5 (1.689 m)   Wt 233 lb 2 oz (105.7 kg)   SpO2 96%   BMI 37.06 kg/m   Wt Readings from Last 3 Encounters:  02/15/24 233 lb 2 oz (105.7 kg)  11/16/23 242 lb (109.8 kg)  10/24/23 239 lb 3.2 oz (108.5 kg)      Physical Exam Constitutional:      General: She is not in acute distress.    Appearance: Normal appearance. She is well-developed. She is not ill-appearing or toxic-appearing.  HENT:     Head: Normocephalic.     Right Ear: Hearing, tympanic membrane, ear canal and external ear normal. Tympanic membrane is not erythematous, retracted or bulging.     Left Ear: Hearing, tympanic membrane, ear canal and external ear normal. Tympanic membrane is not erythematous, retracted or bulging.     Nose: No mucosal edema or rhinorrhea.     Right Sinus: No maxillary sinus tenderness or frontal sinus tenderness.     Left Sinus: No maxillary sinus tenderness or frontal sinus tenderness.     Mouth/Throat:     Pharynx: Uvula midline.  Eyes:     General: Lids are normal. Lids are everted, no foreign bodies appreciated.     Conjunctiva/sclera: Conjunctivae normal.     Pupils: Pupils are equal, round, and reactive to light.  Neck:     Thyroid : No thyroid  mass or thyromegaly.     Vascular: No carotid bruit.     Trachea: Trachea normal.  Cardiovascular:     Rate and Rhythm: Normal rate and regular rhythm.     Pulses: Normal pulses.     Heart sounds: Normal heart sounds, S1 normal and S2 normal. No murmur heard.    No friction rub. No gallop.  Pulmonary:     Effort: Pulmonary effort is normal. No tachypnea or respiratory distress.     Breath sounds: Normal breath sounds. No decreased breath sounds, wheezing, rhonchi or rales.  Abdominal:     General: Bowel sounds are normal.     Palpations: Abdomen is soft.     Tenderness:  There is no abdominal tenderness.  Musculoskeletal:     Cervical back: Normal range of motion and neck supple.  Skin:    General: Skin is warm and dry.     Findings: No rash.  Neurological:     Mental Status: She is alert.  Psychiatric:        Mood and Affect: Mood is not anxious or depressed.        Speech: Speech normal.        Behavior: Behavior normal. Behavior is cooperative.        Thought Content:  Thought content normal.        Judgment: Judgment normal.       Results for orders placed or performed in visit on 02/11/24  IBC + Ferritin   Collection Time: 02/11/24  9:38 AM  Result Value Ref Range   Iron 51 42 - 145 ug/dL   Transferrin 804.9 (L) 212.0 - 360.0 mg/dL   Saturation Ratios 81.2 (L) 20.0 - 50.0 %   Ferritin 295.8 (H) 10.0 - 291.0 ng/mL   TIBC 273.0 250.0 - 450.0 mcg/dL  CBC with Differential/Platelet   Collection Time: 02/11/24  9:38 AM  Result Value Ref Range   WBC 7.9 4.0 - 10.5 K/uL   RBC 5.17 (H) 3.87 - 5.11 Mil/uL   Hemoglobin 11.5 (L) 12.0 - 15.0 g/dL   HCT 63.1 63.9 - 53.9 %   MCV 71.2 (L) 78.0 - 100.0 fl   MCHC 31.3 30.0 - 36.0 g/dL   RDW 84.9 88.4 - 84.4 %   Platelets 175.0 150.0 - 400.0 K/uL   Neutrophils Relative % 66.5 43.0 - 77.0 %   Lymphocytes Relative 25.8 12.0 - 46.0 %   Monocytes Relative 6.6 3.0 - 12.0 %   Eosinophils Relative 0.9 0.0 - 5.0 %   Basophils Relative 0.2 0.0 - 3.0 %   Neutro Abs 5.2 1.4 - 7.7 K/uL   Lymphs Abs 2.0 0.7 - 4.0 K/uL   Monocytes Absolute 0.5 0.1 - 1.0 K/uL   Eosinophils Absolute 0.1 0.0 - 0.7 K/uL   Basophils Absolute 0.0 0.0 - 0.1 K/uL  Comprehensive metabolic panel   Collection Time: 02/11/24  9:38 AM  Result Value Ref Range   Sodium 144 135 - 145 mEq/L   Potassium 4.1 3.5 - 5.1 mEq/L   Chloride 100 96 - 112 mEq/L   CO2 36 (H) 19 - 32 mEq/L   Glucose, Bld 90 70 - 99 mg/dL   BUN 18 6 - 23 mg/dL   Creatinine, Ser 8.86 0.40 - 1.20 mg/dL   Total Bilirubin 0.7 0.2 - 1.2 mg/dL   Alkaline Phosphatase 106  39 - 117 U/L   AST 20 0 - 37 U/L   ALT 10 0 - 35 U/L   Total Protein 7.1 6.0 - 8.3 g/dL   Albumin 3.9 3.5 - 5.2 g/dL   GFR 54.38 (L) >39.99 mL/min   Calcium  9.5 8.4 - 10.5 mg/dL  Lipid panel   Collection Time: 02/11/24  9:38 AM  Result Value Ref Range   Cholesterol 157 0 - 200 mg/dL   Triglycerides 16.9 0.0 - 149.0 mg/dL   HDL 48.39 >60.99 mg/dL   VLDL 83.3 0.0 - 59.9 mg/dL   LDL Cholesterol 88 0 - 99 mg/dL   Total CHOL/HDL Ratio 3    NonHDL 105.02   VITAMIN D  25 Hydroxy (Vit-D Deficiency, Fractures)   Collection Time: 02/11/24  9:38 AM  Result Value Ref Range   VITD 52.56 30.00 - 100.00 ng/mL  Hemoglobin A1c   Collection Time: 02/11/24  9:38 AM  Result Value Ref Range   Hgb A1c MFr Bld 6.2 4.6 - 6.5 %     COVID 19 screen:  No recent travel or known exposure to COVID19 The patient denies respiratory symptoms of COVID 19 at this time. The importance of social distancing was discussed today.   Assessment and Plan   The patient's preventative maintenance and recommended screening tests for an annual wellness exam were reviewed in full today. Brought up to date unless services declined.  Counselled  on the importance of diet, exercise, and its role in overall health and mortality. The patient's FH and SH was reviewed, including their home life, tobacco status, and drug and alcohol status.   Vaccines: Consider Shingrix. Uptodate with Td, flu, PNA and COVID x 5 Pap/DVE: not indicated Mammo: 07/2023 Bone Density: 01/07/2020 normal, repeat in 5 years Colon: last years ago. Smoking Status: Former smoker.. 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  gabapentin  300 mg BID      CKD stage 3a, GFR 45-59 ml/min (HCC)   Chronic, some improvement On ARB      High cholesterol (Chronic)    Stable, chronic.  Continue current medication.  Atorvastatin  10 mg daily       Relevant Medications   atorvastatin  (LIPITOR) 10 MG tablet   Hypertension (Chronic)   Stable, chronic.  Continue current medication.  Telmisartan /hydrochlorothiazide  40/12.5 mg daily Lasix  20 mg daily as needed        Relevant Medications   atorvastatin  (LIPITOR) 10 MG tablet   Iron deficiency anemia    Chronic.. noted in last 2 years.. slightly low, iron deficiency noted.  Remote colonoscopy.  No  bleeding noted.  She will test stool for blood loss with hemeoccult cards x 3.. if negative no further eval.    Start ferrous sulfate 325 mg daily.       Relevant Medications   ferrous sulfate 325 (65 FE) MG EC tablet   Other Relevant Orders   Fecal Occult Blood, Guaiac([place])   Morbid obesity (HCC) (Chronic)   Body mass index greater than 35 with multiple comorbidities including hypertension, high cholesterol      Other fatigue    Acute, labs eval unremarkable.  Stop meclizine . Decrease gabapentin  to 300 mg one tablet daily or one tablet at bedtime. Consider sleep eval if not improving.      Snoring    If fatigue not improving with decreasing gabapentin  and stopping meclizine ... consider sleep study.      Thyroid  nodule (Chronic)    S/P  Aspiration 2014.  Lab Results  Component Value Date   TSH 0.88 07/05/2023         Vitamin D  deficiency (Chronic)    Chronic, well controlled on supplement      Other Visit Diagnoses       Routine general medical examination at a health care facility    -  Primary          Greig Ring, MD

## 2024-02-15 NOTE — Assessment & Plan Note (Signed)
 Acute, labs eval unremarkable.  Stop meclizine . Decrease gabapentin  to 300 mg one tablet daily or one tablet at bedtime. Consider sleep eval if not improving.

## 2024-02-15 NOTE — Assessment & Plan Note (Signed)
 Body mass index greater than 35 with multiple comorbidities including hypertension, high cholesterol

## 2024-02-15 NOTE — Assessment & Plan Note (Signed)
 S/P  Aspiration 2014.  Lab Results  Component Value Date   TSH 0.88 07/05/2023

## 2024-02-15 NOTE — Assessment & Plan Note (Addendum)
 Chronic.. noted in last 2 years.. slightly low, iron deficiency noted.  Remote colonoscopy.  No  bleeding noted.  She will test stool for blood loss with hemeoccult cards x 3.. if negative no further eval.    Start ferrous sulfate 325 mg daily.

## 2024-02-26 ENCOUNTER — Encounter: Payer: Self-pay | Admitting: Pharmacist

## 2024-02-26 NOTE — Progress Notes (Signed)
 Pharmacy Quality Measure Review  This patient is appearing on a report for being at risk of failing the adherence measure for cholesterol (statin) medications this calendar year.   Medication: atorvastatin  10 mg Last fill date: 09/15/23 for 100 day supply  Insurance report was not up to date. No action needed at this time.  Medication has been refilled as of 02/15/24 x100 ds

## 2024-03-10 ENCOUNTER — Encounter: Payer: Self-pay | Admitting: Family Medicine

## 2024-10-28 ENCOUNTER — Ambulatory Visit
